# Patient Record
Sex: Female | Born: 1945 | Race: White | Hispanic: No | Marital: Married | Smoking: Former smoker
Health system: Southern US, Community
[De-identification: ages and names within clinical notes are randomized; demographics above are authoritative.]

## PROBLEM LIST (undated history)

## (undated) DIAGNOSIS — N390 Urinary tract infection, site not specified: Secondary | ICD-10-CM

## (undated) DIAGNOSIS — I10 Essential (primary) hypertension: Secondary | ICD-10-CM

## (undated) DIAGNOSIS — F329 Major depressive disorder, single episode, unspecified: Secondary | ICD-10-CM

## (undated) DIAGNOSIS — G4730 Sleep apnea, unspecified: Secondary | ICD-10-CM

## (undated) DIAGNOSIS — K5730 Diverticulosis of large intestine without perforation or abscess without bleeding: Secondary | ICD-10-CM

## (undated) DIAGNOSIS — J449 Chronic obstructive pulmonary disease, unspecified: Secondary | ICD-10-CM

## (undated) DIAGNOSIS — F32A Depression, unspecified: Secondary | ICD-10-CM

## (undated) DIAGNOSIS — I4711 Inappropriate sinus tachycardia, so stated: Secondary | ICD-10-CM

## (undated) DIAGNOSIS — M797 Fibromyalgia: Secondary | ICD-10-CM

## (undated) DIAGNOSIS — G8929 Other chronic pain: Secondary | ICD-10-CM

## (undated) DIAGNOSIS — E119 Type 2 diabetes mellitus without complications: Secondary | ICD-10-CM

## (undated) DIAGNOSIS — I6529 Occlusion and stenosis of unspecified carotid artery: Secondary | ICD-10-CM

## (undated) DIAGNOSIS — K2970 Gastritis, unspecified, without bleeding: Secondary | ICD-10-CM

## (undated) DIAGNOSIS — J189 Pneumonia, unspecified organism: Secondary | ICD-10-CM

## (undated) DIAGNOSIS — I509 Heart failure, unspecified: Secondary | ICD-10-CM

## (undated) DIAGNOSIS — D126 Benign neoplasm of colon, unspecified: Secondary | ICD-10-CM

## (undated) DIAGNOSIS — F419 Anxiety disorder, unspecified: Secondary | ICD-10-CM

## (undated) DIAGNOSIS — K644 Residual hemorrhoidal skin tags: Secondary | ICD-10-CM

## (undated) DIAGNOSIS — M1990 Unspecified osteoarthritis, unspecified site: Secondary | ICD-10-CM

## (undated) DIAGNOSIS — N200 Calculus of kidney: Secondary | ICD-10-CM

## (undated) DIAGNOSIS — I471 Supraventricular tachycardia: Secondary | ICD-10-CM

## (undated) DIAGNOSIS — E1149 Type 2 diabetes mellitus with other diabetic neurological complication: Secondary | ICD-10-CM

## (undated) DIAGNOSIS — A419 Sepsis, unspecified organism: Secondary | ICD-10-CM

## (undated) DIAGNOSIS — I4719 Other supraventricular tachycardia: Secondary | ICD-10-CM

## (undated) DIAGNOSIS — K219 Gastro-esophageal reflux disease without esophagitis: Secondary | ICD-10-CM

## (undated) DIAGNOSIS — E785 Hyperlipidemia, unspecified: Secondary | ICD-10-CM

## (undated) DIAGNOSIS — G709 Myoneural disorder, unspecified: Secondary | ICD-10-CM

## (undated) DIAGNOSIS — D649 Anemia, unspecified: Secondary | ICD-10-CM

## (undated) DIAGNOSIS — R000 Tachycardia, unspecified: Secondary | ICD-10-CM

## (undated) DIAGNOSIS — I480 Paroxysmal atrial fibrillation: Secondary | ICD-10-CM

## (undated) DIAGNOSIS — M549 Dorsalgia, unspecified: Secondary | ICD-10-CM

## (undated) DIAGNOSIS — R51 Headache: Secondary | ICD-10-CM

## (undated) HISTORY — DX: Occlusion and stenosis of unspecified carotid artery: I65.29

## (undated) HISTORY — DX: Dorsalgia, unspecified: M54.9

## (undated) HISTORY — DX: Essential (primary) hypertension: I10

## (undated) HISTORY — DX: Depression, unspecified: F32.A

## (undated) HISTORY — DX: Other chronic pain: G89.29

## (undated) HISTORY — DX: Paroxysmal atrial fibrillation: I48.0

## (undated) HISTORY — DX: Calculus of kidney: N20.0

## (undated) HISTORY — DX: Chronic obstructive pulmonary disease, unspecified: J44.9

## (undated) HISTORY — DX: Anxiety disorder, unspecified: F41.9

## (undated) HISTORY — DX: Unspecified osteoarthritis, unspecified site: M19.90

## (undated) HISTORY — DX: Benign neoplasm of colon, unspecified: D12.6

## (undated) HISTORY — DX: Sleep apnea, unspecified: G47.30

## (undated) HISTORY — DX: Residual hemorrhoidal skin tags: K64.4

## (undated) HISTORY — DX: Anemia, unspecified: D64.9

## (undated) HISTORY — DX: Gastritis, unspecified, without bleeding: K29.70

## (undated) HISTORY — DX: Hyperlipidemia, unspecified: E78.5

## (undated) HISTORY — DX: Diverticulosis of large intestine without perforation or abscess without bleeding: K57.30

## (undated) HISTORY — DX: Sepsis, unspecified organism: A41.9

## (undated) HISTORY — DX: Urinary tract infection, site not specified: N39.0

## (undated) HISTORY — PX: COLONOSCOPY W/ BIOPSIES AND POLYPECTOMY: SHX1376

## (undated) HISTORY — DX: Major depressive disorder, single episode, unspecified: F32.9

## (undated) HISTORY — DX: Pneumonia, unspecified organism: J18.9

## (undated) SURGERY — VIDEO BRONCHOSCOPY WITHOUT FLUORO
Anesthesia: Moderate Sedation

---
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
## 1976-06-02 HISTORY — PX: ABDOMINAL HYSTERECTOMY: SHX81

## 1995-06-03 HISTORY — PX: TOTAL KNEE ARTHROPLASTY: SHX125

## 1999-03-09 ENCOUNTER — Encounter: Payer: Self-pay | Admitting: Emergency Medicine

## 1999-03-09 ENCOUNTER — Emergency Department (HOSPITAL_COMMUNITY): Admission: EM | Admit: 1999-03-09 | Discharge: 1999-03-09 | Payer: Self-pay | Admitting: Emergency Medicine

## 1999-11-21 ENCOUNTER — Encounter: Admission: RE | Admit: 1999-11-21 | Discharge: 1999-11-21 | Payer: Self-pay | Admitting: Family Medicine

## 1999-11-21 ENCOUNTER — Encounter: Payer: Self-pay | Admitting: Family Medicine

## 2001-01-27 ENCOUNTER — Ambulatory Visit (HOSPITAL_BASED_OUTPATIENT_CLINIC_OR_DEPARTMENT_OTHER): Admission: RE | Admit: 2001-01-27 | Discharge: 2001-01-27 | Payer: Self-pay | Admitting: *Deleted

## 2001-07-09 ENCOUNTER — Encounter: Payer: Self-pay | Admitting: Family Medicine

## 2001-07-09 ENCOUNTER — Encounter: Admission: RE | Admit: 2001-07-09 | Discharge: 2001-07-09 | Payer: Self-pay | Admitting: Family Medicine

## 2001-09-05 ENCOUNTER — Encounter: Payer: Self-pay | Admitting: Family Medicine

## 2001-09-05 ENCOUNTER — Encounter: Admission: RE | Admit: 2001-09-05 | Discharge: 2001-09-05 | Payer: Self-pay | Admitting: Family Medicine

## 2003-04-24 ENCOUNTER — Encounter: Admission: RE | Admit: 2003-04-24 | Discharge: 2003-04-24 | Payer: Self-pay | Admitting: Family Medicine

## 2003-10-04 ENCOUNTER — Encounter: Admission: RE | Admit: 2003-10-04 | Discharge: 2003-10-04 | Payer: Self-pay | Admitting: Family Medicine

## 2003-10-10 ENCOUNTER — Encounter: Admission: RE | Admit: 2003-10-10 | Discharge: 2003-10-10 | Payer: Self-pay | Admitting: Family Medicine

## 2004-08-05 ENCOUNTER — Encounter: Admission: RE | Admit: 2004-08-05 | Discharge: 2004-08-05 | Payer: Self-pay | Admitting: Orthopedic Surgery

## 2004-08-20 ENCOUNTER — Encounter: Admission: RE | Admit: 2004-08-20 | Discharge: 2004-08-20 | Payer: Self-pay | Admitting: Orthopedic Surgery

## 2004-09-03 ENCOUNTER — Encounter: Admission: RE | Admit: 2004-09-03 | Discharge: 2004-09-03 | Payer: Self-pay | Admitting: Orthopedic Surgery

## 2005-11-12 ENCOUNTER — Encounter: Admission: RE | Admit: 2005-11-12 | Discharge: 2005-11-12 | Payer: Self-pay | Admitting: Family Medicine

## 2005-12-02 ENCOUNTER — Encounter: Admission: RE | Admit: 2005-12-02 | Discharge: 2006-03-02 | Payer: Self-pay | Admitting: Family Medicine

## 2006-04-06 ENCOUNTER — Ambulatory Visit: Payer: Self-pay | Admitting: Internal Medicine

## 2006-04-09 ENCOUNTER — Ambulatory Visit: Payer: Self-pay | Admitting: Internal Medicine

## 2006-04-09 ENCOUNTER — Encounter (INDEPENDENT_AMBULATORY_CARE_PROVIDER_SITE_OTHER): Payer: Self-pay | Admitting: Specialist

## 2006-04-09 HISTORY — PX: UPPER GASTROINTESTINAL ENDOSCOPY: SHX188

## 2007-04-05 ENCOUNTER — Ambulatory Visit: Payer: Self-pay | Admitting: Internal Medicine

## 2007-04-28 ENCOUNTER — Encounter: Payer: Self-pay | Admitting: Internal Medicine

## 2007-04-28 ENCOUNTER — Ambulatory Visit: Payer: Self-pay | Admitting: Internal Medicine

## 2007-04-28 DIAGNOSIS — Z860100 Personal history of colon polyps, unspecified: Secondary | ICD-10-CM | POA: Insufficient documentation

## 2007-06-14 ENCOUNTER — Ambulatory Visit: Payer: Self-pay | Admitting: Internal Medicine

## 2007-07-22 ENCOUNTER — Ambulatory Visit (HOSPITAL_COMMUNITY): Admission: RE | Admit: 2007-07-22 | Discharge: 2007-07-22 | Payer: Self-pay | Admitting: Gastroenterology

## 2007-07-28 ENCOUNTER — Ambulatory Visit: Payer: Self-pay | Admitting: Internal Medicine

## 2007-07-29 DIAGNOSIS — F329 Major depressive disorder, single episode, unspecified: Secondary | ICD-10-CM

## 2007-07-29 DIAGNOSIS — I10 Essential (primary) hypertension: Secondary | ICD-10-CM

## 2007-07-29 DIAGNOSIS — Z794 Long term (current) use of insulin: Secondary | ICD-10-CM

## 2007-07-29 DIAGNOSIS — E669 Obesity, unspecified: Secondary | ICD-10-CM

## 2007-07-29 DIAGNOSIS — N200 Calculus of kidney: Secondary | ICD-10-CM | POA: Insufficient documentation

## 2007-07-29 DIAGNOSIS — M549 Dorsalgia, unspecified: Secondary | ICD-10-CM

## 2007-07-29 DIAGNOSIS — E119 Type 2 diabetes mellitus without complications: Secondary | ICD-10-CM

## 2007-07-29 DIAGNOSIS — F411 Generalized anxiety disorder: Secondary | ICD-10-CM

## 2007-07-29 DIAGNOSIS — E1149 Type 2 diabetes mellitus with other diabetic neurological complication: Secondary | ICD-10-CM | POA: Insufficient documentation

## 2007-07-29 DIAGNOSIS — M1990 Unspecified osteoarthritis, unspecified site: Secondary | ICD-10-CM

## 2007-07-29 DIAGNOSIS — M810 Age-related osteoporosis without current pathological fracture: Secondary | ICD-10-CM | POA: Insufficient documentation

## 2007-09-06 ENCOUNTER — Inpatient Hospital Stay (HOSPITAL_COMMUNITY): Admission: EM | Admit: 2007-09-06 | Discharge: 2007-09-10 | Payer: Self-pay | Admitting: Emergency Medicine

## 2009-06-25 ENCOUNTER — Encounter: Payer: Self-pay | Admitting: Pulmonary Disease

## 2009-06-29 ENCOUNTER — Encounter: Payer: Self-pay | Admitting: Pulmonary Disease

## 2009-07-25 ENCOUNTER — Encounter: Payer: Self-pay | Admitting: Pulmonary Disease

## 2009-07-26 ENCOUNTER — Ambulatory Visit: Payer: Self-pay | Admitting: Pulmonary Disease

## 2009-07-26 DIAGNOSIS — J309 Allergic rhinitis, unspecified: Secondary | ICD-10-CM | POA: Insufficient documentation

## 2009-07-26 DIAGNOSIS — G4733 Obstructive sleep apnea (adult) (pediatric): Secondary | ICD-10-CM | POA: Insufficient documentation

## 2009-07-26 DIAGNOSIS — R0602 Shortness of breath: Secondary | ICD-10-CM | POA: Insufficient documentation

## 2009-08-13 ENCOUNTER — Ambulatory Visit: Payer: Self-pay | Admitting: Pulmonary Disease

## 2009-08-13 DIAGNOSIS — J438 Other emphysema: Secondary | ICD-10-CM | POA: Insufficient documentation

## 2009-08-29 ENCOUNTER — Encounter: Payer: Self-pay | Admitting: Pulmonary Disease

## 2010-02-22 ENCOUNTER — Ambulatory Visit (HOSPITAL_COMMUNITY): Admission: RE | Admit: 2010-02-22 | Discharge: 2010-02-22 | Payer: Self-pay | Admitting: Internal Medicine

## 2010-06-23 ENCOUNTER — Encounter: Payer: Self-pay | Admitting: Internal Medicine

## 2010-07-04 NOTE — Assessment & Plan Note (Signed)
Summary: rov for emphysema   Copy to:  Sandra Cockayne Primary Provider/Referring Provider:  Sandra Cockayne  CC:  Pt is here for a f/u appt to discuss PFT results.  .  History of Present Illness: the pt comes in today for f/u of her pfts, as part of a w/u for dyspnea.  She was found to have moderate obstructive disease, no restriction, and a severe decrease in dlco that corrects for alveolar volume adjustment.  I have reviewed the study with her in detail, and answered all of her questions.  The pt has not seen a big change in her breathing since being on the spiriva/symbicort combination for a longer period of time.  She denies any congestion or purulent mucus.  Current Medications (verified): 1)  Klor-Con 10 10 Meq Cr-Tabs (Potassium Chloride) .... Take 3 Tabs By Mouth Daily 2)  Lantus 100 Unit/ml Soln (Insulin Glargine) .... 80 Units Each Morning 3)  Metformin Hcl 500 Mg Tabs (Metformin Hcl) .... Take 2 Tabs By Mouth Two Times A Day 4)  Hydrocodone-Acetaminophen 5-500 Mg Tabs (Hydrocodone-Acetaminophen) .Marland Kitchen.. 1 Tab By Mouth Three Times A Day As Needed 5)  Gabapentin 600 Mg Tabs (Gabapentin) .... Take 3 Tabs By Mouth Two Times A Day As Needed 6)  Fluoxetine Hcl 20 Mg Caps (Fluoxetine Hcl) .... Take 3 Tabs By Mouth Each Morning 7)  Diovan 160 Mg Tabs (Valsartan) .... Take 1 Tablet By Mouth Once A Day 8)  Cyclobenzaprine Hcl 10 Mg Tabs (Cyclobenzaprine Hcl) .... Take 1 Tab By Mouth At Bedtime 9)  Chlorthalidone 25 Mg Tabs (Chlorthalidone) .... Take 1 Tablet By Mouth Once A Day 10)  Pravastatin Sodium 40 Mg Tabs (Pravastatin Sodium) .... Take 2  Tabs By Mouth At Bedtime 11)  Omeprazole 20 Mg Cpdr (Omeprazole) .... Take 1 Tab By Mouth At Bedtime 12)  Proair Hfa 108 (90 Base) Mcg/act  Aers (Albuterol Sulfate) .Marland Kitchen.. 1-2 Puffs Every 4-6 Hours As Needed 13)  Symbicort 160-4.5 Mcg/act  Aero (Budesonide-Formoterol Fumarate) .... Inhale 2 Puffs Two Times A Day 14)  Fosamax 70 Mg Tabs  (Alendronate Sodium) .... Take 1 Tab By Mouth Once A Week 15)  Loratadine 10 Mg Tabs (Loratadine) .... Take 1 Tablet By Mouth Once A Day 16)  Calcium 600 Mg Tabs (Calcium) .... Take 1 Tablet By Mouth Two Times A Day 17)  Docusate Sodium 100 Mg Caps (Docusate Sodium) .... Take Two Times A Day 18)  Multivitamins  Tabs (Multiple Vitamin) .... Take 1 Tablet By Mouth Once A Day 19)  Alprazolam 0.5 Mg Tabs (Alprazolam) .... Take 1/2 To 1 Tab By Mouth Once Daily As Needed 20)  Spiriva Handihaler 18 Mcg  Caps (Tiotropium Bromide Monohydrate) .... Inhale Contents of 1 Capsule Once A Day 21)  Apidra Solostar 100 Unit/ml Soln (Insulin Glulisine) .... 8 Units Before A Meal  Allergies (verified): 1)  ! Nsaids 2)  ! Aspirin  Review of Systems      See HPI  Vital Signs:  Patient profile:   65 year old female Height:      67 inches Weight:      244 pounds BMI:     38.35 O2 Sat:      91 % on Room air Temp:     97.9 degrees F oral BP sitting:   130 / 70  (left arm) Cuff size:   regular  Vitals Entered By: Arman Filter LPN (August 13, 2009 1:31 PM)  O2 Flow:  Room air CC:  Pt is here for a f/u appt to discuss PFT results.   Comments Medications reviewed with patient Arman Filter LPN  August 13, 2009 1:38 PM    Physical Exam  General:  obese female in nad   Impression & Recommendations:  Problem # 1:  EMPHYSEMA (ICD-492.8)  the pt has moderate emphysema on her pfts today, and I also suspect that her obesity and deconditioning are also playing roles.  It is unclear if she has a cardiac issue that could be contributing, but will leave that decision to Dr. Clelia Croft.  I have told her she is on the ":gold standard" with respect to medication.  There is really nothing that I would add to her regimen.  Would be happy to see her as needed if there is a change in her condition.    Medications Added to Medication List This Visit: 1)  Pravastatin Sodium 40 Mg Tabs (Pravastatin sodium) .... Take 2  tabs  by mouth at bedtime  Other Orders: Est. Patient Level III (16109)  Patient Instructions: 1)  stay on current meds 2)  work on weight loss and conditioning 3)  followup with me as needed.

## 2010-07-04 NOTE — Assessment & Plan Note (Signed)
Summary: consult for dyspnea   Copy to:  Sandra Cockayne Primary Provider/Referring Provider:  Sandra Cockayne  CC:  Pulmonary Consult.  History of Present Illness: The pt is a 65y/o female who I have been asked to see for dyspnea.  She has had breathing issues for years, but feels they have gotten worse the past few months.  She denies sob at rest, but does have less than one block doe on flat ground at a moderate pace.  She will get winded bringing in one bag of groceries from the car.  She has occasional dry cough, but no ongoing chest congestion.  She wears oxygen at HS, and found to have sat 85% at Dr. Alver Fisher office yesterday with exertion.  She has been on spiriva for one month, and symbicort was added yesterday.  She had a cxr yest. as well, with results not available currently.  The pt has no prior cardiac history, but has not had a screening w/u.  She has mild ankle edema that is intermittant.  Her weight is neutral over the last one year.  She has never had pfts.  Preventive Screening-Counseling & Management  Alcohol-Tobacco     Smoking Status: quit  Current Medications (verified): 1)  Klor-Con 10 10 Meq Cr-Tabs (Potassium Chloride) .... Take 3 Tabs By Mouth Daily 2)  Lantus 100 Unit/ml Soln (Insulin Glargine) .... 80 Units Each Morning 3)  Metformin Hcl 500 Mg Tabs (Metformin Hcl) .... Take 2 Tabs By Mouth Two Times A Day 4)  Hydrocodone-Acetaminophen 5-500 Mg Tabs (Hydrocodone-Acetaminophen) .Marland Kitchen.. 1 Tab By Mouth Three Times A Day As Needed 5)  Gabapentin 600 Mg Tabs (Gabapentin) .... Take 3 Tabs By Mouth Two Times A Day As Needed 6)  Fluoxetine Hcl 20 Mg Caps (Fluoxetine Hcl) .... Take 3 Tabs By Mouth Each Morning 7)  Diovan 160 Mg Tabs (Valsartan) .... Take 1 Tablet By Mouth Once A Day 8)  Cyclobenzaprine Hcl 10 Mg Tabs (Cyclobenzaprine Hcl) .... Take 1 Tab By Mouth At Bedtime 9)  Chlorthalidone 25 Mg Tabs (Chlorthalidone) .... Take 1 Tablet By Mouth Once A Day 10)   Pravastatin Sodium 40 Mg Tabs (Pravastatin Sodium) .... Take 1 Tab By Mouth At Bedtime 11)  Omeprazole 20 Mg Cpdr (Omeprazole) .... Take 1 Tab By Mouth At Bedtime 12)  Proair Hfa 108 (90 Base) Mcg/act  Aers (Albuterol Sulfate) .Marland Kitchen.. 1-2 Puffs Every 4-6 Hours As Needed 13)  Symbicort 160-4.5 Mcg/act  Aero (Budesonide-Formoterol Fumarate) .... Inhale 2 Puffs Two Times A Day 14)  Fosamax 70 Mg Tabs (Alendronate Sodium) .... Take 1 Tab By Mouth Once A Week 15)  Loratadine 10 Mg Tabs (Loratadine) .... Take 1 Tablet By Mouth Once A Day 16)  Calcium 600 Mg Tabs (Calcium) .... Take 1 Tablet By Mouth Two Times A Day 17)  Docusate Sodium 100 Mg Caps (Docusate Sodium) .... Take Two Times A Day 18)  Multivitamins  Tabs (Multiple Vitamin) .... Take 1 Tablet By Mouth Once A Day 19)  Alprazolam 0.5 Mg Tabs (Alprazolam) .... Take 1/2 To 1 Tab By Mouth Once Daily As Needed 20)  Spiriva Handihaler 18 Mcg  Caps (Tiotropium Bromide Monohydrate) .... Inhale Contents of 1 Capsule Once A Day 21)  Apidra Solostar 100 Unit/ml Soln (Insulin Glulisine) .... 8 Units Before A Meal  Allergies (verified): 1)  ! Nsaids 2)  ! Aspirin  Past History:  Past Medical History:  ALLERGIC RHINITIS (ICD-477.9) OBSTRUCTIVE SLEEP APNEA (ICD-327.23) COLONIC POLYPS, ADENOMATOUS (ICD-211.3) GASTRITIS (ICD-535.50)  EXTERNAL HEMORRHOIDS (ICD-455.3) DIVERTICULOSIS, COLON (ICD-562.10) DIABETES MELLITUS (ICD-250.00) DIABETIC PERIPHERAL NEUROPATHY (ICD-250.60) DEPRESSION (ICD-311) OSTEOPOROSIS (ICD-733.00) OBESITY (ICD-278.00) NEPHROLITHIASIS (ICD-592.0) BACK PAIN, CHRONIC (ICD-724.5) OSTEOARTHRITIS (ICD-715.90) ANXIETY (ICD-300.00) HYPERTENSION (ICD-401.9)    Past Surgical History: Appendectomy  1962 Cholecystectomy 1962 Hysterectomy 1971 R  knee replacement 1997  Family History: Reviewed history and no changes required. heart disease: mother, father, brother clotting disorder: mother (stroke) brother (stroke)  cancer:  brother (skin)   Social History: Reviewed history and no changes required. Patient states former smoker.  started at age 32.  2 ppd.  quit 1996. pt is married. pt has children. pt is on disability.  prev worked at [employer].  Smoking Status:  quit  Review of Systems       The patient complains of shortness of breath with activity, shortness of breath at rest, productive cough, non-productive cough, indigestion, difficulty swallowing, tooth/dental problems, nasal congestion/difficulty breathing through nose, depression, hand/feet swelling, joint stiffness or pain, and change in color of mucus.  The patient denies coughing up blood, chest pain, irregular heartbeats, acid heartburn, loss of appetite, weight change, abdominal pain, sore throat, headaches, sneezing, itching, ear ache, anxiety, rash, and fever.    Vital Signs:  Patient profile:   65 year old female Height:      67 inches Weight:      246.25 pounds BMI:     38.71 O2 Sat:      94 % on Room air Temp:     98.1 degrees F oral Pulse rate:   104 / minute BP sitting:   120 / 62  (left arm) Cuff size:   regular  Vitals Entered By: Arman Filter LPN (July 26, 2009 1:31 PM)  O2 Flow:  Room air CC: Pulmonary Consult Comments Medications reviewed with patient Arman Filter LPN  July 26, 2009 1:32 PM    Physical Exam  General:  obese female in nad Eyes:  PERRLA and EOMI.   Nose:  patent without discharge Mouth:  clear Neck:  no jvd, tmg, LN Lungs:  minimal basilar crackles, no wheezing or rhonchi Heart:  rrr, no mrg Abdomen:  soft and nontender, bs+ Extremities:  no significant edema, pulses intact distally no cyanosis Neurologic:  alert and oriented, moves all 4.   Impression & Recommendations:  Problem # 1:  DYSPNEA (ICD-786.05) the pt has progressive doe that I suspect is due to obstructive lung disease.  She has a longstanding history of tobacco abuse, but at least quit 79yrs ago.  At this point, I  think she needs full pfts to establish her underlying process.  I have reviewed with her the various causes of sob...Marland Kitchenpulmonary and cardiac issues, deconditioning, obesity, and misc (anemia, hypothyr, meds).  If her pft abnormalities are out of proportion to her symptoms, she may need a cardiac w/u as well.  I have asked her to continue with her current regimen, and will see her back same day as pfts to review with her.  Medications Added to Medication List This Visit: 1)  Klor-con 10 10 Meq Cr-tabs (Potassium chloride) .... Take 3 tabs by mouth daily 2)  Lantus 100 Unit/ml Soln (Insulin glargine) .... 80 units each morning 3)  Metformin Hcl 500 Mg Tabs (Metformin hcl) .... Take 2 tabs by mouth two times a day 4)  Hydrocodone-acetaminophen 5-500 Mg Tabs (Hydrocodone-acetaminophen) .Marland Kitchen.. 1 tab by mouth three times a day as needed 5)  Gabapentin 600 Mg Tabs (Gabapentin) .... Take 3 tabs by mouth two times a day  as needed 6)  Fluoxetine Hcl 20 Mg Caps (Fluoxetine hcl) .... Take 3 tabs by mouth each morning 7)  Diovan 160 Mg Tabs (Valsartan) .... Take 1 tablet by mouth once a day 8)  Cyclobenzaprine Hcl 10 Mg Tabs (Cyclobenzaprine hcl) .... Take 1 tab by mouth at bedtime 9)  Chlorthalidone 25 Mg Tabs (Chlorthalidone) .... Take 1 tablet by mouth once a day 10)  Pravastatin Sodium 40 Mg Tabs (Pravastatin sodium) .... Take 1 tab by mouth at bedtime 11)  Omeprazole 20 Mg Cpdr (Omeprazole) .... Take 1 tab by mouth at bedtime 12)  Proair Hfa 108 (90 Base) Mcg/act Aers (Albuterol sulfate) .Marland Kitchen.. 1-2 puffs every 4-6 hours as needed 13)  Symbicort 160-4.5 Mcg/act Aero (Budesonide-formoterol fumarate) .... Inhale 2 puffs two times a day 14)  Fosamax 70 Mg Tabs (Alendronate sodium) .... Take 1 tab by mouth once a week 15)  Loratadine 10 Mg Tabs (Loratadine) .... Take 1 tablet by mouth once a day 16)  Calcium 600 Mg Tabs (Calcium) .... Take 1 tablet by mouth two times a day 17)  Docusate Sodium 100 Mg Caps (Docusate  sodium) .... Take two times a day 18)  Multivitamins Tabs (Multiple vitamin) .... Take 1 tablet by mouth once a day 19)  Alprazolam 0.5 Mg Tabs (Alprazolam) .... Take 1/2 to 1 tab by mouth once daily as needed 20)  Spiriva Handihaler 18 Mcg Caps (Tiotropium bromide monohydrate) .... Inhale contents of 1 capsule once a day 21)  Apidra Solostar 100 Unit/ml Soln (Insulin glulisine) .... 8 units before a meal  Other Orders: Consultation Level IV (40102) Pulmonary Referral (Pulmonary)  Patient Instructions: 1)  will schedule for breathing studies, and see you  back on same day to discuss 2)  no change in breathing meds for now  Appended Document: consult for dyspnea megan, please see if Dr. Alver Fisher office has cxr report for this pt from 07/25/09.  thanks  Appended Document: consult for dyspnea LMOM  for medical records at [facility] requesting cxr report.   Appended Document: consult for dyspnea received cxr report.  put in [facility] very important look at folder for him to review.

## 2010-07-04 NOTE — Miscellaneous (Signed)
Summary: Orders Update pft charges  Clinical Lists Changes  Orders: Added new Service order of Carbon Monoxide diffusing w/capacity (94720) - Signed Added new Service order of Lung Volumes (94240) - Signed Added new Service order of Spirometry (Pre & Post) (94060) - Signed 

## 2010-09-10 ENCOUNTER — Encounter: Payer: Self-pay | Admitting: Pulmonary Disease

## 2010-09-11 ENCOUNTER — Ambulatory Visit (INDEPENDENT_AMBULATORY_CARE_PROVIDER_SITE_OTHER): Payer: Medicare Other | Admitting: Pulmonary Disease

## 2010-09-11 ENCOUNTER — Encounter: Payer: Self-pay | Admitting: Pulmonary Disease

## 2010-09-11 DIAGNOSIS — J449 Chronic obstructive pulmonary disease, unspecified: Secondary | ICD-10-CM

## 2010-09-11 DIAGNOSIS — R0602 Shortness of breath: Secondary | ICD-10-CM

## 2010-09-11 DIAGNOSIS — R918 Other nonspecific abnormal finding of lung field: Secondary | ICD-10-CM

## 2010-09-11 DIAGNOSIS — J432 Centrilobular emphysema: Secondary | ICD-10-CM | POA: Insufficient documentation

## 2010-09-11 NOTE — Progress Notes (Signed)
  Subjective:    Patient ID: Destiny Shelton, female    DOB: 1945/12/18, 65 y.o.   MRN: [id number]  HPI The pt comes in today for an abnormal ct chest.  She has known moderate emphysema by pfts in the past, and is on supplemental oxygen and an aggressive bronchodilator regimen.  I have not seen her in a year.  She has a h/o pulmonary nodules dating back to ct 01/2010, and recently had a f/u which showed the <= 5mm nodules were mostly stable.  However, there was a new nodule in RLL, and ?density in lower lobes?  She denies chest congestion or purulent mucus to me, although in CC she mentions this.  She does have a cough, but denies feeling she has a chest cold.  She tells me that her health maintenance is up to date.    Review of Systems  Constitutional: Negative for fever and unexpected weight change.  HENT: Positive for ear pain, nosebleeds, congestion, sneezing and trouble swallowing. Negative for sore throat, rhinorrhea, dental problem, postnasal drip and sinus pressure.   Eyes: Negative for redness and itching.  Respiratory: Positive for cough, choking, chest tightness, shortness of breath and wheezing.   Cardiovascular: Positive for palpitations and leg swelling.  Gastrointestinal: Positive for nausea. Negative for vomiting.  Genitourinary: Positive for dysuria.  Musculoskeletal: Positive for joint swelling.  Skin: Negative for rash.  Neurological: Positive for headaches.  Hematological: Bruises/bleeds easily.  Psychiatric/Behavioral: Positive for dysphoric mood. The patient is nervous/anxious.        Objective:   Physical Exam Obese female in nad Nares without d/c or purulence Chest with decreased bs, no wheezing or rhonchi Cor with mild tachy, regular, no mrg LE with no edema, no cyanosis  Alert and oriented, moves all 4        Assessment & Plan:

## 2010-09-11 NOTE — Patient Instructions (Signed)
Please bring ct scan disk by the office for my review.  I will call you with my findings.

## 2010-09-13 ENCOUNTER — Telehealth: Payer: Self-pay | Admitting: Pulmonary Disease

## 2010-09-13 NOTE — Telephone Encounter (Signed)
Pt dropped of 2 discs from [facility] for her CT Chest dated 08/20/2010 and 02/21/2010. Put in Kc's very important look at folder.

## 2010-09-14 NOTE — Assessment & Plan Note (Signed)
The pt has known moderate emphysema by her pfts last year, and is on a very aggressive treatment regimen.  There is really nothing further to do from a pulmonary standpoint.

## 2010-09-14 NOTE — Assessment & Plan Note (Signed)
Her degree of doe and oxygen requirements have always been out of proportion to her objective pulmonary findings.  It appears there is not a cardiac explanation for her symptoms.  It is possible her PFT's are underestimating her degree of airflow obstruction.

## 2010-09-14 NOTE — Assessment & Plan Note (Addendum)
The pt has multiple small nodules on f/u ct from 01/2010 that are essentially unchanged by the report, although she does have a new nodule as well in the RLL.  ?ASDZ is also mentioned.  I do not have the ct available for review, and the pt will get me a disk with her 2 CT's so I can render an opinion.  She tells me her health maintenance is up to date, and I suspect the nodules are inflammatory in nature.  They do need to be followed however.  Will call pt once I review her disks to discuss further recommendations.

## 2010-09-19 ENCOUNTER — Encounter: Payer: Self-pay | Admitting: Pulmonary Disease

## 2010-09-19 NOTE — Progress Notes (Signed)
I have reviewed ct chest from [facility].  She has scattered nodules that appear unchanged from last year, but does have a new tiny nodule in RLL that needs f/u.  She has a lot of scarring in the lungs, and the ?pneumonitis in RML appears mainly due to scarring and some motion artifact.  She does have a focus of hazy ASDZ in LLL medially, but this appears more inflammatory to me and is focal in nature.  All in all I think she needs a f/u ct chest in 6mos .  I have called pt multiple time, with line busy each time.

## 2010-09-24 NOTE — Progress Notes (Signed)
Called and spoke with pt.  Pt aware of KC's recs and will call in Sept to set up f/u ct appt.

## 2010-09-30 ENCOUNTER — Encounter: Payer: Self-pay | Admitting: Pulmonary Disease

## 2010-10-15 NOTE — Assessment & Plan Note (Signed)
[facility]                         GASTROENTEROLOGY OFFICE NOTE   Destiny Shelton, Destiny Shelton                      MRN:          [id number]  DATE:06/14/2007                            DOB:          1945/11/16    CHIEF COMPLAINT:  Probable polyps.   HISTORY:  At colonoscopy on April 28, 2007, she had a residual cecal  polyp that I snared again and a new large sigmoid polyp.  The site was  tattooed.  She also had diverticulosis.  The polyps were benign, though  there was a focus of high-grade dysplasia in the sigmoid polyp.  She is  here to discuss the next step, i.e. further colonoscopies or a surgical  resection.   MEDICAL DECISION MAKING:  1. Are listed and reviewed in the chart and include potassium chloride      30 mEq daily.  2. Lantus insulin 80 units every morning.  3. Metformin 500 mg, two twice daily.  4. Hydrocodone/APAP 5/500 mg three times daily.  5. Gabapentin 600 mg twice daily.  6. Fluoxetine 20 mg, three every morning.  7. Diovan 160 mg, one each morning.  8. Cyclobenzaprine 10 mg at bedtime.  9. Chlorthalidone 25 mg each morning.  10.Pravastatin 40 mg at bedtime.  11.Omeprazole 20 mg at bedtime.  12.ProAir HFA two to four puffs daily.  13.Flovent HFA two twice daily.  14.Fosamax 70 mg weekly.  15.Loratadine 10 mg daily.  16.Calcium 600 mg q.a.m. and q. Evening.  17.Stool softener two twice daily.  18.Multivitamin daily.  19.Xanax 0.5 mg, 1/2 daily.   ALLERGIES:  ANTI-INFLAMMATORIES/NSAIDs cause breathing difficulties.   PAST MEDICAL HISTORY:  1. Colon polyps.  2. Diverticulosis.  3. Obesity.  4. Diabetes mellitus type 2.  5. Hypertension.  6. Depression.  7. Nephrolithiasis.  8. Prior cholecystectomy.  9. Prior hysterectomy  10.Prior appendectomy.  11.Diabetic neuropathy.  12.Chronic back pain.  13.Osteoporosis.  14.Anxiety.  15.Osteoarthritis.  16.Degenerative disease of the spine, hips and knees.  17.Reflux  esophagitis with esophageal spasm.  18.Esophagoduodenostomy with septal dilation in November 2007.  No      recurrent dysphagia.  Also had gastritis.   PHYSICAL EXAMINATION:  GENERAL:  An obese pleasant white woman, in no  acute distress.  VITAL SIGNS:  Height 5 feet 7 inches, weight 247 pounds, pulse 100,  blood pressure 120/72.   ASSESSMENT:  Recurrent and persistent colon polyps.  She is obese.  She  is a diabetic.  She is at higher risk for surgical complications, in my  opinion.  We discussed the pros and cons of a colonoscopic approach,  versus a surgical approach.   PLAN:  Pursue colonoscopy in February, to try to remove residual polyp  and reassess.  Argon plasma coagulator will be available.  She did not  respond well to the moderate sedation, so we will have anesthesia  deliver propofol anesthesia for this lady.  Further plans pending this  course.  She knows that she will need at least two colonoscopies in  2009, to try to eradicate these polyps if we can.  She also understands  that we  may need to revert to a surgical approach at some point.     Iva Boop, MD,FACG  Electronically Signed    CEG/MedQ  DD: 06/14/2007  DT: 06/14/2007  Job #: [id number]   cc:   Mosetta Putt, M.D.

## 2010-10-15 NOTE — H&P (Signed)
NAME:  Destiny Shelton, Destiny Shelton NO.:  [id number]   MEDICAL RECORD NO.:  [id number]          PATIENT TYPE:  INP   LOCATION:  5501                         FACILITY:  [facility]   PHYSICIAN:  Beckey Rutter, MD  DATE OF BIRTH:  [birth year]   DATE OF ADMISSION:  09/06/2007  DATE OF DISCHARGE:                              HISTORY & PHYSICAL   CHIEF COMPLAINT:  Shortness of breath and back pain.   HISTORY OF PRESENT ILLNESS:  This is a 65 year old pleasant Caucasian  female with past medical history significant for COPD, chronic back  pain, degenerative disk disease, depression, diabetes and diabetic  neuropathy, hypertension, osteoporosis and high cholesterol came in  today because of shortness of breath.  The symptoms started this  morning, associated with left sided back pain.  The patient has a cough, nonproductive since morning.  She denied  significant fever but she described left posterior chest pain worse with  cough.  The patient is known to have history of bronchial asthma and  chronic bronchitis but she is not taking oxygen at home.   PAST MEDICAL HISTORY:  1. History of diabetes type 2 with diabetic neuropathy.  2. Gastroesophageal reflux disease.  3. Hyperlipidemia.  4. Chronic obstructive pulmonary disease with bronchitis and history      of bronchial asthma since childhood.  5. Chronic back pain.  6. Degenerative disk disease.  7. Depression.  8. History of kidney stones.   SOCIAL HISTORY:  No drug abuse, nondrinker, nonsmoker.  Lives at home  with her spouse.   FAMILY HISTORY:  Noncontributory.   MEDICATION ALLERGIES:  NONSTEROIDAL ANTI-INFLAMMATORY MEDICATION.   MEDICATIONS:  1. Potassium chloride 10 mEq three times a day.  2. Lantus 80 mg in the morning.  3. Metformin 1000 b.i.d.  4. Vicodin one tab t.i.d.  5. Neurontin 600 mg b.i.d. p.r.n.  6. Fluoxetine 20 mg 3 capsules every morning.  7. Diovan 160 one tab in the morning.  8. Cyclobenzaprine 10  mg at bedtime.  9. Chlorthalidone 25 mg p.o.  10.Pravastatin 40 mg bedtime.  11.Omeprazole 20 mg bedtime.  12.ProAir Albuterol inhaler four times p.r.n.  13.Flovent 220 micro inhaler two puffs twice a day.  14.Fosamax 70 mg weekly.  15.Loratadine 10 mg daily.  16.Calcium 600 mg daily.  17.Stool softener, Docusate 100 mg two times a day.  18.Multivitamins daily.  19.Xanax 0.5 mg to take to half tab as needed q. 8 hours.   REVIEW OF SYSTEMS:  A 12 point review of systems is noncontributory.   PHYSICAL EXAMINATION:  VITAL SIGNS:  Her temperature was 98.0, pulse of  122, respiratory rate is 28.  HEENT:  Head atraumatic, normocephalic.  Eyes: PERRL.  Mouth moist.  No  ulcer.  NECK:  Supple.  No JVD.  LUNGS: Bilateral scattered wheezing and rhonchi.  The patient has  pleural rubs on bibasilar auscultation.  PRECORDIUM:  Distant heart sound.  First and second audible.  ABDOMEN:  Obese, nontender.  Bowel sounds present.  EXTREMITIES: No lower extremity edema.  NEUROLOGICAL:  Alert and oriented times three, moving all her  extremities  spontaneously.   LABS AND X-RAYS:  Microscopic urine showing few bacteria with white  blood count three to six, nitrate is negative, leukocyte esterase is  moderate.  Sodium is 133, potassium 3.6, chloride 97, bicarb 24, glucose  129, BUN 9, creatinine 0.75.  Estimated GFR more than 60, PTT 33.  PT is  13.3, INR is 1.0.  White blood count 17.4 with neutrophil percentage  82%.  Hemoglobin 12.7, hematocrit 38.1 and platelet count is 261.  Chest  x-ray was showing severe bronchitis and potential developing bibasilar  infiltrates.  There also might be a component of mild interstitial  edema.   ASSESSMENT AND PLAN:  1. This is a 65 year old female with multiple medical problems who      came today with picture of COPD exacerbation and pneumonia. Plan:      1.  The patient will be admitted for further assessment and      management. 2.  The patient will be  started on antibiotic, Rocephin      and Zithromax for bibasilar pneumonia and bronchitis picture. 3.      The patient will have one dose of Solu-Medrol for the COPD and we      will reevaluate the wheezes and continue the steroid dependent on      the assessment.  2. Posterior left-sided chest pain.  This is likely secondary to the      continuous cough the patient is experiencing.  Nevertheless, will      rule out the patient for coronary syndromes with serial cardiac      enzymes.  3. Diabetes.  The patient will be continued on Lantus and sliding      scale during the hospital stay.  The patient will be continued on      oral hypoglycemic as well.  Hemoglobin A1c will be checked and      carbohydrate diet will be reinstated.  4. Diabetic nephropathy. The patient will be continued on Neurontin      p.r.n. basis as well as Vicodin.  5. Hypertension. Blood pressure will be monitored.  The patient is on      Chlorthalidone.  Will continue if the blood      pressure indicates elevation of blood pressure. Currently the blood      pressure is not significantly elevated and we will hold the      antihypertensive medication.  6. For DVT prophylaxis I will start Lovenox.  For GI prophylaxis,      Protonix.      Beckey Rutter, MD  Electronically Signed     EME/MEDQ  D:  09/06/2007  T:  09/07/2007  Job:  [id number]   cc:   Mosetta Putt, M.D.

## 2010-10-15 NOTE — Assessment & Plan Note (Signed)
[facility]                         GASTROENTEROLOGY OFFICE NOTE   Destiny Shelton, Destiny Shelton                      MRN:          [id number]  DATE:04/05/2007                            DOB:          [birth year]    CHIEF COMPLAINT:  Followup.  Has diabetes.   [name] had a colonoscopy last year.  I removed a 1-cm cecal polyp  and a 12-mm descending colon polyp.  I thought the cecal polyp might  have been a recurrence of a large polyp in the past.  She is back for a  1-year visit.  She takes insulin so she is in the office for management.  She has no bowel symptoms.  She is otherwise stable other than a trigger  finger repair which was performed this year on the left finger, but it  was unsuccessful.  She plans to repeat that again in the future.   MEDICATIONS:  Listed and reviewed in the chart.  Of note, she is on  Lantus 80 units morning, metformin 500 mg 2 twice a day.   PAST MEDICAL HISTORY:  1. Colon polyps.  2. Diverticulosis.  3. Obesity.  4. Diabetes mellitus type 2.  5. Hypertension.  6. Depression.  7. Nephrolithiasis.  8. Prior cholecystectomy.  9. Prior hysterectomy.  10.Prior appendectomy.  11.Diabetic neuropathy.  12.Chronic back pain.  13.Osteoporosis.  14.Anxiety.  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  16.Reflux esophagitis with esophageal spasm.  17.Esophagogastroduodenoscopy with esophageal dilation, November 2007,      no recurrent dysphagia.  She was dilated for dysphagia.  She had      gastritis as well.   PHYSICAL EXAMINATION:  Shows her to be in no acute distress.  She is obese with vital signs of weight 248 pounds, height 5 feet 7  inches, pulse 118, blood pressure 136/76.  The neck is short and supple.  LUNGS:  Clear.  HEART:  S1, S2, no murmurs, rubs, or gallops.  She is not tachycardic  with a pulse of 90 at the time of the exam.  The previous pulse was  taken during  vitals presenting to the room after walking down the hall.  The abdomen is obese.  The skin is warm and dry.  She is alert and oriented x3.   ASSESSMENT:  1. Personal history of colon polyps, multiple, with recurrence.  2. Diabetes mellitus type 2.   PLAN:  Schedule for colonoscopy for polyp surveillance.  This will be  performed in the morning.  She will hold her Lantus in the morning and  hold her morning metformin as well.  Further plans pending that.   Risks, benefits and indications are understood.     Iva Boop, MD,FACG  Electronically Signed    CEG/MedQ  DD: 04/05/2007  DT: 04/05/2007  Job #: [id number]   cc:   Mosetta Putt, M.D.

## 2010-10-15 NOTE — Discharge Summary (Signed)
NAMEERNIE, SAGRERO NO.:  [id number]   MEDICAL RECORD NO.:  [id number]          PATIENT TYPE:  INP   LOCATION:  5501                         FACILITY:  [facility]   PHYSICIAN:  Michaelyn Barter, M.D. DATE OF BIRTH:  1946/02/19   DATE OF ADMISSION:  09/06/2007  DATE OF DISCHARGE:  09/10/2007                               DISCHARGE SUMMARY   PRIMARY CARE PHYSICIAN:  Mosetta Putt, M.D.   FINAL DIAGNOSES:  1. COPD exacerbation.  2. Pneumonia, probable bronchitis.  3. Diabetes mellitus with uncontrolled sugars.  4. Leukocytosis.  5. Interstitial edema.   PROCEDURES.:  Two-view chest x-rays completed on April 6 and 9th.   HISTORY OF PRESENT ILLNESS:  Ms. Ralphs is a 65 year old female who  arrived with a chief complaint of shortness of breath and back pain,  stated that her symptoms started on the morning of admission.  She also  complained of a nonproductive cough.  There were no complaints of  fevers.   PAST MEDICAL HISTORY:  As dictated by Dr. Tamsen Roers.   HOSPITAL COURSE:  1. Pneumonia/bronchitis.  The chest x-ray was completed April 6 which      revealed severe bronchitis as well as probable bibasilar      infiltrates.  It was noted that the patient may have also been      developing interstitial edema.  However, no pleural effusions were      identified.  Because of the severe bronchitis and developing      bibasilar infiltrates, the patient has been treated as a      pneumonia/severe bronchitis type of picture.  She has been started      on empiric IV Rocephin and azithromycin.  Nebulized breathing      treatments have been provided to the patient.  Over the course of      hospitalization, she indicated that her breathing has improved      significantly and her shortness of breath has resolved.  A repeat      chest x-ray was completed on April 9.  The results of which      revealed slight improvement in aeration, peribronchial thickening      secondary  to chronic bronchitis, a component of mild pulmonary      edema could possibly be present, but the radiologist indicated that      he felt that this was less likely, given the fact that there were      no pleural effusions or septal thickening seen.  Improved bibasilar      aeration with a probable left base atelectasis remaining.  The      radiologist recommended follow-up radiographic studies to exclude      residual pneumonia.  2. Diabetes mellitus.  The patient's sugars have been elevated      throughout her hospital course.  She was restarted on her home      regimen contributed to her elevated sugars.  It will be suggested      to the patient that she follows up with her primary care doctor for  further treatment of her hyperglycemia  3. Leukocytosis.  This may have been related to the patient's      respiratory infection as well as a contribution from her steroids.      The patient's leukocytosis has improved.  However, not completely      resolved.  Again, she can follow up with her primary care doctor      regarding this.  4. Interstitial edema, depicted on chest x-ray.  Lasix was provided to      the patient during the course of his hospitalization.  Again, her      respiratory status appears to have improved.  5. Chronic back pain.  The patient's complaints regarding pain was      minimal during the course of this hospitalization.   CONDITION ON THE DAY OF DISCHARGE:  On the date of discharge, the  patient has indicated that she wants to be discharged home today.   DISCHARGE VITAL SIGNS:  Temperature is 98.2, heart rate 100,  respirations 20, blood pressure 130/84, O2 sat is 92% on room air.   DISCHARGE MEDICATIONS:  1. Prednisone tapering Dosepak.  2. Combivent MDI 2 puffs q.6 h.  3. Avelox 400 mg p.o. daily.  4. Flexeril 10 mg p.o. q.h.s.  5. Colace 1 mg p.o. b.i.d.  6. Prozac 60 mg p.o. daily.  7. Neurontin 600 mg p.o. b.i.d.  8. Claritin 20 mEq p.o. daily.  9.  Zocor 20 mg p.o. daily.   FOLLOWUP:  She will be told to call Dr. Duaine Dredge for follow-up  appointment within the next 1-2 weeks.      Michaelyn Barter, M.D.  Electronically Signed     OR/MEDQ  D:  09/10/2007  T:  09/10/2007  Job:  [id number]   cc:   Mosetta Putt, M.D.

## 2010-10-18 NOTE — Op Note (Signed)
[place]  Patient:    Destiny Shelton, Destiny Shelton Visit Number: [id number] MRN: [id number]          Service Type: DSU Location: [facility] Attending Physician:  Kendell Bane Dictated by:   Lowell Bouton, M.D. Proc. Date: 01/27/01 Adm. Date:  01/27/2001   CC:         Carolyne Fiscal, M.D.   Operative Report  PREOPERATIVE DIAGNOSIS: Right carpal tunnel syndrome.  POSTOPERATIVE DIAGNOSIS: Right carpal tunnel syndrome.  OPERATION/PROCEDURE: Decompression of median nerve, right carpal tunnel.  SURGEON: Lowell Bouton, M.D.  ANESTHESIA: Marcaine 0.50% local with sedation.  OPERATIVE FINDINGS: The patient had a very narrow carpal canal.  There were no masses present.  The motor branch was intact.  DESCRIPTION OF PROCEDURE: Under Marcaine 0.50% local anesthesia, with a tourniquet on the right arm, the right hand was prepped and draped in the usual fashion and after exsanguinating the limb the tourniquet was inflated to 250 mm HG.  A longitudinal incision was made in the palm just ulnar to the thenar crease measuring 3 cm in length.  Blunt dissection was carried through the superficial palmar fascia down to the transverse carpal ligament.  A hemostat was placed under the transverse carpal ligament and up against the hook of the hamate.  The transverse carpal ligament was then divided on the ulnar border of the median nerve.  The proximal end of the ligament was divided with scissors after dissecting the nerve away from the undersurface of the ligament.  The nerve was then examined and the motor branch identified. The carpal canal was then palpated and was found to be adequately decompressed.  The wound was irrigated with saline and the skin was closed with 4-0 nylon suture.  Sterile dressings were applied followed by a volar wrist splint.  The patient tolerated the procedure well and went to the recovery room awake and  stable, in good condition. Dictated by:   Lowell Bouton, M.D. Attending Physician:  Kendell Bane DD:  01/27/01 TD:  01/28/01 Job: [id number]

## 2010-10-18 NOTE — Assessment & Plan Note (Signed)
HEALTHCARE                           GASTROENTEROLOGY OFFICE NOTE   Destiny Shelton, Destiny Shelton                      MRN:          [id number]  DATE:04/06/2006                            DOB:          Jan 04, 1946    CHIEF COMPLAINT:  Followup on polyps.   HISTORY:  Destiny Shelton returns. 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 She had a very large  cecal polyp at her first colonoscopy. She is now on insulin so she is seeing  me. Taking 50 units every morning. In addition, she is complaining of  intermittent solid food dysphagia, worse over the last year. She takes  Omeprazole 20 mg at bedtime because most of her symptoms are at night.   She did not describe any bleeding. She has bloating and constipation at  times. She still has some heartburn problems despite the Omeprazole.   MEDICATIONS:  1. Potassium chloride 10 mEq daily.  2. Lantus insulin 50 units every morning.  3. Metformin 500 mg 2 tablets twice daily.  4. Hydrocodone APAP 1 t.i.d. p.r.n.  5. Gabapentin 500 mg 1/2 to 1 tab 4 times daily as needed.  6. Fluoxetine 20 mg 3 capsules every morning (60 mg).  7. Cozaar 100 mg daily.  8. Cyclobenzaprine 10 mg t.i.d.  9. Chlorthalidone 25 mg 1 tablet every morning.  10.Vytorin 10/10 one tablet daily.  11.Omeprazole 20 mg at bedtime.  12.Albuterol 90 mcg 2 puffs 4 times daily.  13.Flovent 220 mcg 2 puffs b.i.d.  14.Fosamax 75 mg weekly.  15.AllerClear.  16.Loratadine 10 mg daily.  17.Calcium 600 mg b.i.d.  18.Stool softener 3 times a day.  19.Multivitamin one a day.   DRUG ALLERGIES:  NSAIDs cause dyspnea.   PAST MEDICAL HISTORY:  1. Appendectomy.  2. Right knee replacement.  3. Hysterectomy.  4. Cholecystectomy.  5. Obesity.  6. COPD.  7. Spinal stenosis.  8. Diabetes mellitus type 2 on insulin.  9.  Gastroesophageal reflux disease.  10.Dyslipidemia.  11.Osteoporosis.  12.Degenerative joint disease of the spine, hips and knees.  13.Hypertension.  14.Depression.   PHYSICAL EXAMINATION:  GENERAL:  She is an obese, middle-aged, white female  in no acute distress.  VITAL SIGNS:  Height 5 feet 7, weight 232 pounds, blood pressure 110/60,  pulse 68.  LUNGS:  Clear.  HEART:  S1, S2, no rubs or gallops.  ABDOMEN:  Obese.  NEUROLOGIC:  She is alert and oriented x3.   ASSESSMENT:  1. Personal history of colon polyps, last colonoscopy 2 years ago.  2. Intermittent solid food dysphagia and reflux problems.   PLAN:  Schedule upper GI endoscopy with dilation, also schedule colonoscopy.  Will hold her insulin in the morning during the procedure. I think given the  fact that she is on 50 units of insulin, we can continue her metformin  without problems. She is going to have an early afternoon procedure. It  should be okay even though she is a diabetic because we are  holding her  entire insulin dose.     Destiny Boop, MD,FACG  Electronically Signed    CEG/MedQ  DD: 04/06/2006  DT: 04/07/2006  Job #: [id number]

## 2010-12-17 ENCOUNTER — Telehealth: Payer: Self-pay | Admitting: Pulmonary Disease

## 2010-12-17 DIAGNOSIS — R918 Other nonspecific abnormal finding of lung field: Secondary | ICD-10-CM

## 2010-12-17 NOTE — Telephone Encounter (Signed)
noncontrast ct in SEPT to f/u pulmonary nodules.  Not before.  thanks

## 2010-12-17 NOTE — Telephone Encounter (Signed)
Per last documentation note on 09/19/10 the pt was to have a f/u CT in September. The pt is calling to schedule this. Please advise if it is to be with or without contrast? Please advise. Carron Curie, CMA

## 2010-12-17 NOTE — Telephone Encounter (Signed)
Order placed. Jennifer Castillo, CMA  

## 2011-01-14 ENCOUNTER — Encounter (INDEPENDENT_AMBULATORY_CARE_PROVIDER_SITE_OTHER): Payer: Medicare Other | Admitting: Ophthalmology

## 2011-01-14 DIAGNOSIS — E11319 Type 2 diabetes mellitus with unspecified diabetic retinopathy without macular edema: Secondary | ICD-10-CM

## 2011-01-14 DIAGNOSIS — H43819 Vitreous degeneration, unspecified eye: Secondary | ICD-10-CM

## 2011-01-14 DIAGNOSIS — H2510 Age-related nuclear cataract, unspecified eye: Secondary | ICD-10-CM

## 2011-02-12 ENCOUNTER — Other Ambulatory Visit: Payer: Medicare Other

## 2011-02-14 ENCOUNTER — Telehealth: Payer: Self-pay | Admitting: Pulmonary Disease

## 2011-02-14 NOTE — Telephone Encounter (Signed)
Destiny Shelton, please let pt know that her ct shows the nodules are stable.  The radiologist has recommended one more f/u in one year. I concur, because that would give her 2 yrs of relative stability.  She needs to call us next September to arrange at [facility].

## 2011-02-17 ENCOUNTER — Ambulatory Visit (INDEPENDENT_AMBULATORY_CARE_PROVIDER_SITE_OTHER): Payer: Medicare Other | Admitting: Pulmonary Disease

## 2011-02-17 ENCOUNTER — Encounter: Payer: Self-pay | Admitting: Pulmonary Disease

## 2011-02-17 DIAGNOSIS — Z23 Encounter for immunization: Secondary | ICD-10-CM

## 2011-02-17 DIAGNOSIS — J449 Chronic obstructive pulmonary disease, unspecified: Secondary | ICD-10-CM

## 2011-02-17 DIAGNOSIS — R918 Other nonspecific abnormal finding of lung field: Secondary | ICD-10-CM

## 2011-02-17 NOTE — Assessment & Plan Note (Signed)
The patient's recent followup CT scan showed no change in her tiny nodules.  A one year followup is recommended.

## 2011-02-17 NOTE — Telephone Encounter (Signed)
Pt was seen here today in the office for an appt with [facility] and the results of her CT were given to her by [facility].  NOthing further needed.

## 2011-02-17 NOTE — Patient Instructions (Signed)
Stop daliresp for next 3-4 weeks to see if you notice a difference off the medication Stay as active as possible, and work on weight loss Will give you a flu shot today. Your small spots are unchanged on your ct chest.  Will need a followup in one year.  Please call next summer for [place] to set up. followup with me in 6mos.

## 2011-02-17 NOTE — Progress Notes (Signed)
  Subjective:    Patient ID: Destiny Shelton, female    DOB: 21-Jul-1945, 65 y.o.   MRN: [id number]  HPI The patient comes in today for followup of her known COPD, and also pulmonary nodules.  Her most recent CT of the chest shows no change in her tiny nodules, and a followup is recommended in one year.  Patient states that her breathing has been near her usual baseline, and she has been taking her medications and oxygen compliantly.  She has not had any chest infection or acute exacerbation since her last visit.  She was started on a trial of daliresp by her primary physician to see if it would help her breathing.  She at first thought it might help, but now feels it has not made a big difference.   Review of Systems  Constitutional: Negative for fever and unexpected weight change.  HENT: Positive for nosebleeds, congestion, sneezing and sinus pressure. Negative for ear pain, sore throat, rhinorrhea, trouble swallowing, dental problem and postnasal drip.   Eyes: Positive for itching. Negative for redness.  Respiratory: Positive for cough, chest tightness, shortness of breath and wheezing.   Cardiovascular: Positive for palpitations. Negative for leg swelling.  Gastrointestinal: Negative for nausea and vomiting.  Genitourinary: Negative for dysuria.  Musculoskeletal: Negative for joint swelling.  Skin: Negative for rash.  Neurological: Negative for headaches.  Hematological: Bruises/bleeds easily.  Psychiatric/Behavioral: Negative for dysphoric mood. The patient is not nervous/anxious.        Objective:   Physical Exam Obese female in no acute distress Nose without purulence or discharge noted Chest with mild decrease in breath sounds, no wheezes or rhonchi Cardiac exam with regular rate and rhythm Lower extremities with minimal edema, no cyanosis noted Alert and oriented, moves all 4 extremities.       Assessment & Plan:

## 2011-02-17 NOTE — Assessment & Plan Note (Signed)
The patient states that her breathing is near her usual baseline.  She has not had a recent acute exacerbation or pulmonary infection.  I've asked her to continue on her bronchodilator regimen, but would discontinue daliresp because of its expense and perceived lack of efficacy.  I have asked her to stay as active as possible, and to work aggressively on weight loss.  We'll also give her a flu shot today.

## 2011-02-21 ENCOUNTER — Encounter: Payer: Self-pay | Admitting: Pulmonary Disease

## 2011-02-21 LAB — BASIC METABOLIC PANEL
BUN: 16
CO2: 25
GFR calc non Af Amer: >60
Glucose, Bld: 189 — ABNORMAL HIGH
Potassium: 3.7
Sodium: 136

## 2011-02-25 LAB — CARDIAC PANEL(CRET KIN+CKTOT+MB+TROPI)
CK, MB: 2.5
CK, MB: 5.5 — ABNORMAL HIGH
Relative Index: 2.4
Relative Index: 3.3 — ABNORMAL HIGH
Relative Index: 4.1 — ABNORMAL HIGH
Total CK: 105
Troponin I: 0.02

## 2011-02-25 LAB — COMPREHENSIVE METABOLIC PANEL
ALT: 18
ALT: 19
AST: 19
Alkaline Phosphatase: 71
Alkaline Phosphatase: 74
CO2: 24
CO2: 29
Calcium: 8.9
Chloride: 97
GFR calc Af Amer: >60
GFR calc non Af Amer: >60
GFR calc non Af Amer: >60
Glucose, Bld: 129 — ABNORMAL HIGH
Glucose, Bld: 252 — ABNORMAL HIGH
Potassium: 3.6
Potassium: 4.1
Sodium: 133 — ABNORMAL LOW
Sodium: 135
Total Bilirubin: 0.6
Total Bilirubin: 0.7

## 2011-02-25 LAB — PROTIME-INR
INR: 1
Prothrombin Time: 13.3

## 2011-02-25 LAB — URINE MICROSCOPIC-ADD ON

## 2011-02-25 LAB — B-NATRIURETIC PEPTIDE (CONVERTED LAB): Pro B Natriuretic peptide (BNP): 92

## 2011-02-25 LAB — BASIC METABOLIC PANEL
BUN: 15
GFR calc non Af Amer: >60
GFR calc non Af Amer: >60
Glucose, Bld: 211 — ABNORMAL HIGH
Potassium: 4
Potassium: 4.3
Sodium: 138

## 2011-02-25 LAB — CBC
HCT: 34.7 — ABNORMAL LOW
HCT: 34.8 — ABNORMAL LOW
HCT: 37.1
HCT: 37.2
HCT: 38.1
Hemoglobin: 11.9 — ABNORMAL LOW
Hemoglobin: 12.3
Hemoglobin: 12.7
Hemoglobin: 12.7
MCHC: 33.2
MCHC: 33.3
MCV: 84.5
MCV: 84.5
Platelets: 261
Platelets: 263
RBC: 4.4
RDW: 14.2
RDW: 14.2
WBC: 14.1 — ABNORMAL HIGH
WBC: 17 — ABNORMAL HIGH

## 2011-02-25 LAB — URINALYSIS, ROUTINE W REFLEX MICROSCOPIC
Ketones, ur: NEGATIVE
Nitrite: NEGATIVE
Protein, ur: NEGATIVE
pH: 8

## 2011-02-25 LAB — DIFFERENTIAL
Basophils Relative: 0
Eosinophils Absolute: 0.1
Eosinophils Relative: 0
Lymphs Abs: 1.8
Neutrophils Relative %: 82 — ABNORMAL HIGH

## 2011-02-25 LAB — LIPID PANEL
Triglycerides: 60
VLDL: 12

## 2011-02-25 LAB — CK TOTAL AND CKMB (NOT AT ARMC): Relative Index: 1.5

## 2011-02-25 LAB — URINE CULTURE

## 2011-02-25 LAB — PHOSPHORUS: Phosphorus: 5.3 — ABNORMAL HIGH

## 2011-02-25 LAB — TROPONIN I: Troponin I: 0.02

## 2011-05-13 ENCOUNTER — Encounter: Payer: Self-pay | Admitting: Internal Medicine

## 2011-06-10 ENCOUNTER — Encounter: Payer: Self-pay | Admitting: Internal Medicine

## 2011-06-10 ENCOUNTER — Ambulatory Visit (INDEPENDENT_AMBULATORY_CARE_PROVIDER_SITE_OTHER): Payer: Medicare Other | Admitting: Internal Medicine

## 2011-06-10 VITALS — BP 132/60 | HR 80 | Ht 65.0 in | Wt 234.0 lb

## 2011-06-10 DIAGNOSIS — Z9283 Personal history of failed moderate sedation: Secondary | ICD-10-CM

## 2011-06-10 DIAGNOSIS — Z86010 Personal history of colonic polyps: Secondary | ICD-10-CM | POA: Diagnosis not present

## 2011-06-10 DIAGNOSIS — J449 Chronic obstructive pulmonary disease, unspecified: Secondary | ICD-10-CM

## 2011-06-10 NOTE — Progress Notes (Signed)
Patient ID: Destiny Shelton, female   DOB: [birth year], 66 y.o.   MRN: [id number] The patient presents to discuss possible routine repeat screening colonoscopy. She has a previous history of adenomatous colon polyps, advanced adenomas. She had multiple colonoscopies in 2008 into 2009 with clearing of 2 large polyps. One was in the cecum and one was in the sigmoid. She was to be considered for a repeat colonoscopy about 3 years since the last. Her primary care physician discovered that she had not had a colonoscopy about 3 years after last seizure and center for discussion. She had Hemoccult testing this year, she does not know the results but says she was not told it was positive for blood. She denies any bleeding problems at this time.  She is on home oxygen since the last colonoscopy. Continuous oxygen with severe COPD.  Outpatient Prescriptions Prior to Visit  Medication Sig Dispense Refill  . albuterol (PROAIR HFA) 108 (90 BASE) MCG/ACT inhaler Take 1-2 puffs every 4-6 hrs as needed       . alendronate (FOSAMAX) 70 MG tablet Take 70 mg by mouth every 7 (seven) days. Take with a full glass of water on an empty stomach.       . ALPRAZolam (XANAX) 0.5 MG tablet 1/2 - 1 tab by mouth once daily as needed        . budesonide-formoterol (SYMBICORT) 160-4.5 MCG/ACT inhaler Inhale 2 puffs into the lungs 2 (two) times daily.        Marland Kitchen CALCIUM-VITAMIN D PO Take 1 tablet by mouth 2 (two) times daily.       . chlorthalidone (HYGROTON) 25 MG tablet Take 25 mg by mouth daily.        . cyclobenzaprine (FLEXERIL) 10 MG tablet Take 10 mg by mouth at bedtime.       Marland Kitchen diltiazem (MATZIM LA) 420 MG 24 hr tablet Take 420 mg by mouth daily.        Marland Kitchen docusate sodium (COLACE) 100 MG capsule Take 100 mg by mouth 2 (two) times daily.        . DULoxetine (CYMBALTA) 60 MG capsule Take 60 mg by mouth daily.        Marland Kitchen gabapentin (NEURONTIN) 600 MG tablet Take 3 tabs By mouth  daily as needed      . hydrocodone-acetaminophen  (LORCET-HD) 5-500 MG per capsule Take 1 capsule by mouth every 8 (eight) hours as needed.        . insulin glargine (LANTUS) 100 UNIT/ML injection Inject 84 Units into the skin daily.       . metFORMIN (GLUCOPHAGE) 500 MG tablet Take 1,000 mg by mouth 2 (two) times daily with a meal.        . Multiple Vitamin (MULTIVITAMIN) tablet Take 1 tablet by mouth daily.        Marland Kitchen omeprazole (PRILOSEC) 20 MG capsule Take 20 mg by mouth daily.        . potassium chloride (KLOR-CON) 10 MEQ CR tablet Take 3 tabs By mouth daily       . pravastatin (PRAVACHOL) 40 MG tablet Take 80 mg by mouth daily.        . roflumilast (DALIRESP) 500 MCG TABS tablet Take 500 mcg by mouth daily.        Marland Kitchen tiotropium (SPIRIVA) 18 MCG inhalation capsule Place 18 mcg into inhaler and inhale daily.        . valsartan (DIOVAN) 160 MG tablet Take 1 1/2 tabs by mouth  daily      . insulin glulisine (APIDRA SOLOSTAR) 100 UNIT/ML injection Inject 8 Units into the skin daily.          Past Medical History  Diagnosis Date  . ALLERGIC RHINITIS   . Sleep apnea   . Hypertension   . Adenomatous colon polyp   . Gastritis   . External hemorrhoids   . Diverticulosis of colon   . Diabetes mellitus   . Depression   . Osteoporosis   . Obesity   . Nephrolithiasis   . Back pain, chronic   . Osteoarthritis   . Anxiety   . Hyperlipemia   . Kidney stones     hx of    Past Surgical History  Procedure Date  . Appendectomy 1963  . Cholecystectomy 1963  . Abdominal hysterectomy 1978  . Total knee arthroplasty 1997    right  . Upper gastrointestinal endoscopy 04/09/2006    w/Dilation, gastritis  . Colonoscopy w/ biopsies and polypectomy 07/22/2007, 04/28/2007    2009: diverticulosis, 2008: diverticulosis, adenomatous polyps    Filed Vitals:   06/10/11 1406  BP: 132/60  Pulse: 80    Assessment and plan:  This lady has a personal history of advanced colon adenomas, but also has a medical history  with significant comorbidities  especially her severe COPD. We discussed the pros and cons of continuing routine screening colonoscopy. Her husband was present. I explained that because of her severe COPD her ability to tolerate complications from colonoscopy is less. She understands that if she fourth goes routine screening she could develop and died from colorectal cancer. After discussing this, she is decided to stop routine screening colonoscopy. I did give her the option of doing that or proceeding with colonoscopy. She may continue routine screening Hemoccults though not convinced there is value in at this point either that she can discuss that with her primary care physician. It is possible, she had a positive Hemoccult, she might not be a candidate for colonoscopy depending upon her health status.

## 2011-06-10 NOTE — Patient Instructions (Signed)
Please return to see Dr. Gessner as needed. 

## 2011-06-11 ENCOUNTER — Encounter: Payer: Self-pay | Admitting: Internal Medicine

## 2011-07-24 ENCOUNTER — Telehealth: Payer: Self-pay | Admitting: *Deleted

## 2011-07-24 ENCOUNTER — Telehealth: Payer: Self-pay | Admitting: Pulmonary Disease

## 2011-07-24 NOTE — Telephone Encounter (Signed)
Called and spoke with pt. Pt states she has a pending appt with KC in March and wanted to know if she needed to have CT done as well.  Reviewed last OV on 02/17/11,  Pt instructions state, CT unchanged so recommended repeat scan in 1 year and to call back in the summer to schedule this.  Informed pt of this information, that she is due this summer for CT of chest, not now.  Pt verbalized understanding and denied any further questions.

## 2011-07-24 NOTE — Telephone Encounter (Signed)
Error.  Wrong doctor.  Holly D Pryor ° °

## 2011-08-12 DIAGNOSIS — R0602 Shortness of breath: Secondary | ICD-10-CM | POA: Diagnosis not present

## 2011-08-12 DIAGNOSIS — J449 Chronic obstructive pulmonary disease, unspecified: Secondary | ICD-10-CM | POA: Diagnosis not present

## 2011-08-12 DIAGNOSIS — I10 Essential (primary) hypertension: Secondary | ICD-10-CM | POA: Diagnosis not present

## 2011-08-19 ENCOUNTER — Encounter: Payer: Self-pay | Admitting: Pulmonary Disease

## 2011-08-19 ENCOUNTER — Ambulatory Visit (INDEPENDENT_AMBULATORY_CARE_PROVIDER_SITE_OTHER): Payer: Medicare Other | Admitting: Pulmonary Disease

## 2011-08-19 VITALS — BP 122/62 | HR 136 | Temp 98.0°F | Ht 67.0 in | Wt 238.2 lb

## 2011-08-19 DIAGNOSIS — J449 Chronic obstructive pulmonary disease, unspecified: Secondary | ICD-10-CM | POA: Diagnosis not present

## 2011-08-19 NOTE — Patient Instructions (Signed)
No change in current medications and oxygen Work on weight loss and conditioning.  Let me know if you would like to consider pulmonary rehab at one of the local hospitals. followup with me in 6mos if doing well.

## 2011-08-19 NOTE — Progress Notes (Signed)
  Subjective:    Patient ID: Destiny Shelton, female    DOB: [birth year], 66 y.o.   MRN: [id number]  HPI The patient comes in today for followup of her known significant emphysema with chronic respiratory failure.  She is maintaining on an aggressive bronchodilator regimen, and that the last visit I had asked her to discontinue daliresp since she did not feel it was contributing much to her symptoms.  The patient discontinued the medication, but saw that her breathing worsened.  She then restarted the medication, and continues to use at this time.  She feels that her breathing is at her usual baseline compared to last visit, and denies any significant chest congestion or mucus.  She has had no worsening of lower extremity edema.   Review of Systems  Constitutional: Negative for fever and unexpected weight change.  HENT: Positive for ear pain, nosebleeds, congestion and rhinorrhea. Negative for sore throat, sneezing, trouble swallowing, dental problem, postnasal drip and sinus pressure.   Eyes: Negative for redness and itching.  Respiratory: Positive for cough, chest tightness, shortness of breath and wheezing.   Cardiovascular: Positive for palpitations and leg swelling.  Gastrointestinal: Negative for nausea and vomiting.  Genitourinary: Negative for dysuria.  Musculoskeletal: Negative for joint swelling.  Skin: Negative for rash.  Neurological: Negative for headaches.  Hematological: Bruises/bleeds easily.  Psychiatric/Behavioral: Positive for dysphoric mood. The patient is not nervous/anxious.        Objective:   Physical Exam Obese female in no acute distress Nose without purulence or discharge noted Chest with decreased breath sounds, no wheezes or rhonchi.  Adequate airflow noted Cardiac exam was tachycardic but regular rhythm Lower extremities with no significant edema, no cyanosis noted Alert and oriented, moves all 4 extremities.       Assessment & Plan:

## 2011-08-19 NOTE — Assessment & Plan Note (Signed)
The patient is near her usual baseline, and is on an aggressive bronchodilator regimen.  She does still have a sinus tachycardia, and she tells me that her cardiologist is working on this.  I have explained to her that we have maximized medical therapy, and that further benefit would probably come with weight loss and conditioning.  I have offered to send her to pulmonary rehabilitation, and she will consider.

## 2011-09-01 ENCOUNTER — Telehealth: Payer: Self-pay | Admitting: Pulmonary Disease

## 2011-09-01 DIAGNOSIS — J449 Chronic obstructive pulmonary disease, unspecified: Secondary | ICD-10-CM

## 2011-09-01 NOTE — Telephone Encounter (Signed)
I spoke with pt and is aware ok to start pulm rehab. I have placed order and pt is aware someone will be contacting her regarding this. She voiced her understanding and needed nothing further

## 2011-09-01 NOTE — Telephone Encounter (Signed)
Called spoke with patient, verified that she has decided to go ahead with the pulm rehab program.  Dr Shelle Iron, please advise of okay to enter the order.  Thanks.

## 2011-09-01 NOTE — Telephone Encounter (Signed)
Ok with me. Thanks.  

## 2011-09-12 DIAGNOSIS — E785 Hyperlipidemia, unspecified: Secondary | ICD-10-CM | POA: Diagnosis not present

## 2011-09-12 DIAGNOSIS — R05 Cough: Secondary | ICD-10-CM | POA: Diagnosis not present

## 2011-09-12 DIAGNOSIS — E1149 Type 2 diabetes mellitus with other diabetic neurological complication: Secondary | ICD-10-CM | POA: Diagnosis not present

## 2011-09-12 DIAGNOSIS — J449 Chronic obstructive pulmonary disease, unspecified: Secondary | ICD-10-CM | POA: Diagnosis not present

## 2011-09-12 DIAGNOSIS — G609 Hereditary and idiopathic neuropathy, unspecified: Secondary | ICD-10-CM | POA: Diagnosis not present

## 2011-09-12 DIAGNOSIS — I10 Essential (primary) hypertension: Secondary | ICD-10-CM | POA: Diagnosis not present

## 2011-10-09 ENCOUNTER — Telehealth: Payer: Self-pay | Admitting: Pulmonary Disease

## 2011-10-09 NOTE — Telephone Encounter (Signed)
Order was placed for pulmonary rehab on 09-01-11. [facility], please advise the status of this order, thanks!

## 2011-10-09 NOTE — Telephone Encounter (Signed)
Called and LM for Phyllis in Pulmonary Rehab to return my call on status of referral.

## 2011-10-10 NOTE — Telephone Encounter (Signed)
Called again this morning and spoke with [name] at [facility]. She states that they never received order that was faxed on 09/01/11. Wrote up another rehab order form and re-faxed order to [phone]. Confirmation received. Called back and spoke with [name] and she stated she still hasn't received it. Re-Faxed order again for the third time, held on the phone to verify order was received. Asked them to contact patient to arrange Rehab ASAP. She stated that Destiny Shelton was on vacation until 10/27/11, but she would see that Destiny Shelton called her to arrange as soon as she returns.

## 2011-10-10 NOTE — Telephone Encounter (Signed)
Called and spoke with patient and she stated that Jamesetta So has called her last night and arranged for her to come in on 10/28/11. Pt is aware. Advised patient that if she needed anything else, to please contact us.

## 2011-10-28 ENCOUNTER — Encounter (HOSPITAL_COMMUNITY): Payer: Self-pay

## 2011-10-28 ENCOUNTER — Encounter (HOSPITAL_COMMUNITY)
Admission: RE | Admit: 2011-10-28 | Discharge: 2011-10-28 | Disposition: A | Discharge status: Home / Self Care | Payer: Medicare Other | Source: Ambulatory Visit | Attending: Pulmonary Disease | Admitting: Pulmonary Disease

## 2011-10-28 DIAGNOSIS — R0609 Other forms of dyspnea: Secondary | ICD-10-CM | POA: Insufficient documentation

## 2011-10-28 DIAGNOSIS — J4489 Other specified chronic obstructive pulmonary disease: Secondary | ICD-10-CM | POA: Insufficient documentation

## 2011-10-28 DIAGNOSIS — G4722 Circadian rhythm sleep disorder, advanced sleep phase type: Secondary | ICD-10-CM | POA: Insufficient documentation

## 2011-10-28 DIAGNOSIS — J984 Other disorders of lung: Secondary | ICD-10-CM | POA: Insufficient documentation

## 2011-10-28 DIAGNOSIS — Z5189 Encounter for other specified aftercare: Secondary | ICD-10-CM | POA: Insufficient documentation

## 2011-10-28 DIAGNOSIS — R0989 Other specified symptoms and signs involving the circulatory and respiratory systems: Secondary | ICD-10-CM | POA: Insufficient documentation

## 2011-10-28 DIAGNOSIS — J449 Chronic obstructive pulmonary disease, unspecified: Secondary | ICD-10-CM | POA: Insufficient documentation

## 2011-10-28 DIAGNOSIS — I10 Essential (primary) hypertension: Secondary | ICD-10-CM | POA: Insufficient documentation

## 2011-10-28 DIAGNOSIS — E119 Type 2 diabetes mellitus without complications: Secondary | ICD-10-CM | POA: Insufficient documentation

## 2011-10-28 DIAGNOSIS — Z9981 Dependence on supplemental oxygen: Secondary | ICD-10-CM | POA: Insufficient documentation

## 2011-10-28 HISTORY — DX: Other supraventricular tachycardia: I47.19

## 2011-10-28 HISTORY — DX: Supraventricular tachycardia: I47.1

## 2011-10-28 NOTE — Progress Notes (Signed)
Destiny Shelton came in today for orientation to Pulmonary Rehab.  Chart reviewed, medications reviewed, and nurse assessment done.  Safety and hand hygiene in the exercise area reviewed with patient.  Patient voices understanding.Demonstration and practice of PLB using pulse oximeter.  Patient able to return demonstration satisfactorily.  6 min walk test done, oxygen saturations 88-90 % during walk test. She plans to start exercise on 10/30/11.

## 2011-10-30 ENCOUNTER — Encounter (HOSPITAL_COMMUNITY)
Admission: RE | Admit: 2011-10-30 | Discharge: 2011-10-30 | Disposition: A | Discharge status: Home / Self Care | Payer: Medicare Other | Source: Ambulatory Visit | Attending: Pulmonary Disease | Admitting: Pulmonary Disease

## 2011-10-30 DIAGNOSIS — J984 Other disorders of lung: Secondary | ICD-10-CM | POA: Diagnosis not present

## 2011-10-30 DIAGNOSIS — R0609 Other forms of dyspnea: Secondary | ICD-10-CM | POA: Diagnosis not present

## 2011-10-30 DIAGNOSIS — J449 Chronic obstructive pulmonary disease, unspecified: Secondary | ICD-10-CM | POA: Diagnosis not present

## 2011-10-30 DIAGNOSIS — Z9981 Dependence on supplemental oxygen: Secondary | ICD-10-CM | POA: Diagnosis not present

## 2011-10-30 DIAGNOSIS — Z5189 Encounter for other specified aftercare: Secondary | ICD-10-CM | POA: Diagnosis not present

## 2011-10-30 DIAGNOSIS — I10 Essential (primary) hypertension: Secondary | ICD-10-CM | POA: Diagnosis not present

## 2011-10-30 DIAGNOSIS — E119 Type 2 diabetes mellitus without complications: Secondary | ICD-10-CM | POA: Diagnosis not present

## 2011-10-30 DIAGNOSIS — G4722 Circadian rhythm sleep disorder, advanced sleep phase type: Secondary | ICD-10-CM | POA: Diagnosis not present

## 2011-10-30 NOTE — Progress Notes (Signed)
1st day of exercise in Pulmonary Rehab.  Vital signs stable, oxygen levels dropped in mid to upper 80s on 4 liters of oxygen with exercise.  Demonstration and practice of PLB during each work station, rest breaks taken every 2 minutes.  She was very anxious at beginning of class.  Encouraged and supported.  Will request nasal oxymizer  To help with low sats.

## 2011-10-30 NOTE — Progress Notes (Signed)
Pulmonary Rehab Nutrition Screen  Destiny Shelton 66 y.o. female             Ht: 66.5" Ht Readings from Last 1 Encounters:  08/19/11 5\' 7"  (1.702 m)    Wt:   236.3lb (107.4 kg) Wt Readings from Last 3 Encounters:  08/19/11 238 lb 3.2 oz (108.047 kg)  06/10/11 234 lb (106.142 kg)  02/17/11 237 lb (107.502 kg)    BMI: 37.6  47.9%body fat                       Rate Your Plate Score: 64  Please answer the following questions:             YES  NO    Do you live in a nursing home?  X   Do you eat out more than 3 times per week?    X If yes, how many times per week do you eat out?   Do you have food allergies?   X If yes, what are you allergic to?  Have you gained or lost more than 10 lbs without trying?               X If yes, how much weight have you  lost or gained?  lbs over  weeks/mo   Do you want to lose weight?    X  If yes, what is a goal weight or amount of weight you would like to lose? 100 lbs  Do you eat alone most of the time?   X   Do you eat less than 2 meals/day?  X If yes, how many meals do you eat?  Do you use canned and convenience food? X    Do you use a salt shaker?  X   Do you drink more than 3 alcoholic drinks/day?  X If yes, how many drinks per day?  Are you having trouble with constipation? *  X If yes, what are you doing to help relieve constipation?  Do you have financial difficulties with buying food? *  X   Do you usually need help with grocery shopping or with cooking? * X    Do you have a poor appetite? *                                       X   Do you have trouble chewing/ swallowing? *   X   Do you take vitamin and mineral or herbal supplements? * X  If yes, what kind of supplements do you currently take? MVI    Past Medical History  Diagnosis Date  . ALLERGIC RHINITIS   . Sleep apnea   . Hypertension   . Adenomatous colon polyp   . Gastritis   . External hemorrhoids   . Diverticulosis of colon   . Diabetes mellitus   . Depression   .  Osteoporosis   . Obesity   . Nephrolithiasis   . Back pain, chronic   . Osteoarthritis   . Anxiety   . Hyperlipemia   . Kidney stones     hx of   . Atrial tachycardia   . Asthma    Labs Lipid Panel - no recent lipids noted. 09/07/2007 lipids reviewed. No results found for this basename: HGBA1C   Nutrition Risk Level: High

## 2011-11-04 ENCOUNTER — Encounter (HOSPITAL_COMMUNITY)
Admission: RE | Admit: 2011-11-04 | Discharge: 2011-11-04 | Disposition: A | Discharge status: Home / Self Care | Payer: Medicare Other | Source: Ambulatory Visit | Attending: Pulmonary Disease | Admitting: Pulmonary Disease

## 2011-11-04 DIAGNOSIS — Z5189 Encounter for other specified aftercare: Secondary | ICD-10-CM | POA: Insufficient documentation

## 2011-11-04 DIAGNOSIS — J4489 Other specified chronic obstructive pulmonary disease: Secondary | ICD-10-CM | POA: Insufficient documentation

## 2011-11-04 DIAGNOSIS — J449 Chronic obstructive pulmonary disease, unspecified: Secondary | ICD-10-CM | POA: Diagnosis not present

## 2011-11-04 DIAGNOSIS — R0609 Other forms of dyspnea: Secondary | ICD-10-CM | POA: Insufficient documentation

## 2011-11-04 DIAGNOSIS — G4722 Circadian rhythm sleep disorder, advanced sleep phase type: Secondary | ICD-10-CM | POA: Insufficient documentation

## 2011-11-04 DIAGNOSIS — Z9981 Dependence on supplemental oxygen: Secondary | ICD-10-CM | POA: Diagnosis not present

## 2011-11-04 DIAGNOSIS — E119 Type 2 diabetes mellitus without complications: Secondary | ICD-10-CM | POA: Insufficient documentation

## 2011-11-04 DIAGNOSIS — R0989 Other specified symptoms and signs involving the circulatory and respiratory systems: Secondary | ICD-10-CM | POA: Insufficient documentation

## 2011-11-04 DIAGNOSIS — I10 Essential (primary) hypertension: Secondary | ICD-10-CM | POA: Insufficient documentation

## 2011-11-04 DIAGNOSIS — J984 Other disorders of lung: Secondary | ICD-10-CM | POA: Insufficient documentation

## 2011-11-06 ENCOUNTER — Encounter (HOSPITAL_COMMUNITY)
Admission: RE | Admit: 2011-11-06 | Discharge: 2011-11-06 | Disposition: A | Discharge status: Home / Self Care | Payer: Medicare Other | Source: Ambulatory Visit | Attending: Pulmonary Disease | Admitting: Pulmonary Disease

## 2011-11-06 DIAGNOSIS — Z9981 Dependence on supplemental oxygen: Secondary | ICD-10-CM | POA: Diagnosis not present

## 2011-11-06 DIAGNOSIS — J984 Other disorders of lung: Secondary | ICD-10-CM | POA: Diagnosis not present

## 2011-11-06 DIAGNOSIS — Z5189 Encounter for other specified aftercare: Secondary | ICD-10-CM | POA: Diagnosis not present

## 2011-11-06 DIAGNOSIS — E119 Type 2 diabetes mellitus without complications: Secondary | ICD-10-CM | POA: Diagnosis not present

## 2011-11-06 DIAGNOSIS — J449 Chronic obstructive pulmonary disease, unspecified: Secondary | ICD-10-CM | POA: Diagnosis not present

## 2011-11-11 ENCOUNTER — Ambulatory Visit (INDEPENDENT_AMBULATORY_CARE_PROVIDER_SITE_OTHER): Payer: Medicare Other | Admitting: Pulmonary Disease

## 2011-11-11 ENCOUNTER — Encounter: Payer: Self-pay | Admitting: Pulmonary Disease

## 2011-11-11 ENCOUNTER — Encounter (HOSPITAL_COMMUNITY): Admission: RE | Admit: 2011-11-11 | Payer: Medicare Other | Source: Ambulatory Visit

## 2011-11-11 VITALS — BP 118/78 | HR 125 | Temp 98.3°F | Ht 67.0 in | Wt 235.4 lb

## 2011-11-11 DIAGNOSIS — J441 Chronic obstructive pulmonary disease with (acute) exacerbation: Secondary | ICD-10-CM | POA: Insufficient documentation

## 2011-11-11 MED ORDER — PREDNISONE 10 MG PO TABS: ORAL_TABLET | ORAL | Status: DC

## 2011-11-11 MED ORDER — LEVOFLOXACIN 750 MG PO TABS: 750.0000 mg | ORAL_TABLET | Freq: Every day | ORAL | Status: AC

## 2011-11-11 NOTE — Assessment & Plan Note (Signed)
The patient is having worsening pulmonary symptoms I suspect is secondary to an acute bronchitis with COPD exacerbation.  I think she needs to be treated with a course of antibiotics as well as steroids.  If she does not have improvement, or if she worsens, she needs to call us so that we can arrange followup.

## 2011-11-11 NOTE — Progress Notes (Signed)
  Subjective:    Patient ID: Destiny Shelton, female    DOB: 20-Aug-1945, 66 y.o.   MRN: [id number]  HPI Patient comes in today for an acute sick visit.  She has known COPD with chronic respiratory failure.  She gives a five-day history of increasing shortness of breath, as well as a cough with congestion and purulent mucus.  She denies any fevers, chills, or sweats.  She has not had significant lower extremity edema, and no pleuritic chest pain.  Her oxygen levels are a little lower than her usual baseline.   Review of Systems  Constitutional: Negative.  Negative for fever and unexpected weight change.  HENT: Positive for ear pain, nosebleeds, congestion, sneezing and postnasal drip. Negative for sore throat, rhinorrhea, trouble swallowing, dental problem and sinus pressure.        L/R ear pain  Eyes: Negative.  Negative for redness and itching.  Respiratory: Positive for cough, chest tightness, shortness of breath and wheezing.   Cardiovascular: Positive for chest pain. Negative for palpitations and leg swelling.  Gastrointestinal: Negative.  Negative for nausea and vomiting.  Genitourinary: Negative.  Negative for dysuria.  Musculoskeletal: Negative for joint swelling.  Skin: Negative for rash.  Neurological: Positive for headaches.  Hematological: Negative.  Does not bruise/bleed easily.  Psychiatric/Behavioral: Positive for dysphoric mood. The patient is not nervous/anxious.        Objective:   Physical Exam Obese female in no acute distress Nose without purulence or discharge noted Chest with scattered wheezes, but adequate air flow.  No crackles noted Cardiac exam with mildly tachycardic but regular rhythm Lower extremities with mild edema, no cyanosis Alert and oriented, moves all 4 extremities.       Assessment & Plan:

## 2011-11-11 NOTE — Patient Instructions (Signed)
Will treat for a copd flareup. Prednisone taper over the next 8 days. levaquin 750mg  one a day for 5 days. Keep already schedule followup apptm, but call us if you do not improve or if you worsen.

## 2011-11-13 ENCOUNTER — Encounter (HOSPITAL_COMMUNITY): Payer: Medicare Other

## 2011-11-17 DIAGNOSIS — E663 Overweight: Secondary | ICD-10-CM | POA: Diagnosis not present

## 2011-11-17 DIAGNOSIS — J449 Chronic obstructive pulmonary disease, unspecified: Secondary | ICD-10-CM | POA: Diagnosis not present

## 2011-11-17 DIAGNOSIS — R0602 Shortness of breath: Secondary | ICD-10-CM | POA: Diagnosis not present

## 2011-11-17 DIAGNOSIS — I10 Essential (primary) hypertension: Secondary | ICD-10-CM | POA: Diagnosis not present

## 2011-11-18 ENCOUNTER — Encounter (HOSPITAL_COMMUNITY)
Admission: RE | Admit: 2011-11-18 | Discharge: 2011-11-18 | Disposition: A | Discharge status: Home / Self Care | Payer: Medicare Other | Source: Ambulatory Visit | Attending: Pulmonary Disease | Admitting: Pulmonary Disease

## 2011-11-18 DIAGNOSIS — E119 Type 2 diabetes mellitus without complications: Secondary | ICD-10-CM | POA: Diagnosis not present

## 2011-11-18 DIAGNOSIS — J449 Chronic obstructive pulmonary disease, unspecified: Secondary | ICD-10-CM | POA: Diagnosis not present

## 2011-11-18 DIAGNOSIS — J984 Other disorders of lung: Secondary | ICD-10-CM | POA: Diagnosis not present

## 2011-11-18 DIAGNOSIS — Z5189 Encounter for other specified aftercare: Secondary | ICD-10-CM | POA: Diagnosis not present

## 2011-11-18 DIAGNOSIS — Z9981 Dependence on supplemental oxygen: Secondary | ICD-10-CM | POA: Diagnosis not present

## 2011-11-18 NOTE — Progress Notes (Signed)
Destiny Shelton 66 y.o. female Nutrition Note Spoke with pt. Pt is obese.  Pt wants to lose wt. Pt states she has decreased her portion sizes eaten and increased her fluid intake. Pt eats 3 meals a day; most prepared at home by pt's husband. Pt's husband does the grocery shopping because pt is unable to walk for extended periods of time. Pt states she is getting a Rolator walker so she can go shopping.  Making healthy food choices the majority of the time.  Pt's Rate Your Plate results reviewed with pt.  Pt expressed understanding.  Pt avoids some salty food. Pt primarily uses frozen vegetables and eats canned green beans and corn.  Pt does not add salt to food.  The role of sodium in lung disease reviewed with pt.  Pt is diabetic.  Pt checks CBG's 3 times a day (before breakfast and lunch and 1-2 hours after dinner).  CBG's reportedly 97 -109 mg/dL before meals.  Pt reports 2 hr post-dinner CBG's 125-200 mg/dL. Pt does not recall her exact A1c stating "it was 6 something."  Pt unable to recall recommended CBG ranges before meals and 1-2 hours after meals.  Recommended CBG ranges reviewed with pt.  Pt expressed understanding.  Pt reports she has previously received nutrition education for DM.  This Clinical research associate encouraged pt to attend the Diabetes [employer] class 11/25/11. Pt unsure if she will be able to attend DM class "because my husband brings me." Nutrition Diagnosis   Excessive sodium intake related to over consumption of processed food as evidenced by frequent consumption of canned vegetables.   Food-and nutrition-related knowledge deficit related to lack of exposure to information as related to diagnosis of pulmonary disease   Obesity related to excessive energy intake as evidenced by a BMI of 37.6 Nutrition Rx/Est. Daily Nutrition Needs for: ? wt loss 1300-1800 Kcal  85-105 gm protein   1500 mg or less sodium     175 gm CHO Nutrition Intervention   Pt's individual nutrition plan and goals reviewed with pt.   Benefits of adopting healthy eating habits discussed when pt's Rate Your Plate reviewed.   Pt to attend the Nutrition and Lung Disease class   Continual client-centered nutrition education by RD, as part of interdisciplinary care. Goal(s) 1. Pt to identify and limit food sources of sodium. 2. Identify food quantities necessary to achieve wt loss of  -2# per week to a goal wt of 96.4-104.5 kg (212-230 lb) at graduation from pulmonary rehab. Monitor and Evaluate progress toward nutrition goal with team.

## 2011-11-20 ENCOUNTER — Encounter (HOSPITAL_COMMUNITY)
Admission: RE | Admit: 2011-11-20 | Discharge: 2011-11-20 | Disposition: A | Discharge status: Home / Self Care | Payer: Medicare Other | Source: Ambulatory Visit | Attending: Pulmonary Disease | Admitting: Pulmonary Disease

## 2011-11-20 ENCOUNTER — Telehealth: Payer: Self-pay | Admitting: Pulmonary Disease

## 2011-11-20 DIAGNOSIS — J449 Chronic obstructive pulmonary disease, unspecified: Secondary | ICD-10-CM | POA: Diagnosis not present

## 2011-11-20 DIAGNOSIS — Z5189 Encounter for other specified aftercare: Secondary | ICD-10-CM | POA: Diagnosis not present

## 2011-11-20 DIAGNOSIS — Z9981 Dependence on supplemental oxygen: Secondary | ICD-10-CM | POA: Diagnosis not present

## 2011-11-20 DIAGNOSIS — J984 Other disorders of lung: Secondary | ICD-10-CM | POA: Diagnosis not present

## 2011-11-20 DIAGNOSIS — E119 Type 2 diabetes mellitus without complications: Secondary | ICD-10-CM | POA: Diagnosis not present

## 2011-11-20 MED ORDER — AMOXICILLIN-POT CLAVULANATE 500-125 MG PO TABS: 1.0000 | ORAL_TABLET | Freq: Two times a day (BID) | ORAL | Status: AC

## 2011-11-20 MED ORDER — AMOXICILLIN-POT CLAVULANATE 875-125 MG PO TABS: 1.0000 | ORAL_TABLET | Freq: Two times a day (BID) | ORAL | Status: DC

## 2011-11-20 NOTE — Telephone Encounter (Signed)
Pt says she finished her Prednisone and Levaquin earlier this week and says her symptoms did slightly improve while on the medications. She n9ow says her cough has returned and the sputum is green, her sob is getting worse and she has chest tightness and wheezing. Pt saw KC on 11/11/2011 and was told to call if her sxs did not improve. KC is out of the office this afternoon and we will forward msg to doc of the day, Dr. Maple Hudson. Pls advise. Allergies  Allergen Reactions  . Aspirin   . Nsaids

## 2011-11-20 NOTE — Telephone Encounter (Signed)
Per CY--- ok to call in augmentin 500mg   1 po bid  #14 and take the mucinex dm.  i called and spoke with pt  And she is aware of this being called to the pharamacy and i did ask the pharmacy to get the mucinex up for the pt as well.  Nothing further was needed.

## 2011-11-25 ENCOUNTER — Encounter (HOSPITAL_COMMUNITY)
Admission: RE | Admit: 2011-11-25 | Discharge: 2011-11-25 | Disposition: A | Discharge status: Home / Self Care | Payer: Medicare Other | Source: Ambulatory Visit | Attending: Pulmonary Disease | Admitting: Pulmonary Disease

## 2011-11-25 DIAGNOSIS — Z9981 Dependence on supplemental oxygen: Secondary | ICD-10-CM | POA: Diagnosis not present

## 2011-11-25 DIAGNOSIS — E119 Type 2 diabetes mellitus without complications: Secondary | ICD-10-CM | POA: Diagnosis not present

## 2011-11-25 DIAGNOSIS — Z5189 Encounter for other specified aftercare: Secondary | ICD-10-CM | POA: Diagnosis not present

## 2011-11-25 DIAGNOSIS — J984 Other disorders of lung: Secondary | ICD-10-CM | POA: Diagnosis not present

## 2011-11-25 DIAGNOSIS — J449 Chronic obstructive pulmonary disease, unspecified: Secondary | ICD-10-CM | POA: Diagnosis not present

## 2011-11-27 ENCOUNTER — Encounter (HOSPITAL_COMMUNITY)
Admission: RE | Admit: 2011-11-27 | Discharge: 2011-11-27 | Disposition: A | Discharge status: Home / Self Care | Payer: Medicare Other | Source: Ambulatory Visit | Attending: Pulmonary Disease | Admitting: Pulmonary Disease

## 2011-11-27 DIAGNOSIS — J984 Other disorders of lung: Secondary | ICD-10-CM | POA: Diagnosis not present

## 2011-11-27 DIAGNOSIS — J449 Chronic obstructive pulmonary disease, unspecified: Secondary | ICD-10-CM | POA: Diagnosis not present

## 2011-11-27 DIAGNOSIS — Z5189 Encounter for other specified aftercare: Secondary | ICD-10-CM | POA: Diagnosis not present

## 2011-11-27 DIAGNOSIS — E119 Type 2 diabetes mellitus without complications: Secondary | ICD-10-CM | POA: Diagnosis not present

## 2011-11-27 DIAGNOSIS — Z9981 Dependence on supplemental oxygen: Secondary | ICD-10-CM | POA: Diagnosis not present

## 2011-12-01 ENCOUNTER — Telehealth: Payer: Self-pay | Admitting: Pulmonary Disease

## 2011-12-01 HISTORY — PX: OTHER SURGICAL HISTORY: SHX169

## 2011-12-01 MED ORDER — NYSTATIN 100000 UNIT/ML MT SUSP: 400000.0000 [IU] | Freq: Three times a day (TID) | OROMUCOSAL | Status: AC

## 2011-12-01 NOTE — Telephone Encounter (Signed)
Spoke with the pt and she is c/o little white bumps on her tongue and her mouth is very sore as well since this weekend. Pt states she recently finished an antibiotic and since then she has noticed the bumps and soreness. Antibiotic was called in by Dr. Maple Hudson. Please advise. Carron Curie, CMA.  Allergies  Allergen Reactions  . Aspirin   . Nsaids

## 2011-12-01 NOTE — Telephone Encounter (Signed)
Rx was sent to pharm  Pt aware  

## 2011-12-01 NOTE — Telephone Encounter (Signed)
Ok to call in nystatin suspension 400,000 IU swish and swallow tid for 5 days.

## 2011-12-02 ENCOUNTER — Encounter (HOSPITAL_COMMUNITY)
Admission: RE | Admit: 2011-12-02 | Discharge: 2011-12-02 | Disposition: A | Discharge status: Home / Self Care | Payer: Medicare Other | Source: Ambulatory Visit | Attending: Pulmonary Disease | Admitting: Pulmonary Disease

## 2011-12-02 DIAGNOSIS — Z5189 Encounter for other specified aftercare: Secondary | ICD-10-CM | POA: Insufficient documentation

## 2011-12-02 DIAGNOSIS — J449 Chronic obstructive pulmonary disease, unspecified: Secondary | ICD-10-CM | POA: Diagnosis not present

## 2011-12-02 DIAGNOSIS — J984 Other disorders of lung: Secondary | ICD-10-CM | POA: Diagnosis not present

## 2011-12-02 DIAGNOSIS — R0989 Other specified symptoms and signs involving the circulatory and respiratory systems: Secondary | ICD-10-CM | POA: Insufficient documentation

## 2011-12-02 DIAGNOSIS — G4722 Circadian rhythm sleep disorder, advanced sleep phase type: Secondary | ICD-10-CM | POA: Insufficient documentation

## 2011-12-02 DIAGNOSIS — R0609 Other forms of dyspnea: Secondary | ICD-10-CM | POA: Insufficient documentation

## 2011-12-02 DIAGNOSIS — I10 Essential (primary) hypertension: Secondary | ICD-10-CM | POA: Diagnosis not present

## 2011-12-02 DIAGNOSIS — E119 Type 2 diabetes mellitus without complications: Secondary | ICD-10-CM | POA: Insufficient documentation

## 2011-12-02 DIAGNOSIS — Z9981 Dependence on supplemental oxygen: Secondary | ICD-10-CM | POA: Insufficient documentation

## 2011-12-02 DIAGNOSIS — J4489 Other specified chronic obstructive pulmonary disease: Secondary | ICD-10-CM | POA: Insufficient documentation

## 2011-12-04 ENCOUNTER — Encounter (HOSPITAL_COMMUNITY): Payer: Medicare Other

## 2011-12-09 ENCOUNTER — Encounter: Payer: Self-pay | Admitting: Allergy

## 2011-12-09 ENCOUNTER — Telehealth: Payer: Self-pay | Admitting: Pulmonary Disease

## 2011-12-09 ENCOUNTER — Encounter (HOSPITAL_COMMUNITY)
Admission: RE | Admit: 2011-12-09 | Discharge: 2011-12-09 | Disposition: A | Discharge status: Home / Self Care | Payer: Medicare Other | Source: Ambulatory Visit | Attending: Pulmonary Disease | Admitting: Pulmonary Disease

## 2011-12-09 NOTE — Telephone Encounter (Signed)
I spoke with pt and she states her oxygen dropped to 84% on 4l oxygen doing 1 lap on the track at pulm rehab--heart rate was 128 after resting. Pt has had increase SOB, cough w/ white phlem, chest tx, x the weekend and getting worse daily. No available openings w/ any doc or TP. Please advise KC thanks

## 2011-12-09 NOTE — Progress Notes (Signed)
Mrs. Firmin came to Pulmonary Rehab today, noted to have resting heart rate of 128.  She says she had increased yellow mucus over the week end and that has improved.  Now white but very thick.  Noted more shortness of breath today.  Decreased breath sounds throughout, more diminished on left side.  Temp 98.8.  We agreed to try exercise and her oxygen saturation dropped to 84%on 4L oxygen with 1 lap on track and heart rate to 144.  Exercise stopped, oxygen level returned to 93% 4L after rest.  Dr. Teddy Spike office called, they will call patient later today.  She was taken to her car by wheelchair, advised to do her nebulizer treatment on arrival home and rest the remainder of the day.    Cathie Olden RN

## 2011-12-09 NOTE — Telephone Encounter (Signed)
Pt called back- wants to hear from nurse before closing today. Destiny Shelton

## 2011-12-09 NOTE — Telephone Encounter (Signed)
Pt needs ov for evaluation, and to ER if worsen before visits.

## 2011-12-09 NOTE — Telephone Encounter (Signed)
Spoke with PT appt with TP on THursday. Advised pt if sxs get worse go to ED . Pt understood.

## 2011-12-11 ENCOUNTER — Ambulatory Visit (INDEPENDENT_AMBULATORY_CARE_PROVIDER_SITE_OTHER)
Admission: RE | Admit: 2011-12-11 | Discharge: 2011-12-11 | Disposition: A | Discharge status: Home / Self Care | Payer: Medicare Other | Source: Ambulatory Visit | Attending: Adult Health | Admitting: Adult Health

## 2011-12-11 ENCOUNTER — Encounter (HOSPITAL_COMMUNITY): Payer: Medicare Other

## 2011-12-11 ENCOUNTER — Other Ambulatory Visit (INDEPENDENT_AMBULATORY_CARE_PROVIDER_SITE_OTHER): Payer: Medicare Other

## 2011-12-11 ENCOUNTER — Ambulatory Visit (INDEPENDENT_AMBULATORY_CARE_PROVIDER_SITE_OTHER): Payer: Medicare Other | Admitting: Adult Health

## 2011-12-11 ENCOUNTER — Encounter: Payer: Self-pay | Admitting: Adult Health

## 2011-12-11 VITALS — BP 124/62 | HR 111 | Temp 97.6°F | Ht 65.0 in | Wt 238.4 lb

## 2011-12-11 DIAGNOSIS — J441 Chronic obstructive pulmonary disease with (acute) exacerbation: Secondary | ICD-10-CM | POA: Diagnosis not present

## 2011-12-11 DIAGNOSIS — J449 Chronic obstructive pulmonary disease, unspecified: Secondary | ICD-10-CM | POA: Diagnosis not present

## 2011-12-11 DIAGNOSIS — R918 Other nonspecific abnormal finding of lung field: Secondary | ICD-10-CM

## 2011-12-11 DIAGNOSIS — R0602 Shortness of breath: Secondary | ICD-10-CM

## 2011-12-11 DIAGNOSIS — J9819 Other pulmonary collapse: Secondary | ICD-10-CM | POA: Diagnosis not present

## 2011-12-11 LAB — BRAIN NATRIURETIC PEPTIDE: Pro B Natriuretic peptide (BNP): 11 pg/mL (ref 0.0–100.0)

## 2011-12-11 LAB — BASIC METABOLIC PANEL
CO2: 30 mEq/L (ref 19–32)
Chloride: 100 mEq/L (ref 96–112)
Creatinine, Ser: 0.7 mg/dL (ref 0.4–1.2)
Potassium: 4.2 mEq/L (ref 3.5–5.1)

## 2011-12-11 MED ORDER — PREDNISONE 10 MG PO TABS: ORAL_TABLET | ORAL | Status: DC

## 2011-12-11 NOTE — Patient Instructions (Addendum)
Prednisone taper as directed.  Extra Furosemide (fluid pill) daily for 3 days  I will call with lab results.  Make an appointment with Dr. Anthonette Legato regarding heartrate- take all your medicine bottles with you.  Increase Oxygen 6 l/m with walking and 4 l/m with rest .  Please contact office for sooner follow up if symptoms do not improve or worsen or seek emergency care  Follow up Dr. Shelle Iron in 2 months as planned and will set up CT chest .

## 2011-12-11 NOTE — Progress Notes (Signed)
  Subjective:    Patient ID: Destiny Shelton, female    DOB: 1945/08/17, 66 y.o.   MRN: [id number]  HPI 66 yo female with known hx of he has known COPD with chronic respiratory failure.   12/11/2011 Acute OV  Complains over the last 6  weeks of increased cough and min. congestion w/ DOE  Seen 6 weeks ago , tx w/ Levaquin and steroid taper.  Called in augmentin 3 weeks ago with some improvement.  But cough has returned with wheezing and increased dyspnea.  Complains of increased SOB, tightness, cough is mainly dry . On occasion has  white/green mucus.  Continues to desat at pulm rehab on 4 l/m over last few weeks.  No edema or chest pain.  No hemoptysis.  Has hx of tachycardia, seen by Dr. Anthonette Legato in cardiology , ?on diltazem and Verapamil is been adjusted per pt and lasix was added last week.  No records available at time of office visit.  She is very unclear what meds she is taking nor the doses.  Over last 5 visits she averages HR of 110-125bpm.     Review of Systems Constitutional:   No  weight loss, night sweats,  Fevers, chills,  ++fatigue, or  lassitude.  HEENT:   No headaches,  Difficulty swallowing,  Tooth/dental problems, or  Sore throat,                No sneezing, itching, ear ache, nasal congestion, post nasal drip,   CV:  No chest pain,  Orthopnea, PND,   anasarca, dizziness, palpitations, syncope.   GI  No heartburn, indigestion, abdominal pain, nausea, vomiting, diarrhea, change in bowel habits, loss of appetite, bloody stools.   Resp:    No coughing up of blood.    No chest wall deformity  Skin: no rash or lesions.  GU: no dysuria, change in color of urine, no urgency or frequency.  No flank pain, no hematuria   MS:  No joint pain or swelling.  No decreased range of motion.     Psych:  No change in mood or affect. No depression or anxiety.  No memory loss.         Objective:   Physical Exam GEN: A/Ox3; pleasant , NAD, obese   HEENT:  [place]/AT,  EACs-clear,  TMs-wnl, NOSE-clear, THROAT-clear, no lesions, no postnasal drip or exudate noted.   NECK:  Supple w/ fair ROM; no JVD; normal carotid impulses w/o bruits; no thyromegaly or nodules palpated; no lymphadenopathy.  RESP  Coarse BS  w/o, wheezes/ rales/ or rhonchi.no accessory muscle use, no dullness to percussion  CARD:  RRR, no m/r/g  , tr  peripheral edema, pulses intact, no cyanosis or clubbing.  GI:   Soft & nt; nml bowel sounds; no organomegaly or masses detected.  Musco: Warm bil, no deformities or joint swelling noted.   Neuro: alert, no focal deficits noted.    Skin: Warm, no lesions or rashes         Assessment & Plan:

## 2011-12-11 NOTE — Assessment & Plan Note (Signed)
Recurrent flare ? Component of volume overload /diastolic dysfnx .  CXR with acute infiltrate, lung nodule noted She has known lung nodule, last CT chest 02/2011 w/ stable nodules-due for repeat 02/2012. Will hold off on additional abx as she has finished 2 without significant improvement No signs of active infection (no fever or discolored mucus) Will try gentle diuresis , check bmet/bnp ?Persistent tachycardia playing role . ?if she is taking her meds correctly  Have suggested she set up ov with cards taking in all her meds for review.   Plan:  Prednisone taper as directed.  Extra Furosemide (fluid pill) daily for 3 days  I will call with lab results.  Make an appointment with Dr. Anthonette Legato regarding heartrate- take all your medicine bottles with you.  Increase Oxygen 6 l/m with walking and 4 l/m with rest .  Please contact office for sooner follow up if symptoms do not improve or worsen or seek emergency care  Follow up Dr. Shelle Iron in 2 months as planned and will set up CT chest .

## 2011-12-11 NOTE — Addendum Note (Signed)
Addended by: Boone Master E on: 12/11/2011 05:44 PM   Modules accepted: Orders

## 2011-12-16 ENCOUNTER — Encounter (HOSPITAL_COMMUNITY)
Admission: RE | Admit: 2011-12-16 | Discharge: 2011-12-16 | Disposition: A | Discharge status: Home / Self Care | Payer: Medicare Other | Source: Ambulatory Visit | Attending: Pulmonary Disease | Admitting: Pulmonary Disease

## 2011-12-16 DIAGNOSIS — R0602 Shortness of breath: Secondary | ICD-10-CM | POA: Diagnosis not present

## 2011-12-16 DIAGNOSIS — R079 Chest pain, unspecified: Secondary | ICD-10-CM | POA: Diagnosis not present

## 2011-12-16 DIAGNOSIS — E119 Type 2 diabetes mellitus without complications: Secondary | ICD-10-CM | POA: Diagnosis not present

## 2011-12-17 ENCOUNTER — Other Ambulatory Visit: Payer: Self-pay | Admitting: Cardiology

## 2011-12-17 ENCOUNTER — Encounter (HOSPITAL_COMMUNITY): Payer: Self-pay | Admitting: Pharmacy Technician

## 2011-12-17 DIAGNOSIS — Z01818 Encounter for other preprocedural examination: Secondary | ICD-10-CM | POA: Diagnosis not present

## 2011-12-17 DIAGNOSIS — E782 Mixed hyperlipidemia: Secondary | ICD-10-CM | POA: Diagnosis not present

## 2011-12-17 DIAGNOSIS — R5381 Other malaise: Secondary | ICD-10-CM | POA: Diagnosis not present

## 2011-12-17 DIAGNOSIS — D689 Coagulation defect, unspecified: Secondary | ICD-10-CM | POA: Diagnosis not present

## 2011-12-17 DIAGNOSIS — R0602 Shortness of breath: Secondary | ICD-10-CM | POA: Diagnosis not present

## 2011-12-18 ENCOUNTER — Encounter (HOSPITAL_COMMUNITY): Payer: Medicare Other

## 2011-12-19 ENCOUNTER — Ambulatory Visit (HOSPITAL_COMMUNITY)
Admission: RE | Admit: 2011-12-19 | Discharge: 2011-12-19 | Disposition: A | Discharge status: Home / Self Care | Payer: Medicare Other | Source: Ambulatory Visit | Attending: Cardiology | Admitting: Cardiology

## 2011-12-19 ENCOUNTER — Encounter (HOSPITAL_COMMUNITY)
Admission: RE | Disposition: A | Discharge status: Home / Self Care | Payer: Self-pay | Source: Ambulatory Visit | Attending: Cardiology

## 2011-12-19 ENCOUNTER — Encounter (HOSPITAL_COMMUNITY): Payer: Self-pay | Admitting: Cardiology

## 2011-12-19 DIAGNOSIS — J449 Chronic obstructive pulmonary disease, unspecified: Secondary | ICD-10-CM | POA: Diagnosis present

## 2011-12-19 DIAGNOSIS — I10 Essential (primary) hypertension: Secondary | ICD-10-CM | POA: Diagnosis present

## 2011-12-19 DIAGNOSIS — E785 Hyperlipidemia, unspecified: Secondary | ICD-10-CM | POA: Insufficient documentation

## 2011-12-19 DIAGNOSIS — IMO0001 Reserved for inherently not codable concepts without codable children: Secondary | ICD-10-CM | POA: Diagnosis present

## 2011-12-19 DIAGNOSIS — I4711 Inappropriate sinus tachycardia, so stated: Secondary | ICD-10-CM | POA: Diagnosis present

## 2011-12-19 DIAGNOSIS — R079 Chest pain, unspecified: Secondary | ICD-10-CM | POA: Diagnosis not present

## 2011-12-19 DIAGNOSIS — J4489 Other specified chronic obstructive pulmonary disease: Secondary | ICD-10-CM | POA: Insufficient documentation

## 2011-12-19 DIAGNOSIS — E119 Type 2 diabetes mellitus without complications: Secondary | ICD-10-CM | POA: Diagnosis not present

## 2011-12-19 DIAGNOSIS — I2789 Other specified pulmonary heart diseases: Secondary | ICD-10-CM | POA: Insufficient documentation

## 2011-12-19 DIAGNOSIS — R0602 Shortness of breath: Secondary | ICD-10-CM | POA: Diagnosis not present

## 2011-12-19 DIAGNOSIS — R000 Tachycardia, unspecified: Secondary | ICD-10-CM | POA: Insufficient documentation

## 2011-12-19 HISTORY — PX: LEFT AND RIGHT HEART CATHETERIZATION WITH CORONARY ANGIOGRAM: SHX5449

## 2011-12-19 HISTORY — DX: Type 2 diabetes mellitus with other diabetic neurological complication: E11.49

## 2011-12-19 HISTORY — DX: Morbid (severe) obesity due to excess calories: E66.01

## 2011-12-19 HISTORY — DX: Inappropriate sinus tachycardia, so stated: I47.11

## 2011-12-19 HISTORY — DX: Tachycardia, unspecified: R00.0

## 2011-12-19 LAB — CBC
HCT: 35.8 % — ABNORMAL LOW (ref 36.0–46.0)
Hemoglobin: 10.7 g/dL — ABNORMAL LOW (ref 12.0–15.0)
MCHC: 29.9 g/dL — ABNORMAL LOW (ref 30.0–36.0)
MCV: 75.4 fL — ABNORMAL LOW (ref 78.0–100.0)
RDW: 17.2 % — ABNORMAL HIGH (ref 11.5–15.5)

## 2011-12-19 LAB — BASIC METABOLIC PANEL
BUN: 16 mg/dL (ref 6–23)
CO2: 27 mEq/L (ref 19–32)
Chloride: 98 mEq/L (ref 96–112)
Creatinine, Ser: 0.6 mg/dL (ref 0.50–1.10)
Glucose, Bld: 105 mg/dL — ABNORMAL HIGH (ref 70–99)

## 2011-12-19 LAB — POCT I-STAT 3, ART BLOOD GAS (G3+)
Bicarbonate: 32.3 mEq/L — ABNORMAL HIGH (ref 20.0–24.0)
O2 Saturation: 83 %
TCO2: 34 mmol/L (ref 0–100)
pH, Arterial: 7.423 (ref 7.350–7.450)

## 2011-12-19 LAB — POCT I-STAT 3, VENOUS BLOOD GAS (G3P V)
Acid-Base Excess: 8 mmol/L — ABNORMAL HIGH (ref 0.0–2.0)
Acid-Base Excess: 6 mmol/L — ABNORMAL HIGH (ref 0.0–2.0)
Bicarbonate: 34.2 mEq/L — ABNORMAL HIGH (ref 20.0–24.0)
O2 Saturation: 68 %
pCO2, Ven: 54.4 mmHg — ABNORMAL HIGH (ref 45.0–50.0)

## 2011-12-19 SURGERY — LEFT AND RIGHT HEART CATHETERIZATION WITH CORONARY ANGIOGRAM

## 2011-12-19 MED ORDER — METFORMIN HCL 500 MG PO TABS: ORAL_TABLET | ORAL | Status: DC

## 2011-12-19 MED ORDER — SODIUM CHLORIDE 0.9 % IJ SOLN: 3.0000 mL | INTRAMUSCULAR | Status: DC | PRN

## 2011-12-19 MED ORDER — DIAZEPAM 5 MG PO TABS
5.0000 mg | ORAL_TABLET | ORAL | Status: AC
  Administered 2011-12-19: 5 mg via ORAL
  Filled 2011-12-19: qty 1

## 2011-12-19 MED ORDER — LIDOCAINE HCL (PF) 1 % IJ SOLN
INTRAMUSCULAR | Status: AC
  Filled 2011-12-19: qty 30

## 2011-12-19 MED ORDER — SODIUM CHLORIDE 0.9 % IV SOLN
INTRAVENOUS | Status: DC
  Administered 2011-12-19: 08:00:00 via INTRAVENOUS

## 2011-12-19 MED ORDER — NITROGLYCERIN 0.2 MG/ML ON CALL CATH LAB
INTRAVENOUS | Status: AC
  Filled 2011-12-19: qty 1

## 2011-12-19 MED ORDER — HEPARIN SODIUM (PORCINE) 1000 UNIT/ML IJ SOLN
INTRAMUSCULAR | Status: AC
  Filled 2011-12-19: qty 1

## 2011-12-19 MED ORDER — FENTANYL CITRATE 0.05 MG/ML IJ SOLN
INTRAMUSCULAR | Status: AC
  Filled 2011-12-19: qty 2

## 2011-12-19 MED ORDER — HEPARIN (PORCINE) IN NACL 2-0.9 UNIT/ML-% IJ SOLN
INTRAMUSCULAR | Status: AC
  Filled 2011-12-19: qty 2000

## 2011-12-19 MED ORDER — MIDAZOLAM HCL 2 MG/2ML IJ SOLN
INTRAMUSCULAR | Status: AC
  Filled 2011-12-19: qty 2

## 2011-12-19 MED ORDER — VERAPAMIL HCL 2.5 MG/ML IV SOLN
INTRAVENOUS | Status: AC
  Filled 2011-12-19: qty 2

## 2011-12-19 NOTE — H&P (Signed)
History and Physical Interval Note:  NAME:  Destiny Shelton   MRN: [id number] DOB:  1945/08/27   ADMIT DATE: 12/19/2011   12/19/2011 7:57 AM  Destiny Shelton is a 66 y.o. 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  She has been trying to go to Pulmonary Rehab, but continues to have exertional dyspnea - worse than at baseline. She has also noted chest pain with exertion.   She has had a Myoview ST in March of 2012 that was negative for ischemia and an echocardiogram (also in 2012) that was essentially normal with the exception of grade 1 diastolic dysfunction.  I have been following her for a couple of years, and have finally gotten her to Pulmonary Rehab, but now she notes Chest Pain.  She was seen by Nada Boozer at [facility] for an urgent work-in visit.  After discussing with Dr. Clarene Duke, they felt that the best way to determine the etiology of her symptoms was to proceed with R&LHC.  A D-dimer was mildly elevated and CXR confirmed the presence of pulmonary nodules.   Past Medical History  Diagnosis Date  . ALLERGIC RHINITIS   . Sleep apnea   . Hypertension   . Adenomatous colon polyp   . Gastritis   . External hemorrhoids   . Diverticulosis of colon   . Diabetes mellitus type 2 with neurological manifestations     On Insulin  . Depression   . Osteoporosis   . Morbidly obese   . Nephrolithiasis   . Back pain, chronic   . Osteoarthritis   . Anxiety   . Hyperlipemia   . Kidney stones     hx of   . Atrial tachycardia     Mostly Sinus Tachycardia  . Asthma   . COPD mixed type     Requiring Home O2 at 4L  . Inappropriate sinus tachycardia    Past Surgical History  Procedure Date  . Appendectomy 1963  . Cholecystectomy 1963  . Abdominal hysterectomy 1978  . Total knee arthroplasty 1997    right  . Upper gastrointestinal endoscopy 04/09/2006    w/Dilation, gastritis  . Colonoscopy w/ biopsies and polypectomy  07/22/2007, 04/28/2007    2009: diverticulosis, 2008: diverticulosis, adenomatous polyps    FAMHx: Family History  Problem Relation Age of Onset  . Heart disease Mother   . Heart disease Father   . Heart disease Brother   . Stroke Mother   . Stroke Brother   . Skin cancer Brother   . Colon cancer Neg Hx   . Colon polyps Sister   . Diabetes Sister   . Diabetes Brother   . Irritable bowel syndrome Daughter     SOCHx:  reports that she quit smoking about 17 years ago. She has never used smokeless tobacco. She reports that she does not drink alcohol or use illicit drugs.  ALLERGIES: Allergies  Allergen Reactions  . Aspirin Other (See Comments)    SHORTNESS OF BREATH  . Nsaids Other (See Comments)    SHORTNESS OF BREATH    HOME MEDICATIONS: Prescriptions prior to admission  Medication Sig Dispense Refill  . albuterol (VENTOLIN HFA) 108 (90 BASE) MCG/ACT inhaler Inhale 2 puffs into the lungs every 6 (six) hours as needed. For shortness of breath      . alendronate (FOSAMAX) 70 MG tablet Take 70 mg by mouth every 7 (seven) days. Take with a full glass of water  on an empty stomach.       . ALPRAZolam (XANAX) 0.5 MG tablet Take 0.5-1 mg by mouth at bedtime as needed. For anxiety/1/2 - 1 tab by mouth once daily as needed      . AMBULATORY NON FORMULARY MEDICATION Oxygen  As directed       . benzonatate (TESSALON) 200 MG capsule Take 200 mg by mouth 3 (three) times daily as needed. For cough      . budesonide-formoterol (SYMBICORT) 160-4.5 MCG/ACT inhaler Inhale 2 puffs into the lungs 2 (two) times daily.        Marland Kitchen CALCIUM-VITAMIN D PO Take 1 tablet by mouth 2 (two) times daily.       . cyclobenzaprine (FLEXERIL) 10 MG tablet Take 10 mg by mouth at bedtime.       Marland Kitchen diltiazem (MATZIM LA) 420 MG 24 hr tablet Take 420 mg by mouth daily.       Marland Kitchen docusate sodium (COLACE) 100 MG capsule Take 100 mg by mouth 2 (two) times daily.        . DULoxetine (CYMBALTA) 60 MG capsule Take 60 mg by  mouth daily.        . furosemide (LASIX) 20 MG tablet Take 20 mg by mouth daily.       Marland Kitchen gabapentin (NEURONTIN) 600 MG tablet Take 300 mg by mouth daily as needed. For pain/Take 3 tabs By mouth  daily as needed      . hydrocodone-acetaminophen (LORCET-HD) 5-500 MG per capsule Take 1 capsule by mouth every 8 (eight) hours as needed. For pain      . HYDROcodone-homatropine (HYCODAN) 5-1.5 MG/5ML syrup Take 5 mLs by mouth at bedtime as needed. For cough      . insulin aspart (NOVOLOG) 100 UNIT/ML injection Inject 10 Units into the skin 2 (two) times daily.        . insulin glargine (LANTUS) 100 UNIT/ML injection Inject 84 Units into the skin daily.       . metFORMIN (GLUCOPHAGE) 500 MG tablet Take 1,000 mg by mouth 2 (two) times daily with a meal.        . Multiple Vitamin (MULTIVITAMIN) tablet Take 1 tablet by mouth daily.        . Omega-3 Fatty Acids (FISH OIL) 1000 MG CAPS Take 1 capsule by mouth daily.        Marland Kitchen omeprazole (PRILOSEC) 20 MG capsule Take 20 mg by mouth daily.        . potassium chloride (KLOR-CON) 10 MEQ CR tablet Take 20 mEq by mouth 2 (two) times daily. Takes 2 once a day and then one more later in the day      . pravastatin (PRAVACHOL) 40 MG tablet Take 80 mg by mouth daily.        . roflumilast (DALIRESP) 500 MCG TABS tablet Take 500 mcg by mouth daily.        Marland Kitchen tiotropium (SPIRIVA) 18 MCG inhalation capsule Place 18 mcg into inhaler and inhale daily.        . valsartan (DIOVAN) 160 MG tablet Take 80 mg by mouth daily. Take 1/2 tabs by mouth daily      . verapamil (VERELAN PM) 360 MG 24 hr capsule Take 360 mg by mouth daily.      . chlorthalidone (HYGROTON) 25 MG tablet Take 25 mg by mouth daily.       . digoxin (LANOXIN) 0.125 MG tablet Take 125 mcg by mouth daily.      Marland Kitchen  predniSONE (DELTASONE) 10 MG tablet 4 tabs for 2 days, then 3 tabs for 2 days, 2 tabs for 2 days, then 1 tab for 2 days, then stop  20 tablet  0    PHYSICAL EXAM:Blood pressure 122/62, pulse 111,  temperature 97.1 F (36.2 C), temperature source Oral, resp. rate 18, height 5\' 5"  (1.651 m), weight 153.316 kg (338 lb), SpO2 92.00%. General appearance: alert, cooperative, appears stated age and morbidly obese Neck: no adenopathy, no carotid bruit, no JVD, supple, symmetrical, trachea midline and thyroid not enlarged, symmetric, no tenderness/mass/nodules Lungs: clear to auscultation bilaterally, normal percussion bilaterally and diminished BS throughout, accessory muscle use at baseline, mild interstitial sounds throughout; mild end exp wheezing Heart: tachycardic, Regular sinus rhythm.  normal S1/S2, no M/R/G Abdomen: obese, Soft/ NT/ND/NABS; no HSM Extremities: extremities normal, atraumatic, no cyanosis or edema Pulses: 2+ and symmetric Neurologic: Grossly normal; CN II-XII grossly intact.  IMPRESSION & PLAN The patients' history has been reviewed, patient examined, no change in status from most recent note, stable for surgery. I have reviewed the patients' chart and labs. Questions were answered to the patient's satisfaction.    Aysa Larivee Macleod has presented today for surgery, with the diagnosis of chest pain and exertional shortness of breath. The various methods of treatment have been discussed with the patient and family.   Risks / Complications include, but not limited to: Death, MI, CVA/TIA, VF/VT (with defibrillation), Bradycardia (need for temporary pacer placement), contrast induced nephropathy, bleeding / bruising / hematoma / pseudoaneurysm, vascular or coronary injury (with possible emergent CT or Vascular Surgery), adverse medication reactions, infection.     After consideration of risks, benefits and other options for treatment, the patient has consented to Procedure(s):  LEFT HEART CATHETERIZATION AND CORONARY ANGIOGRAPHY +/- AD HOC PERCUTANEOUS CORONARY INTERVENTION RIGHT HEART CATHETERIZATION  as a surgical intervention.   We will proceed with the planned  procedure.   HARDING,DAVID W [facility] [address] [place], [place]  [address]  [phone]  12/19/2011 7:57 AM

## 2011-12-19 NOTE — Brief Op Note (Signed)
12/19/2011  10:57 AM  PATIENT:  Destiny Shelton  66 y.o. 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 She has been trying to go to Pulmonary Rehab, but continues to have exertional dyspnea - worse than at baseline.  She has also noted chest pain with exertion.  She has had a Myoview ST in March of 2012 that was negative for ischemia and an echocardiogram (also in 2012) that was essentially normal with the exception of grade 1 diastolic dysfunction.  I have been following her for a couple of years, and have finally gotten her to Pulmonary Rehab, but now she notes Chest Pain. She was seen by Nada Boozer at [facility] for an urgent work-in visit. After discussing with Dr. Clarene Duke, they felt that the best way to determine the etiology of her symptoms was to proceed with R&LHC. A D-dimer was mildly elevated and CXR confirmed the presence of pulmonary nodules.  PRE-OPERATIVE DIAGNOSIS:  Chest pain of exertion, resting and exertional shortness of breath  POST-OPERATIVE DIAGNOSIS:    No angiographic evidence of obstructive CAD  Elevated & essentially equal RAP, RVEDP, LVEDP and PCWP.  Moderately elevated RVP/PAP ~ 47/16 mmHg (18 mmHg EDP) and 45/29 mmHg (35 mmHg)  Transpulmonary gradient ~10-15 mmHg suggesting a mixed component of Pulmonary HTN   PROCEDURE:  Procedure(s) (LRB): LEFT AND RIGHT HEART CATHETERIZATION WITH CORONARY ANGIOGRAM ()  Surgeon(s) and Role: Marykay Lex, MD - Primary  ANESTHESIA:   local and IV sedation; 0.5mg  Versed, 25mg  Fentanyl  EBL:    < 10 ml  PROCEDURE TECHNIAUQ: 5 Fr short sheat R antecubital Vein - 5 Fr Theone Murdoch for RHC; 5 Fr Glide sheath Right Radial Artery; TIG 4.0 for LCA, RCA Angiography & LHC hemodynamics.   LOCAL MEDICATIONS USED:  LIDOCAINE 2 ml   TR Band: 17 ml Air, 1049 hrs; non-occlusive hemostasis  DICTATION: .Note written in EPIC; See Hemodynamics sheet in shadow chart.  PLAN  OF CARE: Discharge to home after PACU  PATIENT DISPOSITION:  PACU - hemodynamically stable.   Delay start of Pharmacological VTE agent (>24hrs) due to surgical blood loss or risk of bleeding: not applicable

## 2011-12-21 NOTE — CV Procedure (Signed)
[facility]  RIGHT & LEFT HEART CATHETERIZATION  NAME:  Destiny Shelton   MRN: [id number] DOB:  1946/02/12   ADMIT DATE: 12/19/2011  Performing Cardiologist: Marykay Lex, MD Primary Physician: Kari Baars, MD Primary Cardiologist:  Marykay Lex, MD  Procedures Performed:  Left Heart Catheterization via 5 Fr Right Radial Artery access  Native Coronary Angiography  Right Heart Catheretization via Right Antecubital/Brachial Access.  Indication(s): Exertional Angina  Shortness of breath with exertion  History: Destiny Shelton is a 66 y.o. 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 She has been trying to go to Pulmonary Rehab, but continues to have exertional dyspnea - worse than at baseline.  She has also noted chest pain with exertion.  She has had a Myoview ST in March of 2012 that was negative for ischemia and an echocardiogram (also in 2012) that was essentially normal with the exception of grade 1 diastolic dysfunction.  I have been following her for a couple of years, and have finally gotten her to Pulmonary Rehab, but now she notes Chest Pain. She was seen by Nada Boozer at [facility] for an urgent work-in visit. After discussing with Dr. Clarene Duke, they felt that the best way to determine the etiology of her symptoms was to proceed with R&LHC.   Consent: The procedure with Risks/Benefits/Alternatives and Indications was reviewed with the patient (and family).  All questions were answered.    Risks / Complications include, but not limited to: Death, MI, CVA/TIA, VF/VT (with defibrillation), Bradycardia (need for temporary pacer placement), contrast induced nephropathy, bleeding / bruising / hematoma / pseudoaneurysm, vascular or coronary injury (with possible emergent CT or Vascular Surgery), adverse medication reactions, infection.    The patient (and family) voiced understanding  and agree to proceed.    Consent for signed by MD and patient with RN witness -- placed on chart.  Procedure:  The patient was brought to the 2nd Floor [facility] in the fasting state and prepped and draped in the usual sterile fashion for Right groin or radial / brachial access.  A modified Allen's test with plethysmography was performed on the right wrist demonstrating adequate Ulnar Artery collateral flow.  Using sterile technique, a 18 gauge IV was inserted in the Right antecubital vein and capped by the cath lab RN.  Sterile technique was used including antiseptics, cap, gloves, gown, hand hygiene, mask and sheet.  Skin prep: Chlorhexidine;  Time Out: Verified patient identification, verified procedure, site/side was marked, verified correct patient position, special equipment/implants available, medications/allergies/relevent history reviewed, required imaging and test results available.  Performed  Right Heart Catheterization The Right Antecubital IV was then re-prepped.  Using an initial set of sterile gloves and towels, an 0.18 mm wire was advanced through the IV allowing for the exchange over wire for an antimicrobial bonded/coated single lumen 5Fr short sheath in the antecubital vein.  Then a 5 Fr Theone Murdoch Catheter was advanced through the sheath, and with the balloon inflated once in the SVC, was advanced under fluoroscopic guidance into the first the Right Atrium, then through the Right Ventricle into the Main Pulmonary Artery and into the Wedge position.  Hemodynamics measurements were obtained in each location. Simultaneous Oxygen saturation were recorded in both the Pulmonary Artery and the Central Aorta.  Simultaneous pressure measurements were obtained in the PA/PCWP and RV along with LV.  Thermodilution injections were not performed to  calculate Cardiac Output due to the 5 Fr system.    Then the catheter was removed completely out of the body with the  balloon deflated.  The Sheath was flushed with heparinized saline.  Left Heart Catheterization The right wrist was anesthetized with 1% subcutaneous Lidocaine.  The right radial artery was accessed using the Seldinger Technique with placement of a 5 Fr Glide Sheath. The sheath was aspirated and flushed.  Then a total of 10 ml of standard Radial Artery Cocktail (see medications) was infused.  Radial Cocktail: 5 mg Verapamil, 400 mcg NTG, 2 ml 2% Lidocaine in 10 ml NS.  5000 Units of IV Heparin was administered once the catheter reached the ascending aorta.  A 5 Fr TIG Catheter was advanced of over a Versicore wire into the ascending Aorta.  This catheter was was advanced across the Aortic Valve.  LV hemodynamics were measured and Left Ventriculography was performed.  LV hemodynamics were then re-sampled, and the catheter was pulled back across the Aortic Valve for measurement of "pull-back" gradient.  The catheter was used to engage the Left and Right coronary artery.  Multiple cineangiographic views of the Both coronary artery system(s) were performed.  An Left Ventriculogram was not performed as an echocardiogram has been ordered.  The catheter and wire were removed completely out of the body.  Both sheaths were removed in the Cath Lab with a TR band placed at 17 ml Air at 1049 hours.  Reverse Allen's test revealed non-occlusive hemostasis and manual pressure held for the venous access.  The patient was transported to the PACU holding area in a chest pain free, hemodynamically stable condition.   The patient  was stable before, during and following the procedure.   Patient did tolerate procedure well. There were not complications. EBL: < 10ml  Medications:  Premedication: 5mg   Valium,   Sedation:  0.5 mg IV Versed, 25 mcg IV Fentanyl  Contrast:  70ml Omnipaque  IV Heparin 5000 Units  Hemodynamics:  SaO2% - on 4 L O2 by [place] % Pressures  mmHg  mean/EDP  mmHg      Right Atrium  64    22/20     19 m      Right Ventricle  59    46/16   18 EDP       Pulmonary Artery  68    45/29   37 m      PCWP    29/26   22 m      Left ventricle   80    110/11   20 EDP       Central Aorta     115/67   87 m    Cardiac Output:  Cardiac Index:      Fick   10.25    4.1   Qp/Qs   1.27      Resistance:    D/S   D/DI   PVR    68    144   T. PVR    228    486   SVR   531   1131   T. SVR    679    1447    Review of simultaneous RV/LV pressure tracings shows concordance. RA and PCWP wave forms have a relatively normal morphology.  Coronary Angiographic Data:  Left Main:  Large caliber vessel that appears to bifurcate into the LAD and Left Circumflex, although there may also be a Ramus Intermedius / high OM; unable to "  lay out" the terminal LM branch point due to tortuous takeoff of LAD.  Left Anterior Descending (LAD):  Large caliber vessel that gives off one majors, moderate caliber Diag 1 branch then a smaller Diag 2 before bifurcating distally into two equal sized small branches -- a small Diag 3 and the terminal LAD that barely reaches the apex.   No angiographic evidence of CAD.  Circumflex (LCx):  Moderate caliber vessel that tapers down to terminate as a small to moderate Lateral OM after giving off a very small AV Groove branch and a small PL branch.  No angiographic evidence of CAD.  Ramus Intermedius/HIgh OM1:  Small to moderate caliber vessel coursing in an OM distribution.  No angiographic evidence of CAD.  Right Coronary Artery: Very large caliber dominant vessel with mild diffuse luminal irregularities, bifurcates distally into the RPDA and Right Posterior Atrioventricular branch (RPAV).    posterior descending artery: Large caliber vessel with extensive septal perforators that tapers to a small caliber vessel as it reaches up to an apparently around the apex. Diffuse mild luminal irregularities.  posterior lateral branch:  Large caliber vessel that gives off the Av Nodal artery  before bifurcating into two significant / extensive moderate caliber LPL branches.  Diffuse mild luminal irregularities.  Impression:  No angiographic evidence of any significant coronary artery disease to explain chest pain or shortness of breath  Elevated and equal four-chamber pressures with mean Right Atrial, PCWP, as well as LV and RV EDP being roughly 20 mm Hg.  There was concordance of simultaneous pressure. This would suggest against constrictive pericarditis, but would suggest possible restrictive disease.  However a closer review of the strips will follow with off-line evaluation -- entered as an addendum to this note.  Moderate pulmonary hypertension while oxygen (the patient was not able tolerate being off oxygen ) with a Transpulmonary Gradient of 12-15 mmHg suggestive of some pulmonary component.  Significantly reduced arterial saturation while on oxygen suggestive of severe lung disease.  Normal/elevated cardiac output and index consistent with tachycardia.  There is a slight step up and oxygen saturation from RV to PA, but Qp/Qs is not suggestive of significant shunt.  Despite elevated and equalized pressures in the RVEDP/LVEDP/PCWP & RA, there is concordance of LV/RV tracings and no "square root sign" on RA or PCWP wave forms to suggest Constrictive Pericarditis.    Plan:  Order 2-D echocardiogram and consider cardiac MRI to evaluate for possible constrictive pericarditis versus restrictive cardiomyopathy.  Continue to aggressively treat after the reduction and rate control.    Also consider treatment of pulmonary hypertension per recommendation from her pulmonologist.  Time Spend Directly with Patient:  75 minutes  Kennis Wissmann W, M.D., M.S. [facility] [address] [place], [place]  [address]  [phone]  12/21/2011 6:58 AM

## 2011-12-22 LAB — GLUCOSE, CAPILLARY: Glucose-Capillary: 98 mg/dL (ref 70–99)

## 2011-12-22 LAB — POCT I-STAT 3, VENOUS BLOOD GAS (G3P V)
Acid-Base Excess: 8 mmol/L — ABNORMAL HIGH (ref 0.0–2.0)
pO2, Ven: 35 mmHg (ref 30.0–45.0)

## 2011-12-23 ENCOUNTER — Encounter (HOSPITAL_COMMUNITY)
Admission: RE | Admit: 2011-12-23 | Discharge: 2011-12-23 | Disposition: A | Discharge status: Home / Self Care | Payer: Medicare Other | Source: Ambulatory Visit | Attending: Pulmonary Disease | Admitting: Pulmonary Disease

## 2011-12-25 ENCOUNTER — Encounter (HOSPITAL_COMMUNITY)
Admission: RE | Admit: 2011-12-25 | Discharge: 2011-12-25 | Disposition: A | Discharge status: Home / Self Care | Payer: Medicare Other | Source: Ambulatory Visit | Attending: Pulmonary Disease | Admitting: Pulmonary Disease

## 2011-12-25 NOTE — Progress Notes (Signed)
Destiny Shelton exercised today in Pulmonary Rehab, had increased heart rate, exercise decreased.  She recently has been on medications from Dr. Dorethea Clan for tachycardia, she is scheduled for Echocardiogam next week.  We will fax exercise records to Dr. Dorethea Clan.

## 2011-12-30 ENCOUNTER — Telehealth: Payer: Self-pay | Admitting: Pulmonary Disease

## 2011-12-30 ENCOUNTER — Other Ambulatory Visit (HOSPITAL_COMMUNITY): Payer: Self-pay | Admitting: Cardiology

## 2011-12-30 ENCOUNTER — Encounter (HOSPITAL_COMMUNITY)
Admission: RE | Admit: 2011-12-30 | Discharge: 2011-12-30 | Payer: Medicare Other | Source: Ambulatory Visit | Attending: Pulmonary Disease | Admitting: Pulmonary Disease

## 2011-12-30 DIAGNOSIS — R0602 Shortness of breath: Secondary | ICD-10-CM

## 2011-12-30 NOTE — Telephone Encounter (Signed)
I spoke with Destiny Shelton and she states the pt was a t rehab today and was having sinus tachycardia, with resting HR at 128. Destiny Shelton states the pt is c/o feeling more tired. She states she only has SOB with exertion. Destiny Shelton called and spoke with Destiny Boozer, FNP with [facility] and she stated that the pt has a heart cath that was clean and she is set for an Echo tomorrow. Destiny Shelton advised they contact Destiny Shelton to get his rec because they feel her tachycardia is due to her lungs. Destiny Shelton states the pt is afebrile, lungs sounds are good and sats are 90-92% on 6 liters at rest. Destiny Shelton states cardiology also did a d-dimer at the pt last OV and it was 0.06. Pt is taking verapamil and digoxin as directed.  Destiny Shelton has recommended that the pt not return to rehab the rest of the week.  The pt is going home and we can contact her there if needed. Fax with pt EKG and HR record were placed in [facility] look at folder. Please advise. Destiny Shelton, CMA

## 2011-12-30 NOTE — Telephone Encounter (Signed)
I spoke with pt spouse and advised. I will forward message to Lawson Fiscal to get Echo results for [facility] tomorrow.Carron Curie, CMA

## 2011-12-30 NOTE — Telephone Encounter (Signed)
Let pt know that I have reviewed things.  It would be very unusual for a lung problem to cause a RESTING tachycardia (tell pt this).  Lung problems do cause elevated heart rates with exercise/exertion.  I would like to see what her echo shows that was done today.  Agree with not going back to rehab until this is sorted out.

## 2011-12-30 NOTE — Telephone Encounter (Signed)
Destiny Shelton called back & stated that pt's HR before leaving is at 115.  [name]

## 2011-12-30 NOTE — Telephone Encounter (Signed)
Destiny Shelton returned our call Destiny Shelton

## 2011-12-30 NOTE — Progress Notes (Addendum)
Destiny Shelton's heart rate is 128 Sinus Tach Patient placed on the zoll.  Blood pressure 98/50 SA02 90-92% on 4L nasal oxymyzer.  Patient denies SOB. Lung fields essentially clear upon auscultation. Dr Elissa Hefty office called and notified. Will not have patient exercise today due to elevated heart rate. Nada Boozer FNP-c notified about patients tachycardia. Vernona Rieger agreed the patient should not exercise today. Dr Awanda Mink office called and notified. Faxed exercise flow sheets and ECG tracing's to Dr. Teddy Spike office for review.

## 2011-12-30 NOTE — Progress Notes (Signed)
Acute ov reviewed and agree with plan as outlined.  The pt has moderate copd by pfts' 2011, and suspect her dypsnea is multifactorial.  Her persistent tachy is an issue for her, and needs to be addressed.

## 2011-12-30 NOTE — Progress Notes (Signed)
Talked with Destiny Shelton at Dr [facility] office fax received with Zoll tracing's exercise flow sheets from Pulmonary Rehab. Exit heart rate 115.  Destiny Shelton left Pulmonary rehab without complaints. Dr Teddy Spike office will contact Destiny Shelton at home if necessary. Destiny Shelton is scheduled to have an echocardiogram at [facility] tomorrow.

## 2011-12-30 NOTE — Telephone Encounter (Signed)
Form placed in [facility] look at folder.

## 2011-12-31 ENCOUNTER — Encounter (HOSPITAL_COMMUNITY): Payer: Self-pay

## 2011-12-31 ENCOUNTER — Ambulatory Visit (HOSPITAL_COMMUNITY)
Admission: RE | Admit: 2011-12-31 | Discharge: 2011-12-31 | Disposition: A | Discharge status: Home / Self Care | Payer: Medicare Other | Source: Ambulatory Visit | Attending: Cardiology | Admitting: Cardiology

## 2011-12-31 DIAGNOSIS — R0602 Shortness of breath: Secondary | ICD-10-CM | POA: Diagnosis not present

## 2011-12-31 DIAGNOSIS — R079 Chest pain, unspecified: Secondary | ICD-10-CM | POA: Diagnosis not present

## 2011-12-31 DIAGNOSIS — R5383 Other fatigue: Secondary | ICD-10-CM | POA: Insufficient documentation

## 2011-12-31 DIAGNOSIS — R5381 Other malaise: Secondary | ICD-10-CM | POA: Insufficient documentation

## 2011-12-31 DIAGNOSIS — J984 Other disorders of lung: Secondary | ICD-10-CM | POA: Diagnosis not present

## 2011-12-31 DIAGNOSIS — J9819 Other pulmonary collapse: Secondary | ICD-10-CM | POA: Diagnosis not present

## 2011-12-31 MED ORDER — IOHEXOL 350 MG/ML SOLN
90.0000 mL | Freq: Once | INTRAVENOUS | Status: AC | PRN
  Administered 2011-12-31: 90 mL via INTRAVENOUS

## 2012-01-01 ENCOUNTER — Encounter (HOSPITAL_COMMUNITY): Admission: RE | Admit: 2012-01-01 | Payer: Medicare Other | Source: Ambulatory Visit

## 2012-01-01 NOTE — Telephone Encounter (Signed)
Echo results received and placed in [facility] look at.

## 2012-01-01 NOTE — Telephone Encounter (Signed)
I spoke with Dr. Herbie Baltimore office and they will fax over Echo results to triage fax for [facility].

## 2012-01-02 ENCOUNTER — Encounter: Payer: Self-pay | Admitting: Pulmonary Disease

## 2012-01-02 NOTE — Telephone Encounter (Signed)
Called spoke with patient, informed her that [facility] has received the echo results and we will call her once he reviews the results.  Pt okay with this and verbalized her understanding.  Will forward back to [facility].

## 2012-01-05 NOTE — Telephone Encounter (Signed)
Ok to cancel ct in sept Would wait on pulmonary rehab until i speak with cardiology

## 2012-01-05 NOTE — Telephone Encounter (Signed)
Patient calling back about results.

## 2012-01-05 NOTE — Telephone Encounter (Signed)
Let her know that I need to discuss things with Dr. Herbie Baltimore before I call her.  Trying to do this today.

## 2012-01-05 NOTE — Telephone Encounter (Signed)
Called, spoke with pt.  Informed her of below per Dr. Shelle Iron.  She verbalized understanding of this.  When we call her back, she would like to know if she can go back to pulmonary rehab.  Also, states she is supposed to have a CT Chest done in Sept.  Would like to know if this can be cancelled because she had a scan done on 12/31/11.  Will forward msg back to [facility] to address.

## 2012-01-05 NOTE — Telephone Encounter (Signed)
Order for CT has been cancelled and pt aware of [facility] recs. She voiced her understanding and needed nothing further was needed

## 2012-01-06 ENCOUNTER — Encounter (HOSPITAL_COMMUNITY): Payer: Medicare Other

## 2012-01-08 ENCOUNTER — Telehealth: Payer: Self-pay | Admitting: Pulmonary Disease

## 2012-01-08 ENCOUNTER — Encounter (HOSPITAL_COMMUNITY): Payer: Medicare Other

## 2012-01-08 ENCOUNTER — Encounter: Payer: Self-pay | Admitting: Pulmonary Disease

## 2012-01-08 NOTE — Telephone Encounter (Signed)
Spoke with pt about heart cath, her tachycardia, and severe doe.  I do not think this is a primary lung issue.  She has mild to moderate copd, but clearly has something cardiac going on.  Spoke with Dr. Herbie Baltimore, he is concerned about a possible restrictive CM, and will arrange for cardiac MRI.  Further cardiac w/u pending.

## 2012-01-12 DIAGNOSIS — I10 Essential (primary) hypertension: Secondary | ICD-10-CM | POA: Diagnosis not present

## 2012-01-12 DIAGNOSIS — E1149 Type 2 diabetes mellitus with other diabetic neurological complication: Secondary | ICD-10-CM | POA: Diagnosis not present

## 2012-01-12 DIAGNOSIS — J449 Chronic obstructive pulmonary disease, unspecified: Secondary | ICD-10-CM | POA: Diagnosis not present

## 2012-01-12 DIAGNOSIS — E785 Hyperlipidemia, unspecified: Secondary | ICD-10-CM | POA: Diagnosis not present

## 2012-01-13 ENCOUNTER — Encounter (HOSPITAL_COMMUNITY): Payer: Medicare Other

## 2012-01-14 ENCOUNTER — Other Ambulatory Visit (HOSPITAL_COMMUNITY): Payer: Self-pay | Admitting: Cardiology

## 2012-01-14 DIAGNOSIS — I421 Obstructive hypertrophic cardiomyopathy: Secondary | ICD-10-CM

## 2012-01-15 ENCOUNTER — Encounter (HOSPITAL_COMMUNITY): Payer: Medicare Other

## 2012-01-15 ENCOUNTER — Encounter: Payer: Self-pay | Admitting: Cardiology

## 2012-01-20 ENCOUNTER — Encounter (HOSPITAL_COMMUNITY): Payer: Medicare Other

## 2012-01-22 ENCOUNTER — Encounter (HOSPITAL_COMMUNITY): Payer: Medicare Other

## 2012-01-23 ENCOUNTER — Inpatient Hospital Stay (HOSPITAL_COMMUNITY)
Admission: RE | Admit: 2012-01-23 | Discharge: 2012-01-23 | Payer: Medicare Other | Source: Ambulatory Visit | Attending: Cardiology | Admitting: Cardiology

## 2012-01-27 ENCOUNTER — Encounter (HOSPITAL_COMMUNITY): Payer: Medicare Other

## 2012-01-29 ENCOUNTER — Encounter (HOSPITAL_COMMUNITY): Payer: Medicare Other

## 2012-01-30 ENCOUNTER — Ambulatory Visit (HOSPITAL_COMMUNITY)
Admission: RE | Admit: 2012-01-30 | Discharge: 2012-01-30 | Disposition: A | Discharge status: Home / Self Care | Payer: Medicare Other | Source: Ambulatory Visit | Attending: Cardiology | Admitting: Cardiology

## 2012-01-30 DIAGNOSIS — I421 Obstructive hypertrophic cardiomyopathy: Secondary | ICD-10-CM

## 2012-01-30 DIAGNOSIS — I428 Other cardiomyopathies: Secondary | ICD-10-CM | POA: Diagnosis not present

## 2012-01-30 MED ORDER — GADOBENATE DIMEGLUMINE 529 MG/ML IV SOLN
35.0000 mL | Freq: Once | INTRAVENOUS | Status: AC
  Administered 2012-01-30: 35 mL via INTRAVENOUS

## 2012-02-03 ENCOUNTER — Encounter (HOSPITAL_COMMUNITY): Admission: RE | Admit: 2012-02-03 | Payer: Medicare Other | Source: Ambulatory Visit

## 2012-02-05 ENCOUNTER — Encounter (HOSPITAL_COMMUNITY): Payer: Medicare Other

## 2012-02-10 ENCOUNTER — Encounter (HOSPITAL_COMMUNITY): Payer: Medicare Other

## 2012-02-12 ENCOUNTER — Encounter (HOSPITAL_COMMUNITY): Payer: Medicare Other

## 2012-02-17 ENCOUNTER — Encounter (HOSPITAL_COMMUNITY): Payer: Medicare Other

## 2012-02-19 ENCOUNTER — Encounter: Payer: Self-pay | Admitting: Pulmonary Disease

## 2012-02-19 ENCOUNTER — Ambulatory Visit (INDEPENDENT_AMBULATORY_CARE_PROVIDER_SITE_OTHER): Payer: Medicare Other | Admitting: Pulmonary Disease

## 2012-02-19 ENCOUNTER — Other Ambulatory Visit: Payer: Self-pay | Admitting: Pulmonary Disease

## 2012-02-19 VITALS — BP 142/72 | HR 133 | Temp 98.3°F | Ht 66.0 in | Wt 239.6 lb

## 2012-02-19 DIAGNOSIS — Z23 Encounter for immunization: Secondary | ICD-10-CM

## 2012-02-19 DIAGNOSIS — R0602 Shortness of breath: Secondary | ICD-10-CM

## 2012-02-19 DIAGNOSIS — J449 Chronic obstructive pulmonary disease, unspecified: Secondary | ICD-10-CM | POA: Diagnosis not present

## 2012-02-19 NOTE — Assessment & Plan Note (Signed)
The patient continues to have significant dyspnea on exertion that is multifactorial.  Her level of dyspnea is significantly out of proportion to her degree of lung disease, and clearly she has elevated left-sided heart pressures with persistent resting tachycardia that has yet to be explained.  She obviously has an underlying cardiac problem that has not been identified.  The only thing left to do from my standpoint is to evaluate for possible intrapulmonary shunting.  I think this is unlikely, but will do a transcranial Doppler bubble study in order to be complete.  I really have nothing else to offer her from a pulmonary standpoint, and I have offered to refer her to a tertiary care center for further evaluation but doubt this would add much.

## 2012-02-19 NOTE — Assessment & Plan Note (Signed)
The patient is on a very good bronchodilator regimen, and has no bronchospasm on exam today.  She has not had a recent pulmonary infection.  I have asked her to continue on her current medications, and will send her back to pulmonary rehabilitation.

## 2012-02-19 NOTE — Addendum Note (Signed)
Addended by: Reynaldo Minium C on: 02/19/2012 12:15 PM   Modules accepted: Orders

## 2012-02-19 NOTE — Patient Instructions (Addendum)
Continue current breathing medications and oxygen Will refer back to pulmonary rehab Will give you the flu shot today. Will arrange for ultrasound test to make sure your bloodflow thru lungs is not abnormal.  followup with me in 4 mos

## 2012-02-19 NOTE — Progress Notes (Signed)
  Subjective:    Patient ID: Destiny Shelton, female    DOB: 06-03-1945, 66 y.o.   MRN: [id number]  HPI The patient comes in today for followup of her known COPD and chronic dyspnea on exertion with hypoxemia.  She continues on a very good bronchodilator regimen, and has not had any recent pulmonary infection.  She has continued to have a resting tachycardia even when she is not short of breath, and has had a cardiac workup with nothing being found.  However, she has had a right heart catheterization which showed elevated left-sided pressures with pulmonary venous hypertension.  She has had a recent MRI to rule out a restrictive/infiltrative process.  She has had no improvement in her breathing over time, and is anxious to get back to pulmonary rehabilitation.   Review of Systems  Constitutional: Negative for fever and unexpected weight change.  HENT: Positive for nosebleeds and congestion. Negative for ear pain, sore throat, rhinorrhea, sneezing, trouble swallowing, dental problem, postnasal drip and sinus pressure.   Eyes: Negative for redness and itching.  Respiratory: Positive for cough, chest tightness, shortness of breath and wheezing.   Cardiovascular: Positive for chest pain. Negative for palpitations and leg swelling.  Gastrointestinal: Positive for nausea. Negative for vomiting.  Genitourinary: Negative for dysuria.  Musculoskeletal: Negative for joint swelling.  Skin: Negative for rash.  Neurological: Positive for headaches.  Hematological: Does not bruise/bleed easily.  Psychiatric/Behavioral: Negative for dysphoric mood. The patient is not nervous/anxious.        Objective:   Physical Exam Obese female in no acute distress Nose without purulence or discharge noted Chest with decreased breath sounds, no wheezes or rhonchi Cardiac exam with a tachycardic but regular rhythm  Lower extremities with minimal edema, no cyanosis Alert and oriented, moves all 4 extremities.        Assessment & Plan:

## 2012-02-24 ENCOUNTER — Encounter (HOSPITAL_COMMUNITY)
Admission: RE | Admit: 2012-02-24 | Discharge: 2012-02-24 | Disposition: A | Discharge status: Home / Self Care | Payer: Medicare Other | Source: Ambulatory Visit | Attending: Pulmonary Disease | Admitting: Pulmonary Disease

## 2012-02-24 DIAGNOSIS — Z9981 Dependence on supplemental oxygen: Secondary | ICD-10-CM | POA: Insufficient documentation

## 2012-02-24 DIAGNOSIS — E119 Type 2 diabetes mellitus without complications: Secondary | ICD-10-CM | POA: Insufficient documentation

## 2012-02-24 DIAGNOSIS — J449 Chronic obstructive pulmonary disease, unspecified: Secondary | ICD-10-CM | POA: Diagnosis not present

## 2012-02-24 DIAGNOSIS — I10 Essential (primary) hypertension: Secondary | ICD-10-CM | POA: Diagnosis not present

## 2012-02-24 DIAGNOSIS — J984 Other disorders of lung: Secondary | ICD-10-CM | POA: Insufficient documentation

## 2012-02-24 DIAGNOSIS — R0609 Other forms of dyspnea: Secondary | ICD-10-CM | POA: Diagnosis not present

## 2012-02-24 DIAGNOSIS — G4722 Circadian rhythm sleep disorder, advanced sleep phase type: Secondary | ICD-10-CM | POA: Insufficient documentation

## 2012-02-24 DIAGNOSIS — Z5189 Encounter for other specified aftercare: Secondary | ICD-10-CM | POA: Diagnosis not present

## 2012-02-24 DIAGNOSIS — J4489 Other specified chronic obstructive pulmonary disease: Secondary | ICD-10-CM | POA: Insufficient documentation

## 2012-02-24 DIAGNOSIS — R0989 Other specified symptoms and signs involving the circulatory and respiratory systems: Secondary | ICD-10-CM | POA: Insufficient documentation

## 2012-02-25 NOTE — Progress Notes (Signed)
Late entry :  Destiny Shelton returned to exercise after several weeks absence for evaluation of tachycardia.  Her heart rate at entry today was 123.  Her oxygen saturations dropped significantly when walking short distance between equipment, low 80s on 4L continuous oxygen with nasal oxymizer. Her oxygen was increased to 6L and saturations were still in low to mid 80s with short ambulation. We will monitor closely on Thurs and report to Dr. Shelle Iron if this is consistent.

## 2012-02-26 ENCOUNTER — Encounter (HOSPITAL_COMMUNITY)
Admission: RE | Admit: 2012-02-26 | Discharge: 2012-02-26 | Disposition: A | Discharge status: Home / Self Care | Payer: Medicare Other | Source: Ambulatory Visit | Attending: Pulmonary Disease | Admitting: Pulmonary Disease

## 2012-03-02 ENCOUNTER — Encounter (HOSPITAL_COMMUNITY)
Admission: RE | Admit: 2012-03-02 | Discharge: 2012-03-02 | Disposition: A | Discharge status: Home / Self Care | Payer: Medicare Other | Source: Ambulatory Visit | Attending: Pulmonary Disease | Admitting: Pulmonary Disease

## 2012-03-02 DIAGNOSIS — E119 Type 2 diabetes mellitus without complications: Secondary | ICD-10-CM | POA: Insufficient documentation

## 2012-03-02 DIAGNOSIS — R0989 Other specified symptoms and signs involving the circulatory and respiratory systems: Secondary | ICD-10-CM | POA: Insufficient documentation

## 2012-03-02 DIAGNOSIS — G9389 Other specified disorders of brain: Secondary | ICD-10-CM | POA: Diagnosis not present

## 2012-03-02 DIAGNOSIS — R0609 Other forms of dyspnea: Secondary | ICD-10-CM | POA: Insufficient documentation

## 2012-03-02 DIAGNOSIS — J984 Other disorders of lung: Secondary | ICD-10-CM | POA: Insufficient documentation

## 2012-03-02 DIAGNOSIS — J449 Chronic obstructive pulmonary disease, unspecified: Secondary | ICD-10-CM | POA: Insufficient documentation

## 2012-03-02 DIAGNOSIS — Z5189 Encounter for other specified aftercare: Secondary | ICD-10-CM | POA: Insufficient documentation

## 2012-03-02 DIAGNOSIS — I10 Essential (primary) hypertension: Secondary | ICD-10-CM | POA: Insufficient documentation

## 2012-03-02 DIAGNOSIS — J4489 Other specified chronic obstructive pulmonary disease: Secondary | ICD-10-CM | POA: Insufficient documentation

## 2012-03-02 DIAGNOSIS — G4722 Circadian rhythm sleep disorder, advanced sleep phase type: Secondary | ICD-10-CM | POA: Insufficient documentation

## 2012-03-02 DIAGNOSIS — Z9981 Dependence on supplemental oxygen: Secondary | ICD-10-CM | POA: Insufficient documentation

## 2012-03-02 DIAGNOSIS — Q211 Atrial septal defect: Secondary | ICD-10-CM | POA: Diagnosis not present

## 2012-03-02 DIAGNOSIS — Q283 Other malformations of cerebral vessels: Secondary | ICD-10-CM | POA: Diagnosis not present

## 2012-03-04 ENCOUNTER — Telehealth: Payer: Self-pay | Admitting: Pulmonary Disease

## 2012-03-04 ENCOUNTER — Encounter (HOSPITAL_COMMUNITY)
Admission: RE | Admit: 2012-03-04 | Discharge: 2012-03-04 | Disposition: A | Discharge status: Home / Self Care | Payer: Medicare Other | Source: Ambulatory Visit | Attending: Pulmonary Disease | Admitting: Pulmonary Disease

## 2012-03-04 NOTE — Telephone Encounter (Signed)
Pt states she had a doppler on Tuesday and is requesting the results. Please advise if you have these results in your look-at. If not then we can call for results. Carron Curie, CMA

## 2012-03-05 NOTE — Telephone Encounter (Signed)
I called [facility] and the report is not ready. Pt notified of this and that we will call when results are ready.

## 2012-03-09 ENCOUNTER — Encounter (HOSPITAL_COMMUNITY)
Admission: RE | Admit: 2012-03-09 | Discharge: 2012-03-09 | Disposition: A | Discharge status: Home / Self Care | Payer: Medicare Other | Source: Ambulatory Visit | Attending: Pulmonary Disease | Admitting: Pulmonary Disease

## 2012-03-10 ENCOUNTER — Telehealth: Payer: Self-pay | Admitting: Pulmonary Disease

## 2012-03-10 NOTE — Telephone Encounter (Signed)
Is not in my results folder.  Would call over there and see if the can fax.  Thanks.

## 2012-03-10 NOTE — Telephone Encounter (Signed)
KC, please advise if have gotten results from [facility] on patient; had Transcranial Doppler with bubbles. Thanks.

## 2012-03-11 ENCOUNTER — Encounter: Payer: Self-pay | Admitting: Pulmonary Disease

## 2012-03-11 ENCOUNTER — Encounter (HOSPITAL_COMMUNITY)
Admission: RE | Admit: 2012-03-11 | Discharge: 2012-03-11 | Disposition: A | Discharge status: Home / Self Care | Payer: Medicare Other | Source: Ambulatory Visit | Attending: Pulmonary Disease | Admitting: Pulmonary Disease

## 2012-03-11 NOTE — Telephone Encounter (Signed)
Called [facility] at [phone]. Receptionist was unsure if results wre available. I was transferred to medical records and have to leave a msg. Ill await callback.

## 2012-03-11 NOTE — Telephone Encounter (Signed)
LMOM for Destiny Shelton in medical records at [facility].

## 2012-03-11 NOTE — Telephone Encounter (Signed)
Lupita Leash returned my call and she is faxing over results.  Results received and placed in [facility] results folder.

## 2012-03-12 NOTE — Telephone Encounter (Signed)
Discussed with pt and with Dr. Herbie Baltimore her cardiologist.  He is considering TEE vs shunt run.  Pt has an apptm with him next week to discuss.

## 2012-03-15 DIAGNOSIS — I472 Ventricular tachycardia: Secondary | ICD-10-CM | POA: Diagnosis not present

## 2012-03-15 DIAGNOSIS — R0602 Shortness of breath: Secondary | ICD-10-CM | POA: Diagnosis not present

## 2012-03-15 DIAGNOSIS — I10 Essential (primary) hypertension: Secondary | ICD-10-CM | POA: Diagnosis not present

## 2012-03-16 ENCOUNTER — Encounter (HOSPITAL_COMMUNITY)
Admission: RE | Admit: 2012-03-16 | Discharge: 2012-03-16 | Disposition: A | Discharge status: Home / Self Care | Payer: Medicare Other | Source: Ambulatory Visit | Attending: Pulmonary Disease | Admitting: Pulmonary Disease

## 2012-03-17 DIAGNOSIS — Z01818 Encounter for other preprocedural examination: Secondary | ICD-10-CM | POA: Diagnosis not present

## 2012-03-17 DIAGNOSIS — D689 Coagulation defect, unspecified: Secondary | ICD-10-CM | POA: Diagnosis not present

## 2012-03-18 ENCOUNTER — Encounter (HOSPITAL_COMMUNITY)
Admission: RE | Admit: 2012-03-18 | Discharge: 2012-03-18 | Disposition: A | Discharge status: Home / Self Care | Payer: Medicare Other | Source: Ambulatory Visit | Attending: Pulmonary Disease | Admitting: Pulmonary Disease

## 2012-03-18 ENCOUNTER — Other Ambulatory Visit: Payer: Self-pay | Admitting: Internal Medicine

## 2012-03-22 ENCOUNTER — Encounter (HOSPITAL_COMMUNITY): Payer: Self-pay | Admitting: Pharmacy Technician

## 2012-03-23 ENCOUNTER — Encounter (HOSPITAL_COMMUNITY): Payer: Self-pay

## 2012-03-23 ENCOUNTER — Encounter (HOSPITAL_COMMUNITY)
Admission: RE | Admit: 2012-03-23 | Discharge: 2012-03-23 | Disposition: A | Discharge status: Home / Self Care | Payer: Medicare Other | Source: Ambulatory Visit | Attending: Internal Medicine | Admitting: Internal Medicine

## 2012-03-23 ENCOUNTER — Encounter (HOSPITAL_COMMUNITY)
Admission: RE | Admit: 2012-03-23 | Discharge: 2012-03-23 | Disposition: A | Discharge status: Home / Self Care | Payer: Medicare Other | Source: Ambulatory Visit | Attending: Pulmonary Disease | Admitting: Pulmonary Disease

## 2012-03-23 DIAGNOSIS — I10 Essential (primary) hypertension: Secondary | ICD-10-CM | POA: Diagnosis not present

## 2012-03-23 DIAGNOSIS — Z794 Long term (current) use of insulin: Secondary | ICD-10-CM | POA: Diagnosis not present

## 2012-03-23 DIAGNOSIS — Z9981 Dependence on supplemental oxygen: Secondary | ICD-10-CM | POA: Diagnosis not present

## 2012-03-23 DIAGNOSIS — R0609 Other forms of dyspnea: Secondary | ICD-10-CM | POA: Diagnosis not present

## 2012-03-23 DIAGNOSIS — I2789 Other specified pulmonary heart diseases: Secondary | ICD-10-CM | POA: Diagnosis not present

## 2012-03-23 DIAGNOSIS — J449 Chronic obstructive pulmonary disease, unspecified: Secondary | ICD-10-CM | POA: Diagnosis not present

## 2012-03-23 DIAGNOSIS — Z01812 Encounter for preprocedural laboratory examination: Secondary | ICD-10-CM | POA: Diagnosis not present

## 2012-03-23 HISTORY — DX: Gastro-esophageal reflux disease without esophagitis: K21.9

## 2012-03-23 HISTORY — DX: Fibromyalgia: M79.7

## 2012-03-23 HISTORY — DX: Myoneural disorder, unspecified: G70.9

## 2012-03-23 HISTORY — DX: Headache: R51

## 2012-03-23 LAB — CBC
HCT: 34.9 % — ABNORMAL LOW (ref 36.0–46.0)
MCHC: 28.9 g/dL — ABNORMAL LOW (ref 30.0–36.0)
Platelets: 304 10*3/uL (ref 150–400)
RDW: 17.6 % — ABNORMAL HIGH (ref 11.5–15.5)
WBC: 13.5 10*3/uL — ABNORMAL HIGH (ref 4.0–10.5)

## 2012-03-23 LAB — BASIC METABOLIC PANEL
BUN: 14 mg/dL (ref 6–23)
Chloride: 99 mEq/L (ref 96–112)
GFR calc Af Amer: >90 mL/min (ref >90)
GFR calc non Af Amer: 86 mL/min — ABNORMAL LOW (ref >90)
Potassium: 4.2 mEq/L (ref 3.5–5.1)
Sodium: 138 mEq/L (ref 135–145)

## 2012-03-23 NOTE — Progress Notes (Signed)
Contacted Dr. Gelene Mink, anesthesiology to discuss cardiac and pulmonary history.  State ok to proceed with surgery.

## 2012-03-23 NOTE — Pre-Procedure Instructions (Signed)
[address] Ter[address] Boerema [facility]  03/23/2012   Your procedure is scheduled on:  Wednesday March 24, 2102  Report to [facility] at 12:00 AM.  Call this number if you have problems the morning of surgery: [phone]   Remember:   Do not eat food or drink :After Midnight.      Take these medicines the morning of surgery with A SIP OF WATER: albuterol (bring with you day of procedure), xanax (if needed), Tessalon, symbicort, digoxin, cymbalta, gabapentin, hydrocodone( if needed), prilosec, spiriva, roflumilast   Do not wear jewelry, make-up or nail polish.  Do not wear lotions, powders, or perfumes.  Do not shave 48 hours prior to surgery. Men may shave face and neck.  Do not bring valuables to the hospital.  Contacts, dentures or bridgework may not be worn into surgery.  Leave suitcase in the car. After surgery it may be brought to your room.  For patients admitted to the hospital, checkout time is 11:00 AM the day of discharge.   Patients discharged the day of surgery will not be allowed to drive home.  Name and phone number of your driver: family / friend  Special Instructions: Shower using CHG 2 nights before surgery and the night before surgery.  If you shower the day of surgery use CHG.  Use special wash - you have one bottle of CHG for all showers.  You should use approximately 1/3 of the bottle for each shower.   Please read over the following fact sheets that you were given: Pain Booklet, Coughing and Deep Breathing, MRSA Information and Surgical Site Infection Prevention

## 2012-03-24 ENCOUNTER — Ambulatory Visit (HOSPITAL_COMMUNITY): Payer: Medicare Other

## 2012-03-24 ENCOUNTER — Ambulatory Visit (HOSPITAL_COMMUNITY)
Admission: RE | Admit: 2012-03-24 | Discharge: 2012-03-24 | Disposition: A | Discharge status: Home / Self Care | Payer: Medicare Other | Source: Ambulatory Visit | Attending: Internal Medicine | Admitting: Internal Medicine

## 2012-03-24 ENCOUNTER — Encounter (HOSPITAL_COMMUNITY): Payer: Self-pay

## 2012-03-24 ENCOUNTER — Encounter (HOSPITAL_COMMUNITY)
Admission: RE | Disposition: A | Discharge status: Home / Self Care | Payer: Self-pay | Source: Ambulatory Visit | Attending: Internal Medicine

## 2012-03-24 ENCOUNTER — Encounter (HOSPITAL_COMMUNITY): Payer: Self-pay | Admitting: *Deleted

## 2012-03-24 DIAGNOSIS — J449 Chronic obstructive pulmonary disease, unspecified: Secondary | ICD-10-CM | POA: Insufficient documentation

## 2012-03-24 DIAGNOSIS — Z794 Long term (current) use of insulin: Secondary | ICD-10-CM | POA: Insufficient documentation

## 2012-03-24 DIAGNOSIS — K219 Gastro-esophageal reflux disease without esophagitis: Secondary | ICD-10-CM | POA: Diagnosis not present

## 2012-03-24 DIAGNOSIS — I2789 Other specified pulmonary heart diseases: Secondary | ICD-10-CM | POA: Diagnosis not present

## 2012-03-24 DIAGNOSIS — Z01812 Encounter for preprocedural laboratory examination: Secondary | ICD-10-CM | POA: Diagnosis not present

## 2012-03-24 DIAGNOSIS — I270 Primary pulmonary hypertension: Secondary | ICD-10-CM | POA: Diagnosis not present

## 2012-03-24 DIAGNOSIS — Z9981 Dependence on supplemental oxygen: Secondary | ICD-10-CM | POA: Insufficient documentation

## 2012-03-24 DIAGNOSIS — I10 Essential (primary) hypertension: Secondary | ICD-10-CM | POA: Insufficient documentation

## 2012-03-24 DIAGNOSIS — R0609 Other forms of dyspnea: Secondary | ICD-10-CM | POA: Insufficient documentation

## 2012-03-24 DIAGNOSIS — J4489 Other specified chronic obstructive pulmonary disease: Secondary | ICD-10-CM | POA: Insufficient documentation

## 2012-03-24 DIAGNOSIS — R0602 Shortness of breath: Secondary | ICD-10-CM | POA: Diagnosis not present

## 2012-03-24 DIAGNOSIS — R0989 Other specified symptoms and signs involving the circulatory and respiratory systems: Secondary | ICD-10-CM | POA: Insufficient documentation

## 2012-03-24 HISTORY — PX: TEE WITHOUT CARDIOVERSION: SHX5443

## 2012-03-24 LAB — GLUCOSE, CAPILLARY: Glucose-Capillary: 120 mg/dL — ABNORMAL HIGH (ref 70–99)

## 2012-03-24 SURGERY — ECHOCARDIOGRAM, TRANSESOPHAGEAL
Anesthesia: General

## 2012-03-24 MED ORDER — LIDOCAINE HCL (CARDIAC) 20 MG/ML IV SOLN
INTRAVENOUS | Status: DC | PRN
  Administered 2012-03-24: 25 mg via INTRAVENOUS

## 2012-03-24 MED ORDER — ONDANSETRON HCL 4 MG/2ML IJ SOLN: INTRAMUSCULAR | Status: DC | PRN

## 2012-03-24 MED ORDER — ALBUTEROL SULFATE HFA 108 (90 BASE) MCG/ACT IN AERS
INHALATION_SPRAY | RESPIRATORY_TRACT | Status: DC | PRN
  Administered 2012-03-24: 2 via RESPIRATORY_TRACT

## 2012-03-24 MED ORDER — LACTATED RINGERS IV SOLN
INTRAVENOUS | Status: DC
  Administered 2012-03-24: 14:00:00 via INTRAVENOUS

## 2012-03-24 MED ORDER — SODIUM CHLORIDE 0.9 % IV SOLN: INTRAVENOUS | Status: DC

## 2012-03-24 MED ORDER — ONDANSETRON HCL 4 MG/2ML IJ SOLN
INTRAMUSCULAR | Status: DC | PRN
  Administered 2012-03-24: 4 mg via INTRAVENOUS

## 2012-03-24 MED ORDER — PROPOFOL 10 MG/ML IV BOLUS
INTRAVENOUS | Status: DC | PRN
  Administered 2012-03-24: 170 mg via INTRAVENOUS

## 2012-03-24 MED ORDER — LIDOCAINE HCL 4 % MT SOLN
OROMUCOSAL | Status: DC | PRN
  Administered 2012-03-24: 4 mL via TOPICAL

## 2012-03-24 MED ORDER — SUCCINYLCHOLINE CHLORIDE 20 MG/ML IJ SOLN
INTRAMUSCULAR | Status: DC | PRN
  Administered 2012-03-24: 80 mg via INTRAVENOUS

## 2012-03-24 NOTE — Preoperative (Signed)
Beta Blockers   Reason not to administer Beta Blockers:Not Applicable, Not on a beta blocker

## 2012-03-24 NOTE — H&P (Signed)
     [facility]          INTERVAL PROCEDURE H&P   History and Physical Interval Note:  03/24/2012 2:24 PM  Destiny Shelton has presented today for their planned procedure. The various methods of treatment have been discussed with the patient and family. After consideration of risks, benefits and other options for treatment, the patient has consented to the procedure.  The patients' outpatient history has been reviewed, patient examined, and no change in status from most recent office note within the past 30 days. I have reviewed the patients' chart and labs and will proceed as planned. Questions were answered to the patient's satisfaction.   Chrystie Nose, MD, [facility] Attending Cardiologist [facility]  Deyja Sochacki C 03/24/2012, 2:24 PM

## 2012-03-24 NOTE — CV Procedure (Signed)
[facility]  TRANSESOPHAGEAL ECHOCARDIOGRAM (TEE) NOTE   INDICATIONS: Pulmonary hypertension, dyspnea, ?shunt  PROCEDURE:   Informed consent was obtained prior to the procedure. The risks, benefits and alternatives for the procedure were discussed and the patient comprehended these risks.  Risks include, but are not limited to, cough, sore throat, vomiting, nausea, somnolence, esophageal and stomach trauma or perforation, bleeding, low blood pressure, aspiration, pneumonia, infection, trauma to the teeth and death.    After a procedural time-out, the patient was intubated by the anesthesia staff in the operating room. The transesophageal probe was inserted in the esophagus and stomach without difficulty and multiple views were obtained.  The patient was kept under observation until the patient left the procedure room.  The patient left the procedure room in stable condition.   Agitated microbubble saline contrast was administered.  COMPLICATIONS:    There were no immediate complications.  FINDINGS:  1. LEFT VENTRICLE: The left ventricle is mildly hypertrophied without any thrombus or masses. LVEF is 55%.  2. RIGHT VENTRICLE:  The right ventricle is normal in structure and function without any thrombus or masses.    3. LEFT ATRIUM:  The left atrium is dilated without any thrombus or masses.  4. LEFT ATRIAL APPENDAGE:  The left atrial appendage is free of any thrombus or masses. The appendage is unilobular.  5. ATRIAL SEPTUM:  The atrial septum is mobile.  There were a few, late bubbles seen after 10 seconds in the right atrium consistent with a small intrapulmonary shunt by saline microbubble contrast.   6. RIGHT ATRIUM:  The right atrium is dilated without any thrombus or masses.  7. MITRAL VALVE:  The mitral valve is normal in structure and function with mild regurgitation.  There were no vegetations or stenosis.  8. AORTIC VALVE:  The aortic valve is  normal in structure and function without regurgitation.  There were no vegetations or stenosis  9. TRICUSPID VALVE:  The tricuspid valve is normal in structure and function without regurgitation.  There were no vegetations or stenosis  10.  PULMONIC VALVE:  The tricuspid valve is normal in structure and function with trace regurgitation.  There were no vegetations or stenosis.   11. AORTIC ARCH, ASCENDING AND DESCENDING AORTA:  There was no atherosclerosis of the descending or ascending aorta  IMPRESSION:   1. No evidence for interatrial shunting in either direction.  There may be a small intrapulmonary shunt by saline microbubble contrast. 2. No evidence for intracardiac or LAA thrombus. 3. Preserved LVEF.  RECOMMENDATIONS:    1.  Follow-up with Dr. Herbie Baltimore regarding ongoing dyspnea.  Time Spent Directly with the Patient:  45 minutes   Chrystie Nose, MD, [facility] Attending Cardiologist [facility]  03/24/2012, 9:55 PM

## 2012-03-24 NOTE — Progress Notes (Signed)
*  PRELIMINARY RESULTS* Echocardiogram TEE has been performed.  Destiny Shelton 03/24/2012, 4:35 PM

## 2012-03-24 NOTE — Transfer of Care (Signed)
Immediate Anesthesia Transfer of Care Note  Patient: Destiny Shelton  Procedure(s) Performed: Procedure(s) (LRB) with comments: TRANSESOPHAGEAL ECHOCARDIOGRAM (TEE) (N/A) - TEE with Bubble  Patient Location: PACU  Anesthesia Type: General  Level of Consciousness: awake, alert  and oriented  Airway & Oxygen Therapy: Patient Spontanous Breathing and Patient connected to face mask oxygen  Post-op Assessment: Report given to PACU RN and Post -op Vital signs reviewed and stable  Post vital signs: Reviewed and stable  Complications: No apparent anesthesia complications

## 2012-03-24 NOTE — Anesthesia Preprocedure Evaluation (Addendum)
Anesthesia Evaluation  Patient identified by MRN, date of birth, ID band Patient awake    Reviewed: Allergy & Precautions, H&P , NPO status , Patient's Chart, lab work & pertinent test results  History of Anesthesia Complications Negative for: history of anesthetic complications  Airway Mallampati: II TM Distance: >3 FB Neck ROM: Full    Dental  (+) Poor Dentition,    Pulmonary shortness of breath, at rest, lying and Long-Term Oxygen Therapy, asthma , sleep apnea , COPD COPD inhaler and oxygen dependent,    + decreased breath sounds+ wheezing      Cardiovascular hypertension, Pt. on medications + dysrhythmias Rhythm:Regular Rate:Normal     Neuro/Psych  Headaches, PSYCHIATRIC DISORDERS Anxiety Depression  Neuromuscular disease    GI/Hepatic GERD-  Medicated and Controlled,  Endo/Other  diabetes, Well Controlled, Type 2, Insulin Dependent  Renal/GU Renal disease  negative genitourinary   Musculoskeletal  (+) Fibromyalgia -  Abdominal   Peds  Hematology   Anesthesia Other Findings H/O Pulmonary Hypertension  Reproductive/Obstetrics                           Anesthesia Physical Anesthesia Plan  ASA: IV  Anesthesia Plan: General   Post-op Pain Management:    Induction: Intravenous  Airway Management Planned: Oral ETT  Additional Equipment:   Intra-op Plan:   Post-operative Plan: Extubation in OR  Informed Consent: I have reviewed the patients History and Physical, chart, labs and discussed the procedure including the risks, benefits and alternatives for the proposed anesthesia with the patient or authorized representative who has indicated his/her understanding and acceptance.     Plan Discussed with: CRNA, Anesthesiologist and Surgeon  Anesthesia Plan Comments:         Anesthesia Quick Evaluation

## 2012-03-24 NOTE — Anesthesia Postprocedure Evaluation (Signed)
  Anesthesia Post-op Note  Patient: Destiny Shelton  Procedure(s) Performed: Procedure(s) (LRB) with comments: TRANSESOPHAGEAL ECHOCARDIOGRAM (TEE) (N/A) - TEE with Bubble  Patient Location: PACU  Anesthesia Type: General  Level of Consciousness: awake, alert , oriented and patient cooperative  Airway and Oxygen Therapy: Patient Spontanous Breathing  Post-op Pain: none  Post-op Assessment: Post-op Vital signs reviewed, Patient's Cardiovascular Status Stable, Respiratory Function Stable, Patent Airway, No signs of Nausea or vomiting and Pain level controlled  Post-op Vital Signs: stable  Complications: No apparent anesthesia complications

## 2012-03-25 ENCOUNTER — Encounter (HOSPITAL_COMMUNITY): Payer: Medicare Other

## 2012-03-29 ENCOUNTER — Encounter (HOSPITAL_COMMUNITY): Payer: Self-pay | Admitting: Internal Medicine

## 2012-03-30 ENCOUNTER — Encounter (HOSPITAL_COMMUNITY)
Admission: RE | Admit: 2012-03-30 | Discharge: 2012-03-30 | Disposition: A | Discharge status: Home / Self Care | Payer: Medicare Other | Source: Ambulatory Visit | Attending: Pulmonary Disease | Admitting: Pulmonary Disease

## 2012-03-30 NOTE — Progress Notes (Signed)
Destiny Shelton continues to have increased dyspnea with walking only short distance.  Very compliant with PLB.  She is discouraged at this time because she is not improving and does not know why.  In Basket note to Dr. Shelle Iron.  We will continue to encourage and support.

## 2012-04-01 ENCOUNTER — Encounter (HOSPITAL_COMMUNITY)
Admission: RE | Admit: 2012-04-01 | Discharge: 2012-04-01 | Disposition: A | Discharge status: Home / Self Care | Payer: Medicare Other | Source: Ambulatory Visit | Attending: Pulmonary Disease | Admitting: Pulmonary Disease

## 2012-04-01 NOTE — Progress Notes (Signed)
Destiny Shelton today is having more difficulty with dyspnea.  She took frequent rest breaks, lower work loads.  She walked 40 feet form one piece of equipment to another and saturations fell from 94% 6Liter nasal oximizer to 84%.  Heart rate to 135.  She is advised to call Dr. Teddy Spike office for follow up appointment since she seems to be declining.

## 2012-04-02 ENCOUNTER — Encounter: Payer: Self-pay | Admitting: Adult Health

## 2012-04-02 ENCOUNTER — Ambulatory Visit (INDEPENDENT_AMBULATORY_CARE_PROVIDER_SITE_OTHER): Payer: Medicare Other | Admitting: Adult Health

## 2012-04-02 VITALS — BP 112/64 | HR 112 | Temp 97.9°F | Ht 65.0 in | Wt 238.0 lb

## 2012-04-02 DIAGNOSIS — J449 Chronic obstructive pulmonary disease, unspecified: Secondary | ICD-10-CM | POA: Diagnosis not present

## 2012-04-02 MED ORDER — AMOXICILLIN-POT CLAVULANATE 875-125 MG PO TABS: 1.0000 | ORAL_TABLET | Freq: Two times a day (BID) | ORAL | Status: AC

## 2012-04-02 NOTE — Patient Instructions (Addendum)
Augmentin 875mg  Twice daily  For 7 days  Mucinex DM Twice daily  As needed  Cough/congesiton  Fluids and rest  Follow up Dr. Shelle Iron in 2- 3 weeks and As needed   Please contact office for sooner follow up if symptoms do not improve or worsen or seek emergency care

## 2012-04-02 NOTE — Assessment & Plan Note (Signed)
Flare   Plan Augmentin 875mg  Twice daily  For 7 days  Mucinex DM Twice daily  As needed  Cough/congesiton  Fluids and rest  Follow up Dr. Shelle Iron in 2- 3 weeks and As needed   Please contact office for sooner follow up if symptoms do not improve or worsen or seek emergency care

## 2012-04-02 NOTE — Progress Notes (Signed)
  Subjective:    Patient ID: Destiny Shelton, female    DOB: 1945-10-07, 66 y.o.   MRN: [id number]  HPI 66 yo female with known hx of  COPD and chronic dyspnea on exertion with hypoxemia.   Previous  right heart catheterization which showed elevated left-sided pressures with pulmonary venous hypertension.     04/02/2012 Acute OV  Complains of increased SOB, wheezing, tightness in chest, prod cough with yellow mucus x10days .  Has been undergoing workup for Chronic dyspnea/hypoxemia . Had bubble study last week w/ cardiology . Still awaiting results.  No hemoptysis , chest pain or increased edema.    Review of Systems Constitutional:   No  weight loss, night sweats,  Fevers, chills,  +fatigue, or  lassitude.  HEENT:   No headaches,  Difficulty swallowing,  Tooth/dental problems, or  Sore throat,                No sneezing, itching, ear ache, nasal congestion, post nasal drip,   CV:  No chest pain,  Orthopnea, PND, swelling in lower extremities, anasarca, dizziness, palpitations, syncope.   GI  No heartburn, indigestion, abdominal pain, nausea, vomiting, diarrhea, change in bowel habits, loss of appetite, bloody stools.   Resp:    No coughing up of blood.   Marland Kitchen  No chest wall deformity  Skin: no rash or lesions.  GU: no dysuria, change in color of urine, no urgency or frequency.  No flank pain, no hematuria   MS:  No joint pain or swelling.  No decreased range of motion.   Marland Kitchen  Psych:  No change in mood or affect. No depression or anxiety.  No memory loss.         Objective:   Physical Exam GEN: A/Ox3; pleasant , NAD   HEENT:  [place]/AT,  EACs-clear, TMs-wnl, NOSE-clear, THROAT-clear, no lesions, no postnasal drip or exudate noted.   NECK:  Supple w/ fair ROM; no JVD; normal carotid impulses w/o bruits; no thyromegaly or nodules palpated; no lymphadenopathy.  RESP  Scattered rhonchi no accessory muscle use, no dullness to percussion  CARD:  RRR, no m/r/g  , tr  peripheral edema,  pulses intact, no cyanosis or clubbing.  GI:   Soft & nt; nml bowel sounds; no organomegaly or masses detected.  Musco: Warm bil, no deformities or joint swelling noted.   Neuro: alert, no focal deficits noted.    Skin: Warm, no lesions or rashes         Assessment & Plan:

## 2012-04-06 ENCOUNTER — Encounter (HOSPITAL_COMMUNITY)
Admission: RE | Admit: 2012-04-06 | Discharge: 2012-04-06 | Disposition: A | Discharge status: Home / Self Care | Payer: Medicare Other | Source: Ambulatory Visit | Attending: Pulmonary Disease | Admitting: Pulmonary Disease

## 2012-04-06 DIAGNOSIS — J984 Other disorders of lung: Secondary | ICD-10-CM | POA: Diagnosis not present

## 2012-04-06 DIAGNOSIS — I10 Essential (primary) hypertension: Secondary | ICD-10-CM | POA: Insufficient documentation

## 2012-04-06 DIAGNOSIS — G4722 Circadian rhythm sleep disorder, advanced sleep phase type: Secondary | ICD-10-CM | POA: Diagnosis not present

## 2012-04-06 DIAGNOSIS — J4489 Other specified chronic obstructive pulmonary disease: Secondary | ICD-10-CM | POA: Insufficient documentation

## 2012-04-06 DIAGNOSIS — E119 Type 2 diabetes mellitus without complications: Secondary | ICD-10-CM | POA: Insufficient documentation

## 2012-04-06 DIAGNOSIS — Z5189 Encounter for other specified aftercare: Secondary | ICD-10-CM | POA: Insufficient documentation

## 2012-04-06 DIAGNOSIS — R0989 Other specified symptoms and signs involving the circulatory and respiratory systems: Secondary | ICD-10-CM | POA: Insufficient documentation

## 2012-04-06 DIAGNOSIS — Z9981 Dependence on supplemental oxygen: Secondary | ICD-10-CM | POA: Diagnosis not present

## 2012-04-06 DIAGNOSIS — R0609 Other forms of dyspnea: Secondary | ICD-10-CM | POA: Diagnosis not present

## 2012-04-06 DIAGNOSIS — J449 Chronic obstructive pulmonary disease, unspecified: Secondary | ICD-10-CM | POA: Diagnosis not present

## 2012-04-07 NOTE — Progress Notes (Signed)
Ov reviewed and agree with plan as outlined.  

## 2012-04-08 ENCOUNTER — Telehealth: Payer: Self-pay | Admitting: Pulmonary Disease

## 2012-04-08 ENCOUNTER — Encounter (HOSPITAL_COMMUNITY): Payer: Medicare Other

## 2012-04-08 NOTE — Telephone Encounter (Signed)
I spoke with pt and is scheduled 04/13/12 at 4:30

## 2012-04-08 NOTE — Telephone Encounter (Signed)
Please have the pt come in and see me next week so we can discuss various tests and where to go from here.

## 2012-04-13 ENCOUNTER — Encounter (HOSPITAL_COMMUNITY)
Admission: RE | Admit: 2012-04-13 | Discharge: 2012-04-13 | Disposition: A | Discharge status: Home / Self Care | Payer: Medicare Other | Source: Ambulatory Visit | Attending: Pulmonary Disease | Admitting: Pulmonary Disease

## 2012-04-13 ENCOUNTER — Ambulatory Visit (INDEPENDENT_AMBULATORY_CARE_PROVIDER_SITE_OTHER): Payer: Medicare Other | Admitting: Pulmonary Disease

## 2012-04-13 VITALS — BP 120/40 | HR 109 | Temp 98.3°F | Ht 65.0 in | Wt 234.8 lb

## 2012-04-13 DIAGNOSIS — J984 Other disorders of lung: Secondary | ICD-10-CM | POA: Diagnosis not present

## 2012-04-13 DIAGNOSIS — J449 Chronic obstructive pulmonary disease, unspecified: Secondary | ICD-10-CM | POA: Diagnosis not present

## 2012-04-13 DIAGNOSIS — E119 Type 2 diabetes mellitus without complications: Secondary | ICD-10-CM | POA: Diagnosis not present

## 2012-04-13 DIAGNOSIS — Z9981 Dependence on supplemental oxygen: Secondary | ICD-10-CM | POA: Diagnosis not present

## 2012-04-13 DIAGNOSIS — R0602 Shortness of breath: Secondary | ICD-10-CM

## 2012-04-13 DIAGNOSIS — R918 Other nonspecific abnormal finding of lung field: Secondary | ICD-10-CM

## 2012-04-13 DIAGNOSIS — I272 Pulmonary hypertension, unspecified: Secondary | ICD-10-CM

## 2012-04-13 DIAGNOSIS — I2789 Other specified pulmonary heart diseases: Secondary | ICD-10-CM | POA: Diagnosis not present

## 2012-04-13 DIAGNOSIS — Z5189 Encounter for other specified aftercare: Secondary | ICD-10-CM | POA: Diagnosis not present

## 2012-04-13 MED ORDER — PREDNISONE 10 MG PO TABS: ORAL_TABLET | ORAL | Status: DC

## 2012-04-13 MED ORDER — LEVOFLOXACIN 750 MG PO TABS: 750.0000 mg | ORAL_TABLET | Freq: Every day | ORAL | Status: DC

## 2012-04-13 NOTE — Progress Notes (Signed)
  Subjective:    Patient ID: Destiny Shelton, female    DOB: [birth year], 66 y.o.   MRN: [id number]  HPI The patient comes in today for followup of her multiple breathing issues.  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  She has maintained on her bronchodilator regimen and her oxygen.  She is participating in pulmonary rehabilitation.  She recently had an episode of acute asthmatic bronchitis, and was treated with antibiotics by our nurse practitioner.  She has significantly improved, but continues to have some congestion and purulent mucus.  She has also had a transesophageal echo since her last visit, and this showed no obvious intracardiac shunting, but a few late bubbles were seen that were felt to be consistent with a small intrapulmonary shunt.  I have since reviewed her scans with the radiologist, who does not feel that she has pulmonary AVMs.  She also has no history to suggest chronic liver disease.   Review of Systems  Constitutional: Negative for fever and unexpected weight change.  HENT: Positive for congestion and rhinorrhea. Negative for ear pain, nosebleeds, sore throat, sneezing, trouble swallowing, dental problem, postnasal drip and sinus pressure.   Eyes: Negative for redness and itching.  Respiratory: Positive for cough, shortness of breath and wheezing. Negative for chest tightness.   Cardiovascular: Positive for leg swelling. Negative for palpitations.  Gastrointestinal: Negative for nausea and vomiting.  Genitourinary: Negative for dysuria.  Musculoskeletal: Positive for joint swelling.  Skin: Negative for rash.  Neurological: Positive for headaches.  Hematological: Bruises/bleeds easily.  Psychiatric/Behavioral: Positive for dysphoric mood. The patient is nervous/anxious.        Objective:   Physical Exam Obese female in no acute distress Nose without purulence or discharge  noted Oropharynx clear Neck without lymphadenopathy or thyromegaly Chest with a few rhonchi, no crackles or wheezing Cardiac exam is regular rate and rhythm Lower extremities with 1+ edema, no cyanosis Alert and oriented, moves all 4 extremities.       Assessment & Plan:

## 2012-04-13 NOTE — Patient Instructions (Addendum)
Will treat you with another 5 days of levaquin 750mg  one a day. Will treat you with a short course of prednisone to get you thru your acute bronchitis Stay on spiriva and symbicort, but stop daliresp Stay in pulmonary rehab. followup with me in 8 weeks to check on things.

## 2012-04-15 ENCOUNTER — Encounter: Payer: Self-pay | Admitting: Pulmonary Disease

## 2012-04-15 ENCOUNTER — Encounter (HOSPITAL_COMMUNITY)
Admission: RE | Admit: 2012-04-15 | Discharge: 2012-04-15 | Disposition: A | Discharge status: Home / Self Care | Payer: Medicare Other | Source: Ambulatory Visit | Attending: Pulmonary Disease | Admitting: Pulmonary Disease

## 2012-04-15 DIAGNOSIS — J984 Other disorders of lung: Secondary | ICD-10-CM | POA: Diagnosis not present

## 2012-04-15 DIAGNOSIS — Z5189 Encounter for other specified aftercare: Secondary | ICD-10-CM | POA: Diagnosis not present

## 2012-04-15 DIAGNOSIS — Z9981 Dependence on supplemental oxygen: Secondary | ICD-10-CM | POA: Diagnosis not present

## 2012-04-15 DIAGNOSIS — J449 Chronic obstructive pulmonary disease, unspecified: Secondary | ICD-10-CM | POA: Diagnosis not present

## 2012-04-15 DIAGNOSIS — E119 Type 2 diabetes mellitus without complications: Secondary | ICD-10-CM | POA: Diagnosis not present

## 2012-04-15 DIAGNOSIS — I272 Pulmonary hypertension, unspecified: Secondary | ICD-10-CM | POA: Insufficient documentation

## 2012-04-15 NOTE — Assessment & Plan Note (Addendum)
The patient has mild to moderate COPD at best, and is on a very good bronchodilator regimen for this.  She recently had an episode of acute asthmatic bronchitis, and although she has improved, she is not back to baseline after treatment with antibiotics.  I will give her another short course of antibiotics with prednisone, and hopefully this will resolve her increased airway symptoms.

## 2012-04-15 NOTE — Assessment & Plan Note (Signed)
Echo 2012: nl LV and EF, impaired relaxation, nothing to suggest pulm htn. 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   Echo 2020/02/2612:  Nl LV, diastolic dysfxn, normal RV, estimated RVSP 46.  TEE 03/2012:  A few late bubbles seen felt c/w small IP shunting.  CT chest 12/2011:  No PE, very mild mosaic perfusion abnl (prob secondary to her known airflow obstruction).  Cardiac MRI 2013:  No infiltrative process.  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   The patient continues to have significant dyspnea on exertion that is not totally explained.  She has mild to moderate COPD at best, but clearly has a very elevated pulmonary capillary wedge pressure.  She has mild pulmonary hypertension that is pulmonary venous in nature, and there is a question of a small intrapulmonary shunt.  Her CT chest shows a few tiny nodules, and I have reviewed this with the radiologist who feels that these are not AVM's.  The patient has no history to suggest liver disease.  All we can do at this point is to continue her bronchodilator regimen, oxygen therapy, and will leave diuresis to her cardiologist.  The patient also needs to continue in pulmonary rehabilitation, and work aggressively on weight loss.

## 2012-04-15 NOTE — Assessment & Plan Note (Signed)
The patient's pulmonary hypertension is mild, and in light of a very elevated wedge pressure and a PVR of only 1.7 Wood units, this appears to be pulmonary venous origin.  She will need aggressive diuresis for her elevated left-sided pressure.

## 2012-04-20 ENCOUNTER — Encounter (HOSPITAL_COMMUNITY)
Admission: RE | Admit: 2012-04-20 | Discharge: 2012-04-20 | Disposition: A | Discharge status: Home / Self Care | Payer: Medicare Other | Source: Ambulatory Visit | Attending: Pulmonary Disease | Admitting: Pulmonary Disease

## 2012-04-20 DIAGNOSIS — Z9981 Dependence on supplemental oxygen: Secondary | ICD-10-CM | POA: Diagnosis not present

## 2012-04-20 DIAGNOSIS — E119 Type 2 diabetes mellitus without complications: Secondary | ICD-10-CM | POA: Diagnosis not present

## 2012-04-20 DIAGNOSIS — J449 Chronic obstructive pulmonary disease, unspecified: Secondary | ICD-10-CM | POA: Diagnosis not present

## 2012-04-20 DIAGNOSIS — Z5189 Encounter for other specified aftercare: Secondary | ICD-10-CM | POA: Diagnosis not present

## 2012-04-20 DIAGNOSIS — J984 Other disorders of lung: Secondary | ICD-10-CM | POA: Diagnosis not present

## 2012-04-21 DIAGNOSIS — R0602 Shortness of breath: Secondary | ICD-10-CM | POA: Diagnosis not present

## 2012-04-21 DIAGNOSIS — R609 Edema, unspecified: Secondary | ICD-10-CM | POA: Diagnosis not present

## 2012-04-21 DIAGNOSIS — R000 Tachycardia, unspecified: Secondary | ICD-10-CM | POA: Diagnosis not present

## 2012-04-21 DIAGNOSIS — I270 Primary pulmonary hypertension: Secondary | ICD-10-CM | POA: Diagnosis not present

## 2012-04-22 ENCOUNTER — Encounter (HOSPITAL_COMMUNITY)
Admission: RE | Admit: 2012-04-22 | Discharge: 2012-04-22 | Disposition: A | Discharge status: Home / Self Care | Payer: Medicare Other | Source: Ambulatory Visit | Attending: Pulmonary Disease | Admitting: Pulmonary Disease

## 2012-04-22 DIAGNOSIS — J449 Chronic obstructive pulmonary disease, unspecified: Secondary | ICD-10-CM | POA: Diagnosis not present

## 2012-04-22 DIAGNOSIS — Z5189 Encounter for other specified aftercare: Secondary | ICD-10-CM | POA: Diagnosis not present

## 2012-04-22 DIAGNOSIS — Z9981 Dependence on supplemental oxygen: Secondary | ICD-10-CM | POA: Diagnosis not present

## 2012-04-22 DIAGNOSIS — J984 Other disorders of lung: Secondary | ICD-10-CM | POA: Diagnosis not present

## 2012-04-22 DIAGNOSIS — E119 Type 2 diabetes mellitus without complications: Secondary | ICD-10-CM | POA: Diagnosis not present

## 2012-04-23 ENCOUNTER — Ambulatory Visit: Payer: Medicare Other | Admitting: Pulmonary Disease

## 2012-04-26 DIAGNOSIS — Z79899 Other long term (current) drug therapy: Secondary | ICD-10-CM | POA: Diagnosis not present

## 2012-04-27 ENCOUNTER — Encounter (HOSPITAL_COMMUNITY): Admission: RE | Admit: 2012-04-27 | Payer: Medicare Other | Source: Ambulatory Visit

## 2012-04-29 ENCOUNTER — Encounter (HOSPITAL_COMMUNITY): Payer: Medicare Other

## 2012-05-03 DIAGNOSIS — S59909A Unspecified injury of unspecified elbow, initial encounter: Secondary | ICD-10-CM | POA: Diagnosis not present

## 2012-05-03 DIAGNOSIS — Z9181 History of falling: Secondary | ICD-10-CM | POA: Diagnosis not present

## 2012-05-03 DIAGNOSIS — M810 Age-related osteoporosis without current pathological fracture: Secondary | ICD-10-CM | POA: Diagnosis not present

## 2012-05-03 DIAGNOSIS — J449 Chronic obstructive pulmonary disease, unspecified: Secondary | ICD-10-CM | POA: Diagnosis not present

## 2012-05-03 DIAGNOSIS — M79609 Pain in unspecified limb: Secondary | ICD-10-CM | POA: Diagnosis not present

## 2012-05-03 DIAGNOSIS — M25529 Pain in unspecified elbow: Secondary | ICD-10-CM | POA: Diagnosis not present

## 2012-05-03 DIAGNOSIS — S59919A Unspecified injury of unspecified forearm, initial encounter: Secondary | ICD-10-CM | POA: Diagnosis not present

## 2012-05-04 ENCOUNTER — Encounter (HOSPITAL_COMMUNITY): Payer: Medicare Other

## 2012-05-06 ENCOUNTER — Encounter (HOSPITAL_COMMUNITY): Payer: Medicare Other

## 2012-05-06 DIAGNOSIS — M810 Age-related osteoporosis without current pathological fracture: Secondary | ICD-10-CM | POA: Diagnosis not present

## 2012-05-06 DIAGNOSIS — R82998 Other abnormal findings in urine: Secondary | ICD-10-CM | POA: Diagnosis not present

## 2012-05-06 DIAGNOSIS — I10 Essential (primary) hypertension: Secondary | ICD-10-CM | POA: Diagnosis not present

## 2012-05-06 DIAGNOSIS — E1149 Type 2 diabetes mellitus with other diabetic neurological complication: Secondary | ICD-10-CM | POA: Diagnosis not present

## 2012-05-11 ENCOUNTER — Encounter (HOSPITAL_COMMUNITY): Payer: Medicare Other

## 2012-05-12 DIAGNOSIS — R000 Tachycardia, unspecified: Secondary | ICD-10-CM | POA: Diagnosis not present

## 2012-05-12 DIAGNOSIS — J449 Chronic obstructive pulmonary disease, unspecified: Secondary | ICD-10-CM | POA: Diagnosis not present

## 2012-05-12 DIAGNOSIS — R0602 Shortness of breath: Secondary | ICD-10-CM | POA: Diagnosis not present

## 2012-05-12 DIAGNOSIS — I10 Essential (primary) hypertension: Secondary | ICD-10-CM | POA: Diagnosis not present

## 2012-05-13 ENCOUNTER — Encounter (HOSPITAL_COMMUNITY)
Admission: RE | Admit: 2012-05-13 | Discharge: 2012-05-13 | Disposition: A | Discharge status: Home / Self Care | Payer: Medicare Other | Source: Ambulatory Visit | Attending: Pulmonary Disease | Admitting: Pulmonary Disease

## 2012-05-13 DIAGNOSIS — E785 Hyperlipidemia, unspecified: Secondary | ICD-10-CM | POA: Diagnosis not present

## 2012-05-13 DIAGNOSIS — E119 Type 2 diabetes mellitus without complications: Secondary | ICD-10-CM | POA: Diagnosis not present

## 2012-05-13 DIAGNOSIS — Z0000 Encounter for general adult medical examination without abnormal findings: Secondary | ICD-10-CM | POA: Diagnosis not present

## 2012-05-13 DIAGNOSIS — R0989 Other specified symptoms and signs involving the circulatory and respiratory systems: Secondary | ICD-10-CM | POA: Diagnosis not present

## 2012-05-13 DIAGNOSIS — J4489 Other specified chronic obstructive pulmonary disease: Secondary | ICD-10-CM | POA: Diagnosis not present

## 2012-05-13 DIAGNOSIS — I10 Essential (primary) hypertension: Secondary | ICD-10-CM | POA: Diagnosis not present

## 2012-05-13 DIAGNOSIS — Z9981 Dependence on supplemental oxygen: Secondary | ICD-10-CM | POA: Diagnosis not present

## 2012-05-13 DIAGNOSIS — J984 Other disorders of lung: Secondary | ICD-10-CM | POA: Diagnosis not present

## 2012-05-13 DIAGNOSIS — Z5189 Encounter for other specified aftercare: Secondary | ICD-10-CM | POA: Diagnosis not present

## 2012-05-13 DIAGNOSIS — J449 Chronic obstructive pulmonary disease, unspecified: Secondary | ICD-10-CM | POA: Diagnosis not present

## 2012-05-13 DIAGNOSIS — D649 Anemia, unspecified: Secondary | ICD-10-CM | POA: Diagnosis not present

## 2012-05-13 DIAGNOSIS — G4722 Circadian rhythm sleep disorder, advanced sleep phase type: Secondary | ICD-10-CM | POA: Diagnosis not present

## 2012-05-13 DIAGNOSIS — E1149 Type 2 diabetes mellitus with other diabetic neurological complication: Secondary | ICD-10-CM | POA: Diagnosis not present

## 2012-05-14 DIAGNOSIS — Z1212 Encounter for screening for malignant neoplasm of rectum: Secondary | ICD-10-CM | POA: Diagnosis not present

## 2012-05-18 ENCOUNTER — Encounter (HOSPITAL_COMMUNITY)
Admission: RE | Admit: 2012-05-18 | Discharge: 2012-05-18 | Disposition: A | Discharge status: Home / Self Care | Payer: Medicare Other | Source: Ambulatory Visit | Attending: Pulmonary Disease | Admitting: Pulmonary Disease

## 2012-05-18 DIAGNOSIS — Z5189 Encounter for other specified aftercare: Secondary | ICD-10-CM | POA: Diagnosis not present

## 2012-05-18 DIAGNOSIS — J449 Chronic obstructive pulmonary disease, unspecified: Secondary | ICD-10-CM | POA: Diagnosis not present

## 2012-05-18 DIAGNOSIS — Z9981 Dependence on supplemental oxygen: Secondary | ICD-10-CM | POA: Diagnosis not present

## 2012-05-18 DIAGNOSIS — E119 Type 2 diabetes mellitus without complications: Secondary | ICD-10-CM | POA: Diagnosis not present

## 2012-05-18 DIAGNOSIS — J984 Other disorders of lung: Secondary | ICD-10-CM | POA: Diagnosis not present

## 2012-05-20 ENCOUNTER — Encounter (HOSPITAL_COMMUNITY)
Admission: RE | Admit: 2012-05-20 | Discharge: 2012-05-20 | Disposition: A | Discharge status: Home / Self Care | Payer: Medicare Other | Source: Ambulatory Visit | Attending: Pulmonary Disease | Admitting: Pulmonary Disease

## 2012-05-20 DIAGNOSIS — Z5189 Encounter for other specified aftercare: Secondary | ICD-10-CM | POA: Diagnosis not present

## 2012-05-20 DIAGNOSIS — J449 Chronic obstructive pulmonary disease, unspecified: Secondary | ICD-10-CM | POA: Diagnosis not present

## 2012-05-20 DIAGNOSIS — Z9981 Dependence on supplemental oxygen: Secondary | ICD-10-CM | POA: Diagnosis not present

## 2012-05-20 DIAGNOSIS — E119 Type 2 diabetes mellitus without complications: Secondary | ICD-10-CM | POA: Diagnosis not present

## 2012-05-20 DIAGNOSIS — J984 Other disorders of lung: Secondary | ICD-10-CM | POA: Diagnosis not present

## 2012-05-21 DIAGNOSIS — Z1231 Encounter for screening mammogram for malignant neoplasm of breast: Secondary | ICD-10-CM | POA: Diagnosis not present

## 2012-05-25 ENCOUNTER — Encounter (HOSPITAL_COMMUNITY)
Admission: RE | Admit: 2012-05-25 | Discharge: 2012-05-25 | Disposition: A | Discharge status: Home / Self Care | Payer: Medicare Other | Source: Ambulatory Visit | Attending: Pulmonary Disease | Admitting: Pulmonary Disease

## 2012-05-26 ENCOUNTER — Emergency Department (HOSPITAL_COMMUNITY): Payer: Medicare Other

## 2012-05-26 ENCOUNTER — Encounter (HOSPITAL_COMMUNITY): Payer: Self-pay | Admitting: *Deleted

## 2012-05-26 ENCOUNTER — Inpatient Hospital Stay (HOSPITAL_COMMUNITY)
Admission: EM | Admit: 2012-05-26 | Discharge: 2012-06-01 | DRG: 871 | Disposition: A | Discharge status: Home Health | Payer: Medicare Other | Attending: Internal Medicine | Admitting: Internal Medicine

## 2012-05-26 DIAGNOSIS — R0602 Shortness of breath: Secondary | ICD-10-CM | POA: Diagnosis not present

## 2012-05-26 DIAGNOSIS — I10 Essential (primary) hypertension: Secondary | ICD-10-CM | POA: Diagnosis present

## 2012-05-26 DIAGNOSIS — Z886 Allergy status to analgesic agent status: Secondary | ICD-10-CM

## 2012-05-26 DIAGNOSIS — K219 Gastro-esophageal reflux disease without esophagitis: Secondary | ICD-10-CM | POA: Diagnosis present

## 2012-05-26 DIAGNOSIS — Z860100 Personal history of colon polyps, unspecified: Secondary | ICD-10-CM

## 2012-05-26 DIAGNOSIS — F411 Generalized anxiety disorder: Secondary | ICD-10-CM | POA: Diagnosis present

## 2012-05-26 DIAGNOSIS — E1149 Type 2 diabetes mellitus with other diabetic neurological complication: Secondary | ICD-10-CM

## 2012-05-26 DIAGNOSIS — E119 Type 2 diabetes mellitus without complications: Secondary | ICD-10-CM | POA: Diagnosis present

## 2012-05-26 DIAGNOSIS — F329 Major depressive disorder, single episode, unspecified: Secondary | ICD-10-CM | POA: Diagnosis present

## 2012-05-26 DIAGNOSIS — E785 Hyperlipidemia, unspecified: Secondary | ICD-10-CM | POA: Diagnosis present

## 2012-05-26 DIAGNOSIS — Z8249 Family history of ischemic heart disease and other diseases of the circulatory system: Secondary | ICD-10-CM

## 2012-05-26 DIAGNOSIS — G9349 Other encephalopathy: Secondary | ICD-10-CM | POA: Diagnosis not present

## 2012-05-26 DIAGNOSIS — E1142 Type 2 diabetes mellitus with diabetic polyneuropathy: Secondary | ICD-10-CM | POA: Diagnosis not present

## 2012-05-26 DIAGNOSIS — M1990 Unspecified osteoarthritis, unspecified site: Secondary | ICD-10-CM | POA: Diagnosis present

## 2012-05-26 DIAGNOSIS — Z7901 Long term (current) use of anticoagulants: Secondary | ICD-10-CM

## 2012-05-26 DIAGNOSIS — J9620 Acute and chronic respiratory failure, unspecified whether with hypoxia or hypercapnia: Secondary | ICD-10-CM

## 2012-05-26 DIAGNOSIS — E860 Dehydration: Secondary | ICD-10-CM | POA: Diagnosis present

## 2012-05-26 DIAGNOSIS — K5730 Diverticulosis of large intestine without perforation or abscess without bleeding: Secondary | ICD-10-CM | POA: Diagnosis present

## 2012-05-26 DIAGNOSIS — IMO0001 Reserved for inherently not codable concepts without codable children: Secondary | ICD-10-CM | POA: Diagnosis present

## 2012-05-26 DIAGNOSIS — G9340 Encephalopathy, unspecified: Secondary | ICD-10-CM | POA: Diagnosis not present

## 2012-05-26 DIAGNOSIS — J4489 Other specified chronic obstructive pulmonary disease: Secondary | ICD-10-CM | POA: Diagnosis present

## 2012-05-26 DIAGNOSIS — I4891 Unspecified atrial fibrillation: Secondary | ICD-10-CM | POA: Diagnosis not present

## 2012-05-26 DIAGNOSIS — G4733 Obstructive sleep apnea (adult) (pediatric): Secondary | ICD-10-CM | POA: Diagnosis present

## 2012-05-26 DIAGNOSIS — R918 Other nonspecific abnormal finding of lung field: Secondary | ICD-10-CM | POA: Diagnosis not present

## 2012-05-26 DIAGNOSIS — I959 Hypotension, unspecified: Secondary | ICD-10-CM

## 2012-05-26 DIAGNOSIS — A419 Sepsis, unspecified organism: Principal | ICD-10-CM

## 2012-05-26 DIAGNOSIS — J449 Chronic obstructive pulmonary disease, unspecified: Secondary | ICD-10-CM | POA: Diagnosis present

## 2012-05-26 DIAGNOSIS — I480 Paroxysmal atrial fibrillation: Secondary | ICD-10-CM | POA: Diagnosis not present

## 2012-05-26 DIAGNOSIS — I2789 Other specified pulmonary heart diseases: Secondary | ICD-10-CM | POA: Diagnosis present

## 2012-05-26 DIAGNOSIS — J432 Centrilobular emphysema: Secondary | ICD-10-CM | POA: Diagnosis present

## 2012-05-26 DIAGNOSIS — J441 Chronic obstructive pulmonary disease with (acute) exacerbation: Secondary | ICD-10-CM

## 2012-05-26 DIAGNOSIS — Z833 Family history of diabetes mellitus: Secondary | ICD-10-CM

## 2012-05-26 DIAGNOSIS — Z87442 Personal history of urinary calculi: Secondary | ICD-10-CM

## 2012-05-26 DIAGNOSIS — Z794 Long term (current) use of insulin: Secondary | ICD-10-CM | POA: Diagnosis not present

## 2012-05-26 DIAGNOSIS — D649 Anemia, unspecified: Secondary | ICD-10-CM | POA: Diagnosis present

## 2012-05-26 DIAGNOSIS — J9600 Acute respiratory failure, unspecified whether with hypoxia or hypercapnia: Secondary | ICD-10-CM | POA: Diagnosis not present

## 2012-05-26 DIAGNOSIS — M810 Age-related osteoporosis without current pathological fracture: Secondary | ICD-10-CM | POA: Diagnosis present

## 2012-05-26 DIAGNOSIS — N390 Urinary tract infection, site not specified: Secondary | ICD-10-CM

## 2012-05-26 DIAGNOSIS — R059 Cough, unspecified: Secondary | ICD-10-CM | POA: Diagnosis not present

## 2012-05-26 DIAGNOSIS — M549 Dorsalgia, unspecified: Secondary | ICD-10-CM | POA: Diagnosis not present

## 2012-05-26 DIAGNOSIS — N2889 Other specified disorders of kidney and ureter: Secondary | ICD-10-CM | POA: Diagnosis not present

## 2012-05-26 DIAGNOSIS — J984 Other disorders of lung: Secondary | ICD-10-CM | POA: Diagnosis not present

## 2012-05-26 DIAGNOSIS — Z87891 Personal history of nicotine dependence: Secondary | ICD-10-CM

## 2012-05-26 DIAGNOSIS — R05 Cough: Secondary | ICD-10-CM | POA: Diagnosis not present

## 2012-05-26 DIAGNOSIS — F3289 Other specified depressive episodes: Secondary | ICD-10-CM | POA: Diagnosis present

## 2012-05-26 DIAGNOSIS — Z823 Family history of stroke: Secondary | ICD-10-CM

## 2012-05-26 DIAGNOSIS — N119 Chronic tubulo-interstitial nephritis, unspecified: Secondary | ICD-10-CM | POA: Diagnosis not present

## 2012-05-26 DIAGNOSIS — R5383 Other fatigue: Secondary | ICD-10-CM | POA: Diagnosis not present

## 2012-05-26 DIAGNOSIS — N10 Acute tubulo-interstitial nephritis: Secondary | ICD-10-CM | POA: Diagnosis present

## 2012-05-26 DIAGNOSIS — N12 Tubulo-interstitial nephritis, not specified as acute or chronic: Secondary | ICD-10-CM

## 2012-05-26 DIAGNOSIS — Z96659 Presence of unspecified artificial knee joint: Secondary | ICD-10-CM

## 2012-05-26 DIAGNOSIS — Z79899 Other long term (current) drug therapy: Secondary | ICD-10-CM

## 2012-05-26 HISTORY — DX: Sepsis, unspecified organism: A41.9

## 2012-05-26 HISTORY — DX: Urinary tract infection, site not specified: N39.0

## 2012-05-26 LAB — CBC WITH DIFFERENTIAL/PLATELET
Basophils Absolute: 0 10*3/uL (ref 0.0–0.1)
Basophils Relative: 0 % (ref 0–1)
Eosinophils Absolute: 0 10*3/uL (ref 0.0–0.7)
HCT: 33.4 % — ABNORMAL LOW (ref 36.0–46.0)
Hemoglobin: 9.8 g/dL — ABNORMAL LOW (ref 12.0–15.0)
Lymphs Abs: 2.8 10*3/uL (ref 0.7–4.0)
MCHC: 29.3 g/dL — ABNORMAL LOW (ref 30.0–36.0)
MCV: 71.4 fL — ABNORMAL LOW (ref 78.0–100.0)
Neutro Abs: 22.8 10*3/uL — ABNORMAL HIGH (ref 1.7–7.7)
RDW: 19.3 % — ABNORMAL HIGH (ref 11.5–15.5)

## 2012-05-26 LAB — HEPATIC FUNCTION PANEL
ALT: 10 U/L (ref 0–35)
AST: 18 U/L (ref 0–37)
Albumin: 3 g/dL — ABNORMAL LOW (ref 3.5–5.2)
Alkaline Phosphatase: 86 U/L (ref 39–117)
Bilirubin, Direct: 0.2 mg/dL (ref 0.0–0.3)
Total Bilirubin: 0.6 mg/dL (ref 0.3–1.2)

## 2012-05-26 LAB — BASIC METABOLIC PANEL
BUN: 14 mg/dL (ref 6–23)
Chloride: 95 mEq/L — ABNORMAL LOW (ref 96–112)
Creatinine, Ser: 1.06 mg/dL (ref 0.50–1.10)
GFR calc Af Amer: 62 mL/min — ABNORMAL LOW (ref >90)
GFR calc non Af Amer: 53 mL/min — ABNORMAL LOW (ref >90)
Glucose, Bld: 121 mg/dL — ABNORMAL HIGH (ref 70–99)

## 2012-05-26 LAB — URINALYSIS, ROUTINE W REFLEX MICROSCOPIC
Bilirubin Urine: NEGATIVE
Glucose, UA: NEGATIVE mg/dL
Specific Gravity, Urine: 1.013 (ref 1.005–1.030)
Urobilinogen, UA: 0.2 mg/dL (ref 0.0–1.0)

## 2012-05-26 LAB — POCT I-STAT 3, ART BLOOD GAS (G3+)
Bicarbonate: 28.3 mEq/L — ABNORMAL HIGH (ref 20.0–24.0)
O2 Saturation: 98 %
TCO2: 30 mmol/L (ref 0–100)
pH, Arterial: 7.382 (ref 7.350–7.450)

## 2012-05-26 LAB — LACTIC ACID, PLASMA: Lactic Acid, Venous: 1.1 mmol/L (ref 0.5–2.2)

## 2012-05-26 LAB — URINE MICROSCOPIC-ADD ON

## 2012-05-26 LAB — PROTIME-INR: INR: 1.21 (ref 0.00–1.49)

## 2012-05-26 LAB — MAGNESIUM: Magnesium: 1.8 mg/dL (ref 1.5–2.5)

## 2012-05-26 MED ORDER — VANCOMYCIN HCL 10 G IV SOLR
1500.0000 mg | INTRAVENOUS | Status: DC
  Administered 2012-05-27: 1500 mg via INTRAVENOUS
  Filled 2012-05-26 (×2): qty 1500

## 2012-05-26 MED ORDER — HYDROCODONE-ACETAMINOPHEN 5-500 MG PO CAPS: 1.0000 | ORAL_CAPSULE | Freq: Three times a day (TID) | ORAL | Status: DC | PRN

## 2012-05-26 MED ORDER — SODIUM CHLORIDE 0.9 % IV SOLN
INTRAVENOUS | Status: DC
  Administered 2012-05-26 – 2012-05-27 (×2): via INTRAVENOUS
  Administered 2012-05-27: 20 mL/h via INTRAVENOUS

## 2012-05-26 MED ORDER — SODIUM CHLORIDE 0.9 % IV BOLUS (SEPSIS)
1000.0000 mL | Freq: Once | INTRAVENOUS | Status: AC
  Administered 2012-05-26: 1000 mL via INTRAVENOUS

## 2012-05-26 MED ORDER — ACETAMINOPHEN 325 MG PO TABS
650.0000 mg | ORAL_TABLET | Freq: Once | ORAL | Status: AC
  Administered 2012-05-26: 650 mg via ORAL
  Filled 2012-05-26: qty 2

## 2012-05-26 MED ORDER — DULOXETINE HCL 60 MG PO CPEP
60.0000 mg | ORAL_CAPSULE | Freq: Every day | ORAL | Status: DC
  Administered 2012-05-27 – 2012-05-31 (×5): 60 mg via ORAL
  Filled 2012-05-26 (×7): qty 1

## 2012-05-26 MED ORDER — NOREPINEPHRINE BITARTRATE 1 MG/ML IJ SOLN
2.0000 ug/min | INTRAVENOUS | Status: DC
  Administered 2012-05-26: 5 ug/min via INTRAVENOUS
  Filled 2012-05-26: qty 4

## 2012-05-26 MED ORDER — IPRATROPIUM BROMIDE 0.02 % IN SOLN
0.5000 mg | RESPIRATORY_TRACT | Status: DC
  Administered 2012-05-26 – 2012-05-28 (×10): 0.5 mg via RESPIRATORY_TRACT
  Filled 2012-05-26 (×10): qty 2.5

## 2012-05-26 MED ORDER — HYDROCODONE-ACETAMINOPHEN 5-325 MG PO TABS: 1.0000 | ORAL_TABLET | Freq: Three times a day (TID) | ORAL | Status: DC | PRN

## 2012-05-26 MED ORDER — DOPAMINE-DEXTROSE 3.2-5 MG/ML-% IV SOLN: 3.0000 ug/kg/min | INTRAVENOUS | Status: DC

## 2012-05-26 MED ORDER — ALBUTEROL SULFATE HFA 108 (90 BASE) MCG/ACT IN AERS
2.0000 | INHALATION_SPRAY | Freq: Four times a day (QID) | RESPIRATORY_TRACT | Status: DC | PRN
  Filled 2012-05-26: qty 6.7

## 2012-05-26 MED ORDER — DOCUSATE SODIUM 100 MG PO CAPS
100.0000 mg | ORAL_CAPSULE | Freq: Two times a day (BID) | ORAL | Status: DC
  Administered 2012-05-26 – 2012-05-27 (×3): 100 mg via ORAL
  Filled 2012-05-26 (×6): qty 1

## 2012-05-26 MED ORDER — SODIUM CHLORIDE 0.9 % IV BOLUS (SEPSIS)
500.0000 mL | Freq: Once | INTRAVENOUS | Status: AC
  Administered 2012-05-26: 500 mL via INTRAVENOUS

## 2012-05-26 MED ORDER — ALBUTEROL SULFATE (5 MG/ML) 0.5% IN NEBU: 2.5000 mg | INHALATION_SOLUTION | RESPIRATORY_TRACT | Status: DC | PRN

## 2012-05-26 MED ORDER — CHLORHEXIDINE GLUCONATE 0.12 % MT SOLN
15.0000 mL | Freq: Two times a day (BID) | OROMUCOSAL | Status: DC
  Administered 2012-05-26 – 2012-05-27 (×3): 15 mL via OROMUCOSAL
  Filled 2012-05-26 (×3): qty 15

## 2012-05-26 MED ORDER — VANCOMYCIN HCL IN DEXTROSE 1-5 GM/200ML-% IV SOLN
1000.0000 mg | Freq: Once | INTRAVENOUS | Status: AC
  Administered 2012-05-26: 1000 mg via INTRAVENOUS
  Filled 2012-05-26: qty 200

## 2012-05-26 MED ORDER — INSULIN ASPART 100 UNIT/ML ~~LOC~~ SOLN
0.0000 [IU] | SUBCUTANEOUS | Status: DC
  Administered 2012-05-26: 6 [IU] via SUBCUTANEOUS
  Administered 2012-05-27 (×2): 5 [IU] via SUBCUTANEOUS
  Administered 2012-05-27 – 2012-05-28 (×7): 2 [IU] via SUBCUTANEOUS
  Administered 2012-05-28: 3 [IU] via SUBCUTANEOUS
  Administered 2012-05-28: 8 [IU] via SUBCUTANEOUS
  Administered 2012-05-28: 3 [IU] via SUBCUTANEOUS
  Administered 2012-05-29: 2 [IU] via SUBCUTANEOUS
  Administered 2012-05-29: 3 [IU] via SUBCUTANEOUS
  Administered 2012-05-29: 2 [IU] via SUBCUTANEOUS
  Administered 2012-05-29: 3 [IU] via SUBCUTANEOUS
  Administered 2012-05-29: 5 [IU] via SUBCUTANEOUS
  Administered 2012-05-30: 2 [IU] via SUBCUTANEOUS
  Administered 2012-05-30 (×2): 3 [IU] via SUBCUTANEOUS
  Administered 2012-05-30: 2 [IU] via SUBCUTANEOUS
  Administered 2012-05-30: 3 [IU] via SUBCUTANEOUS
  Administered 2012-05-31 (×2): 2 [IU] via SUBCUTANEOUS
  Administered 2012-05-31: 3 [IU] via SUBCUTANEOUS
  Administered 2012-05-31: 5 [IU] via SUBCUTANEOUS
  Administered 2012-05-31: 2 [IU] via SUBCUTANEOUS
  Administered 2012-05-31: 5 [IU] via SUBCUTANEOUS
  Administered 2012-06-01: 3 [IU] via SUBCUTANEOUS
  Administered 2012-06-01: 5 [IU] via SUBCUTANEOUS

## 2012-05-26 MED ORDER — INSULIN GLARGINE 100 UNIT/ML ~~LOC~~ SOLN: 54.0000 [IU] | Freq: Every day | SUBCUTANEOUS | Status: DC

## 2012-05-26 MED ORDER — SODIUM CHLORIDE 0.9 % IJ SOLN
3.0000 mL | Freq: Two times a day (BID) | INTRAMUSCULAR | Status: DC
  Administered 2012-05-26: 3 mL via INTRAVENOUS
  Administered 2012-05-27: 6 mL via INTRAVENOUS
  Administered 2012-05-27 – 2012-05-31 (×7): 3 mL via INTRAVENOUS

## 2012-05-26 MED ORDER — BIOTENE DRY MOUTH MT LIQD
15.0000 mL | Freq: Two times a day (BID) | OROMUCOSAL | Status: DC
  Administered 2012-05-27 – 2012-05-31 (×6): 15 mL via OROMUCOSAL

## 2012-05-26 MED ORDER — TIOTROPIUM BROMIDE MONOHYDRATE 18 MCG IN CAPS
18.0000 ug | ORAL_CAPSULE | Freq: Every day | RESPIRATORY_TRACT | Status: DC
  Filled 2012-05-26: qty 5

## 2012-05-26 MED ORDER — BUDESONIDE-FORMOTEROL FUMARATE 160-4.5 MCG/ACT IN AERO
2.0000 | INHALATION_SPRAY | Freq: Two times a day (BID) | RESPIRATORY_TRACT | Status: DC
  Filled 2012-05-26: qty 6

## 2012-05-26 MED ORDER — SODIUM CHLORIDE 0.9 % IV SOLN: INTRAVENOUS | Status: DC

## 2012-05-26 MED ORDER — IPRATROPIUM BROMIDE 0.02 % IN SOLN: 0.5000 mg | RESPIRATORY_TRACT | Status: DC | PRN

## 2012-05-26 MED ORDER — ROFLUMILAST 500 MCG PO TABS
500.0000 ug | ORAL_TABLET | Freq: Every day | ORAL | Status: DC
  Administered 2012-05-27 – 2012-05-31 (×5): 500 ug via ORAL
  Filled 2012-05-26 (×6): qty 1

## 2012-05-26 MED ORDER — BENZONATATE 100 MG PO CAPS
200.0000 mg | ORAL_CAPSULE | Freq: Three times a day (TID) | ORAL | Status: DC | PRN
  Administered 2012-05-30: 200 mg via ORAL
  Filled 2012-05-26: qty 2

## 2012-05-26 MED ORDER — ALBUTEROL SULFATE (5 MG/ML) 0.5% IN NEBU
2.5000 mg | INHALATION_SOLUTION | RESPIRATORY_TRACT | Status: DC
  Administered 2012-05-26 – 2012-05-28 (×10): 2.5 mg via RESPIRATORY_TRACT
  Filled 2012-05-26 (×10): qty 0.5

## 2012-05-26 MED ORDER — SIMVASTATIN 20 MG PO TABS
20.0000 mg | ORAL_TABLET | Freq: Every day | ORAL | Status: DC
  Administered 2012-05-27: 20 mg via ORAL
  Filled 2012-05-26 (×2): qty 1

## 2012-05-26 MED ORDER — ENOXAPARIN SODIUM 40 MG/0.4ML ~~LOC~~ SOLN
40.0000 mg | SUBCUTANEOUS | Status: DC
  Administered 2012-05-26 – 2012-05-27 (×2): 40 mg via SUBCUTANEOUS
  Filled 2012-05-26 (×3): qty 0.4

## 2012-05-26 MED ORDER — SODIUM CHLORIDE 0.9 % IV BOLUS (SEPSIS)
2000.0000 mL | Freq: Once | INTRAVENOUS | Status: AC
  Administered 2012-05-26 (×2): 1000 mL via INTRAVENOUS

## 2012-05-26 MED ORDER — PANTOPRAZOLE SODIUM 40 MG PO TBEC
40.0000 mg | DELAYED_RELEASE_TABLET | Freq: Every day | ORAL | Status: DC
  Administered 2012-05-27 – 2012-05-31 (×5): 40 mg via ORAL
  Filled 2012-05-26 (×3): qty 1

## 2012-05-26 MED ORDER — HYDROCODONE-HOMATROPINE 5-1.5 MG/5ML PO SYRP: 5.0000 mL | ORAL_SOLUTION | Freq: Four times a day (QID) | ORAL | Status: DC | PRN

## 2012-05-26 MED ORDER — GABAPENTIN 300 MG PO CAPS
300.0000 mg | ORAL_CAPSULE | Freq: Three times a day (TID) | ORAL | Status: DC
  Filled 2012-05-26: qty 1

## 2012-05-26 MED ORDER — SODIUM CHLORIDE 0.9 % IV SOLN
INTRAVENOUS | Status: DC
  Administered 2012-05-26: 14:00:00 via INTRAVENOUS

## 2012-05-26 MED ORDER — PIPERACILLIN-TAZOBACTAM 3.375 G IVPB
3.3750 g | Freq: Three times a day (TID) | INTRAVENOUS | Status: DC
  Administered 2012-05-26 – 2012-06-01 (×17): 3.375 g via INTRAVENOUS
  Filled 2012-05-26 (×20): qty 50

## 2012-05-26 MED ORDER — PIPERACILLIN-TAZOBACTAM 3.375 G IVPB
3.3750 g | Freq: Once | INTRAVENOUS | Status: AC
  Administered 2012-05-26: 3.375 g via INTRAVENOUS
  Filled 2012-05-26: qty 50

## 2012-05-26 MED ORDER — ALPRAZOLAM 0.5 MG PO TABS: 0.5000 mg | ORAL_TABLET | Freq: Every evening | ORAL | Status: DC | PRN

## 2012-05-26 MED ORDER — GABAPENTIN 600 MG PO TABS
300.0000 mg | ORAL_TABLET | Freq: Three times a day (TID) | ORAL | Status: DC
  Filled 2012-05-26: qty 0.5

## 2012-05-26 MED ORDER — POTASSIUM CHLORIDE CRYS ER 10 MEQ PO TBCR: 10.0000 meq | EXTENDED_RELEASE_TABLET | Freq: Every day | ORAL | Status: DC

## 2012-05-26 NOTE — Consult Note (Signed)
PULMONARY  / CRITICAL CARE MEDICINE  Name: Destiny Shelton MRN: [id number] DOB: Oct 12, 1945    LOS: 0  REFERRING MD:  [facility]  CHIEF COMPLAINT:  Sepsis  BRIEF PATIENT DESCRIPTION: 66 yo with multiple comorbidities admitted with symptoms of UTI and hypotension.  LINES / TUBES: Foley 12/25 >>>  CULTURES: 12/25  Blood >>> 12/25  Urine >>>  ANTIBIOTICS: Zosyn 12/25 >>> Vancomycin 12/25 >>>  SIGNIFICANT EVENTS:  12/25 Admitted for UTI / hypotension  LEVEL OF CARE:  ICU  PRIMARY SERVICE:  PCCM, back to [facility] when stable  CONSULTANTS:    CODE STATUS:  Full  DIET:  Clear liquid  DVT Px:  Lovenox  GI Px:  Protonix  The patient is encephalopathic and unable to provide history, which was obtained for available medical records.  HISTORY OF PRESENT ILLNESS:  66 yo with multiple comorbidities admitted with symptoms of UTI and hypotension.  PAST MEDICAL HISTORY :  Past Medical History  Diagnosis Date  . ALLERGIC RHINITIS   . Sleep apnea   . Hypertension   . Adenomatous colon polyp   . Gastritis   . External hemorrhoids   . Diverticulosis of colon   . Diabetes mellitus type 2 with neurological manifestations     On Insulin  . Depression   . Osteoporosis   . Morbidly obese   . Nephrolithiasis   . Back pain, chronic   . Osteoarthritis   . Anxiety   . Hyperlipemia   . Kidney stones     hx of   . Atrial tachycardia     Mostly Sinus Tachycardia  . Asthma   . Inappropriate sinus tachycardia   . COPD mixed type     Requiring Home O2 at 4L  . Shortness of breath   . GERD (gastroesophageal reflux disease)   . Headache     headaches  . Neuromuscular disorder     diabetic neuropathy  . Fibromyalgia     "pain in arms and shoulders."   Past Surgical History  Procedure Date  . Appendectomy 1963  . Cholecystectomy 1963  . Abdominal hysterectomy 1978  . Total knee arthroplasty 1997    right  . Upper gastrointestinal endoscopy 04/09/2006   w/Dilation, gastritis  . Colonoscopy w/ biopsies and polypectomy 07/22/2007, 04/28/2007    2009: diverticulosis, 2008: diverticulosis, adenomatous polyps  . Tee without cardioversion 03/24/2012    Procedure: TRANSESOPHAGEAL ECHOCARDIOGRAM (TEE);  Surgeon: Chrystie Nose, MD;  Location: [facility] OR;  Service: Cardiovascular;  Laterality: N/A;  TEE with Bubble   Prior to Admission medications   Medication Sig Start Date End Date Taking? Authorizing Provider  albuterol (VENTOLIN HFA) 108 (90 BASE) MCG/ACT inhaler Inhale 2 puffs into the lungs every 6 (six) hours as needed. For shortness of breath   Yes Historical Provider, MD  alendronate (FOSAMAX) 70 MG tablet Take 70 mg by mouth every 7 (seven) days. Take with a full glass of water on an empty stomach. Takes on Thursday.   Yes Historical Provider, MD  ALPRAZolam Prudy Feeler) 0.5 MG tablet Take 0.5-1 mg by mouth at bedtime as needed. For anxiety   Yes Historical Provider, MD  benzonatate (TESSALON) 200 MG capsule Take 200 mg by mouth 3 (three) times daily as needed. For cough   Yes Historical Provider, MD  budesonide-formoterol (SYMBICORT) 160-4.5 MCG/ACT inhaler Inhale 2 puffs into the lungs 2 (two) times daily.     Yes Historical Provider, MD  CALCIUM-VITAMIN D PO Take 1 tablet by mouth  2 (two) times daily.    Yes Historical Provider, MD  cyclobenzaprine (FLEXERIL) 10 MG tablet Take 10 mg by mouth at bedtime.    Yes Historical Provider, MD  docusate sodium (COLACE) 100 MG capsule Take 100 mg by mouth 2 (two) times daily.     Yes Historical Provider, MD  DULoxetine (CYMBALTA) 60 MG capsule Take 60 mg by mouth daily.     Yes Historical Provider, MD  furosemide (LASIX) 20 MG tablet Take 20 mg by mouth daily.    Yes Historical Provider, MD  gabapentin (NEURONTIN) 600 MG tablet Take 600 mg by mouth 3 (three) times daily as needed. For pain   Yes Historical Provider, MD  hydrocodone-acetaminophen (LORCET-HD) 5-500 MG per capsule Take 1 capsule by mouth every 8  (eight) hours as needed. For pain   Yes Historical Provider, MD  HYDROcodone-homatropine (HYCODAN) 5-1.5 MG/5ML syrup Take 5 mLs by mouth at bedtime as needed. For cough   Yes Historical Provider, MD  insulin aspart (NOVOLOG) 100 UNIT/ML injection Inject 10 Units into the skin 2 (two) times daily.    Yes Historical Provider, MD  insulin glargine (LANTUS) 100 UNIT/ML injection Inject 84 Units into the skin daily before breakfast.    Yes Historical Provider, MD  metFORMIN (GLUCOPHAGE) 500 MG tablet Take 1,000 mg by mouth 2 (two) times daily with a meal. Re-start as previously ordered 48 hours post cath 12/19/11  Yes Marykay Lex, MD  Multiple Vitamin (MULTIVITAMIN) tablet Take 1 tablet by mouth daily.     Yes Historical Provider, MD  Omega-3 Fatty Acids (FISH OIL) 1000 MG CAPS Take 1 capsule by mouth daily.     Yes Historical Provider, MD  omeprazole (PRILOSEC) 20 MG capsule Take 20 mg by mouth daily.     Yes Historical Provider, MD  potassium chloride (KLOR-CON) 10 MEQ CR tablet Take 20-40 mEq by mouth 2 (two) times daily. Takes in AM and in PM   Yes Historical Provider, MD  pravastatin (PRAVACHOL) 40 MG tablet Take 80 mg by mouth at bedtime.    Yes Historical Provider, MD  roflumilast (DALIRESP) 500 MCG TABS tablet Take 500 mcg by mouth daily.     Yes Historical Provider, MD  tiotropium (SPIRIVA) 18 MCG inhalation capsule Place 18 mcg into inhaler and inhale daily.     Yes Historical Provider, MD  verapamil (VERELAN PM) 360 MG 24 hr capsule Take 360 mg by mouth daily.   Yes Historical Provider, MD   Allergies  Allergen Reactions  . Aspirin Shortness Of Breath  . Nsaids Shortness Of Breath   FAMILY HISTORY:  Family History  Problem Relation Age of Onset  . Heart disease Mother   . Heart disease Father   . Heart disease Brother   . Stroke Mother   . Stroke Brother   . Skin cancer Brother   . Colon cancer Neg Hx   . Colon polyps Sister   . Diabetes Sister   . Diabetes  Brother   . Irritable bowel syndrome Daughter    SOCIAL HISTORY:  reports that she quit smoking about 17 years ago. Her smoking use included Cigarettes. She has a 60 pack-year smoking history. She has never used smokeless tobacco. She reports that she does not drink alcohol or use illicit drugs.  REVIEW OF SYSTEMS:  Per husband some confusion, generalized weakness, chills, hesitancy of urination, back pain and dyspnea in excess of baseline.  INTERVAL HISTORY:  VITAL SIGNS: Temp:  Aydan.Hickman F (  37.2 C)-102.8 F (39.3 C)] 99 F (37.2 C) (12/25 1630) Pulse Rate:  [94-139] 94  (12/25 1630) Resp:  [20-32] 22  (12/25 1630) BP: (78-109)/(31-82) 88/36 mmHg (12/25 1630) SpO2:  [88 %-100 %] 97 % (12/25 1630) FiO2 (%):  [100 %] 100 % (12/25 1630) Weight:  [106.505 kg (234 lb 12.8 oz)] 106.505 kg (234 lb 12.8 oz) (12/25 1507)  HEMODYNAMICS:    VENTILATOR SETTINGS: Vent Mode:  [-]  FiO2 (%):  [100 %] 100 %  INTAKE / OUTPUT: Intake/Output      12/25 0701 - 12/26 0700   I.V. (mL/kg) 3500 (32.9)   Total Intake(mL/kg) 3500 (32.9)   Net +3500        PHYSICAL EXAMINATION: General:  Appears acutely ill Neuro:  Somnolent, arouses to stimulation, somewhat confused HEENT:  PERRL Cardiovascular:  RRR, no m/r/g Lungs:  Bilateral diminished air entry, no w/r/r Abdomen:  Soft, nontender, bowel sounds diminished Musculoskeletal:  Moves all extremities Skin:  Intact  LABS:  Lab 05/26/12 1829 05/26/12 1301 05/26/12 1256 05/26/12 1221 05/26/12 1219  HGB -- -- -- -- 9.8*  WBC -- -- -- -- 28.1*  PLT -- -- -- -- 287  NA -- -- -- -- 134*  K -- -- -- -- 3.9  CL -- -- -- -- 95*  CO2 -- -- -- -- 26  GLUCOSE -- -- -- -- 121*  BUN -- -- -- -- 14  CREATININE -- -- -- -- 1.06  CALCIUM -- -- -- -- 8.7  MG 1.8 -- -- -- --  PHOS 4.1 -- -- -- --  AST -- -- 18 -- --  ALT -- -- 10 -- --  ALKPHOS -- -- 86 -- --  BILITOT -- -- 0.6 -- --  PROT -- -- 7.3 -- --  ALBUMIN -- -- 3.0* -- --  APTT -- -- 36 --  --  INR -- -- 1.21 -- --  LATICACIDVEN 1.1 2.13 -- -- --  TROPONINI -- -- -- <0.30 --  PROCALCITON -- -- -- -- --  PROBNP -- -- -- 255.6* --  O2SATVEN -- -- -- -- --  PHART -- 7.382 -- -- --  PCO2ART -- 47.7* -- -- --  PO2ART -- 102.0* -- -- --    Lab 05/26/12 1810  GLUCAP 202*   IMAGING: 12/25  PCXR >>> No overt airspace disease   ECG: 12/25  12 lead ECG >>> Sinus tachycardia, no ST-T changes  DIAGNOSES: Active Problems:  Diabetes mellitus type 2 with neurological manifestations  COPD (chronic obstructive pulmonary disease)  Morbidly obese  Sepsis(995.91)  Pyelonephritis  Encephalopathy acute  ASSESSMENT / PLAN:  PULMONARY  A:  COPD (oxygen dependent) without evidence of exacerbation.  OSA (not on positive pressure therapy).   P:   Gaol SpO2>92 Supplemental oxygen Hold Albuterol, Symbicort, Spiriva inhalers due to encephalopathy Albuterol / Atrovent neb treatments Continue Daliresp  CARDIOVASCULAR  A: Sepsis, likely secondary to pyelonephritis in setting of dehydration, Lasix, Verapamil.  Urine output / lactate reassuring.  Dyslipidemia. P:  Goal MAP>60 A-line Possible CVL Levophed via PIV for now Hold Verapamil Continue Zocor Troponin, Lactate, COOX if CVL placed   RENAL  A:  Dehydration / hypovolemia. P:   Trend BMP NS@150  Hold Lasix Hold K  GASTROINTESTINAL  A:  No active issues. P:   Clear liquids for now  HEMATOLOGIC  A:  Anemia. P:  Trend CBC  INFECTIOUS  A:  Pyelonephritis. P:   Cultures and antibiotics as above PCT  Renal US  ENDOCRINE   A:  DM II.  Hyperglycemia. P:   SSI Hold Lantus as on clear liquid diet Hold Metformin Check Cortisol level  NEUROLOGIC  A:  Encephalopathy (septic, metabolic). P:   Hold Xanax, Neurontin, Hydrocodone Continue Cymbalta  CLINICAL SUMMARY: 66 yo with multiple comorbidities admitted with symptoms of UTI and hypotension.  Responded to fluids.  Lactate / UO reassuring.  A-line,  possibly CVL.  IVF. Vancomycin / Zosyn.  Hold opioids / sedatives.  SSI.  I have personally obtained a history, examined the patient, evaluated laboratory and imaging results, formulated the assessment and plan and placed orders.  CRITICAL CARE:  The patient is critically ill with multiple organ systems failure and requires high complexity decision making for assessment and support, frequent evaluation and titration of therapies, application of advanced monitoring technologies and extensive interpretation of multiple databases. Critical Care Time devoted to patient care services described in this note is 45 minutes.   Lonia Farber, MD  Pulmonary and Critical Care Medicine [facility] Pager: [phone]  05/26/2012, 7:09 PM

## 2012-05-26 NOTE — Plan of Care (Signed)
Problem: Problem: Respiratory Progression Goal: ABLE TO WEAN TO ROOM AIR Outcome: Not Progressing Pt wears home oxygen, currently titrating oxygen down from 100% Goal: ABLE TO AMBULATE ON ROOM AIR Outcome: Not Progressing Pt on bedrest tonight

## 2012-05-26 NOTE — ED Provider Notes (Signed)
History     CSN: [id number]  Arrival date & time 05/26/12  1159   First MD Initiated Contact with Patient 05/26/12 1202      Chief Complaint  Patient presents with  . Respiratory Distress    (Consider location/radiation/quality/duration/timing/severity/associated sxs/prior treatment) HPI Pt reports 2 days of increasing cough, SOB and subjective fever at home. Symptoms were particularly bad this morning despite using her home nebulizer. She has history of COPD, pulmonary hypertension on home O2. EMS was called and they report SpO2 of 77% on their arrival, although the patient had her oxygen off. She was given albuterol x 2 and atrovent x 1 enroute with Solumedrol IV with some improvement. She denies any pain.   Past Medical History  Diagnosis Date  . ALLERGIC RHINITIS   . Sleep apnea   . Hypertension   . Adenomatous colon polyp   . Gastritis   . External hemorrhoids   . Diverticulosis of colon   . Diabetes mellitus type 2 with neurological manifestations     On Insulin  . Depression   . Osteoporosis   . Morbidly obese   . Nephrolithiasis   . Back pain, chronic   . Osteoarthritis   . Anxiety   . Hyperlipemia   . Kidney stones     hx of   . Atrial tachycardia     Mostly Sinus Tachycardia  . Asthma   . Inappropriate sinus tachycardia   . COPD mixed type     Requiring Home O2 at 4L  . Shortness of breath   . GERD (gastroesophageal reflux disease)   . Headache     headaches  . Neuromuscular disorder     diabetic neuropathy  . Fibromyalgia     "pain in arms and shoulders."    Past Surgical History  Procedure Date  . Appendectomy 1963  . Cholecystectomy 1963  . Abdominal hysterectomy 1978  . Total knee arthroplasty 1997    right  . Upper gastrointestinal endoscopy 04/09/2006    w/Dilation, gastritis  . Colonoscopy w/ biopsies and polypectomy 07/22/2007, 04/28/2007    2009: diverticulosis, 2008: diverticulosis, adenomatous polyps  . Tee without cardioversion  03/24/2012    Procedure: TRANSESOPHAGEAL ECHOCARDIOGRAM (TEE);  Surgeon: Chrystie Nose, MD;  Location: [facility] OR;  Service: Cardiovascular;  Laterality: N/A;  TEE with Bubble    Family History  Problem Relation Age of Onset  . Heart disease Mother   . Heart disease Father   . Heart disease Brother   . Stroke Mother   . Stroke Brother   . Skin cancer Brother   . Colon cancer Neg Hx   . Colon polyps Sister   . Diabetes Sister   . Diabetes Brother   . Irritable bowel syndrome Daughter     History  Substance Use Topics  . Smoking status: Former Smoker -- 2.0 packs/day for 30 years    Types: Cigarettes    Quit date: 06/02/1994  . Smokeless tobacco: Never Used  . Alcohol Use: No    OB History    Grav Para Term Preterm Abortions TAB SAB Ect Mult Living                  Review of Systems All other systems reviewed and are negative except as noted in HPI.   Allergies  Aspirin and Nsaids  Home Medications   Current Outpatient Rx  Name  Route  Sig  Dispense  Refill  . ALBUTEROL SULFATE HFA 108 (90 BASE) MCG/ACT  IN AERS   Inhalation   Inhale 2 puffs into the lungs every 6 (six) hours as needed. For shortness of breath         . ALENDRONATE SODIUM 70 MG PO TABS   Oral   Take 70 mg by mouth every 7 (seven) days. Take with a full glass of water on an empty stomach. Takes on Thursday.         . ALPRAZOLAM 0.5 MG PO TABS   Oral   Take 0.5-1 mg by mouth at bedtime as needed. For anxiety         . BENZONATATE 200 MG PO CAPS   Oral   Take 200 mg by mouth 3 (three) times daily as needed. For cough         . BUDESONIDE-FORMOTEROL FUMARATE 160-4.5 MCG/ACT IN AERO   Inhalation   Inhale 2 puffs into the lungs 2 (two) times daily.           Marland Kitchen CALCIUM-VITAMIN D PO   Oral   Take 1 tablet by mouth 2 (two) times daily.          . CYCLOBENZAPRINE HCL 10 MG PO TABS   Oral   Take 10 mg by mouth at bedtime.          Marland Kitchen DIGOXIN 0.125 MG PO TABS   Oral   Take 125  mcg by mouth daily.         Marland Kitchen DOCUSATE SODIUM 100 MG PO CAPS   Oral   Take 100 mg by mouth 2 (two) times daily.           . DULOXETINE HCL 60 MG PO CPEP   Oral   Take 60 mg by mouth daily.           . FUROSEMIDE 20 MG PO TABS   Oral   Take 20 mg by mouth daily.          Marland Kitchen GABAPENTIN 600 MG PO TABS   Oral   Take 600 mg by mouth 3 (three) times daily as needed. For pain         . HYDROCODONE-ACETAMINOPHEN 5-500 MG PO CAPS   Oral   Take 1 capsule by mouth every 8 (eight) hours as needed. For pain         . HYDROCODONE-HOMATROPINE 5-1.5 MG/5ML PO SYRP   Oral   Take 5 mLs by mouth at bedtime as needed. For cough         . INSULIN ASPART 100 UNIT/ML [place] SOLN   Subcutaneous   Inject 10 Units into the skin 2 (two) times daily.          . INSULIN GLARGINE 100 UNIT/ML [place] SOLN   Subcutaneous   Inject 84 Units into the skin daily before breakfast.          . METFORMIN HCL 500 MG PO TABS   Oral   Take 1,000 mg by mouth 2 (two) times daily with a meal. Re-start as previously ordered 48 hours post cath         . ONE-DAILY MULTI VITAMINS PO TABS   Oral   Take 1 tablet by mouth daily.           Marland Kitchen FISH OIL 1000 MG PO CAPS   Oral   Take 1 capsule by mouth daily.           Marland Kitchen OMEPRAZOLE 20 MG PO CPDR   Oral   Take 20 mg by mouth  daily.           Marland Kitchen POTASSIUM CHLORIDE 10 MEQ PO TBCR   Oral   Take 20-40 mEq by mouth 2 (two) times daily. Takes in AM and in PM         . PRAVASTATIN SODIUM 40 MG PO TABS   Oral   Take 80 mg by mouth at bedtime.          . ROFLUMILAST 500 MCG PO TABS   Oral   Take 500 mcg by mouth daily.           Marland Kitchen TIOTROPIUM BROMIDE MONOHYDRATE 18 MCG IN CAPS   Inhalation   Place 18 mcg into inhaler and inhale daily.           Marland Kitchen VERAPAMIL HCL ER 360 MG PO CP24   Oral   Take 360 mg by mouth daily.           BP 100/82  Pulse 107  Temp 102.8 F (39.3 C) (Rectal)  Resp 28  SpO2 98%  Physical Exam  Nursing  note and vitals reviewed. Constitutional: She is oriented to person, place, and time. She appears well-developed and well-nourished.  HENT:  Head: Normocephalic and atraumatic.  Eyes: EOM are normal. Pupils are equal, round, and reactive to light.  Neck: Normal range of motion. Neck supple.  Cardiovascular: Normal heart sounds and intact distal pulses.        tachycardia  Pulmonary/Chest: She is in respiratory distress. She has wheezes.       Decreased air movement  Abdominal: Bowel sounds are normal. She exhibits no distension. There is no tenderness.  Musculoskeletal: Normal range of motion. She exhibits no edema and no tenderness.  Neurological: She is alert and oriented to person, place, and time. She has normal strength. No cranial nerve deficit or sensory deficit.  Skin: Skin is warm and dry. No rash noted.  Psychiatric: She has a normal mood and affect.    ED Course  Procedures (including critical care time)  Labs Reviewed  CBC WITH DIFFERENTIAL - Abnormal; Notable for the following:    WBC 28.1 (*)     Hemoglobin 9.8 (*)     HCT 33.4 (*)     MCV 71.4 (*)     MCH 20.9 (*)     MCHC 29.3 (*)     RDW 19.3 (*)     Neutrophils Relative 81 (*)     Lymphocytes Relative 10 (*)     Neutro Abs 22.8 (*)     Monocytes Absolute 2.5 (*)     All other components within normal limits  BASIC METABOLIC PANEL - Abnormal; Notable for the following:    Sodium 134 (*)     Chloride 95 (*)     Glucose, Bld 121 (*)     GFR calc non Af Amer 53 (*)     GFR calc Af Amer 62 (*)     All other components within normal limits  PRO B NATRIURETIC PEPTIDE - Abnormal; Notable for the following:    Pro B Natriuretic peptide (BNP) 255.6 (*)     All other components within normal limits  POCT I-STAT 3, BLOOD GAS (G3+) - Abnormal; Notable for the following:    pCO2 arterial 47.7 (*)     pO2, Arterial 102.0 (*)     Bicarbonate 28.3 (*)     Acid-Base Excess 3.0 (*)     All other components within  normal limits  TROPONIN I  CG4 I-STAT (LACTIC ACID)  CULTURE, BLOOD (ROUTINE X 2)  CULTURE, BLOOD (ROUTINE X 2)  URINALYSIS, ROUTINE W REFLEX MICROSCOPIC  URINE CULTURE  PROTIME-INR  APTT  HEPATIC FUNCTION PANEL   Dg Chest Port 1 View  05/26/2012  *RADIOLOGY REPORT*  Clinical Data: Cough, fever, shortness of breath  PORTABLE CHEST - 1 VIEW  Comparison: 12/11/2011  Findings: Bibasilar scarring or atelectasis persists.  Heart size is normal.  No pleural effusion.  Cardiac leads overlie the chest. No acute osseous finding.  IMPRESSION: No acute cardiopulmonary process.  Bibasilar persistent scarring or atelectasis.   Original Report Authenticated By: Christiana Pellant, M.D.      No diagnosis found.    MDM   Date: 05/26/2012  Rate: 137  Rhythm: sinus tachycardia  QRS Axis: right  Intervals: normal  ST/T Wave abnormalities: normal  Conduction Disutrbances:none  Narrative Interpretation:   Old EKG Reviewed: faster rate  1:40 PM Initial tachycardia felt to be related to albuterol, but pt also febrile and over the course of the first 30-45 in the ED her BP began to drop. Concern for evolving sepsis. Additional labs ordered including lactate and cultures. IVF bolus ordered as well. Code Sepsis activated, awaiting remaining lab studies, however lactate is normal. CXR neg for PNA. Broad spectrum antibiotics ordered after cultures drawn.   2:38 PM BP and HR improving with IVF. UA shows UTI as her likely source. Discussed with Dr. Waynard Edwards on call for PCP, Dr. Clelia Croft who will come evaluate the patient in the ED for planned admission.   CRITICAL CARE Performed by: Pollyann Savoy   Total critical care time: 60  Critical care time was exclusive of separately billable procedures and treating other patients.  Critical care was necessary to treat or prevent imminent or life-threatening deterioration.  Critical care was time spent personally by me on the following activities: development of  treatment plan with patient and/or surrogate as well as nursing, discussions with consultants, evaluation of patient's response to treatment, examination of patient, obtaining history from patient or surrogate, ordering and performing treatments and interventions, ordering and review of laboratory studies, ordering and review of radiographic studies, pulse oximetry and re-evaluation of patient's condition.         Orville Widmann B. Bernette Mayers, MD 05/26/12 [phone]

## 2012-05-26 NOTE — ED Notes (Signed)
EMS qave 2 nebulizer tx. Total of 10 albuterol and 0.5 Atrovent, 125mg  solumedrol

## 2012-05-26 NOTE — Progress Notes (Signed)
ANTIBIOTIC CONSULT NOTE - INITIAL  Pharmacy Consult for Vancomycin & Zosyn Indication: rule out sepsis  Allergies  Allergen Reactions  . Aspirin Shortness Of Breath  . Nsaids Shortness Of Breath    Patient Measurements: Height: 5\' 5"  (165.1 cm) Weight: 234 lb 12.8 oz (106.505 kg) IBW/kg (Calculated) : 57    Vital Signs: Temp: 100.2 F (37.9 C) (12/25 1507) Temp src: Rectal (12/25 1222) BP: 103/36 mmHg (12/25 1507) Pulse Rate: 104  (12/25 1507) Intake/Output from previous day:   Intake/Output from this shift: Total I/O In: 2500 [I.V.:2500] Out: -   Labs:  Basename 05/26/12 1219  WBC 28.1*  HGB 9.8*  PLT 287  LABCREA --  CREATININE 1.06   The CrCl is unknown because both a height and weight (above a minimum accepted value) are required for this calculation. No results found for this basename: VANCOTROUGH:2,VANCOPEAK:2,VANCORANDOM:2,GENTTROUGH:2,GENTPEAK:2,GENTRANDOM:2,TOBRATROUGH:2,TOBRAPEAK:2,TOBRARND:2,AMIKACINPEAK:2,AMIKACINTROU:2,AMIKACIN:2, in the last 72 hours   Microbiology: No results found for this or any previous visit (from the past 720 hour(s)).  Medical History: Past Medical History  Diagnosis Date  . ALLERGIC RHINITIS   . Sleep apnea   . Hypertension   . Adenomatous colon polyp   . Gastritis   . External hemorrhoids   . Diverticulosis of colon   . Diabetes mellitus type 2 with neurological manifestations     On Insulin  . Depression   . Osteoporosis   . Morbidly obese   . Nephrolithiasis   . Back pain, chronic   . Osteoarthritis   . Anxiety   . Hyperlipemia   . Kidney stones     hx of   . Atrial tachycardia     Mostly Sinus Tachycardia  . Asthma   . Inappropriate sinus tachycardia   . COPD mixed type     Requiring Home O2 at 4L  . Shortness of breath   . GERD (gastroesophageal reflux disease)   . Headache     headaches  . Neuromuscular disorder     diabetic neuropathy  . Fibromyalgia     "pain in arms and shoulders."     Medications:   (Not in a hospital admission)  Admit Complaint: 66 y.o.  female  admitted 05/26/2012 with SOB, confusion, and hypotension with presumed urosepsis.  Pharmacy consulted to dose Vancomycin and Zosyn.  Assessment: Anticoagulation: DVT Px: Enxoaparin 40 mg Q24h  Infectious Disease:  Antibiotics: 12/25 urine, 12/25 Blood x2 12/25 Vanc & Zosyn Cultures:  PTA Medication Issues: Home Meds Not Ordered: Follow up post admission orders.  Best Practices: DVT Prophylaxis:  Enoxaparin  Goal of Therapy:  Vancomycin trough level 15-20 mcg/ml  Plan:  1, Zosyn 3.375g IV q8h infuse over 4h  2. Vancomycin 1500 mg IV q24h 3. Follow up SCr, UOP, cultures, clinical course and adjust as clinically indicated.     Thank you for allowing pharmacy to be a part of this patients care team.  Lovenia Kim Pharm.D., BCPS Clinical Pharmacist 05/26/2012 4:28 PM Pager: [phone] Phone: [phone]

## 2012-05-26 NOTE — ED Notes (Signed)
Admitting MD at bedside. Aware of low BP, fluid bolus now infusing per order. Pt A&OX4.

## 2012-05-26 NOTE — Plan of Care (Signed)
Problem: Phase I Progression Outcomes Goal: OOB as tolerated unless otherwise ordered Outcome: Not Progressing Dyspnea, low BP, bedrest for now Goal: Initial discharge plan identified Outcome: Completed/Met Date Met:  05/26/12 Spoke with family, pt should discharge home, has good family and friend support Goal: Voiding-avoid urinary catheter unless indicated Outcome: Not Applicable Date Met:  05/26/12 Pt has urinary catheter

## 2012-05-26 NOTE — H&P (Signed)
Destiny Shelton is an 66 y.o. female.   Chief Complaint: HPI: Destiny Shelton is a pleasant 65 yo female with many severe medical problems.  She was seen for her physical at our office 2-3 weeks ago.  NO medicine changes were made then.   She has felt poorly for the last 2-3 days.  She had felt weak with some increased sob.  Some mild inc. In cough.  She does report some hesitancy of urination and some mild back pain.   This morning she was acting more confused and had chills and EMS was called.   She denies any blood above or below.   In the ER she is found to be hypotensive with evidence of a uti.   She will therefore be admitted with the diagnosis of urosepsis.  Past Medical History  Diagnosis Date  . ALLERGIC RHINITIS   . Sleep apnea-but family and patient say she doesn't really have this as of 05/26/12   . Hypertension   . Adenomatous colon polyp   . Gastritis   . External hemorrhoids   . Diverticulosis of colon   . Diabetes mellitus type 2 with neurological manifestations     On Insulin  . Depression   . Osteoporosis   . Morbidly obese   . Nephrolithiasis   . Back pain, chronic   . Osteoarthritis   . Anxiety   . Hyperlipemia   . Kidney stones     hx of   . Atrial tachycardia     Mostly Sinus Tachycardia  . Asthma   . Inappropriate sinus tachycardia   . COPD mixed type     Requiring Home O2 at 4L  . Shortness of breath   . GERD (gastroesophageal reflux disease)   . Headache     headaches  . Neuromuscular disorder     diabetic neuropathy  . Fibromyalgia     "pain in arms and shoulders."    Diabetes type 2 since about 2007 Pulmonary htn-rv systolic pressure of 45  Past Surgical History  Procedure Date  . Appendectomy 1963  . Cholecystectomy 1963  . Abdominal hysterectomy 1978  . Total knee arthroplasty 1997    right  . Upper gastrointestinal endoscopy 04/09/2006    w/Dilation, gastritis  . Colonoscopy w/ biopsies and polypectomy 07/22/2007, 04/28/2007    2009:  diverticulosis, 2008: diverticulosis, adenomatous polyps  . Tee without cardioversion 03/24/2012    Procedure: TRANSESOPHAGEAL ECHOCARDIOGRAM (TEE);  Surgeon: Chrystie Nose, MD;  Location: [facility] OR;  Service: Cardiovascular;  Laterality: N/A;  TEE with Bubble    Family History  Problem Relation Age of Onset  . Heart disease Mother   . Heart disease Father   . Heart disease Brother   . Stroke Mother   . Stroke Brother   . Skin cancer Brother   . Colon cancer Neg Hx   . Colon polyps Sister   . Diabetes Sister   . Diabetes Brother   . Irritable bowel syndrome Daughter    Social History:  reports that she quit smoking about 17 years ago. Her smoking use included Cigarettes. She has a 60 pack-year smoking history. She has never used smokeless tobacco. She reports that she does not drink alcohol or use illicit drugs.   Destiny Shelton husband, married 1965.  Allergies:  Allergies  Allergen Reactions  . Aspirin Shortness Of Breath  . Nsaids Shortness Of Breath     (Not in a hospital admission)  Results for orders placed during the hospital encounter  of 05/26/12 (from the past 48 hour(s))  CBC WITH DIFFERENTIAL     Status: Abnormal   Collection Time   05/26/12 12:19 PM      Component Value Range Comment   WBC 28.1 (*) 4.0 - 10.5 K/uL    RBC 4.68  3.87 - 5.11 MIL/uL    Hemoglobin 9.8 (*) 12.0 - 15.0 g/dL    HCT 72.5 (*) 36.6 - 46.0 %    MCV 71.4 (*) 78.0 - 100.0 fL    MCH 20.9 (*) 26.0 - 34.0 pg    MCHC 29.3 (*) 30.0 - 36.0 g/dL    RDW 44.0 (*) 34.7 - 15.5 %    Platelets 287  150 - 400 K/uL    Neutrophils Relative 81 (*) 43 - 77 %    Lymphocytes Relative 10 (*) 12 - 46 %    Monocytes Relative 9  3 - 12 %    Eosinophils Relative 0  0 - 5 %    Basophils Relative 0  0 - 1 %    Neutro Abs 22.8 (*) 1.7 - 7.7 K/uL    Lymphs Abs 2.8  0.7 - 4.0 K/uL    Monocytes Absolute 2.5 (*) 0.1 - 1.0 K/uL    Eosinophils Absolute 0.0  0.0 - 0.7 K/uL    Basophils Absolute 0.0  0.0 - 0.1 K/uL    WBC  Morphology INCREASED BANDS (>20% BANDS)     BASIC METABOLIC PANEL     Status: Abnormal   Collection Time   05/26/12 12:19 PM      Component Value Range Comment   Sodium 134 (*) 135 - 145 mEq/L    Potassium 3.9  3.5 - 5.1 mEq/L    Chloride 95 (*) 96 - 112 mEq/L    CO2 26  19 - 32 mEq/L    Glucose, Bld 121 (*) 70 - 99 mg/dL    BUN 14  6 - 23 mg/dL    Creatinine, Ser 4.25  0.50 - 1.10 mg/dL    Calcium 8.7  8.4 - 95.6 mg/dL    GFR calc non Af Amer 53 (*) >90 mL/min    GFR calc Af Amer 62 (*) >90 mL/min   TROPONIN I     Status: Normal   Collection Time   05/26/12 12:21 PM      Component Value Range Comment   Troponin I <0.30  <0.30 ng/mL   PRO B NATRIURETIC PEPTIDE     Status: Abnormal   Collection Time   05/26/12 12:21 PM      Component Value Range Comment   Pro B Natriuretic peptide (BNP) 255.6 (*) 0 - 125 pg/mL   PROTIME-INR     Status: Normal   Collection Time   05/26/12 12:56 PM      Component Value Range Comment   Prothrombin Time 15.1  11.6 - 15.2 seconds    INR 1.21  0.00 - 1.49   APTT     Status: Normal   Collection Time   05/26/12 12:56 PM      Component Value Range Comment   aPTT 36  24 - 37 seconds   HEPATIC FUNCTION PANEL     Status: Abnormal   Collection Time   05/26/12 12:56 PM      Component Value Range Comment   Total Protein 7.3  6.0 - 8.3 g/dL    Albumin 3.0 (*) 3.5 - 5.2 g/dL    AST 18  0 - 37 U/L  ALT 10  0 - 35 U/L    Alkaline Phosphatase 86  39 - 117 U/L    Total Bilirubin 0.6  0.3 - 1.2 mg/dL    Bilirubin, Direct 0.2  0.0 - 0.3 mg/dL    Indirect Bilirubin 0.4  0.3 - 0.9 mg/dL   CG4 I-STAT (LACTIC ACID)     Status: Normal   Collection Time   05/26/12  1:01 PM      Component Value Range Comment   Lactic Acid, Venous 2.13  0.5 - 2.2 mmol/L   POCT I-STAT 3, BLOOD GAS (G3+)     Status: Abnormal   Collection Time   05/26/12  1:01 PM      Component Value Range Comment   pH, Arterial 7.382  7.350 - 7.450    pCO2 arterial 47.7 (*) 35.0 - 45.0 mmHg     pO2, Arterial 102.0 (*) 80.0 - 100.0 mmHg    Bicarbonate 28.3 (*) 20.0 - 24.0 mEq/L    TCO2 30  0 - 100 mmol/L    O2 Saturation 98.0      Acid-Base Excess 3.0 (*) 0.0 - 2.0 mmol/L    Sample type ARTERIAL     URINALYSIS, ROUTINE W REFLEX MICROSCOPIC     Status: Abnormal   Collection Time   05/26/12  1:08 PM      Component Value Range Comment   Color, Urine YELLOW  YELLOW    APPearance CLOUDY (*) CLEAR    Specific Gravity, Urine 1.013  1.005 - 1.030    pH 6.0  5.0 - 8.0    Glucose, UA NEGATIVE  NEGATIVE mg/dL    Hgb urine dipstick MODERATE (*) NEGATIVE    Bilirubin Urine NEGATIVE  NEGATIVE    Ketones, ur NEGATIVE  NEGATIVE mg/dL    Protein, ur 161 (*) NEGATIVE mg/dL    Urobilinogen, UA 0.2  0.0 - 1.0 mg/dL    Nitrite NEGATIVE  NEGATIVE    Leukocytes, UA LARGE (*) NEGATIVE   URINE MICROSCOPIC-ADD ON     Status: Abnormal   Collection Time   05/26/12  1:08 PM      Component Value Range Comment   WBC, UA TOO NUMEROUS TO COUNT  <3 WBC/hpf    RBC / HPF 0-2  <3 RBC/hpf RESULT CHECKED.   Bacteria, UA MANY (*) RARE    Dg Chest Port 1 View  05/26/2012  *RADIOLOGY REPORT*  Clinical Data: Cough, fever, shortness of breath  PORTABLE CHEST - 1 VIEW  Comparison: 12/11/2011  Findings: Bibasilar scarring or atelectasis persists.  Heart size is normal.  No pleural effusion.  Cardiac leads overlie the chest. No acute osseous finding.  IMPRESSION: No acute cardiopulmonary process.  Bibasilar persistent scarring or atelectasis.   Original Report Authenticated By: Christiana Pellant, M.D.     Meds at home:  Nu iron 150 mg one daily Metoprolol xr 25 mg daily Furosemide 20 mg daily Tessalon 200 mg one po tid prn Verapamil xr 360 mg daily Oxygen 4L per min. Continuous Alendronate 70 mg once a week Flexeril 10 mg po qhs diovan 160 mg 1/2 pill daily symbicort 160-4.5  2 puffs po bid cymbalta 60 mg daily Hydrocodone/apap 5/325 one po tid Potassium chloride 10 meq 3 pills daily lantus 84 units  qam Metformin xr 500 mg 2 pills po bid mvi daily Omeprazole 20 mg po daily Pravastatin 40 mg 2 pills po qhs Colace 100 mg 2 pills daily Ventolin hfa 2 puffs q 4 hrs prn daliresp  500 mcg po daily novolog 10 units sq with each meal Calcium with d bid with food neurontin 600 mg po tid spiriva 18 mcg inhaled daily Alprazolam 0.5 mg 1/2 pill daily Fish oil one pill daily  ROS:as per hpi  Blood pressure 103/36, pulse 104, temperature 100.2 F (37.9 C), temperature source Rectal, resp. rate 23, SpO2 95.00%.  Physical exam:  Ill appearing female. semi-supine on ER stretcher.  She is able to respond to questions appropriately, no pallor or icterus or jvd.  she does have some increased work of breathing noted.   trace bilateral ankle edema.  Heart sounds are distant but she has a regular rhythm.  She has decreased breath sounds bilaterally but no wheezes, rales or rhonchi. Abd is obese, soft, nt, nd, no mass or hsm with just mild suprapubic tenderness noted.  Assessment/Plan  Urosepsis in the setting of significant chronic lung disease and pulmonary htn. She remains seriously ill.  Bp is 97/38 currently.  We will bolus another liter of NS and admit to ICU.  Continue IV zosyn and vancomycin.   Continue most home medicines as tolerated.  She is on 15L o2 in the ER and we will try to titrate that down as tolerated.  Prognosis gaurded.  Full code status.  Ezequiel Kayser, MD 05/26/2012, 3:13 PM

## 2012-05-26 NOTE — Procedures (Signed)
Arterial Catheter Insertion Procedure Note Destiny Shelton [id number] [birth year]  Procedure: Insertion of Arterial Catheter  Indications: Blood pressure monitoring  Procedure Details Consent: Risks of procedure as well as the alternatives and risks of each were explained to the (patient/caregiver).  Consent for procedure obtained. Time Out: Verified patient identification, verified procedure, site/side was marked, verified correct patient position, special equipment/implants available, medications/allergies/relevent history reviewed, required imaging and test results available.  Performed  Maximum sterile technique was used including antiseptics, cap, gloves, gown, hand hygiene, mask and sheet. Skin prep: Chlorhexidine; local anesthetic administered 20 gauge catheter was inserted into right radial artery using the Seldinger technique.  Evaluation Blood flow good; BP tracing good. Complications: No apparent complications.   Destiny Shelton 05/26/2012

## 2012-05-26 NOTE — ED Notes (Signed)
Pt from home, via EMS c/o SOB, difficulty breathing. EMS 02 sat 77% on RA on arrival.

## 2012-05-26 NOTE — ED Notes (Signed)
MD at bedside. 

## 2012-05-27 ENCOUNTER — Inpatient Hospital Stay (HOSPITAL_COMMUNITY): Payer: Medicare Other

## 2012-05-27 ENCOUNTER — Encounter (HOSPITAL_COMMUNITY): Payer: Medicare Other

## 2012-05-27 ENCOUNTER — Encounter (HOSPITAL_COMMUNITY): Payer: Self-pay | Admitting: *Deleted

## 2012-05-27 DIAGNOSIS — N390 Urinary tract infection, site not specified: Secondary | ICD-10-CM

## 2012-05-27 LAB — CBC WITH DIFFERENTIAL/PLATELET
Basophils Absolute: 0 10*3/uL (ref 0.0–0.1)
Eosinophils Absolute: 0 10*3/uL (ref 0.0–0.7)
HCT: 27.4 % — ABNORMAL LOW (ref 36.0–46.0)
Lymphocytes Relative: 4 % — ABNORMAL LOW (ref 12–46)
MCHC: 29.2 g/dL — ABNORMAL LOW (ref 30.0–36.0)
Neutro Abs: 34.7 10*3/uL — ABNORMAL HIGH (ref 1.7–7.7)
Neutrophils Relative %: 91 % — ABNORMAL HIGH (ref 43–77)
Platelets: 233 10*3/uL (ref 150–400)
RDW: 19.6 % — ABNORMAL HIGH (ref 11.5–15.5)
WBC: 38.1 10*3/uL — ABNORMAL HIGH (ref 4.0–10.5)

## 2012-05-27 LAB — GLUCOSE, CAPILLARY
Glucose-Capillary: 147 mg/dL — ABNORMAL HIGH (ref 70–99)
Glucose-Capillary: 150 mg/dL — ABNORMAL HIGH (ref 70–99)
Glucose-Capillary: 142 mg/dL — ABNORMAL HIGH (ref 70–99)
Glucose-Capillary: 205 mg/dL — ABNORMAL HIGH (ref 70–99)

## 2012-05-27 LAB — COMPREHENSIVE METABOLIC PANEL
ALT: 12 U/L (ref 0–35)
Alkaline Phosphatase: 56 U/L (ref 39–117)
BUN: 12 mg/dL (ref 6–23)
CO2: 23 mEq/L (ref 19–32)
Chloride: 107 mEq/L (ref 96–112)
GFR calc Af Amer: >90 mL/min (ref >90)
GFR calc non Af Amer: 85 mL/min — ABNORMAL LOW (ref >90)
Glucose, Bld: 162 mg/dL — ABNORMAL HIGH (ref 70–99)
Potassium: 3.9 mEq/L (ref 3.5–5.1)
Sodium: 140 mEq/L (ref 135–145)
Total Bilirubin: 0.3 mg/dL (ref 0.3–1.2)

## 2012-05-27 LAB — MRSA PCR SCREENING: MRSA by PCR: NEGATIVE

## 2012-05-27 LAB — HEMOGLOBIN A1C
Hgb A1c MFr Bld: 6.4 % — ABNORMAL HIGH (ref <5.7)
Mean Plasma Glucose: 137 mg/dL — ABNORMAL HIGH (ref <117)

## 2012-05-27 MED ORDER — ACETAMINOPHEN 325 MG PO TABS
650.0000 mg | ORAL_TABLET | Freq: Four times a day (QID) | ORAL | Status: DC | PRN
  Administered 2012-05-27 – 2012-05-29 (×5): 650 mg via ORAL
  Filled 2012-05-27 (×7): qty 2

## 2012-05-27 MED ORDER — BOOST / RESOURCE BREEZE PO LIQD
1.0000 | Freq: Two times a day (BID) | ORAL | Status: DC
  Administered 2012-05-27 – 2012-05-31 (×4): 1 via ORAL

## 2012-05-27 MED ORDER — BUDESONIDE-FORMOTEROL FUMARATE 160-4.5 MCG/ACT IN AERO
2.0000 | INHALATION_SPRAY | Freq: Two times a day (BID) | RESPIRATORY_TRACT | Status: DC
  Administered 2012-05-27 – 2012-06-01 (×10): 2 via RESPIRATORY_TRACT
  Filled 2012-05-27: qty 6

## 2012-05-27 NOTE — Progress Notes (Signed)
The patient was seen and examined. Levophed weaned off, oxygen weaned to venturi mask. The patient subjectively feels better. [place] with no evidence of hydronephrosis.  Cont current care.

## 2012-05-27 NOTE — Progress Notes (Signed)
INITIAL NUTRITION ASSESSMENT  DOCUMENTATION CODES Per approved criteria  -Morbid Obesity   INTERVENTION:  Resource Breeze supplement twice daily (250 kcals, 9 gm protein per 8 fl oz carton) RD to follow for nutrition care plan  NUTRITION DIAGNOSIS: Inadequate oral intake related to limited appetite, nausea as evidenced by husband report  Goal: Oral intake with meals & supplements to meet >/= 90% of estimated nutrition needs  Monitor:  PO & supplemental intake, weight, labs, I/O's  Reason for Assessment: Malnutrition Screening Tool Report  66 y.o. female  Admitting Dx: urosepsis & hypotension  ASSESSMENT: Patient admitted after acting more confused and having chills; EMS was called; in the ER she is found to be hypotensive with evidence of UTI; RD spoke with patient's husband at bedside; reports patient was not eating very well PTA; had lost approximately 4 lbs, however, not significant for time frame; amenable to Resource Breeze supplements -- RD to order.  Height: Ht Readings from Last 1 Encounters:  05/26/12 5\' 5"  (1.651 m)    Weight: Wt Readings from Last 1 Encounters:  05/27/12 247 lb (112.038 kg)    Ideal Body Weight: 56.8 kg  % Ideal Body Weight: 197%  Wt Readings from Last 10 Encounters:  05/27/12 247 lb (112.038 kg)  04/13/12 234 lb 12.8 oz (106.505 kg)  04/02/12 238 lb (107.956 kg)  03/23/12 231 lb (104.781 kg)  02/19/12 239 lb 9.6 oz (108.682 kg)  12/19/11 338 lb (153.316 kg)  12/19/11 338 lb (153.316 kg)  12/11/11 238 lb 6.4 oz (108.138 kg)  11/11/11 235 lb 6.4 oz (106.777 kg)  08/19/11 238 lb 3.2 oz (108.047 kg)    Usual Body Weight: 234 lb  % Usual Body Weight: 105%  BMI:  Body mass index is 41.10 kg/(m^2).  Estimated Nutritional Needs: Kcal: 1800-2000 Protein: 90-100 gm Fluid: 1.8-2.0 L  Skin: Intact  Diet Order: Clear Liquid  EDUCATION NEEDS: -No education needs identified at this time   Intake/Output Summary (Last 24 hours) at  05/27/12 1316 Last data filed at 05/27/12 0800  Gross per 24 hour  Intake 9016.03 ml  Output   2100 ml  Net 6916.03 ml    Last BM: 12/24  Labs:   Lab 05/27/12 0410 05/26/12 1829 05/26/12 1219  NA 140 -- 134*  K 3.9 -- 3.9  CL 107 -- 95*  CO2 23 -- 26  BUN 12 -- 14  CREATININE 0.78 -- 1.06  CALCIUM 7.3* -- 8.7  MG -- 1.8 --  PHOS -- 4.1 --  GLUCOSE 162* -- 121*    CBG (last 3)   Basename 05/27/12 1159 05/27/12 0750 05/27/12 0416  GLUCAP 142* 150* 147*    Scheduled Meds:   . ipratropium  0.5 mg Nebulization Q4H   And  . albuterol  2.5 mg Nebulization Q4H  . antiseptic oral rinse  15 mL Mouth Rinse q12n4p  . chlorhexidine  15 mL Mouth Rinse BID  . docusate sodium  100 mg Oral BID  . DULoxetine  60 mg Oral Daily  . enoxaparin (LOVENOX) injection  40 mg Subcutaneous Q24H  . insulin aspart  0-15 Units Subcutaneous Q4H  . pantoprazole  40 mg Oral Daily  . piperacillin-tazobactam (ZOSYN)  IV  3.375 g Intravenous Q8H  . roflumilast  500 mcg Oral Daily  . simvastatin  20 mg Oral q1800  . sodium chloride  3 mL Intravenous Q12H  . vancomycin  1,500 mg Intravenous Q24H    Continuous Infusions:   . sodium chloride  20 mL/hr (05/27/12 0802)  . norepinephrine (LEVOPHED) Adult infusion Stopped (05/27/12 0802)    Past Medical History  Diagnosis Date  . ALLERGIC RHINITIS   . Sleep apnea   . Hypertension   . Adenomatous colon polyp   . Gastritis   . External hemorrhoids   . Diverticulosis of colon   . Diabetes mellitus type 2 with neurological manifestations     On Insulin  . Depression   . Osteoporosis   . Morbidly obese   . Nephrolithiasis   . Back pain, chronic   . Osteoarthritis   . Anxiety   . Hyperlipemia   . Kidney stones     hx of   . Atrial tachycardia     Mostly Sinus Tachycardia  . Asthma   . Inappropriate sinus tachycardia   . COPD mixed type     Requiring Home O2 at 4L  . Shortness of breath   . GERD (gastroesophageal reflux disease)   .  Headache     headaches  . Neuromuscular disorder     diabetic neuropathy  . Fibromyalgia     "pain in arms and shoulders."    Past Surgical History  Procedure Date  . Appendectomy 1963  . Cholecystectomy 1963  . Abdominal hysterectomy 1978  . Total knee arthroplasty 1997    right  . Upper gastrointestinal endoscopy 04/09/2006    w/Dilation, gastritis  . Colonoscopy w/ biopsies and polypectomy 07/22/2007, 04/28/2007    2009: diverticulosis, 2008: diverticulosis, adenomatous polyps  . Tee without cardioversion 03/24/2012    Procedure: TRANSESOPHAGEAL ECHOCARDIOGRAM (TEE);  Surgeon: Chrystie Nose, MD;  Location: [facility] OR;  Service: Cardiovascular;  Laterality: N/A;  TEE with Bubble    Kirkland Hun, RD, LDN Pager #: [phone] After-Hours Pager #: [phone]

## 2012-05-27 NOTE — Progress Notes (Signed)
Subjective: Admitted Hypotensive and Uroseptic.  Levophed appears to have been weaned off. Pulm/CCM saw pt last night. She has Art Line in place and BPs better this am  Has Non-Rebreather mask on  Objective: Vital signs in last 24 hours: Temp:  [97.7 F (36.5 C)-102.8 F (39.3 C)] 97.7 F (36.5 C) (12/26 0752) Pulse Rate:  [87-139] 103  (12/26 0800) Resp:  [18-32] 23  (12/26 0800) BP: (78-109)/(31-82) 106/54 mmHg (12/26 0800) SpO2:  [87 %-100 %] 99 % (12/26 0800) Arterial Line BP: (88-149)/(38-86) 116/50 mmHg (12/26 0800) FiO2 (%):  [90 %-100 %] 100 % (12/25 1945) Weight:  [106.505 kg (234 lb 12.8 oz)-112.038 kg (247 lb)] 112.038 kg (247 lb) (12/26 0500) Weight change:  Last BM Date: 05/25/12  CBG (last 3)   Basename 05/27/12 0416 05/27/12 0030 05/26/12 1810  GLUCAP 147* 207* 202*    Intake/Output from previous day:  Intake/Output Summary (Last 24 hours) at 05/27/12 0819 Last data filed at 05/27/12 0800  Gross per 24 hour  Intake 9016.03 ml  Output   2100 ml  Net 6916.03 ml   12/25 0701 - 12/26 0700 In: 8853.5 [P.O.:400; I.V.:7878.5; IV Piggyback:575] Out: 2025 [Urine:2025]   Physical Exam  General appearance: Alert answering ?s correctly, Oriented to place.  Just tired. Eyes: no scleral icterus Throat: oropharynx moist without erythema Resp: Clear Cardio: Reg GI: soft, non-tender; bowel sounds normal; no masses,  no organomegaly Extremities: no clubbing, cyanosis or edema   Lab Results:  Basename 05/27/12 0410 05/26/12 1829 05/26/12 1219  NA 140 -- 134*  K 3.9 -- 3.9  CL 107 -- 95*  CO2 23 -- 26  GLUCOSE 162* -- 121*  BUN 12 -- 14  CREATININE 0.78 -- 1.06  CALCIUM 7.3* -- 8.7  MG -- 1.8 --  PHOS -- 4.1 --     Basename 05/27/12 0410 05/26/12 1256  AST 18 18  ALT 12 10  ALKPHOS 56 86  BILITOT 0.3 0.6  PROT 5.8* 7.3  ALBUMIN 2.3* 3.0*     Basename 05/27/12 0410 05/26/12 1219  WBC 38.1* 28.1*  NEUTROABS 34.7* 22.8*  HGB 8.0* 9.8*  HCT  27.4* 33.4*  MCV 71.7* 71.4*  PLT 233 287    Lab Results  Component Value Date   INR 1.21 05/26/2012   INR 0.96 12/19/2011   INR 1.0 09/06/2007     Basename 05/26/12 1221  CKTOTAL --  CKMB --  CKMBINDEX --  TROPONINI <0.30     Basename 05/26/12 1829  TSH 0.246*  T4TOTAL --  T3FREE --  THYROIDAB --    No results found for this basename: VITAMINB12:2,FOLATE:2,FERRITIN:2,TIBC:2,IRON:2,RETICCTPCT:2 in the last 72 hours  Micro Results: Recent Results (from the past 240 hour(s))  MRSA PCR SCREENING     Status: Normal   Collection Time   05/26/12  9:47 PM      Component Value Range Status Comment   MRSA by PCR NEGATIVE  NEGATIVE Final      Studies/Results: Dg Chest Port 1 View  05/26/2012  *RADIOLOGY REPORT*  Clinical Data: Cough, fever, shortness of breath  PORTABLE CHEST - 1 VIEW  Comparison: 12/11/2011  Findings: Bibasilar scarring or atelectasis persists.  Heart size is normal.  No pleural effusion.  Cardiac leads overlie the chest. No acute osseous finding.  IMPRESSION: No acute cardiopulmonary process.  Bibasilar persistent scarring or atelectasis.   Original Report Authenticated By: Christiana Pellant, M.D.      Medications: Scheduled:   . ipratropium  0.5 mg  Nebulization Q4H   And  . albuterol  2.5 mg Nebulization Q4H  . antiseptic oral rinse  15 mL Mouth Rinse q12n4p  . chlorhexidine  15 mL Mouth Rinse BID  . docusate sodium  100 mg Oral BID  . DULoxetine  60 mg Oral Daily  . enoxaparin (LOVENOX) injection  40 mg Subcutaneous Q24H  . insulin aspart  0-15 Units Subcutaneous Q4H  . pantoprazole  40 mg Oral Daily  . piperacillin-tazobactam (ZOSYN)  IV  3.375 g Intravenous Q8H  . roflumilast  500 mcg Oral Daily  . simvastatin  20 mg Oral q1800  . sodium chloride  3 mL Intravenous Q12H  . vancomycin  1,500 mg Intravenous Q24H   Continuous:   . sodium chloride 20 mL/hr (05/27/12 0802)  . norepinephrine (LEVOPHED) Adult infusion Stopped (05/27/12 0802)      Assessment/Plan: Active Problems:  Diabetes mellitus type 2 with neurological manifestations  COPD (chronic obstructive pulmonary disease)  Morbidly obese  Sepsis(995.91)  Pyelonephritis  Encephalopathy acute  Urosepsis c Hypotension/Fever and Encephalopathy in the setting of significant chronic lung disease and pulmonary htn. She remains seriously ill in the ICU with Art line and using Non-Rebreather to maintain sats - although she is much better than last night.  Continue IV zosyn and Vancomycin.  Follow up on Cxs. Continue most home medicines as tolerated. We will try to titrate O2 down as tolerated. Prognosis gaurded.  Renal US ordered.  Get KUB x 1.  May need CT Ab if [place] non-conclusive and concern @ Abscess. Leukocytosis - up to 38 K in setting of Sepsis and Leukomoid reaction.  No evidence C Dif. Anemia - Worse post Hydration. I will get Type and screen and low threshold for Transfusion.  Follow CBC's Full code status.  Hypotension - Levophed is off.  S/P IV Fluid Resuscitation.  BP Meds and Diuretics on hold. Encephalopathy Anxiety/Depression - Due to Encephalopathy we will hold Xanax, Neurontin, Hydrocodone .  Continue Cymbalta Morbid Obesity Chronic Back pain and Fibromyalgia COPD - On Home O2.  Per CCM: "Hold Albuterol, Symbicort, Spiriva inhalers due to encephalopathy;  Albuterol / Atrovent neb treatments;  Continue Daliresp." GERD Pulmonary htn-rv systolic pressure of 45 OSA - Not on CPAP as outpatient FEN - just clears today  Appreciate CCM help    DM2 with DM Neuropathy -Lantus on hold continue SSI-  Basename 05/27/12 0416 05/27/12 0030 05/26/12 1810  GLUCAP 147* 207* 202*   ID -  Anti-infectives     Start     Dose/Rate Route Frequency Ordered Stop   05/27/12 1300   vancomycin (VANCOCIN) 1,500 mg in sodium chloride 0.9 % 500 mL IVPB        1,500 mg 250 mL/hr over 120 Minutes Intravenous Every 24 hours 05/26/12 1631     05/26/12 2200   piperacillin-tazobactam (ZOSYN) IVPB 3.375 g       3.375 g 12.5 mL/hr over 240 Minutes Intravenous 3 times per day 05/26/12 1631     05/26/12 1315   vancomycin (VANCOCIN) IVPB 1000 mg/200 mL premix        1,000 mg 200 mL/hr over 60 Minutes Intravenous  Once 05/26/12 1305 05/26/12 1530   05/26/12 1315  piperacillin-tazobactam (ZOSYN) IVPB 3.375 g       3.375 g 12.5 mL/hr over 240 Minutes Intravenous  Once 05/26/12 1305 05/26/12 1727         DVT Prophylaxis - lovenox    LOS: 1 day   Calisha Tindel M 05/27/2012, 8:19  AM    

## 2012-05-27 NOTE — Progress Notes (Signed)
eLink Physician-Brief Progress Note Patient Name: Destiny Shelton DOBY DOB: 1945-10-30 MRN: [id number]  Date of Service  05/27/2012   HPI/Events of Note     eICU Interventions  PRN tylenol for headache   Intervention Category Minor Interventions: Routine modifications to care plan (e.g. PRN medications for pain, fever)  DETERDING,ELIZABETH 05/27/2012, 12:32 AM

## 2012-05-27 NOTE — Progress Notes (Signed)
PULMONARY  / CRITICAL CARE MEDICINE  Name: Destiny Shelton MRN: [id number] DOB: 02-Feb-1946    LOS: 1  REFERRING MD:  [facility]  CHIEF COMPLAINT:  Sepsis  BRIEF PATIENT DESCRIPTION: 66 yo with multiple comorbidities admitted with symptoms of UTI and hypotension.  LINES / TUBES: Foley 12/25 >>>  CULTURES: 12/25  Blood >>> 12/25  Urine >>>  ANTIBIOTICS: Zosyn 12/25 >>> Vancomycin 12/25 >>>  SIGNIFICANT EVENTS:  12/25 Admitted for UTI / hypotension   DVT Px:  Lovenox  GI Px:  Protonix  The patient is encephalopathic and unable to provide history, which was obtained for available medical records.  HISTORY OF PRESENT ILLNESS:  66 yo with multiple comorbidities admitted with symptoms of UTI and hypotension.  This am reports feeling better; Levophed titrated down; still on non-rebreather   VITAL SIGNS: Temp:  [97.7 F (36.5 C)-98.7 F (37.1 C)] 98 F (36.7 C) (12/26 1629) Pulse Rate:  [87-124] 119  (12/26 1700) Resp:  [18-30] 28  (12/26 1700) BP: (78-111)/(37-90) 109/71 mmHg (12/26 1300) SpO2:  [87 %-100 %] 92 % (12/26 1700) Arterial Line BP: (88-157)/(38-86) 151/59 mmHg (12/26 1700) FiO2 (%):  [100 %] 100 % (12/25 1945) Weight:  [112.038 kg (247 lb)] 112.038 kg (247 lb) (12/26 0500)  HEMODYNAMICS:    VENTILATOR SETTINGS: Vent Mode:  [-]  FiO2 (%):  [100 %] 100 %  INTAKE / OUTPUT: Intake/Output      12/25 0701 - 12/26 0700 12/26 0701 - 12/27 0700   P.O. 400 1080   I.V. (mL/kg) 7878.5 (70.3) 150 (1.3)   IV Piggyback 575 575   Total Intake(mL/kg) 8853.5 (79) 1805 (16.1)   Urine (mL/kg/hr) 2025 (0.8) 1650 (1.3)   Total Output 2025 1650   Net +6828.5 +155         PHYSICAL EXAMINATION: General:  Appears acutely ill Neuro:  Alert and appropriate; answering questions  HEENT:  PERRL Cardiovascular:  RRR, no m/r/g Lungs:  Bilateral diminished air entry, no w/r/r Abdomen:  Soft, CVA tendernes, bowel sounds diminished Musculoskeletal:  Moves all  extremities Skin:  Intact  LABS:  Lab 05/27/12 0410 05/26/12 2029 05/26/12 1829 05/26/12 1301 05/26/12 1256 05/26/12 1221 05/26/12 1219  HGB 8.0* -- -- -- -- -- 9.8*  WBC 38.1* -- -- -- -- -- 28.1*  PLT 233 -- -- -- -- -- 287  NA 140 -- -- -- -- -- 134*  K 3.9 -- -- -- -- -- 3.9  CL 107 -- -- -- -- -- 95*  CO2 23 -- -- -- -- -- 26  GLUCOSE 162* -- -- -- -- -- 121*  BUN 12 -- -- -- -- -- 14  CREATININE 0.78 -- -- -- -- -- 1.06  CALCIUM 7.3* -- -- -- -- -- 8.7  MG -- -- 1.8 -- -- -- --  PHOS -- -- 4.1 -- -- -- --  AST 18 -- -- -- 18 -- --  ALT 12 -- -- -- 10 -- --  ALKPHOS 56 -- -- -- 86 -- --  BILITOT 0.3 -- -- -- 0.6 -- --  PROT 5.8* -- -- -- 7.3 -- --  ALBUMIN 2.3* -- -- -- 3.0* -- --  APTT -- -- -- -- 36 -- --  INR -- -- -- -- 1.21 -- --  LATICACIDVEN -- -- 1.1 2.13 -- -- --  TROPONINI -- -- -- -- -- <0.30 --  PROCALCITON 1.42 0.85 -- -- -- -- --  PROBNP -- -- -- -- --  255.6* --  O2SATVEN -- -- -- -- -- -- --  PHART -- -- -- 7.382 -- -- --  PCO2ART -- -- -- 47.7* -- -- --  PO2ART -- -- -- 102.0* -- -- --    Lab 05/27/12 1627 05/27/12 1159 05/27/12 0750 05/27/12 0416 05/27/12 0030  GLUCAP 101* 142* 150* 147* 207*   IMAGING: 12/25  PCXR >>> No overt airspace disease   ECG: 12/25  12 lead ECG >>> Sinus tachycardia, no ST-T changes  DIAGNOSES: Active Problems:  Diabetes mellitus type 2 with neurological manifestations  COPD (chronic obstructive pulmonary disease)  Morbidly obese  Sepsis(995.91)  Pyelonephritis  Encephalopathy acute  ASSESSMENT / PLAN:  PULMONARY  A:  COPD (oxygen dependent) without evidence of exacerbation.  OSA (not on positive pressure therapy).   P:   [place] SpO2>90 Supplemental oxygen; titrate from non re breather to venti Restart Albuterol, Symbicort,  Albuterol / Atrovent neb treatments Continue Daliresp  CARDIOVASCULAR  A: Sepsis, likely secondary to pyelonephritis in setting of dehydration, Lasix, Verapamil.  Urine output /  lactate reassuring.  Dyslipidemia. P:  Goal MAP>60  Possible CVL Levophed titrate off Hold Verapamil, Lasix for 12h post pressor  Continue Zocor Troponin, Lactate, COOX if CVL placed   RENAL  A:  Dehydration / hypovolemia. P:   Trend BMP NS to KVO, monitor urine output Hold Lasix Hold K  GASTROINTESTINAL  A:  No active issues. P:   Clear liquids for now  HEMATOLOGIC  A:  Anemia. P:  Trend CBC  INFECTIOUS  A:  Pyelonephritis. P:   Cultures and antibiotics as above PCT Renal US  ENDOCRINE   A:  DM II.  Hyperglycemia. P:   SSI Hold Lantus as on clear liquid diet Hold Metformin Check Cortisol level  NEUROLOGIC  A:  Encephalopathy (septic, metabolic). P:   Hold Xanax, Neurontin, Hydrocodone Continue Cymbalta  CLINICAL SUMMARY: 66 yo with multiple comorbidities admitted with symptoms of UTI and hypotension.  Responded to fluids.  Lactate / UO reassuring. Vancomycin / Zosyn, adjust antibiotics.  Hold opioids / sedatives.  SSI.  I have personally obtained a history, examined the patient, evaluated laboratory and imaging results, formulated the assessment and plan and placed orders.  CRITICAL CARE:  The patient is critically ill with multiple organ systems failure and requires high complexity decision making for assessment and support, frequent evaluation and titration of therapies, application of advanced monitoring technologies and extensive interpretation of multiple databases. Critical Care Time devoted to patient care services described in this note is 30 minutes.   Vanetta Mulders, MD  Pulmonary and Critical Care Medicine [facility] Pager: [phone]  05/27/2012, 5:59 PM

## 2012-05-27 NOTE — Clinical Documentation Improvement (Signed)
SEPSIS DOCUMENTATION QUERY  THIS DOCUMENT IS NOT A PERMANENT PART OF THE MEDICAL RECORD  TO RESPOND TO THE THIS QUERY, FOLLOW THE INSTRUCTIONS BELOW:  1. If needed, update documentation for the patient's encounter via the notes activity.  2. Access this query again and click edit on the In [employer].  3. After updating, or not, click F2 to complete all highlighted (required) fields concerning your review. Select "additional documentation in the medical record" OR "no additional documentation provided".  4. Click Sign note button.  5. The deficiency will fall out of your In Basket *Please let us know if you are not able to complete this workflow by phone or e-mail (listed below).  Please update your documentation within the medical record to reflect your response to this query.                                                                                    05/27/12  Dear Dr. Willadean Carol Marton Redwood,  In a better effort to capture your patient's severity of illness, reflect appropriate length of stay and utilization of resources, a review of the patient medical record has revealed the following indicators. Based on your clinical judgment, please clarify and document in a progress note and/or discharge summary the clinical condition associated with the following supporting information: In responding to this query please exercise your independent judgment.  The fact that a query is asked, does not imply that any particular answer is desired or expected.   Possible Clinical Conditions?  Severe Sepsis Septic Shock Other Condition  Cannot clinically Determine   Risk Factors: chronic illness, COPD mixed type Requiring Home O2 at 4L; Diabetes mellitus type 2 with  neurological manifestations   Presenting S&S: Hypotensive and Uroseptic; Has Non-Rebreather mask on; Appears acutely ill VS: Temp=102.8 RR= 32 P= 139 BP= 78/33 MAP: 42 Diagnostics: Lab:05/26/2012 13:01 Lactic Acid, Venous:  2.13 Procalcitonin: 05/27/2012 04:10 1.42  Cultures: Blood: NGTD Urine culture: Colony Count >=100,000 COLONIES/ML Culture ESCHERICHIA COLI  Urine:05/26/2012 13:08 Color, Urine: YELLOW APPearance: CLOUDY (A) Specific Gravity, Urine: 1.013 pH: 6.0 Glucose: NEGATIVE Bilirubin Urine: NEGATIVE Ketones, ur: NEGATIVE Protein: 100 (A) Urobilinogen, UA: 0.2 Nitrite: NEGATIVE Leukocytes, UA: LARGE (A) Hgb urine dipstick: MODERATE (A) WBC, UA: TOO NUMEROUS TO COUNT RBC / HPF: 0-2 Bacteria, UA: MANY (A)  Treatment: Vanc, Zosyn, Levophed, IV NSS  Reviewed:  no additional documentation provided  Thank You,  Carlena Sax, BSN, CCM   Clinical Documentation Specialist: Health Information Management [place] [phone] [email or web address]

## 2012-05-27 NOTE — Clinical Documentation Improvement (Signed)
RESPIRATORY FAILURE DOCUMENTATION CLARIFICATION QUERY   THIS DOCUMENT IS NOT A PERMANENT PART OF THE MEDICAL RECORD  TO RESPOND TO THE THIS QUERY, FOLLOW THE INSTRUCTIONS BELOW:  1. If needed, update documentation for the patient's encounter via the notes activity.  2. Access this query again and click edit on the In [employer].  3. After updating, or not, click F2 to complete all highlighted (required) fields concerning your review. Select "additional documentation in the medical record" OR "no additional documentation provided".  4. Click Sign note button.  5. The deficiency will fall out of your In Basket *Please let us know if you are not able to complete this workflow by phone or e-mail (listed below).  Please update your documentation within the medical record to reflect your response to this query.                                                                                    05/27/12  Dear Dr.PCCM Marton Redwood,  In a better effort to capture your patient's severity of illness, reflect appropriate length of stay and utilization of resources, a review of the patient medical record has revealed the following indicators. Based on your clinical judgment, please clarify and document in a progress note and/or discharge summary the clinical condition associated with the following supporting information: In responding to this query please exercise your independent judgment.  The fact that a query is asked, does not imply that any particular answer is desired or expected.  Possible Clinical Conditions?  Acute Respiratory Failure Acute on Chronic Respiratory Failure Other Condition Unable to determine   Supporting Information:                     You may use possible, probable, or suspect with inpatient documentation. possible, probable, suspected diagnoses MUST be documented at the time of discharge  Reviewed:  no additional documentation provided  Thank You,  Amada Kingfisher RN, BSN, CCM  Clinical Documentation Specialist: [phone] [email or web address]  Health Information Management [place]

## 2012-05-28 ENCOUNTER — Inpatient Hospital Stay (HOSPITAL_COMMUNITY): Payer: Medicare Other

## 2012-05-28 LAB — CBC WITH DIFFERENTIAL/PLATELET
Basophils Absolute: 0 10*3/uL (ref 0.0–0.1)
HCT: 27.8 % — ABNORMAL LOW (ref 36.0–46.0)
Hemoglobin: 8.2 g/dL — ABNORMAL LOW (ref 12.0–15.0)
Lymphocytes Relative: 7 % — ABNORMAL LOW (ref 12–46)
Monocytes Absolute: 1.4 10*3/uL — ABNORMAL HIGH (ref 0.1–1.0)
Monocytes Relative: 7 % (ref 3–12)
Neutro Abs: 17.1 10*3/uL — ABNORMAL HIGH (ref 1.7–7.7)
RBC: 3.91 MIL/uL (ref 3.87–5.11)
WBC: 20 10*3/uL — ABNORMAL HIGH (ref 4.0–10.5)

## 2012-05-28 LAB — GLUCOSE, CAPILLARY
Glucose-Capillary: 121 mg/dL — ABNORMAL HIGH (ref 70–99)
Glucose-Capillary: 146 mg/dL — ABNORMAL HIGH (ref 70–99)
Glucose-Capillary: 250 mg/dL — ABNORMAL HIGH (ref 70–99)

## 2012-05-28 LAB — BASIC METABOLIC PANEL
CO2: 26 mEq/L (ref 19–32)
Chloride: 99 mEq/L (ref 96–112)
GFR calc Af Amer: >90 mL/min (ref >90)
Potassium: 3.2 mEq/L — ABNORMAL LOW (ref 3.5–5.1)
Sodium: 139 mEq/L (ref 135–145)

## 2012-05-28 LAB — URINE CULTURE: Colony Count: >=100000

## 2012-05-28 LAB — COMPREHENSIVE METABOLIC PANEL
BUN: 6 mg/dL (ref 6–23)
Calcium: 7.8 mg/dL — ABNORMAL LOW (ref 8.4–10.5)
Creatinine, Ser: 0.61 mg/dL (ref 0.50–1.10)
GFR calc Af Amer: >90 mL/min (ref >90)
Glucose, Bld: 136 mg/dL — ABNORMAL HIGH (ref 70–99)
Sodium: 141 mEq/L (ref 135–145)
Total Protein: 5.9 g/dL — ABNORMAL LOW (ref 6.0–8.3)

## 2012-05-28 LAB — TROPONIN I
Troponin I: <0.3 ng/mL (ref <0.30)
Troponin I: <0.3 ng/mL (ref <0.30)

## 2012-05-28 LAB — MAGNESIUM: Magnesium: 1.8 mg/dL (ref 1.5–2.5)

## 2012-05-28 LAB — PROCALCITONIN: Procalcitonin: 0.87 ng/mL

## 2012-05-28 MED ORDER — FUROSEMIDE 10 MG/ML IJ SOLN
INTRAMUSCULAR | Status: AC
  Filled 2012-05-28: qty 4

## 2012-05-28 MED ORDER — AMIODARONE HCL IN DEXTROSE 360-4.14 MG/200ML-% IV SOLN
30.0000 mg/h | INTRAVENOUS | Status: DC
  Administered 2012-05-28: 30 mg/h via INTRAVENOUS
  Filled 2012-05-28 (×4): qty 200

## 2012-05-28 MED ORDER — ONDANSETRON HCL 4 MG/2ML IJ SOLN
INTRAMUSCULAR | Status: AC
  Administered 2012-05-28: 13:00:00
  Filled 2012-05-28: qty 2

## 2012-05-28 MED ORDER — AMIODARONE HCL IN DEXTROSE 360-4.14 MG/200ML-% IV SOLN
INTRAVENOUS | Status: AC
  Administered 2012-05-28: 60 mg/h
  Filled 2012-05-28: qty 400

## 2012-05-28 MED ORDER — HEPARIN BOLUS VIA INFUSION
4000.0000 [IU] | Freq: Once | INTRAVENOUS | Status: AC
  Administered 2012-05-28: 4000 [IU] via INTRAVENOUS
  Filled 2012-05-28: qty 4000

## 2012-05-28 MED ORDER — ALBUTEROL SULFATE HFA 108 (90 BASE) MCG/ACT IN AERS
2.0000 | INHALATION_SPRAY | RESPIRATORY_TRACT | Status: DC | PRN
  Filled 2012-05-28: qty 6.7

## 2012-05-28 MED ORDER — METOPROLOL TARTRATE 1 MG/ML IV SOLN: 2.5000 mg | Freq: Once | INTRAVENOUS | Status: AC

## 2012-05-28 MED ORDER — HEPARIN (PORCINE) IN NACL 100-0.45 UNIT/ML-% IJ SOLN
1450.0000 [IU]/h | INTRAMUSCULAR | Status: DC
  Administered 2012-05-28: 1100 [IU]/h via INTRAVENOUS
  Administered 2012-05-29: 1450 [IU]/h via INTRAVENOUS
  Filled 2012-05-28 (×3): qty 250

## 2012-05-28 MED ORDER — POTASSIUM CHLORIDE CRYS ER 20 MEQ PO TBCR
40.0000 meq | EXTENDED_RELEASE_TABLET | ORAL | Status: AC
  Administered 2012-05-28 (×2): 40 meq via ORAL
  Filled 2012-05-28 (×2): qty 2

## 2012-05-28 MED ORDER — POTASSIUM CHLORIDE CRYS ER 20 MEQ PO TBCR
40.0000 meq | EXTENDED_RELEASE_TABLET | Freq: Two times a day (BID) | ORAL | Status: AC
  Administered 2012-05-28 – 2012-05-29 (×2): 40 meq via ORAL
  Filled 2012-05-28: qty 2

## 2012-05-28 MED ORDER — ATORVASTATIN CALCIUM 10 MG PO TABS
10.0000 mg | ORAL_TABLET | Freq: Every day | ORAL | Status: DC
  Administered 2012-05-28 – 2012-05-31 (×4): 10 mg via ORAL
  Filled 2012-05-28 (×5): qty 1

## 2012-05-28 MED ORDER — AMIODARONE HCL IN DEXTROSE 360-4.14 MG/200ML-% IV SOLN
60.0000 mg/h | INTRAVENOUS | Status: AC
  Administered 2012-05-28: 60 mg/h via INTRAVENOUS
  Filled 2012-05-28: qty 200

## 2012-05-28 MED ORDER — POTASSIUM CHLORIDE CRYS ER 20 MEQ PO TBCR
40.0000 meq | EXTENDED_RELEASE_TABLET | Freq: Once | ORAL | Status: AC
  Administered 2012-05-28: 40 meq via ORAL
  Filled 2012-05-28: qty 2

## 2012-05-28 MED ORDER — DILTIAZEM HCL 100 MG IV SOLR
5.0000 mg/h | INTRAVENOUS | Status: DC
  Administered 2012-05-28: 15 mg/h via INTRAVENOUS
  Administered 2012-05-28: 5 mg/h via INTRAVENOUS
  Administered 2012-05-29: 15 mg/h via INTRAVENOUS
  Filled 2012-05-28 (×2): qty 100

## 2012-05-28 MED ORDER — VERAPAMIL HCL 120 MG PO TABS
120.0000 mg | ORAL_TABLET | Freq: Two times a day (BID) | ORAL | Status: DC
  Administered 2012-05-28: 120 mg via ORAL
  Filled 2012-05-28 (×2): qty 1

## 2012-05-28 MED ORDER — AMIODARONE LOAD VIA INFUSION
150.0000 mg | Freq: Once | INTRAVENOUS | Status: AC
  Administered 2012-05-28: 150 mg via INTRAVENOUS
  Filled 2012-05-28: qty 83.34

## 2012-05-28 MED ORDER — METOPROLOL TARTRATE 1 MG/ML IV SOLN
INTRAVENOUS | Status: AC
  Administered 2012-05-28: 5 mg
  Filled 2012-05-28: qty 5

## 2012-05-28 MED ORDER — TIOTROPIUM BROMIDE MONOHYDRATE 18 MCG IN CAPS
18.0000 ug | ORAL_CAPSULE | Freq: Every day | RESPIRATORY_TRACT | Status: DC
  Administered 2012-05-29 – 2012-06-01 (×4): 18 ug via RESPIRATORY_TRACT
  Filled 2012-05-28: qty 5

## 2012-05-28 MED ORDER — FUROSEMIDE 10 MG/ML IJ SOLN
40.0000 mg | Freq: Once | INTRAMUSCULAR | Status: AC
  Administered 2012-05-28: 40 mg via INTRAVENOUS

## 2012-05-28 MED ORDER — METOPROLOL TARTRATE 1 MG/ML IV SOLN
INTRAVENOUS | Status: AC
  Filled 2012-05-28: qty 5

## 2012-05-28 MED ORDER — FUROSEMIDE 10 MG/ML IJ SOLN
20.0000 mg | Freq: Once | INTRAMUSCULAR | Status: AC
  Administered 2012-05-28: 20 mg via INTRAVENOUS

## 2012-05-28 MED ORDER — METOPROLOL TARTRATE 1 MG/ML IV SOLN
2.5000 mg | Freq: Once | INTRAVENOUS | Status: AC
  Administered 2012-05-28: 2.5 mg via INTRAVENOUS

## 2012-05-28 NOTE — Progress Notes (Signed)
eLink Physician-Brief Progress Note Patient Name: KASYN STOUFFER DOB: 1945/11/06 MRN: [id number]  Date of Service  05/28/2012   HPI/Events of Note  hypokalemia   eICU Interventions  Potassium replaced   Intervention Category Minor Interventions: Electrolytes abnormality - evaluation and management  [place]ford Schnitker 05/28/2012, 5:18 AM

## 2012-05-28 NOTE — Progress Notes (Signed)
Subjective: Feels "100% better".  Abdominal and back pain improved.  No fever, chills.    Objective: Vital signs in last 24 hours: Temp:  [97.7 F (36.5 C)-98.5 F (36.9 C)] 98.5 F (36.9 C) (12/27 0400) Pulse Rate:  [103-125] 124  (12/27 0700) Resp:  [21-33] 29  (12/27 0700) BP: (99-140)/(42-90) 138/60 mmHg (12/27 0500) SpO2:  [90 %-99 %] 93 % (12/27 0700) Arterial Line BP: (109-157)/(43-71) 148/58 mmHg (12/27 0700) Weight:  [110.5 kg (243 lb 9.7 oz)] 110.5 kg (243 lb 9.7 oz) (12/27 0500) Weight change: 3.995 kg (8 lb 12.9 oz) Last BM Date: 05/27/12  CBG (last 3)   Basename 05/28/12 0414 05/27/12 2308 05/27/12 1940  GLUCAP 121* 134* 205*    Intake/Output from previous day: 12/26 0701 - 12/27 0700 In: 2372.5 [P.O.:1560; I.V.:150; IV Piggyback:662.5] Out: 3735 [Urine:3735] Intake/Output this shift:    General appearance: alert and mild tachypnea (chronic) Eyes: no scleral icterus Throat: oropharynx moist without erythema Resp: minimal bilateral expiratory wheezing Cardio: tachycardic (chronic), regular GI: soft, non-tender; bowel sounds normal; no masses,  no organomegaly Extremities: no clubbing, cyanosis; trace edema   Lab Results:  Basename 05/28/12 0404 05/27/12 0410 05/26/12 1829  NA 141 140 --  K 3.0* 3.9 --  CL 106 107 --  CO2 25 23 --  GLUCOSE 136* 162* --  BUN 6 12 --  CREATININE 0.61 0.78 --  CALCIUM 7.8* 7.3* --  MG -- -- 1.8  PHOS -- -- 4.1    Basename 05/28/12 0404 05/27/12 0410  AST 17 18  ALT 12 12  ALKPHOS 61 56  BILITOT 0.3 0.3  PROT 5.9* 5.8*  ALBUMIN 2.3* 2.3*    Basename 05/28/12 0404 05/27/12 0410  WBC 20.0* 38.1*  NEUTROABS 17.1* 34.7*  HGB 8.2* 8.0*  HCT 27.8* 27.4*  MCV 71.1* 71.7*  PLT 192 233   Lab Results  Component Value Date   INR 1.21 05/26/2012   INR 0.96 12/19/2011   INR 1.0 09/06/2007    Basename 05/26/12 1221  CKTOTAL --  CKMB --  CKMBINDEX --  TROPONINI <0.30    Basename 05/26/12 1829  TSH 0.246*    T4TOTAL --  T3FREE --  THYROIDAB --   Urine Culture- E.coli sensitive to Pip/Tazo and Cipro  Studies/Results: US Renal Port  05/27/2012  *RADIOLOGY REPORT*  Clinical Data: Hypertensive diabetic with fever and back pain. Question abscess.  RENAL/URINARY TRACT ULTRASOUND COMPLETE  Comparison:  None.  Findings:  Right Kidney:  12.3 cm. No hydronephrosis or renal mass.  Left Kidney:  12.3 cm. No hydronephrosis or renal mass.  Bladder:  Decompressed by Foley catheter.  IMPRESSION: Portable renal sonogram without abnormality noted.  Please note that ultrasound may not detect pyelonephritis.   Original Report Authenticated By: Lacy Duverney, M.D.    Dg Chest Port 1 View  05/26/2012  *RADIOLOGY REPORT*  Clinical Data: Cough, fever, shortness of breath  PORTABLE CHEST - 1 VIEW  Comparison: 12/11/2011  Findings: Bibasilar scarring or atelectasis persists.  Heart size is normal.  No pleural effusion.  Cardiac leads overlie the chest. No acute osseous finding.  IMPRESSION: No acute cardiopulmonary process.  Bibasilar persistent scarring or atelectasis.   Original Report Authenticated By: Christiana Pellant, M.D.    Dg Abd Portable 1v  05/27/2012  *RADIOLOGY REPORT*  Clinical Data: Left-sided back pain.  Pyelonephritis.  PORTABLE ABDOMEN - 1 VIEW  Comparison: None.  Findings: Curvilinear subcentimeter calcification roughly overlying the lower pole of the right kidney could represent a  renal calculus.  No definite left renal or ureteral calculi are seen. There is mild degenerative change in the spine.  A rectal temperature probe is present.  There is no bowel obstruction.  Free air cannot be assessed on portable AP supine film.  IMPRESSION: No definite left renal calculi.  CT urogram more sensitive. Subcentimeter calcification on the right could be renal in origin. Correlate clinically.   Original Report Authenticated By: Davonna Belling, M.D.      Medications: Scheduled:   . ipratropium  0.5 mg Nebulization Q4H    And  . albuterol  2.5 mg Nebulization Q4H  . antiseptic oral rinse  15 mL Mouth Rinse q12n4p  . budesonide-formoterol  2 puff Inhalation BID  . chlorhexidine  15 mL Mouth Rinse BID  . docusate sodium  100 mg Oral BID  . DULoxetine  60 mg Oral Daily  . enoxaparin (LOVENOX) injection  40 mg Subcutaneous Q24H  . feeding supplement  1 Container Oral BID BM  . insulin aspart  0-15 Units Subcutaneous Q4H  . pantoprazole  40 mg Oral Daily  . piperacillin-tazobactam (ZOSYN)  IV  3.375 g Intravenous Q8H  . potassium chloride  40 mEq Oral Q4H  . roflumilast  500 mcg Oral Daily  . simvastatin  20 mg Oral q1800  . sodium chloride  3 mL Intravenous Q12H  . vancomycin  1,500 mg Intravenous Q24H   Continuous:   . sodium chloride Stopped (05/27/12 1000)  . norepinephrine (LEVOPHED) Adult infusion Stopped (05/27/12 0802)    Assessment/Plan: Active Problems: 1. Sepsis (995.91), likely secondary to Acute Pyelonephritis- Urine culture reveals E.coli sensitive to Pip/Tazo and Cipro.  Blood cultures pending.  Will continue Zosyn for now and deescalate to Cipro prior to discharge if continued improvement.  Discontinue Vancomycin.  DC A-line. 2. Encephalopathy acute- improved.  Secondary to sepsis. 3. Diabetes mellitus type 2 with neurological manifestations- continue  4. COPD (chronic obstructive pulmonary disease)- continue nebulizers.  On 4L O2 at home.  Continue symbicort, Daliresp, bronchodilators. 5. Tachycardia- chronic issue related to underlying pulmonary disease. 6. Morbidly obese 7. Disposition- discontinue foley and [name].  Will transfer to floor tomorrow if stable.  Anticipate discharge in 2-3 days.  PT/OT evaluation.   LOS: 2 days   Liston Thum,W DOUGLAS 05/28/2012, 7:45 AM

## 2012-05-28 NOTE — Progress Notes (Signed)
Inpatient Diabetes Program Recommendations  AACE/ADA: New Consensus Statement on Inpatient Glycemic Control (2013)  Target Ranges:  Prepandial:   less than 140 mg/dL      Peak postprandial:   less than 180 mg/dL (1-2 hours)      Critically ill patients:  140 - 180 mg/dL   Results for JEWELDEAN, DROHAN (MRN [id number]) as of 05/28/2012 08:52  Ref. Range 05/27/2012 04:16 05/27/2012 07:50 05/27/2012 11:59 05/27/2012 16:27 05/27/2012 19:40 05/27/2012 23:08 05/28/2012 04:14 05/28/2012 08:01  Glucose-Capillary Latest Range: 70-99 mg/dL 409 (H) 811 (H) 914 (H) 101 (H) 205 (H) 134 (H) 121 (H) 146 (H)    Note: If blood glucose begins to increase or once patient starts eating solid food, recommend that Lantus be ordered.  According to the chart patient takes Lantus 84 units QAM, Novolog 10 units BID, and Metformin 1000mg  BID at home for diabetes management.  Currently patient is on Novolog moderate correction scale Q4H as an inpatient.  Would recommend starting on low dose Lantus (20 units daily) and titrate accordingly.  Will continue to follow.  Thanks, Orlando Penner, RN, BSN, CCRN Diabetes Coordinator Inpatient Diabetes Program [phone]

## 2012-05-28 NOTE — Consult Note (Signed)
Reason for Consult: Afib with RVR Referring Physician:   [name] is an 66 y.o. female.  HPI:   The patient is a 66 year old woman with a history of COPD requiring home O2, OSA, HLD, HTN,  and mild pulmonary venous hypertension with an elevated wedge pressure of roughly 22 mmHg and some transpulmonary gradient suggestive of pulmonary arterial hypertension as well who underwent a TEE for evaluation of possible shunting. There was a step up from the right atrium to the right ventricle on the right heart cath but then the right ventricle to the PA there was no change. This was on her catheterization, step down from right atrium to right ventricle. That would actually suggest a, that would not be consistent with a left-to-right shunt, which would be a step up as opposed to a step down. The question of an intracardiac shunt was evaluated with a TEE by Dr. Rennis Golden on bubble study but there was no evidence of early crossing from the right side to the left side. There was mild amount of bubbles noted 5 to 6 beats into the cycle which would not be suggestive of an intracardiac shunt and this confirms what was seen on the cardiac MRI.  Left heart cath  revealed normal coronary arteries.  She presented with urinary tract infection and sepsis.  Today she went into Afib RVR with rate as high as 167.  She was started on IV diltiazem and quickly titrated to 15mg /Hour.  She complains of left flank and abdominal pain, nausea but denies CP, SOB,  Dysuria, vomiting, dizziness, cough, congestion.    She was treated with IV lasix and Bipap for pulmonary edema and was negative 6L since this morning.      Past Medical History  Diagnosis Date  . ALLERGIC RHINITIS   . Sleep apnea   . Hypertension   . Adenomatous colon polyp   . Gastritis   . External hemorrhoids   . Diverticulosis of colon   . Diabetes mellitus type 2 with neurological manifestations     On Insulin  . Depression   . Osteoporosis   .  Morbidly obese   . Nephrolithiasis   . Back pain, chronic   . Osteoarthritis   . Anxiety   . Hyperlipemia   . Kidney stones     hx of   . Atrial tachycardia     Mostly Sinus Tachycardia  . Asthma   . Inappropriate sinus tachycardia   . COPD mixed type     Requiring Home O2 at 4L  . Shortness of breath   . GERD (gastroesophageal reflux disease)   . Headache     headaches  . Neuromuscular disorder     diabetic neuropathy  . Fibromyalgia     "pain in arms and shoulders."    Past Surgical History  Procedure Date  . Appendectomy 1963  . Cholecystectomy 1963  . Abdominal hysterectomy 1978  . Total knee arthroplasty 1997    right  . Upper gastrointestinal endoscopy 04/09/2006    w/Dilation, gastritis  . Colonoscopy w/ biopsies and polypectomy 07/22/2007, 04/28/2007    2009: diverticulosis, 2008: diverticulosis, adenomatous polyps  . Tee without cardioversion 03/24/2012    Procedure: TRANSESOPHAGEAL ECHOCARDIOGRAM (TEE);  Surgeon: Chrystie Nose, MD;  Location: [facility] OR;  Service: Cardiovascular;  Laterality: N/A;  TEE with Bubble    Family History  Problem Relation Age of Onset  . Heart disease Mother   . Heart disease Father   . Heart disease  Brother   . Stroke Mother   . Stroke Brother   . Skin cancer Brother   . Colon cancer Neg Hx   . Colon polyps Sister   . Diabetes Sister   . Diabetes Brother   . Irritable bowel syndrome Daughter     Social History:  reports that she quit smoking about 18 years ago. Her smoking use included Cigarettes. She has a 60 pack-year smoking history. She has never used smokeless tobacco. She reports that she does not drink alcohol or use illicit drugs.  Allergies:  Allergies  Allergen Reactions  . Aspirin Shortness Of Breath  . Nsaids Shortness Of Breath    Medications:     . amiodarone (NEXTERONE PREMIX) 360 mg/200 mL dextrose      . antiseptic oral rinse  15 mL Mouth Rinse q12n4p  . atorvastatin  10 mg Oral q1800  .  budesonide-formoterol  2 puff Inhalation BID  . docusate sodium  100 mg Oral BID  . DULoxetine  60 mg Oral Daily  . enoxaparin (LOVENOX) injection  40 mg Subcutaneous Q24H  . feeding supplement  1 Container Oral BID BM  . furosemide  20 mg Intravenous Once  . insulin aspart  0-15 Units Subcutaneous Q4H  . metoprolol      . metoprolol      . ondansetron      . pantoprazole  40 mg Oral Daily  . piperacillin-tazobactam (ZOSYN)  IV  3.375 g Intravenous Q8H  . potassium chloride  40 mEq Oral Once  . roflumilast  500 mcg Oral Daily  . sodium chloride  3 mL Intravenous Q12H  . tiotropium  18 mcg Inhalation Daily     Results for orders placed during the hospital encounter of 05/26/12 (from the past 48 hour(s))  URINALYSIS, ROUTINE W REFLEX MICROSCOPIC     Status: Abnormal   Collection Time   05/26/12  1:08 PM      Component Value Range Comment   Color, Urine YELLOW  YELLOW    APPearance CLOUDY (*) CLEAR    Specific Gravity, Urine 1.013  1.005 - 1.030    pH 6.0  5.0 - 8.0    Glucose, UA NEGATIVE  NEGATIVE mg/dL    Hgb urine dipstick MODERATE (*) NEGATIVE    Bilirubin Urine NEGATIVE  NEGATIVE    Ketones, ur NEGATIVE  NEGATIVE mg/dL    Protein, ur 161 (*) NEGATIVE mg/dL    Urobilinogen, UA 0.2  0.0 - 1.0 mg/dL    Nitrite NEGATIVE  NEGATIVE    Leukocytes, UA LARGE (*) NEGATIVE   URINE CULTURE     Status: Normal   Collection Time   05/26/12  1:08 PM      Component Value Range Comment   Specimen Description URINE, CATHETERIZED      Special Requests NONE      Culture  Setup Time 05/26/2012 14:22      Colony Count >=100,000 COLONIES/ML      Culture ESCHERICHIA COLI      Report Status 05/28/2012 FINAL      Organism ID, Bacteria ESCHERICHIA COLI     URINE MICROSCOPIC-ADD ON     Status: Abnormal   Collection Time   05/26/12  1:08 PM      Component Value Range Comment   WBC, UA TOO NUMEROUS TO COUNT  <3 WBC/hpf    RBC / HPF 0-2  <3 RBC/hpf RESULT CHECKED.   Bacteria, UA MANY (*) RARE    GLUCOSE, CAPILLARY  Status: Abnormal   Collection Time   05/26/12  6:10 PM      Component Value Range Comment   Glucose-Capillary 202 (*) 70 - 99 mg/dL   MAGNESIUM     Status: Normal   Collection Time   05/26/12  6:29 PM      Component Value Range Comment   Magnesium 1.8  1.5 - 2.5 mg/dL   PHOSPHORUS     Status: Normal   Collection Time   05/26/12  6:29 PM      Component Value Range Comment   Phosphorus 4.1  2.3 - 4.6 mg/dL   TSH     Status: Abnormal   Collection Time   05/26/12  6:29 PM      Component Value Range Comment   TSH 0.246 (*) 0.350 - 4.500 uIU/mL   LACTIC ACID, PLASMA     Status: Normal   Collection Time   05/26/12  6:29 PM      Component Value Range Comment   Lactic Acid, Venous 1.1  0.5 - 2.2 mmol/L   CORTISOL     Status: Normal   Collection Time   05/26/12  6:29 PM      Component Value Range Comment   Cortisol, Plasma 21.1     PROCALCITONIN     Status: Normal   Collection Time   05/26/12  8:29 PM      Component Value Range Comment   Procalcitonin 0.85     HEMOGLOBIN A1C     Status: Abnormal   Collection Time   05/26/12  8:29 PM      Component Value Range Comment   Hemoglobin A1C 6.4 (*) <5.7 %    Mean Plasma Glucose 137 (*) <117 mg/dL   MRSA PCR SCREENING     Status: Normal   Collection Time   05/26/12  9:47 PM      Component Value Range Comment   MRSA by PCR NEGATIVE  NEGATIVE   GLUCOSE, CAPILLARY     Status: Abnormal   Collection Time   05/27/12 12:30 AM      Component Value Range Comment   Glucose-Capillary 207 (*) 70 - 99 mg/dL   COMPREHENSIVE METABOLIC PANEL     Status: Abnormal   Collection Time   05/27/12  4:10 AM      Component Value Range Comment   Sodium 140  135 - 145 mEq/L    Potassium 3.9  3.5 - 5.1 mEq/L    Chloride 107  96 - 112 mEq/L    CO2 23  19 - 32 mEq/L    Glucose, Bld 162 (*) 70 - 99 mg/dL    BUN 12  6 - 23 mg/dL    Creatinine, Ser 1.61  0.50 - 1.10 mg/dL    Calcium 7.3 (*) 8.4 - 10.5 mg/dL    Total Protein 5.8  (*) 6.0 - 8.3 g/dL    Albumin 2.3 (*) 3.5 - 5.2 g/dL    AST 18  0 - 37 U/L    ALT 12  0 - 35 U/L    Alkaline Phosphatase 56  39 - 117 U/L    Total Bilirubin 0.3  0.3 - 1.2 mg/dL    GFR calc non Af Amer 85 (*) >90 mL/min    GFR calc Af Amer >90  >90 mL/min   CBC WITH DIFFERENTIAL     Status: Abnormal   Collection Time   05/27/12  4:10 AM      Component Value Range  Comment   WBC 38.1 (*) 4.0 - 10.5 K/uL    RBC 3.82 (*) 3.87 - 5.11 MIL/uL    Hemoglobin 8.0 (*) 12.0 - 15.0 g/dL    HCT 81.1 (*) 91.4 - 46.0 %    MCV 71.7 (*) 78.0 - 100.0 fL    MCH 20.9 (*) 26.0 - 34.0 pg    MCHC 29.2 (*) 30.0 - 36.0 g/dL    RDW 78.2 (*) 95.6 - 15.5 %    Platelets 233  150 - 400 K/uL    Neutrophils Relative 91 (*) 43 - 77 %    Lymphocytes Relative 4 (*) 12 - 46 %    Monocytes Relative 5  3 - 12 %    Eosinophils Relative 0  0 - 5 %    Basophils Relative 0  0 - 1 %    Neutro Abs 34.7 (*) 1.7 - 7.7 K/uL    Lymphs Abs 1.5  0.7 - 4.0 K/uL    Monocytes Absolute 1.9 (*) 0.1 - 1.0 K/uL    Eosinophils Absolute 0.0  0.0 - 0.7 K/uL    Basophils Absolute 0.0  0.0 - 0.1 K/uL    RBC Morphology POLYCHROMASIA PRESENT   ELLIPTOCYTES   WBC Morphology DOHLE BODIES     PROCALCITONIN     Status: Normal   Collection Time   05/27/12  4:10 AM      Component Value Range Comment   Procalcitonin 1.42     GLUCOSE, CAPILLARY     Status: Abnormal   Collection Time   05/27/12  4:16 AM      Component Value Range Comment   Glucose-Capillary 147 (*) 70 - 99 mg/dL   GLUCOSE, CAPILLARY     Status: Abnormal   Collection Time   05/27/12  7:50 AM      Component Value Range Comment   Glucose-Capillary 150 (*) 70 - 99 mg/dL    Comment 1 Documented in Chart      Comment 2 Notify RN     GLUCOSE, CAPILLARY     Status: Abnormal   Collection Time   05/27/12 11:59 AM      Component Value Range Comment   Glucose-Capillary 142 (*) 70 - 99 mg/dL    Comment 1 Documented in Chart      Comment 2 Notify RN     TYPE AND SCREEN      Status: Normal   Collection Time   05/27/12 12:00 PM      Component Value Range Comment   ABO/RH(D) A POS      Antibody Screen NEG      Sample Expiration 05/30/2012     ABO/RH     Status: Normal   Collection Time   05/27/12 12:00 PM      Component Value Range Comment   ABO/RH(D) A POS     GLUCOSE, CAPILLARY     Status: Abnormal   Collection Time   05/27/12  4:27 PM      Component Value Range Comment   Glucose-Capillary 101 (*) 70 - 99 mg/dL    Comment 1 Documented in Chart      Comment 2 Notify RN     GLUCOSE, CAPILLARY     Status: Abnormal   Collection Time   05/27/12  7:40 PM      Component Value Range Comment   Glucose-Capillary 205 (*) 70 - 99 mg/dL    Comment 1 Documented in Chart      Comment 2  Notify RN     GLUCOSE, CAPILLARY     Status: Abnormal   Collection Time   05/27/12 11:08 PM      Component Value Range Comment   Glucose-Capillary 134 (*) 70 - 99 mg/dL    Comment 1 Documented in Chart      Comment 2 Notify RN     COMPREHENSIVE METABOLIC PANEL     Status: Abnormal   Collection Time   05/28/12  4:04 AM      Component Value Range Comment   Sodium 141  135 - 145 mEq/L    Potassium 3.0 (*) 3.5 - 5.1 mEq/L DELTA CHECK NOTED   Chloride 106  96 - 112 mEq/L    CO2 25  19 - 32 mEq/L    Glucose, Bld 136 (*) 70 - 99 mg/dL    BUN 6  6 - 23 mg/dL    Creatinine, Ser 6.21  0.50 - 1.10 mg/dL    Calcium 7.8 (*) 8.4 - 10.5 mg/dL    Total Protein 5.9 (*) 6.0 - 8.3 g/dL    Albumin 2.3 (*) 3.5 - 5.2 g/dL    AST 17  0 - 37 U/L    ALT 12  0 - 35 U/L    Alkaline Phosphatase 61  39 - 117 U/L    Total Bilirubin 0.3  0.3 - 1.2 mg/dL    GFR calc non Af Amer >90  >90 mL/min    GFR calc Af Amer >90  >90 mL/min   CBC WITH DIFFERENTIAL     Status: Abnormal   Collection Time   05/28/12  4:04 AM      Component Value Range Comment   WBC 20.0 (*) 4.0 - 10.5 K/uL    RBC 3.91  3.87 - 5.11 MIL/uL    Hemoglobin 8.2 (*) 12.0 - 15.0 g/dL    HCT 30.8 (*) 65.7 - 46.0 %    MCV 71.1 (*)  78.0 - 100.0 fL    MCH 21.0 (*) 26.0 - 34.0 pg    MCHC 29.5 (*) 30.0 - 36.0 g/dL    RDW 84.6 (*) 96.2 - 15.5 %    Platelets 192  150 - 400 K/uL    Neutrophils Relative 85 (*) 43 - 77 %    Neutro Abs 17.1 (*) 1.7 - 7.7 K/uL    Lymphocytes Relative 7 (*) 12 - 46 %    Lymphs Abs 1.5  0.7 - 4.0 K/uL    Monocytes Relative 7  3 - 12 %    Monocytes Absolute 1.4 (*) 0.1 - 1.0 K/uL    Eosinophils Relative 0  0 - 5 %    Eosinophils Absolute 0.0  0.0 - 0.7 K/uL    Basophils Relative 0  0 - 1 %    Basophils Absolute 0.0  0.0 - 0.1 K/uL   PROCALCITONIN     Status: Normal   Collection Time   05/28/12  4:04 AM      Component Value Range Comment   Procalcitonin 0.87     GLUCOSE, CAPILLARY     Status: Abnormal   Collection Time   05/28/12  4:14 AM      Component Value Range Comment   Glucose-Capillary 121 (*) 70 - 99 mg/dL    Comment 1 Documented in Chart      Comment 2 Notify RN     GLUCOSE, CAPILLARY     Status: Abnormal   Collection Time   05/28/12  8:01 AM  Component Value Range Comment   Glucose-Capillary 146 (*) 70 - 99 mg/dL    Comment 1 Notify RN     GLUCOSE, CAPILLARY     Status: Abnormal   Collection Time   05/28/12 11:18 AM      Component Value Range Comment   Glucose-Capillary 250 (*) 70 - 99 mg/dL    Comment 1 Notify RN       US Renal Port  05/27/2012  *RADIOLOGY REPORT*  Clinical Data: Hypertensive diabetic with fever and back pain. Question abscess.  RENAL/URINARY TRACT ULTRASOUND COMPLETE  Comparison:  None.  Findings:  Right Kidney:  12.3 cm. No hydronephrosis or renal mass.  Left Kidney:  12.3 cm. No hydronephrosis or renal mass.  Bladder:  Decompressed by Foley catheter.  IMPRESSION: Portable renal sonogram without abnormality noted.  Please note that ultrasound may not detect pyelonephritis.   Original Report Authenticated By: Lacy Duverney, M.D.    Dg Abd Portable 1v  05/27/2012  *RADIOLOGY REPORT*  Clinical Data: Left-sided back pain.  Pyelonephritis.  PORTABLE  ABDOMEN - 1 VIEW  Comparison: None.  Findings: Curvilinear subcentimeter calcification roughly overlying the lower pole of the right kidney could represent a renal calculus.  No definite left renal or ureteral calculi are seen. There is mild degenerative change in the spine.  A rectal temperature probe is present.  There is no bowel obstruction.  Free air cannot be assessed on portable AP supine film.  IMPRESSION: No definite left renal calculi.  CT urogram more sensitive. Subcentimeter calcification on the right could be renal in origin. Correlate clinically.   Original Report Authenticated By: Davonna Belling, M.D.     Review of Systems  Respiratory: Positive for shortness of breath. Negative for cough.   Cardiovascular: Positive for palpitations. Negative for chest pain and leg swelling.  Gastrointestinal: Positive for nausea and abdominal pain. Negative for vomiting.  Neurological: Negative for dizziness.   Blood pressure 162/96, pulse 94, temperature 97.8 F (36.6 C), temperature source Oral, resp. rate 23, height 5\' 5"  (1.651 m), weight 110.5 kg (243 lb 9.7 oz), SpO2 97.00%. Physical Exam  Constitutional: She is oriented to person, place, and time. She appears distressed.       Morbidly obese  HENT:  Head: Normocephalic and atraumatic.  Eyes: EOM are normal. Pupils are equal, round, and reactive to light.  Neck: Normal range of motion. Neck supple.  Cardiovascular: An irregularly irregular rhythm present. Tachycardia present.   No murmur heard. Pulses:      Radial pulses are 2+ on the right side, and 2+ on the left side.       Dorsalis pedis pulses are 2+ on the right side, and 2+ on the left side.  Respiratory: Effort normal and breath sounds normal. She has no wheezes. She has no rales.       Bipap was in place during exam.  GI: Soft. There is tenderness (LU/LQ, Left flank.).  Musculoskeletal: She exhibits no edema.  Lymphadenopathy:    She has no cervical adenopathy.  Neurological:  She is alert and oriented to person, place, and time.  Skin: Skin is warm and dry.  Psychiatric: She has a normal mood and affect.    Assessment/Plan: Patient Active Hospital Problem List: Afib RVR Diabetes mellitus type 2 with neurological manifestations (07/29/2007) COPD (chronic obstructive pulmonary disease) (09/11/2010) Morbidly obese () Sepsis(995.91) (05/26/2012) Pyelonephritis (05/26/2012) Encephalopathy acute (05/26/2012)  Plan:  Upon seeing her, her HR was 160's in afib.  A 10mg  dilt bolus  was given then two separate doses of 2.5mg  IV lopressor.  Her HR responded to the lopressor by decreasing to the 110's.  She was on bipap at the time.  This was removed and she felt more comfortable.  There are concerns with long term Amiodarone usage given her COPD,.  We will try 24 hours IV Amio and see if she converts.  Will also add IV heparin.  Destiny Shelton, Destiny Shelton 05/28/2012, 1:04 PM   I have seen and examined the patient along with Destiny Finlay, PA.  I have reviewed the chart, notes and new data.  I agree with PA's note.  AF with RVR of recent onset in a patient with COPD/cor pulmonale and LV diastolic dysfunction, in the setting of urinary infection related sepsis. She is going in and out of AF as She is being examined. When in AF, rate control remains poor despite high dose diltiazem and beta blockers. Recommend short term amiodarone therapy and heparin anticoagulation. May not need either one long term.  Destiny Fair, MD, [facility] [phone] 05/28/2012, 2:35 PM

## 2012-05-28 NOTE — Progress Notes (Signed)
ANTICOAGULATION CONSULT NOTE - Initial Consult  Pharmacy Consult for heparin Indication: atrial fibrillation  Allergies  Allergen Reactions  . Aspirin Shortness Of Breath  . Nsaids Shortness Of Breath    Patient Measurements: Height: 5\' 5"  (165.1 cm) Weight: 243 lb 9.7 oz (110.5 kg) IBW/kg (Calculated) : 57  Heparin Dosing Weight: 75 kg  Vital Signs: Temp: 97.8 F (36.6 C) (12/27 1119) Temp src: Oral (12/27 1119) BP: 162/96 mmHg (12/27 1245) Pulse Rate: 94  (12/27 1245)  Labs:  Basename 05/28/12 1245 05/28/12 0404 05/27/12 0410 05/26/12 1256 05/26/12 1221 05/26/12 1219  HGB -- 8.2* 8.0* -- -- --  HCT -- 27.8* 27.4* -- -- 33.4*  PLT -- 192 233 -- -- 287  APTT -- -- -- 36 -- --  LABPROT -- -- -- 15.1 -- --  INR -- -- -- 1.21 -- --  HEPARINUNFRC -- -- -- -- -- --  CREATININE 0.58 0.61 0.78 -- -- --  CKTOTAL -- -- -- -- -- --  CKMB -- -- -- -- -- --  TROPONINI <0.30 -- -- -- <0.30 --    Estimated Creatinine Clearance: 84.5 ml/min (by C-G formula based on Cr of 0.58).   Medical History: Past Medical History  Diagnosis Date  . ALLERGIC RHINITIS   . Sleep apnea   . Hypertension   . Adenomatous colon polyp   . Gastritis   . External hemorrhoids   . Diverticulosis of colon   . Diabetes mellitus type 2 with neurological manifestations     On Insulin  . Depression   . Osteoporosis   . Morbidly obese   . Nephrolithiasis   . Back pain, chronic   . Osteoarthritis   . Anxiety   . Hyperlipemia   . Kidney stones     hx of   . Atrial tachycardia     Mostly Sinus Tachycardia  . Asthma   . Inappropriate sinus tachycardia   . COPD mixed type     Requiring Home O2 at 4L  . Shortness of breath   . GERD (gastroesophageal reflux disease)   . Headache     headaches  . Neuromuscular disorder     diabetic neuropathy  . Fibromyalgia     "pain in arms and shoulders."    Medications:  Prescriptions prior to admission  Medication Sig Dispense Refill  . albuterol  (VENTOLIN HFA) 108 (90 BASE) MCG/ACT inhaler Inhale 2 puffs into the lungs every 6 (six) hours as needed. For shortness of breath      . alendronate (FOSAMAX) 70 MG tablet Take 70 mg by mouth every 7 (seven) days. Take with a full glass of water on an empty stomach. Takes on Thursday.      . ALPRAZolam (XANAX) 0.5 MG tablet Take 0.5-1 mg by mouth at bedtime as needed. For anxiety      . benzonatate (TESSALON) 200 MG capsule Take 200 mg by mouth 3 (three) times daily as needed. For cough      . budesonide-formoterol (SYMBICORT) 160-4.5 MCG/ACT inhaler Inhale 2 puffs into the lungs 2 (two) times daily.        Marland Kitchen CALCIUM-VITAMIN D PO Take 1 tablet by mouth 2 (two) times daily.       . cyclobenzaprine (FLEXERIL) 10 MG tablet Take 10 mg by mouth at bedtime.       . docusate sodium (COLACE) 100 MG capsule Take 100 mg by mouth 2 (two) times daily.        . DULoxetine (  CYMBALTA) 60 MG capsule Take 60 mg by mouth daily.        . furosemide (LASIX) 20 MG tablet Take 20 mg by mouth daily.       Marland Kitchen gabapentin (NEURONTIN) 600 MG tablet Take 600 mg by mouth 3 (three) times daily as needed. For pain      . hydrocodone-acetaminophen (LORCET-HD) 5-500 MG per capsule Take 1 capsule by mouth every 8 (eight) hours as needed. For pain      . HYDROcodone-homatropine (HYCODAN) 5-1.5 MG/5ML syrup Take 5 mLs by mouth at bedtime as needed. For cough      . insulin aspart (NOVOLOG) 100 UNIT/ML injection Inject 10 Units into the skin 2 (two) times daily.       . insulin glargine (LANTUS) 100 UNIT/ML injection Inject 84 Units into the skin daily before breakfast.       . metFORMIN (GLUCOPHAGE) 500 MG tablet Take 1,000 mg by mouth 2 (two) times daily with a meal. Re-start as previously ordered 48 hours post cath      . Multiple Vitamin (MULTIVITAMIN) tablet Take 1 tablet by mouth daily.        . Omega-3 Fatty Acids (FISH OIL) 1000 MG CAPS Take 1 capsule by mouth daily.        Marland Kitchen omeprazole (PRILOSEC) 20 MG capsule Take 20 mg by  mouth daily.        . potassium chloride (KLOR-CON) 10 MEQ CR tablet Take 20-40 mEq by mouth 2 (two) times daily. Takes in AM and in PM      . pravastatin (PRAVACHOL) 40 MG tablet Take 80 mg by mouth at bedtime.       . roflumilast (DALIRESP) 500 MCG TABS tablet Take 500 mcg by mouth daily.        Marland Kitchen tiotropium (SPIRIVA) 18 MCG inhalation capsule Place 18 mcg into inhaler and inhale daily.        . verapamil (VERELAN PM) 360 MG 24 hr capsule Take 360 mg by mouth daily.        Assessment: 66 yo lady to start heparin therapy for afib.  Goal of Therapy:  Heparin level 0.3-0.7 units/ml Monitor platelets by anticoagulation protocol: Yes   Plan:  Heparin bolus 4000 units Start heparin drip at 1100 units/hr Check HL and CBC 8 hours after start then daily while on heparin. Monitor for s&s bleeding.  Ayyub Krall Poteet 05/28/2012,3:12 PM

## 2012-05-28 NOTE — Care Management Note (Addendum)
    Page 1 of 1   05/31/2012     1:03:35 PM   CARE MANAGEMENT NOTE 05/31/2012  Patient:  Destiny Shelton, Destiny Shelton   Account Number:  [id number]  Date Initiated:  05/27/2012  Documentation initiated by:  Gerardo Caiazzo  Subjective/Objective Assessment:   66 yr-old female adm with dx of sepsis; lives with spouse, has home O2 through [employer]     DC Planning Services  CM consult      [facility] Choice  HOME HEALTH   Choice offered to / List presented to:  C-3 Spouse       HH arranged  HH-1 RN  HH-2 PT      HH agency  [facility]   Status of service:  Completed, signed off  Per UR Regulation:  Reviewed for med. necessity/level of care/duration of stay  Comments:  PCP: Dr Lacretia Nicks [place] Kos  05/30/12 1254 Lissett Favorite RN BSN MSN CCM PT recommends home therapy, pt agrees with RN and PT, states spouse will assist her with bathing and dressing, does not want OT.  Referral made per choice.  05/28/12 1430 Duc Crocket RN BSN MSN CCM Met with pt and spouse @ bedside and discussed home health services.  Per spouse, pt was capable of self-care except for some IADLs PTA.  Pt will need RN to assess and monitor heart and lung sounds, VS, O2 sats, will also need PT safety eval.  Provided list of [facility] home health agencies - spouse indicates that he prefers [facility] - will continue to follow and make referral when pt medically stable.

## 2012-05-28 NOTE — Progress Notes (Signed)
Called at the bedside; the patient with AFib with RVR; complaining of dyspnea, denies chest pain; CXR, cardiac enzymes and magnesium pending.   Plan: initiate Cardizem drip; consult cardiology; the patient is a patient of Dr. Herbie Baltimore.

## 2012-05-28 NOTE — Progress Notes (Addendum)
PULMONARY  / CRITICAL CARE MEDICINE  Name: Destiny Shelton MRN: [id number] DOB: [birth year]    LOS: 2  REFERRING MD:  [facility]  CHIEF COMPLAINT:  Sepsis  BRIEF PATIENT DESCRIPTION: 66 yo with multiple comorbidities admitted with symptoms of UTI and hypotension.   LINES / TUBES: Foley 12/25 >>>  CULTURES: 12/25  Blood >>> 12/25  Urine >>>  ANTIBIOTICS: Zosyn 12/25 >>> Vancomycin 12/25 >>>  SIGNIFICANT EVENTS:  12/25 Admitted for UTI / hypotension   DVT Px:  Lovenox  GI Px:  Protonix  The patient is encephalopathic and unable to provide history, which was obtained for available medical records.  HISTORY OF PRESENT ILLNESS:  66 yo with multiple comorbidities admitted with symptoms of UTI and hypotension.Off pressors for the past 24h. Home oxygen dependent, currently on 5L of oxygen via [place]. No acute complaints. Tachycardic.   VITAL SIGNS: Temp:  [97.9 F (36.6 C)-98.5 F (36.9 C)] 98.5 F (36.9 C) (12/27 0400) Pulse Rate:  [104-125] 124  (12/27 0700) Resp:  [21-33] 29  (12/27 0700) BP: (99-140)/(42-90) 138/60 mmHg (12/27 0500) SpO2:  [90 %-97 %] 94 % (12/27 0803) Arterial Line BP: (109-157)/(43-71) 148/58 mmHg (12/27 0700) Weight:  [110.5 kg (243 lb 9.7 oz)] 110.5 kg (243 lb 9.7 oz) (12/27 0500)     INTAKE / OUTPUT: Intake/Output      12/26 0701 - 12/27 0700 12/27 0701 - 12/28 0700   P.O. 1560    I.V. (mL/kg) 150 (1.4)    IV Piggyback 662.5    Total Intake(mL/kg) 2372.5 (21.5)    Urine (mL/kg/hr) 3735 (1.4)    Total Output 3735    Net -1362.5          PHYSICAL EXAMINATION: General:  No acute distress Neuro:  Alert and appropriate; answering questions  HEENT:  PERRL Cardiovascular:  RRR, no m/r/g Lungs:  Bilateral diminished air entry, no w/r/r Abdomen:  Soft, CVA tendernes, bowel sounds diminished Musculoskeletal:  Moves all extremities Skin:  Intact  LABS:  Lab 05/28/12 0404 05/27/12 0410 05/26/12 2029 05/26/12 1829 05/26/12 1301 05/26/12  1256 05/26/12 1221 05/26/12 1219  HGB 8.2* 8.0* -- -- -- -- -- 9.8*  WBC 20.0* 38.1* -- -- -- -- -- 28.1*  PLT 192 233 -- -- -- -- -- 287  NA 141 140 -- -- -- -- -- 134*  K 3.0* 3.9 -- -- -- -- -- --  CL 106 107 -- -- -- -- -- 95*  CO2 25 23 -- -- -- -- -- 26  GLUCOSE 136* 162* -- -- -- -- -- 121*  BUN 6 12 -- -- -- -- -- 14  CREATININE 0.61 0.78 -- -- -- -- -- 1.06  CALCIUM 7.8* 7.3* -- -- -- -- -- 8.7  MG -- -- -- 1.8 -- -- -- --  PHOS -- -- -- 4.1 -- -- -- --  AST 17 18 -- -- -- 18 -- --  ALT 12 12 -- -- -- 10 -- --  ALKPHOS 61 56 -- -- -- 86 -- --  BILITOT 0.3 0.3 -- -- -- 0.6 -- --  PROT 5.9* 5.8* -- -- -- 7.3 -- --  ALBUMIN 2.3* 2.3* -- -- -- 3.0* -- --  APTT -- -- -- -- -- 36 -- --  INR -- -- -- -- -- 1.21 -- --  LATICACIDVEN -- -- -- 1.1 2.13 -- -- --  TROPONINI -- -- -- -- -- -- <0.30 --  PROCALCITON 0.87 1.42 0.85 -- -- -- -- --  PROBNP -- -- -- -- -- -- 255.6* --  O2SATVEN -- -- -- -- -- -- -- --  PHART -- -- -- -- 7.382 -- -- --  PCO2ART -- -- -- -- 47.7* -- -- --  PO2ART -- -- -- -- 102.0* -- -- --    Lab 05/28/12 0801 05/28/12 0414 05/27/12 2308 05/27/12 1940 05/27/12 1627  GLUCAP 146* 121* 134* 205* 101*   IMAGING: 12/25  PCXR >>> No overt airspace disease   ECG: 12/25  12 lead ECG >>> Sinus tachycardia, no ST-T changes  DIAGNOSES: Active Problems:  Diabetes mellitus type 2 with neurological manifestations  COPD (chronic obstructive pulmonary disease)  Morbidly obese  Sepsis(995.91)  Pyelonephritis  Encephalopathy acute  ASSESSMENT / PLAN:  PULMONARY  A:  COPD (oxygen dependent) without evidence of exacerbation.  OSA (not on positive pressure therapy).   P:   [place] SpO2>90 Supplemental oxygen; will need a walk for desaturation before discharge.  Restart Albuterol, Symbicort,  Albuterol / Atrovent neb treatments Continue Daliresp  CARDIOVASCULAR  A:  Sepsis, likely secondary to pyelonephritis;      Sinus tachycardia  P:  WBC improving;  patient afebrile; R [place] with no evidence of hydronephrosis.  Restart Verapamil; closely monitor HR and BP  RENAL  A:  Dehydration / hypovolemia.Resolved.  P:   Restart Lasix, replace K;  Cont Vanc and Zosyn; adjust antibiotics according to culture and sensitivities.   GASTROINTESTINAL A:  No active issues. P:   Advance diet as tolerated; low Na, fluid restriction.    HEMATOLOGIC A:  Anemia. P:  Trend CBC  INFECTIOUS A:  Pyelonephritis. P:   Cultures and antibiotics as above   ENDOCRINE  A:  DM II.  Hyperglycemia. P:   SSI Hold Lantus as on clear liquid diet   NEUROLOGIC A:  Encephalopathy (septic, metabolic). P:   Hold Xanax, Neurontin, Hydrocodone Continue Cymbalta  CLINICAL SUMMARY: 66 yo with multiple comorbidities admitted with symptoms of UTI and hypotension. Hypotension resolved.  Vancomycin / Zosyn, adjust antibiotics.  Hold opioids / sedatives.  SSI.  I have personally obtained a history, examined the patient, evaluated laboratory and imaging results, formulated the assessment and plan and placed orders.  CRITICAL CARE:  The patient is critically ill with multiple organ systems failure and requires high complexity decision making for assessment and support, frequent evaluation and titration of therapies, application of advanced monitoring technologies and extensive interpretation of multiple databases. Critical Care Time devoted to patient care services described in this note is 30 minutes.   Thank you so much for the consult. We are going to sign off. Please do not hesitate to call with any questions.   Vanetta Mulders, MD  Pulmonary and Critical Care Medicine [facility] Pager: [phone]  05/28/2012, 8:38 AM

## 2012-05-28 NOTE — Progress Notes (Signed)
eLink Physician-Brief Progress Note Patient Name: Destiny Shelton DOB: [birth year] MRN: [id number]  Date of Service  05/28/2012   HPI/Events of Note  K 3.2   eICU Interventions  kcl supp given   Intervention Category Major Interventions: Electrolyte abnormality - evaluation and management  Shan Levans 05/28/2012, 7:36 PM

## 2012-05-29 ENCOUNTER — Inpatient Hospital Stay (HOSPITAL_COMMUNITY): Payer: Medicare Other

## 2012-05-29 DIAGNOSIS — I480 Paroxysmal atrial fibrillation: Secondary | ICD-10-CM | POA: Diagnosis not present

## 2012-05-29 DIAGNOSIS — J9620 Acute and chronic respiratory failure, unspecified whether with hypoxia or hypercapnia: Secondary | ICD-10-CM | POA: Diagnosis not present

## 2012-05-29 LAB — CBC WITH DIFFERENTIAL/PLATELET
Basophils Relative: 0 % (ref 0–1)
Eosinophils Absolute: 0.1 10*3/uL (ref 0.0–0.7)
Hemoglobin: 8.6 g/dL — ABNORMAL LOW (ref 12.0–15.0)
MCH: 20.5 pg — ABNORMAL LOW (ref 26.0–34.0)
MCHC: 28.6 g/dL — ABNORMAL LOW (ref 30.0–36.0)
Monocytes Absolute: 1.2 10*3/uL — ABNORMAL HIGH (ref 0.1–1.0)
Monocytes Relative: 8 % (ref 3–12)
Neutrophils Relative %: 79 % — ABNORMAL HIGH (ref 43–77)

## 2012-05-29 LAB — GLUCOSE, CAPILLARY
Glucose-Capillary: 143 mg/dL — ABNORMAL HIGH (ref 70–99)
Glucose-Capillary: 246 mg/dL — ABNORMAL HIGH (ref 70–99)
Glucose-Capillary: 155 mg/dL — ABNORMAL HIGH (ref 70–99)

## 2012-05-29 LAB — COMPREHENSIVE METABOLIC PANEL
ALT: 11 U/L (ref 0–35)
AST: 12 U/L (ref 0–37)
Alkaline Phosphatase: 68 U/L (ref 39–117)
CO2: 29 mEq/L (ref 19–32)
Calcium: 8 mg/dL — ABNORMAL LOW (ref 8.4–10.5)
Chloride: 102 mEq/L (ref 96–112)
GFR calc non Af Amer: >90 mL/min (ref >90)
Potassium: 3.4 mEq/L — ABNORMAL LOW (ref 3.5–5.1)
Sodium: 141 mEq/L (ref 135–145)

## 2012-05-29 LAB — FOLATE: Folate: >20 ng/mL

## 2012-05-29 LAB — IRON AND TIBC: Iron: 10 ug/dL — ABNORMAL LOW (ref 42–135)

## 2012-05-29 MED ORDER — LEVALBUTEROL HCL 0.63 MG/3ML IN NEBU
0.6300 mg | INHALATION_SOLUTION | Freq: Four times a day (QID) | RESPIRATORY_TRACT | Status: DC | PRN
  Administered 2012-05-30: 0.63 mg via RESPIRATORY_TRACT
  Filled 2012-05-29 (×2): qty 3

## 2012-05-29 MED ORDER — RIVAROXABAN 20 MG PO TABS
20.0000 mg | ORAL_TABLET | Freq: Every day | ORAL | Status: DC
  Administered 2012-05-29 – 2012-05-31 (×3): 20 mg via ORAL
  Filled 2012-05-29 (×4): qty 1

## 2012-05-29 MED ORDER — POTASSIUM CHLORIDE CRYS ER 20 MEQ PO TBCR
40.0000 meq | EXTENDED_RELEASE_TABLET | Freq: Once | ORAL | Status: AC
  Administered 2012-05-29: 40 meq via ORAL

## 2012-05-29 MED ORDER — VERAPAMIL HCL ER 180 MG PO TBCR
360.0000 mg | EXTENDED_RELEASE_TABLET | Freq: Every day | ORAL | Status: DC
  Administered 2012-05-29 – 2012-05-31 (×3): 360 mg via ORAL
  Filled 2012-05-29 (×5): qty 2

## 2012-05-29 MED ORDER — HEPARIN BOLUS VIA INFUSION
2000.0000 [IU] | Freq: Once | INTRAVENOUS | Status: AC
  Administered 2012-05-29: 2000 [IU] via INTRAVENOUS
  Filled 2012-05-29: qty 2000

## 2012-05-29 MED ORDER — RIVAROXABAN 20 MG PO TABS
20.0000 mg | ORAL_TABLET | Freq: Every day | ORAL | Status: DC
  Filled 2012-05-29: qty 1

## 2012-05-29 MED ORDER — AMIODARONE HCL 200 MG PO TABS
400.0000 mg | ORAL_TABLET | Freq: Two times a day (BID) | ORAL | Status: DC
  Administered 2012-05-29 – 2012-05-31 (×6): 400 mg via ORAL
  Filled 2012-05-29 (×8): qty 2

## 2012-05-29 NOTE — Progress Notes (Signed)
[facility]  DAILY PROGRESS NOTE   Subjective:  Feels better - stronger and less dyspneic. Maintaining sinus rhythm, albeit still with tachycardia.  Objective:  Temp:  [97.8 F (36.6 C)-98.4 F (36.9 C)] 97.8 F (36.6 C) (12/28 0753) Pulse Rate:  [42-172] 109  (12/28 0900) Resp:  [16-31] 23  (12/28 0900) BP: (108-162)/(51-117) 120/55 mmHg (12/28 0900) SpO2:  [84 %-98 %] 93 % (12/28 0900) FiO2 (%):  [40 %-50 %] 50 % (12/28 0800) Weight:  [106 kg (233 lb 11 oz)] 106 kg (233 lb 11 oz) (12/28 0500) Weight change: -4.5 kg (-9 lb 14.7 oz)  Intake/Output from previous day: 12/27 0701 - 12/28 0700 In: 1750.2 [P.O.:360; I.V.:1277.7; IV Piggyback:112.5] Out: 8810 [Urine:8810]  Intake/Output from this shift: Total I/O In: 577.4 [P.O.:460; I.V.:92.4; IV Piggyback:25] Out: 120 [Urine:120]  Medications: Current Facility-Administered Medications  Medication Dose Route Frequency Provider Last Rate Last Dose  . acetaminophen (TYLENOL) tablet 650 mg  650 mg Oral Q6H PRN Zigmund Gottron, MD   650 mg at 05/29/12 0805  . amiodarone (NEXTERONE PREMIX) 360 mg/200 mL dextrose IV infusion  30 mg/hr Intravenous Continuous Wilburt Finlay, PA 16.7 mL/hr at 05/29/12 0700 30 mg/hr at 05/29/12 0700  . amiodarone (PACERONE) tablet 400 mg  400 mg Oral BID Nabila Albarracin, MD      . antiseptic oral rinse (BIOTENE) solution 15 mL  15 mL Mouth Rinse q12n4p Konstantin Zubelevitskiy, MD   15 mL at 05/28/12 1600  . atorvastatin (LIPITOR) tablet 10 mg  10 mg Oral q1800 Kari Baars, MD   10 mg at 05/28/12 1800  . benzonatate (TESSALON) capsule 200 mg  200 mg Oral TID PRN Ezequiel Kayser, MD      . budesonide-formoterol (SYMBICORT) 160-4.5 MCG/ACT inhaler 2 puff  2 puff Inhalation BID Roxine Caddy, MD   2 puff at 05/29/12 0717  . docusate sodium (COLACE) capsule 100 mg  100 mg Oral BID Ezequiel Kayser, MD   100 mg at 05/27/12 2159  . DULoxetine (CYMBALTA) DR capsule 60 mg  60 mg Oral  Daily Ezequiel Kayser, MD   60 mg at 05/28/12 1000  . feeding supplement (RESOURCE BREEZE) liquid 1 Container  1 Container Oral BID BM Ailene Ards, RD   1 Container at 05/27/12 1500  . insulin aspart (novoLOG) injection 0-15 Units  0-15 Units Subcutaneous Q4H Lonia Farber, MD   2 Units at 05/29/12 0753  . levalbuterol (XOPENEX) nebulizer solution 0.63 mg  0.63 mg Nebulization Q6H PRN Kari Baars, MD      . pantoprazole (PROTONIX) EC tablet 40 mg  40 mg Oral Daily Ezequiel Kayser, MD   40 mg at 05/28/12 1000  . piperacillin-tazobactam (ZOSYN) IVPB 3.375 g  3.375 g Intravenous Q8H W Buren Kos, MD   3.375 g at 05/29/12 0514  . potassium chloride SA (K-DUR,KLOR-CON) CR tablet 40 mEq  40 mEq Oral BID Storm Frisk, MD   40 mEq at 05/28/12 2117  . potassium chloride SA (K-DUR,KLOR-CON) CR tablet 40 mEq  40 mEq Oral Once W Buren Kos, MD      . Rivaroxaban Carlena Hurl) tablet 20 mg  20 mg Oral Daily Merrit Friesen, MD      . roflumilast (DALIRESP) tablet 500 mcg  500 mcg Oral Daily Ezequiel Kayser, MD   500 mcg at 05/28/12 1000  . sodium chloride 0.9 % injection 3 mL  3 mL Intravenous Q12H Mark A  Perini, MD   3 mL at 05/28/12 1018  . tiotropium (SPIRIVA) inhalation capsule 18 mcg  18 mcg Inhalation Daily Roxine Caddy, MD   18 mcg at 05/29/12 0718  . verapamil (CALAN-SR) CR tablet 360 mg  360 mg Oral Daily Thurmon Fair, MD        Physical Exam: General appearance: alert, cooperative and no distress Neck: no adenopathy, no carotid bruit, no JVD, supple, symmetrical, trachea midline and thyroid not enlarged, symmetric, no tenderness/mass/nodules Lungs: wheezes bilaterally Heart: regular rate and rhythm, S1, S2 normal and no S3 or S4 Abdomen: soft, non-tender; bowel sounds normal; no masses,  no organomegaly Extremities: extremities normal, atraumatic, no cyanosis or edema Neurologic: Grossly normal  Lab Results: Results for orders placed during the hospital encounter of  05/26/12 (from the past 48 hour(s))  GLUCOSE, CAPILLARY     Status: Abnormal   Collection Time   05/27/12 11:59 AM      Component Value Range Comment   Glucose-Capillary 142 (*) 70 - 99 mg/dL    Comment 1 Documented in Chart      Comment 2 Notify RN     TYPE AND SCREEN     Status: Normal   Collection Time   05/27/12 12:00 PM      Component Value Range Comment   ABO/RH(D) A POS      Antibody Screen NEG      Sample Expiration 05/30/2012     ABO/RH     Status: Normal   Collection Time   05/27/12 12:00 PM      Component Value Range Comment   ABO/RH(D) A POS     GLUCOSE, CAPILLARY     Status: Abnormal   Collection Time   05/27/12  4:27 PM      Component Value Range Comment   Glucose-Capillary 101 (*) 70 - 99 mg/dL    Comment 1 Documented in Chart      Comment 2 Notify RN     GLUCOSE, CAPILLARY     Status: Abnormal   Collection Time   05/27/12  7:40 PM      Component Value Range Comment   Glucose-Capillary 205 (*) 70 - 99 mg/dL    Comment 1 Documented in Chart      Comment 2 Notify RN     GLUCOSE, CAPILLARY     Status: Abnormal   Collection Time   05/27/12 11:08 PM      Component Value Range Comment   Glucose-Capillary 134 (*) 70 - 99 mg/dL    Comment 1 Documented in Chart      Comment 2 Notify RN     COMPREHENSIVE METABOLIC PANEL     Status: Abnormal   Collection Time   05/28/12  4:04 AM      Component Value Range Comment   Sodium 141  135 - 145 mEq/L    Potassium 3.0 (*) 3.5 - 5.1 mEq/L DELTA CHECK NOTED   Chloride 106  96 - 112 mEq/L    CO2 25  19 - 32 mEq/L    Glucose, Bld 136 (*) 70 - 99 mg/dL    BUN 6  6 - 23 mg/dL    Creatinine, Ser 1.61  0.50 - 1.10 mg/dL    Calcium 7.8 (*) 8.4 - 10.5 mg/dL    Total Protein 5.9 (*) 6.0 - 8.3 g/dL    Albumin 2.3 (*) 3.5 - 5.2 g/dL    AST 17  0 - 37 U/L    ALT  12  0 - 35 U/L    Alkaline Phosphatase 61  39 - 117 U/L    Total Bilirubin 0.3  0.3 - 1.2 mg/dL    GFR calc non Af Amer >90  >90 mL/min    GFR calc Af Amer >90  >90  mL/min   CBC WITH DIFFERENTIAL     Status: Abnormal   Collection Time   05/28/12  4:04 AM      Component Value Range Comment   WBC 20.0 (*) 4.0 - 10.5 K/uL    RBC 3.91  3.87 - 5.11 MIL/uL    Hemoglobin 8.2 (*) 12.0 - 15.0 g/dL    HCT 16.1 (*) 09.6 - 46.0 %    MCV 71.1 (*) 78.0 - 100.0 fL    MCH 21.0 (*) 26.0 - 34.0 pg    MCHC 29.5 (*) 30.0 - 36.0 g/dL    RDW 04.5 (*) 40.9 - 15.5 %    Platelets 192  150 - 400 K/uL    Neutrophils Relative 85 (*) 43 - 77 %    Neutro Abs 17.1 (*) 1.7 - 7.7 K/uL    Lymphocytes Relative 7 (*) 12 - 46 %    Lymphs Abs 1.5  0.7 - 4.0 K/uL    Monocytes Relative 7  3 - 12 %    Monocytes Absolute 1.4 (*) 0.1 - 1.0 K/uL    Eosinophils Relative 0  0 - 5 %    Eosinophils Absolute 0.0  0.0 - 0.7 K/uL    Basophils Relative 0  0 - 1 %    Basophils Absolute 0.0  0.0 - 0.1 K/uL   PROCALCITONIN     Status: Normal   Collection Time   05/28/12  4:04 AM      Component Value Range Comment   Procalcitonin 0.87     GLUCOSE, CAPILLARY     Status: Abnormal   Collection Time   05/28/12  4:14 AM      Component Value Range Comment   Glucose-Capillary 121 (*) 70 - 99 mg/dL    Comment 1 Documented in Chart      Comment 2 Notify RN     GLUCOSE, CAPILLARY     Status: Abnormal   Collection Time   05/28/12  8:01 AM      Component Value Range Comment   Glucose-Capillary 146 (*) 70 - 99 mg/dL    Comment 1 Notify RN     GLUCOSE, CAPILLARY     Status: Abnormal   Collection Time   05/28/12 11:18 AM      Component Value Range Comment   Glucose-Capillary 250 (*) 70 - 99 mg/dL    Comment 1 Notify RN     BASIC METABOLIC PANEL     Status: Abnormal   Collection Time   05/28/12 12:45 PM      Component Value Range Comment   Sodium 139  135 - 145 mEq/L    Potassium 3.2 (*) 3.5 - 5.1 mEq/L    Chloride 99  96 - 112 mEq/L    CO2 26  19 - 32 mEq/L    Glucose, Bld 239 (*) 70 - 99 mg/dL    BUN 7  6 - 23 mg/dL    Creatinine, Ser 8.11  0.50 - 1.10 mg/dL    Calcium 8.4  8.4 - 91.4  mg/dL    GFR calc non Af Amer >90  >90 mL/min    GFR calc Af Amer >90  >90 mL/min  MAGNESIUM     Status: Normal   Collection Time   05/28/12 12:45 PM      Component Value Range Comment   Magnesium 1.8  1.5 - 2.5 mg/dL   TROPONIN I     Status: Normal   Collection Time   05/28/12 12:45 PM      Component Value Range Comment   Troponin I <0.30  <0.30 ng/mL   PRO B NATRIURETIC PEPTIDE     Status: Abnormal   Collection Time   05/28/12 12:48 PM      Component Value Range Comment   Pro B Natriuretic peptide (BNP) 1257.0 (*) 0 - 125 pg/mL   GLUCOSE, CAPILLARY     Status: Abnormal   Collection Time   05/28/12  3:51 PM      Component Value Range Comment   Glucose-Capillary 182 (*) 70 - 99 mg/dL    Comment 1 Notify RN     TROPONIN I     Status: Normal   Collection Time   05/28/12  5:43 PM      Component Value Range Comment   Troponin I <0.30  <0.30 ng/mL   GLUCOSE, CAPILLARY     Status: Abnormal   Collection Time   05/28/12  7:34 PM      Component Value Range Comment   Glucose-Capillary 151 (*) 70 - 99 mg/dL    Comment 1 Notify RN     GLUCOSE, CAPILLARY     Status: Abnormal   Collection Time   05/28/12 11:38 PM      Component Value Range Comment   Glucose-Capillary 132 (*) 70 - 99 mg/dL   TROPONIN I     Status: Normal   Collection Time   05/29/12 12:40 AM      Component Value Range Comment   Troponin I <0.30  <0.30 ng/mL   HEPARIN LEVEL (UNFRACTIONATED)     Status: Abnormal   Collection Time   05/29/12 12:44 AM      Component Value Range Comment   Heparin Unfractionated <0.10 (*) 0.30 - 0.70 IU/mL   GLUCOSE, CAPILLARY     Status: Abnormal   Collection Time   05/29/12  3:54 AM      Component Value Range Comment   Glucose-Capillary 131 (*) 70 - 99 mg/dL   COMPREHENSIVE METABOLIC PANEL     Status: Abnormal   Collection Time   05/29/12  5:28 AM      Component Value Range Comment   Sodium 141  135 - 145 mEq/L    Potassium 3.4 (*) 3.5 - 5.1 mEq/L    Chloride 102  96 - 112  mEq/L    CO2 29  19 - 32 mEq/L    Glucose, Bld 155 (*) 70 - 99 mg/dL    BUN 7  6 - 23 mg/dL    Creatinine, Ser 9.60  0.50 - 1.10 mg/dL    Calcium 8.0 (*) 8.4 - 10.5 mg/dL    Total Protein 6.4  6.0 - 8.3 g/dL    Albumin 2.4 (*) 3.5 - 5.2 g/dL    AST 12  0 - 37 U/L    ALT 11  0 - 35 U/L    Alkaline Phosphatase 68  39 - 117 U/L    Total Bilirubin 0.3  0.3 - 1.2 mg/dL    GFR calc non Af Amer >90  >90 mL/min    GFR calc Af Amer >90  >90 mL/min   CBC WITH DIFFERENTIAL  Status: Abnormal   Collection Time   05/29/12  5:28 AM      Component Value Range Comment   WBC 15.4 (*) 4.0 - 10.5 K/uL    RBC 4.19  3.87 - 5.11 MIL/uL    Hemoglobin 8.6 (*) 12.0 - 15.0 g/dL    HCT 16.1 (*) 09.6 - 46.0 %    MCV 71.8 (*) 78.0 - 100.0 fL    MCH 20.5 (*) 26.0 - 34.0 pg    MCHC 28.6 (*) 30.0 - 36.0 g/dL    RDW 04.5 (*) 40.9 - 15.5 %    Platelets 216  150 - 400 K/uL    Neutrophils Relative 79 (*) 43 - 77 %    Neutro Abs 12.1 (*) 1.7 - 7.7 K/uL    Lymphocytes Relative 13  12 - 46 %    Lymphs Abs 1.9  0.7 - 4.0 K/uL    Monocytes Relative 8  3 - 12 %    Monocytes Absolute 1.2 (*) 0.1 - 1.0 K/uL    Eosinophils Relative 1  0 - 5 %    Eosinophils Absolute 0.1  0.0 - 0.7 K/uL    Basophils Relative 0  0 - 1 %    Basophils Absolute 0.0  0.0 - 0.1 K/uL   GLUCOSE, CAPILLARY     Status: Abnormal   Collection Time   05/29/12  7:52 AM      Component Value Range Comment   Glucose-Capillary 143 (*) 70 - 99 mg/dL    Comment 1 Notify RN       Imaging: US Renal Port  05/27/2012  *RADIOLOGY REPORT*  Clinical Data: Hypertensive diabetic with fever and back pain. Question abscess.  RENAL/URINARY TRACT ULTRASOUND COMPLETE  Comparison:  None.  Findings:  Right Kidney:  12.3 cm. No hydronephrosis or renal mass.  Left Kidney:  12.3 cm. No hydronephrosis or renal mass.  Bladder:  Decompressed by Foley catheter.  IMPRESSION: Portable renal sonogram without abnormality noted.  Please note that ultrasound may not detect  pyelonephritis.   Original Report Authenticated By: Lacy Duverney, M.D.    Dg Chest Port 1 View  05/28/2012  *RADIOLOGY REPORT*  Clinical Data: Shortness of breath.  PORTABLE CHEST - 1 VIEW  Comparison: 05/26/2012  Findings: Bibasilar airspace opacities increased since prior study. Heart is upper limits normal in size.  Possible small effusions. Diffuse interstitial prominence throughout the lungs, stable.  IMPRESSION: Increasing bibasilar atelectasis or infiltrates.  Stable diffuse interstitial prominence.  Question small bilateral effusions.   Original Report Authenticated By: Charlett Nose, M.D.     Assessment:  1. Active Problems: 2.  Diabetes mellitus type 2 with neurological manifestations 3.  COPD (chronic obstructive pulmonary disease) 4.  Morbidly obese 5.  Sepsis(995.91) 6.  Pyelonephritis 7.  Encephalopathy acute 8.  Atrial fibrillation with rapid ventricular response 9.  Acute-on-chronic respiratory failure 10.   Plan:  1. Switch antiarrhythmics and anticoagulants to PO. Use Xarelto for easy transition, but she may prefer warfarin long term for cost reasons. Plan amiodarone for no more than 2-4 weeks at this point (unless arrhythmia recurs).  Time Spent Directly with Patient:  30 minutes  Length of Stay:  LOS: 3 days    Farley Crooker 05/29/2012, 9:45 AM

## 2012-05-29 NOTE — Evaluation (Signed)
Physical Therapy Evaluation Patient Details Name: Destiny Shelton MRN: [id number] DOB: [birth year] Today's Date: 05/29/2012 Time: 9528-4132 PT Time Calculation (min): 28 min  PT Assessment / Plan / Recommendation Clinical Impression  Pt s/p sepsis and afib with decr mobility secondary to decr endurance and decr balance.  Will benefit from PT to address endurance and balance deficits.      PT Assessment  Patient needs continued PT services    Follow Up Recommendations  Home health PT;Supervision - Intermittent        Barriers to Discharge Decreased caregiver support      Equipment Recommendations  None recommended by PT    Recommendations for Other Services   None  Frequency Min 3X/week    Precautions / Restrictions Precautions Precautions: Fall Restrictions Weight Bearing Restrictions: No   Pertinent Vitals/Pain Desat to 85% on 6LO2 - nursing aware; No pain      Mobility  Bed Mobility Bed Mobility: Rolling Left;Left Sidelying to Sit Rolling Left: 5: Supervision;With rail Left Sidelying to Sit: 4: Min assist;With rails;HOB elevated Details for Bed Mobility Assistance: cues for technique Transfers Transfers: Sit to Stand;Stand to Sit;Stand Pivot Transfers Sit to Stand: 4: Min assist;With upper extremity assist;With armrests;From chair/3-in-1;From bed Stand to Sit: 4: Min assist;With upper extremity assist;With armrests;To chair/3-in-1 Stand Pivot Transfers: 4: Min assist Details for Transfer Assistance: Pt sit to stand from bed with min assist bil UEs and pivoted with HHA around to potty chair.  Pt sit to stand from 3N1 needing steadying assist initially secondary to patient was unsteady on her feet.  Once patient held onto RW, pt gained balance and able to ambulate.   Ambulation/Gait Ambulation/Gait Assistance: 4: Min assist Ambulation Distance (Feet): 40 Feet Assistive device: Rolling walker Ambulation/Gait Assistance Details: Pt ambulated slowly with RW.  Needed  occasional assist for steering.  Cued for pursed lip breathing as well as patient desat x1 during her walk.   Gait Pattern: Step-through pattern;Decreased stride length;Wide base of support Gait velocity: decreased Stairs: No Wheelchair Mobility Wheelchair Mobility: No              PT Diagnosis: Generalized weakness  PT Problem List: Decreased activity tolerance;Decreased mobility;Decreased balance;Decreased knowledge of use of DME;Decreased safety awareness PT Treatment Interventions: DME instruction;Gait training;Functional mobility training;Therapeutic activities;Therapeutic exercise;Balance training;Patient/family education;Stair training   PT Goals Acute Rehab PT Goals PT Goal Formulation: With patient Time For Goal Achievement: 06/05/12 Potential to Achieve Goals: Good Pt will go Supine/Side to Sit: Independently PT Goal: Supine/Side to Sit - Progress: Goal set today Pt will go Sit to Stand: Independently PT Goal: Sit to Stand - Progress: Goal set today Pt will Ambulate: 16 - 50 feet;with modified independence;with least restrictive assistive device PT Goal: Ambulate - Progress: Goal set today Pt will Go Up / Down Stairs: 1-2 stairs;with supervision;with least restrictive assistive device PT Goal: Up/Down Stairs - Progress: Goal set today  Visit Information  Last PT Received On: 05/29/12 Assistance Needed: +2 (lines - 2 IV poles and O2)    Subjective Data  Subjective: "I feel so tired." "I need to use the bathroom" Patient Stated Goal: To go home   Prior Functioning  Home Living Lives With: Spouse Available Help at Discharge: Family;Available PRN/intermittently (husband works) Type of Home: House Home Access: Stairs to enter Secretary/administrator of Steps: 1 Entrance Stairs-Rails: None Home Layout: One level Bathroom Shower/Tub: Health visitor: Standard Home Adaptive Equipment: Bedside commode/3-in-1;Shower chair without back;Walker - four  wheeled;Straight cane (6LO2)  Additional Comments: Used cane for outdoors PTA; did not use device indoors PTA.   Prior Function Level of Independence: Independent Able to Take Stairs?: Yes Driving: No Vocation: Retired Musician: No difficulties    Cognition  Overall Cognitive Status: Appears within functional limits for tasks assessed/performed Arousal/Alertness: Awake/alert Orientation Level: Appears intact for tasks assessed Behavior During Session: [facility] for tasks performed    Extremity/Trunk Assessment Right Lower Extremity Assessment RLE ROM/Strength/Tone: [facility] for tasks assessed Left Lower Extremity Assessment LLE ROM/Strength/Tone: [facility] for tasks assessed Trunk Assessment Trunk Assessment: Normal   Balance Static Standing Balance Static Standing - Balance Support: Bilateral upper extremity supported;During functional activity Static Standing - Level of Assistance: 4: Min assist Static Standing - Comment/# of Minutes: min guard assist with RW for 1 minute to be cleaned of BM after using 3N1  End of Session PT - End of Session Equipment Utilized During Treatment: Gait belt;Oxygen Activity Tolerance: Patient limited by fatigue Patient left: in chair;with call bell/phone within reach;with family/visitor present Nurse Communication: Mobility status       Destiny Shelton 05/29/2012, 10:02 AM  Destiny Shelton Acute Rehabilitation [phone] (pager)

## 2012-05-29 NOTE — Progress Notes (Signed)
Subjective: Developed Afib with RVR (rates into 160s) yesterday.  Started on Cardizem drip, Amiodarone and Heparin drip.  Required Bipap briefly.  Received IV Lasix with net 7.3 Liter diuresis.  She reports that she feels much better this am though still mildly short of breath and fatigued.  Objective: Vital signs in last 24 hours: Temp:  [97.8 F (36.6 C)-98.4 F (36.9 C)] 97.8 F (36.6 C) (12/28 0753) Pulse Rate:  [42-172] 108  (12/28 0831) Resp:  [16-31] 21  (12/28 0831) BP: (108-162)/(51-117) 125/60 mmHg (12/28 0800) SpO2:  [84 %-98 %] 93 % (12/28 0831) FiO2 (%):  [40 %-50 %] 50 % (12/28 0800) Weight:  [106 kg (233 lb 11 oz)] 106 kg (233 lb 11 oz) (12/28 0500) Weight change: -4.5 kg (-9 lb 14.7 oz) Last BM Date: 05/28/12  CBG (last 3)   Basename 05/29/12 0752 05/29/12 0354 05/28/12 2338  GLUCAP 143* 131* 132*    Intake/Output from previous day: 12/27 0701 - 12/28 0700 In: 1750.2 [P.O.:360; I.V.:1277.7; IV Piggyback:112.5] Out: 8810 [Urine:8810] Intake/Output this shift: Total I/O In: 518.7 [P.O.:460; I.V.:46.2; IV Piggyback:12.5] Out: 120 [Urine:120]  General appearance: chronically ill with mild tachypnea Eyes: no scleral icterus Throat: oropharynx moist without erythema Resp: bilateral expiratory wheezing Cardio: tachycardic, regular (sinus tachycardia on monitor) GI: soft, non-tender; bowel sounds normal; no masses,  no organomegaly Extremities: no clubbing, cyanosis or edema  Lab Results:  [facility] 05/29/12 0528 05/28/12 1245 05/26/12 1829  NA 141 139 --  K 3.4* 3.2* --  CL 102 99 --  CO2 29 26 --  GLUCOSE 155* 239* --  BUN 7 7 --  CREATININE 0.59 0.58 --  CALCIUM 8.0* 8.4 --  MG -- 1.8 1.8  PHOS -- -- 4.1    Basename 05/29/12 0528 05/28/12 0404  AST 12 17  ALT 11 12  ALKPHOS 68 61  BILITOT 0.3 0.3  PROT 6.4 5.9*  ALBUMIN 2.4* 2.3*    Basename 05/29/12 0528 05/28/12 0404  WBC 15.4* 20.0*  NEUTROABS 12.1* 17.1*  HGB 8.6* 8.2*  HCT 30.1*  27.8*  MCV 71.8* 71.1*  PLT 216 192   Lab Results  Component Value Date   INR 1.21 05/26/2012   INR 0.96 12/19/2011   INR 1.0 09/06/2007    Basename 05/29/12 0040 05/28/12 1743 05/28/12 1245  CKTOTAL -- -- --  CKMB -- -- --  CKMBINDEX -- -- --  TROPONINI <0.30 <0.30 <0.30    Basename 05/26/12 1829  TSH 0.246*  T4TOTAL --  T3FREE --  THYROIDAB --   Blood Cultures negative X 2 to date Urine Cultures- E.coli sensitive to Zosyn, Cipro  Studies/Results: US Renal Port  05/27/2012  *RADIOLOGY REPORT*  Clinical Data: Hypertensive diabetic with fever and back pain. Question abscess.  RENAL/URINARY TRACT ULTRASOUND COMPLETE  Comparison:  None.  Findings:  Right Kidney:  12.3 cm. No hydronephrosis or renal mass.  Left Kidney:  12.3 cm. No hydronephrosis or renal mass.  Bladder:  Decompressed by Foley catheter.  IMPRESSION: Portable renal sonogram without abnormality noted.  Please note that ultrasound may not detect pyelonephritis.   Original Report Authenticated By: Lacy Duverney, M.D.    Dg Chest Port 1 View  05/28/2012  *RADIOLOGY REPORT*  Clinical Data: Shortness of breath.  PORTABLE CHEST - 1 VIEW  Comparison: 05/26/2012  Findings: Bibasilar airspace opacities increased since prior study. Heart is upper limits normal in size.  Possible small effusions. Diffuse interstitial prominence throughout the lungs, stable.  IMPRESSION: Increasing bibasilar atelectasis or  infiltrates.  Stable diffuse interstitial prominence.  Question small bilateral effusions.   Original Report Authenticated By: Charlett Nose, M.D.    Dg Abd Portable 1v  05/27/2012  *RADIOLOGY REPORT*  Clinical Data: Left-sided back pain.  Pyelonephritis.  PORTABLE ABDOMEN - 1 VIEW  Comparison: None.  Findings: Curvilinear subcentimeter calcification roughly overlying the lower pole of the right kidney could represent a renal calculus.  No definite left renal or ureteral calculi are seen. There is mild degenerative change in the  spine.  A rectal temperature probe is present.  There is no bowel obstruction.  Free air cannot be assessed on portable AP supine film.  IMPRESSION: No definite left renal calculi.  CT urogram more sensitive. Subcentimeter calcification on the right could be renal in origin. Correlate clinically.   Original Report Authenticated By: Davonna Belling, M.D.    EKG- Afib with RVR  Medications: Scheduled:   . antiseptic oral rinse  15 mL Mouth Rinse q12n4p  . atorvastatin  10 mg Oral q1800  . budesonide-formoterol  2 puff Inhalation BID  . docusate sodium  100 mg Oral BID  . DULoxetine  60 mg Oral Daily  . feeding supplement  1 Container Oral BID BM  . insulin aspart  0-15 Units Subcutaneous Q4H  . pantoprazole  40 mg Oral Daily  . piperacillin-tazobactam (ZOSYN)  IV  3.375 g Intravenous Q8H  . potassium chloride  40 mEq Oral BID  . potassium chloride  40 mEq Oral Once  . roflumilast  500 mcg Oral Daily  . sodium chloride  3 mL Intravenous Q12H  . tiotropium  18 mcg Inhalation Daily   Continuous:   . sodium chloride 20 mL/hr at 05/29/12 0700  . amiodarone (NEXTERONE PREMIX) 360 mg/200 mL dextrose 30 mg/hr (05/29/12 0700)  . diltiazem (CARDIZEM) infusion 15 mg/hr (05/29/12 0755)  . heparin 1,450 Units/hr (05/29/12 0834)    Assessment/Plan: Active Problems:  1. Sepsis (995.91) secondary to Acute Pyelonephritis- Urine culture reveals E.coli sensitive to Pip/Tazo and Cipro. Blood cultures pending. Will continue Zosyn for now and deescalate to Cipro prior to discharge if continued improvement.  2. Acute on Chronic Respiratory Failure- multifactorial secondary to underlying COPD, Afib with RVR and CHF due to diastolic dysfunction.  Improved with diuresis though she is still wheezing (chronic). Repeat CXR today.   3. Afib with RVR- she has converted to sinus tachycardia.  Continue treatment per Cards.  Will need long-term anticoagulation.  Defer choice to cardiologist (discussed options and rationale  including coumadin, newer anticoagulants with patient) 4. COPD (chronic obstructive pulmonary disease)- continue nebulizers. On 4L O2 at home. Continue symbicort, Daliresp, spiriva.  Change Albuterol to Xopenex due to Afib/tachycardia.  Will try to hold off on steroids if possible. 5. Encephalopathy acute- improved. Secondary to sepsis.  6. Diabetes mellitus type 2 with neurological manifestations- continue SSI. 7. Morbidly obese- will review prior sleep study to rule out OSA.  Probably some component of OHVS. 8. Anemia- stable.  Monitor- consider transfusion if decreases further.  Check anemia panel. 8. Disposition-  Will consider transfer to floor tomorrow if stable. Anticipate discharge in 3-4 days. PT/OT evaluation.    LOS: 3 days   Destiny Shelton,W DOUGLAS 05/29/2012, 8:55 AM

## 2012-05-29 NOTE — Progress Notes (Signed)
ANTICOAGULATION CONSULT NOTE - Follow Up Consult  Pharmacy Consult for heparin Indication: atrial fibrillation  Allergies  Allergen Reactions  . Aspirin Shortness Of Breath  . Nsaids Shortness Of Breath    Patient Measurements: Height: 5\' 5"  (165.1 cm) Weight: 243 lb 9.7 oz (110.5 kg) IBW/kg (Calculated) : 57  Heparin Dosing Weight: 83 kg  Vital Signs: Temp: 98.4 F (36.9 C) (12/28 0001) Temp src: Oral (12/28 0001) BP: 119/62 mmHg (12/28 0200) Pulse Rate: 108  (12/28 0200)  Labs:  Basename 05/29/12 0044 05/29/12 0040 05/28/12 1743 05/28/12 1245 05/28/12 0404 05/27/12 0410 05/26/12 1256 05/26/12 1219  HGB -- -- -- -- 8.2* 8.0* -- --  HCT -- -- -- -- 27.8* 27.4* -- 33.4*  PLT -- -- -- -- 192 233 -- 287  APTT -- -- -- -- -- -- 36 --  LABPROT -- -- -- -- -- -- 15.1 --  INR -- -- -- -- -- -- 1.21 --  HEPARINUNFRC <0.10* -- -- -- -- -- -- --  CREATININE -- -- -- 0.58 0.61 0.78 -- --  CKTOTAL -- -- -- -- -- -- -- --  CKMB -- -- -- -- -- -- -- --  TROPONINI -- <0.30 <0.30 <0.30 -- -- -- --    Estimated Creatinine Clearance: 84.5 ml/min (by C-G formula based on Cr of 0.58).   Medical History: Past Medical History  Diagnosis Date  . ALLERGIC RHINITIS   . Sleep apnea   . Hypertension   . Adenomatous colon polyp   . Gastritis   . External hemorrhoids   . Diverticulosis of colon   . Diabetes mellitus type 2 with neurological manifestations     On Insulin  . Depression   . Osteoporosis   . Morbidly obese   . Nephrolithiasis   . Back pain, chronic   . Osteoarthritis   . Anxiety   . Hyperlipemia   . Kidney stones     hx of   . Atrial tachycardia     Mostly Sinus Tachycardia  . Asthma   . Inappropriate sinus tachycardia   . COPD mixed type     Requiring Home O2 at 4L  . Shortness of breath   . GERD (gastroesophageal reflux disease)   . Headache     headaches  . Neuromuscular disorder     diabetic neuropathy  . Fibromyalgia     "pain in arms and shoulders."     Medications:  Prescriptions prior to admission  Medication Sig Dispense Refill  . albuterol (VENTOLIN HFA) 108 (90 BASE) MCG/ACT inhaler Inhale 2 puffs into the lungs every 6 (six) hours as needed. For shortness of breath      . alendronate (FOSAMAX) 70 MG tablet Take 70 mg by mouth every 7 (seven) days. Take with a full glass of water on an empty stomach. Takes on Thursday.      . ALPRAZolam (XANAX) 0.5 MG tablet Take 0.5-1 mg by mouth at bedtime as needed. For anxiety      . benzonatate (TESSALON) 200 MG capsule Take 200 mg by mouth 3 (three) times daily as needed. For cough      . budesonide-formoterol (SYMBICORT) 160-4.5 MCG/ACT inhaler Inhale 2 puffs into the lungs 2 (two) times daily.        Marland Kitchen CALCIUM-VITAMIN D PO Take 1 tablet by mouth 2 (two) times daily.       . cyclobenzaprine (FLEXERIL) 10 MG tablet Take 10 mg by mouth at bedtime.       Marland Kitchen  docusate sodium (COLACE) 100 MG capsule Take 100 mg by mouth 2 (two) times daily.        . DULoxetine (CYMBALTA) 60 MG capsule Take 60 mg by mouth daily.        . furosemide (LASIX) 20 MG tablet Take 20 mg by mouth daily.       Marland Kitchen gabapentin (NEURONTIN) 600 MG tablet Take 600 mg by mouth 3 (three) times daily as needed. For pain      . hydrocodone-acetaminophen (LORCET-HD) 5-500 MG per capsule Take 1 capsule by mouth every 8 (eight) hours as needed. For pain      . HYDROcodone-homatropine (HYCODAN) 5-1.5 MG/5ML syrup Take 5 mLs by mouth at bedtime as needed. For cough      . insulin aspart (NOVOLOG) 100 UNIT/ML injection Inject 10 Units into the skin 2 (two) times daily.       . insulin glargine (LANTUS) 100 UNIT/ML injection Inject 84 Units into the skin daily before breakfast.       . metFORMIN (GLUCOPHAGE) 500 MG tablet Take 1,000 mg by mouth 2 (two) times daily with a meal. Re-start as previously ordered 48 hours post cath      . Multiple Vitamin (MULTIVITAMIN) tablet Take 1 tablet by mouth daily.        . Omega-3 Fatty Acids (FISH OIL) 1000  MG CAPS Take 1 capsule by mouth daily.        Marland Kitchen omeprazole (PRILOSEC) 20 MG capsule Take 20 mg by mouth daily.        . potassium chloride (KLOR-CON) 10 MEQ CR tablet Take 20-40 mEq by mouth 2 (two) times daily. Takes in AM and in PM      . pravastatin (PRAVACHOL) 40 MG tablet Take 80 mg by mouth at bedtime.       . roflumilast (DALIRESP) 500 MCG TABS tablet Take 500 mcg by mouth daily.        Marland Kitchen tiotropium (SPIRIVA) 18 MCG inhalation capsule Place 18 mcg into inhaler and inhale daily.        . verapamil (VERELAN PM) 360 MG 24 hr capsule Take 360 mg by mouth daily.        Assessment: 66 yo lady on heparin therapy for afib. Heparin level (< 0.1) is below-goal on 1100 units/hr. No problem with line / infusion and no bleeding per RN.   Goal of Therapy:  Heparin level 0.3-0.7 units/ml Monitor platelets by anticoagulation protocol: Yes   Plan:  1. Heparin IV bolus of 2000 units x 1, then increase IV infusion to 1450 units/hr.  2. Heparin level in 6 hours.   Emeline Gins 05/29/2012,2:22 AM

## 2012-05-30 LAB — CBC WITH DIFFERENTIAL/PLATELET
Basophils Absolute: 0 10*3/uL (ref 0.0–0.1)
Eosinophils Relative: 2 % (ref 0–5)
HCT: 28.6 % — ABNORMAL LOW (ref 36.0–46.0)
Hemoglobin: 8.3 g/dL — ABNORMAL LOW (ref 12.0–15.0)
Lymphocytes Relative: 17 % (ref 12–46)
MCHC: 29 g/dL — ABNORMAL LOW (ref 30.0–36.0)
MCV: 71.1 fL — ABNORMAL LOW (ref 78.0–100.0)
Monocytes Absolute: 1.1 10*3/uL — ABNORMAL HIGH (ref 0.1–1.0)
Monocytes Relative: 9 % (ref 3–12)
Neutro Abs: 8.5 10*3/uL — ABNORMAL HIGH (ref 1.7–7.7)
RDW: 19.6 % — ABNORMAL HIGH (ref 11.5–15.5)
WBC: 11.9 10*3/uL — ABNORMAL HIGH (ref 4.0–10.5)

## 2012-05-30 LAB — GLUCOSE, CAPILLARY
Glucose-Capillary: 116 mg/dL — ABNORMAL HIGH (ref 70–99)
Glucose-Capillary: 142 mg/dL — ABNORMAL HIGH (ref 70–99)
Glucose-Capillary: 142 mg/dL — ABNORMAL HIGH (ref 70–99)
Glucose-Capillary: 183 mg/dL — ABNORMAL HIGH (ref 70–99)
Glucose-Capillary: 187 mg/dL — ABNORMAL HIGH (ref 70–99)
Glucose-Capillary: 193 mg/dL — ABNORMAL HIGH (ref 70–99)

## 2012-05-30 LAB — COMPREHENSIVE METABOLIC PANEL
ALT: 11 U/L (ref 0–35)
Albumin: 2.4 g/dL — ABNORMAL LOW (ref 3.5–5.2)
Alkaline Phosphatase: 65 U/L (ref 39–117)
Calcium: 8.7 mg/dL (ref 8.4–10.5)
GFR calc Af Amer: >90 mL/min (ref >90)
Glucose, Bld: 129 mg/dL — ABNORMAL HIGH (ref 70–99)
Potassium: 3.6 mEq/L (ref 3.5–5.1)
Sodium: 138 mEq/L (ref 135–145)
Total Protein: 6.3 g/dL (ref 6.0–8.3)

## 2012-05-30 MED ORDER — LEVALBUTEROL HCL 0.63 MG/3ML IN NEBU
0.6300 mg | INHALATION_SOLUTION | RESPIRATORY_TRACT | Status: DC | PRN
  Administered 2012-05-30: 0.63 mg via RESPIRATORY_TRACT
  Filled 2012-05-30: qty 3

## 2012-05-30 MED ORDER — POTASSIUM CHLORIDE CRYS ER 20 MEQ PO TBCR
40.0000 meq | EXTENDED_RELEASE_TABLET | Freq: Once | ORAL | Status: AC
  Administered 2012-05-30: 40 meq via ORAL
  Filled 2012-05-30: qty 2

## 2012-05-30 MED ORDER — FUROSEMIDE 10 MG/ML IJ SOLN
40.0000 mg | Freq: Two times a day (BID) | INTRAMUSCULAR | Status: DC
  Administered 2012-05-30 – 2012-05-31 (×4): 40 mg via INTRAVENOUS
  Filled 2012-05-30 (×7): qty 4

## 2012-05-30 MED ORDER — POLYSACCHARIDE IRON COMPLEX 150 MG PO CAPS
150.0000 mg | ORAL_CAPSULE | Freq: Every day | ORAL | Status: DC
  Administered 2012-05-30 – 2012-05-31 (×2): 150 mg via ORAL
  Filled 2012-05-30 (×3): qty 1

## 2012-05-30 MED ORDER — AMIODARONE IV BOLUS ONLY 150 MG/100ML: 150.0000 mg | Freq: Once | INTRAVENOUS | Status: DC

## 2012-05-30 MED ORDER — AMIODARONE IV BOLUS ONLY 150 MG/100ML
150.0000 mg | INTRAVENOUS | Status: AC
  Administered 2012-05-30: 150 mg via INTRAVENOUS
  Filled 2012-05-30: qty 100

## 2012-05-30 NOTE — Progress Notes (Addendum)
[facility]  Subjective: Dyspnea worsened about 30 minutes ago when afib started.  Objective: Vital signs in last 24 hours: Temp:  [98.3 F (36.8 C)-98.7 F (37.1 C)] 98.3 F (36.8 C) (12/29 0710) Pulse Rate:  [97-124] 120  (12/29 0908) Resp:  [17-28] 22  (12/29 0908) BP: (100-130)/(43-67) 130/58 mmHg (12/29 0900) SpO2:  [90 %-97 %] 96 % (12/29 0908) Weight:  [106.3 kg (234 lb 5.6 oz)] 106.3 kg (234 lb 5.6 oz) (12/29 0400) Last BM Date: 05/29/12  Intake/Output from previous day: 12/28 0701 - 12/29 0700 In: 1738.3 [P.O.:1420; I.V.:155.8; IV Piggyback:162.5] Out: 1371 [Urine:1370; Stool:1] Intake/Output this shift: Total I/O In: 132.5 [P.O.:120; IV Piggyback:12.5] Out: 240 [Urine:240]  Medications Current Facility-Administered Medications  Medication Dose Route Frequency Provider Last Rate Last Dose  . acetaminophen (TYLENOL) tablet 650 mg  650 mg Oral Q6H PRN Zigmund Gottron, MD   650 mg at 05/29/12 2259  . amiodarone (PACERONE) tablet 400 mg  400 mg Oral BID Thurmon Fair, MD   400 mg at 05/30/12 0934  . antiseptic oral rinse (BIOTENE) solution 15 mL  15 mL Mouth Rinse q12n4p Konstantin Zubelevitskiy, MD   15 mL at 05/28/12 1600  . atorvastatin (LIPITOR) tablet 10 mg  10 mg Oral q1800 Kari Baars, MD   10 mg at 05/29/12 1706  . benzonatate (TESSALON) capsule 200 mg  200 mg Oral TID PRN Ezequiel Kayser, MD   200 mg at 05/30/12 0052  . budesonide-formoterol (SYMBICORT) 160-4.5 MCG/ACT inhaler 2 puff  2 puff Inhalation BID Roxine Caddy, MD   2 puff at 05/30/12 0747  . docusate sodium (COLACE) capsule 100 mg  100 mg Oral BID Ezequiel Kayser, MD   100 mg at 05/27/12 2159  . DULoxetine (CYMBALTA) DR capsule 60 mg  60 mg Oral Daily Ezequiel Kayser, MD   60 mg at 05/30/12 0933  . feeding supplement (RESOURCE BREEZE) liquid 1 Container  1 Container Oral BID BM Ailene Ards, RD   1 Container at 05/29/12 [phone]  . insulin aspart (novoLOG)  injection 0-15 Units  0-15 Units Subcutaneous Q4H Lonia Farber, MD   2 Units at 05/30/12 0727  . levalbuterol (XOPENEX) nebulizer solution 0.63 mg  0.63 mg Nebulization Q6H PRN Kari Baars, MD   0.63 mg at 05/30/12 0514  . pantoprazole (PROTONIX) EC tablet 40 mg  40 mg Oral Daily Ezequiel Kayser, MD   40 mg at 05/30/12 0934  . piperacillin-tazobactam (ZOSYN) IVPB 3.375 g  3.375 g Intravenous Q8H W Buren Kos, MD   3.375 g at 05/30/12 0528  . Rivaroxaban (XARELTO) tablet 20 mg  20 mg Oral Q supper Lauren Bajbus, PHARMD   20 mg at 05/29/12 1706  . roflumilast (DALIRESP) tablet 500 mcg  500 mcg Oral Daily Ezequiel Kayser, MD   500 mcg at 05/30/12 0934  . sodium chloride 0.9 % injection 3 mL  3 mL Intravenous Q12H Ezequiel Kayser, MD   3 mL at 05/30/12 0934  . tiotropium (SPIRIVA) inhalation capsule 18 mcg  18 mcg Inhalation Daily Roxine Caddy, MD   18 mcg at 05/30/12 0745  . verapamil (CALAN-SR) CR tablet 360 mg  360 mg Oral Daily Mihai Croitoru, MD   360 mg at 05/30/12 0934    PE: General appearance: alert, cooperative and mild distress  No JVD; thick neck Lungs: bilateral wheeze. Heart: irregularly irregular rhythm and No MM Extremities: Trace LEE Pulses: 2+  and symmetric Skin: Warm and dry Neurologic: Grossly normal  Lab Results:   Basename 05/30/12 0425 05/29/12 0528 05/28/12 0404  WBC 11.9* 15.4* 20.0*  HGB 8.3* 8.6* 8.2*  HCT 28.6* 30.1* 27.8*  PLT 230 216 192   BMET  Basename 05/30/12 0425 05/29/12 0528 05/28/12 1245  NA 138 141 139  K 3.6 3.4* 3.2*  CL 101 102 99  CO2 30 29 26   GLUCOSE 129* 155* 239*  BUN 9 7 7   CREATININE 0.67 0.59 0.58  CALCIUM 8.7 8.0* 8.4    Assessment/Plan   Active Problems:  Diabetes mellitus type 2 with neurological manifestations  COPD (chronic obstructive pulmonary disease)  Morbidly obese  Sepsis(995.91)  Pyelonephritis  Encephalopathy acute  Atrial fibrillation with rapid ventricular response  Acute-on-chronic respiratory  failure  Plan:  The patient converted back to afib with RVR.  Will rebolus with amiodarone.   Plan to continue PO amio for 2-4 weeks.   Currently on Xarelto.   LOS: 4 days    HAGER, BRYAN 05/30/2012 9:37 AM   Patient seen and examined. Agree with assessment and plan.Converted to sinus rhythm with IV amidarone bolus, now ST at 115. Dyspnea improved. I recurrent burst of AF may need addition of short term low dose cardioselective beta-blocker.  Lennette Bihari, MD, [facility] 05/30/2012 10:29 AM

## 2012-05-30 NOTE — Progress Notes (Signed)
Pt SOB with trying to eat, back in a fib, sats down on 6L [place], placed back on VM 50%, will continue to monitor HR and BP

## 2012-05-30 NOTE — Plan of Care (Signed)
Problem: Problem: Cardiovascular Progression Goal: NO ARRHYTHMIAS Outcome: Not Progressing RAF again this am requiring amiodarone IV bolus

## 2012-05-30 NOTE — Progress Notes (Signed)
Subjective: Was doing well until she had another episode of Afib with RVR accompanied by increased shortness of breath, WOB-->NRB.  Received IV Amio bolus and converted to Sinus Tachycardia with improvement.  Currently no additional complaints.  Objective: Vital signs in last 24 hours: Temp:  [98.3 F (36.8 C)-98.7 F (37.1 C)] 98.3 F (36.8 C) (12/29 0710) Pulse Rate:  [97-147] 118  (12/29 1000) Resp:  [17-28] 26  (12/29 1000) BP: (100-130)/(43-67) 128/63 mmHg (12/29 1000) SpO2:  [90 %-97 %] 94 % (12/29 1000) Weight:  [106.3 kg (234 lb 5.6 oz)] 106.3 kg (234 lb 5.6 oz) (12/29 0400) Weight change: 0.3 kg (10.6 oz) Last BM Date: 05/29/12  CBG (last 3)   Basename 05/30/12 0709 05/29/12 2355 05/29/12 1937  GLUCAP 142* 142* 185*    Intake/Output from previous day: 12/28 0701 - 12/29 0700 In: 1738.3 [P.O.:1420; I.V.:155.8; IV Piggyback:162.5] Out: 1371 [Urine:1370; Stool:1] Intake/Output this shift: Total I/O In: 215.5 [P.O.:120; IV Piggyback:95.5] Out: 331 [Urine:330; Stool:1]  General appearance: alert and mild tachypnea Eyes: no scleral icterus Throat: oropharynx moist without erythema Resp: bilateral wheezing Cardio: regular rate and rhythm and tachycardia GI: soft, non-tender; bowel sounds normal; no masses,  no organomegaly Extremities: no clubbing, cyanosis; trace edema   Lab Results:  Basename 05/30/12 0425 05/29/12 0528 05/28/12 1245  NA 138 141 --  K 3.6 3.4* --  CL 101 102 --  CO2 30 29 --  GLUCOSE 129* 155* --  BUN 9 7 --  CREATININE 0.67 0.59 --  CALCIUM 8.7 8.0* --  MG -- -- 1.8  PHOS -- -- --    Basename 05/30/12 0425 05/29/12 0528  AST 12 12  ALT 11 11  ALKPHOS 65 68  BILITOT 0.3 0.3  PROT 6.3 6.4  ALBUMIN 2.4* 2.4*    Basename 05/30/12 0425 05/29/12 0528  WBC 11.9* 15.4*  NEUTROABS 8.5* 12.1*  HGB 8.3* 8.6*  HCT 28.6* 30.1*  MCV 71.1* 71.8*  PLT 230 216   Lab Results  Component Value Date   INR 1.21 05/26/2012   INR 0.96  12/19/2011   INR 1.0 09/06/2007    Basename 05/29/12 0040 05/28/12 1743 05/28/12 1245  CKTOTAL -- -- --  CKMB -- -- --  CKMBINDEX -- -- --  TROPONINI <0.30 <0.30 <0.30   No results found for this basename: TSH,T4TOTAL,FREET3,T3FREE,THYROIDAB in the last 72 hours  Basename 05/29/12 1231 05/29/12 0906  VITAMINB12 -- 776  FOLATE -- >20.0  FERRITIN -- 32  TIBC -- 282  IRON -- 10*  RETICCTPCT 1.6 --    Studies/Results: Dg Chest Port 1 View  05/29/2012  *RADIOLOGY REPORT*  Clinical Data: Dyspnea.  Hypertension.  Diabetes.  COPD.  PORTABLE CHEST - 1 VIEW  Comparison: 05/28/2012  Findings: Patient rotated to the right.  Mild cardiomegaly with atherosclerosis in the transverse aorta.  Possible small left pleural effusion. No pneumothorax.  Diffuse interstitial prominence is moderate and similar to on the prior exam.  Mild thickening of the right minor fissure.  Left greater than right bibasilar airspace disease is similar.  IMPRESSION:  1. No significant change since one day prior. 2.  Bibasilar atelectasis or infection with small possible left pleural effusion. 3.  Interstitial thickening which is primarily felt to be due to chronic bronchitis/smoking.  Mild superimposed pulmonary venous congestion is difficult to exclude.   Original Report Authenticated By: Jeronimo Greaves, M.D.    Dg Chest Port 1 View  05/28/2012  *RADIOLOGY REPORT*  Clinical Data: Shortness of breath.  PORTABLE CHEST - 1 VIEW  Comparison: 05/26/2012  Findings: Bibasilar airspace opacities increased since prior study. Heart is upper limits normal in size.  Possible small effusions. Diffuse interstitial prominence throughout the lungs, stable.  IMPRESSION: Increasing bibasilar atelectasis or infiltrates.  Stable diffuse interstitial prominence.  Question small bilateral effusions.   Original Report Authenticated By: Charlett Nose, M.D.      Medications: Scheduled:   . amiodarone  400 mg Oral BID  . antiseptic oral rinse  15 mL  Mouth Rinse q12n4p  . atorvastatin  10 mg Oral q1800  . budesonide-formoterol  2 puff Inhalation BID  . docusate sodium  100 mg Oral BID  . DULoxetine  60 mg Oral Daily  . feeding supplement  1 Container Oral BID BM  . insulin aspart  0-15 Units Subcutaneous Q4H  . pantoprazole  40 mg Oral Daily  . piperacillin-tazobactam (ZOSYN)  IV  3.375 g Intravenous Q8H  . rivaroxaban  20 mg Oral Q supper  . roflumilast  500 mcg Oral Daily  . sodium chloride  3 mL Intravenous Q12H  . tiotropium  18 mcg Inhalation Daily  . verapamil  360 mg Oral Daily   Continuous:   Assessment/Plan: Active Problems:  1. Sepsis (995.91) secondary to Acute Pyelonephritis- Urine culture reveals E.coli sensitive to Pip/Tazo and Cipro. Blood cultures negative to date. Will continue Zosyn for now and deescalate to Cipro prior to discharge to complete 14 day course 2. Acute on Chronic Respiratory Failure- multifactorial secondary to underlying COPD, Afib with RVR and CHF due to diastolic dysfunction. Improved with diuresis though she is still wheezing (chronic). Will continue Lasix 40mg  IV BID and continue bronchodilators. 3. Afib with RVR- recurrent bouts of Afib are not tolerated well.  Continue treatment per Cards with Verapamil for rate control, Amiodarone and Xeralto. 4. COPD (chronic obstructive pulmonary disease)- continue nebulizers. On 4L O2 at home. Continue symbicort, Daliresp, spiriva. Change Xopenex to q4 hours prn. Will try to hold off on steroids if possible.  5. Encephalopathy acute- improved. Secondary to sepsis.  6. Diabetes mellitus type 2 with neurological manifestations- continue SSI.  7. Morbidly obese- will review prior sleep study to rule out OSA. Probably some component of OHVS.  8. Anemia- stable. Monitor- consider transfusion if decreases further. Add oral Iron. 8. Disposition- Will consider transfer to floor tomorrow if stable. Anticipate discharge in 3-4 days. PT/OT evaluation.     LOS: 4 days    Destiny Shelton,Destiny Shelton 05/30/2012, 10:31 AM

## 2012-05-31 LAB — GLUCOSE, CAPILLARY
Glucose-Capillary: 140 mg/dL — ABNORMAL HIGH (ref 70–99)
Glucose-Capillary: 142 mg/dL — ABNORMAL HIGH (ref 70–99)
Glucose-Capillary: 244 mg/dL — ABNORMAL HIGH (ref 70–99)
Glucose-Capillary: 215 mg/dL — ABNORMAL HIGH (ref 70–99)

## 2012-05-31 LAB — CBC WITH DIFFERENTIAL/PLATELET
Basophils Relative: 0 % (ref 0–1)
Eosinophils Absolute: 0.4 10*3/uL (ref 0.0–0.7)
Eosinophils Relative: 3 % (ref 0–5)
HCT: 30.4 % — ABNORMAL LOW (ref 36.0–46.0)
Hemoglobin: 9 g/dL — ABNORMAL LOW (ref 12.0–15.0)
Lymphocytes Relative: 25 % (ref 12–46)
MCV: 70 fL — ABNORMAL LOW (ref 78.0–100.0)
Monocytes Relative: 4 % (ref 3–12)
Myelocytes: 1 %
RBC: 4.34 MIL/uL (ref 3.87–5.11)
RDW: 19.4 % — ABNORMAL HIGH (ref 11.5–15.5)
WBC: 13.5 10*3/uL — ABNORMAL HIGH (ref 4.0–10.5)

## 2012-05-31 LAB — COMPREHENSIVE METABOLIC PANEL
Albumin: 2.5 g/dL — ABNORMAL LOW (ref 3.5–5.2)
BUN: 11 mg/dL (ref 6–23)
Calcium: 9.3 mg/dL (ref 8.4–10.5)
Creatinine, Ser: 0.7 mg/dL (ref 0.50–1.10)
GFR calc Af Amer: >90 mL/min (ref >90)
Glucose, Bld: 143 mg/dL — ABNORMAL HIGH (ref 70–99)
Total Protein: 6.6 g/dL (ref 6.0–8.3)

## 2012-05-31 MED ORDER — NYSTATIN 100000 UNIT/ML MT SUSP
5.0000 mL | Freq: Four times a day (QID) | OROMUCOSAL | Status: DC
  Administered 2012-05-31 (×3): 500000 [IU] via ORAL
  Filled 2012-05-31 (×9): qty 5

## 2012-05-31 MED ORDER — POTASSIUM CHLORIDE CRYS ER 20 MEQ PO TBCR
40.0000 meq | EXTENDED_RELEASE_TABLET | Freq: Two times a day (BID) | ORAL | Status: AC
  Administered 2012-05-31 (×2): 40 meq via ORAL
  Filled 2012-05-31 (×2): qty 2

## 2012-05-31 NOTE — Progress Notes (Signed)
[facility]  Subjective: Doing much better.  Rested well last night.  Objective: Vital signs in last 24 hours: Temp:  [97.9 F (36.6 C)-98.3 F (36.8 C)] 97.9 F (36.6 C) (12/30 1107) Pulse Rate:  [98-117] 112  (12/30 1107) Resp:  [17-27] 20  (12/30 1107) BP: (86-142)/(41-71) 130/69 mmHg (12/30 1107) SpO2:  [90 %-97 %] 97 % (12/30 1107) Weight:  [102 kg (224 lb 13.9 oz)] 102 kg (224 lb 13.9 oz) (12/30 0600) Last BM Date: 05/30/12  Intake/Output from previous day: 12/29 0701 - 12/30 0700 In: 1100.5 [P.O.:840; IV Piggyback:260.5] Out: 5561 [Urine:5560; Stool:1] Intake/Output this shift: Total I/O In: 389 [P.O.:360; IV Piggyback:29] Out: 900 [Urine:900]  Medications Current Facility-Administered Medications  Medication Dose Route Frequency Provider Last Rate Last Dose  . acetaminophen (TYLENOL) tablet 650 mg  650 mg Oral Q6H PRN Zigmund Gottron, MD   650 mg at 05/29/12 2259  . amiodarone (PACERONE) tablet 400 mg  400 mg Oral BID Che Below, MD   400 mg at 05/31/12 1003  . antiseptic oral rinse (BIOTENE) solution 15 mL  15 mL Mouth Rinse q12n4p Konstantin Zubelevitskiy, MD   15 mL at 05/28/12 1600  . atorvastatin (LIPITOR) tablet 10 mg  10 mg Oral q1800 Kari Baars, MD   10 mg at 05/30/12 1709  . benzonatate (TESSALON) capsule 200 mg  200 mg Oral TID PRN Ezequiel Kayser, MD   200 mg at 05/30/12 0052  . budesonide-formoterol (SYMBICORT) 160-4.5 MCG/ACT inhaler 2 puff  2 puff Inhalation BID Roxine Caddy, MD   2 puff at 05/31/12 [phone]  . docusate sodium (COLACE) capsule 100 mg  100 mg Oral BID Ezequiel Kayser, MD   100 mg at 05/27/12 2159  . DULoxetine (CYMBALTA) DR capsule 60 mg  60 mg Oral Daily Ezequiel Kayser, MD   60 mg at 05/31/12 1003  . feeding supplement (RESOURCE BREEZE) liquid 1 Container  1 Container Oral BID BM Ailene Ards, RD   1 Container at 05/31/12 1010  . furosemide (LASIX) injection 40 mg  40 mg Intravenous BID Kari Baars, MD   40 mg at 05/31/12 1001  . insulin aspart (novoLOG) injection 0-15 Units  0-15 Units Subcutaneous Q4H Lonia Farber, MD   2 Units at 05/31/12 [phone]  . iron polysaccharides (NIFEREX) capsule 150 mg  150 mg Oral Daily W Buren Kos, MD   150 mg at 05/31/12 1004  . levalbuterol (XOPENEX) nebulizer solution 0.63 mg  0.63 mg Nebulization Q4H PRN Kari Baars, MD   0.63 mg at 05/30/12 1118  . nystatin (MYCOSTATIN) 100000 UNIT/ML suspension 500,000 Units  5 mL Oral QID Kari Baars, MD      . pantoprazole (PROTONIX) EC tablet 40 mg  40 mg Oral Daily Ezequiel Kayser, MD   40 mg at 05/31/12 1010  . piperacillin-tazobactam (ZOSYN) IVPB 3.375 g  3.375 g Intravenous Q8H W Buren Kos, MD   3.375 g at 05/31/12 0555  . potassium chloride SA (K-DUR,KLOR-CON) CR tablet 40 mEq  40 mEq Oral BID Kari Baars, MD   40 mEq at 05/31/12 0855  . Rivaroxaban (XARELTO) tablet 20 mg  20 mg Oral Q supper Lauren Bajbus, PHARMD   20 mg at 05/30/12 1709  . roflumilast (DALIRESP) tablet 500 mcg  500 mcg Oral Daily Ezequiel Kayser, MD   500 mcg at 05/31/12 1004  . sodium chloride 0.9 % injection 3 mL  3 mL Intravenous Q12H Ezequiel Kayser, MD   3 mL at 05/31/12 1010  . tiotropium (SPIRIVA) inhalation capsule 18 mcg  18 mcg Inhalation Daily Roxine Caddy, MD   18 mcg at 05/31/12 0840  . verapamil (CALAN-SR) CR tablet 360 mg  360 mg Oral Daily Elektra Wartman, MD   360 mg at 05/31/12 1003    PE: General appearance: alert, cooperative and no distress Lungs: clear to auscultation bilaterally Heart:  Rate elevated and regular rhythm and No MM Extremities: No LEE Pulses: 2+ and symmetric Skin: Warm and dry Neurologic: Grossly normal  Lab Results:   Basename 05/31/12 0450 05/30/12 0425 05/29/12 0528  WBC 13.5* 11.9* 15.4*  HGB 9.0* 8.3* 8.6*  HCT 30.4* 28.6* 30.1*  PLT 272 230 216   BMET  Basename 05/31/12 0450 05/30/12 0425 05/29/12 0528  NA 139 138 141  K 3.3* 3.6 3.4*  CL 99 101 102  CO2  30 30 29   GLUCOSE 143* 129* 155*  BUN 11 9 7   CREATININE 0.70 0.67 0.59  CALCIUM 9.3 8.7 8.0*   Assessment/Plan  Active Problems:  Diabetes mellitus type 2 with neurological manifestations  COPD (chronic obstructive pulmonary disease)  Morbidly obese  Sepsis(995.91)  Pyelonephritis  Encephalopathy acute  Atrial fibrillation with rapid ventricular response  Acute-on-chronic respiratory failure  Plan:  S/P  Additional 150mg  amiodarone bolus yesterday.  PO amio 400mg  BID.  Would continue for one week and change to daily.  Reassess in 30 days.  Long-term use probably not a good idea with COPD.  Now maintaining sinus tachycardia in the 110's  Which is pretty much baseline. BP stable.  Net Fluids - last 24 hours.  On Xarelto, IV Lasix, verapamil.   Check BNP in AM.   LOS: 5 days    HAGER, BRYAN 05/31/2012 11:19 AM  I have seen and examined the patient along with HAGER, BRYAN, PA.  I have reviewed the chart, notes and new data.  I agree with PA's note.  Key new complaints: "much better" Key examination changes: walked with PT/ walker; a few wheezes Key new findings / data: maintaining sinus (tachy) rhythm.  Plan: PO amiodarone for at least 30 days. Full anticoagulation for at least several months (if not life-long).  Thurmon Fair, MD, [facility] [phone] 05/31/2012, 1:17 PM

## 2012-05-31 NOTE — Progress Notes (Signed)
Patient refusing CPAP at this time. RT will continue to monitor.  

## 2012-05-31 NOTE — Progress Notes (Signed)
[facility]  Patient Status: New  [facility] is providing the following services: RN and PT  If patient discharges after hours, please call [phone].   Destiny Shelton 05/31/2012, 1:49 PM

## 2012-05-31 NOTE — Evaluation (Signed)
Occupational Therapy Evaluation Patient Details Name: Destiny Shelton MRN: [id number] DOB: 08-24-1945 Today's Date: 05/31/2012 Time: 5784-6962 OT Time Calculation (min): 36 min  OT Assessment / Plan / Recommendation Clinical Impression  This 66 y.o. female admitted with sepsis and A-Fib and h/o COPD with 02 dependency.  pt. demonstrates the below listed deficits and will benefit from OT to maximize safety and independence with BADLs.  Pt. with limited activty tolerance/endurance and will need 24 hours assist at discharge.  She assusres therapist that she will have this level of care at d/c    OT Assessment  Patient needs continued OT Services    Follow Up Recommendations  Home health OT;Supervision/Assistance - 24 hour    Barriers to Discharge None    Equipment Recommendations  None recommended by OT    Recommendations for Other Services    Frequency  Min 2X/week    Precautions / Restrictions Precautions Precautions: Fall;Other (comment) (02) Restrictions Weight Bearing Restrictions: No       ADL  Eating/Feeding: Independent Where Assessed - Eating/Feeding: Chair Grooming: Wash/dry hands;Wash/dry face;Teeth care;Brushing hair;Set up Where Assessed - Grooming: Supported sitting Upper Body Bathing: Moderate assistance Where Assessed - Upper Body Bathing: Supported sitting Lower Body Bathing: Moderate assistance Where Assessed - Lower Body Bathing: Supported sit to stand Upper Body Dressing: Moderate assistance Where Assessed - Upper Body Dressing: Unsupported sitting Lower Body Dressing: Moderate assistance Where Assessed - Lower Body Dressing: Supported sit to stand Toilet Transfer: Minimal assistance Toilet Transfer Method: Sit to stand;Stand pivot Toilet Transfer Equipment: Raised toilet seat with arms (or 3-in-1 over toilet) Toileting - Clothing Manipulation and Hygiene: Moderate assistance Where Assessed - Toileting Clothing Manipulation and Hygiene:  Standing Equipment Used: Rolling walker;Gait belt Transfers/Ambulation Related to ADLs: Pt ambulates with min A ADL Comments: Pt able to don/doff socks EOB.  She fatigues rapidly with dyspnea 3/4 and O2 sats decreasing to 88% with activity.  Pt moves very slowly and ADLs limited due to cardiopulmonary status    OT Diagnosis: Generalized weakness  OT Problem List: Decreased strength;Decreased activity tolerance;Impaired balance (sitting and/or standing);Cardiopulmonary status limiting activity OT Treatment Interventions: Self-care/ADL training;DME and/or AE instruction;Energy conservation;Patient/family education;Balance training   OT Goals Acute Rehab OT Goals OT Goal Formulation: With patient Time For Goal Achievement: 06/14/12 Potential to Achieve Goals: Good ADL Goals Pt Will Perform Grooming: with supervision;Standing at sink ADL Goal: Grooming - Progress: Goal set today Pt Will Perform Upper Body Bathing: with supervision;Sitting, edge of bed;Sitting, chair ADL Goal: Upper Body Bathing - Progress: Goal set today Pt Will Perform Lower Body Bathing: with min assist;Sit to stand from chair;Sit to stand from bed ADL Goal: Lower Body Bathing - Progress: Goal set today Pt Will Perform Upper Body Dressing: with set-up;with supervision;Sitting, chair;Sitting, bed ADL Goal: Upper Body Dressing - Progress: Goal set today Pt Will Perform Lower Body Dressing: with supervision;Sit to stand from chair;Sit to stand from bed ADL Goal: Lower Body Dressing - Progress: Goal set today Pt Will Transfer to Toilet: with supervision;Ambulation;with DME;Comfort height toilet ADL Goal: Toilet Transfer - Progress: Goal set today Pt Will Perform Toileting - Clothing Manipulation: with supervision;Standing ADL Goal: Toileting - Clothing Manipulation - Progress: Goal set today Additional ADL Goal #1: Pt will participate in 25 mins therapeutic activity to increase endurance needed for BADLs  Visit Information   Last OT Received On: 05/31/12 Assistance Needed: +1    Subjective Data  Subjective: "I think I can manage with my husband and daughters helping"  re: going home at discharge Patient Stated Goal: To go home, and regain strength   Prior Functioning     Home Living Lives With: Spouse Available Help at Discharge: Family;Available PRN/intermittently Type of Home: House Home Access: Stairs to enter [employer] of Steps: 1 Entrance Stairs-Rails: None Home Layout: One level Bathroom Shower/Tub: Walk-in shower;Tub/shower unit Bathroom Toilet: Standard Home Adaptive Equipment: Bedside commode/3-in-1;Shower chair without back;Walker - four wheeled;Straight cane Additional Comments: Used cane for outdoors PTA; did not use device indoors PTA.   Prior Function Level of Independence: Independent;Needs assistance Needs Assistance: Light Housekeeping Light Housekeeping: Moderate Able to Take Stairs?: Yes Comments: Pt reports she has been attending pulmonary rehab on and off since 7/13 Communication Communication: No difficulties Dominant Hand: Right         Vision/Perception     Cognition  Overall Cognitive Status: Appears within functional limits for tasks assessed/performed Arousal/Alertness: Awake/alert Orientation Level: Appears intact for tasks assessed Behavior During Session: [facility] for tasks performed    Extremity/Trunk Assessment Right Upper Extremity Assessment RUE ROM/Strength/Tone: Within functional levels RUE Coordination: WFL - gross/fine motor Left Upper Extremity Assessment LUE ROM/Strength/Tone: Within functional levels LUE Coordination: WFL - gross/fine motor Trunk Assessment Trunk Assessment: Normal     Mobility Bed Mobility Bed Mobility: Supine to Sit;Sitting - Scoot to Edge of Bed Supine to Sit: 5: Supervision Sitting - Scoot to [place] of Bed: 5: Supervision Transfers Transfers: Sit to Stand;Stand to Sit Sit to Stand: 4: Min assist;With upper  extremity assist;From bed Stand to Sit: 4: Min guard;With upper extremity assist;To chair/3-in-1;With armrests Details for Transfer Assistance: Very minimal A for stand.  cues for use of UEs, to get clsoer to chair prior to sitting, and to control descent to chair.       Shoulder Instructions     Exercise     Balance Balance Balance Assessed: Yes Dynamic Sitting Balance Dynamic Sitting - Balance Support: Feet supported;No upper extremity supported Dynamic Sitting - Level of Assistance: 5: Stand by assistance Dynamic Sitting - Balance Activities: Other (comment) (LB ADL) Static Standing Balance Static Standing - Balance Support: Bilateral upper extremity supported;During functional activity   End of Session OT - End of Session Equipment Utilized During Treatment: Gait belt Activity Tolerance: Patient limited by fatigue Patient left: in chair;with call bell/phone within reach Nurse Communication: Mobility status  GO     Jaquetta Currier M 05/31/2012, 2:38 PM

## 2012-05-31 NOTE — Progress Notes (Signed)
Subjective: She feels better.  No additional episodes since yesterday morning.  Shortness of breath is improved.   Objective: Vital signs in last 24 hours: Temp:  [97.7 F (36.5 C)-98.3 F (36.8 C)] 97.9 F (36.6 C) (12/30 0400) Pulse Rate:  [98-147] 107  (12/30 0600) Resp:  [17-27] 17  (12/30 0600) BP: (86-142)/(43-71) 86/67 mmHg (12/30 0600) SpO2:  [90 %-98 %] 93 % (12/30 0600) Weight:  [102 kg (224 lb 13.9 oz)] 102 kg (224 lb 13.9 oz) (12/30 0600) Weight change: -4.3 kg (-9 lb 7.7 oz) Last BM Date: 05/30/12  CBG (last 3)   Basename 05/30/12 2358 05/30/12 1933 05/30/12 1531  GLUCAP 156* 187* 183*    Intake/Output from previous day: 12/29 0701 - 12/30 0700 In: 1088 [P.O.:840; IV Piggyback:248] Out: 5561 [Urine:5560; Stool:1] Intake/Output this shift:    General appearance: alert, no distress and chronically ill Eyes: no scleral icterus Throat: oropharynx moist without erythema Resp: minimal bilateral expiratory wheezing, improved Cardio: tachycardic, regular GI: soft, non-tender; bowel sounds normal; no masses,  no organomegaly Extremities: no clubbing, cyanosis; trace edema in extremities  Lab Results:  Basename 05/31/12 0450 05/30/12 0425 05/28/12 1245  NA 139 138 --  K 3.3* 3.6 --  CL 99 101 --  CO2 30 30 --  GLUCOSE 143* 129* --  BUN 11 9 --  CREATININE 0.70 0.67 --  CALCIUM 9.3 8.7 --  MG -- -- 1.8  PHOS -- -- --    [facility] 05/31/12 0450 05/30/12 0425  AST 13 12  ALT 11 11  ALKPHOS 59 65  BILITOT 0.4 0.3  PROT 6.6 6.3  ALBUMIN 2.5* 2.4*    Basename 05/31/12 0450 05/30/12 0425  WBC 13.5* 11.9*  NEUTROABS 9.2* 8.5*  HGB 9.0* 8.3*  HCT 30.4* 28.6*  MCV 70.0* 71.1*  PLT 272 230   Lab Results  Component Value Date   INR 1.21 05/26/2012   INR 0.96 12/19/2011   INR 1.0 09/06/2007    Basename 05/29/12 0040 05/28/12 1743 05/28/12 1245  CKTOTAL -- -- --  CKMB -- -- --  CKMBINDEX -- -- --  TROPONINI <0.30 <0.30 <0.30    Basename 05/29/12  1231 05/29/12 0906  VITAMINB12 -- 776  FOLATE -- >20.0  FERRITIN -- 32  TIBC -- 282  IRON -- 10*  RETICCTPCT 1.6 --    Studies/Results: Dg Chest Port 1 View  05/29/2012  *RADIOLOGY REPORT*  Clinical Data: Dyspnea.  Hypertension.  Diabetes.  COPD.  PORTABLE CHEST - 1 VIEW  Comparison: 05/28/2012  Findings: Patient rotated to the right.  Mild cardiomegaly with atherosclerosis in the transverse aorta.  Possible small left pleural effusion. No pneumothorax.  Diffuse interstitial prominence is moderate and similar to on the prior exam.  Mild thickening of the right minor fissure.  Left greater than right bibasilar airspace disease is similar.  IMPRESSION:  1. No significant change since one day prior. 2.  Bibasilar atelectasis or infection with small possible left pleural effusion. 3.  Interstitial thickening which is primarily felt to be due to chronic bronchitis/smoking.  Mild superimposed pulmonary venous congestion is difficult to exclude.   Original Report Authenticated By: Jeronimo Greaves, M.D.      Medications: Scheduled:   . amiodarone  400 mg Oral BID  . antiseptic oral rinse  15 mL Mouth Rinse q12n4p  . atorvastatin  10 mg Oral q1800  . budesonide-formoterol  2 puff Inhalation BID  . docusate sodium  100 mg Oral BID  . DULoxetine  60 mg Oral Daily  . feeding supplement  1 Container Oral BID BM  . furosemide  40 mg Intravenous BID  . insulin aspart  0-15 Units Subcutaneous Q4H  . iron polysaccharides  150 mg Oral Daily  . pantoprazole  40 mg Oral Daily  . piperacillin-tazobactam (ZOSYN)  IV  3.375 g Intravenous Q8H  . rivaroxaban  20 mg Oral Q supper  . roflumilast  500 mcg Oral Daily  . sodium chloride  3 mL Intravenous Q12H  . tiotropium  18 mcg Inhalation Daily  . verapamil  360 mg Oral Daily   Continuous:   Assessment/Plan: Active Problems:  1. Sepsis (995.91) secondary to Acute Pyelonephritis- Urine culture reveals E.coli sensitive to Pip/Tazo and Cipro. Blood cultures  negative to date. Will transition to Cipro prior to discharge to complete 14 day course  2. Acute on Chronic Respiratory Failure- multifactorial secondary to underlying COPD, Afib with RVR and CHF due to diastolic dysfunction. Improving with diuresis though she is still wheezing (chronic). Will continue Lasix 40mg  IV BID and continue bronchodilators.  3. Afib with RVR- recurrent bouts of Afib are not tolerated well. Continue treatment per Cards with Verapamil for rate control, Amiodarone and Xeralto.  4. COPD (chronic obstructive pulmonary disease)- continue nebulizers. On 4L O2 at home. Continue symbicort, Daliresp, spiriva. Change Xopenex to q4 hours prn.  5. Encephalopathy acute- improved. Secondary to sepsis.  6. Diabetes mellitus type 2 with neurological manifestations- continue SSI.  7. Morbidly obese- will review prior sleep study to rule out OSA. Probably some component of OHVS.  8. Anemia- stable. Monitor-stable.  Continue Iron.  8. Disposition- Transfer to telemetry bed.  Anticipate discharge in 2-3 days. PT/OT evaluation.     LOS: 5 days   Gurshan Settlemire,W DOUGLAS 05/31/2012, 7:08 AM

## 2012-05-31 NOTE — Progress Notes (Signed)
Physical Therapy Treatment Patient Details Name: Destiny Shelton MRN: [id number] DOB: 07-31-1945 Today's Date: 05/31/2012 Time: 8295-6213 PT Time Calculation (min): 36 min  PT Assessment / Plan / Recommendation Comments on Treatment Session  pt presents with Resp Failure and Sepsis.  pt making improvements today, but continues to be limited by SOB and fatigue.      Follow Up Recommendations  Home health PT;Supervision - Intermittent     Does the patient have the potential to tolerate intense rehabilitation     Barriers to Discharge        Equipment Recommendations  None recommended by PT    Recommendations for Other Services    Frequency Min 3X/week   Plan Discharge plan remains appropriate;Frequency remains appropriate    Precautions / Restrictions Precautions Precautions: Fall Restrictions Weight Bearing Restrictions: No   Pertinent Vitals/Pain Denies pain.  O2 sats decrease to 88% on 6 L after ambulating.  Returned to 91-92% on 6L O2 after seated rest break.      Mobility  Bed Mobility Bed Mobility: Supine to Sit;Sitting - Scoot to Edge of Bed Supine to Sit: 5: Supervision;With rails Sitting - Scoot to Edge of Bed: 5: Supervision Transfers Transfers: Sit to Stand;Stand to Sit Sit to Stand: 4: Min assist;With upper extremity assist;From bed Stand to Sit: 4: Min guard;With upper extremity assist;To chair/3-in-1;With armrests Details for Transfer Assistance: Very minimal A for stand.  cues for use of UEs, to get clsoer to chair prior to sitting, and to control descent to chair.   Ambulation/Gait Ambulation/Gait Assistance: 4: Min guard Ambulation Distance (Feet): 80 Feet Assistive device: Rolling walker Ambulation/Gait Assistance Details: pt very fatigued with mobility and needed 2 standing rest breaks.  pt on 6L O2 with sats decreasing to 88% after ambulating.  pt demos good pursed lip breathing.   Gait Pattern: Step-through pattern;Decreased stride length;Wide base  of support Stairs: No Wheelchair Mobility Wheelchair Mobility: No    Exercises     PT Diagnosis:    PT Problem List:   PT Treatment Interventions:     PT Goals Acute Rehab PT Goals Time For Goal Achievement: 06/05/12 Potential to Achieve Goals: Good PT Goal: Supine/Side to Sit - Progress: Progressing toward goal PT Goal: Sit to Stand - Progress: Progressing toward goal PT Goal: Ambulate - Progress: Progressing toward goal  Visit Information  Last PT Received On: 05/31/12 Assistance Needed: +1 PT/OT Co-Evaluation/Treatment: Yes    Subjective Data  Subjective: I just feel weak.     Cognition  Overall Cognitive Status: Appears within functional limits for tasks assessed/performed Arousal/Alertness: Awake/alert Orientation Level: Appears intact for tasks assessed Behavior During Session: [facility] for tasks performed    Balance  Balance Balance Assessed: No  End of Session PT - End of Session Equipment Utilized During Treatment: Gait belt;Oxygen Activity Tolerance: Patient limited by fatigue Patient left: in chair;with call bell/phone within reach Nurse Communication: Mobility status   GP     Sunny Schlein, [place] [phone] 05/31/2012, 2:15 PM

## 2012-06-01 ENCOUNTER — Encounter (HOSPITAL_COMMUNITY): Payer: Medicare Other

## 2012-06-01 LAB — CULTURE, BLOOD (ROUTINE X 2)
Culture: NO GROWTH
Culture: NO GROWTH

## 2012-06-01 LAB — PRO B NATRIURETIC PEPTIDE: Pro B Natriuretic peptide (BNP): 615.7 pg/mL — ABNORMAL HIGH (ref 0–125)

## 2012-06-01 LAB — COMPREHENSIVE METABOLIC PANEL
ALT: 13 U/L (ref 0–35)
Alkaline Phosphatase: 66 U/L (ref 39–117)
BUN: 15 mg/dL (ref 6–23)
CO2: 29 mEq/L (ref 19–32)
Chloride: 98 mEq/L (ref 96–112)
GFR calc Af Amer: >90 mL/min (ref >90)
Glucose, Bld: 183 mg/dL — ABNORMAL HIGH (ref 70–99)
Potassium: 3.7 mEq/L (ref 3.5–5.1)
Sodium: 140 mEq/L (ref 135–145)
Total Bilirubin: 0.4 mg/dL (ref 0.3–1.2)

## 2012-06-01 LAB — CBC WITH DIFFERENTIAL/PLATELET
Basophils Absolute: 0.1 10*3/uL (ref 0.0–0.1)
Eosinophils Absolute: 0.3 10*3/uL (ref 0.0–0.7)
HCT: 33.4 % — ABNORMAL LOW (ref 36.0–46.0)
Lymphocytes Relative: 19 % (ref 12–46)
Lymphs Abs: 2.6 10*3/uL (ref 0.7–4.0)
MCHC: 29.6 g/dL — ABNORMAL LOW (ref 30.0–36.0)
Monocytes Relative: 11 % (ref 3–12)
Neutro Abs: 9.2 10*3/uL — ABNORMAL HIGH (ref 1.7–7.7)
Platelets: 322 10*3/uL (ref 150–400)
RDW: 19.4 % — ABNORMAL HIGH (ref 11.5–15.5)
WBC: 13.7 10*3/uL — ABNORMAL HIGH (ref 4.0–10.5)

## 2012-06-01 MED ORDER — CIPROFLOXACIN HCL 500 MG PO TABS
500.0000 mg | ORAL_TABLET | Freq: Two times a day (BID) | ORAL | Status: DC
  Filled 2012-06-01 (×3): qty 1

## 2012-06-01 MED ORDER — INSULIN GLARGINE 100 UNIT/ML ~~LOC~~ SOLN: 70.0000 [IU] | Freq: Every day | SUBCUTANEOUS | Status: DC

## 2012-06-01 MED ORDER — POLYSACCHARIDE IRON COMPLEX 150 MG PO CAPS: 150.0000 mg | ORAL_CAPSULE | Freq: Every day | ORAL | Status: DC

## 2012-06-01 MED ORDER — LEVALBUTEROL TARTRATE 45 MCG/ACT IN AERO: 1.0000 | INHALATION_SPRAY | RESPIRATORY_TRACT | Status: AC | PRN

## 2012-06-01 MED ORDER — FUROSEMIDE 20 MG PO TABS: 40.0000 mg | ORAL_TABLET | Freq: Two times a day (BID) | ORAL | Status: DC

## 2012-06-01 MED ORDER — FUROSEMIDE 40 MG PO TABS
40.0000 mg | ORAL_TABLET | Freq: Two times a day (BID) | ORAL | Status: DC
  Filled 2012-06-01 (×3): qty 1

## 2012-06-01 MED ORDER — AMIODARONE HCL 400 MG PO TABS: 400.0000 mg | ORAL_TABLET | Freq: Two times a day (BID) | ORAL | Status: DC

## 2012-06-01 MED ORDER — NYSTATIN 100000 UNIT/ML MT SUSP: 5.0000 mL | Freq: Four times a day (QID) | OROMUCOSAL | Status: DC

## 2012-06-01 MED ORDER — CIPROFLOXACIN HCL 500 MG PO TABS: 500.0000 mg | ORAL_TABLET | Freq: Two times a day (BID) | ORAL | Status: AC

## 2012-06-01 MED ORDER — RIVAROXABAN 20 MG PO TABS: 20.0000 mg | ORAL_TABLET | Freq: Every day | ORAL | Status: DC

## 2012-06-01 NOTE — Discharge Summary (Signed)
DISCHARGE SUMMARY  Destiny Shelton  MR#: [id number]  DOB:07-Sep-1945  Date of Admission: 05/26/2012 Date of Discharge: 06/01/2012  Attending Physician:Toyoko Silos,W DOUGLAS  Patient's WUJ:WJXB,J Riley Lam, MD  Consults: Cardiology [facility])                   Pulmonary Critical Care Medicine  Discharge Diagnoses: Active Problems:  Sepsis(995.91) secondary to Pyelonephritis  Atrial fibrillation with rapid ventricular response  Acute-on-chronic respiratory failure  Diabetes mellitus type 2 with neurological manifestations  COPD (chronic obstructive pulmonary disease)  Morbidly obese  Encephalopathy acute, resolved    Past Medical History  Diagnosis Date  . ALLERGIC RHINITIS   . Sleep apnea   . Hypertension   . Adenomatous colon polyp   . Gastritis   . External hemorrhoids   . Diverticulosis of colon   . Diabetes mellitus type 2 with neurological manifestations     On Insulin  . Depression   . Osteoporosis   . Morbidly obese   . Nephrolithiasis   . Back pain, chronic   . Osteoarthritis   . Anxiety   . Hyperlipemia   . Kidney stones     hx of   . Atrial tachycardia     Mostly Sinus Tachycardia  . Asthma   . Inappropriate sinus tachycardia   . COPD mixed type     Requiring Home O2 at 4L  . Shortness of breath   . GERD (gastroesophageal reflux disease)   . Headache     headaches  . Neuromuscular disorder     diabetic neuropathy  . Fibromyalgia     "pain in arms and shoulders."    Past Surgical History  Procedure Date  . Appendectomy 1963  . Cholecystectomy 1963  . Abdominal hysterectomy 1978  . Total knee arthroplasty 1997    right  . Upper gastrointestinal endoscopy 04/09/2006    w/Dilation, gastritis  . Colonoscopy w/ biopsies and polypectomy 07/22/2007, 04/28/2007    2009: diverticulosis, 2008: diverticulosis, adenomatous polyps  . Tee without cardioversion 03/24/2012    Procedure: TRANSESOPHAGEAL ECHOCARDIOGRAM (TEE);  Surgeon: Chrystie Nose, MD;   Location: [facility] OR;  Service: Cardiovascular;  Laterality: N/A;  TEE with Bubble    Discharge Medications:   Medication List     As of 06/01/2012  7:34 AM    STOP taking these medications         digoxin 0.125 MG tablet   Commonly known as: LANOXIN      HYDROcodone-homatropine 5-1.5 MG/5ML syrup   Commonly known as: HYCODAN      metFORMIN 500 MG tablet   Commonly known as: GLUCOPHAGE      VENTOLIN HFA 108 (90 BASE) MCG/ACT inhaler   Generic drug: albuterol      TAKE these medications         alendronate 70 MG tablet   Commonly known as: FOSAMAX   Take 70 mg by mouth every 7 (seven) days. Take with a full glass of water on an empty stomach.  Takes on Thursday.      ALPRAZolam 0.5 MG tablet   Commonly known as: XANAX   Take 0.5-1 mg by mouth at bedtime as needed. For anxiety      amiodarone 400 MG tablet   Commonly known as: PACERONE   Take 1 tablet (400 mg total) by mouth 2 (two) times daily. Take 1 pill twice a day X 7 days, then 1 pill daily      benzonatate 200 MG capsule   Commonly  known as: TESSALON   Take 200 mg by mouth 3 (three) times daily as needed. For cough      budesonide-formoterol 160-4.5 MCG/ACT inhaler   Commonly known as: SYMBICORT   Inhale 2 puffs into the lungs 2 (two) times daily.      CALCIUM-VITAMIN D PO   Take 1 tablet by mouth 2 (two) times daily.      ciprofloxacin 500 MG tablet   Commonly known as: CIPRO   Take 1 tablet (500 mg total) by mouth 2 (two) times daily.      cyclobenzaprine 10 MG tablet   Commonly known as: FLEXERIL   Take 10 mg by mouth at bedtime.      DALIRESP 500 MCG Tabs tablet   Generic drug: roflumilast   Take 500 mcg by mouth daily.      docusate sodium 100 MG capsule   Commonly known as: COLACE   Take 100 mg by mouth 2 (two) times daily.      DULoxetine 60 MG capsule   Commonly known as: CYMBALTA   Take 60 mg by mouth daily.      Fish Oil 1000 MG Caps   Take 1 capsule by mouth daily.      furosemide  20 MG tablet   Commonly known as: LASIX   Take 2 tablets (40 mg total) by mouth 2 (two) times daily.      gabapentin 600 MG tablet   Commonly known as: NEURONTIN   Take 600 mg by mouth 3 (three) times daily as needed. For pain      hydrocodone-acetaminophen 5-500 MG per capsule   Commonly known as: LORCET-HD   Take 1 capsule by mouth every 8 (eight) hours as needed. For pain      insulin glargine 100 UNIT/ML injection   Commonly known as: LANTUS   Inject 70 Units into the skin daily before breakfast.      iron polysaccharides 150 MG capsule   Commonly known as: NIFEREX   Take 1 capsule (150 mg total) by mouth daily.      levalbuterol 45 MCG/ACT inhaler   Commonly known as: XOPENEX HFA   Inhale 1-2 puffs into the lungs every 4 (four) hours as needed for wheezing.      multivitamin tablet   Take 1 tablet by mouth daily.      NOVOLOG 100 UNIT/ML injection   Generic drug: insulin aspart   Inject 10 Units into the skin 2 (two) times daily.      nystatin 100000 UNIT/ML suspension   Commonly known as: MYCOSTATIN   Take 5 mLs (500,000 Units total) by mouth 4 (four) times daily.      omeprazole 20 MG capsule   Commonly known as: PRILOSEC   Take 20 mg by mouth daily.      potassium chloride 10 MEQ CR tablet   Commonly known as: KLOR-CON   Take 20-40 mEq by mouth 2 (two) times daily. Takes in AM and in PM      pravastatin 40 MG tablet   Commonly known as: PRAVACHOL   Take 80 mg by mouth at bedtime.      Rivaroxaban 20 MG Tabs   Commonly known as: XARELTO   Take 1 tablet (20 mg total) by mouth daily with supper.      tiotropium 18 MCG inhalation capsule   Commonly known as: SPIRIVA   Place 18 mcg into inhaler and inhale daily.      verapamil 360  MG 24 hr capsule   Commonly known as: VERELAN PM   Take 360 mg by mouth daily.        Hospital Procedures: US Renal Port  05/27/2012  *RADIOLOGY REPORT*  Clinical Data: Hypertensive diabetic with fever and back  pain. Question abscess.  RENAL/URINARY TRACT ULTRASOUND COMPLETE  Comparison:  None.  Findings:  Right Kidney:  12.3 cm. No hydronephrosis or renal mass.  Left Kidney:  12.3 cm. No hydronephrosis or renal mass.  Bladder:  Decompressed by Foley catheter.  IMPRESSION: Portable renal sonogram without abnormality noted.  Please note that ultrasound may not detect pyelonephritis.   Original Report Authenticated By: Lacy Duverney, M.D.    Dg Chest Port 1 View  05/29/2012  *RADIOLOGY REPORT*  Clinical Data: Dyspnea.  Hypertension.  Diabetes.  COPD.  PORTABLE CHEST - 1 VIEW  Comparison: 05/28/2012  Findings: Patient rotated to the right.  Mild cardiomegaly with atherosclerosis in the transverse aorta.  Possible small left pleural effusion. No pneumothorax.  Diffuse interstitial prominence is moderate and similar to on the prior exam.  Mild thickening of the right minor fissure.  Left greater than right bibasilar airspace disease is similar.  IMPRESSION:  1. No significant change since one day prior. 2.  Bibasilar atelectasis or infection with small possible left pleural effusion. 3.  Interstitial thickening which is primarily felt to be due to chronic bronchitis/smoking.  Mild superimposed pulmonary venous congestion is difficult to exclude.   Original Report Authenticated By: Jeronimo Greaves, M.D.    Dg Chest Port 1 View  05/28/2012  *RADIOLOGY REPORT*  Clinical Data: Shortness of breath.  PORTABLE CHEST - 1 VIEW  Comparison: 05/26/2012  Findings: Bibasilar airspace opacities increased since prior study. Heart is upper limits normal in size.  Possible small effusions. Diffuse interstitial prominence throughout the lungs, stable.  IMPRESSION: Increasing bibasilar atelectasis or infiltrates.  Stable diffuse interstitial prominence.  Question small bilateral effusions.   Original Report Authenticated By: Charlett Nose, M.D.    Dg Chest Port 1 View  05/26/2012  *RADIOLOGY REPORT*  Clinical Data: Cough, fever, shortness of  breath  PORTABLE CHEST - 1 VIEW  Comparison: 12/11/2011  Findings: Bibasilar scarring or atelectasis persists.  Heart size is normal.  No pleural effusion.  Cardiac leads overlie the chest. No acute osseous finding.  IMPRESSION: No acute cardiopulmonary process.  Bibasilar persistent scarring or atelectasis.   Original Report Authenticated By: Christiana Pellant, M.D.    Dg Abd Portable 1v  05/27/2012  *RADIOLOGY REPORT*  Clinical Data: Left-sided back pain.  Pyelonephritis.  PORTABLE ABDOMEN - 1 VIEW  Comparison: None.  Findings: Curvilinear subcentimeter calcification roughly overlying the lower pole of the right kidney could represent a renal calculus.  No definite left renal or ureteral calculi are seen. There is mild degenerative change in the spine.  A rectal temperature probe is present.  There is no bowel obstruction.  Free air cannot be assessed on portable AP supine film.  IMPRESSION: No definite left renal calculi.  CT urogram more sensitive. Subcentimeter calcification on the right could be renal in origin. Correlate clinically.   Original Report Authenticated By: Davonna Belling, M.D.     History of Present Illness: Chief Complaint: HPI: Hisayo is a pleasant 66 yo female with many severe medical problems. She was seen for her physical at our office 2-3 weeks ago. NO medicine changes were made then. She has felt poorly for the last 2-3 days. She had felt weak with some increased sob. Some mild  inc. In cough. She does report some hesitancy of urination and some mild back pain. This morning she was acting more confused and had chills and EMS was called. She denies any blood above or below. In the ER she is found to be hypotensive with evidence of a uti. She will therefore be admitted with the diagnosis of urosepsis.    Hospital Course: Ms. Don was admitted to a step down bed. She was treated empirically with Zosyn for sepsis secondary to urinary tract infection. Urine culture subsequently grew  Escherichia coli sensitive to both Zosyn and Cipro. She did develop transient hypotension requiring a brief course of Levophed. However, with volume resuscitation her hypotension resolved.  The patient appeared to be doing significantly better on 12/27 when she became acutely dyspneic in the setting of new-onset atrial fibrillation with rapid ventricular response. Critical care and cardiology evaluated the patient.Initial attempts at rate control with Cardizem drip were unsuccessful. She was loaded with IV amiodarone and subsequently converted to sinus tachycardia. She was anticoagulated initially with heparin drip and transitioned to Xarelto for long-term anticoagulation given her increased CHADS-2 score of 3. She had an additional episode of A. fib with RVR with similar associated respiratory distress. However, with return to sinus tachycardia her symptoms improved. Cardiology is recommended continued amiodarone 400 mg twice a day for one week, then 40 mg daily for the next month. We'll need to consider whether to continue this long term with her underlying lung disease.  In addition, she was aggressively diuresed with improvement in her respiratory status. Her weight is down over 10 kg since admission and her breathing is approaching baseline. She does require home oxygen.  At this point, the patient feels that she is stable for discharge home. She'll be referred to home health physical therapy occupational therapy with close outpatient followup with cardiology and primary care. She'll be continued on a higher dose of Lasix (40 mg by mouth twice a day) and will complete a 14 day course of antibiotics with Cipro.  Day of Discharge Exam BP 138/72  Pulse 109  Temp 98.3 F (36.8 C) (Oral)  Resp 20  Ht 5\' 5"  (1.651 m)  Wt 102 kg (224 lb 13.9 oz)  BMI 37.42 kg/m2  SpO2 94%  Physical Exam: General appearance: chronically ill, approaching baseline Eyes: no scleral icterus Throat: oropharynx moist  without erythema Resp: remarkably clear for her with resolution of wheezing Cardio: tachycardic, regular GI: soft, non-tender; bowel sounds normal; no masses,  no organomegaly Extremities: no clubbing, cyanosis or edema  Discharge Labs:  [facility] 06/01/12 0500 05/31/12 0450  NA 140 139  K 3.7 3.3*  CL 98 99  CO2 29 30  GLUCOSE 183* 143*  BUN 15 11  CREATININE 0.68 0.70  CALCIUM 10.0 9.3  MG -- --  PHOS -- --    Basename 06/01/12 0500 05/31/12 0450  AST 14 13  ALT 13 11  ALKPHOS 66 59  BILITOT 0.4 0.4  PROT 7.4 6.6  ALBUMIN 2.8* 2.5*    Basename 06/01/12 0500 05/31/12 0450  WBC 13.7* 13.5*  NEUTROABS 9.2* 9.2*  HGB 9.9* 9.0*  HCT 33.4* 30.4*  MCV 69.3* 70.0*  PLT 322 272   Lab Results  Component Value Date   INR 1.21 05/26/2012   INR 0.96 12/19/2011   INR 1.0 09/06/2007    Basename 05/29/12 1231 05/29/12 0906  VITAMINB12 -- 776  FOLATE -- >20.0  FERRITIN -- 32  TIBC -- 282  IRON -- 10*  RETICCTPCT 1.6 --   Troponin Negative X 3 ProBNP- 615.7 (255.6 on admission)  Discharge instructions:     Discharge Orders    Future Appointments: Provider: Department: Dept Phone: Center:   06/01/2012 10:30 AM [facility] Rehab [facility] CARDIAC REHAB [phone] None   06/03/2012 10:30 AM [facility]-Pulmonary Rehab [facility] CARDIAC REHAB [phone] None   06/08/2012 10:30 AM Mc-Pulmonary Rehab [facility] CARDIAC REHAB [phone] None   06/08/2012 1:45 PM Barbaraann Share, MD [facility] [phone] None   06/10/2012 10:30 AM [facility] Rehab [facility] CARDIAC REHAB [phone] None   06/15/2012 10:30 AM Mc-Pulmonary Rehab [facility] CARDIAC REHAB [phone] None   06/17/2012 10:30 AM [facility] Re[facility] CARDIAC REHAB [phone] None   06/22/2012 10:30 AM Mc-Pulmonary Rehab [facility] CARDIAC REHAB [phone] None   06/24/2012 9:30 AM Barbaraann Share, MD [facility] [phone] None   06/24/2012 10:30 AM [facility]Pulmonary Rehab [facility] CARDIAC REHAB [phone] None   06/29/2012 10:30 AM Mc-Pulmonary Rehab [facility] CARDIAC REHAB [phone] None   07/01/2012 10:30 AM Mc-Pulmonary Rehab [facility] CARDIAC REHAB [phone] None   07/06/2012 10:30 AM Mc-Pulmonary Rehab [facility] CARDIAC REHAB [phone] None   07/08/2012 10:30 AM Mc-Pulmonary Rehab [facility] CARDIAC REHAB [phone] None   07/13/2012 10:30 AM Mc-Pulmonary Rehab [facility] CARDIAC REHAB [phone] None   07/15/2012 10:30 AM Mc-Pulmonary Rehab [facility] CARDIAC REHAB [phone] None   07/20/2012 10:30 AM Mc-Pulmonary Rehab [facility] CARDIAC REHAB [phone] None   07/22/2012 10:30 AM Mc-Pulmonary Rehab [facility] CARDIAC REHAB [phone] None   07/27/2012 10:30 AM Mc-Pulmonary Rehab [facility] CARDIAC REHAB [phone] None   07/29/2012 10:30 AM Mc-Pulmonary Rehab [facility] CARDIAC REHAB [phone] None     Future Orders Please Complete By Expires   Diet - low sodium heart healthy      Home Health      Questions: Responses:   To provide the following care/treatments PT    OT    RN    Respiratory Care   Face-to-face encounter      Comments:   I Audry Pecina,W DOUGLAS certify that this patient is under my care and that I, or a nurse practitioner or physician's assistant working with me, had a face-to-face encounter that meets the physician face-to-face encounter requirements with this patient on 06/01/2012. The encounter with the patient was in whole, or in part for  the following medical condition(s) which is the primary reason for home health care (List medical condition): Acute respiratory failure   Questions: Responses:   The encounter with the patient was in whole, or in part, for the following medical condition, which is the primary reason for home health care Sepsis, Acute respiratory failure   I certify that, based on my findings, the following services are medically necessary home health services Physical therapy   My clinical findings support the need for the above services Unable to leave home safely without assistance and/or assistive device   Further, I certify that my clinical findings support that this patient is homebound due to: Unable to leave home safely without assistance   To provide the following care/treatments PT    OT  RN    Respiratory Care   Increase activity slowly      Discharge instructions      Comments:   Weigh daily and call if weight increases by more than 5lbs from baseline.  Call if increased shortness of breath, heart racing, or wheezing.  Decrease Lantus to 70 units and increase by 1 unit per day until fasting sugar below 120.  Call if sugars are below 70.      Disposition: to home with HHPT, OT, RN, O2  Follow-up Appts: Follow-up with Dr. Clelia Croft at [facility] in 1 week.  Condition on Discharge: stable  Tests Needing Follow-up: none  Time with Discharge Activities- 45 minutes  Signed: Adelbert Gaspard,W DOUGLAS 06/01/2012, 7:34 AM

## 2012-06-01 NOTE — Progress Notes (Signed)
Discharged to home with family office visits in place teaching done  

## 2012-06-02 DIAGNOSIS — A419 Sepsis, unspecified organism: Secondary | ICD-10-CM | POA: Diagnosis not present

## 2012-06-02 DIAGNOSIS — F411 Generalized anxiety disorder: Secondary | ICD-10-CM | POA: Diagnosis not present

## 2012-06-02 DIAGNOSIS — E1142 Type 2 diabetes mellitus with diabetic polyneuropathy: Secondary | ICD-10-CM | POA: Diagnosis not present

## 2012-06-02 DIAGNOSIS — E1149 Type 2 diabetes mellitus with other diabetic neurological complication: Secondary | ICD-10-CM | POA: Diagnosis not present

## 2012-06-02 DIAGNOSIS — N12 Tubulo-interstitial nephritis, not specified as acute or chronic: Secondary | ICD-10-CM | POA: Diagnosis not present

## 2012-06-02 DIAGNOSIS — J449 Chronic obstructive pulmonary disease, unspecified: Secondary | ICD-10-CM | POA: Diagnosis not present

## 2012-06-03 ENCOUNTER — Encounter (HOSPITAL_COMMUNITY): Payer: Medicare Other

## 2012-06-03 DIAGNOSIS — F411 Generalized anxiety disorder: Secondary | ICD-10-CM | POA: Diagnosis not present

## 2012-06-03 DIAGNOSIS — A419 Sepsis, unspecified organism: Secondary | ICD-10-CM | POA: Diagnosis not present

## 2012-06-03 DIAGNOSIS — E1142 Type 2 diabetes mellitus with diabetic polyneuropathy: Secondary | ICD-10-CM | POA: Diagnosis not present

## 2012-06-03 DIAGNOSIS — N12 Tubulo-interstitial nephritis, not specified as acute or chronic: Secondary | ICD-10-CM | POA: Diagnosis not present

## 2012-06-03 DIAGNOSIS — E1149 Type 2 diabetes mellitus with other diabetic neurological complication: Secondary | ICD-10-CM | POA: Diagnosis not present

## 2012-06-03 DIAGNOSIS — J449 Chronic obstructive pulmonary disease, unspecified: Secondary | ICD-10-CM | POA: Diagnosis not present

## 2012-06-04 DIAGNOSIS — F411 Generalized anxiety disorder: Secondary | ICD-10-CM | POA: Diagnosis not present

## 2012-06-04 DIAGNOSIS — A419 Sepsis, unspecified organism: Secondary | ICD-10-CM | POA: Diagnosis not present

## 2012-06-04 DIAGNOSIS — J449 Chronic obstructive pulmonary disease, unspecified: Secondary | ICD-10-CM | POA: Diagnosis not present

## 2012-06-04 DIAGNOSIS — N12 Tubulo-interstitial nephritis, not specified as acute or chronic: Secondary | ICD-10-CM | POA: Diagnosis not present

## 2012-06-04 DIAGNOSIS — E1149 Type 2 diabetes mellitus with other diabetic neurological complication: Secondary | ICD-10-CM | POA: Diagnosis not present

## 2012-06-04 DIAGNOSIS — E1142 Type 2 diabetes mellitus with diabetic polyneuropathy: Secondary | ICD-10-CM | POA: Diagnosis not present

## 2012-06-07 DIAGNOSIS — N12 Tubulo-interstitial nephritis, not specified as acute or chronic: Secondary | ICD-10-CM | POA: Diagnosis not present

## 2012-06-07 DIAGNOSIS — F411 Generalized anxiety disorder: Secondary | ICD-10-CM | POA: Diagnosis not present

## 2012-06-07 DIAGNOSIS — A419 Sepsis, unspecified organism: Secondary | ICD-10-CM | POA: Diagnosis not present

## 2012-06-07 DIAGNOSIS — J449 Chronic obstructive pulmonary disease, unspecified: Secondary | ICD-10-CM | POA: Diagnosis not present

## 2012-06-07 DIAGNOSIS — E1142 Type 2 diabetes mellitus with diabetic polyneuropathy: Secondary | ICD-10-CM | POA: Diagnosis not present

## 2012-06-07 DIAGNOSIS — E1149 Type 2 diabetes mellitus with other diabetic neurological complication: Secondary | ICD-10-CM | POA: Diagnosis not present

## 2012-06-08 ENCOUNTER — Ambulatory Visit: Payer: Medicare Other | Admitting: Pulmonary Disease

## 2012-06-08 ENCOUNTER — Encounter (HOSPITAL_COMMUNITY): Payer: Medicare Other

## 2012-06-08 DIAGNOSIS — E1142 Type 2 diabetes mellitus with diabetic polyneuropathy: Secondary | ICD-10-CM | POA: Diagnosis not present

## 2012-06-08 DIAGNOSIS — J449 Chronic obstructive pulmonary disease, unspecified: Secondary | ICD-10-CM | POA: Diagnosis not present

## 2012-06-08 DIAGNOSIS — I509 Heart failure, unspecified: Secondary | ICD-10-CM | POA: Diagnosis not present

## 2012-06-08 DIAGNOSIS — F411 Generalized anxiety disorder: Secondary | ICD-10-CM | POA: Diagnosis not present

## 2012-06-08 DIAGNOSIS — N12 Tubulo-interstitial nephritis, not specified as acute or chronic: Secondary | ICD-10-CM | POA: Diagnosis not present

## 2012-06-08 DIAGNOSIS — J4489 Other specified chronic obstructive pulmonary disease: Secondary | ICD-10-CM | POA: Diagnosis not present

## 2012-06-08 DIAGNOSIS — A419 Sepsis, unspecified organism: Secondary | ICD-10-CM | POA: Diagnosis not present

## 2012-06-08 DIAGNOSIS — E1149 Type 2 diabetes mellitus with other diabetic neurological complication: Secondary | ICD-10-CM | POA: Diagnosis not present

## 2012-06-09 DIAGNOSIS — J449 Chronic obstructive pulmonary disease, unspecified: Secondary | ICD-10-CM | POA: Diagnosis not present

## 2012-06-09 DIAGNOSIS — E1149 Type 2 diabetes mellitus with other diabetic neurological complication: Secondary | ICD-10-CM | POA: Diagnosis not present

## 2012-06-09 DIAGNOSIS — N12 Tubulo-interstitial nephritis, not specified as acute or chronic: Secondary | ICD-10-CM | POA: Diagnosis not present

## 2012-06-09 DIAGNOSIS — A419 Sepsis, unspecified organism: Secondary | ICD-10-CM | POA: Diagnosis not present

## 2012-06-09 DIAGNOSIS — F411 Generalized anxiety disorder: Secondary | ICD-10-CM | POA: Diagnosis not present

## 2012-06-09 DIAGNOSIS — E1142 Type 2 diabetes mellitus with diabetic polyneuropathy: Secondary | ICD-10-CM | POA: Diagnosis not present

## 2012-06-09 NOTE — Progress Notes (Signed)
PULMONARY REHAB OUTCOMES REPORT  Patient completed 31 sessions out of 36. Patient did well at the beginning of rehab and as time went on she had several c/o dyspneic, chest pain, pain all over, and fatigue. Pt missed several weeks due to follow up appointments with doctors regarding all issues just listed. Patient did complete a pre 6 minute walk test on 10/28/2011 and did really well. Patient maintained oxygen sats  On 4L with 6 min walk test. Patient unable to complete a post 6 minute walk test due to being so short of breath, high hr, as well as so many recent hospitalizations--patient was just too weak. RN agreed. Patient did turn in quality of life paperwork Listed below are the patients scores   PRE QOL                                     POST QOL                          PERCENT CHANGE Overall 13.94                                Overall 18.04                                    29.41 Health & Function 9.97                 Health & Function 11.22                   12.54 Socioeconomic  21.58                 Socioeconomic 22.64                       4.91 Psychological/Spirtual 11.58       Psychological/Spirtual 23.07             99.22 Family 20.30                                Family 26.40       30.05   Pre education Score 11/14 Post education score 12/14 Percent change 9.09  Dyspnea pre score 83 Dyspnea post score 135 Percent change 58.82  Pt is making many healthy food choices.  Pt wt down 0.6 kg during rehab. BMI 37.6. Section Completed by: Mickle Plumb, M.Ed, RD, LDN, CDE    Patient was given home exercise on 03/16/2012 and was doing great with it until recently but as you can see in her EPIC notes she has been to numerous doctors, several tests ran and several hospitalizations. Patient may come to the maintenance program when she is ready.  Courtney L. Allred, MS, NASM, CES  Ms. Corzine was very compliant in attendance for Pulmonary Rehab.  She was very limited to the amount of  exercise she could do due to increased heart rates and low oxygen saturations.  The issues were addressed by her physicians.  We have encouraged her to  return to maintenance program once she has completed her home health program that was started at her recent hospitalization discharge.   Cathie Olden  RN

## 2012-06-10 ENCOUNTER — Encounter (HOSPITAL_COMMUNITY): Payer: Medicare Other

## 2012-06-10 DIAGNOSIS — E1142 Type 2 diabetes mellitus with diabetic polyneuropathy: Secondary | ICD-10-CM | POA: Diagnosis not present

## 2012-06-10 DIAGNOSIS — N12 Tubulo-interstitial nephritis, not specified as acute or chronic: Secondary | ICD-10-CM | POA: Diagnosis not present

## 2012-06-10 DIAGNOSIS — F411 Generalized anxiety disorder: Secondary | ICD-10-CM | POA: Diagnosis not present

## 2012-06-10 DIAGNOSIS — J449 Chronic obstructive pulmonary disease, unspecified: Secondary | ICD-10-CM | POA: Diagnosis not present

## 2012-06-10 DIAGNOSIS — E1149 Type 2 diabetes mellitus with other diabetic neurological complication: Secondary | ICD-10-CM | POA: Diagnosis not present

## 2012-06-10 DIAGNOSIS — A419 Sepsis, unspecified organism: Secondary | ICD-10-CM | POA: Diagnosis not present

## 2012-06-11 DIAGNOSIS — J449 Chronic obstructive pulmonary disease, unspecified: Secondary | ICD-10-CM | POA: Diagnosis not present

## 2012-06-11 DIAGNOSIS — E1142 Type 2 diabetes mellitus with diabetic polyneuropathy: Secondary | ICD-10-CM | POA: Diagnosis not present

## 2012-06-11 DIAGNOSIS — F411 Generalized anxiety disorder: Secondary | ICD-10-CM | POA: Diagnosis not present

## 2012-06-11 DIAGNOSIS — E1149 Type 2 diabetes mellitus with other diabetic neurological complication: Secondary | ICD-10-CM | POA: Diagnosis not present

## 2012-06-11 DIAGNOSIS — N12 Tubulo-interstitial nephritis, not specified as acute or chronic: Secondary | ICD-10-CM | POA: Diagnosis not present

## 2012-06-11 DIAGNOSIS — A419 Sepsis, unspecified organism: Secondary | ICD-10-CM | POA: Diagnosis not present

## 2012-06-14 DIAGNOSIS — E1142 Type 2 diabetes mellitus with diabetic polyneuropathy: Secondary | ICD-10-CM | POA: Diagnosis not present

## 2012-06-14 DIAGNOSIS — H40009 Preglaucoma, unspecified, unspecified eye: Secondary | ICD-10-CM | POA: Diagnosis not present

## 2012-06-14 DIAGNOSIS — H18419 Arcus senilis, unspecified eye: Secondary | ICD-10-CM | POA: Diagnosis not present

## 2012-06-14 DIAGNOSIS — N12 Tubulo-interstitial nephritis, not specified as acute or chronic: Secondary | ICD-10-CM | POA: Diagnosis not present

## 2012-06-14 DIAGNOSIS — J449 Chronic obstructive pulmonary disease, unspecified: Secondary | ICD-10-CM | POA: Diagnosis not present

## 2012-06-14 DIAGNOSIS — E119 Type 2 diabetes mellitus without complications: Secondary | ICD-10-CM | POA: Diagnosis not present

## 2012-06-14 DIAGNOSIS — E1149 Type 2 diabetes mellitus with other diabetic neurological complication: Secondary | ICD-10-CM | POA: Diagnosis not present

## 2012-06-14 DIAGNOSIS — F411 Generalized anxiety disorder: Secondary | ICD-10-CM | POA: Diagnosis not present

## 2012-06-14 DIAGNOSIS — H11159 Pinguecula, unspecified eye: Secondary | ICD-10-CM | POA: Diagnosis not present

## 2012-06-14 DIAGNOSIS — A419 Sepsis, unspecified organism: Secondary | ICD-10-CM | POA: Diagnosis not present

## 2012-06-15 ENCOUNTER — Encounter (HOSPITAL_COMMUNITY): Payer: Medicare Other

## 2012-06-15 DIAGNOSIS — N12 Tubulo-interstitial nephritis, not specified as acute or chronic: Secondary | ICD-10-CM | POA: Diagnosis not present

## 2012-06-15 DIAGNOSIS — F411 Generalized anxiety disorder: Secondary | ICD-10-CM | POA: Diagnosis not present

## 2012-06-15 DIAGNOSIS — E1149 Type 2 diabetes mellitus with other diabetic neurological complication: Secondary | ICD-10-CM | POA: Diagnosis not present

## 2012-06-15 DIAGNOSIS — J449 Chronic obstructive pulmonary disease, unspecified: Secondary | ICD-10-CM | POA: Diagnosis not present

## 2012-06-15 DIAGNOSIS — E1142 Type 2 diabetes mellitus with diabetic polyneuropathy: Secondary | ICD-10-CM | POA: Diagnosis not present

## 2012-06-15 DIAGNOSIS — A419 Sepsis, unspecified organism: Secondary | ICD-10-CM | POA: Diagnosis not present

## 2012-06-16 ENCOUNTER — Encounter: Payer: Self-pay | Admitting: Pulmonary Disease

## 2012-06-16 ENCOUNTER — Ambulatory Visit (INDEPENDENT_AMBULATORY_CARE_PROVIDER_SITE_OTHER): Payer: Medicare Other | Admitting: Pulmonary Disease

## 2012-06-16 VITALS — BP 102/70 | HR 101 | Temp 98.2°F | Ht 66.0 in | Wt 227.6 lb

## 2012-06-16 DIAGNOSIS — R0602 Shortness of breath: Secondary | ICD-10-CM | POA: Diagnosis not present

## 2012-06-16 DIAGNOSIS — J449 Chronic obstructive pulmonary disease, unspecified: Secondary | ICD-10-CM | POA: Diagnosis not present

## 2012-06-16 NOTE — Assessment & Plan Note (Signed)
The patient appears to be stable from a COPD standpoint at this time.  She is staying on her bronchodilator regimen, and has no bronchospasm by exam.  I have stressed her the importance of staying on her exercise and weight loss program.

## 2012-06-16 NOTE — Patient Instructions (Addendum)
Stay on your current breathing medications Keep working on exercise program and weight loss.  You are doing well so far. followup with me in 4mos, but call if something changes.

## 2012-06-16 NOTE — Assessment & Plan Note (Signed)
The patient has multifactorial dyspnea on exertion, and related to her COPD, obesity with deconditioning, her diastolic heart failure, and a questionable shunt that has not been recognized as of yet.  I have asked her to continue on her breathing medications, and it will be important to keep her as euvolemic as possible.  She is now on amiodarone for atrial fibrillation, and we'll have to watch her closely for possible pulmonary toxicity.

## 2012-06-16 NOTE — Progress Notes (Signed)
  Subjective:    Patient ID: Destiny Shelton, female    DOB: [birth year], 67 y.o.   MRN: [id number]  HPI The patient comes in today for followup of her known COPD, and multifactorial chronic respiratory failure.  She was recently in the hospital for a urinary tract infection with what sounds like sepsis, and also developed atrial fibrillation which required treatment with amiodarone.  The patient has been on an exercise program and diet since being home, and has lost 7 pounds since last visit.  She has not had an acute exacerbation of her COPD or a pulmonary infection.   Review of Systems  Constitutional: Negative for fever and unexpected weight change.  HENT: Positive for nosebleeds ( bleeds more now with blood thinners), congestion and rhinorrhea. Negative for ear pain, sore throat, sneezing, trouble swallowing, dental problem, postnasal drip and sinus pressure.   Eyes: Negative for redness and itching.  Respiratory: Positive for cough, chest tightness, shortness of breath and wheezing.   Cardiovascular: Negative for palpitations and leg swelling.  Gastrointestinal: Negative for nausea and vomiting.  Genitourinary: Negative for dysuria.  Musculoskeletal: Positive for back pain. Negative for joint swelling.  Skin: Negative for rash.  Neurological: Positive for headaches.  Hematological: Does not bruise/bleed easily.  Psychiatric/Behavioral: Positive for dysphoric mood. The patient is not nervous/anxious.        Objective:   Physical Exam Obese female in no acute distress Nose without purulence or discharge noted Oropharynx clear Neck without lymphadenopathy or thyromegaly Chest with basilar crackles, otherwise excellent air flow and no wheezing Cardiac exam with mild tachycardia but regular Lower extremities with minimal edema, no cyanosis Alert and oriented, moves all 4 extremities.       Assessment & Plan:

## 2012-06-17 ENCOUNTER — Encounter (HOSPITAL_COMMUNITY): Payer: Medicare Other

## 2012-06-17 DIAGNOSIS — E1142 Type 2 diabetes mellitus with diabetic polyneuropathy: Secondary | ICD-10-CM | POA: Diagnosis not present

## 2012-06-17 DIAGNOSIS — E1149 Type 2 diabetes mellitus with other diabetic neurological complication: Secondary | ICD-10-CM | POA: Diagnosis not present

## 2012-06-17 DIAGNOSIS — A419 Sepsis, unspecified organism: Secondary | ICD-10-CM | POA: Diagnosis not present

## 2012-06-17 DIAGNOSIS — J449 Chronic obstructive pulmonary disease, unspecified: Secondary | ICD-10-CM | POA: Diagnosis not present

## 2012-06-17 DIAGNOSIS — F411 Generalized anxiety disorder: Secondary | ICD-10-CM | POA: Diagnosis not present

## 2012-06-17 DIAGNOSIS — N12 Tubulo-interstitial nephritis, not specified as acute or chronic: Secondary | ICD-10-CM | POA: Diagnosis not present

## 2012-06-21 DIAGNOSIS — F411 Generalized anxiety disorder: Secondary | ICD-10-CM | POA: Diagnosis not present

## 2012-06-21 DIAGNOSIS — N12 Tubulo-interstitial nephritis, not specified as acute or chronic: Secondary | ICD-10-CM | POA: Diagnosis not present

## 2012-06-21 DIAGNOSIS — E1149 Type 2 diabetes mellitus with other diabetic neurological complication: Secondary | ICD-10-CM | POA: Diagnosis not present

## 2012-06-21 DIAGNOSIS — A419 Sepsis, unspecified organism: Secondary | ICD-10-CM | POA: Diagnosis not present

## 2012-06-21 DIAGNOSIS — J449 Chronic obstructive pulmonary disease, unspecified: Secondary | ICD-10-CM | POA: Diagnosis not present

## 2012-06-21 DIAGNOSIS — E1142 Type 2 diabetes mellitus with diabetic polyneuropathy: Secondary | ICD-10-CM | POA: Diagnosis not present

## 2012-06-22 ENCOUNTER — Encounter (HOSPITAL_COMMUNITY): Payer: Medicare Other

## 2012-06-22 DIAGNOSIS — A419 Sepsis, unspecified organism: Secondary | ICD-10-CM | POA: Diagnosis not present

## 2012-06-22 DIAGNOSIS — N12 Tubulo-interstitial nephritis, not specified as acute or chronic: Secondary | ICD-10-CM | POA: Diagnosis not present

## 2012-06-22 DIAGNOSIS — E1149 Type 2 diabetes mellitus with other diabetic neurological complication: Secondary | ICD-10-CM | POA: Diagnosis not present

## 2012-06-22 DIAGNOSIS — E1142 Type 2 diabetes mellitus with diabetic polyneuropathy: Secondary | ICD-10-CM | POA: Diagnosis not present

## 2012-06-22 DIAGNOSIS — F411 Generalized anxiety disorder: Secondary | ICD-10-CM | POA: Diagnosis not present

## 2012-06-22 DIAGNOSIS — J449 Chronic obstructive pulmonary disease, unspecified: Secondary | ICD-10-CM | POA: Diagnosis not present

## 2012-06-23 DIAGNOSIS — F411 Generalized anxiety disorder: Secondary | ICD-10-CM | POA: Diagnosis not present

## 2012-06-23 DIAGNOSIS — N12 Tubulo-interstitial nephritis, not specified as acute or chronic: Secondary | ICD-10-CM | POA: Diagnosis not present

## 2012-06-23 DIAGNOSIS — J449 Chronic obstructive pulmonary disease, unspecified: Secondary | ICD-10-CM | POA: Diagnosis not present

## 2012-06-23 DIAGNOSIS — E1142 Type 2 diabetes mellitus with diabetic polyneuropathy: Secondary | ICD-10-CM | POA: Diagnosis not present

## 2012-06-23 DIAGNOSIS — A419 Sepsis, unspecified organism: Secondary | ICD-10-CM | POA: Diagnosis not present

## 2012-06-23 DIAGNOSIS — E1149 Type 2 diabetes mellitus with other diabetic neurological complication: Secondary | ICD-10-CM | POA: Diagnosis not present

## 2012-06-23 DIAGNOSIS — I4891 Unspecified atrial fibrillation: Secondary | ICD-10-CM | POA: Diagnosis not present

## 2012-06-23 DIAGNOSIS — K219 Gastro-esophageal reflux disease without esophagitis: Secondary | ICD-10-CM | POA: Diagnosis not present

## 2012-06-24 ENCOUNTER — Encounter (HOSPITAL_COMMUNITY): Payer: Medicare Other

## 2012-06-24 ENCOUNTER — Ambulatory Visit: Payer: Medicare Other | Admitting: Pulmonary Disease

## 2012-06-24 DIAGNOSIS — J449 Chronic obstructive pulmonary disease, unspecified: Secondary | ICD-10-CM | POA: Diagnosis not present

## 2012-06-24 DIAGNOSIS — F411 Generalized anxiety disorder: Secondary | ICD-10-CM | POA: Diagnosis not present

## 2012-06-24 DIAGNOSIS — N12 Tubulo-interstitial nephritis, not specified as acute or chronic: Secondary | ICD-10-CM | POA: Diagnosis not present

## 2012-06-24 DIAGNOSIS — E1142 Type 2 diabetes mellitus with diabetic polyneuropathy: Secondary | ICD-10-CM | POA: Diagnosis not present

## 2012-06-24 DIAGNOSIS — A419 Sepsis, unspecified organism: Secondary | ICD-10-CM | POA: Diagnosis not present

## 2012-06-24 DIAGNOSIS — E1149 Type 2 diabetes mellitus with other diabetic neurological complication: Secondary | ICD-10-CM | POA: Diagnosis not present

## 2012-06-29 ENCOUNTER — Encounter (HOSPITAL_COMMUNITY): Payer: Medicare Other

## 2012-06-30 DIAGNOSIS — R0902 Hypoxemia: Secondary | ICD-10-CM | POA: Diagnosis not present

## 2012-06-30 DIAGNOSIS — I4891 Unspecified atrial fibrillation: Secondary | ICD-10-CM | POA: Diagnosis not present

## 2012-06-30 DIAGNOSIS — J449 Chronic obstructive pulmonary disease, unspecified: Secondary | ICD-10-CM | POA: Diagnosis not present

## 2012-06-30 DIAGNOSIS — I10 Essential (primary) hypertension: Secondary | ICD-10-CM | POA: Diagnosis not present

## 2012-06-30 DIAGNOSIS — R0602 Shortness of breath: Secondary | ICD-10-CM | POA: Diagnosis not present

## 2012-06-30 DIAGNOSIS — I5032 Chronic diastolic (congestive) heart failure: Secondary | ICD-10-CM | POA: Diagnosis not present

## 2012-06-30 DIAGNOSIS — I509 Heart failure, unspecified: Secondary | ICD-10-CM | POA: Diagnosis not present

## 2012-07-01 ENCOUNTER — Encounter (HOSPITAL_COMMUNITY): Payer: Medicare Other

## 2012-07-01 DIAGNOSIS — J449 Chronic obstructive pulmonary disease, unspecified: Secondary | ICD-10-CM | POA: Diagnosis not present

## 2012-07-01 DIAGNOSIS — A419 Sepsis, unspecified organism: Secondary | ICD-10-CM | POA: Diagnosis not present

## 2012-07-01 DIAGNOSIS — N12 Tubulo-interstitial nephritis, not specified as acute or chronic: Secondary | ICD-10-CM | POA: Diagnosis not present

## 2012-07-01 DIAGNOSIS — F411 Generalized anxiety disorder: Secondary | ICD-10-CM | POA: Diagnosis not present

## 2012-07-01 DIAGNOSIS — E1149 Type 2 diabetes mellitus with other diabetic neurological complication: Secondary | ICD-10-CM | POA: Diagnosis not present

## 2012-07-01 DIAGNOSIS — E1142 Type 2 diabetes mellitus with diabetic polyneuropathy: Secondary | ICD-10-CM | POA: Diagnosis not present

## 2012-07-06 ENCOUNTER — Encounter (HOSPITAL_COMMUNITY): Payer: Medicare Other

## 2012-07-08 ENCOUNTER — Encounter (HOSPITAL_COMMUNITY): Payer: Medicare Other

## 2012-07-13 ENCOUNTER — Encounter (HOSPITAL_COMMUNITY): Payer: Medicare Other

## 2012-07-14 DIAGNOSIS — A419 Sepsis, unspecified organism: Secondary | ICD-10-CM | POA: Diagnosis not present

## 2012-07-14 DIAGNOSIS — J449 Chronic obstructive pulmonary disease, unspecified: Secondary | ICD-10-CM | POA: Diagnosis not present

## 2012-07-14 DIAGNOSIS — N12 Tubulo-interstitial nephritis, not specified as acute or chronic: Secondary | ICD-10-CM | POA: Diagnosis not present

## 2012-07-14 DIAGNOSIS — E1142 Type 2 diabetes mellitus with diabetic polyneuropathy: Secondary | ICD-10-CM | POA: Diagnosis not present

## 2012-07-14 DIAGNOSIS — F411 Generalized anxiety disorder: Secondary | ICD-10-CM | POA: Diagnosis not present

## 2012-07-14 DIAGNOSIS — E1149 Type 2 diabetes mellitus with other diabetic neurological complication: Secondary | ICD-10-CM | POA: Diagnosis not present

## 2012-07-15 ENCOUNTER — Encounter (HOSPITAL_COMMUNITY): Payer: Medicare Other

## 2012-07-20 ENCOUNTER — Encounter (HOSPITAL_COMMUNITY): Payer: Medicare Other

## 2012-07-21 DIAGNOSIS — R5381 Other malaise: Secondary | ICD-10-CM | POA: Diagnosis not present

## 2012-07-21 DIAGNOSIS — E1149 Type 2 diabetes mellitus with other diabetic neurological complication: Secondary | ICD-10-CM | POA: Diagnosis not present

## 2012-07-21 DIAGNOSIS — I509 Heart failure, unspecified: Secondary | ICD-10-CM | POA: Diagnosis not present

## 2012-07-21 DIAGNOSIS — I4891 Unspecified atrial fibrillation: Secondary | ICD-10-CM | POA: Diagnosis not present

## 2012-07-22 ENCOUNTER — Encounter (HOSPITAL_COMMUNITY): Payer: Medicare Other

## 2012-07-23 DIAGNOSIS — D649 Anemia, unspecified: Secondary | ICD-10-CM | POA: Diagnosis not present

## 2012-07-27 ENCOUNTER — Encounter (HOSPITAL_COMMUNITY): Payer: Medicare Other

## 2012-07-28 DIAGNOSIS — J449 Chronic obstructive pulmonary disease, unspecified: Secondary | ICD-10-CM | POA: Diagnosis not present

## 2012-07-28 DIAGNOSIS — E1142 Type 2 diabetes mellitus with diabetic polyneuropathy: Secondary | ICD-10-CM | POA: Diagnosis not present

## 2012-07-28 DIAGNOSIS — N12 Tubulo-interstitial nephritis, not specified as acute or chronic: Secondary | ICD-10-CM | POA: Diagnosis not present

## 2012-07-28 DIAGNOSIS — E1149 Type 2 diabetes mellitus with other diabetic neurological complication: Secondary | ICD-10-CM | POA: Diagnosis not present

## 2012-07-28 DIAGNOSIS — A419 Sepsis, unspecified organism: Secondary | ICD-10-CM | POA: Diagnosis not present

## 2012-07-28 DIAGNOSIS — F411 Generalized anxiety disorder: Secondary | ICD-10-CM | POA: Diagnosis not present

## 2012-07-29 ENCOUNTER — Encounter (HOSPITAL_COMMUNITY): Payer: Medicare Other

## 2012-08-02 DIAGNOSIS — D649 Anemia, unspecified: Secondary | ICD-10-CM | POA: Diagnosis not present

## 2012-08-12 ENCOUNTER — Encounter: Payer: Self-pay | Admitting: Internal Medicine

## 2012-08-19 ENCOUNTER — Other Ambulatory Visit (HOSPITAL_COMMUNITY): Payer: Self-pay | Admitting: Internal Medicine

## 2012-08-24 ENCOUNTER — Encounter (HOSPITAL_COMMUNITY)
Admission: RE | Admit: 2012-08-24 | Discharge: 2012-08-24 | Disposition: A | Discharge status: Home / Self Care | Payer: Medicare Other | Source: Ambulatory Visit | Attending: Internal Medicine | Admitting: Internal Medicine

## 2012-08-24 ENCOUNTER — Encounter (HOSPITAL_COMMUNITY): Payer: Self-pay

## 2012-08-24 DIAGNOSIS — D649 Anemia, unspecified: Secondary | ICD-10-CM | POA: Diagnosis not present

## 2012-08-24 MED ORDER — FERUMOXYTOL INJECTION 510 MG/17 ML
510.0000 mg | INTRAVENOUS | Status: DC
Start: 2012-08-24 — End: 2012-08-25
  Administered 2012-08-24: 510 mg via INTRAVENOUS
  Filled 2012-08-24: qty 17

## 2012-08-24 MED ORDER — SODIUM CHLORIDE 0.9 % IV SOLN
INTRAVENOUS | Status: DC
  Administered 2012-08-24: 13:00:00 via INTRAVENOUS

## 2012-08-26 DIAGNOSIS — R0602 Shortness of breath: Secondary | ICD-10-CM | POA: Diagnosis not present

## 2012-08-26 DIAGNOSIS — I472 Ventricular tachycardia: Secondary | ICD-10-CM | POA: Diagnosis not present

## 2012-08-26 DIAGNOSIS — I4891 Unspecified atrial fibrillation: Secondary | ICD-10-CM | POA: Diagnosis not present

## 2012-08-31 ENCOUNTER — Encounter (HOSPITAL_COMMUNITY)
Admission: RE | Admit: 2012-08-31 | Discharge: 2012-08-31 | Disposition: A | Discharge status: Home / Self Care | Payer: Medicare Other | Source: Ambulatory Visit | Attending: Internal Medicine | Admitting: Internal Medicine

## 2012-08-31 ENCOUNTER — Encounter (HOSPITAL_COMMUNITY): Payer: Self-pay

## 2012-08-31 DIAGNOSIS — D649 Anemia, unspecified: Secondary | ICD-10-CM | POA: Insufficient documentation

## 2012-08-31 MED ORDER — SODIUM CHLORIDE 0.9 % IV SOLN
INTRAVENOUS | Status: AC
  Administered 2012-08-31: 13:00:00 via INTRAVENOUS

## 2012-08-31 MED ORDER — FERUMOXYTOL INJECTION 510 MG/17 ML
510.0000 mg | INTRAVENOUS | Status: AC
  Administered 2012-08-31: 510 mg via INTRAVENOUS
  Filled 2012-08-31: qty 17

## 2012-09-01 ENCOUNTER — Encounter: Payer: Self-pay | Admitting: *Deleted

## 2012-09-03 ENCOUNTER — Ambulatory Visit (INDEPENDENT_AMBULATORY_CARE_PROVIDER_SITE_OTHER): Payer: Medicare Other | Admitting: Internal Medicine

## 2012-09-03 ENCOUNTER — Encounter: Payer: Self-pay | Admitting: Internal Medicine

## 2012-09-03 VITALS — BP 110/58 | HR 100 | Ht 65.0 in | Wt 226.2 lb

## 2012-09-03 DIAGNOSIS — R195 Other fecal abnormalities: Secondary | ICD-10-CM | POA: Diagnosis not present

## 2012-09-03 DIAGNOSIS — Z7901 Long term (current) use of anticoagulants: Secondary | ICD-10-CM

## 2012-09-03 DIAGNOSIS — I4891 Unspecified atrial fibrillation: Secondary | ICD-10-CM

## 2012-09-03 DIAGNOSIS — D509 Iron deficiency anemia, unspecified: Secondary | ICD-10-CM

## 2012-09-03 MED ORDER — NA SULFATE-K SULFATE-MG SULF 17.5-3.13-1.6 GM/177ML PO SOLN: ORAL | Status: DC

## 2012-09-03 NOTE — Progress Notes (Signed)
Subjective:    Patient ID: Destiny Shelton, female    DOB: 02/17/1946, 67 y.o.   MRN: [id number]  HPI The patient was hospitalized with urosepsis in late 2013. She was anemic and iron-deficient then Repeat evaluation at follow-up confirmed this with microcytic anemia and low ferritin and 3/3 guaiac cards have been +.  She had been seen in office follow-up in past 2 years and it was decided not to pursue routine repeat colonoscopy (hx adenomatous polyps) due to co-morbidities.  Remains on 4L [place] O2 Allergies  Allergen Reactions  . Aspirin Shortness Of Breath  . Nsaids Shortness Of Breath   Outpatient Prescriptions Prior to Visit  Medication Sig Dispense Refill  . alendronate (FOSAMAX) 70 MG tablet Take 70 mg by mouth every 7 (seven) days. Take with a full glass of water on an empty stomach. Takes on Thursday.      . ALPRAZolam (XANAX) 0.5 MG tablet Take 0.5-1 mg by mouth at bedtime as needed. For anxiety      . benzonatate (TESSALON) 200 MG capsule Take 200 mg by mouth 3 (three) times daily as needed. For cough      . budesonide-formoterol (SYMBICORT) 160-4.5 MCG/ACT inhaler Inhale 2 puffs into the lungs 2 (two) times daily.        . CALCIUM-VITAMIN D PO Take 1 tablet by mouth 2 (two) times daily.       . cyclobenzaprine (FLEXERIL) 10 MG tablet Take 10 mg by mouth at bedtime.       . docusate sodium (COLACE) 100 MG capsule Take 100 mg by mouth 2 (two) times daily.        . DULoxetine (CYMBALTA) 60 MG capsule Take 60 mg by mouth daily.        . furosemide (LASIX) 20 MG tablet Take 2 tablets (40 mg total) by mouth 2 (two) times daily.  60 tablet  6  . gabapentin (NEURONTIN) 600 MG tablet Take 600 mg by mouth 3 (three) times daily as needed. For pain      . hydrocodone-acetaminophen (LORCET-HD) 5-500 MG per capsule Take 1 capsule by mouth every 8 (eight) hours as needed. For pain      . insulin aspart (NOVOLOG) 100 UNIT/ML injection Inject 10 Units into the skin 2 (two) times daily.       .  insulin glargine (LANTUS) 100 UNIT/ML injection Inject 70 Units into the skin daily before breakfast.  10 mL    . iron polysaccharides (NIFEREX) 150 MG capsule Take 1 capsule (150 mg total) by mouth daily.  30 capsule  6  . levalbuterol (XOPENEX HFA) 45 MCG/ACT inhaler Inhale 1-2 puffs into the lungs every 4 (four) hours as needed for wheezing.  1 Inhaler  12  . Multiple Vitamin (MULTIVITAMIN) tablet Take 1 tablet by mouth daily.        . Omega-3 Fatty Acids (FISH OIL) 1000 MG CAPS Take 1 capsule by mouth daily.        . omeprazole (PRILOSEC) 20 MG capsule Take 20 mg by mouth daily.        . potassium chloride (KLOR-CON) 10 MEQ CR tablet Take 20-40 mEq by mouth 2 (two) times daily. Takes 40meq in AM and 20meq in PM      . pravastatin (PRAVACHOL) 40 MG tablet Take 80 mg by mouth at bedtime.       . Rivaroxaban (XARELTO) 20 MG TABS Take 1 tablet (20 mg total) by mouth daily with supper.  30 tablet    6  . roflumilast (DALIRESP) 500 MCG TABS tablet Take 500 mcg by mouth daily.        . tiotropium (SPIRIVA) 18 MCG inhalation capsule Place 18 mcg into inhaler and inhale daily.        . valsartan (DIOVAN) 160 MG tablet Take 320 mg by mouth daily.      . verapamil (VERELAN PM) 360 MG 24 hr capsule Take 360 mg by mouth daily.      . amiodarone (PACERONE) 400 MG tablet Take 1 tablet (400 mg total) by mouth 2 (two) times daily. Take 1 pill twice a day X 7 days, then 1 pill daily  37 tablet  3   No facility-administered medications prior to visit.   Past Medical History  Diagnosis Date  . ALLERGIC RHINITIS   . Sleep apnea   . Hypertension   . Adenomatous colon polyp   . Gastritis   . External hemorrhoids   . Diverticulosis of colon   . Diabetes mellitus type 2 with neurological manifestations     On Insulin  . Depression   . Osteoporosis   . Morbidly obese   . Nephrolithiasis   . Back pain, chronic   . Osteoarthritis   . Anxiety   . Hyperlipemia   . Atrial tachycardia     Mostly Sinus  Tachycardia  . Asthma   . COPD mixed type     Requiring Home O2 at 4L  . Shortness of breath   . GERD (gastroesophageal reflux disease)   . Headache     headaches  . Neuromuscular disorder     diabetic neuropathy  . Fibromyalgia     "pain in arms and shoulders."  . Anemia     iron deficient  . Carotid stenosis   . A-fib    Past Surgical History  Procedure Laterality Date  . Appendectomy  1963  . Cholecystectomy  1963  . Abdominal hysterectomy  1978  . Total knee arthroplasty  1997    right  . Upper gastrointestinal endoscopy  04/09/2006    w/Dilation, gastritis  . Colonoscopy w/ biopsies and polypectomy  07/22/2007, 04/28/2007    2009: diverticulosis, 2008: diverticulosis, adenomatous polyps  . Tee without cardioversion  03/24/2012    Procedure: TRANSESOPHAGEAL ECHOCARDIOGRAM (TEE);  Surgeon: Kenneth C. Hilty, MD;  Location: [facility] OR;  Service: Cardiovascular;  Laterality: N/A;  TEE with Bubble   History   Social History  . Marital Status: Married    Spouse Name: N/A    Number of Children: 2  . Years of Education: HS   Occupational History  . Disability    Social History Main Topics  . Smoking status: Former Smoker -- 2.00 packs/day for 30 years    Types: Cigarettes    Quit date: 06/02/1994  . Smokeless tobacco: Never Used  . Alcohol Use: No  . Drug Use: No  . Sexually Active: None   Other Topics Concern  . None   Social History Narrative   Daily caffeine    Family History  Problem Relation Age of Onset  . Heart disease Mother   . Heart disease Father   . Heart disease Brother   . Stroke Mother   . Stroke Brother   . Skin cancer Brother   . Colon cancer Neg Hx   . Colon polyps Sister   . Diabetes Sister   . Diabetes Brother   . Irritable bowel syndrome Daughter          Review   of Systems +DOE, orthopne, chronic back pain    Objective:   Physical Exam General:  Chronically ill on 4L [place] O2 Eyes:  anicteric. ENT:   Mouth and posterior pharynx  without ulcers, she is missing teeth and others in poor repair  Neck:   supple w/o thyromegaly or mass.  Lungs: Clear to auscultation bilaterally posterior with scattered anterior wheezes Heart:  S1S2, no rubs, murmurs, gallops. Abdomen:  obese, soft, non-tender, no hepatosplenomegaly, hernia, or mass and BS+.  Rectal: deferred Lymph:  no cervical or supraclavicular adenopathy. Extremities:   no edema Skin   no rash. Neuro:  A&O x 3.  Psych:  appropriate mood and  Affect.   Data Reviewed: Labs from Dr. Shaw  Lab Results  Component Value Date   WBC 13.7* 06/01/2012   HGB 9.9* 06/01/2012   HCT 33.4* 06/01/2012   MCV 69.3* 06/01/2012   PLT 322 06/01/2012   Lab Results  Component Value Date   FERRITIN 32 05/29/2012         Assessment & Plan:  Anemia, iron deficiency  Heme + stool  Long term (current) use of anticoagulants  Atrial fibrillation  Cor pulmonale, chronic O2  Hx of cecal adenoma  She could have a recurrence of cecal adenoma, new polyps, carcinoma of GI tract, other bleeding lesions like AVM's. Given chronic anticoagulation she is at higher risk of losing blood from any lesion. She is complicated and a classIV ASA patient with increased risk of complications from sedtion and endoscopy. However - has anemia presumed from chronic blood loss and this affects her functional status so endoscopic investigation is appropriate. I think holding Xarelto 48 hrs prior to procedures appropriate overall. Will check with her cardiologist Dr. Harding.  1. Schedule egd and colonoscopy at [facility] The risks and benefits as well as alternatives of endoscopic procedure(s) have been discussed and reviewed. All questions answered. The patient agrees to proceed. Increased risk of stroke off xarelto reviewed with patient and husband. Further plans pending evaluation  I appreciate the opportunity to care for this patient.  Cc:SHAW,W DOUGLAS, MD, DAVID HARDING, MD   

## 2012-09-03 NOTE — Patient Instructions (Addendum)
You have been scheduled for an endoscopy and colonoscopy with propofol. Please follow the written instructions given to you at your visit today. Please pick up your prep at the pharmacy within the next 1-3 days. If you use inhalers (even only as needed), please bring them with you on the day of your procedure.  Please use Miralax 1/2 - 1 dose a day for constipation.  You will be contaced by our office prior to your procedure for directions on holding your Xarelto. If you do not hear from our office 1 week prior to your scheduled procedure, please call [phone] to discuss  _   Thank you for choosing me and [facility].  Iva Boop, M.D., [facility]

## 2012-09-04 ENCOUNTER — Encounter: Payer: Self-pay | Admitting: Internal Medicine

## 2012-09-10 ENCOUNTER — Telehealth: Payer: Self-pay

## 2012-09-10 NOTE — Telephone Encounter (Signed)
Received fax back from Dr. Elissa Hefty office stating patient can come off Xarelto 48-72 hours prior to procedure. Also informed patient to restart Xarelto the same day of her procedure if no polyps were removed but she will get further instructions in recovery after procedure. Pt agreed and verbalized understanding. Letter to be scanned into Epic.

## 2012-09-13 ENCOUNTER — Encounter (HOSPITAL_COMMUNITY): Payer: Self-pay

## 2012-09-13 DIAGNOSIS — Z961 Presence of intraocular lens: Secondary | ICD-10-CM | POA: Diagnosis not present

## 2012-09-13 DIAGNOSIS — H4011X0 Primary open-angle glaucoma, stage unspecified: Secondary | ICD-10-CM | POA: Diagnosis not present

## 2012-09-13 DIAGNOSIS — H353190 Nonexudative age-related macular degeneration, unspecified eye, stage unspecified: Secondary | ICD-10-CM | POA: Diagnosis not present

## 2012-09-14 ENCOUNTER — Encounter (HOSPITAL_COMMUNITY): Payer: Self-pay | Admitting: *Deleted

## 2012-09-16 DIAGNOSIS — H40029 Open angle with borderline findings, high risk, unspecified eye: Secondary | ICD-10-CM | POA: Diagnosis not present

## 2012-09-28 DIAGNOSIS — H40019 Open angle with borderline findings, low risk, unspecified eye: Secondary | ICD-10-CM | POA: Diagnosis not present

## 2012-09-28 DIAGNOSIS — Z961 Presence of intraocular lens: Secondary | ICD-10-CM | POA: Diagnosis not present

## 2012-10-01 ENCOUNTER — Encounter (HOSPITAL_COMMUNITY)
Admission: RE | Disposition: A | Discharge status: Home / Self Care | Payer: Self-pay | Source: Ambulatory Visit | Attending: Internal Medicine

## 2012-10-01 ENCOUNTER — Encounter (HOSPITAL_COMMUNITY): Payer: Self-pay | Admitting: *Deleted

## 2012-10-01 ENCOUNTER — Encounter (HOSPITAL_COMMUNITY): Payer: Self-pay | Admitting: Anesthesiology

## 2012-10-01 ENCOUNTER — Observation Stay (HOSPITAL_COMMUNITY)
Admission: RE | Admit: 2012-10-01 | Discharge: 2012-10-02 | Disposition: A | Discharge status: Home / Self Care | Payer: Medicare Other | Source: Ambulatory Visit | Attending: Internal Medicine | Admitting: Internal Medicine

## 2012-10-01 ENCOUNTER — Ambulatory Visit (HOSPITAL_COMMUNITY): Payer: Medicare Other | Admitting: Anesthesiology

## 2012-10-01 DIAGNOSIS — G4730 Sleep apnea, unspecified: Secondary | ICD-10-CM | POA: Insufficient documentation

## 2012-10-01 DIAGNOSIS — F411 Generalized anxiety disorder: Secondary | ICD-10-CM | POA: Insufficient documentation

## 2012-10-01 DIAGNOSIS — F3289 Other specified depressive episodes: Secondary | ICD-10-CM | POA: Insufficient documentation

## 2012-10-01 DIAGNOSIS — I4891 Unspecified atrial fibrillation: Secondary | ICD-10-CM | POA: Diagnosis not present

## 2012-10-01 DIAGNOSIS — E1149 Type 2 diabetes mellitus with other diabetic neurological complication: Secondary | ICD-10-CM | POA: Insufficient documentation

## 2012-10-01 DIAGNOSIS — D126 Benign neoplasm of colon, unspecified: Secondary | ICD-10-CM

## 2012-10-01 DIAGNOSIS — Y849 Medical procedure, unspecified as the cause of abnormal reaction of the patient, or of later complication, without mention of misadventure at the time of the procedure: Secondary | ICD-10-CM | POA: Insufficient documentation

## 2012-10-01 DIAGNOSIS — IMO0002 Reserved for concepts with insufficient information to code with codable children: Secondary | ICD-10-CM | POA: Diagnosis not present

## 2012-10-01 DIAGNOSIS — K648 Other hemorrhoids: Secondary | ICD-10-CM | POA: Diagnosis not present

## 2012-10-01 DIAGNOSIS — Z7901 Long term (current) use of anticoagulants: Secondary | ICD-10-CM | POA: Insufficient documentation

## 2012-10-01 DIAGNOSIS — I10 Essential (primary) hypertension: Secondary | ICD-10-CM | POA: Insufficient documentation

## 2012-10-01 DIAGNOSIS — R0602 Shortness of breath: Secondary | ICD-10-CM | POA: Diagnosis not present

## 2012-10-01 DIAGNOSIS — D131 Benign neoplasm of stomach: Secondary | ICD-10-CM | POA: Insufficient documentation

## 2012-10-01 DIAGNOSIS — IMO0001 Reserved for inherently not codable concepts without codable children: Secondary | ICD-10-CM | POA: Insufficient documentation

## 2012-10-01 DIAGNOSIS — D649 Anemia, unspecified: Secondary | ICD-10-CM | POA: Diagnosis not present

## 2012-10-01 DIAGNOSIS — M810 Age-related osteoporosis without current pathological fracture: Secondary | ICD-10-CM | POA: Insufficient documentation

## 2012-10-01 DIAGNOSIS — K5730 Diverticulosis of large intestine without perforation or abscess without bleeding: Secondary | ICD-10-CM | POA: Diagnosis not present

## 2012-10-01 DIAGNOSIS — D509 Iron deficiency anemia, unspecified: Principal | ICD-10-CM | POA: Insufficient documentation

## 2012-10-01 DIAGNOSIS — I739 Peripheral vascular disease, unspecified: Secondary | ICD-10-CM | POA: Insufficient documentation

## 2012-10-01 DIAGNOSIS — E1142 Type 2 diabetes mellitus with diabetic polyneuropathy: Secondary | ICD-10-CM | POA: Diagnosis not present

## 2012-10-01 DIAGNOSIS — Z79899 Other long term (current) drug therapy: Secondary | ICD-10-CM | POA: Diagnosis not present

## 2012-10-01 DIAGNOSIS — K222 Esophageal obstruction: Secondary | ICD-10-CM | POA: Diagnosis not present

## 2012-10-01 DIAGNOSIS — F329 Major depressive disorder, single episode, unspecified: Secondary | ICD-10-CM | POA: Diagnosis not present

## 2012-10-01 DIAGNOSIS — K219 Gastro-esophageal reflux disease without esophagitis: Secondary | ICD-10-CM | POA: Insufficient documentation

## 2012-10-01 DIAGNOSIS — R195 Other fecal abnormalities: Secondary | ICD-10-CM | POA: Diagnosis not present

## 2012-10-01 DIAGNOSIS — D371 Neoplasm of uncertain behavior of stomach: Secondary | ICD-10-CM | POA: Diagnosis not present

## 2012-10-01 DIAGNOSIS — Q393 Congenital stenosis and stricture of esophagus: Secondary | ICD-10-CM | POA: Diagnosis not present

## 2012-10-01 DIAGNOSIS — K317 Polyp of stomach and duodenum: Secondary | ICD-10-CM | POA: Diagnosis present

## 2012-10-01 DIAGNOSIS — E876 Hypokalemia: Secondary | ICD-10-CM | POA: Diagnosis not present

## 2012-10-01 DIAGNOSIS — I279 Pulmonary heart disease, unspecified: Secondary | ICD-10-CM | POA: Insufficient documentation

## 2012-10-01 DIAGNOSIS — D378 Neoplasm of uncertain behavior of other specified digestive organs: Secondary | ICD-10-CM | POA: Diagnosis not present

## 2012-10-01 DIAGNOSIS — J449 Chronic obstructive pulmonary disease, unspecified: Secondary | ICD-10-CM | POA: Diagnosis not present

## 2012-10-01 DIAGNOSIS — Q391 Atresia of esophagus with tracheo-esophageal fistula: Secondary | ICD-10-CM | POA: Diagnosis not present

## 2012-10-01 DIAGNOSIS — E785 Hyperlipidemia, unspecified: Secondary | ICD-10-CM | POA: Diagnosis not present

## 2012-10-01 DIAGNOSIS — Z9981 Dependence on supplemental oxygen: Secondary | ICD-10-CM | POA: Insufficient documentation

## 2012-10-01 DIAGNOSIS — Z794 Long term (current) use of insulin: Secondary | ICD-10-CM | POA: Insufficient documentation

## 2012-10-01 DIAGNOSIS — J4489 Other specified chronic obstructive pulmonary disease: Secondary | ICD-10-CM | POA: Insufficient documentation

## 2012-10-01 HISTORY — PX: COLONOSCOPY WITH PROPOFOL: SHX5780

## 2012-10-01 HISTORY — PX: ESOPHAGOGASTRODUODENOSCOPY (EGD) WITH PROPOFOL: SHX5813

## 2012-10-01 LAB — POCT I-STAT 4, (NA,K, GLUC, HGB,HCT)
Glucose, Bld: 85 mg/dL (ref 70–99)
Hemoglobin: 12.6 g/dL (ref 12.0–15.0)
Potassium: 3 mEq/L — ABNORMAL LOW (ref 3.5–5.1)

## 2012-10-01 LAB — GLUCOSE, CAPILLARY
Glucose-Capillary: 80 mg/dL (ref 70–99)
Glucose-Capillary: 71 mg/dL (ref 70–99)
Glucose-Capillary: 52 mg/dL — ABNORMAL LOW (ref 70–99)
Glucose-Capillary: 77 mg/dL (ref 70–99)

## 2012-10-01 SURGERY — ESOPHAGOGASTRODUODENOSCOPY (EGD) WITH PROPOFOL
Anesthesia: Monitor Anesthesia Care

## 2012-10-01 MED ORDER — DULOXETINE HCL 60 MG PO CPEP
60.0000 mg | ORAL_CAPSULE | Freq: Every day | ORAL | Status: DC
  Administered 2012-10-01 – 2012-10-02 (×2): 60 mg via ORAL
  Filled 2012-10-01 (×2): qty 1

## 2012-10-01 MED ORDER — SODIUM CHLORIDE 0.9 % IV SOLN: INTRAVENOUS | Status: DC

## 2012-10-01 MED ORDER — ROFLUMILAST 500 MCG PO TABS
500.0000 ug | ORAL_TABLET | Freq: Every day | ORAL | Status: DC
  Administered 2012-10-01 – 2012-10-02 (×2): 500 ug via ORAL
  Filled 2012-10-01 (×2): qty 1

## 2012-10-01 MED ORDER — SODIUM CHLORIDE 0.9 % IJ SOLN: INTRAMUSCULAR | Status: DC | PRN

## 2012-10-01 MED ORDER — BUDESONIDE-FORMOTEROL FUMARATE 160-4.5 MCG/ACT IN AERO
2.0000 | INHALATION_SPRAY | Freq: Two times a day (BID) | RESPIRATORY_TRACT | Status: DC
  Administered 2012-10-01 – 2012-10-02 (×2): 2 via RESPIRATORY_TRACT
  Filled 2012-10-01: qty 6

## 2012-10-01 MED ORDER — TIOTROPIUM BROMIDE MONOHYDRATE 18 MCG IN CAPS
18.0000 ug | ORAL_CAPSULE | Freq: Every day | RESPIRATORY_TRACT | Status: DC
  Administered 2012-10-02: 18 ug via RESPIRATORY_TRACT
  Filled 2012-10-01: qty 5

## 2012-10-01 MED ORDER — INSULIN GLARGINE 100 UNIT/ML ~~LOC~~ SOLN
70.0000 [IU] | Freq: Every day | SUBCUTANEOUS | Status: DC
Start: 2012-10-02 — End: 2012-10-01

## 2012-10-01 MED ORDER — LACTATED RINGERS IV SOLN
INTRAVENOUS | Status: DC
  Administered 2012-10-01: 1000 mL via INTRAVENOUS
  Administered 2012-10-01: 09:00:00 via INTRAVENOUS

## 2012-10-01 MED ORDER — SODIUM CHLORIDE 0.9 % IJ SOLN
3.0000 mL | Freq: Two times a day (BID) | INTRAMUSCULAR | Status: DC
  Administered 2012-10-01 (×2): 3 mL via INTRAVENOUS

## 2012-10-01 MED ORDER — PROPOFOL 10 MG/ML IV EMUL
INTRAVENOUS | Status: DC | PRN
  Administered 2012-10-01: 75 ug/kg/min via INTRAVENOUS

## 2012-10-01 MED ORDER — KETAMINE HCL 10 MG/ML IJ SOLN
INTRAMUSCULAR | Status: DC | PRN
  Administered 2012-10-01 (×8): 10 mg via INTRAVENOUS

## 2012-10-01 MED ORDER — PANTOPRAZOLE SODIUM 40 MG PO TBEC
40.0000 mg | DELAYED_RELEASE_TABLET | Freq: Every day | ORAL | Status: DC
  Administered 2012-10-02: 40 mg via ORAL
  Filled 2012-10-01: qty 1

## 2012-10-01 MED ORDER — IRBESARTAN 150 MG PO TABS
150.0000 mg | ORAL_TABLET | Freq: Every day | ORAL | Status: DC
  Administered 2012-10-01 – 2012-10-02 (×2): 150 mg via ORAL
  Filled 2012-10-01 (×2): qty 1

## 2012-10-01 MED ORDER — CYCLOBENZAPRINE HCL 10 MG PO TABS
10.0000 mg | ORAL_TABLET | Freq: Every day | ORAL | Status: DC
  Administered 2012-10-01: 10 mg via ORAL
  Filled 2012-10-01 (×2): qty 1

## 2012-10-01 MED ORDER — INSULIN ASPART 100 UNIT/ML ~~LOC~~ SOLN: 0.0000 [IU] | Freq: Three times a day (TID) | SUBCUTANEOUS | Status: DC

## 2012-10-01 MED ORDER — POTASSIUM CHLORIDE CRYS ER 20 MEQ PO TBCR
20.0000 meq | EXTENDED_RELEASE_TABLET | Freq: Two times a day (BID) | ORAL | Status: DC
  Administered 2012-10-01 – 2012-10-02 (×3): 20 meq via ORAL
  Filled 2012-10-01 (×4): qty 1

## 2012-10-01 MED ORDER — MIDAZOLAM HCL 5 MG/5ML IJ SOLN
INTRAMUSCULAR | Status: DC | PRN
  Administered 2012-10-01 (×2): 1 mg via INTRAVENOUS

## 2012-10-01 MED ORDER — VERAPAMIL HCL ER 180 MG PO TBCR
360.0000 mg | EXTENDED_RELEASE_TABLET | Freq: Every day | ORAL | Status: DC
  Administered 2012-10-01 – 2012-10-02 (×2): 360 mg via ORAL
  Filled 2012-10-01 (×2): qty 2

## 2012-10-01 MED ORDER — INSULIN ASPART 100 UNIT/ML ~~LOC~~ SOLN: 10.0000 [IU] | Freq: Two times a day (BID) | SUBCUTANEOUS | Status: DC

## 2012-10-01 MED ORDER — FUROSEMIDE 40 MG PO TABS
40.0000 mg | ORAL_TABLET | Freq: Two times a day (BID) | ORAL | Status: DC
  Administered 2012-10-01 – 2012-10-02 (×3): 40 mg via ORAL
  Filled 2012-10-01 (×5): qty 1

## 2012-10-01 MED ORDER — GLUCOSE 40 % PO GEL: 1.0000 | ORAL | Status: DC | PRN

## 2012-10-01 MED ORDER — EPINEPHRINE HCL 1 MG/ML IJ SOLN
INTRAMUSCULAR | Status: DC | PRN
  Administered 2012-10-01: 4 mL

## 2012-10-01 MED ORDER — GABAPENTIN 300 MG PO CAPS
600.0000 mg | ORAL_CAPSULE | Freq: Three times a day (TID) | ORAL | Status: DC | PRN
  Administered 2012-10-01: 600 mg via ORAL
  Filled 2012-10-01: qty 2

## 2012-10-01 MED ORDER — SODIUM CHLORIDE 0.9 % IJ SOLN: 3.0000 mL | INTRAMUSCULAR | Status: DC | PRN

## 2012-10-01 MED ORDER — BENZONATATE 100 MG PO CAPS
200.0000 mg | ORAL_CAPSULE | Freq: Three times a day (TID) | ORAL | Status: DC | PRN
  Filled 2012-10-01: qty 2

## 2012-10-01 MED ORDER — VERAPAMIL HCL ER 360 MG PO CP24
360.0000 mg | ORAL_CAPSULE | Freq: Every day | ORAL | Status: DC
Start: 2012-10-01 — End: 2012-10-01

## 2012-10-01 MED ORDER — SODIUM CHLORIDE 0.9 % IV SOLN: 250.0000 mL | INTRAVENOUS | Status: DC | PRN

## 2012-10-01 MED ORDER — DEXTROSE 50 % IV SOLN: 50.0000 mL | Freq: Once | INTRAVENOUS | Status: AC | PRN

## 2012-10-01 MED ORDER — DEXTROSE 50 % IV SOLN: 25.0000 mL | Freq: Once | INTRAVENOUS | Status: AC | PRN

## 2012-10-01 MED ORDER — HYDROCODONE-ACETAMINOPHEN 5-325 MG PO TABS
1.0000 | ORAL_TABLET | ORAL | Status: DC | PRN
  Administered 2012-10-01 – 2012-10-02 (×3): 1 via ORAL
  Filled 2012-10-01 (×3): qty 1

## 2012-10-01 MED ORDER — LEVALBUTEROL TARTRATE 45 MCG/ACT IN AERO
1.0000 | INHALATION_SPRAY | RESPIRATORY_TRACT | Status: DC | PRN
  Filled 2012-10-01: qty 15

## 2012-10-01 MED ORDER — ALPRAZOLAM 0.5 MG PO TABS: 0.5000 mg | ORAL_TABLET | Freq: Every evening | ORAL | Status: DC | PRN

## 2012-10-01 SURGICAL SUPPLY — 25 items

## 2012-10-01 NOTE — Anesthesia Preprocedure Evaluation (Addendum)
Anesthesia Evaluation  Patient identified by MRN, date of birth, ID band Patient awake    Reviewed: Allergy & Precautions, H&P , NPO status , Patient's Chart, lab work & pertinent test results  History of Anesthesia Complications Negative for: history of anesthetic complications  Airway Mallampati: II TM Distance: >3 FB Neck ROM: Full    Dental  (+) Poor Dentition and Partial Upper,    Pulmonary shortness of breath, at rest, lying and Long-Term Oxygen Therapy, asthma , sleep apnea , COPD COPD inhaler and oxygen dependent,    + decreased breath sounds+ wheezing      Cardiovascular hypertension, Pt. on medications + Peripheral Vascular Disease + dysrhythmias Atrial Fibrillation Rhythm:Regular Rate:Normal     Neuro/Psych  Headaches, PSYCHIATRIC DISORDERS Anxiety Depression  Neuromuscular disease    GI/Hepatic GERD-  Medicated and Controlled,  Endo/Other  diabetes, Well Controlled, Type 2, Insulin Dependent  Renal/GU Renal disease  negative genitourinary   Musculoskeletal  (+) Fibromyalgia -  Abdominal   Peds  Hematology   Anesthesia Other Findings H/O Pulmonary Hypertension  Reproductive/Obstetrics                          Anesthesia Physical Anesthesia Plan  ASA: III  Anesthesia Plan: MAC   Post-op Pain Management:    Induction:   Airway Management Planned: Simple Face Mask  Additional Equipment:   Intra-op Plan:   Post-operative Plan:   Informed Consent:   Dental advisory given  Plan Discussed with: CRNA  Anesthesia Plan Comments:         Anesthesia Quick Evaluation

## 2012-10-01 NOTE — Transfer of Care (Signed)
Immediate Anesthesia Transfer of Care Note  Patient: Destiny Shelton  Procedure(s) Performed: Procedure(s): ESOPHAGOGASTRODUODENOSCOPY (EGD) WITH PROPOFOL (N/A) COLONOSCOPY WITH PROPOFOL (N/A)  Patient Location: PACU and Endoscopy Unit  Anesthesia Type:MAC  Level of Consciousness: awake and patient cooperative  Airway & Oxygen Therapy: Patient Spontanous Breathing and Patient connected to face mask oxygen  Post-op Assessment: Report given to PACU RN and Post -op Vital signs reviewed and stable  Post vital signs: Reviewed and stable  Complications: No apparent anesthesia complications

## 2012-10-01 NOTE — Op Note (Signed)
[facility] [address] [place], [address]   COLONOSCOPY PROCEDURE REPORT  PATIENT: Destiny, Shelton.  MR#: [id number] BIRTHDATE: 1945-07-22 , 66  yrs. old GENDER: Female ENDOSCOPIST: Iva Boop, MD, [facility] REFERRED XB:JYNW, Robert Bellow PROCEDURE DATE:  10/01/2012 PROCEDURE:   Colonoscopy with snare polypectomy and Submucosal injection, any substance   Ablation of lesions. ASA CLASS:   Class IV INDICATIONS:Iron Deficiency Anemia.   Previous polyps - had deferred routine colonoscopy due to co-morbidities, has been on Xarelto, which was held MEDICATIONS: See Anesthesia Report.  DESCRIPTION OF PROCEDURE:   After the risks benefits and alternatives of the procedure were thoroughly explained, informed consent was obtained.  A digital rectal exam revealed no abnormalities of the rectum.   The     endoscope was introduced through the anus and advanced to the cecum, which was identified by both the appendix and ileocecal valve. No adverse events experienced.   The quality of the prep was Suprep good  The instrument was then slowly withdrawn as the colon was fully examined. Images were not obtained due to equipment malfunction.      COLON FINDINGS: Multiple polypoid shaped sessile polyps were found. Polypectomy was performed with a cold snare and using snare cautery. Most were removed completely. Polyp sizes 5mm 2 cm. See below. cecum - 2 friable polyps - 12 mm recurrent and 2 cm new - APC ablation also used after polypectomy hot snare. 5 transverse polyps 5-12 mm, hot and cold snare, 1 polyp oozing blood and clipped x 2 6 descending polyps, 5-12 mm, 12 mm polyp clipped x 2 due to oozing of blood, hot snare all. sigmoid polyp, recurrent polyp, 2 cm, partially snared, ablated and EPI injection also due to bleeding, and clipped x 2.  There was severe diverticulosis noted in the sigmoid colon with associated muscular hypertrophy and luminal narrowing.    Small internal hemorrhoids were found.  Retroflexed views revealed internal hemorrhoids. The time to cecum=minutes 0 seconds. Withdrawal time=minutes 0 seconds.  The scope was withdrawn and the procedure completed. COMPLICATIONS: Immediate bleeding occurred but was controlled  ENDOSCOPIC IMPRESSION: 1.   Multiple sessile polyps (14) were found; polypectomy was performed with a cold snare and using snare cautery 2.   There was severe diverticulosis noted in the sigmoid colon 3.   Small internal hemorrhoids  RECOMMENDATIONS: 1.  Admission to hospital 2.   Observation, hold Xarelto - at least several days - ? if it was completely cleared despite holding x 2 days prior. More bleeding than I would have expected from the procedure. Overall management - she is poor anesthesia risk and I would be reluctant to aggressively pursue repeat colonoscopy.   eSigned:  Iva Boop, MD, [facility] 10/01/2012 10:23 AM   cc: Kari Baars, MD

## 2012-10-01 NOTE — Progress Notes (Signed)
Hypoglycemic Event  CBG: 59   Treatment: 15 GM carbohydrate snack Orange Juice  Symptoms: Pale Altered mental status   Follow-up CBG: Time:2238 CBG Result: 77  Possible Reasons for Event: Unknown n/a  Comments/MD notified:none    Destiny Shelton, Aileen Pilot  Remember to initiate Hypoglycemia Order Set & complete

## 2012-10-01 NOTE — Progress Notes (Signed)
Patient assisted to bathroom voided and had stool.  No blood present in stool

## 2012-10-01 NOTE — H&P (View-Only) (Signed)
Subjective:    Patient ID: Destiny Shelton, female    DOB: [birth year], 67 y.o.   MRN: [id number]  HPI The patient was hospitalized with urosepsis in late 2013. She was anemic and iron-deficient then Repeat evaluation at follow-up confirmed this with microcytic anemia and low ferritin and 3/3 guaiac cards have been +.  She had been seen in office follow-up in past 2 years and it was decided not to pursue routine repeat colonoscopy (hx adenomatous polyps) due to co-morbidities.  Remains on 4L  O2 Allergies  Allergen Reactions  . Aspirin Shortness Of Breath  . Nsaids Shortness Of Breath   Outpatient Prescriptions Prior to Visit  Medication Sig Dispense Refill  . alendronate (FOSAMAX) 70 MG tablet Take 70 mg by mouth every 7 (seven) days. Take with a full glass of water on an empty stomach. Takes on Thursday.      . ALPRAZolam (XANAX) 0.5 MG tablet Take 0.5-1 mg by mouth at bedtime as needed. For anxiety      . benzonatate (TESSALON) 200 MG capsule Take 200 mg by mouth 3 (three) times daily as needed. For cough      . budesonide-formoterol (SYMBICORT) 160-4.5 MCG/ACT inhaler Inhale 2 puffs into the lungs 2 (two) times daily.        Marland Kitchen CALCIUM-VITAMIN D PO Take 1 tablet by mouth 2 (two) times daily.       . cyclobenzaprine (FLEXERIL) 10 MG tablet Take 10 mg by mouth at bedtime.       . docusate sodium (COLACE) 100 MG capsule Take 100 mg by mouth 2 (two) times daily.        . DULoxetine (CYMBALTA) 60 MG capsule Take 60 mg by mouth daily.        . furosemide (LASIX) 20 MG tablet Take 2 tablets (40 mg total) by mouth 2 (two) times daily.  60 tablet  6  . gabapentin (NEURONTIN) 600 MG tablet Take 600 mg by mouth 3 (three) times daily as needed. For pain      . hydrocodone-acetaminophen (LORCET-HD) 5-500 MG per capsule Take 1 capsule by mouth every 8 (eight) hours as needed. For pain      . insulin aspart (NOVOLOG) 100 UNIT/ML injection Inject 10 Units into the skin 2 (two) times daily.       .  insulin glargine (LANTUS) 100 UNIT/ML injection Inject 70 Units into the skin daily before breakfast.  10 mL    . iron polysaccharides (NIFEREX) 150 MG capsule Take 1 capsule (150 mg total) by mouth daily.  30 capsule  6  . levalbuterol (XOPENEX HFA) 45 MCG/ACT inhaler Inhale 1-2 puffs into the lungs every 4 (four) hours as needed for wheezing.  1 Inhaler  12  . Multiple Vitamin (MULTIVITAMIN) tablet Take 1 tablet by mouth daily.        . Omega-3 Fatty Acids (FISH OIL) 1000 MG CAPS Take 1 capsule by mouth daily.        Marland Kitchen omeprazole (PRILOSEC) 20 MG capsule Take 20 mg by mouth daily.        . potassium chloride (KLOR-CON) 10 MEQ CR tablet Take 20-40 mEq by mouth 2 (two) times daily. Takes in AM and in PM      . pravastatin (PRAVACHOL) 40 MG tablet Take 80 mg by mouth at bedtime.       . Rivaroxaban (XARELTO) 20 MG TABS Take 1 tablet (20 mg total) by mouth daily with supper.  30 tablet  6  . roflumilast (DALIRESP) 500 MCG TABS tablet Take 500 mcg by mouth daily.        Marland Kitchen tiotropium (SPIRIVA) 18 MCG inhalation capsule Place 18 mcg into inhaler and inhale daily.        . valsartan (DIOVAN) 160 MG tablet Take 320 mg by mouth daily.      . verapamil (VERELAN PM) 360 MG 24 hr capsule Take 360 mg by mouth daily.      Marland Kitchen amiodarone (PACERONE) 400 MG tablet Take 1 tablet (400 mg total) by mouth 2 (two) times daily. Take 1 pill twice a day X 7 days, then 1 pill daily  37 tablet  3   No facility-administered medications prior to visit.   Past Medical History  Diagnosis Date  . ALLERGIC RHINITIS   . Sleep apnea   . Hypertension   . Adenomatous colon polyp   . Gastritis   . External hemorrhoids   . Diverticulosis of colon   . Diabetes mellitus type 2 with neurological manifestations     On Insulin  . Depression   . Osteoporosis   . Morbidly obese   . Nephrolithiasis   . Back pain, chronic   . Osteoarthritis   . Anxiety   . Hyperlipemia   . Atrial tachycardia     Mostly Sinus  Tachycardia  . Asthma   . COPD mixed type     Requiring Home O2 at 4L  . Shortness of breath   . GERD (gastroesophageal reflux disease)   . Headache     headaches  . Neuromuscular disorder     diabetic neuropathy  . Fibromyalgia     "pain in arms and shoulders."  . Anemia     iron deficient  . Carotid stenosis   . A-fib    Past Surgical History  Procedure Laterality Date  . Appendectomy  1963  . Cholecystectomy  1963  . Abdominal hysterectomy  1978  . Total knee arthroplasty  1997    right  . Upper gastrointestinal endoscopy  04/09/2006    w/Dilation, gastritis  . Colonoscopy w/ biopsies and polypectomy  07/22/2007, 04/28/2007    2009: diverticulosis, 2008: diverticulosis, adenomatous polyps  . Tee without cardioversion  03/24/2012    Procedure: TRANSESOPHAGEAL ECHOCARDIOGRAM (TEE);  Surgeon: Chrystie Nose, MD;  Location: [facility] OR;  Service: Cardiovascular;  Laterality: N/A;  TEE with Bubble   History   Social History  . Marital Status: Married    Spouse Name: N/A    Number of Children: 2  . Years of Education: HS   Occupational History  . Disability    Social History Main Topics  . Smoking status: Former Smoker -- 2.00 packs/day for 30 years    Types: Cigarettes    Quit date: 06/02/1994  . Smokeless tobacco: Never Used  . Alcohol Use: No  . Drug Use: No  . Sexually Active: None   Other Topics Concern  . None   Social History Narrative   Daily caffeine    Family History  Problem Relation Age of Onset  . Heart disease Mother   . Heart disease Father   . Heart disease Brother   . Stroke Mother   . Stroke Brother   . Skin cancer Brother   . Colon cancer Neg Hx   . Colon polyps Sister   . Diabetes Sister   . Diabetes Brother   . Irritable bowel syndrome Daughter          Review  of Systems +DOE, orthopne, chronic back pain    Objective:   Physical Exam General:  Chronically ill on 4L [place] O2 Eyes:  anicteric. ENT:   Mouth and posterior pharynx  without ulcers, she is missing teeth and others in poor repair  Neck:   supple w/o thyromegaly or mass.  Lungs: Clear to auscultation bilaterally posterior with scattered anterior wheezes Heart:  S1S2, no rubs, murmurs, gallops. Abdomen:  obese, soft, non-tender, no hepatosplenomegaly, hernia, or mass and BS+.  Rectal: deferred Lymph:  no cervical or supraclavicular adenopathy. Extremities:   no edema Skin   no rash. Neuro:  A&O x 3.  Psych:  appropriate mood and  Affect.   Data Reviewed: Labs from Dr. Clelia Croft  Lab Results  Component Value Date   WBC 13.7* 06/01/2012   HGB 9.9* 06/01/2012   HCT 33.4* 06/01/2012   MCV 69.3* 06/01/2012   PLT 322 06/01/2012   Lab Results  Component Value Date   FERRITIN 32 05/29/2012         Assessment & Plan:  Anemia, iron deficiency  Heme + stool  Long term (current) use of anticoagulants  Atrial fibrillation  Cor pulmonale, chronic O2  Hx of cecal adenoma  She could have a recurrence of cecal adenoma, new polyps, carcinoma of GI tract, other bleeding lesions like AVM's. Given chronic anticoagulation she is at higher risk of losing blood from any lesion. She is complicated and a classIV ASA patient with increased risk of complications from sedtion and endoscopy. However - has anemia presumed from chronic blood loss and this affects her functional status so endoscopic investigation is appropriate. I think holding Xarelto 48 hrs prior to procedures appropriate overall. Will check with her cardiologist Dr. Herbie Baltimore.  1. Schedule egd and colonoscopy at [facility] The risks and benefits as well as alternatives of endoscopic procedure(s) have been discussed and reviewed. All questions answered. The patient agrees to proceed. Increased risk of stroke off xarelto reviewed with patient and husband. Further plans pending evaluation  I appreciate the opportunity to care for this patient.  ZO:XWRU,E Riley Lam, MD, Bryan Lemma, MD

## 2012-10-01 NOTE — Anesthesia Postprocedure Evaluation (Signed)
Anesthesia Post Note  Patient: Destiny Shelton  Procedure(s) Performed: Procedure(s) (LRB): ESOPHAGOGASTRODUODENOSCOPY (EGD) WITH PROPOFOL (N/A) COLONOSCOPY WITH PROPOFOL (N/A)  Anesthesia type: MAC  Patient location: PACU  Post pain: Pain level controlled  Post assessment: Post-op Vital signs reviewed  Last Vitals:  Filed Vitals:   10/01/12 1020  BP: 141/70  Temp:   Resp: 17    Post vital signs: Reviewed  Level of consciousness: sedated  Complications: No apparent anesthesia complications

## 2012-10-01 NOTE — Op Note (Signed)
[facility] [address] [place], [address]   ENDOSCOPY PROCEDURE REPORT  PATIENT: Destiny Shelton, Destiny Shelton.  MR#: [id number] BIRTHDATE: 1946-04-24 , 66  yrs. old GENDER: Female ENDOSCOPIST: Iva Boop, MD, Clementeen Graham REFERRED BY:  Kari Baars, M.D. PROCEDURE DATE:  10/01/2012 PROCEDURE:  EGD w/ biopsy ASA CLASS:     Class IV INDICATIONS:  Iron deficiency anemia. MEDICATIONS: See Anesthesia Report. TOPICAL ANESTHETIC: none  DESCRIPTION OF PROCEDURE: After the risks benefits and alternatives of the procedure were thoroughly explained, informed consent was obtained.  The Pentax Gastroscope [id number] endoscope was introduced through the mouth and advanced to the second portion of the duodenum. Without limitations.  The instrument was slowly withdrawn as the mucosa was fully examined.       ESOPHAGUS: A mildly severe Schatzki ring was found.  Multiple biopsies were performed using cold forceps.  Sample sent for histology.  STOMACH: A polypoid shaped sessile polyp measuring 5 mm in size was found in the gastric body.  A biopsy was performed using cold forceps.  Sample sent for histology.  The remainder of the upper endoscopy exam was otherwise normal. Retroflexed views revealed no abnormalities.     The scope was then withdrawn from the patient and the procedure completed.  COMPLICATIONS: There were no complications. ENDOSCOPIC IMPRESSION: 1.   Schatzki ring was found; multiple biopsies 2.   Sessile polyp measuring 5 mm in size was found in the gastric body 3.   The remainder of the upper endoscopy exam was otherwise normal  RECOMMENDATIONS: 1.  Await pathology results 2.  Proceed with a Colonoscopy. eSigned:  Iva Boop, MD, [facility] 10/01/2012 10:11 AM CC:W.  Buren Kos, MD

## 2012-10-01 NOTE — Interval H&P Note (Signed)
History and Physical Interval Note:  10/01/2012 7:34 AM  Destiny Shelton  has presented today for surgery, with the diagnosis of Anemia [285.9] Heme + stool [792.1]  The various methods of treatment have been discussed with the patient and family. After consideration of risks, benefits and other options for treatment, the patient has consented to  Procedure(s): ESOPHAGOGASTRODUODENOSCOPY (EGD) WITH PROPOFOL (N/A) COLONOSCOPY WITH PROPOFOL (N/A) as a surgical intervention .  The patient's history has been reviewed, patient examined, no change in status, stable for surgery.  I have reviewed the patient's chart and labs.  Questions were answered to the patient's satisfaction.     Iva Boop, MD, [facility] Graham

## 2012-10-02 DIAGNOSIS — D509 Iron deficiency anemia, unspecified: Secondary | ICD-10-CM | POA: Diagnosis not present

## 2012-10-02 DIAGNOSIS — D131 Benign neoplasm of stomach: Secondary | ICD-10-CM | POA: Diagnosis not present

## 2012-10-02 DIAGNOSIS — D126 Benign neoplasm of colon, unspecified: Secondary | ICD-10-CM | POA: Diagnosis not present

## 2012-10-02 DIAGNOSIS — E876 Hypokalemia: Secondary | ICD-10-CM | POA: Diagnosis not present

## 2012-10-02 LAB — CBC
Platelets: 252 10*3/uL (ref 150–400)
RBC: 4.04 MIL/uL (ref 3.87–5.11)
WBC: 10.3 10*3/uL (ref 4.0–10.5)

## 2012-10-02 LAB — BASIC METABOLIC PANEL
CO2: 31 mEq/L (ref 19–32)
Calcium: 8.5 mg/dL (ref 8.4–10.5)
Chloride: 103 mEq/L (ref 96–112)
GFR calc Af Amer: >90 mL/min (ref >90)
Sodium: 141 mEq/L (ref 135–145)

## 2012-10-02 LAB — GLUCOSE, CAPILLARY: Glucose-Capillary: 77 mg/dL (ref 70–99)

## 2012-10-02 MED ORDER — POTASSIUM CHLORIDE CRYS ER 20 MEQ PO TBCR
40.0000 meq | EXTENDED_RELEASE_TABLET | Freq: Once | ORAL | Status: DC
  Filled 2012-10-02: qty 2

## 2012-10-02 MED ORDER — RIVAROXABAN 20 MG PO TABS: 20.0000 mg | ORAL_TABLET | Freq: Every day | ORAL | Status: DC

## 2012-10-02 NOTE — Discharge Summary (Signed)
Physician Discharge Summary  Patient ID: Destiny Shelton MRN: [id number] DOB/AGE: [birth year] 67 y.o.  Admit date: 10/01/2012 Discharge date: 10/02/2012  Admission Diagnoses: Observation for bleeding after colon polypectomy  Discharge Diagnoses:  Active Problems:   Benign neoplasm of colon   Gastric polyp   Schatzki's ring   Hypokalemia   Discharged Condition: Good  Hospital Course: Did well with no bleeding. Hgb stable at 10 after initial 12 - think was hemoconcentrated. Hypokalemia to be expected also - this was treated with extra supplementation. Xarelto was held.  Consults: None    Treatments: insulin: Lantus  Discharge Exam: Blood pressure 88/49, pulse 91, temperature 97.9 F (36.6 C), temperature source Oral, resp. rate 20, height 5\' 5"  (1.651 m), weight 221 lb 1.9 oz (100.3 kg), SpO2 90.00%. NAD  Disposition: 06-Home-Health Care Svc   Future Appointments Provider Department Dept Phone   10/14/2012 10:45 AM Barbaraann Share, MD [facility] [phone]       Medication List    STOP taking these medications       Na Sulfate-K Sulfate-Mg Sulf Soln  Commonly known as:  SUPREP BOWEL PREP      TAKE these medications       alendronate 70 MG tablet  Commonly known as:  FOSAMAX  Take 70 mg by mouth every 7 (seven) days. Take with a full glass of water on an empty stomach.  Takes on Thursday.     ALPRAZolam 0.5 MG tablet  Commonly known as:  XANAX  Take 0.5-1 mg by mouth at bedtime as needed. For anxiety     benzonatate 200 MG capsule  Commonly known as:  TESSALON  Take 200 mg by mouth 3 (three) times daily as needed. For cough     budesonide-formoterol 160-4.5 MCG/ACT inhaler  Commonly known as:  SYMBICORT  Inhale 2 puffs into the lungs 2 (two) times daily.     CALCIUM-VITAMIN D PO  Take 1 tablet by mouth 2 (two) times daily.     cyclobenzaprine 10 MG tablet  Commonly known as:  FLEXERIL  Take 10 mg by mouth at bedtime.     DALIRESP 500 MCG  Tabs tablet  Generic drug:  roflumilast  Take 500 mcg by mouth daily.     docusate sodium 100 MG capsule  Commonly known as:  COLACE  Take 100 mg by mouth 2 (two) times daily.     DULoxetine 60 MG capsule  Commonly known as:  CYMBALTA  Take 60 mg by mouth daily.     Fish Oil 1000 MG Caps  Take 1 capsule by mouth daily.     furosemide 20 MG tablet  Commonly known as:  LASIX  Take 2 tablets (40 mg total) by mouth 2 (two) times daily.     gabapentin 600 MG tablet  Commonly known as:  NEURONTIN  Take 600 mg by mouth 3 (three) times daily as needed. For pain     hydrocodone-acetaminophen 5-500 MG per capsule  Commonly known as:  LORCET-HD  Take 1 capsule by mouth every 8 (eight) hours as needed. For pain     insulin glargine 100 UNIT/ML injection  Commonly known as:  LANTUS  Inject 70 Units into the skin daily before breakfast.     iron polysaccharides 150 MG capsule  Commonly known as:  NIFEREX  Take 1 capsule (150 mg total) by mouth daily.     levalbuterol 45 MCG/ACT inhaler  Commonly known as:  XOPENEX HFA  Inhale 1-2 puffs  into the lungs every 4 (four) hours as needed for wheezing.     multivitamin tablet  Take 1 tablet by mouth daily.     NOVOLOG 100 UNIT/ML injection  Generic drug:  insulin aspart  Inject 10 Units into the skin 2 (two) times daily.     omeprazole 20 MG capsule  Commonly known as:  PRILOSEC  Take 20 mg by mouth daily.     potassium chloride 10 MEQ CR tablet  Commonly known as:  KLOR-CON  Take 20-40 mEq by mouth 2 (two) times daily. Takes in AM and in PM     pravastatin 40 MG tablet  Commonly known as:  PRAVACHOL  Take 80 mg by mouth at bedtime.     Rivaroxaban 20 MG Tabs  Commonly known as:  XARELTO  Take 1 tablet (20 mg total) by mouth daily with supper. DO NOT TAKE AGAIN UNTIL MAY 7  Start taking on:  10/06/2012     tiotropium 18 MCG inhalation capsule  Commonly known as:  SPIRIVA  Place 18 mcg into inhaler and inhale  daily.     valsartan 160 MG tablet  Commonly known as:  DIOVAN  Take 320 mg by mouth daily.     verapamil 360 MG 24 hr capsule  Commonly known as:  VERELAN PM  Take 360 mg by mouth daily.           Follow-up Information   Follow up with Stan Head, MD. (Dr. Marvell Fuller office will call to arrange)    Contact information:   [address] [place] [address] [phone]       Signed: Stan Head, MD, [facility] 10/02/2012, 2:22 PM

## 2012-10-02 NOTE — Progress Notes (Signed)
Patient was provided discharge instructions. Follow up appointment in place for [facility] and instructions given to call Dr. Marvell Fuller office for follow appointment. No prescriptions given. Patient and spouse verbalize understanding of discharge instructions. Patient taken down to personal vehicle by nursing staff.

## 2012-10-04 ENCOUNTER — Encounter (HOSPITAL_COMMUNITY): Payer: Self-pay | Admitting: Internal Medicine

## 2012-10-06 ENCOUNTER — Encounter: Payer: Self-pay | Admitting: Internal Medicine

## 2012-10-06 NOTE — Progress Notes (Signed)
Quick Note:  Polyps all benign - (adenomas, TV adenomas) - please call her One of the larger polyps that was not completely removed shows some high-grade dysplasia - very small area- indicating early changes towards cancer Does not mean cancer coming soon in this but I want her to schedule a follow-up office visit in June or July to discuss this and possible follow-up treatment Place an August sigmoidoscopy recall also Letter sent for completeness also I am ccing Dr. Clelia Croft  ______

## 2012-10-14 ENCOUNTER — Ambulatory Visit (INDEPENDENT_AMBULATORY_CARE_PROVIDER_SITE_OTHER): Payer: Medicare Other | Admitting: Pulmonary Disease

## 2012-10-14 ENCOUNTER — Encounter: Payer: Self-pay | Admitting: Pulmonary Disease

## 2012-10-14 VITALS — BP 132/60 | HR 106 | Temp 98.4°F | Ht 65.0 in | Wt 228.4 lb

## 2012-10-14 DIAGNOSIS — R0602 Shortness of breath: Secondary | ICD-10-CM

## 2012-10-14 DIAGNOSIS — J449 Chronic obstructive pulmonary disease, unspecified: Secondary | ICD-10-CM | POA: Diagnosis not present

## 2012-10-14 MED ORDER — TIOTROPIUM BROMIDE MONOHYDRATE 18 MCG IN CAPS: 18.0000 ug | ORAL_CAPSULE | Freq: Every day | RESPIRATORY_TRACT | Status: DC

## 2012-10-14 MED ORDER — BUDESONIDE-FORMOTEROL FUMARATE 160-4.5 MCG/ACT IN AERO: 2.0000 | INHALATION_SPRAY | Freq: Two times a day (BID) | RESPIRATORY_TRACT | Status: DC

## 2012-10-14 NOTE — Assessment & Plan Note (Addendum)
The patient has mild airflow obstruction, and the aggressiveness of her pulmonary medication is out of proportion to this finding.  However, the patient clearly feels that she has benefited from her current regimen, and would like to continue.  She has excellent airflow on exam today, and no bronchospasm.   As far as her cough and mucus production, her history is more consistent with an allergic rhinitis rather than acute bronchitis.  I have asked her to try Zyrtec over-the-counter, and call us if she is not getting better.  She may need an antibiotic at that point.

## 2012-10-14 NOTE — Assessment & Plan Note (Signed)
The patient continues to have chronic respiratory failure and dyspnea on exertion that is clearly multifactorial.  All of her different issues are being addressed, but I have stressed to her the importance of aggressive weight loss.  Again, I am convinced that she has some type of a shunt that is causing her hypoxemia, but we have not been able to identify the location.

## 2012-10-14 NOTE — Progress Notes (Signed)
  Subjective:    Patient ID: Destiny Shelton, female    DOB: 1945-12-10, 67 y.o.   MRN: [id number]  HPI The patient comes in today for followup of her multifactorial chronic respiratory failure.  She has mild COPD by pulmonary function studies, significant diastolic dysfunction with elevated left sided heart pressures, and most likely either an intrapulmonary or intracardiac shunt that we have yet to identify.  She is maintained on her pulmonary medications, as well as oxygen.  She feels that she has done well until the last few weeks, when she began to have nasal congestion with postnasal drip and cough.  She is unsure if any of the mucous is coming from her chest.  It is primarily quite, but it is discolored at times.  She is not trying an over the counter antihistamine.   Review of Systems  Constitutional: Negative for fever and unexpected weight change.  HENT: Positive for congestion. Negative for ear pain, nosebleeds, sore throat, rhinorrhea, sneezing, trouble swallowing, dental problem, postnasal drip and sinus pressure.   Eyes: Negative for redness and itching.  Respiratory: Positive for cough, shortness of breath and wheezing. Negative for chest tightness.   Cardiovascular: Negative for palpitations and leg swelling.  Gastrointestinal: Negative for nausea and vomiting.  Genitourinary: Negative for dysuria.  Musculoskeletal: Negative for joint swelling.  Skin: Negative for rash.  Neurological: Negative for headaches.  Hematological: Does not bruise/bleed easily.  Psychiatric/Behavioral: Negative for dysphoric mood. The patient is not nervous/anxious.        Objective:   Physical Exam Obese female in no acute distress Nose without purulence or discharge noted Oropharynx clear Neck without lymphadenopathy or thyromegaly Chest with mildly decreased breath sounds, no crackles or wheezes Cardiac exam with regular rate and rhythm, 2/6 systolic murmur Lower extremities with mild edema,  no cyanosis Alert and oriented, moves all 4 extremities.       Assessment & Plan:

## 2012-10-14 NOTE — Patient Instructions (Addendum)
Try zyrtec 10mg  at bedtime for allergies.  If your congestion is not improving, and certainly if worsening, we can prescribe antibiotic.  Continue on your oxygen and breathing medications Watch fluid balance carefully under the guidance of cardiology Work on weight loss followup with me in 6mos if stable.

## 2012-10-20 DIAGNOSIS — J449 Chronic obstructive pulmonary disease, unspecified: Secondary | ICD-10-CM | POA: Diagnosis not present

## 2012-10-20 DIAGNOSIS — E785 Hyperlipidemia, unspecified: Secondary | ICD-10-CM | POA: Diagnosis not present

## 2012-10-20 DIAGNOSIS — E1149 Type 2 diabetes mellitus with other diabetic neurological complication: Secondary | ICD-10-CM | POA: Diagnosis not present

## 2012-10-20 DIAGNOSIS — G609 Hereditary and idiopathic neuropathy, unspecified: Secondary | ICD-10-CM | POA: Diagnosis not present

## 2012-10-20 DIAGNOSIS — I4891 Unspecified atrial fibrillation: Secondary | ICD-10-CM | POA: Diagnosis not present

## 2012-10-20 DIAGNOSIS — I2789 Other specified pulmonary heart diseases: Secondary | ICD-10-CM | POA: Diagnosis not present

## 2012-10-20 DIAGNOSIS — I10 Essential (primary) hypertension: Secondary | ICD-10-CM | POA: Diagnosis not present

## 2012-10-20 DIAGNOSIS — I509 Heart failure, unspecified: Secondary | ICD-10-CM | POA: Diagnosis not present

## 2012-10-26 ENCOUNTER — Telehealth: Payer: Self-pay | Admitting: Pulmonary Disease

## 2012-10-26 MED ORDER — CEFDINIR 300 MG PO CAPS: 600.0000 mg | ORAL_CAPSULE | Freq: Every day | ORAL | Status: DC

## 2012-10-26 NOTE — Telephone Encounter (Signed)
Ok to call in [place] 300mg , take 2 each am for 7 days.  She is to call us if not doing better by next week.

## 2012-10-26 NOTE — Telephone Encounter (Signed)
Spoke to pt. She states that she has been taking Zyrtec for 2 weeks per [facility] recommendations. This has given her little relief. Reports coughing with production of green mucus and DOE. Denies fever/chills or chest tightness. Wants something called in to her pharmacy.  Allergies  Allergen Reactions  . Aspirin Shortness Of Breath  . Nsaids Shortness Of Breath    KC please advise. Thanks.

## 2012-10-26 NOTE — Telephone Encounter (Signed)
Pt is aware of KC recommendation. Rx will be sent to [facility] per her request.

## 2012-10-27 ENCOUNTER — Ambulatory Visit: Payer: Medicare Other | Admitting: Internal Medicine

## 2012-11-16 DIAGNOSIS — M949 Disorder of cartilage, unspecified: Secondary | ICD-10-CM | POA: Diagnosis not present

## 2012-11-23 ENCOUNTER — Encounter: Payer: Self-pay | Admitting: Internal Medicine

## 2012-11-23 ENCOUNTER — Ambulatory Visit (INDEPENDENT_AMBULATORY_CARE_PROVIDER_SITE_OTHER): Payer: Medicare Other | Admitting: Internal Medicine

## 2012-11-23 ENCOUNTER — Telehealth: Payer: Self-pay

## 2012-11-23 ENCOUNTER — Other Ambulatory Visit (INDEPENDENT_AMBULATORY_CARE_PROVIDER_SITE_OTHER): Payer: Medicare Other

## 2012-11-23 VITALS — BP 120/60 | HR 120 | Ht 65.0 in | Wt 227.0 lb

## 2012-11-23 DIAGNOSIS — I4891 Unspecified atrial fibrillation: Secondary | ICD-10-CM

## 2012-11-23 DIAGNOSIS — D509 Iron deficiency anemia, unspecified: Secondary | ICD-10-CM

## 2012-11-23 DIAGNOSIS — D126 Benign neoplasm of colon, unspecified: Secondary | ICD-10-CM | POA: Diagnosis not present

## 2012-11-23 DIAGNOSIS — Z7901 Long term (current) use of anticoagulants: Secondary | ICD-10-CM

## 2012-11-23 LAB — CBC WITH DIFFERENTIAL/PLATELET
Basophils Relative: 0.7 % (ref 0.0–3.0)
Eosinophils Relative: 3.5 % (ref 0.0–5.0)
Hemoglobin: 10.8 g/dL — ABNORMAL LOW (ref 12.0–15.0)
Lymphocytes Relative: 26 % (ref 12.0–46.0)
MCHC: 32 g/dL (ref 30.0–36.0)
Monocytes Relative: 11.6 % (ref 3.0–12.0)
Neutro Abs: 4.8 10*3/uL (ref 1.4–7.7)
RBC: 4.45 Mil/uL (ref 3.87–5.11)
WBC: 8.2 10*3/uL (ref 4.5–10.5)

## 2012-11-23 MED ORDER — NA SULFATE-K SULFATE-MG SULF 17.5-3.13-1.6 GM/177ML PO SOLN: ORAL | Status: DC

## 2012-11-23 NOTE — Telephone Encounter (Signed)
The patient has been notified of this information and all questions answered.

## 2012-11-23 NOTE — Progress Notes (Signed)
Subjective:    Patient ID: Destiny Shelton, female    DOB: September 06, 1945, 67 y.o.   MRN: [id number]  HPI The patient returns with her husband. She is stable without any worsening of her dyspnea or other health status. In may have performed a colonoscopy and upper GI endoscopy because of recurrent iron deficiency anemia and heme positive stool. On her colonoscopy she had 14 polyps, a large sigmoid polyp might represent a recurrence of previous polyp and it had high-grade dysplasia and a focus, in the pieces of polyp removed and submitted to pathology. She has not noted any bleeding. She remained somewhat constipated she remains on iron and she uses narcotic pain relievers at times. It is not clear if the MiraLax is helping on a single dose a day she said. Allergies  Allergen Reactions  . Aspirin Shortness Of Breath  . Nsaids Shortness Of Breath   Outpatient Prescriptions Prior to Visit  Medication Sig Dispense Refill  . alendronate (FOSAMAX) 70 MG tablet Take 70 mg by mouth every 7 (seven) days. Take with a full glass of water on an empty stomach. Takes on Thursday.      . ALPRAZolam (XANAX) 0.5 MG tablet Take 0.5-1 mg by mouth at bedtime as needed. For anxiety      . benzonatate (TESSALON) 200 MG capsule Take 200 mg by mouth 3 (three) times daily as needed. For cough      . budesonide-formoterol (SYMBICORT) 160-4.5 MCG/ACT inhaler Inhale 2 puffs into the lungs 2 (two) times daily.  1 Inhaler  6  . CALCIUM-VITAMIN D PO Take 1 tablet by mouth 2 (two) times daily.       . cyclobenzaprine (FLEXERIL) 10 MG tablet Take 10 mg by mouth at bedtime.       . docusate sodium (COLACE) 100 MG capsule Take 100 mg by mouth 2 (two) times daily.        . DULoxetine (CYMBALTA) 60 MG capsule Take 60 mg by mouth daily.        . furosemide (LASIX) 20 MG tablet Take 2 tablets (40 mg total) by mouth 2 (two) times daily.  60 tablet  6  . gabapentin (NEURONTIN) 600 MG tablet Take 600 mg by mouth 3 (three) times daily as  needed. For pain      . hydrocodone-acetaminophen (LORCET-HD) 5-500 MG per capsule Take 1 capsule by mouth every 8 (eight) hours as needed. For pain      . insulin aspart (NOVOLOG) 100 UNIT/ML injection Inject 10 Units into the skin 2 (two) times daily.       . insulin glargine (LANTUS) 100 UNIT/ML injection Inject 70 Units into the skin daily before breakfast.  10 mL    . iron polysaccharides (NIFEREX) 150 MG capsule Take 1 capsule (150 mg total) by mouth daily.  30 capsule  6  . levalbuterol (XOPENEX HFA) 45 MCG/ACT inhaler Inhale 1-2 puffs into the lungs every 4 (four) hours as needed for wheezing.  1 Inhaler  12  . Multiple Vitamin (MULTIVITAMIN) tablet Take 1 tablet by mouth daily.        . Omega-3 Fatty Acids (FISH OIL) 1000 MG CAPS Take 1 capsule by mouth daily.        Marland Kitchen omeprazole (PRILOSEC) 20 MG capsule Take 20 mg by mouth daily.        . potassium chloride (KLOR-CON) 10 MEQ CR tablet Take 20-40 mEq by mouth 2 (two) times daily. Takes in AM and in PM      .  pravastatin (PRAVACHOL) 40 MG tablet Take 80 mg by mouth at bedtime.       . Rivaroxaban (XARELTO) 20 MG TABS Take 20 mg by mouth daily with supper.      . roflumilast (DALIRESP) 500 MCG TABS tablet Take 500 mcg by mouth daily.       Marland Kitchen tiotropium (SPIRIVA) 18 MCG inhalation capsule Place 1 capsule (18 mcg total) into inhaler and inhale daily.  30 capsule  6  . valsartan (DIOVAN) 160 MG tablet Take 320 mg by mouth daily.      . verapamil (VERELAN PM) 360 MG 24 hr capsule Take 360 mg by mouth daily.      . cefdinir (OMNICEF) 300 MG capsule Take 2 capsules (600 mg total) by mouth daily. For 7 days  14 capsule  0   No facility-administered medications prior to visit.   Past Medical History  Diagnosis Date  . ALLERGIC RHINITIS   . Hypertension   . Adenomatous colon polyp   . Gastritis   . External hemorrhoids   . Diverticulosis of colon   . Diabetes mellitus type 2 with neurological manifestations     On Insulin  .  Depression   . Osteoporosis   . Morbidly obese   . Back pain, chronic   . Anxiety   . Hyperlipemia   . COPD (chronic obstructive pulmonary disease)     Requiring Home O2 at 4L  . GERD (gastroesophageal reflux disease)   . Neuromuscular disorder     diabetic neuropathy  . Carotid stenosis   . Urosepsis 05/26/2012  . Atrial tachycardia     Mostly Sinus Tachycardia  . A-fib   . Osteoarthritis   . Fibromyalgia     "pain in arms and shoulders."  . Sleep apnea     no cpcp machine 02 at 4l/min  . Nephrolithiasis   . Headache(784.0)   . Anemia     iron deficient   Past Surgical History  Procedure Laterality Date  . Appendectomy  1963  . Cholecystectomy  1963  . Abdominal hysterectomy  1978  . Total knee arthroplasty  1997    right  . Upper gastrointestinal endoscopy  04/09/2006    w/Dilation, gastritis  . Colonoscopy w/ biopsies and polypectomy  07/22/2007, 04/28/2007    2009: diverticulosis, 2008: diverticulosis, adenomatous polyps  . Tee without cardioversion  03/24/2012    Procedure: TRANSESOPHAGEAL ECHOCARDIOGRAM (TEE);  Surgeon: Chrystie Nose, MD;  Location: [facility] OR;  Service: Cardiovascular;  Laterality: N/A;  TEE with Bubble  . Esophagogastroduodenoscopy (egd) with propofol N/A 10/01/2012    Procedure: ESOPHAGOGASTRODUODENOSCOPY (EGD) WITH PROPOFOL;  Surgeon: Iva Boop, MD;  Location: [facility] ENDOSCOPY;  Service: Endoscopy;  Laterality: N/A;  . Colonoscopy with propofol N/A 10/01/2012    Procedure: COLONOSCOPY WITH PROPOFOL;  Surgeon: Iva Boop, MD;  Location: [facility] ENDOSCOPY;  Service: Endoscopy;  Laterality: N/A;   History   Social History  . Marital Status: Married    Spouse Name: N/A    Number of Children: 2  . Years of Education: HS   Occupational History  . Disability    Social History Main Topics  . Smoking status: Former Smoker -- 2.00 packs/day for 30 years    Types: Cigarettes    Quit date: 06/02/1994  . Smokeless tobacco: Never Used  . Alcohol Use:  No  . Drug Use: No  . Sexually Active: No          Social History Narrative  Daily caffeine    Family History  Problem Relation Age of Onset  . Heart disease Mother   . Heart disease Father   . Heart disease Brother   . Stroke Mother   . Stroke Brother   . Skin cancer Brother   . Colon cancer Neg Hx   . Colon polyps Sister   . Diabetes Sister   . Diabetes Brother   . Irritable bowel syndrome Daughter     Review of Systems On home oxygen, as above    Objective:   Physical Exam General:  NAD, she is chronically ill and on 4 L nasal cannula oxygen Eyes:   anicteric Lungs:  clear Heart:  S1S2 no rubs, murmurs or gallops   Data Reviewed:  Colonoscopy and upper GI endoscopy from May 2014 as well as pathology, colonoscopy pathology described above she had a fundic gland polyp in her stomach Lab Results  Component Value Date   HGB 10.1* 10/02/2012      Assessment & Plan:   1. Benign neoplasm of colon - adenoma w/ high-grade dysplasia not completely removed   2. Atrial fibrillation   3. Long term (current) use of anticoagulants - Xarelto   4. Anemia, iron deficiency   5. Constipation on iron and narcotics 1. See problem oriented charting as well 2. Have decided to pursue a repeat colonoscopy also relatively check her CBC today. She will increase MiraLax to twice a day help her constipation.

## 2012-11-23 NOTE — Telephone Encounter (Signed)
[facility]   [address] [place]. [address]     11/23/2012    RE: Destiny Shelton DOB: 1945-08-21 MRN: [id number]   Dear Dr. Herbie Baltimore,    We have scheduled the above patient for an endoscopic procedure. Our records show that she is on anticoagulation therapy.   Please advise as to how long the patient may come off her therapy of Xarelto prior to the colonoscopy procedure, which is scheduled for December 21 2012.  Please fax back/ or route the completed form to Myra Weng [place], CMA at [phone].   Sincerely,  [place], Clayborne Dana

## 2012-11-23 NOTE — Assessment & Plan Note (Signed)
She needs completion removal/ablation of the sigmoid polyp that was large and had high-grade dysplasia I think. In the past I had recommended she not undergo routine repeat colonoscopies but given these findings and her iron deficiency anemia issues we'll go ahead and plan for a repeat colonoscopy, holding her Xarelto for 48 hours beforehand assuming her cardiologist approves as he did in May. I've explained the risks benefits and indications of colonoscopy and repeat polypectomy and BX for risk of possible stroke while off her Xarelto. Lab anesthesia assistance this case is she is one was significant comorbidities but fortunately she came to the last procedure okay. I'm concerned that she could eventually develop cancer in his polyp or have recurrent blood loss and persistent anemia problems.

## 2012-11-23 NOTE — Assessment & Plan Note (Signed)
There is a persistent sigmoid adenoma that had high-grade dysplasia in the polyps removed, it was not completely removed so we will plan to repeat a colonoscopy and removed this polyp and look for other polyps given that she had 14 on her previous exam she may have other ones I did not see.The risks and benefits as well as alternatives of endoscopic procedure(s) have been discussed and reviewed. All questions answered. The patient agrees to proceed. Extra risk of stroke off Xarelto explained to the patient and her husband and they understand and agree to proceed, we will obtain clearance from her cardiologist regarding holding the Xarelto. Low threshold these clips for hemostasis prophylaxis.

## 2012-11-23 NOTE — Patient Instructions (Addendum)
Your physician has requested that you go to the basement for the following lab work before leaving today: CBC/diff  You have been scheduled for a colonoscopy with propofol. Please follow written instructions given to you at your visit today.  Please pick up your prep kit at the pharmacy within the next 1-3 days. If you use inhalers (even only as needed), please bring them with you on the day of your procedure. Your physician has requested that you go to www.startemmi.com and enter the access code given to you at your visit today. This web site gives a general overview about your procedure. However, you should still follow specific instructions given to you by our office regarding your preparation for the procedure.  Please increase your Miralax to twice a day.   You will be contaced by our office prior to your procedure for directions on holding your Xarelto. If you do not hear from our office 1 week prior to your scheduled procedure, please call [phone] to discuss.  I appreciate the opportunity to care for you.

## 2012-11-23 NOTE — Progress Notes (Signed)
Quick Note:  Hgb is slightly better Stay on iron I will copy Dr. Clelia Croft ______

## 2012-11-23 NOTE — Telephone Encounter (Signed)
It would be fine to stop Xarelto 3 doses prior to her procedure & restart the following day.  Marykay Lex, MD

## 2012-11-30 ENCOUNTER — Encounter (HOSPITAL_COMMUNITY): Payer: Self-pay | Admitting: *Deleted

## 2012-11-30 ENCOUNTER — Other Ambulatory Visit: Payer: Self-pay | Admitting: Cardiology

## 2012-11-30 ENCOUNTER — Other Ambulatory Visit: Payer: Self-pay | Admitting: *Deleted

## 2012-11-30 ENCOUNTER — Encounter (HOSPITAL_COMMUNITY): Payer: Self-pay | Admitting: Pharmacy Technician

## 2012-11-30 MED ORDER — VERAPAMIL HCL ER 360 MG PO CP24: 360.0000 mg | ORAL_CAPSULE | Freq: Every day | ORAL | Status: DC

## 2012-11-30 NOTE — Progress Notes (Signed)
08-26-12 lov dr harding cardiology on chart Echo 12-31-11 dr harding on chart Tee 03-22-12 dr harding on chart ekg 08-26-12 dr harding on chart  cardiac Cath 12-19-11 dr harding on chart

## 2012-12-21 ENCOUNTER — Ambulatory Visit (HOSPITAL_COMMUNITY): Payer: Medicare Other | Admitting: Anesthesiology

## 2012-12-21 ENCOUNTER — Ambulatory Visit (HOSPITAL_COMMUNITY)
Admission: RE | Admit: 2012-12-21 | Discharge: 2012-12-21 | Disposition: A | Discharge status: Home / Self Care | Payer: Medicare Other | Source: Ambulatory Visit | Attending: Internal Medicine | Admitting: Internal Medicine

## 2012-12-21 ENCOUNTER — Encounter (HOSPITAL_COMMUNITY): Payer: Self-pay | Admitting: Anesthesiology

## 2012-12-21 ENCOUNTER — Encounter (HOSPITAL_COMMUNITY): Payer: Self-pay | Admitting: *Deleted

## 2012-12-21 ENCOUNTER — Encounter (HOSPITAL_COMMUNITY)
Admission: RE | Disposition: A | Discharge status: Home / Self Care | Payer: Self-pay | Source: Ambulatory Visit | Attending: Internal Medicine

## 2012-12-21 DIAGNOSIS — Z96659 Presence of unspecified artificial knee joint: Secondary | ICD-10-CM | POA: Diagnosis not present

## 2012-12-21 DIAGNOSIS — D378 Neoplasm of uncertain behavior of other specified digestive organs: Secondary | ICD-10-CM | POA: Diagnosis not present

## 2012-12-21 DIAGNOSIS — I10 Essential (primary) hypertension: Secondary | ICD-10-CM | POA: Diagnosis not present

## 2012-12-21 DIAGNOSIS — M810 Age-related osteoporosis without current pathological fracture: Secondary | ICD-10-CM | POA: Diagnosis not present

## 2012-12-21 DIAGNOSIS — R29818 Other symptoms and signs involving the nervous system: Secondary | ICD-10-CM | POA: Insufficient documentation

## 2012-12-21 DIAGNOSIS — K5900 Constipation, unspecified: Secondary | ICD-10-CM | POA: Diagnosis not present

## 2012-12-21 DIAGNOSIS — Z7901 Long term (current) use of anticoagulants: Secondary | ICD-10-CM | POA: Diagnosis not present

## 2012-12-21 DIAGNOSIS — D126 Benign neoplasm of colon, unspecified: Secondary | ICD-10-CM | POA: Diagnosis not present

## 2012-12-21 DIAGNOSIS — E785 Hyperlipidemia, unspecified: Secondary | ICD-10-CM | POA: Insufficient documentation

## 2012-12-21 DIAGNOSIS — Z794 Long term (current) use of insulin: Secondary | ICD-10-CM | POA: Diagnosis not present

## 2012-12-21 DIAGNOSIS — J4489 Other specified chronic obstructive pulmonary disease: Secondary | ICD-10-CM | POA: Insufficient documentation

## 2012-12-21 DIAGNOSIS — I4891 Unspecified atrial fibrillation: Secondary | ICD-10-CM | POA: Diagnosis not present

## 2012-12-21 DIAGNOSIS — D371 Neoplasm of uncertain behavior of stomach: Secondary | ICD-10-CM | POA: Diagnosis not present

## 2012-12-21 DIAGNOSIS — IMO0001 Reserved for inherently not codable concepts without codable children: Secondary | ICD-10-CM | POA: Insufficient documentation

## 2012-12-21 DIAGNOSIS — K5730 Diverticulosis of large intestine without perforation or abscess without bleeding: Secondary | ICD-10-CM | POA: Diagnosis not present

## 2012-12-21 DIAGNOSIS — D509 Iron deficiency anemia, unspecified: Secondary | ICD-10-CM | POA: Diagnosis not present

## 2012-12-21 DIAGNOSIS — E1169 Type 2 diabetes mellitus with other specified complication: Secondary | ICD-10-CM | POA: Insufficient documentation

## 2012-12-21 DIAGNOSIS — J449 Chronic obstructive pulmonary disease, unspecified: Secondary | ICD-10-CM | POA: Insufficient documentation

## 2012-12-21 DIAGNOSIS — Z79899 Other long term (current) drug therapy: Secondary | ICD-10-CM | POA: Insufficient documentation

## 2012-12-21 DIAGNOSIS — K648 Other hemorrhoids: Secondary | ICD-10-CM | POA: Diagnosis not present

## 2012-12-21 DIAGNOSIS — G4730 Sleep apnea, unspecified: Secondary | ICD-10-CM | POA: Insufficient documentation

## 2012-12-21 HISTORY — PX: HOT HEMOSTASIS: SHX5433

## 2012-12-21 HISTORY — PX: COLONOSCOPY: SHX5424

## 2012-12-21 LAB — GLUCOSE, CAPILLARY: Glucose-Capillary: 133 mg/dL — ABNORMAL HIGH (ref 70–99)

## 2012-12-21 SURGERY — COLONOSCOPY
Anesthesia: Monitor Anesthesia Care

## 2012-12-21 MED ORDER — LACTATED RINGERS IV SOLN
INTRAVENOUS | Status: DC
  Administered 2012-12-21: 12:00:00 via INTRAVENOUS
  Administered 2012-12-21: 1000 mL via INTRAVENOUS

## 2012-12-21 MED ORDER — KETAMINE HCL 10 MG/ML IJ SOLN
INTRAMUSCULAR | Status: DC | PRN
  Administered 2012-12-21 (×4): 10 mg via INTRAVENOUS

## 2012-12-21 MED ORDER — PROMETHAZINE HCL 25 MG/ML IJ SOLN: 6.2500 mg | INTRAMUSCULAR | Status: DC | PRN

## 2012-12-21 MED ORDER — SODIUM CHLORIDE 0.9 % IV SOLN: INTRAVENOUS | Status: DC

## 2012-12-21 MED ORDER — PROPOFOL 10 MG/ML IV BOLUS
INTRAVENOUS | Status: DC | PRN
  Administered 2012-12-21 (×3): 20 mg via INTRAVENOUS
  Administered 2012-12-21: 40 mg via INTRAVENOUS
  Administered 2012-12-21 (×5): 20 mg via INTRAVENOUS
  Administered 2012-12-21: 10 mg via INTRAVENOUS
  Administered 2012-12-21: 40 mg via INTRAVENOUS
  Administered 2012-12-21: 10 mg via INTRAVENOUS
  Administered 2012-12-21: 20 mg via INTRAVENOUS
  Administered 2012-12-21: 10 mg via INTRAVENOUS

## 2012-12-21 MED ORDER — MIDAZOLAM HCL 5 MG/5ML IJ SOLN
INTRAMUSCULAR | Status: DC | PRN
  Administered 2012-12-21: 0.5 mg via INTRAVENOUS
  Administered 2012-12-21: .5 mg via INTRAVENOUS

## 2012-12-21 MED ORDER — RIVAROXABAN 20 MG PO TABS: 20.0000 mg | ORAL_TABLET | Freq: Every day | ORAL | Status: DC

## 2012-12-21 NOTE — H&P (View-Only) (Signed)
Subjective:    Patient ID: Destiny Shelton, female    DOB: [birth year], 67 y.o.   MRN: [id number]  HPI The patient returns with her husband. She is stable without any worsening of her dyspnea or other health status. In may have performed a colonoscopy and upper GI endoscopy because of recurrent iron deficiency anemia and heme positive stool. On her colonoscopy she had 14 polyps, a large sigmoid polyp might represent a recurrence of previous polyp and it had high-grade dysplasia and a focus, in the pieces of polyp removed and submitted to pathology. She has not noted any bleeding. She remained somewhat constipated she remains on iron and she uses narcotic pain relievers at times. It is not clear if the MiraLax is helping on a single dose a day she said. Allergies  Allergen Reactions  . Aspirin Shortness Of Breath  . Nsaids Shortness Of Breath   Outpatient Prescriptions Prior to Visit  Medication Sig Dispense Refill  . alendronate (FOSAMAX) 70 MG tablet Take 70 mg by mouth every 7 (seven) days. Take with a full glass of water on an empty stomach. Takes on Thursday.      . ALPRAZolam (XANAX) 0.5 MG tablet Take 0.5-1 mg by mouth at bedtime as needed. For anxiety      . benzonatate (TESSALON) 200 MG capsule Take 200 mg by mouth 3 (three) times daily as needed. For cough      . budesonide-formoterol (SYMBICORT) 160-4.5 MCG/ACT inhaler Inhale 2 puffs into the lungs 2 (two) times daily.  1 Inhaler  6  . CALCIUM-VITAMIN D PO Take 1 tablet by mouth 2 (two) times daily.       . cyclobenzaprine (FLEXERIL) 10 MG tablet Take 10 mg by mouth at bedtime.       . docusate sodium (COLACE) 100 MG capsule Take 100 mg by mouth 2 (two) times daily.        . DULoxetine (CYMBALTA) 60 MG capsule Take 60 mg by mouth daily.        . furosemide (LASIX) 20 MG tablet Take 2 tablets (40 mg total) by mouth 2 (two) times daily.  60 tablet  6  . gabapentin (NEURONTIN) 600 MG tablet Take 600 mg by mouth 3 (three) times daily as  needed. For pain      . hydrocodone-acetaminophen (LORCET-HD) 5-500 MG per capsule Take 1 capsule by mouth every 8 (eight) hours as needed. For pain      . insulin aspart (NOVOLOG) 100 UNIT/ML injection Inject 10 Units into the skin 2 (two) times daily.       . insulin glargine (LANTUS) 100 UNIT/ML injection Inject 70 Units into the skin daily before breakfast.  10 mL    . iron polysaccharides (NIFEREX) 150 MG capsule Take 1 capsule (150 mg total) by mouth daily.  30 capsule  6  . levalbuterol (XOPENEX HFA) 45 MCG/ACT inhaler Inhale 1-2 puffs into the lungs every 4 (four) hours as needed for wheezing.  1 Inhaler  12  . Multiple Vitamin (MULTIVITAMIN) tablet Take 1 tablet by mouth daily.        . Omega-3 Fatty Acids (FISH OIL) 1000 MG CAPS Take 1 capsule by mouth daily.        Marland Kitchen omeprazole (PRILOSEC) 20 MG capsule Take 20 mg by mouth daily.        . potassium chloride (KLOR-CON) 10 MEQ CR tablet Take 20-40 mEq by mouth 2 (two) times daily. Takes in AM and in PM      .  pravastatin (PRAVACHOL) 40 MG tablet Take 80 mg by mouth at bedtime.       . Rivaroxaban (XARELTO) 20 MG TABS Take 20 mg by mouth daily with supper.      . roflumilast (DALIRESP) 500 MCG TABS tablet Take 500 mcg by mouth daily.       Marland Kitchen tiotropium (SPIRIVA) 18 MCG inhalation capsule Place 1 capsule (18 mcg total) into inhaler and inhale daily.  30 capsule  6  . valsartan (DIOVAN) 160 MG tablet Take 320 mg by mouth daily.      . verapamil (VERELAN PM) 360 MG 24 hr capsule Take 360 mg by mouth daily.      . cefdinir (OMNICEF) 300 MG capsule Take 2 capsules (600 mg total) by mouth daily. For 7 days  14 capsule  0   No facility-administered medications prior to visit.   Past Medical History  Diagnosis Date  . ALLERGIC RHINITIS   . Hypertension   . Adenomatous colon polyp   . Gastritis   . External hemorrhoids   . Diverticulosis of colon   . Diabetes mellitus type 2 with neurological manifestations     On Insulin  .  Depression   . Osteoporosis   . Morbidly obese   . Back pain, chronic   . Anxiety   . Hyperlipemia   . COPD (chronic obstructive pulmonary disease)     Requiring Home O2 at 4L  . GERD (gastroesophageal reflux disease)   . Neuromuscular disorder     diabetic neuropathy  . Carotid stenosis   . Urosepsis 05/26/2012  . Atrial tachycardia     Mostly Sinus Tachycardia  . A-fib   . Osteoarthritis   . Fibromyalgia     "pain in arms and shoulders."  . Sleep apnea     no cpcp machine 02 at 4l/min  . Nephrolithiasis   . Headache(784.0)   . Anemia     iron deficient   Past Surgical History  Procedure Laterality Date  . Appendectomy  1963  . Cholecystectomy  1963  . Abdominal hysterectomy  1978  . Total knee arthroplasty  1997    right  . Upper gastrointestinal endoscopy  04/09/2006    w/Dilation, gastritis  . Colonoscopy w/ biopsies and polypectomy  07/22/2007, 04/28/2007    2009: diverticulosis, 2008: diverticulosis, adenomatous polyps  . Tee without cardioversion  03/24/2012    Procedure: TRANSESOPHAGEAL ECHOCARDIOGRAM (TEE);  Surgeon: Chrystie Nose, MD;  Location: [facility] OR;  Service: Cardiovascular;  Laterality: N/A;  TEE with Bubble  . Esophagogastroduodenoscopy (egd) with propofol N/A 10/01/2012    Procedure: ESOPHAGOGASTRODUODENOSCOPY (EGD) WITH PROPOFOL;  Surgeon: Iva Boop, MD;  Location: [facility] ENDOSCOPY;  Service: Endoscopy;  Laterality: N/A;  . Colonoscopy with propofol N/A 10/01/2012    Procedure: COLONOSCOPY WITH PROPOFOL;  Surgeon: Iva Boop, MD;  Location: [facility] ENDOSCOPY;  Service: Endoscopy;  Laterality: N/A;   History   Social History  . Marital Status: Married    Spouse Name: N/A    Number of Children: 2  . Years of Education: HS   Occupational History  . Disability    Social History Main Topics  . Smoking status: Former Smoker -- 2.00 packs/day for 30 years    Types: Cigarettes    Quit date: 06/02/1994  . Smokeless tobacco: Never Used  . Alcohol Use:  No  . Drug Use: No  . Sexually Active: No          Social History Narrative  Daily caffeine    Family History  Problem Relation Age of Onset  . Heart disease Mother   . Heart disease Father   . Heart disease Brother   . Stroke Mother   . Stroke Brother   . Skin cancer Brother   . Colon cancer Neg Hx   . Colon polyps Sister   . Diabetes Sister   . Diabetes Brother   . Irritable bowel syndrome Daughter     Review of Systems On home oxygen, as above    Objective:   Physical Exam General:  NAD, she is chronically ill and on 4 L nasal cannula oxygen Eyes:   anicteric Lungs:  clear Heart:  S1S2 no rubs, murmurs or gallops   Data Reviewed:  Colonoscopy and upper GI endoscopy from May 2014 as well as pathology, colonoscopy pathology described above she had a fundic gland polyp in her stomach Lab Results  Component Value Date   HGB 10.1* 10/02/2012      Assessment & Plan:   1. Benign neoplasm of colon - adenoma w/ high-grade dysplasia not completely removed   2. Atrial fibrillation   3. Long term (current) use of anticoagulants - Xarelto   4. Anemia, iron deficiency   5. Constipation on iron and narcotics 1. See problem oriented charting as well 2. Have decided to pursue a repeat colonoscopy also relatively check her CBC today. She will increase MiraLax to twice a day help her constipation.

## 2012-12-21 NOTE — Transfer of Care (Signed)
Immediate Anesthesia Transfer of Care Note  Patient: Destiny Shelton  Procedure(s) Performed: Procedure(s): COLONOSCOPY (N/A) HOT HEMOSTASIS (ARGON PLASMA COAGULATION/BICAP) (N/A)  Patient Location: PACU and Endoscopy Unit  Anesthesia Type:MAC  Level of Consciousness: awake, alert , oriented and patient cooperative  Airway & Oxygen Therapy: Patient Spontanous Breathing and Patient connected to face mask oxygen  Post-op Assessment: Report given to PACU RN, Post -op Vital signs reviewed and stable and Patient moving all extremities  Post vital signs: Reviewed and stable  Complications: No apparent anesthesia complications

## 2012-12-21 NOTE — Preoperative (Signed)
Beta Blockers   Reason not to administer Beta Blockers:Not Applicable 

## 2012-12-21 NOTE — Op Note (Signed)
[facility] [address] [place], [address]   COLONOSCOPY PROCEDURE REPORT  PATIENT: Destiny Shelton, Destiny Shelton.  MR#: [id number] BIRTHDATE: 1945-11-11 , 67  yrs. old GENDER: Female ENDOSCOPIST: Iva Boop, MD, [facility] PROCEDURE DATE:  12/21/2012 PROCEDURE:   Colonoscopy with biopsy and snare polypectomy and Colonoscopy with tissue ablation ASA CLASS:   Class IV INDICATIONS:remove residual polyp. MEDICATIONS: MAC sedation, administered by CRNA  DESCRIPTION OF PROCEDURE:   After the risks benefits and alternatives of the procedure were thoroughly explained, informed consent was obtained.  A digital rectal exam revealed no abnormalities of the rectum.   The [id number] ([id number])  endoscope was introduced through the anus and advanced to the cecum, which was identified by both the appendix and ileocecal valve. No adverse events experienced.   The quality of the prep was excellent using Suprep  The instrument was then slowly withdrawn as the colon was fully examined.      COLON FINDINGS: A large sessile polyp was found in the sigmoid colon - residual polyp, tattoos mark area.  A polypectomy was performed using snare cautery.  The resection was incomplete and the polyp tissue was completely retrieved.  Destruction of tissue via ablation was attempted. and accomplisehed.  There was no blood loss from maneuver.  Argon plasma coagulation was used.  Care was given to ensure that the lumen was suctioned well.   Seven sessile polyps measuring 2-10 mm in size were found in the transverse colon and descending colon.  A polypectomy was performed with cold forceps (1 polyp) , with a cold snare (5 polyps) and using snare cautery (10 mm descending polyp, 3 clips applied as cautery was not complete) The resection was complete and the polyp tissue was completely retrieved.   Severe diverticulosis was noted in the sigmoid colon. Small internal hemorrhoids were found.   The colon mucosa  was otherwise normal.  Retroflexed views revealed internal hemorrhoids. ds    The scope was withdrawn and the procedure completed. COMPLICATIONS: There were no complications.  ENDOSCOPIC IMPRESSION: 1.   Large sessile polyp was found in the sigmoid colon; polypectomy was performed using snare cautery; Destruction of tissue via ablation was attempted - this polyp is not removable in toto using snaring technique. 2.   Seven sessile polyps measuring 2-10 mm in size were found in the transverse colon and descending colon; polypectomy was performed with cold forceps, with a cold snare and using snare cautery 3.   Severe diverticulosis was noted in the sigmoid colon 4.   Small internal hemorrhoids 5.   The colon mucosa was otherwise normal  RECOMMENDATIONS: restart Emelda Fear Friday 7/25 I will call with results and plans - observation likely given co-morbidities If needs another colonoscopy would not use SuPrep per her request   eSigned:  Iva Boop, MD, [facility] 12/21/2012 12:39 PM   cc: Kari Baars, MD and The Patient   PATIENT NAME:  Destiny Shelton, Destiny Shelton. MR#: [id number]

## 2012-12-21 NOTE — Anesthesia Postprocedure Evaluation (Signed)
Anesthesia Post Note  Patient: Destiny Shelton  Procedure(s) Performed: Procedure(s) (LRB): COLONOSCOPY (N/A) HOT HEMOSTASIS (ARGON PLASMA COAGULATION/BICAP) (N/A)  Anesthesia type: MAC  Patient location: PACU  Post pain: Pain level controlled  Post assessment: Post-op Vital signs reviewed  Last Vitals: BP 145/79  Temp(Src) 36.9 C (Oral)  Resp 14  Ht 5\' 5"  (1.651 m)  Wt 225 lb (102.059 kg)  BMI 37.44 kg/m2  SpO2 95%  Post vital signs: Reviewed  Level of consciousness: awake  Complications: No apparent anesthesia complications

## 2012-12-21 NOTE — Interval H&P Note (Signed)
History and Physical Interval Note:  12/21/2012 10:56 AM  Destiny Shelton  has presented today for surgery, with the diagnosis of polyp not completely removed/dysplasia  The various methods of treatment have been discussed with the patient and family. After consideration of risks, benefits and other options for treatment, the patient has consented to  Procedure(s): COLONOSCOPY (N/A) HOT HEMOSTASIS (ARGON PLASMA COAGULATION/BICAP) (N/A) as a surgical intervention .  The patient's history has been reviewed, patient examined, no change in status, stable for surgery.  I have reviewed the patient's chart and labs.  Questions were answered to the patient's satisfaction.     Stan Head

## 2012-12-21 NOTE — Anesthesia Preprocedure Evaluation (Addendum)
Anesthesia Evaluation  Patient identified by MRN, date of birth, ID band Patient awake    Reviewed: Allergy & Precautions, H&P , NPO status , Patient's Chart, lab work & pertinent test results  History of Anesthesia Complications Negative for: history of anesthetic complications  Airway Mallampati: II TM Distance: >3 FB Neck ROM: Full    Dental  (+) Poor Dentition, Partial Upper, Dental Advisory Given and Missing,    Pulmonary shortness of breath, at rest, lying and Long-Term Oxygen Therapy, asthma , sleep apnea , COPD COPD inhaler and oxygen dependent,    + decreased breath sounds+ wheezing      Cardiovascular hypertension, Pt. on medications + Peripheral Vascular Disease + dysrhythmias Atrial Fibrillation Rhythm:Regular Rate:Normal     Neuro/Psych  Headaches, PSYCHIATRIC DISORDERS Anxiety Depression  Neuromuscular disease    GI/Hepatic GERD-  Medicated and Controlled,  Endo/Other  diabetes, Well Controlled, Type 2, Insulin Dependent  Renal/GU Renal disease  negative genitourinary   Musculoskeletal  (+) Fibromyalgia -  Abdominal   Peds  Hematology   Anesthesia Other Findings H/O Pulmonary Hypertension  Reproductive/Obstetrics                          Anesthesia Physical  Anesthesia Plan  ASA: III  Anesthesia Plan: MAC   Post-op Pain Management:    Induction:   Airway Management Planned: Simple Face Mask  Additional Equipment:   Intra-op Plan:   Post-operative Plan:   Informed Consent:   Dental advisory given  Plan Discussed with: CRNA  Anesthesia Plan Comments:         Anesthesia Quick Evaluation

## 2012-12-22 ENCOUNTER — Encounter (HOSPITAL_COMMUNITY): Payer: Self-pay | Admitting: Internal Medicine

## 2012-12-26 NOTE — Progress Notes (Signed)
Quick Note:  Let her know that the polyps were benign but the one large one still has the high grade dysplasia - starting to change towards cancer Ask her to schedule a non-urgent visit with me in Aug or September so we can discuss furtrher - ______

## 2013-01-03 ENCOUNTER — Ambulatory Visit (INDEPENDENT_AMBULATORY_CARE_PROVIDER_SITE_OTHER): Payer: Medicare Other | Admitting: Cardiology

## 2013-01-03 ENCOUNTER — Encounter: Payer: Self-pay | Admitting: Cardiology

## 2013-01-03 VITALS — BP 118/62 | HR 101 | Ht 65.0 in | Wt 226.5 lb

## 2013-01-03 DIAGNOSIS — I498 Other specified cardiac arrhythmias: Secondary | ICD-10-CM | POA: Diagnosis not present

## 2013-01-03 DIAGNOSIS — IMO0001 Reserved for inherently not codable concepts without codable children: Secondary | ICD-10-CM

## 2013-01-03 DIAGNOSIS — I272 Pulmonary hypertension, unspecified: Secondary | ICD-10-CM

## 2013-01-03 DIAGNOSIS — I4891 Unspecified atrial fibrillation: Secondary | ICD-10-CM | POA: Diagnosis not present

## 2013-01-03 DIAGNOSIS — R000 Tachycardia, unspecified: Secondary | ICD-10-CM

## 2013-01-03 DIAGNOSIS — R0602 Shortness of breath: Secondary | ICD-10-CM | POA: Diagnosis not present

## 2013-01-03 DIAGNOSIS — J449 Chronic obstructive pulmonary disease, unspecified: Secondary | ICD-10-CM

## 2013-01-03 DIAGNOSIS — I480 Paroxysmal atrial fibrillation: Secondary | ICD-10-CM

## 2013-01-03 DIAGNOSIS — I10 Essential (primary) hypertension: Secondary | ICD-10-CM

## 2013-01-03 DIAGNOSIS — I2789 Other specified pulmonary heart diseases: Secondary | ICD-10-CM

## 2013-01-03 NOTE — Patient Instructions (Addendum)
You really seem to be doing well.   I think we should leave your medications alone -- just remember to watch your weights daily & adjust Lasix according to any gains.  If you need procedures to be done, we can give the OK to hold Xarelto.  Since you are doing as well as you are, we can have you follow up in 6 months.  Marykay Lex, MD

## 2013-01-04 DIAGNOSIS — M810 Age-related osteoporosis without current pathological fracture: Secondary | ICD-10-CM | POA: Diagnosis not present

## 2013-01-04 DIAGNOSIS — Z79899 Other long term (current) drug therapy: Secondary | ICD-10-CM | POA: Diagnosis not present

## 2013-01-12 DIAGNOSIS — E785 Hyperlipidemia, unspecified: Secondary | ICD-10-CM | POA: Diagnosis not present

## 2013-01-12 DIAGNOSIS — M549 Dorsalgia, unspecified: Secondary | ICD-10-CM | POA: Diagnosis not present

## 2013-01-12 DIAGNOSIS — Z6836 Body mass index (BMI) 36.0-36.9, adult: Secondary | ICD-10-CM | POA: Diagnosis not present

## 2013-01-12 DIAGNOSIS — J449 Chronic obstructive pulmonary disease, unspecified: Secondary | ICD-10-CM | POA: Diagnosis not present

## 2013-01-12 DIAGNOSIS — E1149 Type 2 diabetes mellitus with other diabetic neurological complication: Secondary | ICD-10-CM | POA: Diagnosis not present

## 2013-01-12 DIAGNOSIS — I10 Essential (primary) hypertension: Secondary | ICD-10-CM | POA: Diagnosis not present

## 2013-01-12 DIAGNOSIS — I4891 Unspecified atrial fibrillation: Secondary | ICD-10-CM | POA: Diagnosis not present

## 2013-01-12 DIAGNOSIS — M810 Age-related osteoporosis without current pathological fracture: Secondary | ICD-10-CM | POA: Diagnosis not present

## 2013-01-22 ENCOUNTER — Encounter: Payer: Self-pay | Admitting: Cardiology

## 2013-01-22 NOTE — Assessment & Plan Note (Addendum)
Multiple combined etiologies.  On stable dose of medications.  I had increased her diuretic to help reduce pulmonary pressures.  This is based on the right and left heart catheterization numbers.  She seemed to do much better with this dose of Lasix she is currently on.  She denies any edema.  I wonder if component was, as do to obesity hypoventilation syndrome.  From a cardiac standpoint she does have diastolic dysfunction and baseline tachycardia, but her cardiac output certainly didn't correlate with systolic heart failure.  She has some diastolic function, complicated by tachycardia.  Otherwise her dyspnea is related to obesity and possible obesity hypoventilation syndrome and COPD.  Plan: Continue current dose of Lasix; maintain adequate blood pressure control and heart rate control.  No medication changes.

## 2013-01-22 NOTE — Assessment & Plan Note (Addendum)
Despite the fact she is short of breath, she needs to use her oxygen with increased percentage to allow her to be more active and doing some exercise.  She has been trying to work and again on trying to get into some type of rehabilitation maintenance program. Unfortunately with her dyspnea, her exercise tolerance is significantly low.  This is also, complicated by her tachycardia. I did discuss continuing to work on dietary modification at best she can.  Would consider the possibility of nutrition counseling.

## 2013-01-22 NOTE — Assessment & Plan Note (Addendum)
Her heart rate is a little slower they are EKG that was the last time she came, but she still has heart rates usually in the 90s to 100.  She did note that at home and will go down into the 70s and 80s which is not doing anything.  Weren't max dose of verapamil, we are loathe to use beta blockers, and she did not have any beneficial effect of diltiazem.  She does not have any new symptoms of dizziness or near-syncope be a syncope from her heart beating fast.  This would suggest that perhaps a compensatory mechanism to maintain cardiac output in the setting of hypoxia as well as hypoxemia.  As she sees relatively well controlled, my plan would be to maintain her current medications.

## 2013-01-22 NOTE — Assessment & Plan Note (Signed)
Well-controlled today.  We would shoot for lower side blood pressures to allow for maximal afterload reduction.

## 2013-01-22 NOTE — Assessment & Plan Note (Addendum)
As best I can tell, she has not had any recurrent episodes since her UTI with sepsis.  She remains on verapamil for rate control.  It appears that amiodarone is not an active medication for her.  She was to try to avoid it unless she has a further episode, using it as a short-term treatment for paroxysmal.  Anticoagulation on Xarelto.  We discussed timing of dosing in the evening with evening meal.

## 2013-01-22 NOTE — Assessment & Plan Note (Signed)
Right left heart Numbers would suggest the these elevated pressures are more of a left heart related factor than expected.  With elevated filling pressures in both sides of the heart, this does suggest some diastolic dysfunction.  In the absence of chest pressure, I see no need to pursue invasive evaluation yet again.

## 2013-01-22 NOTE — Assessment & Plan Note (Signed)
She is followed by a pulmonologist as well as her primary physician.

## 2013-01-22 NOTE — Progress Notes (Signed)
Patient ID: Destiny Shelton, female   DOB: 09-Mar-1946, 67 y.o.   MRN: [id number] PCP: Kari Baars, MD  Clinic Note: Chief Complaint  Patient presents with  . Follow-up    Tachycardia, moderate pulmonary hypertension, PAF   HPI: Destiny Shelton is a 67 y.o. female with a PMH below who presents today for followup for her dyspnea, pulmonary hypertension, sinus tachycardia at end PAF. She pretty much has chronic dyspnea that is multifactorial related to COPD, obesity, moderately elevated pulmonary pressures, likely grade 1 diastolic dysfunction, inappropriate tachycardia and intermittent he has PAF.  She has had a right and left heart catheterization, 2-D transthoracic echocardiography and transesophageal echocardiography as well as chest CTs and oblique cardiac MRI to evaluate her dyspnea.  See below.  I last saw her in March, she was doing relatively well with the increased dose of Lasix.  This visit was shortly after an episode where she was in the hospital for UTI with urosepsis and developed rapid A. fib.  She was discharged on additional rate control and Xarelto for anti-coagulation.  Unfortunately she's had guaiac positive stools and anemia.  Her last hemoglobin was in the mid 10 range.  While in A. fib with RVR, she did not tolerate that at all with her dyspnea, therefore she was put on amiodarone for rhythm control despite her lung disease.  Interval History: Today she comes in doing quite well.  She has her baseline shortness of breath but seems to be better over the last couple months.  She still sleeps in a recliner, which she has done for many years.  She says the swelling is much better.  When it never tried her she will get mildly orthopneic with some PND.  She denies any chest pain or pressure with rest or exertion.  She has not had any more episodes of irregular heartbeats, just her standard fashion a heart rate.  She does note that is not her heart rate gets down low into the 70s and  80s.  She does feel better when her heart rate is lower.  She is trying to get into doing something with activity, but is finding it difficult to do so.    The remainder of Cardiovascular ROS is as follows: positive for - dyspnea on exertion, orthopnea, paroxysmal nocturnal dyspnea, rapid heart rate and shortness of breath negative for - chest pain, edema, irregular heartbeat, loss of consciousness, murmur or palpitations  With her history of guaiac positive stools and anemia being on Xarelto, she denies any melena, hematochezia or hematuria.  No nosebleeds.  No TIA or amaurosis fugax symptoms.  No claudication symptoms.  Past Medical History  Diagnosis Date  . ALLERGIC RHINITIS   . Hypertension   . Adenomatous colon polyp   . Gastritis   . Diverticulosis of colon   . Diabetes mellitus type 2 with neurological manifestations     On Insulin  . Depression   . Osteoporosis   . Morbidly obese   . Back pain, chronic   . Anxiety   . Hyperlipemia   . COPD (chronic obstructive pulmonary disease)     Requiring Home O2 at 4L  . GERD (gastroesophageal reflux disease)   . Urosepsis 05/26/2012  . Atrial tachycardia     Mostly Sinus Tachycardia  . PAF (paroxysmal atrial fibrillation)   . Osteoarthritis   . Sleep apnea     no cpcp machine 02 at 4l/min  . External hemorrhoids   . Carotid stenosis   .  Nephrolithiasis   . Headache(784.0)     tension  . Neuromuscular disorder     diabetic neuropathy  . Fibromyalgia     "pain in arms and shoulders."  . Anemia     iron deficient  . Inappropriate sinus tachycardia     Prior Cardiac Evaluation and Past Surgical History: Past Surgical History  Procedure Laterality Date  . Appendectomy  1963  . Cholecystectomy  1963  . Abdominal hysterectomy  1978  . Total knee arthroplasty  1997    right  . Upper gastrointestinal endoscopy  04/09/2006    w/Dilation, gastritis  . Colonoscopy w/ biopsies and polypectomy  07/22/2007, 04/28/2007    2009:  diverticulosis, 2008: diverticulosis, adenomatous polyps  . Tee without cardioversion  03/24/2012    Procedure: TRANSESOPHAGEAL ECHOCARDIOGRAM (TEE);  Surgeon: Chrystie Nose, MD;  Location: [facility] OR;  Service: Cardiovascular;  Laterality: N/A;  TEE with Bubble  . Esophagogastroduodenoscopy (egd) with propofol N/A 10/01/2012    Procedure: ESOPHAGOGASTRODUODENOSCOPY (EGD) WITH PROPOFOL;  Surgeon: Iva Boop, MD;  Location: [facility] ENDOSCOPY;  Service: Endoscopy;  Laterality: N/A;  . Colonoscopy with propofol N/A 10/01/2012    Procedure: COLONOSCOPY WITH PROPOFOL;  Surgeon: Iva Boop, MD;  Location: [facility];  Service: Endoscopy;  Laterality: N/A;  . Colonoscopy N/A 12/21/2012    Procedure: COLONOSCOPY;  Surgeon: Iva Boop, MD;  Location: [facility]COPY;  Service: Endoscopy;  Laterality: N/A;  . Hot hemostasis N/A 12/21/2012    Procedure: HOT HEMOSTASIS (ARGON PLASMA COAGULATION/BICAP);  Surgeon: Iva Boop, MD;  Location: [facility];  Service: Endoscopy;  Laterality: N/A;  . Right and left cardiac catheterization  July 2013    RAP: 22 mmHg, and RVP: 46/16 mmHg, RV EDP 18; L. BP 110/11 mmHg, LVEDP 20 mmHg;  PAP 45/29 mmHg (mean 37 mmHg); PCWP 29/22 mmHg - 26 mmHg; no significant coronary disease    Allergies  Allergen Reactions  . Aspirin Shortness Of Breath  . Nsaids Shortness Of Breath    Current Outpatient Prescriptions  Medication Sig Dispense Refill  . alendronate (FOSAMAX) 70 MG tablet Take 70 mg by mouth every Thursday. Take with a full glass of water on an empty stomach. Takes on Thursday.      . ALPRAZolam (XANAX) 0.5 MG tablet Take 0.5-1 mg by mouth at bedtime as needed. For anxiety      . benzonatate (TESSALON) 200 MG capsule Take 400 mg by mouth at bedtime. For cough      . budesonide-formoterol (SYMBICORT) 160-4.5 MCG/ACT inhaler Inhale 2 puffs into the lungs 2 (two) times daily.  1 Inhaler  6  . CALCIUM-VITAMIN D PO Take 1 tablet by mouth 2 (two) times daily.         . cyclobenzaprine (FLEXERIL) 10 MG tablet Take 10 mg by mouth at bedtime.       . docusate sodium (COLACE) 100 MG capsule Take 100 mg by mouth 2 (two) times daily.       . DULoxetine (CYMBALTA) 60 MG capsule Take 60 mg by mouth every morning.       . furosemide (LASIX) 20 MG tablet Take 20 mg by mouth 2 (two) times daily.       Marland Kitchen gabapentin (NEURONTIN) 600 MG tablet Take 600 mg by mouth 3 (three) times daily.       Marland Kitchen HYDROcodone-acetaminophen (NORCO/VICODIN) 5-325 MG per tablet Take 1 tablet by mouth every 8 (eight) hours as needed for pain.      Marland Kitchen insulin  aspart (NOVOLOG) 100 UNIT/ML injection Inject 10 Units into the skin 2 (two) times daily.       . insulin glargine (LANTUS) 100 UNIT/ML injection Inject 70 Units into the skin daily before breakfast.  10 mL    . iron polysaccharides (NIFEREX) 150 MG capsule Take 1 capsule (150 mg total) by mouth daily.  30 capsule  6  . levalbuterol (XOPENEX HFA) 45 MCG/ACT inhaler Inhale 1-2 puffs into the lungs every 4 (four) hours as needed for wheezing.  1 Inhaler  12  . Multiple Vitamin (MULTIVITAMIN) tablet Take 1 tablet by mouth daily.        . Omega-3 Fatty Acids (FISH OIL) 1000 MG CAPS Take 1 capsule by mouth 3 (three) times daily.       Marland Kitchen omeprazole (PRILOSEC) 20 MG capsule Take 20 mg by mouth at bedtime.       . polyethylene glycol powder (GLYCOLAX/MIRALAX) powder Take 17 g by mouth 2 (two) times daily.       . potassium chloride (KLOR-CON) 10 MEQ CR tablet Take 10 mEq by mouth 3 (three) times daily.       . pravastatin (PRAVACHOL) 40 MG tablet Take 80 mg by mouth at bedtime.       . Rivaroxaban (XARELTO) 20 MG TABS Take 1 tablet (20 mg total) by mouth daily with supper. Restart on Friday 12/24/2012  30 tablet    . roflumilast (DALIRESP) 500 MCG TABS tablet Take 500 mcg by mouth daily.       Marland Kitchen tiotropium (SPIRIVA) 18 MCG inhalation capsule Place 1 capsule (18 mcg total) into inhaler and inhale daily.  30 capsule  6  . valsartan (DIOVAN) 160 MG tablet  Take 80 mg by mouth at bedtime.       . verapamil (VERELAN PM) 360 MG 24 hr capsule Take 1 capsule (360 mg total) by mouth daily.  30 capsule  9   No current facility-administered medications for this visit.    History   Social History  . Marital Status: Married    Spouse Name: N/A    Number of Children: 2  . Years of Education: HS   Occupational History  . Disability    Social History Main Topics  . Smoking status: Former Smoker -- 2.00 packs/day for 30 years    Types: Cigarettes    Quit date: 06/02/1994  . Smokeless tobacco: Never Used  . Alcohol Use: No  . Drug Use: No  . Sexual Activity: No   Other Topics Concern  . Not on file   Social History Narrative   She is a married mother of 2, grandmother of 6, great-grandmother of 2. She is with her husband here today. She does not really exercise as she is on home oxygen and has baseline dyspnea.    Does not smoke. Does not drink. She quit smoking somewhere between 10-15 years ago. She is now trying to work on picking up her activity level. She is trying to I think more than likely get back into pulmonary rehabilitation potentially.       Daily caffeine.    ROS: A comprehensive Review of Systems - Negative except Pertinent positives noted above.  She does have some arthritis pains that bother her intermittently.  PHYSICAL EXAM BP 118/62  Pulse 101  Ht 5\' 5"  (1.651 m)  Wt 226 lb 8 oz (102.74 kg)  BMI 37.69 kg/m2 General appearance: alert, cooperative, appears stated age, no distress, morbidly obese and Chronically ill-appearing but  healthier than her last visit.  Well-groomed.  Obese, but no longer morbidly obese - mostly truncal.  Alert and oriented x3 his question appropriately.  Has chronic oxygen from her concentrator.  No increased work of breathing. Neck: no carotid bruit, no JVD and supple, symmetrical, trachea midline Lungs: Essentially distant breath sounds throughout, mostly CTA B. with mild interstitial sounds.   No W./R./R.  Nonlabored Heart: Tachycardic, but just borderline.  Regular rhythm.  Normal S1-S2, no M./R./G.  Unable to palpate PMI due to body habitus. Abdomen: soft, non-tender; bowel sounds normal; no masses,  no organomegaly and Unable to palpate it does have due to body habitus.  No HJR Extremities: edema Trace, no ulcers, gangrene or trophic changes and Mild spider veins, no significant varicosities Pulses: 2+ and symmetric Skin: Skin color, texture, turgor normal. No rashes or lesions Neurologic: Grossly normal  [id number]:[id number] today: Yes Rate:101 , Rhythm: Sinus tachycardia, otherwise normal   Recent Labs: Reviewed in Epic - borderline anemia otherwise normal  ASSESSMENT / PLAN: CHRONIC DYSPNEA Multiple combined etiologies.  On stable dose of medications.  I had increased her diuretic to help reduce pulmonary pressures.  This is based on the right and left heart catheterization numbers.  She seemed to do much better with this dose of Lasix she is currently on.  She denies any edema.  I wonder if component was, as do to obesity hypoventilation syndrome.  From a cardiac standpoint she does have diastolic dysfunction and baseline tachycardia, but her cardiac output certainly didn't correlate with systolic heart failure.  She has some diastolic function, complicated by tachycardia.  Otherwise her dyspnea is related to obesity and possible obesity hypoventilation syndrome and COPD.  Plan: Continue current dose of Lasix; maintain adequate blood pressure control and heart rate control.  No medication changes.  Obesity, Class II, BMI 35-39.9, with comorbidity Despite the fact she is short of breath, she needs to use her oxygen with increased percentage to allow her to be more active and doing some exercise.  She has been trying to work and again on trying to get into some type of rehabilitation maintenance program. Unfortunately with her dyspnea, her exercise tolerance is significantly low.   This is also, complicated by her tachycardia. I did discuss continuing to work on dietary modification at best she can.  Would consider the possibility of nutrition counseling.  PAF (paroxysmal atrial fibrillation) As best I can tell, she has not had any recurrent episodes since her UTI with sepsis.  She remains on verapamil for rate control.  It appears that amiodarone is not an active medication for her.  She was to try to avoid it unless she has a further episode, using it as a short-term treatment for paroxysmal.  Anticoagulation on Xarelto.  We discussed timing of dosing in the evening with evening meal.  Inappropriate sinus tachycardia Her heart rate is a little slower they are EKG that was the last time she came, but she still has heart rates usually in the 90s to 100.  She did note that at home and will go down into the 70s and 80s which is not doing anything.  Weren't max dose of verapamil, we are loathe to use beta blockers, and she did not have any beneficial effect of diltiazem.  She does not have any new symptoms of dizziness or near-syncope be a syncope from her heart beating fast.  This would suggest that perhaps a compensatory mechanism to maintain cardiac output in the setting of  hypoxia as well as hypoxemia.  As she sees relatively well controlled, my plan would be to maintain her current medications.  COPD (chronic obstructive pulmonary disease) - on Home O2 She is followed by a pulmonologist as well as her primary physician.  Pulmonary hypertension Right left heart Numbers would suggest the these elevated pressures are more of a left heart related factor than expected.  With elevated filling pressures in both sides of the heart, this does suggest some diastolic dysfunction.  In the absence of chest pressure, I see no need to pursue invasive evaluation yet again.  HYPERTENSION Well-controlled today.  We would shoot for lower side blood pressures to allow for maximal afterload  reduction.    Orders Placed This Encounter  Procedures  . EKG 12-Lead    Followup: 6 months  DAVID W. Herbie Baltimore, M.D., M.S. [facility] [address] [place], [place]  [address]  [phone] Pager # [phone]

## 2013-01-24 ENCOUNTER — Ambulatory Visit (INDEPENDENT_AMBULATORY_CARE_PROVIDER_SITE_OTHER): Payer: Medicare Other | Admitting: Internal Medicine

## 2013-01-24 ENCOUNTER — Encounter: Payer: Self-pay | Admitting: Internal Medicine

## 2013-01-24 VITALS — BP 128/82 | HR 100 | Ht 65.0 in | Wt 228.2 lb

## 2013-01-24 DIAGNOSIS — D126 Benign neoplasm of colon, unspecified: Secondary | ICD-10-CM

## 2013-01-24 DIAGNOSIS — D1391 Familial adenomatous polyposis: Secondary | ICD-10-CM

## 2013-01-24 HISTORY — DX: Benign neoplasm of colon, unspecified: D12.6

## 2013-01-24 HISTORY — DX: Familial adenomatous polyposis: D13.91

## 2013-01-24 NOTE — Patient Instructions (Addendum)
You have been scheduled for an appointment with Dr. Romie Levee at [facility]. Your appointment is on ______________ at ____________. Please arrive at ____________ for registration. Make certain to bring a list of current medications, including any over the counter medications or vitamins. Also bring your co-pay if you have one as well as your insurance cards. [facility] is located at [address], [address]. Should you need to reschedule your appointment, please contact them at [phone].  I appreciate the opportunity to care for you.

## 2013-01-24 NOTE — Progress Notes (Addendum)
  Subjective:    Patient ID: Destiny Shelton, female    DOB: 10-03-1945, 67 y.o.   MRN: [id number]  HPI The patient is here with her husband and one of her daughters. 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 She also has sig cardiac and pulmonary co-morbidities.  Medications, allergies, past medical history, past surgical history, family history and social history are reviewed and updated in the EMR.  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 We discussed her situation - I explained that no cancer now but fairly likely to occur in this polyp not removed 2. Her co-morbidities could make that (#1) a moot point and she could die of another problem before this polyp ever became cancer and a greater threat 3. Surgical resecton could be an option but would be at higher risk of complications including death given COPD and cardiac problems 4. She understands all of this but would like to hear more about possible surgical options - appointment with Dr. Romie Levee will be made 5. I will communicate with her cardiologist (Dr. Herbie Baltimore) and pulmonologist (Dr. Shelle Iron) also in anticipation of rquiring pre-op clearance 6. I explained it may be that surgery is thought to be too risky - await opinion(s). 7. Polyposis situation raises issue of larger colectomy but suspect would be best to limit resection given her overall status 8. 15 mins time spent counseling patient and family  I appreciate the opportunity to care for this patient.  WU:JWJX,B Riley Lam, MD Romie Levee,  MD, Marcelyn Bruins, MD and Bryan Lemma, MD

## 2013-01-25 ENCOUNTER — Telehealth: Payer: Self-pay

## 2013-01-25 NOTE — Telephone Encounter (Signed)
Spoke to patient and confirmed that she was aware of her upcoming [facility] appointment 02/21/13 at 10:35am.

## 2013-01-28 ENCOUNTER — Ambulatory Visit (HOSPITAL_COMMUNITY)
Admission: RE | Admit: 2013-01-28 | Discharge: 2013-01-28 | Disposition: A | Discharge status: Home / Self Care | Payer: Medicare Other | Source: Ambulatory Visit | Attending: Internal Medicine | Admitting: Internal Medicine

## 2013-01-28 ENCOUNTER — Encounter (HOSPITAL_COMMUNITY): Payer: Self-pay

## 2013-01-28 DIAGNOSIS — M810 Age-related osteoporosis without current pathological fracture: Secondary | ICD-10-CM | POA: Diagnosis not present

## 2013-01-28 MED ORDER — ZOLEDRONIC ACID 5 MG/100ML IV SOLN
5.0000 mg | Freq: Once | INTRAVENOUS | Status: AC
  Administered 2013-01-28: 5 mg via INTRAVENOUS
  Filled 2013-01-28: qty 100

## 2013-01-28 MED ORDER — SODIUM CHLORIDE 0.9 % IV SOLN
Freq: Once | INTRAVENOUS | Status: AC
  Administered 2013-01-28: 14:00:00 via INTRAVENOUS

## 2013-02-21 ENCOUNTER — Encounter (INDEPENDENT_AMBULATORY_CARE_PROVIDER_SITE_OTHER): Payer: Self-pay

## 2013-02-21 ENCOUNTER — Encounter (INDEPENDENT_AMBULATORY_CARE_PROVIDER_SITE_OTHER): Payer: Self-pay | Admitting: General Surgery

## 2013-02-21 ENCOUNTER — Ambulatory Visit (INDEPENDENT_AMBULATORY_CARE_PROVIDER_SITE_OTHER): Payer: Medicare Other | Admitting: General Surgery

## 2013-02-21 ENCOUNTER — Telehealth: Payer: Self-pay | Admitting: Pulmonary Disease

## 2013-02-21 VITALS — BP 128/68 | HR 86 | Temp 97.5°F | Resp 20 | Ht 65.0 in | Wt 226.8 lb

## 2013-02-21 DIAGNOSIS — D126 Benign neoplasm of colon, unspecified: Secondary | ICD-10-CM

## 2013-02-21 DIAGNOSIS — K635 Polyp of colon: Secondary | ICD-10-CM

## 2013-02-21 NOTE — Patient Instructions (Signed)
We will get in touch with Dr Herbie Baltimore and Dr Shelle Iron about a possible surgery.  I will discuss possibly doing another colonoscopy with Dr Leone Payor.

## 2013-02-21 NOTE — Progress Notes (Signed)
No chief complaint on file.   HISTORY: Destiny Shelton is a 67 y.o. female who presents to the office with a large sessile polyp in her left colon.  This was found on colonoscopy and has been resected partially on 2 occasions.  She has also had multiple polyps resected throughout the remainder of her colon during this time as well.  She does have significant medical problems. Of note diabetes, cardiac disease with A. Fib, COPD on 4 L of home O2, severe lower back pain requiring narcotics and hypertension.  She is on Xaralto anticoagulation for A. Fib.  On a typical day she is able to use her exercise bike for approximately 5-10 minutes. Otherwise she does not want activity throughout the day due to limits from back pain.  She does have a history of constipation, but this has gotten better since starting MiraLax twice a day. She denies any diarrhea or blood per rectum.  She denies any weight loss.  Past Medical History  Diagnosis Date  . ALLERGIC RHINITIS   . Hypertension   . Adenomatous colon polyp   . Gastritis   . Diverticulosis of colon   . Diabetes mellitus type 2 with neurological manifestations     On Insulin  . Depression   . Osteoporosis   . Morbidly obese   . Back pain, chronic   . Anxiety   . Hyperlipemia   . COPD (chronic obstructive pulmonary disease)     Requiring Home O2 at 4L  . GERD (gastroesophageal reflux disease)   . Urosepsis 05/26/2012  . Atrial tachycardia     Mostly Sinus Tachycardia  . PAF (paroxysmal atrial fibrillation)   . Osteoarthritis   . Sleep apnea     no cpcp machine 02 at 4l/min  . External hemorrhoids   . Carotid stenosis   . Nephrolithiasis   . Headache(784.0)     tension  . Neuromuscular disorder     diabetic neuropathy  . Fibromyalgia     "pain in arms and shoulders."  . Anemia     iron deficient  . Inappropriate sinus tachycardia   . 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      Past Surgical History  Procedure Laterality Date  . Appendectomy   1963  . Cholecystectomy  1963  . Abdominal hysterectomy  1978  . Total knee arthroplasty  1997    right  . Upper gastrointestinal endoscopy  04/09/2006    w/Dilation, gastritis  . Colonoscopy w/ biopsies and polypectomy  07/22/2007, 04/28/2007    2009: diverticulosis, 2008: diverticulosis, adenomatous polyps  . Tee without cardioversion  03/24/2012    Procedure: TRANSESOPHAGEAL ECHOCARDIOGRAM (TEE);  Surgeon: Chrystie Nose, MD;  Location: [facility] OR;  Service: Cardiovascular;  Laterality: N/A;  TEE with Bubble  . Esophagogastroduodenoscopy (egd) with propofol N/A 10/01/2012    Procedure: ESOPHAGOGASTRODUODENOSCOPY (EGD) WITH PROPOFOL;  Surgeon: Iva Boop, MD;  Location: [facility] ENDOSCOPY;  Service: Endoscopy;  Laterality: N/A;  . Colonoscopy with propofol N/A 10/01/2012    Procedure: COLONOSCOPY WITH PROPOFOL;  Surgeon: Iva Boop, MD;  Location: [facility] ENDOSCOPY;  Service: Endoscopy;  Laterality: N/A;  . Colonoscopy N/A 12/21/2012    Procedure: COLONOSCOPY;  Surgeon: Iva Boop, MD;  Location: [facility] ENDOSCOPY;  Service: Endoscopy;  Laterality: N/A;  . Hot hemostasis N/A 12/21/2012    Procedure: HOT HEMOSTASIS (ARGON PLASMA COAGULATION/BICAP);  Surgeon: Iva Boop, MD;  Location: [facility];  Service: Endoscopy;  Laterality: N/A;  . Right and left cardiac catheterization  July 2013    RAP: 22 mmHg, and RVP: 46/16 mmHg, RV EDP 18; L. BP 110/11 mmHg, LVEDP 20 mmHg;  PAP 45/29 mmHg (mean 37 mmHg); PCWP 29/22 mmHg - 26 mmHg; no significant coronary disease      Current Outpatient Prescriptions  Medication Sig Dispense Refill  . ALPRAZolam (XANAX) 0.5 MG tablet Take 0.5-1 mg by mouth at bedtime as needed. For anxiety      . benzonatate (TESSALON) 200 MG capsule Take 400 mg by mouth at bedtime. For cough      . budesonide-formoterol (SYMBICORT) 160-4.5 MCG/ACT inhaler Inhale 2 puffs into the lungs 2 (two) times daily.  1 Inhaler  6  . CALCIUM-VITAMIN D PO Take 1 tablet by mouth 2 (two) times  daily.       . cyclobenzaprine (FLEXERIL) 10 MG tablet Take 10 mg by mouth at bedtime.       . docusate sodium (COLACE) 100 MG capsule Take 100 mg by mouth 2 (two) times daily.       . DULoxetine (CYMBALTA) 60 MG capsule Take 60 mg by mouth every morning.       . furosemide (LASIX) 20 MG tablet Take 20 mg by mouth 2 (two) times daily.       Marland Kitchen gabapentin (NEURONTIN) 600 MG tablet Take 600 mg by mouth 3 (three) times daily.       Marland Kitchen HYDROcodone-acetaminophen (NORCO/VICODIN) 5-325 MG per tablet Take 1 tablet by mouth every 8 (eight) hours as needed for pain.      Marland Kitchen insulin aspart (NOVOLOG) 100 UNIT/ML injection Inject 10 Units into the skin 2 (two) times daily.       . insulin glargine (LANTUS) 100 UNIT/ML injection Inject 70 Units into the skin daily before breakfast.  10 mL    . iron polysaccharides (NIFEREX) 150 MG capsule Take 1 capsule (150 mg total) by mouth daily.  30 capsule  6  . levalbuterol (XOPENEX HFA) 45 MCG/ACT inhaler Inhale 1-2 puffs into the lungs every 4 (four) hours as needed for wheezing.  1 Inhaler  12  . Multiple Vitamin (MULTIVITAMIN) tablet Take 1 tablet by mouth daily.        . Omega-3 Fatty Acids (FISH OIL) 1000 MG CAPS Take 1 capsule by mouth 3 (three) times daily.       Marland Kitchen omeprazole (PRILOSEC) 20 MG capsule Take 20 mg by mouth at bedtime.       . polyethylene glycol powder (GLYCOLAX/MIRALAX) powder Take 17 g by mouth 2 (two) times daily.       . potassium chloride (KLOR-CON) 10 MEQ CR tablet Take 10 mEq by mouth 3 (three) times daily.       . pravastatin (PRAVACHOL) 40 MG tablet Take 80 mg by mouth at bedtime.       . Rivaroxaban (XARELTO) 20 MG TABS Take 1 tablet (20 mg total) by mouth daily with supper. Restart on Friday 12/24/2012  30 tablet    . roflumilast (DALIRESP) 500 MCG TABS tablet Take 500 mcg by mouth daily.       Marland Kitchen tiotropium (SPIRIVA) 18 MCG inhalation capsule Place 1 capsule (18 mcg total) into inhaler and inhale daily.  30 capsule  6  . valsartan (DIOVAN)  160 MG tablet Take 80 mg by mouth at bedtime.       . verapamil (VERELAN PM) 360 MG 24 hr capsule Take 1 capsule (360 mg total) by mouth daily.  30 capsule  9   No current facility-administered  medications for this visit.      Allergies  Allergen Reactions  . Aspirin Shortness Of Breath  . Nsaids Shortness Of Breath      Family History  Problem Relation Age of Onset  . Heart disease Mother   . Heart disease Father   . Heart disease Brother   . Stroke Mother   . Stroke Brother   . Skin cancer Brother   . Colon cancer Neg Hx   . Colon polyps Sister   . Diabetes Sister   . Diabetes Brother   . Irritable bowel syndrome Daughter       History   Social History  . Marital Status: Married    Spouse Name: N/A    Number of Children: 2  . Years of Education: HS   Occupational History  . Disability    Social History Main Topics  . Smoking status: Former Smoker -- 2.00 packs/day for 30 years    Types: Cigarettes    Quit date: 06/02/1994  . Smokeless tobacco: Never Used  . Alcohol Use: No  . Drug Use: No  . Sexual Activity: No   Other Topics Concern  . None   Social History Narrative   She is a married mother of 2, grandmother of 6, great-grandmother of 2. She is with her husband here today. She does not really exercise as she is on home oxygen and has baseline dyspnea.    Does not smoke. Does not drink. She quit smoking somewhere between 10-15 years ago. She is now trying to work on picking up her activity level. She is trying to I think more than likely get back into pulmonary rehabilitation potentially.       Daily caffeine.        REVIEW OF SYSTEMS - PERTINENT POSITIVES ONLY: Review of Systems - General ROS: positive for  - fatigue negative for - chills or fever Respiratory ROS: positive for - cough, shortness of breath and wheezing negative for - hemoptysis Cardiovascular ROS: positive for - dyspnea on exertion and irregular heartbeat negative for - chest  pain Gastrointestinal ROS: positive for - constipation negative for - abdominal pain or blood in stools Genito-Urinary ROS: no dysuria, trouble voiding, or hematuria  EXAM: Filed Vitals:   02/21/13 1014  BP: 128/68  Pulse: 86  Temp: 97.5 F (36.4 C)  Resp: 20    Gen:  No acute distress.  Well nourished and well groomed.   Neurological: Alert and oriented to person, place, and time. Coordination normal.  Head: Normocephalic and atraumatic.  Eyes: Conjunctivae are normal. Pupils are equal, round, and reactive to light. No scleral icterus.  Neck: Normal range of motion. Cardiovascular: Normal rate, irregular rhythm Respiratory: Effort normal.  No respiratory distress. No chest wall tenderness. Breath sounds normal.   GI: Soft. Bowel sounds are normal. The abdomen is soft and nontender.  There is no rebound and no guarding. there is an upper midline well-healed abdominal scar. Musculoskeletal: Normal range of motion. Extremities are nontender.  Skin: Skin is warm and dry. No rash noted. No diaphoresis. No erythema. No pallor. No clubbing, cyanosis, or edema.   Psychiatric: Normal mood and affect. Behavior is normal. Judgment and thought content normal.    LABORATORY RESULTS: Available labs are reviewed   RADIOLOGY RESULTS:   Images and reports are reviewed.    ASSESSMENT AND PLAN:  Destiny Shelton is a 67 year old female with multiple medical problems. She was found to have colonic polyposis and has undergone  2 colonoscopies to remove her colonic polyps. She has a large sessile polyp in her left colon that has shown high-grade dysplasia on pathology. It has been incompletely resected and ablated twice. We discussed her options. She obviously has the option to do nothing.  Otherwise, she can continue with colonoscopies and partial resection until the polyp is clear or she develops invasive carcinoma. She could also undergo a surgical resection with removal of that portion of the  colon and anastomosis. This can most likely be done laparoscopically but was still be a major operation. I used the ACS NSQIP risk assessment calculator to evaluate her risk further.  According to the risk report, she has an approximately 28% risk of serious complication and 39% risk of any complication. She has approximately 15% risk of wound infection and 8% risk of urinary tract infection. She has approximately 7% risk of return to the OR, 3% risk of death and approximately 44% risk of needing discharge to a rehabilitation facility. I think her risk may actually be a little higher due to her anticoagulation and risk of bleeding.  After discussing this with the patient, the patient would like further input from her pulmonologist and cardiologist. We discussed possibly performing another colonoscopy in approximately 2 months to evaluate progression of this polyp. After getting this information noted above, she will make a final decision about surgery. She understands the risk of waiting, which are mainly that the polyp will get larger and transform into cancer and that her risk of surgery will increase to 2 worsening of her other medical conditions.   Vanita Panda, MD Colon and Rectal Surgery / General Surgery [facility]      Visit Diagnoses: 1. Polyp of colon     Primary Care Physician: Kari Baars, MD

## 2013-02-21 NOTE — Telephone Encounter (Signed)
Destiny Shelton, please call this pt and let her know needs ov for preop pulmonary evaluation.

## 2013-02-21 NOTE — Telephone Encounter (Signed)
Spoke with patient-- Pt declined appt at this time--states that she is not wanting to schedule anything at this time d/t postponing surgery. Patient states that her GI Specialist is wanting to possibly do another Colonoscopy to re-evaluate colon before proceeding with this surgery since she is at such high risk of pulmonary complications.   Medical Clearance request letter has been faxed back to [facility] ATTN: Huntley Dec @ [phone] stating pts requests/decline.   Will send to [facility] as FYI

## 2013-02-23 ENCOUNTER — Telehealth: Payer: Self-pay | Admitting: *Deleted

## 2013-02-23 ENCOUNTER — Telehealth (INDEPENDENT_AMBULATORY_CARE_PROVIDER_SITE_OTHER): Payer: Self-pay

## 2013-02-23 NOTE — Telephone Encounter (Signed)
Per  Dr  Herbie Baltimore ,  Pt has decide not to have laparoscopic partial colectomy by  , will not address clearance at present time. Notified Huntley Dec.

## 2013-02-23 NOTE — Telephone Encounter (Signed)
I received a call from Dr Elissa Hefty office.  Destiny Shelton states he was informed the pt is postponing surgery for now.  They are not proceeding with cardiac clearance at this time.  I will call the pt and see if this is her decision.  I have not heard this from anyone as of yet.

## 2013-02-23 NOTE — Telephone Encounter (Signed)
I called the pt today.  She states she and Dr Maisie Fus discussed that she will start with a colonoscopy 1st to see if there is any change.  Her pulmonologist will have to see her prior to being cleared for surgery.

## 2013-03-08 DIAGNOSIS — Z23 Encounter for immunization: Secondary | ICD-10-CM | POA: Diagnosis not present

## 2013-03-29 ENCOUNTER — Encounter: Payer: Self-pay | Admitting: Internal Medicine

## 2013-03-29 ENCOUNTER — Ambulatory Visit (INDEPENDENT_AMBULATORY_CARE_PROVIDER_SITE_OTHER): Payer: Medicare Other | Admitting: Internal Medicine

## 2013-03-29 VITALS — BP 110/60 | HR 104 | Ht 65.0 in | Wt 229.2 lb

## 2013-03-29 DIAGNOSIS — D126 Benign neoplasm of colon, unspecified: Secondary | ICD-10-CM | POA: Diagnosis not present

## 2013-03-29 NOTE — Patient Instructions (Signed)
We will be in touch after consulting with Dr. Romie Levee regarding polyp removal.  I appreciate the opportunity to care for you.

## 2013-03-30 NOTE — Progress Notes (Signed)
  Subjective:    Patient ID: Destiny Shelton, female    DOB: 06-09-1945, 67 y.o.   MRN: [id number]  HPI The patient is here with her husband to review her situation re: sigmoid polyp with high grade dysplasia. She has seen Dr. Maisie Fus of surgery and it has been requested that I consider another attempt at colonoscopic polypectomy. She has an appointment with Dr. Shelle Iron - pulmonary - 11/20. She says dyspnea is stable.  No GI sxs. Medications, allergies, past medical history, past surgical history, family history and social history are reviewed and updated in the EMR.  Review of Systems As per HPI    Objective:   Physical Exam Obese, chroinically ill on O2 NAD      Assessment & Plan:

## 2013-03-30 NOTE — Assessment & Plan Note (Addendum)
Given her co-morbidities a repeat attempt at colonoscopic polypectomy is reasonable. I have concerns re: ability to completely remove given prior cautery and APC but think it is possible. I think she could be a good candidate for combined colonoscopic polypectomy - laparoscopy/laparotomy procedure though she would be higher risk for general anesthesia and complications. I think laparoscopic observation could reduce risk of perforation or at at least allow closure if that occurs. I have explaine I have knowledge but no experience with this approach.  I will discuss with Dr. Maisie Fus and we will await pulmonary visit and addressing of operative and post-operative risks/care with respect to ling disease and she would need cardiology input also.  I explained that this polyp may never transform to cancer in her lifetime. She told me the idea that something like this is growing inside of her and could become cancer is motivating her to have definitive removal if possible.  ZO:XWRU,E Riley Lam, MD Destiny Levee, MD, Marcelyn Bruins, MD and Bryan Lemma, MD  15 mins time spent with patient today

## 2013-04-04 DIAGNOSIS — E119 Type 2 diabetes mellitus without complications: Secondary | ICD-10-CM | POA: Diagnosis not present

## 2013-04-04 DIAGNOSIS — H40009 Preglaucoma, unspecified, unspecified eye: Secondary | ICD-10-CM | POA: Diagnosis not present

## 2013-04-04 DIAGNOSIS — Z961 Presence of intraocular lens: Secondary | ICD-10-CM | POA: Diagnosis not present

## 2013-04-04 DIAGNOSIS — H43819 Vitreous degeneration, unspecified eye: Secondary | ICD-10-CM | POA: Diagnosis not present

## 2013-04-07 ENCOUNTER — Other Ambulatory Visit: Payer: Self-pay

## 2013-04-21 ENCOUNTER — Encounter: Payer: Self-pay | Admitting: Pulmonary Disease

## 2013-04-21 ENCOUNTER — Ambulatory Visit (INDEPENDENT_AMBULATORY_CARE_PROVIDER_SITE_OTHER): Payer: Medicare Other | Admitting: Pulmonary Disease

## 2013-04-21 VITALS — BP 116/56 | HR 106 | Temp 98.1°F | Ht 65.0 in | Wt 231.8 lb

## 2013-04-21 DIAGNOSIS — J449 Chronic obstructive pulmonary disease, unspecified: Secondary | ICD-10-CM | POA: Diagnosis not present

## 2013-04-21 DIAGNOSIS — J9610 Chronic respiratory failure, unspecified whether with hypoxia or hypercapnia: Secondary | ICD-10-CM

## 2013-04-21 DIAGNOSIS — R0602 Shortness of breath: Secondary | ICD-10-CM | POA: Diagnosis not present

## 2013-04-21 NOTE — Progress Notes (Signed)
  Subjective:    Patient ID: Destiny Shelton, female    DOB: 04-17-1946, 67 y.o.   MRN: [id number]  HPI Patient comes in today for followup of her chronic respiratory failure that is multifactorial.  This is felt to be secondary to mild COPD, diastolic heart failure, and finally shunting of unknown source.  She has been staying on her medications compliantly, and has been having both good and bad days.  She has a mild cough with scant mucus, and does not feel congested in her chest.  She has been weighing herself daily and making adjustments to her diuretics.  She does not have edema today, but her weight is up a few pounds from last visit.   Review of Systems  Constitutional: Negative for fever and unexpected weight change.  HENT: Negative for congestion, dental problem, ear pain, nosebleeds, postnasal drip, rhinorrhea, sinus pressure, sneezing, sore throat and trouble swallowing.   Eyes: Negative for redness and itching.  Respiratory: Positive for cough ( in the evening with green/yellow mucus) and shortness of breath. Negative for chest tightness and wheezing.   Cardiovascular: Negative for palpitations and leg swelling.  Gastrointestinal: Negative for nausea and vomiting.  Genitourinary: Negative for dysuria.  Musculoskeletal: Negative for joint swelling.  Skin: Negative for rash.  Neurological: Negative for headaches.  Hematological: Does not bruise/bleed easily.  Psychiatric/Behavioral: Negative for dysphoric mood. The patient is not nervous/anxious.        Objective:   Physical Exam Obese female in no acute distress Nose without purulence or discharge noted Neck without lymphadenopathy or thyromegaly Chest with mild decreased breath sounds, no wheezing Cardiac exam with regular rate and rhythm, 2/6 systolic murmur Lower extremities with minimal edema, no cyanosis Alert and oriented, moves all 4 extremities.       Assessment & Plan:

## 2013-04-21 NOTE — Patient Instructions (Signed)
No change in medications Work on weight loss followup with me in 6mos, but call if having more breathing issues.

## 2013-04-21 NOTE — Assessment & Plan Note (Signed)
This is multifactorial, and secondary to her obesity, mild COPD, diastolic heart failure, and finally probable shunting.  She is to continue on supplemental oxygen.  I have stressed to her the importance of aggressive weight loss, and how it is likely to improve her quality of life and oxygen needs.

## 2013-04-21 NOTE — Assessment & Plan Note (Signed)
The patient has mild airflow obstruction on her PFTs, yet she is currently on a very aggressive regimen that I suspect is overkill.  However, she feels that she has made definite improvement with the addition of each medication.  Will continue her current regimen for now.

## 2013-05-03 ENCOUNTER — Ambulatory Visit (INDEPENDENT_AMBULATORY_CARE_PROVIDER_SITE_OTHER): Payer: Medicare Other | Admitting: General Surgery

## 2013-05-03 ENCOUNTER — Encounter (INDEPENDENT_AMBULATORY_CARE_PROVIDER_SITE_OTHER): Payer: Self-pay | Admitting: General Surgery

## 2013-05-03 ENCOUNTER — Encounter (INDEPENDENT_AMBULATORY_CARE_PROVIDER_SITE_OTHER): Payer: Self-pay

## 2013-05-03 VITALS — BP 120/60 | HR 108 | Temp 99.2°F | Resp 17 | Ht 65.0 in | Wt 231.6 lb

## 2013-05-03 DIAGNOSIS — D126 Benign neoplasm of colon, unspecified: Secondary | ICD-10-CM | POA: Diagnosis not present

## 2013-05-03 NOTE — Patient Instructions (Signed)
We will talk to your cardiologist and pulmonologist about surgery.  We will notify you once we have this information

## 2013-05-03 NOTE — Progress Notes (Signed)
Destiny Shelton is a 67 y.o. female who is here for a follow up visit regarding her sigmoid polyp.  I was asked by Dr Leone Payor to consider a joint lap assisted procedure for endoscopic resection of her sigmoid polyp.  She is here today to discuss this in further detail.  Objective: Filed Vitals:   05/03/13 1104  BP: 120/60  Pulse: 108  Temp: 99.2 F (37.3 C)  Resp: 17    General appearance: alert and cooperative Resp: wheezes base - right Cardio: irregularly irregular rhythm GI: abnormal findings:  incisional hernia   Assessment and Plan: Destiny Shelton is a 67 y.o. F with a recurrent sigmoid polyp.  Dr Leone Payor approached me about possibly doing a lap assisted endoscopic resection. I think this is reasonable if her pulmonologist and cardiologist agree that she is stable to undergo general anesthesia. We discussed the risk of laparoscopic surgery in people with pulmonary disease, as the CO2 insufflation can cause an acidosis if the patient is not drop really ventilated.  I will have her pulmonologist and cardiologist evaluate her for this general anesthesia. If they feel will be reasonable to proceed, we will plan on a joint resection with me and Dr. Leone Payor.  We discussed that he would be removing the polyp endoscopically and that I would be assisting him with laparoscopic manipulation of the colon and evaluation for any perforation afterwards. If there was a perforation in the colon I think this could be primarily repaired as long as the patient has undergone a full bowel prep. We did discuss that there is a slight possibility that we would need to do a complete colon resection.  I did notify her that I have never performed this exact procedure before, but I have read about it and have performed all aspects of this procedure separately on many occasions.    Vanita Panda, MD [facility], [place] [phone]

## 2013-05-05 ENCOUNTER — Encounter (INDEPENDENT_AMBULATORY_CARE_PROVIDER_SITE_OTHER): Payer: Self-pay

## 2013-05-05 NOTE — Progress Notes (Unsigned)
Below is the response from Dr Herbie Baltimore to cardiac clearance request.    She can be off of Xarelto - stop ~2-3 days pre-op & restart 1-2 days post-op.    Marykay Lex, MD

## 2013-05-06 ENCOUNTER — Telehealth (INDEPENDENT_AMBULATORY_CARE_PROVIDER_SITE_OTHER): Payer: Self-pay

## 2013-05-06 ENCOUNTER — Telehealth: Payer: Self-pay | Admitting: Cardiology

## 2013-05-06 ENCOUNTER — Encounter: Payer: Self-pay | Admitting: Cardiology

## 2013-05-06 ENCOUNTER — Encounter: Payer: Self-pay | Admitting: *Deleted

## 2013-05-06 NOTE — Telephone Encounter (Signed)
Letter written -- in Letter Section.  Forwarded to PCP, & surgeon as well as her RN.  Marykay Lex, MD

## 2013-05-06 NOTE — Telephone Encounter (Signed)
Sent message to Ivory Broad

## 2013-05-06 NOTE — Telephone Encounter (Signed)
DR.Thomas woul like a note stating that this patientis cleared from a cardiac stand point for general anethesia.. Can send another msg to Sarah(nurse) in basket and she can print it off .. Dr. Herbie Baltimore sent a msg saying she can be off of her Xarelto

## 2013-05-06 NOTE — Telephone Encounter (Signed)
Will defer to Dr. Herbie Baltimore and Burt Knack, RN.

## 2013-05-06 NOTE — Telephone Encounter (Signed)
LM for Dr Herbie Baltimore to send another message to Sara's basket to notify us that the pt is cleared from a cardiac standpoint to under go general anesthesia per Dr Maurine Minister request. Dr Maisie Fus said once we receive this in writing then we can get pt scheduled for surgery.

## 2013-05-13 ENCOUNTER — Other Ambulatory Visit: Payer: Self-pay | Admitting: Pulmonary Disease

## 2013-05-23 ENCOUNTER — Telehealth: Payer: Self-pay

## 2013-05-23 DIAGNOSIS — Z1231 Encounter for screening mammogram for malignant neoplasm of breast: Secondary | ICD-10-CM | POA: Diagnosis not present

## 2013-05-23 NOTE — Telephone Encounter (Signed)
Message copied by Annett Fabian on Mon May 23, 2013 11:03 AM ------      Message from: Iva Boop      Created: Sat May 21, 2013  6:19 AM      Regarding: needs appt Jan       Ask her to come back to see me            Let her know that cardiology opinion about surgery risks has led Dr. Maisie Fus and me to think too risky for surgery - will discuss at REV ------

## 2013-05-23 NOTE — Telephone Encounter (Signed)
Left message for patient to call back to discuss scheduling jan appt

## 2013-05-23 NOTE — Telephone Encounter (Signed)
Patient is scheduled for 06/22/13 10:00

## 2013-05-25 DIAGNOSIS — R809 Proteinuria, unspecified: Secondary | ICD-10-CM | POA: Diagnosis not present

## 2013-05-25 DIAGNOSIS — I10 Essential (primary) hypertension: Secondary | ICD-10-CM | POA: Diagnosis not present

## 2013-05-25 DIAGNOSIS — M810 Age-related osteoporosis without current pathological fracture: Secondary | ICD-10-CM | POA: Diagnosis not present

## 2013-05-25 DIAGNOSIS — E785 Hyperlipidemia, unspecified: Secondary | ICD-10-CM | POA: Diagnosis not present

## 2013-05-25 DIAGNOSIS — E1149 Type 2 diabetes mellitus with other diabetic neurological complication: Secondary | ICD-10-CM | POA: Diagnosis not present

## 2013-05-25 DIAGNOSIS — R82998 Other abnormal findings in urine: Secondary | ICD-10-CM | POA: Diagnosis not present

## 2013-05-27 ENCOUNTER — Other Ambulatory Visit: Payer: Self-pay | Admitting: Pulmonary Disease

## 2013-06-01 DIAGNOSIS — N6009 Solitary cyst of unspecified breast: Secondary | ICD-10-CM | POA: Diagnosis not present

## 2013-06-05 ENCOUNTER — Other Ambulatory Visit: Payer: Self-pay | Admitting: Cardiology

## 2013-06-06 DIAGNOSIS — E785 Hyperlipidemia, unspecified: Secondary | ICD-10-CM | POA: Diagnosis not present

## 2013-06-06 DIAGNOSIS — G609 Hereditary and idiopathic neuropathy, unspecified: Secondary | ICD-10-CM | POA: Diagnosis not present

## 2013-06-06 DIAGNOSIS — E1149 Type 2 diabetes mellitus with other diabetic neurological complication: Secondary | ICD-10-CM | POA: Diagnosis not present

## 2013-06-06 DIAGNOSIS — I2789 Other specified pulmonary heart diseases: Secondary | ICD-10-CM | POA: Diagnosis not present

## 2013-06-06 DIAGNOSIS — J449 Chronic obstructive pulmonary disease, unspecified: Secondary | ICD-10-CM | POA: Diagnosis not present

## 2013-06-06 DIAGNOSIS — I10 Essential (primary) hypertension: Secondary | ICD-10-CM | POA: Diagnosis not present

## 2013-06-06 DIAGNOSIS — Z0000 Encounter for general adult medical examination without abnormal findings: Secondary | ICD-10-CM | POA: Diagnosis not present

## 2013-06-06 DIAGNOSIS — I509 Heart failure, unspecified: Secondary | ICD-10-CM | POA: Diagnosis not present

## 2013-06-08 ENCOUNTER — Encounter: Payer: Self-pay | Admitting: Cardiology

## 2013-06-10 ENCOUNTER — Encounter: Payer: Self-pay | Admitting: Cardiology

## 2013-06-10 ENCOUNTER — Ambulatory Visit (INDEPENDENT_AMBULATORY_CARE_PROVIDER_SITE_OTHER): Payer: Medicare Other | Admitting: Cardiology

## 2013-06-10 VITALS — BP 102/58 | HR 101 | Ht 65.0 in | Wt 229.8 lb

## 2013-06-10 DIAGNOSIS — I498 Other specified cardiac arrhythmias: Secondary | ICD-10-CM

## 2013-06-10 DIAGNOSIS — R000 Tachycardia, unspecified: Secondary | ICD-10-CM

## 2013-06-10 DIAGNOSIS — R0602 Shortness of breath: Secondary | ICD-10-CM | POA: Diagnosis not present

## 2013-06-10 DIAGNOSIS — Z01810 Encounter for preprocedural cardiovascular examination: Secondary | ICD-10-CM

## 2013-06-10 DIAGNOSIS — I272 Pulmonary hypertension, unspecified: Secondary | ICD-10-CM

## 2013-06-10 DIAGNOSIS — I4891 Unspecified atrial fibrillation: Secondary | ICD-10-CM | POA: Diagnosis not present

## 2013-06-10 DIAGNOSIS — I10 Essential (primary) hypertension: Secondary | ICD-10-CM

## 2013-06-10 DIAGNOSIS — I4711 Inappropriate sinus tachycardia, so stated: Secondary | ICD-10-CM

## 2013-06-10 DIAGNOSIS — I2789 Other specified pulmonary heart diseases: Secondary | ICD-10-CM

## 2013-06-10 DIAGNOSIS — I480 Paroxysmal atrial fibrillation: Secondary | ICD-10-CM

## 2013-06-10 MED ORDER — IRBESARTAN 150 MG PO TABS
150.0000 mg | ORAL_TABLET | Freq: Every day | ORAL | Status: DC
Start: 2013-06-10 — End: 2013-07-13

## 2013-06-10 NOTE — Progress Notes (Signed)
Patient ID: Destiny Shelton, female   DOB: 09-02-1945, 68 y.o.   MRN: [id number] PCP: Janalyn Rouse, MD  Clinic Note: Chief Complaint  Patient presents with  . Follow-up    6 month ROV; DOE; lightheadedness/dizziness occasionally (positional - if takes oxygen off); bilat LE & ankle edema   HPI: Destiny Shelton is a 68 y.o. female with a PMH below who presents today for followup for her dyspnea, pulmonary hypertension, sinus tachycardia AND recently PAF. She pretty much has chronic dyspnea that is multifactorial related to COPD, obesity, moderately elevated pulmonary pressures, likely grade 1 diastolic dysfunction, inappropriate tachycardia and intermittent he has PAF.  She has had a right and left heart catheterization, 2-D transthoracic echocardiography and transesophageal echocardiography as well as chest CTs and oblique cardiac MRI to evaluate her dyspnea.  See below.  I last saw her in August 2014, she was doing relatively well with the increased dose of Lasix.   She was going through GI evaluation for possible cancerous polyp that.  I was asked to provide Cardiac Risk Assessment - that apparently led to her procedure being cancelled.  An intraperitoneal surgery (not colonoscopy) is considered High Risk, she has Diastolic HF and Insulin requiring DM - that combination would make her High Risk for adverse cardiac events.   Extenuating circumstances are: From a negative aspect her extensive lung disease and obesity; from a positive aspect relatively normal coronary arteries on her cardiac cath one half years ago.   Interval History: Today she comes in still doing quite well.  She has her baseline shortness of breath but seems to be better over the last couple months.  As always, she sleeps in a recliner, which she has done for many years.  She says the swelling is much better -- she has been adjusting her Lasix dose according to her weights and symptoms. She's currently taking essentially a  milligrams twice a day.   She denies any chest pain or pressure with rest or exertion.  She has not had any more episodes of irregular heartbeats, just her standard fashion a heart rate.  She does note that is not her heart rate gets down low into the 70s and 80s.  She does feel better when her heart rate is lower.  She is trying to get into doing something with activity, but is finding it difficult to do so.    The remainder of Cardiovascular ROS is as follows: positive for - dyspnea on exertion, orthopnea, paroxysmal nocturnal dyspnea, rapid heart rate and shortness of breath negative for - chest pain, edema, irregular heartbeat, loss of consciousness, murmur or palpitations  With her history of guaiac positive stools and anemia being on Xarelto, she denies any melena, hematochezia or hematuria.  No nosebleeds.  No TIA or amaurosis fugax symptoms.  No claudication symptoms.  Past Medical History  Diagnosis Date  . ALLERGIC RHINITIS   . Hypertension   . Adenomatous colon polyp   . Gastritis   . Diverticulosis of colon   . Diabetes mellitus type 2 with neurological manifestations     On Insulin  . Depression   . Osteoporosis   . Morbidly obese   . Back pain, chronic   . Anxiety   . Hyperlipemia   . COPD (chronic obstructive pulmonary disease)     Requiring Home O2 at 4L  . GERD (gastroesophageal reflux disease)   . Urosepsis 05/26/2012  . Atrial tachycardia     Mostly Sinus Tachycardia  .  PAF (paroxysmal atrial fibrillation)   . Osteoarthritis   . Sleep apnea     no cpcp machine 02 at 4l/min  . External hemorrhoids   . Carotid stenosis   . Nephrolithiasis   . Headache(784.0)     tension  . Neuromuscular disorder     diabetic neuropathy  . Fibromyalgia     "pain in arms and shoulders."  . Anemia     iron deficient  . Inappropriate sinus tachycardia   . 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    Prior Cardiac Evaluation and Past Surgical History: Past Surgical History  Procedure  Laterality Date  . Appendectomy  1963  . Cholecystectomy  1963  . Abdominal hysterectomy  1978  . Total knee arthroplasty  1997    right  . Upper gastrointestinal endoscopy  04/09/2006    w/Dilation, gastritis  . Colonoscopy w/ biopsies and polypectomy  07/22/2007, 04/28/2007    2009: diverticulosis, 2008: diverticulosis, adenomatous polyps  . Tee without cardioversion  03/24/2012    Procedure: TRANSESOPHAGEAL ECHOCARDIOGRAM (TEE);  Surgeon: Pixie Casino, MD;  Location: [facility] OR;  Service: Cardiovascular;  Laterality: N/A;  TEE with Bubble  . Esophagogastroduodenoscopy (egd) with propofol N/A 10/01/2012    Procedure: ESOPHAGOGASTRODUODENOSCOPY (EGD) WITH PROPOFOL;  Surgeon: Gatha Mayer, MD;  Location: [facility] ENDOSCOPY;  Service: Endoscopy;  Laterality: N/A;  . Colonoscopy with propofol N/A 10/01/2012    Procedure: COLONOSCOPY WITH PROPOFOL;  Surgeon: Gatha Mayer, MD;  Location: [facility]OSCOPY;  Service: Endoscopy;  Laterality: N/A;  . Colonoscopy N/A 12/21/2012    Procedure: COLONOSCOPY;  Surgeon: Gatha Mayer, MD;  Location: [facility] ENDOSCOPY;  Service: Endoscopy;  Laterality: N/A;  . Hot hemostasis N/A 12/21/2012    Procedure: HOT HEMOSTASIS (ARGON PLASMA COAGULATION/BICAP);  Surgeon: Gatha Mayer, MD;  Location: [facility];  Service: Endoscopy;  Laterality: N/A;  . Right and left cardiac catheterization  July 2013    RAP: 22 mmHg, and RVP: 46/16 mmHg, RV EDP 18; L. BP 110/11 mmHg, LVEDP 20 mmHg;  PAP 45/29 mmHg (mean 37 mmHg); PCWP 29/22 mmHg - 26 mmHg; no significant coronary disease   MEDICATIONS & ALLERGIES REVIEWED IN EPIC  History   Social History Narrative   She is a married mother of 2, grandmother of 64, great-grandmother of 2. She is with her husband here today. She does not really exercise as she is on home oxygen and has baseline dyspnea.    Does not smoke. Does not drink. She quit smoking somewhere between 10-15 years ago. She is now trying to work on picking up her activity  level. She is trying to I think more than likely get back into pulmonary rehabilitation potentially.       Daily caffeine.     ROS: A comprehensive Review of Systems - Negative except Pertinent positives noted above.  She does have some arthritis pains that bother her intermittently.  PHYSICAL EXAM BP 102/58  Pulse 101  Ht 5\' 5"  (1.651 m)  Wt 229 lb 12.8 oz (104.237 kg)  BMI 38.24 kg/m2 General appearance: A&O x3; cooperative, appears stated age, no distress, morbidly obese and Chronically ill-appearing but healthier than her last visit.  Well-groomed.  Obese, but no longer morbidly obese - mostly truncal.  Has chronic oxygen from her concentrator.  No increased work of breathing. Neck: no carotid bruit, no JVD and supple, symmetrical, trachea midline Lungs: Essentially distant breath sounds throughout, mostly CTA B. with mild interstitial sounds.  No W./R./R.  Nonlabored Heart: Mildly tachycardic;  Regular rhythm.  Normal S1-S2, no M./R./G.  Unable to palpate PMI due to body habitus. Abdomen: soft, non-tender; bowel sounds normal; no masses,  no organomegaly and Unable to palpate it does have due to body habitus.  No HJR Extremities: edema Trace, no ulcers, gangrene or trophic changes and Mild spider veins, no significant varicosities Pulses: 2+ and symmetric Skin: Skin color, texture, turgor normal. No rashes or lesions Neurologic: Grossly normal  [id number] today: Yes Rate:101 , Rhythm: Sinus tachycardia, otherwise normal   Recent Labs: None recently  ASSESSMENT / PLAN: CHRONIC DYSPNEA Multifactorial. Definitely help with increased dose of diuretic. We've readdressed the concept of sliding scale Lasix. He may actually be losing some weight at home, as her weight is down 2 pounds below his band. Per her home scale she has also been losing weight. They would consider current dry weight but is now. Her standing Lasix dose is 80 twice a day. I be happy to refill her prescription if  need be.  PAF (paroxysmal atrial fibrillation) Thankfully, she doesn't seem to have any recent recurrences of her A. fib. Provided she stays elevated she is usually okay from a cardiac standpoint. She remains on high-dose verapamil for rate control, and his antiplatelet with Xarelto. No bleeding.  Inappropriate sinus tachycardia Relatively well-controlled resting baseline tachycardia, as her current heart rate is 101. Continue verapamil. Did not tolerate beta blocker, and diltiazem is not effective.  Pulmonary hypertension On calcium channel blocker and home oxygen. An ARB plus diuretic for afterload reduction to reduce the contribution from increased left atrial pressure/LV filling pressures.  Preoperative cardiovascular examination As previously mentioned in my letter that I wrote in the fall. Based on the revise cardiac risk assessment, she would be considered "High-Risk " for adverse cardiac events. However the lack of any significant coronary disease would mak the likelihood of MI much less likely. Her risks would more likely be related to heart failure from perioperative atrial fibrillation, or problems with extubation given her lung disease. Do not think these risks are prohibitive for her having a procedure or even an intraperitoneal operation.  If the plan is to proceed with an operation, I would recommend an echocardiogram in order to estimate her pulmonary pressures.  HYPERTENSION Well-controlled. Actually like blood pressures in this range for her provided she hasn't had any significant orthostatic symptoms. Her insurance company is no longer to be covering Diovan, we will simply sutured to irbesartan which is covered and has the lowest co-pay.    Orders Placed This Encounter  Procedures  . EKG 12-Lead   Followup: 6 months  Jandel Patriarca W. Ellyn Hack, M.D., M.S. [facility] [address] [place], [place]  [id number]  [phone] Pager #  [phone]

## 2013-06-10 NOTE — Patient Instructions (Addendum)
  Our Discussion about  your possible surgery- will be dictated to your surgeon.  Will change DIOVAN 320 MG TO IRBESARTAN 150 MG ONE TABLET BY MOUTH DAILY.  Your physician wants you to follow-up in 6 month Dr HARDING---,30 min visit.  You will receive a reminder letter in the mail two months in advance. If you don't receive a letter, please call our office to schedule the follow-up appointment.

## 2013-06-11 ENCOUNTER — Other Ambulatory Visit: Payer: Self-pay | Admitting: Pulmonary Disease

## 2013-06-12 NOTE — Assessment & Plan Note (Signed)
On calcium channel blocker and home oxygen. An ARB plus diuretic for afterload reduction to reduce the contribution from increased left atrial pressure/LV filling pressures.

## 2013-06-12 NOTE — Assessment & Plan Note (Signed)
Relatively well-controlled resting baseline tachycardia, as her current heart rate is 101. Continue verapamil. Did not tolerate beta blocker, and diltiazem is not effective.

## 2013-06-12 NOTE — Assessment & Plan Note (Addendum)
Well-controlled. Actually like blood pressures in this range for her provided she hasn't had any significant orthostatic symptoms. Her insurance company is no longer to be covering Diovan, we will simply sutured to irbesartan which is covered and has the lowest co-pay.

## 2013-06-12 NOTE — Assessment & Plan Note (Signed)
Multifactorial. Definitely help with increased dose of diuretic. We've readdressed the concept of sliding scale Lasix. He may actually be losing some weight at home, as her weight is down 2 pounds below his band. Per her home scale she has also been losing weight. They would consider current dry weight but is now. Her standing Lasix dose is 80 twice a day. I be happy to refill her prescription if need be.

## 2013-06-12 NOTE — Assessment & Plan Note (Signed)
Thankfully, she doesn't seem to have any recent recurrences of her A. fib. Provided she stays elevated she is usually okay from a cardiac standpoint. She remains on high-dose verapamil for rate control, and his antiplatelet with Xarelto. No bleeding.

## 2013-06-12 NOTE — Assessment & Plan Note (Signed)
As previously mentioned in my letter that I wrote in the fall. Based on the revise cardiac risk assessment, she would be considered "High-Risk " for adverse cardiac events. However the lack of any significant coronary disease would mak the likelihood of MI much less likely. Her risks would more likely be related to heart failure from perioperative atrial fibrillation, or problems with extubation given her lung disease. Do not think these risks are prohibitive for her having a procedure or even an intraperitoneal operation.  If the plan is to proceed with an operation, I would recommend an echocardiogram in order to estimate her pulmonary pressures.

## 2013-06-17 DIAGNOSIS — Z1212 Encounter for screening for malignant neoplasm of rectum: Secondary | ICD-10-CM | POA: Diagnosis not present

## 2013-06-22 ENCOUNTER — Encounter: Payer: Self-pay | Admitting: Internal Medicine

## 2013-06-22 ENCOUNTER — Ambulatory Visit (INDEPENDENT_AMBULATORY_CARE_PROVIDER_SITE_OTHER): Payer: Medicare Other | Admitting: Internal Medicine

## 2013-06-22 VITALS — BP 100/40 | HR 112 | Ht 65.0 in | Wt 230.0 lb

## 2013-06-22 DIAGNOSIS — D126 Benign neoplasm of colon, unspecified: Secondary | ICD-10-CM

## 2013-06-22 NOTE — Patient Instructions (Signed)
My office will contact you about the timing of your next colonoscopy - to be done by summer 2015. Please let me know if you develop changes in bowel habits, abdominal pain or rectal bleeding. I appreciate the opportunity to care for you.  Gatha Mayer, MD, [facility]

## 2013-06-22 NOTE — Assessment & Plan Note (Signed)
Plan repeat colonoscopy later this year

## 2013-06-22 NOTE — Assessment & Plan Note (Signed)
Given her significant comorbidities the plan is to reassess this later in 2015. If it turns for cancer, then surgical resection would make the most sense. Question if other options like radiation would be possible. Probably not.

## 2013-06-23 NOTE — Progress Notes (Signed)
Patient ID: Destiny Shelton, female   DOB: 1945-08-16, 68 y.o.   MRN: [id number]         Cobie and her husband are here. She is stable. She has a sigmoid adenoma with high-grade dysplasia that I have not been able to remove. Last partially removed and ablated 11/2012. We had considered trying to snare with colonoscope using laparoscopy to manipulate the bowel and facilitate complete snaring and to close any defects that arose.  At this point Dr. Marcello Moores and I think, based upon co-morbidities, cardiology and pulmonary evaluations and operative risk that GA and laparoscopy/laparotomy not appropriate. If definite cancer - would re-consider. I have explained to patient and husband and we anticipate colonoscopy in early summer to reassess -    MM:NOTR,R DOUGLAS, MD Leighton Ruff, MD

## 2013-07-08 ENCOUNTER — Inpatient Hospital Stay (HOSPITAL_COMMUNITY)
Admission: EM | Admit: 2013-07-08 | Discharge: 2013-07-13 | DRG: 871 | Disposition: A | Discharge status: Home Health | Payer: Medicare Other | Attending: Internal Medicine | Admitting: Internal Medicine

## 2013-07-08 ENCOUNTER — Encounter (HOSPITAL_COMMUNITY): Payer: Self-pay | Admitting: Internal Medicine

## 2013-07-08 ENCOUNTER — Inpatient Hospital Stay: Admit: 2013-07-08 | Payer: Self-pay | Admitting: Internal Medicine

## 2013-07-08 ENCOUNTER — Inpatient Hospital Stay (HOSPITAL_COMMUNITY): Payer: Medicare Other

## 2013-07-08 DIAGNOSIS — R0989 Other specified symptoms and signs involving the circulatory and respiratory systems: Secondary | ICD-10-CM | POA: Diagnosis not present

## 2013-07-08 DIAGNOSIS — J189 Pneumonia, unspecified organism: Secondary | ICD-10-CM

## 2013-07-08 DIAGNOSIS — R000 Tachycardia, unspecified: Secondary | ICD-10-CM

## 2013-07-08 DIAGNOSIS — Z860100 Personal history of colon polyps, unspecified: Secondary | ICD-10-CM

## 2013-07-08 DIAGNOSIS — Z808 Family history of malignant neoplasm of other organs or systems: Secondary | ICD-10-CM

## 2013-07-08 DIAGNOSIS — N179 Acute kidney failure, unspecified: Secondary | ICD-10-CM | POA: Diagnosis present

## 2013-07-08 DIAGNOSIS — Z452 Encounter for adjustment and management of vascular access device: Secondary | ICD-10-CM | POA: Diagnosis not present

## 2013-07-08 DIAGNOSIS — D509 Iron deficiency anemia, unspecified: Secondary | ICD-10-CM | POA: Diagnosis present

## 2013-07-08 DIAGNOSIS — K5730 Diverticulosis of large intestine without perforation or abscess without bleeding: Secondary | ICD-10-CM | POA: Diagnosis present

## 2013-07-08 DIAGNOSIS — K219 Gastro-esophageal reflux disease without esophagitis: Secondary | ICD-10-CM | POA: Diagnosis present

## 2013-07-08 DIAGNOSIS — G4730 Sleep apnea, unspecified: Secondary | ICD-10-CM | POA: Diagnosis present

## 2013-07-08 DIAGNOSIS — E1129 Type 2 diabetes mellitus with other diabetic kidney complication: Secondary | ICD-10-CM | POA: Diagnosis present

## 2013-07-08 DIAGNOSIS — J4489 Other specified chronic obstructive pulmonary disease: Secondary | ICD-10-CM | POA: Diagnosis not present

## 2013-07-08 DIAGNOSIS — Z8371 Family history of colonic polyps: Secondary | ICD-10-CM

## 2013-07-08 DIAGNOSIS — E119 Type 2 diabetes mellitus without complications: Secondary | ICD-10-CM | POA: Diagnosis present

## 2013-07-08 DIAGNOSIS — E872 Acidosis, unspecified: Secondary | ICD-10-CM | POA: Diagnosis present

## 2013-07-08 DIAGNOSIS — K644 Residual hemorrhoidal skin tags: Secondary | ICD-10-CM | POA: Diagnosis present

## 2013-07-08 DIAGNOSIS — N058 Unspecified nephritic syndrome with other morphologic changes: Secondary | ICD-10-CM | POA: Diagnosis present

## 2013-07-08 DIAGNOSIS — Z7901 Long term (current) use of anticoagulants: Secondary | ICD-10-CM

## 2013-07-08 DIAGNOSIS — Z794 Long term (current) use of insulin: Secondary | ICD-10-CM

## 2013-07-08 DIAGNOSIS — J449 Chronic obstructive pulmonary disease, unspecified: Secondary | ICD-10-CM | POA: Diagnosis not present

## 2013-07-08 DIAGNOSIS — I509 Heart failure, unspecified: Secondary | ICD-10-CM | POA: Diagnosis present

## 2013-07-08 DIAGNOSIS — G5600 Carpal tunnel syndrome, unspecified upper limb: Secondary | ICD-10-CM | POA: Diagnosis present

## 2013-07-08 DIAGNOSIS — M810 Age-related osteoporosis without current pathological fracture: Secondary | ICD-10-CM | POA: Diagnosis present

## 2013-07-08 DIAGNOSIS — I10 Essential (primary) hypertension: Secondary | ICD-10-CM | POA: Diagnosis present

## 2013-07-08 DIAGNOSIS — Z6837 Body mass index (BMI) 37.0-37.9, adult: Secondary | ICD-10-CM

## 2013-07-08 DIAGNOSIS — J438 Other emphysema: Secondary | ICD-10-CM | POA: Diagnosis present

## 2013-07-08 DIAGNOSIS — J432 Centrilobular emphysema: Secondary | ICD-10-CM | POA: Diagnosis present

## 2013-07-08 DIAGNOSIS — E2749 Other adrenocortical insufficiency: Secondary | ICD-10-CM | POA: Diagnosis not present

## 2013-07-08 DIAGNOSIS — I2789 Other specified pulmonary heart diseases: Secondary | ICD-10-CM | POA: Diagnosis present

## 2013-07-08 DIAGNOSIS — E1142 Type 2 diabetes mellitus with diabetic polyneuropathy: Secondary | ICD-10-CM | POA: Diagnosis present

## 2013-07-08 DIAGNOSIS — I272 Pulmonary hypertension, unspecified: Secondary | ICD-10-CM

## 2013-07-08 DIAGNOSIS — Z8262 Family history of osteoporosis: Secondary | ICD-10-CM

## 2013-07-08 DIAGNOSIS — J9600 Acute respiratory failure, unspecified whether with hypoxia or hypercapnia: Secondary | ICD-10-CM | POA: Diagnosis not present

## 2013-07-08 DIAGNOSIS — Z96659 Presence of unspecified artificial knee joint: Secondary | ICD-10-CM

## 2013-07-08 DIAGNOSIS — I4891 Unspecified atrial fibrillation: Secondary | ICD-10-CM | POA: Diagnosis not present

## 2013-07-08 DIAGNOSIS — Z886 Allergy status to analgesic agent status: Secondary | ICD-10-CM

## 2013-07-08 DIAGNOSIS — Z833 Family history of diabetes mellitus: Secondary | ICD-10-CM

## 2013-07-08 DIAGNOSIS — M549 Dorsalgia, unspecified: Secondary | ICD-10-CM | POA: Diagnosis present

## 2013-07-08 DIAGNOSIS — R0603 Acute respiratory distress: Secondary | ICD-10-CM

## 2013-07-08 DIAGNOSIS — Z87442 Personal history of urinary calculi: Secondary | ICD-10-CM

## 2013-07-08 DIAGNOSIS — G44209 Tension-type headache, unspecified, not intractable: Secondary | ICD-10-CM | POA: Diagnosis present

## 2013-07-08 DIAGNOSIS — Z79899 Other long term (current) drug therapy: Secondary | ICD-10-CM

## 2013-07-08 DIAGNOSIS — E1149 Type 2 diabetes mellitus with other diabetic neurological complication: Secondary | ICD-10-CM | POA: Diagnosis not present

## 2013-07-08 DIAGNOSIS — J439 Emphysema, unspecified: Secondary | ICD-10-CM

## 2013-07-08 DIAGNOSIS — M1990 Unspecified osteoarthritis, unspecified site: Secondary | ICD-10-CM | POA: Diagnosis present

## 2013-07-08 DIAGNOSIS — R0902 Hypoxemia: Secondary | ICD-10-CM | POA: Diagnosis not present

## 2013-07-08 DIAGNOSIS — I959 Hypotension, unspecified: Secondary | ICD-10-CM | POA: Diagnosis not present

## 2013-07-08 DIAGNOSIS — R0609 Other forms of dyspnea: Secondary | ICD-10-CM | POA: Diagnosis not present

## 2013-07-08 DIAGNOSIS — R6520 Severe sepsis without septic shock: Secondary | ICD-10-CM | POA: Diagnosis present

## 2013-07-08 DIAGNOSIS — E785 Hyperlipidemia, unspecified: Secondary | ICD-10-CM | POA: Diagnosis present

## 2013-07-08 DIAGNOSIS — R630 Anorexia: Secondary | ICD-10-CM | POA: Diagnosis present

## 2013-07-08 DIAGNOSIS — G8929 Other chronic pain: Secondary | ICD-10-CM | POA: Diagnosis present

## 2013-07-08 DIAGNOSIS — J9620 Acute and chronic respiratory failure, unspecified whether with hypoxia or hypercapnia: Secondary | ICD-10-CM | POA: Diagnosis present

## 2013-07-08 DIAGNOSIS — R0602 Shortness of breath: Secondary | ICD-10-CM | POA: Diagnosis not present

## 2013-07-08 DIAGNOSIS — J9819 Other pulmonary collapse: Secondary | ICD-10-CM | POA: Diagnosis not present

## 2013-07-08 DIAGNOSIS — R6521 Severe sepsis with septic shock: Secondary | ICD-10-CM

## 2013-07-08 DIAGNOSIS — F341 Dysthymic disorder: Secondary | ICD-10-CM | POA: Diagnosis present

## 2013-07-08 DIAGNOSIS — Z823 Family history of stroke: Secondary | ICD-10-CM

## 2013-07-08 DIAGNOSIS — Z9981 Dependence on supplemental oxygen: Secondary | ICD-10-CM

## 2013-07-08 DIAGNOSIS — Z8249 Family history of ischemic heart disease and other diseases of the circulatory system: Secondary | ICD-10-CM

## 2013-07-08 DIAGNOSIS — A419 Sepsis, unspecified organism: Secondary | ICD-10-CM | POA: Diagnosis present

## 2013-07-08 DIAGNOSIS — E861 Hypovolemia: Secondary | ICD-10-CM | POA: Diagnosis not present

## 2013-07-08 DIAGNOSIS — Z9089 Acquired absence of other organs: Secondary | ICD-10-CM

## 2013-07-08 DIAGNOSIS — Z87891 Personal history of nicotine dependence: Secondary | ICD-10-CM

## 2013-07-08 DIAGNOSIS — I480 Paroxysmal atrial fibrillation: Secondary | ICD-10-CM | POA: Diagnosis present

## 2013-07-08 DIAGNOSIS — Z83719 Family history of colon polyps, unspecified: Secondary | ICD-10-CM

## 2013-07-08 DIAGNOSIS — R918 Other nonspecific abnormal finding of lung field: Secondary | ICD-10-CM | POA: Diagnosis not present

## 2013-07-08 HISTORY — DX: Pneumonia, unspecified organism: J18.9

## 2013-07-08 LAB — CBC
HCT: 31.9 % — ABNORMAL LOW (ref 36.0–46.0)
Hemoglobin: 9.6 g/dL — ABNORMAL LOW (ref 12.0–15.0)
MCH: 21.7 pg — ABNORMAL LOW (ref 26.0–34.0)
MCHC: 30.1 g/dL (ref 30.0–36.0)
MCV: 72.2 fL — ABNORMAL LOW (ref 78.0–100.0)
Platelets: 272 K/uL (ref 150–400)
RBC: 4.42 MIL/uL (ref 3.87–5.11)
RDW: 17.7 % — ABNORMAL HIGH (ref 11.5–15.5)
WBC: 46.8 K/uL — ABNORMAL HIGH (ref 4.0–10.5)

## 2013-07-08 LAB — GLUCOSE, CAPILLARY: Glucose-Capillary: 176 mg/dL — ABNORMAL HIGH (ref 70–99)

## 2013-07-08 LAB — COMPREHENSIVE METABOLIC PANEL
ALBUMIN: 3.3 g/dL — AB (ref 3.5–5.2)
ALT: 11 U/L (ref 0–35)
AST: 17 U/L (ref 0–37)
Alkaline Phosphatase: 74 U/L (ref 39–117)
BUN: 21 mg/dL (ref 6–23)
CALCIUM: 8.9 mg/dL (ref 8.4–10.5)
CO2: 27 mEq/L (ref 19–32)
Chloride: 93 mEq/L — ABNORMAL LOW (ref 96–112)
Creatinine, Ser: 1.01 mg/dL (ref 0.50–1.10)
GFR calc Af Amer: 65 mL/min — ABNORMAL LOW (ref >90)
GFR calc non Af Amer: 56 mL/min — ABNORMAL LOW (ref >90)
Glucose, Bld: 182 mg/dL — ABNORMAL HIGH (ref 70–99)
Potassium: 4.1 mEq/L (ref 3.7–5.3)
Sodium: 136 mEq/L — ABNORMAL LOW (ref 137–147)
Total Bilirubin: 0.7 mg/dL (ref 0.3–1.2)
Total Protein: 7.7 g/dL (ref 6.0–8.3)

## 2013-07-08 LAB — PROCALCITONIN: Procalcitonin: 2.56 ng/mL

## 2013-07-08 LAB — TROPONIN I

## 2013-07-08 LAB — PRO B NATRIURETIC PEPTIDE: Pro B Natriuretic peptide (BNP): 316 pg/mL — ABNORMAL HIGH (ref 0–125)

## 2013-07-08 LAB — MRSA PCR SCREENING: MRSA by PCR: NEGATIVE

## 2013-07-08 MED ORDER — OSELTAMIVIR PHOSPHATE 75 MG PO CAPS
75.0000 mg | ORAL_CAPSULE | Freq: Two times a day (BID) | ORAL | Status: DC
  Administered 2013-07-08 – 2013-07-09 (×4): 75 mg via ORAL
  Filled 2013-07-08 (×7): qty 1

## 2013-07-08 MED ORDER — INSULIN ASPART 100 UNIT/ML ~~LOC~~ SOLN
4.0000 [IU] | Freq: Three times a day (TID) | SUBCUTANEOUS | Status: DC
  Administered 2013-07-08 – 2013-07-12 (×11): 4 [IU] via SUBCUTANEOUS

## 2013-07-08 MED ORDER — LEVALBUTEROL HCL 0.63 MG/3ML IN NEBU
0.6300 mg | INHALATION_SOLUTION | Freq: Four times a day (QID) | RESPIRATORY_TRACT | Status: DC
  Administered 2013-07-08: 0.63 mg via RESPIRATORY_TRACT

## 2013-07-08 MED ORDER — LEVALBUTEROL HCL 0.63 MG/3ML IN NEBU
0.6300 mg | INHALATION_SOLUTION | Freq: Three times a day (TID) | RESPIRATORY_TRACT | Status: DC
  Administered 2013-07-09 – 2013-07-13 (×12): 0.63 mg via RESPIRATORY_TRACT
  Filled 2013-07-08 (×26): qty 3

## 2013-07-08 MED ORDER — INSULIN GLARGINE 100 UNIT/ML ~~LOC~~ SOLN
50.0000 [IU] | Freq: Every day | SUBCUTANEOUS | Status: DC
  Administered 2013-07-08 – 2013-07-13 (×6): 50 [IU] via SUBCUTANEOUS
  Filled 2013-07-08 (×6): qty 0.5

## 2013-07-08 MED ORDER — ONE-DAILY MULTI VITAMINS PO TABS: 1.0000 | ORAL_TABLET | Freq: Every day | ORAL | Status: DC

## 2013-07-08 MED ORDER — DULOXETINE HCL 60 MG PO CPEP
60.0000 mg | ORAL_CAPSULE | Freq: Every morning | ORAL | Status: DC
  Administered 2013-07-09 – 2013-07-13 (×5): 60 mg via ORAL
  Filled 2013-07-08 (×5): qty 1

## 2013-07-08 MED ORDER — ONDANSETRON HCL 4 MG/2ML IJ SOLN: 4.0000 mg | Freq: Four times a day (QID) | INTRAMUSCULAR | Status: DC | PRN

## 2013-07-08 MED ORDER — RIVAROXABAN 20 MG PO TABS
20.0000 mg | ORAL_TABLET | Freq: Every day | ORAL | Status: DC
  Administered 2013-07-08 – 2013-07-12 (×5): 20 mg via ORAL
  Filled 2013-07-08 (×6): qty 1

## 2013-07-08 MED ORDER — DEXTROSE 5 % IV SOLN
1.0000 g | INTRAVENOUS | Status: DC
  Administered 2013-07-08 – 2013-07-12 (×5): 1 g via INTRAVENOUS
  Filled 2013-07-08 (×6): qty 10

## 2013-07-08 MED ORDER — GABAPENTIN 600 MG PO TABS
600.0000 mg | ORAL_TABLET | Freq: Three times a day (TID) | ORAL | Status: DC
  Administered 2013-07-08 – 2013-07-12 (×14): 600 mg via ORAL
  Filled 2013-07-08 (×27): qty 1

## 2013-07-08 MED ORDER — ATORVASTATIN CALCIUM 40 MG PO TABS
40.0000 mg | ORAL_TABLET | Freq: Every day | ORAL | Status: DC
  Administered 2013-07-08 – 2013-07-12 (×5): 40 mg via ORAL
  Filled 2013-07-08 (×6): qty 1

## 2013-07-08 MED ORDER — IPRATROPIUM BROMIDE 0.02 % IN SOLN
RESPIRATORY_TRACT | Status: AC
  Filled 2013-07-08: qty 2.5

## 2013-07-08 MED ORDER — POLYETHYLENE GLYCOL 3350 17 G PO PACK
17.0000 g | PACK | Freq: Two times a day (BID) | ORAL | Status: DC
  Administered 2013-07-08: 17 g via ORAL
  Filled 2013-07-08 (×11): qty 1

## 2013-07-08 MED ORDER — POLYSACCHARIDE IRON COMPLEX 150 MG PO CAPS
150.0000 mg | ORAL_CAPSULE | Freq: Every day | ORAL | Status: DC
  Administered 2013-07-08 – 2013-07-13 (×6): 150 mg via ORAL
  Filled 2013-07-08 (×8): qty 1

## 2013-07-08 MED ORDER — ONDANSETRON HCL 4 MG PO TABS: 4.0000 mg | ORAL_TABLET | Freq: Four times a day (QID) | ORAL | Status: DC | PRN

## 2013-07-08 MED ORDER — IPRATROPIUM BROMIDE 0.02 % IN SOLN
0.5000 mg | Freq: Four times a day (QID) | RESPIRATORY_TRACT | Status: DC
  Administered 2013-07-08: 0.5 mg via RESPIRATORY_TRACT

## 2013-07-08 MED ORDER — DOCUSATE SODIUM 100 MG PO CAPS
100.0000 mg | ORAL_CAPSULE | Freq: Two times a day (BID) | ORAL | Status: DC
  Administered 2013-07-08 – 2013-07-13 (×8): 100 mg via ORAL
  Filled 2013-07-08 (×11): qty 1

## 2013-07-08 MED ORDER — IPRATROPIUM BROMIDE 0.02 % IN SOLN
0.5000 mg | Freq: Three times a day (TID) | RESPIRATORY_TRACT | Status: DC
  Administered 2013-07-09 – 2013-07-13 (×12): 0.5 mg via RESPIRATORY_TRACT
  Filled 2013-07-08 (×13): qty 2.5

## 2013-07-08 MED ORDER — ADULT MULTIVITAMIN W/MINERALS CH
1.0000 | ORAL_TABLET | Freq: Every day | ORAL | Status: DC
  Administered 2013-07-08 – 2013-07-13 (×6): 1 via ORAL
  Filled 2013-07-08 (×6): qty 1

## 2013-07-08 MED ORDER — SODIUM CHLORIDE 0.9 % IV BOLUS (SEPSIS)
750.0000 mL | Freq: Once | INTRAVENOUS | Status: AC
  Administered 2013-07-08: 750 mL via INTRAVENOUS

## 2013-07-08 MED ORDER — ACETAMINOPHEN 325 MG PO TABS
650.0000 mg | ORAL_TABLET | Freq: Four times a day (QID) | ORAL | Status: DC | PRN
  Administered 2013-07-10 – 2013-07-12 (×5): 650 mg via ORAL
  Filled 2013-07-08 (×5): qty 2

## 2013-07-08 MED ORDER — GUAIFENESIN-DM 100-10 MG/5ML PO SYRP
5.0000 mL | ORAL_SOLUTION | ORAL | Status: DC | PRN
  Administered 2013-07-11 – 2013-07-13 (×3): 5 mL via ORAL
  Filled 2013-07-08 (×3): qty 5

## 2013-07-08 MED ORDER — ALPRAZOLAM 0.25 MG PO TABS
0.5000 mg | ORAL_TABLET | Freq: Every evening | ORAL | Status: DC | PRN
  Administered 2013-07-11 – 2013-07-12 (×2): 0.5 mg via ORAL
  Filled 2013-07-08 (×2): qty 2

## 2013-07-08 MED ORDER — SODIUM CHLORIDE 0.9 % IV BOLUS (SEPSIS)
1000.0000 mL | Freq: Once | INTRAVENOUS | Status: AC
  Administered 2013-07-08: 1000 mL via INTRAVENOUS

## 2013-07-08 MED ORDER — PANTOPRAZOLE SODIUM 40 MG PO TBEC
40.0000 mg | DELAYED_RELEASE_TABLET | Freq: Every day | ORAL | Status: DC
  Administered 2013-07-08 – 2013-07-13 (×6): 40 mg via ORAL
  Filled 2013-07-08 (×5): qty 1

## 2013-07-08 MED ORDER — ROFLUMILAST 500 MCG PO TABS
500.0000 ug | ORAL_TABLET | Freq: Every day | ORAL | Status: DC
  Administered 2013-07-08 – 2013-07-13 (×6): 500 ug via ORAL
  Filled 2013-07-08 (×6): qty 1

## 2013-07-08 MED ORDER — LEVALBUTEROL HCL 0.63 MG/3ML IN NEBU
0.6300 mg | INHALATION_SOLUTION | Freq: Four times a day (QID) | RESPIRATORY_TRACT | Status: DC | PRN
  Administered 2013-07-11: 0.63 mg via RESPIRATORY_TRACT
  Filled 2013-07-08: qty 3

## 2013-07-08 MED ORDER — HYDROCODONE-ACETAMINOPHEN 5-325 MG PO TABS
1.0000 | ORAL_TABLET | Freq: Four times a day (QID) | ORAL | Status: DC | PRN
  Administered 2013-07-08 – 2013-07-13 (×9): 1 via ORAL
  Filled 2013-07-08 (×9): qty 1

## 2013-07-08 MED ORDER — DEXTROSE 5 % IV SOLN
500.0000 mg | INTRAVENOUS | Status: DC
  Administered 2013-07-08 – 2013-07-10 (×3): 500 mg via INTRAVENOUS
  Filled 2013-07-08 (×4): qty 500

## 2013-07-08 MED ORDER — OMEGA-3-ACID ETHYL ESTERS 1 G PO CAPS
1.0000 | ORAL_CAPSULE | Freq: Three times a day (TID) | ORAL | Status: DC
  Administered 2013-07-08 – 2013-07-13 (×15): 1 g via ORAL
  Filled 2013-07-08 (×21): qty 1

## 2013-07-08 MED ORDER — INSULIN ASPART 100 UNIT/ML ~~LOC~~ SOLN
0.0000 [IU] | Freq: Three times a day (TID) | SUBCUTANEOUS | Status: DC
  Administered 2013-07-08 – 2013-07-09 (×2): 3 [IU] via SUBCUTANEOUS
  Administered 2013-07-09 – 2013-07-10 (×3): 2 [IU] via SUBCUTANEOUS
  Administered 2013-07-10 (×2): 3 [IU] via SUBCUTANEOUS
  Administered 2013-07-11 – 2013-07-12 (×3): 2 [IU] via SUBCUTANEOUS
  Administered 2013-07-12: 3 [IU] via SUBCUTANEOUS
  Administered 2013-07-12 – 2013-07-13 (×2): 2 [IU] via SUBCUTANEOUS

## 2013-07-08 MED ORDER — SODIUM CHLORIDE 0.9 % IV SOLN
INTRAVENOUS | Status: DC
  Administered 2013-07-08: via INTRAVENOUS

## 2013-07-08 MED ORDER — LACTATED RINGERS IV BOLUS (SEPSIS)
1000.0000 mL | Freq: Once | INTRAVENOUS | Status: AC
  Administered 2013-07-09: 1000 mL via INTRAVENOUS

## 2013-07-08 MED ORDER — ACETAMINOPHEN 650 MG RE SUPP: 650.0000 mg | Freq: Four times a day (QID) | RECTAL | Status: DC | PRN

## 2013-07-08 MED ORDER — CYCLOBENZAPRINE HCL 10 MG PO TABS
10.0000 mg | ORAL_TABLET | Freq: Every day | ORAL | Status: DC
  Administered 2013-07-09 – 2013-07-12 (×4): 10 mg via ORAL
  Filled 2013-07-08 (×7): qty 1

## 2013-07-08 MED ORDER — METOPROLOL TARTRATE 1 MG/ML IV SOLN: 2.5000 mg | INTRAVENOUS | Status: DC | PRN

## 2013-07-08 MED ORDER — POLYETHYLENE GLYCOL 3350 17 GM/SCOOP PO POWD
17.0000 g | Freq: Two times a day (BID) | ORAL | Status: DC
  Filled 2013-07-08: qty 255

## 2013-07-08 MED ORDER — BUDESONIDE-FORMOTEROL FUMARATE 160-4.5 MCG/ACT IN AERO
2.0000 | INHALATION_SPRAY | Freq: Two times a day (BID) | RESPIRATORY_TRACT | Status: DC
  Administered 2013-07-08 – 2013-07-13 (×10): 2 via RESPIRATORY_TRACT
  Filled 2013-07-08: qty 6

## 2013-07-08 MED ORDER — METOPROLOL TARTRATE 1 MG/ML IV SOLN
2.5000 mg | INTRAVENOUS | Status: DC | PRN
  Administered 2013-07-10: 2.5 mg via INTRAVENOUS
  Filled 2013-07-08: qty 5

## 2013-07-08 MED ORDER — SODIUM CHLORIDE 0.9 % IJ SOLN
3.0000 mL | Freq: Two times a day (BID) | INTRAMUSCULAR | Status: DC
  Administered 2013-07-08 – 2013-07-12 (×8): 3 mL via INTRAVENOUS

## 2013-07-08 NOTE — ED Notes (Signed)
All pt belongings sent to [facility] with pt and pt's husband

## 2013-07-08 NOTE — ED Notes (Signed)
MD changed pt's bed status to ICU vs step down. Called 2C to notify RN that pt will not be coming. Bed is being requested for ICU.

## 2013-07-08 NOTE — ED Notes (Signed)
Blood cultures drawn prior to IV antibiotics given 

## 2013-07-08 NOTE — ED Provider Notes (Signed)
MSE was initiated and I personally evaluated the patient and placed orders (if any) at  2:17 PM on July 08, 2013.  The patient appears stable so that the remainder of the MSE may be completed by another provider.patient appears to be in respiratory, distress.  We'll speak with Dr. Brigitte Pulse she is currently stable otherwise, IVs are established.  Brent General, PA-C 07/08/13 1439

## 2013-07-08 NOTE — ED Provider Notes (Signed)
Medical screening examination/treatment/procedure(s) were conducted as a shared visit with non-physician practitioner(s) and myself.  I personally evaluated the patient during the encounter.  EKG Interpretation    Date/Time:  Friday July 08 2013 13:59:54 EST Ventricular Rate:  127 PR Interval:  149 QRS Duration: 103 QT Interval:  321 QTC Calculation: 467 R Axis:   84 Text Interpretation:  Sinus tachycardia Low voltage, precordial leads Consider right ventricular hypertrophy Similar to prior Confirmed by Mingo Amber  MD, [place] ([id number]) on 07/08/2013 2:09:56 PM             Patient here for PNA admission by Dr. Brigitte Pulse. Tachycardic, hypotensive. Is getting admitted to stepdown. Patient here tachypneic, tachycardic, hypotensive (90s/40s). I spoke with Dr. Brigitte Pulse for concerns of patient needed ICU care. He asked that I speak with critical care, who will consult and possibly admit.   Osvaldo Shipper, MD 07/08/13 [phone]

## 2013-07-08 NOTE — Consult Note (Addendum)
Name: Destiny Shelton MRN: [id number] DOB: 10-May-1946    ADMISSION DATE:  07/08/2013 CONSULTATION DATE:  2/06  REFERRING MD :  EDP PRIMARY SERVICE: Lang Snow, MD   BRIEF PATIENT DESCRIPTION:  48 Shelton with O2 dependent COPD (followed by [facility]) admitted with dx of CAP,hypotension (presumed septic), marked leukocytosis, AKI. Admitted out of office of Dr Lang Snow. EDP requested PCCM consult due to hypotension  SIGNIFICANT EVENTS / STUDIES:    LINES / TUBES:   CULTURES:  RVP nasal swab 2/06 >>  Blood 2/06 >>   ANTIBIOTICS: Oseltamivir 2/06 >>  Azithro 2/06 >>  Ceftriaxone 2/06 >>   HISTORY OF PRESENT ILLNESS:   Destiny Shelton with baseline O2 dependent COPD and severe baseline limitation due to DOE and weakness presented to primary care MD's office with a couple of days of subjective fevers, infrascapular pain, progressive dyspnea and severe malaise. She has had cough with white mucus production. No hemoptysis. In Dr Raul Del office, a CXR was obtained and reportedly showed RLL infiltrate. She was sent to [facility] ED where she was noted to be hypotensive. Therefore PCCM was asked to eval as consultant for possible severe sepsis and septic shock  PAST MEDICAL HISTORY :  Past Medical History  Diagnosis Date  . ALLERGIC RHINITIS   . Hypertension   . Adenomatous colon polyp   . Gastritis   . Diverticulosis of colon   . Diabetes mellitus type 2 with neurological manifestations     On Insulin  . Depression   . Osteoporosis   . Morbidly obese   . Back pain, chronic   . Anxiety   . Hyperlipemia   . COPD (chronic obstructive pulmonary disease)     Requiring Home O2 at 4L  . GERD (gastroesophageal reflux disease)   . Urosepsis 05/26/2012  . Atrial tachycardia     Mostly Sinus Tachycardia  . PAF (paroxysmal atrial fibrillation)   . Osteoarthritis   . Sleep apnea     no cpcp machine 02 at 4l/min  . External hemorrhoids   . Carotid stenosis   . Nephrolithiasis   . Headache(784.0)     tension    . Neuromuscular disorder     diabetic neuropathy  . Fibromyalgia     "pain in arms and shoulders."  . Anemia     iron deficient  . Inappropriate sinus tachycardia   . 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   Past Surgical History  Procedure Laterality Date  . Appendectomy  1963  . Cholecystectomy  1963  . Abdominal hysterectomy  1978  . Total knee arthroplasty  1997    right  . Upper gastrointestinal endoscopy  04/09/2006    w/Dilation, gastritis  . Colonoscopy w/ biopsies and polypectomy  07/22/2007, 04/28/2007    2009: diverticulosis, 2008: diverticulosis, adenomatous polyps  . Tee without cardioversion  03/24/2012    Procedure: TRANSESOPHAGEAL ECHOCARDIOGRAM (TEE);  Surgeon: Pixie Casino, MD;  Location: [facility] OR;  Service: Cardiovascular;  Laterality: N/A;  TEE with Bubble  . Esophagogastroduodenoscopy (egd) with propofol N/A 10/01/2012    Procedure: ESOPHAGOGASTRODUODENOSCOPY (EGD) WITH PROPOFOL;  Surgeon: Gatha Mayer, MD;  Location: [facility] ENDOSCOPY;  Service: Endoscopy;  Laterality: N/A;  . Colonoscopy with propofol N/A 10/01/2012    Procedure: COLONOSCOPY WITH PROPOFOL;  Surgeon: Gatha Mayer, MD;  Location: [facility] ENDOSCOPY;  Service: Endoscopy;  Laterality: N/A;  . Colonoscopy N/A 12/21/2012    Procedure: COLONOSCOPY;  Surgeon: Gatha Mayer, MD;  Location: [facility] ENDOSCOPY;  Service: Endoscopy;  Laterality: N/A;  . Hot hemostasis N/A 12/21/2012    Procedure: HOT HEMOSTASIS (ARGON PLASMA COAGULATION/BICAP);  Surgeon: Gatha Mayer, MD;  Location: [facility];  Service: Endoscopy;  Laterality: N/A;  . Right and left cardiac catheterization  July 2013    RAP: 22 mmHg, and RVP: 46/16 mmHg, RV EDP 18; L. BP 110/11 mmHg, LVEDP 20 mmHg;  PAP 45/29 mmHg (mean 37 mmHg); PCWP 29/22 mmHg - 26 mmHg; no significant coronary disease   Prior to Admission medications   Medication Sig Start Date End Date Taking? Authorizing Provider  acetaminophen (TYLENOL) 500 MG tablet Take 1,000 mg by mouth every 6 (six)  hours as needed for mild pain.   Yes Historical Provider, MD  ALPRAZolam Duanne Moron) 0.5 MG tablet Take 0.5-1 mg by mouth at bedtime as needed. For anxiety   Yes Historical Provider, MD  AMBULATORY NON FORMULARY MEDICATION Continuous O2 @@ 4LMP   Yes Historical Provider, MD  benzonatate (TESSALON) 200 MG capsule Take 400 mg by mouth at bedtime. For cough   Yes Historical Provider, MD  budesonide-formoterol (SYMBICORT) 160-4.5 MCG/ACT inhaler Inhale 2 puffs into the lungs 2 (two) times daily as needed (shortness of breath).   Yes Historical Provider, MD  CALCIUM-VITAMIN D PO Take 1 tablet by mouth 2 (two) times daily.    Yes Historical Provider, MD  cyclobenzaprine (FLEXERIL) 10 MG tablet Take 10 mg by mouth at bedtime.    Yes Historical Provider, MD  docusate sodium (COLACE) 100 MG capsule Take 100 mg by mouth 2 (two) times daily.    Yes Historical Provider, MD  DULoxetine (CYMBALTA) 60 MG capsule Take 60 mg by mouth every morning.    Yes Historical Provider, MD  furosemide (LASIX) 20 MG tablet Take 40 mg by mouth 2 (two) times daily.   Yes Historical Provider, MD  gabapentin (NEURONTIN) 600 MG tablet Take 600 mg by mouth 3 (three) times daily.    Yes Historical Provider, MD  HYDROcodone-acetaminophen (NORCO/VICODIN) 5-325 MG per tablet Take 1 tablet by mouth every 8 (eight) hours as needed for pain.   Yes Historical Provider, MD  insulin aspart (NOVOLOG) 100 UNIT/ML injection Inject 10 Units into the skin 2 (two) times daily.    Yes Historical Provider, MD  insulin glargine (LANTUS) 100 UNIT/ML injection Inject 70 Units into the skin daily before breakfast. 06/01/12  Yes W Lutricia Feil, MD  iron polysaccharides (NIFEREX) 150 MG capsule Take 1 capsule (150 mg total) by mouth daily. 06/01/12  Yes Janalyn Rouse, MD  levalbuterol [facility] HFA) 45 MCG/ACT inhaler Inhale 1-2 puffs into the lungs every 4 (four) hours as needed for wheezing. 06/01/12  Yes Janalyn Rouse, MD  Multiple Vitamin (MULTIVITAMIN)  tablet Take 1 tablet by mouth daily.     Yes Historical Provider, MD  Omega-3 Fatty Acids (FISH OIL) 1000 MG CAPS Take 1 capsule by mouth 3 (three) times daily.    Yes Historical Provider, MD  omeprazole (PRILOSEC) 20 MG capsule Take 20 mg by mouth at bedtime.    Yes Historical Provider, MD  Polyethyl Glycol-Propyl Glycol (SYSTANE OP) Place 1 drop into both eyes every morning.   Yes Historical Provider, MD  polyethylene glycol (MIRALAX / GLYCOLAX) packet Take 17 g by mouth daily as needed for mild constipation.   Yes Historical Provider, MD  potassium chloride (KLOR-CON) 10 MEQ CR tablet Take 10 mEq by mouth 3 (three) times daily.    Yes Historical Provider, MD  pravastatin (PRAVACHOL) 40 MG tablet Take  40 mg by mouth daily.   Yes Historical Provider, MD  Rivaroxaban (XARELTO) 20 MG TABS Take 1 tablet (20 mg total) by mouth daily with supper. Restart on Friday 12/24/2012 12/21/12  Yes Gatha Mayer, MD  roflumilast (DALIRESP) 500 MCG TABS tablet Take 500 mcg by mouth daily.    Yes Historical Provider, MD  tiotropium (SPIRIVA) 18 MCG inhalation capsule Place 18 mcg into inhaler and inhale daily.   Yes Historical Provider, MD  valsartan (DIOVAN) 160 MG tablet Take 80 mg by mouth daily.   Yes Historical Provider, MD  verapamil (VERELAN PM) 360 MG 24 hr capsule Take 1 capsule (360 mg total) by mouth daily. 11/30/12  Yes Leonie Man, MD  atorvastatin (LIPITOR) 40 MG tablet Take 1 tablet by mouth daily. 06/06/13   Historical Provider, MD  irbesartan (AVAPRO) 150 MG tablet Take 1 tablet (150 mg total) by mouth daily. 06/10/13   Leonie Man, MD   Allergies  Allergen Reactions  . Aspirin Shortness Of Breath  . Nsaids Shortness Of Breath    FAMILY HISTORY:  Family History  Problem Relation Age of Onset  . Heart disease Mother   . Stroke Mother   . Heart disease Father   . Heart disease Brother   . Stroke Brother   . Skin cancer Brother   . Colon cancer Neg Hx   . Colon polyps Sister   .  Diabetes Sister   . Diabetes Brother   . Irritable bowel syndrome Daughter    SOCIAL HISTORY:  reports that she quit smoking about 19 years ago. Her smoking use included Cigarettes. She has a 60 pack-year smoking history. She has never used smokeless tobacco. She reports that she does not drink alcohol or use illicit drugs.  REVIEW OF SYSTEMS: + anorexia, no vomiting, diarrhea, dysuria. No LE edema or calf tenderness  SUBJECTIVE:   VITAL SIGNS: Temp:  [98.9 Shelton (37.2 C)-100.1 Shelton (37.8 C)] 98.9 Shelton (37.2 C) (02/06 1501) Pulse Rate:  [124-127] 124 (02/06 1515) Resp:  [21-26] 22 (02/06 1551) BP: (92-100)/(16-41) 100/32 mmHg (02/06 1551) SpO2:  [89 %-95 %] 93 % (02/06 1551) HEMODYNAMICS:   VENTILATOR SETTINGS:   INTAKE / OUTPUT: Intake/Output   None     PHYSICAL EXAMINATION: General: Appears acutely ill and mildly dyspneic @ rest Neuro:  CNs intact, motor and sensory intact, generally weak without focal deficits HEENT: NCAT, WNL Cardiovascular:  Tachy, regular, no M noted Lungs: distant BS throughout, R basilar crackles, no wheezes, No bronchial BS Abdomen:  Obese, soft, +BS, NT Ext: warm without edema  LABS:  CBC  Recent Labs Lab 07/08/13 1425  WBC 46.8*  HGB 9.6*  HCT 31.9*  PLT 272   Coag's No results found for this basename: APTT, INR,  in the last 168 hours BMET  Recent Labs Lab 07/08/13 1425  NA 136*  K 4.1  CL 93*  CO2 27  BUN 21  CREATININE 1.01  GLUCOSE 182*   Electrolytes  Recent Labs Lab 07/08/13 1425  CALCIUM 8.9   Sepsis Markers No results found for this basename: LATICACIDVEN, PROCALCITON, O2SATVEN,  in the last 168 hours ABG No results found for this basename: PHART, PCO2ART, PO2ART,  in the last 168 hours Liver Enzymes  Recent Labs Lab 07/08/13 1425  AST 17  ALT 11  ALKPHOS 74  BILITOT 0.7  ALBUMIN 3.3*   Cardiac Enzymes  Recent Labs Lab 07/08/13 1425  TROPONINI <0.30  PROBNP 316.0*   Glucose No  results found  for this basename: GLUCAP,  in the last 168 hours    CXR: Bibasilar infiltrates and/or atx  ASSESSMENT / PLAN:  PULMONARY A: Acute on chronic resp failure Severe O2 dependent COPD Pulmonary hypertension - on roflumilast chronically CAP, NOS P:   Supplemental O2 Scheduled nebs Cont roflumilast Monitor closely in ICU for first 24 hrs or so Follow CXR - repeat ordered for 2/07 AM  CARDIOVASCULAR A: Hypotension/shock - likely septic Sinus tachycardia Paroxysmal AF (Cardiologist: Ellyn Hack) Check cortisol P:  Volume resuscitate If BP not improved with IVFs, will need CVL and vasopressors Telemetry monitoring Consider stress dose steroids if cortisol low Holding verapamil and ARB Low dose PRN metoprolol to maintain HR < 120/min  RENAL A:  Acute renal insuff (baseline Cr 0.Destiny) P:   Monitor BMET intermittently Monitor I/Os Correct electrolytes as indicated   GASTROINTESTINAL A:  Anorexia Chronic PPI use P:   SUP: PO pantoprazole Clear liquids diet. Advance as tolerated  HEMATOLOGIC A:  Microcytic anemia (chronic) without overt blood loss Chronic anticoagulation (for PAF and PAH) P:  DVT px: full anticoagulation with Xarelto Monitor CBC intermittently Transfuse per ICU guidelines  INFECTIOUS A:  CAP, NOS Doubt influenza based on CXR pattern Severe leukocytosis P:   Micro and abx as above PCT algorithm Would DC oseltamivir if influenza neg on RVP  ENDOCRINE A:  DM 2 P:   SSI q 4 hrs Change to ACHS once diet advanced  NEUROLOGIC A: Hx of depression P:   Cont home meds Delirium prevention measures  TODAY'S SUMMARY:   I have personally obtained a history, examined the patient, evaluated laboratory and imaging results, formulated the assessment and plan and placed orders. CRITICAL CARE: The patient is critically ill with multiple organ systems failure and requires high complexity decision making for assessment and support, frequent evaluation and  titration of therapies, application of advanced monitoring technologies and extensive interpretation of multiple databases. Critical Care Time devoted to patient care services described in this note is 50 minutes.   Pt and husband updated   Merton Border, MD ; [facility] [phone].  After 5:30 PM or weekends, call [phone]  07/08/2013, 3:57 PM

## 2013-07-08 NOTE — Significant Event (Signed)
Pt with low MAP.  Will give additional fluid bolus and monitor.  Chesley Mires, MD 07/08/2013, 5:28 PM

## 2013-07-08 NOTE — ED Notes (Signed)
Pt at MD office today for productive cough- yellow/green sputum, SOB, and progressive weakness (since Wed.). Xray showed right sided infiltrates, flu swab Neg. Pt wears 4L O2 [place] at home and sats 95% on 4L now. 107/50, HR ST 128. Pt also complaining of pain in right arm/under right arm.

## 2013-07-08 NOTE — Progress Notes (Signed)
MD notified that pt's MAP 40's-50's SBP 80-90, HR 120's, Resp 30's and on venti mask. Orders were given for a bolus. See [facility]

## 2013-07-08 NOTE — H&P (Signed)
Height: 65.50 in., ( 166.37 cm) Temperature: 99.2 deg F, Temperature site: oral Pulse rate: 125  Blood Pressure #1: 82 / 40 mm Hg       Pulse Oximetry  O2 Saturation: 80  % Comments: pt is on 4 liters of 02  Other Procedures Completed: CXR       History of Present Illness  History from: patient Reason for visit: See chief complaint Chief Complaint: pt said that last night she started to have dyspnea more, fever, chills History of Present Illness: Destiny Shelton is 68 year old white female with a history of oxygen dependent COPD, A. fib with RVR, and pulmonary hypertension.  He presented to the office today with a complaint of increased shortness of breath, and muscle aches.  She states that she was in her usual state of health (chronically poor but recently stable) until she developed leg stiffness about 2 days ago.  Last night developed chills, increased myalgias (entire body), fever (no thermometer), worsening shortness of breath and cough.  Thick yellow sputum.  Minimal chest pain, some upper back pain and left shoulder pain.   On O2 4 liters at baseline.  Compliant with inhalers.  Has taken 3 Lasix twice a day for the past month.  In the office her oxygen saturation saturations are 80%.  Chest x-ray shows probable right lower lobe infiltrate.  Influenza swab is negative.  She is being admitted for further management.   Review of Systems  General:       Complains of fevers, chills.   Ears/Nose/Throat:       Denies nasal congestion.   Cardiovascular:       Complains of see HPI.   Respiratory:       Complains of see HPI.   Gastrointestinal:       Denies diarrhea.   Genitourinary:       Denies urinary frequency.   Musculoskeletal:       Complains of see HPI.   Skin:       Denies rash.   Heme/Lymphatic:       Denies bleeding.    Past History Past Medical History (reviewed - no changes required): DM2, insulin dependant with neuropathy,  nephropathy HTN Hyperlipidemia COPD on home O2- followed by Dr. Gwenette Greet Pulmonary hypertension (RV systolic pressure of 45 mmHg) Afib with RVR-->heart failure, acute respiratory failure (12/13) ANXIETY DEPRESSION  Dx of CARCINOMA, SKIN, SQUAMOUS CELL, FACE --left 6/05  CAROTID STENOSIS--< 50% (B) (5/10) CARPAL TUNNEL SYNDROME, BILATERAL --S/P repair (?(R))  DIVERTICULAR DISEASE  GERD-S/P esophageal dilation (11/07)  NEPHROLITHIASIS  OSTEOPOROSIS  POLYP, COLON, RECURRENT--11/07, 11/08, 8/09, 7/14 Surgical History (reviewed - no changes required): RIGHT TKR (1997) CHOLE PARTIAL HYSTERECTOMY APPY BLADDER TACK Family History (reviewed - no changes required): Father: nephrolithiasis, valve disease, PNA, died 35 Mother: died 39: MI, DM2, HTN 4 brothers: CVA, DM2, heart disease, died 66; DM2 X 2;  1 sis: DM2, osteoporosis 2 daughters-healthy Social History (reviewed - no changes required): married  (Amado Coe' mother-in-law) 2 daughters/ 6 GC/ 2 GGC education: high school 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 tobacco: 2ppd x 30years; quit 1992 alcohol: denies drugs: denies  Family History Summary:     Reviewed history Last on 06/06/2013 and no changes required:07/08/2013 Mother Ailene Ravel.) - Has Family History of Diabetes - Entered On: 01/12/2013  General Comments - FH: Father: nephrolithiasis, valve disease, PNA, died 10 Mother: died 71: MI, DM2, HTN 4 brothers: CVA, DM2, heart disease, died 70; DM2 X 2;  1 sis:  DM2, osteoporosis 2 daughters-healthy  Social History:    Reviewed history from 06/06/2013 and no changes required:       married  (Amado Coe' mother-in-law)       2 daughters/ 6 GC/ 2 GGC       education: high school       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       tobacco: 2ppd x 30years; quit 1992       alcohol: denies       drugs: denies   Physical Exam  General appearance: acutely ill, cachectic at rest,  mildly lethargic  Eyes  External: conjunctivae and lids normal Pupils: equal, round, reactive to light and accommodation  Ears, Nose and Throat  External ears: normal, no lesions or deformities External nose: normal, no lesions or deformities Otoscopic: canals clear, tympanic membranes intact, no fluid Hearing: grossly intact Nasal: mucosa, septum, and turbinates normal Dental: poor dentition Pharynx: dry, no erythema or exudate  Neck  Neck: supple, no masses, trachea midline Thyroid: no nodules, masses, tenderness, or enlargement  Respiratory  Respiratory effort: no intercostal retractions or use of accessory muscles; wearing O2 Auscultation: minimal bibasilar crackles  Cardiovascular  Auscultation: tachycardic without murmurs, rubs or gallops Pedal pulses: pulses 2+, symmetric Periph. circulation: no cyanosis, clubbing, edema  Gastrointestinal  Abdomen: soft, non-tender, no masses, bowel sounds normal-obese Liver and spleen: no enlargement or nodularity  Lymphatic  Neck: no cervical adenopathy  Musculoskeletal  Gait and station: cane Digits and nails:  cyanosis, petechiae, or nodes; clubbing is present Head and neck: normal alignment and mobility Spine, ribs, pelvis: normal alignment and mobility, no deformity RUE: normal ROM and strength, no joint enlargement or tenderness LUE: normal ROM and strength, no joint enlargement or tenderness RLE: normal ROM and strength, no joint enlargement or tenderness LLE: normal ROM and strength, no joint enlargement or tenderness  Skin  Inspection: no rashes, lesions  Mental Status Exam  Judgment, insight: intact Orientation: oriented to time, place, and person Memory: intact Mood and affect: Normal Mood   Impression & Recommendations:  Problem # 1:  Pneumonia, right lower lobe (ICD-486) (ICD10-J18.9) Chest x-ray reviewed and shows right lower lobe infiltrate.  Influenza swab is negative, but may be a false negative given  local outbreak of influenza and multiple flulike symptoms.  We'll obtain nasal PCR in treat empirically with Tamiflu and with Rocephin/azithromycin to cover community-acquired pneumonia.  Continue O2 to keep sats above 90%. Orders: Flu Screen ([id number])   Problem # 2:  Hypoxia (ICD-799.02) (ICD10-R09.01) Worsening hypoxia secondary to pneumonia superimposed on her COPD.  We'll treat with scheduled bronchodilators and oxygen.  Problem # 3:  Congestive heart failure (ICD-428.0) (ICD10-I50.9) EF 55-60% with prior heart failure secondary to A. fib.  She has done best with diuresis, but appears to be volume depleted currently.  Obtain labs. Her updated medication list for this problem includes:    Losartan Potassium 100 Mg Oral Tabs (Losartan potassium) ..... One po every day    Diovan 160 Mg Tabs (Valsartan) .Marland Kitchen... 1/2 tab po daily    Furosemide 40 Mg Tabs (Furosemide) .Marland KitchenMarland KitchenMarland KitchenMarland Kitchen 3 pills twice a day   Problem # 4:  COPD (ICD-496) (ICD10-J44.9) Scheduled nebulizers.  We'll hold off on steroids as she is not wheezing currently and most of her symptoms are likely infectious.  Problem # 5:  PULMONARY HYPERTENSION (ICD-416.8) Prior echocardiogram in 10/13 showed a possible shunt.  May want to reevaluate if having difficulty maintaining her oxygen levels.  Defer to pulmonology/cardiology.  Problem # 6:  Atrial fibrillation with rapid ventricular response (ICD-427.31) (ICD10-I48.91) Tachycardic secondary to underlying lung issues, but appears to be sinus tachycardia on pulse ox.  Obtain EKG.  Medications Added to Medication List This Visit: 1)  Furosemide 40 Mg Tabs (Furosemide) .... 3 pills twice a day  Other Orders: Chest X-Ray (Pa & L) ([id number]) Pulse Ox ([id number])  Complete Medication List: 1)  Losartan Potassium 100 Mg Oral Tabs (Losartan potassium) .... One po every day 2)  Atorvastatin Calcium 40 Mg Tabs (Atorvastatin calcium) .Marland Kitchen.. 1 pill daily at bedtime 3)  Bactrim Ds 800-160 Mg Tabs  (Sulfamethoxazole-trimethoprim) .... One po twice a day for seven days 4)  Ferrex 150 150 Mg Caps (Polysaccharide iron complex) .... One po every day 5)  Reclast 5 Mg/14ml Soln (Zoledronic acid) .... Infuse 5mg  iv once a year (start august 2014) 6)  Diovan 160 Mg Tabs (Valsartan) .... 1/2 tab po daily 7)  Hydrocodone-acetaminophen 5-325 Mg Tabs (Hydrocodone-acetaminophen) .... One po every three hours as needed for pain 8)  Xarelto 20 Mg Tabs (Rivaroxaban) .... Take 1 tablet by mouth with supper 9)  Klor-con 10 10 Meq Cr-tabs (Potassium chloride) .... One po three times daily 10)  Xopenex Hfa 45 Mcg/act Aero (Levalbuterol tartrate) .... Inhale 1 to 2 puffs into the lungs by mouth every 4 hours as needed for wheezing 11)  Furosemide 40 Mg Tabs (Furosemide) .... 3 pills twice a day 12)  Verapamil Hcl Er 360 Mg Xr24h-cap (Verapamil hcl) .... One po every day 13)  Tessalon 200 Mg Caps (Benzonatate) .... Take one tablet by mouth three times per day prn for cough 14)  Bd Pen Needle Mini U/f 31g X 5 Mm Misc (Insulin pen needle) .... For insulin injection up to four times a day 15)  Oxygen  .... Pt is on 4 liters of 02 16)  Cyclobenzaprine Hcl 10 Mg Tabs (Cyclobenzaprine hcl) .Marland Kitchen.. 1 tab qhs 17)  Symbicort 160-4.5 Mcg/act Aero (Budesonide-formoterol fumarate) .... 2 puffs bid 18)  Cymbalta 60 Mg Cpep (Duloxetine hcl) .Marland Kitchen.. 1 pill daily 19)  Lantus 100 Unit/ml Soln (Insulin glargine) .... 70 units once  a day- increase by 1 unit per day until fasting sugar less than 120 20)  Multivitamins Tabs (Multiple vitamin) .... Take one tablet by mouth every day 21)  Omeprazole 20 Mg Tbec (Omeprazole) .Marland Kitchen.. 1 tab qhs 22)  Stool Softener 100 Mg Caps (Docusate sodium) .Marland Kitchen.. 1 capsule twice daily 23)  Calcium + D Tabs (Calcium-vitamin d tabs) .... Take one tablet by mouth twice daily 24)  Neurontin 600 Mg Tabs (Gabapentin) .... Pt takes 1 tid 25)  Alprazolam 0.5 Mg Tabs (Alprazolam) .... 1/2 to one daily 26)  Novolog  Flexpen 100 Unit/ml Soln (Insulin aspart) .... Inject 10 units before each meal 27)  Spiriva Handihaler 18 Mcg Caps (Tiotropium bromide monohydrate) .Marland Kitchen.. 1 capsule inhaled daily 28)  Onetouch Ultra Test Strp (Glucose blood) .... Use one strip per test tid or as directed dx250.60 29)  Daliresp 500 Mcg Tabs (Roflumilast) .Marland Kitchen.. 1 pill daily 30)  Fish Oil 1000 Mg Caps (Omega-3 fatty acids) .... One tablet by mouth once a day   Electronically signed by Link Snuffer MD on 07/08/2013 at 1:23 PM

## 2013-07-09 ENCOUNTER — Inpatient Hospital Stay (HOSPITAL_COMMUNITY): Payer: Medicare Other

## 2013-07-09 DIAGNOSIS — I2789 Other specified pulmonary heart diseases: Secondary | ICD-10-CM

## 2013-07-09 DIAGNOSIS — E861 Hypovolemia: Secondary | ICD-10-CM | POA: Diagnosis not present

## 2013-07-09 DIAGNOSIS — R918 Other nonspecific abnormal finding of lung field: Secondary | ICD-10-CM | POA: Diagnosis not present

## 2013-07-09 DIAGNOSIS — I4891 Unspecified atrial fibrillation: Secondary | ICD-10-CM | POA: Diagnosis not present

## 2013-07-09 DIAGNOSIS — J438 Other emphysema: Secondary | ICD-10-CM

## 2013-07-09 DIAGNOSIS — J9600 Acute respiratory failure, unspecified whether with hypoxia or hypercapnia: Secondary | ICD-10-CM

## 2013-07-09 DIAGNOSIS — N179 Acute kidney failure, unspecified: Secondary | ICD-10-CM | POA: Diagnosis not present

## 2013-07-09 DIAGNOSIS — E119 Type 2 diabetes mellitus without complications: Secondary | ICD-10-CM | POA: Diagnosis not present

## 2013-07-09 DIAGNOSIS — Z452 Encounter for adjustment and management of vascular access device: Secondary | ICD-10-CM | POA: Diagnosis not present

## 2013-07-09 DIAGNOSIS — J9819 Other pulmonary collapse: Secondary | ICD-10-CM | POA: Diagnosis not present

## 2013-07-09 LAB — BASIC METABOLIC PANEL
BUN: 11 mg/dL (ref 6–23)
CHLORIDE: 104 meq/L (ref 96–112)
CO2: 28 meq/L (ref 19–32)
Calcium: 8 mg/dL — ABNORMAL LOW (ref 8.4–10.5)
Creatinine, Ser: 0.72 mg/dL (ref 0.50–1.10)
GFR calc Af Amer: >90 mL/min (ref >90)
GFR calc non Af Amer: 86 mL/min — ABNORMAL LOW (ref >90)
Glucose, Bld: 156 mg/dL — ABNORMAL HIGH (ref 70–99)
Potassium: 3.5 mEq/L — ABNORMAL LOW (ref 3.7–5.3)
Sodium: 142 mEq/L (ref 137–147)

## 2013-07-09 LAB — GLUCOSE, CAPILLARY
GLUCOSE-CAPILLARY: 138 mg/dL — AB (ref 70–99)
Glucose-Capillary: 154 mg/dL — ABNORMAL HIGH (ref 70–99)
Glucose-Capillary: 145 mg/dL — ABNORMAL HIGH (ref 70–99)

## 2013-07-09 LAB — CBC WITH DIFFERENTIAL/PLATELET
BASOS PCT: 0 % (ref 0–1)
Basophils Absolute: 0 10*3/uL (ref 0.0–0.1)
EOS ABS: 0 10*3/uL (ref 0.0–0.7)
Eosinophils Relative: 0 % (ref 0–5)
HEMATOCRIT: 29 % — AB (ref 36.0–46.0)
HEMOGLOBIN: 8.5 g/dL — AB (ref 12.0–15.0)
Lymphocytes Relative: 7 % — ABNORMAL LOW (ref 12–46)
Lymphs Abs: 2.7 10*3/uL (ref 0.7–4.0)
MCH: 21.4 pg — AB (ref 26.0–34.0)
MCHC: 29.3 g/dL — AB (ref 30.0–36.0)
MCV: 72.9 fL — ABNORMAL LOW (ref 78.0–100.0)
MONO ABS: 2.7 10*3/uL — AB (ref 0.1–1.0)
Monocytes Relative: 7 % (ref 3–12)
Neutro Abs: 32.9 10*3/uL — ABNORMAL HIGH (ref 1.7–7.7)
Neutrophils Relative %: 86 % — ABNORMAL HIGH (ref 43–77)
Platelets: 242 10*3/uL (ref 150–400)
RBC: 3.98 MIL/uL (ref 3.87–5.11)
RDW: 17.9 % — ABNORMAL HIGH (ref 11.5–15.5)
WBC: 38.3 10*3/uL — ABNORMAL HIGH (ref 4.0–10.5)

## 2013-07-09 LAB — URINALYSIS, ROUTINE W REFLEX MICROSCOPIC
BILIRUBIN URINE: NEGATIVE
GLUCOSE, UA: NEGATIVE mg/dL
Hgb urine dipstick: NEGATIVE
KETONES UR: NEGATIVE mg/dL
Leukocytes, UA: NEGATIVE
Nitrite: NEGATIVE
Protein, ur: NEGATIVE mg/dL
SPECIFIC GRAVITY, URINE: 1.011 (ref 1.005–1.030)
Urobilinogen, UA: 0.2 mg/dL (ref 0.0–1.0)
pH: 5.5 (ref 5.0–8.0)

## 2013-07-09 LAB — PROCALCITONIN: Procalcitonin: 1.74 ng/mL

## 2013-07-09 LAB — CORTISOL: Cortisol, Plasma: 19.3 ug/dL

## 2013-07-09 MED ORDER — POTASSIUM CHLORIDE CRYS ER 20 MEQ PO TBCR
20.0000 meq | EXTENDED_RELEASE_TABLET | ORAL | Status: AC
  Administered 2013-07-09 (×2): 20 meq via ORAL
  Filled 2013-07-09 (×2): qty 1

## 2013-07-09 MED ORDER — DEXTROSE 5 % IV SOLN
2.0000 ug/min | INTRAVENOUS | Status: DC
  Administered 2013-07-09: 2 ug/min via INTRAVENOUS
  Filled 2013-07-09: qty 4

## 2013-07-09 NOTE — Consult Note (Signed)
[facility]    Name: Destiny Shelton MRN: [id number] DOB: February 21, 1946    ADMISSION DATE:  07/08/2013 CONSULTATION DATE:  2/06  REFERRING MD :  EDP PRIMARY SERVICE: Lang Snow, MD   BRIEF PATIENT DESCRIPTION:  [name] with O2 dependent COPD (followed by [facility]) admitted with dx of CAP, hypotension (presumed septic), marked leukocytosis, AKI. Admitted out of office of Dr Lang Snow. EDP requested PCCM consult due to hypotension  SIGNIFICANT EVENTS / STUDIES:   LINES / TUBES: R IJ CVL 2/7>>>  CULTURES:  RVP nasal swab 2/06 >>  Blood 2/06 >>   ANTIBIOTICS: Oseltamivir 2/06 >>  Azithro 2/06 >>  Ceftriaxone 2/06 >>   SUBJECTIVE:  Still c/o SOB.  Afraid her breathing will "give out".   VITAL SIGNS: Temp:  [98.2 F (36.8 C)-100.4 F (38 C)] 98.2 F (36.8 C) (02/07 0748) Pulse Rate:  [113-127] 115 (02/07 0800) Resp:  [20-29] 27 (02/07 0800) BP: (60-131)/(16-84) 128/48 mmHg (02/07 0800) SpO2:  [85 %-96 %] 92 % (02/07 0807) FiO2 (%):  [6 %-55 %] 55 % (02/07 0807) HEMODYNAMICS: CVP:  [6 mmHg-13 mmHg] 6 mmHg VENTILATOR SETTINGS: Vent Mode:  [-]  FiO2 (%):  [6 %-55 %] 55 % INTAKE / OUTPUT: Intake/Output     02/06 0701 - 02/07 0700 02/07 0701 - 02/08 0700   I.V. 361.9 55   IV Piggyback 300    Total Intake 661.9 55   Urine 2790 350   Total Output 2790 350   Net -2128.1 -295          PHYSICAL EXAMINATION: General: chronically ill appearing female, dyspneic @ rest Neuro:  Awake, alert, appropriate, MAE, generally weak without focal deficits HEENT: NCAT, WNL Cardiovascular:  Tachy, regular, no M noted Lungs: distant BS throughout, diminished bases R>L, no wheezes, No bronchial BS Abdomen:  Obese, soft, +BS, NT Ext: warm without edema  LABS:  CBC  Recent Labs Lab 07/08/13 1425 07/09/13 0504  WBC 46.8* 38.3*  HGB 9.6* 8.5*  HCT 31.9* 29.0*  PLT 272 242   Coag's No results found for this basename: APTT, INR,  in the last 168 hours BMET  Recent Labs Lab  07/08/13 1425 07/09/13 0504  NA 136* 142  K 4.1 3.5*  CL 93* 104  CO2 27 28  BUN 21 11  CREATININE 1.01 0.72  GLUCOSE 182* 156*   Electrolytes  Recent Labs Lab 07/08/13 1425 07/09/13 0504  CALCIUM 8.9 8.0*   Sepsis Markers  Recent Labs Lab 07/08/13 1425 07/09/13 0504  PROCALCITON 2.56 1.74   ABG No results found for this basename: PHART, PCO2ART, PO2ART,  in the last 168 hours Liver Enzymes  Recent Labs Lab 07/08/13 1425  AST 17  ALT 11  ALKPHOS 74  BILITOT 0.7  ALBUMIN 3.3*   Cardiac Enzymes  Recent Labs Lab 07/08/13 1425  TROPONINI <0.30  PROBNP 316.0*   Glucose  Recent Labs Lab 07/08/13 1724 07/09/13 0715  GLUCAP 176* 154*     Dg Chest 2 View  07/08/2013   CLINICAL DATA:  Cough, shortness of breath evaluate for pneumonitis  EXAM: CHEST  2 VIEW  COMPARISON:  DG CHEST 1V PORT dated 05/29/2012  FINDINGS: Low lung volumes. Cardiac silhouette is enlarged. Area of increased density projects in the left lung base. There is blunting of the left costophrenic angle. Prominence of interstitial markings is appreciated without definite peribronchial cuffing. The osseous structures unremarkable.  IMPRESSION: 1. Atelectasis versus infiltrate left lung base. 2. Interstitial findings which  may reflect underlying interstitial pneumonitis or alternatively a component of underlying chronic interstitial fibrosis. There does not appear to be significant peribronchial cuffing appreciated and a component of very mild pulmonary edema is also a diagnostic consideration.   Electronically Signed   By: Margaree Mackintosh M.D.   On: 07/08/2013 15:28   Dg Chest Port 1 View  07/09/2013   CLINICAL DATA:  Cough  EXAM: PORTABLE CHEST - 1 VIEW  COMPARISON:  07/09/2013 0025 hrs  FINDINGS: Cardiac shadow is stable. A right-sided central venous line is again noted. The tip projects over the mid right atrium and is stable in appearance. Retraction is again recommended. Right basilar atelectasis is  noted. No pneumothorax is seen.  IMPRESSION: Increasing right basilar atelectasis.  Stable right jugular line.   Electronically Signed   By: Inez Catalina M.D.   On: 07/09/2013 07:30   Dg Chest Port 1 View  07/09/2013   CLINICAL DATA:  68 -year-old female central line placement. Initial encounter.  EXAM: PORTABLE CHEST - 1 VIEW  COMPARISON:  07/08/2013 and earlier.  FINDINGS: Portable AP semi upright view at 0025 hrs. Right IJ approach central line placed, tip about 8 cm below the carina. Projecting at the level of the right atrium.  No pneumothorax. Increased interstitial opacity. Stable cardiac size and mediastinal contours. Suggestion of small bilateral pleural effusions. Continued retrocardiac opacity.  IMPRESSION: 1. Right IJ central line placed, tip at the level of the right atrium. Retract 2 cm for cavoatrial junction level placement. 2. Increased interstitial opacity, favor vascular congestion. Suspect small pleural effusions. Lower lobe collapse or consolidation.   Electronically Signed   By: Lars Pinks M.D.   On: 07/09/2013 00:38    ASSESSMENT / PLAN:  PULMONARY A: Acute on chronic resp failure Severe O2 dependent COPD, on roflumilast chronically Pulmonary hypertension -  CAP P:   Supplemental O2 - goal SpO2 88-92% Scheduled BD's-- symbicort, daliresp, duonebs Cont roflumilast Continue to monitor in ICU with tenuous resp status  Follow CXR  abx as above No indication systemic steroids   CARDIOVASCULAR A: Hypotension/shock - likely septic.  CVP=11-13 2/7 Sinus tachycardia Paroxysmal AF (Cardiologist: Ellyn Hack) P:  Continue gentle volume  Wean pressors as able - likely wean off soon  Hold stress steroids for now - cortisol ok, BP improving  Holding home verapamil and ARB Low dose PRN metoprolol to maintain HR < 120/min  RENAL A:  Acute renal insuff (baseline Cr 0.69) P:   Monitor BMET intermittently Monitor I/Os Correct electrolytes as  indicated   GASTROINTESTINAL A:  Anorexia Chronic PPI use P:   SUP: PO pantoprazole Tolerating PO diet   HEMATOLOGIC A:  Microcytic anemia (chronic) without overt blood loss Chronic anticoagulation (for PAF and PAH) P:  DVT px: full anticoagulation with Xarelto Iron  Monitor CBC intermittently Transfuse per ICU guidelines  INFECTIOUS A:  CAP, NOS Doubt influenza based on CXR pattern Severe leukocytosis - trending down  P:   Micro and abx as above PCT algorithm Would DC oseltamivir if influenza neg on RVP  ENDOCRINE A:  DM 2 P:   SSI q 4 hrs Change to ACHS once diet advanced  NEUROLOGIC A: Hx of depression P:   Cont home meds Delirium prevention measures   I have personally obtained a history, examined the patient, evaluated laboratory and imaging results, formulated the assessment and plan and placed orders.   Pt and husband updated   Darlina Sicilian, NP 07/09/2013  9:07 AM Pager: [phone] or [phone]  *Care during the described time interval was provided by me and/or other providers on the critical care team. I have reviewed this patient's available data, including medical history, events of note, physical examination and test results as part of my evaluation.   Baltazar Apo, MD, PhD 07/09/2013, 1:37 PM  Pulmonary and Critical Care [phone] or if no answer [phone]

## 2013-07-09 NOTE — Progress Notes (Signed)
PT Cancellation Note  Patient Details Name: Destiny Shelton MRN: [id number] DOB: 09/16/1945   Cancelled Treatment:    Reason Eval/Treat Not Completed: Medical issues which prohibited therapy  Per RN wait until 2/8.  07/09/2013  Donnella Sham, PT [phone]  (pager)   Skila Rollins, Tessie Fass 07/09/2013, 5:30 PM

## 2013-07-09 NOTE — Progress Notes (Signed)
Patient instructed on the Flutter and the Incentive Spirometer. Patient did both devices about 6 times each and achieved 948mL at best on the IS.  Myrtie Neither, RRT, RCP

## 2013-07-09 NOTE — Procedures (Signed)
Central Venous Catheter Insertion Procedure Note Destiny Shelton [id number] [birth year]  Procedure: Insertion of Central Venous Catheter Indications: Assessment of intravascular volume, Drug and/or fluid administration and Frequent blood sampling  Procedure Details Consent: Risks of procedure as well as the alternatives and risks of each were explained to the (patient/caregiver).  Consent for procedure obtained. Time Out: Verified patient identification, verified procedure, site/side was marked, verified correct patient position, special equipment/implants available, medications/allergies/relevent history reviewed, required imaging and test results available.  Performed  Maximum sterile technique was used including antiseptics, cap, gloves, gown, hand hygiene, mask and sheet. Skin prep: Chlorhexidine; local anesthetic administered A antimicrobial bonded/coated triple lumen catheter was placed in the right internal jugular vein using the Seldinger technique.  Evaluation Blood flow good Complications: No apparent complications Patient did tolerate procedure well. Chest X-ray ordered to verify placement.  CXR: pending.  Timmothy Sours, M.D. Pulmonary and Critical Care Medicine Pager [phone] Call [place] with questions [phone] 07/09/2013, 12:19 AM

## 2013-07-09 NOTE — Progress Notes (Signed)
Subjective: Patient is currently in the medical intensive care unit and followed closely by the intensivist team. Since admission oxygen saturation has stabilized and systolic blood pressures if stabilized in the low 100 range after insertion of a CVP line revealing hypovolemia and cysts signs and symptoms compatible with septic shock. She is currently awake with her family at the bedside and has eaten about 10% of her meal. Relates that she is breathing better and is comfortable.  Objective: Vital signs in last 24 hours: Temp:  [98.2 F (36.8 C)-100.4 F (38 C)] 98.2 F (36.8 C) (02/07 0748) Pulse Rate:  [113-127] 115 (02/07 0800) Resp:  [20-29] 27 (02/07 0800) BP: (60-131)/(16-84) 128/48 mmHg (02/07 0800) SpO2:  [85 %-96 %] 92 % (02/07 0807) FiO2 (%):  [6 %-55 %] 55 % (02/07 0807) Weight change:   CBG (last 3)   Recent Labs  07/08/13 1724 07/09/13 0715  GLUCAP 176* 154*    Intake/Output from previous day: 02/06 0701 - 02/07 0700 In: 661.9 [I.V.:361.9; IV Piggyback:300] Out: 2790 [Urine:2790]  Physical Exam: Patient is awake and alert sitting up wearing a Ventimask no increased work of breathing at this time Good facial symmetry no obvious JVD and no excessive muscle utilization Chest reveals good breath sounds throughout a bit distant few by basilar rales no rhonchi Cardiovascular exam tachycardic regular distant heart sounds no obvious murmur Abdomen is soft nontender good bowel sounds Extremities warm distally intact pulses Neurologically diffusely weak conversant nonlateralizing   Lab Results:  Recent Labs  07/08/13 1425 07/09/13 0504  NA 136* 142  K 4.1 3.5*  CL 93* 104  CO2 27 28  GLUCOSE 182* 156*  BUN 21 11  CREATININE 1.01 0.72  CALCIUM 8.9 8.0*    Recent Labs  07/08/13 1425  AST 17  ALT 11  ALKPHOS 74  BILITOT 0.7  PROT 7.7  ALBUMIN 3.3*    Recent Labs  07/08/13 1425 07/09/13 0504  WBC 46.8* 38.3*  NEUTROABS  --  32.9*  HGB 9.6* 8.5*   HCT 31.9* 29.0*  MCV 72.2* 72.9*  PLT 272 242   Lab Results  Component Value Date   INR 1.21 05/26/2012   INR 0.96 12/19/2011   INR 1.0 09/06/2007    Recent Labs  07/08/13 1425  TROPONINI <0.30   No results found for this basename: TSH, T4TOTAL, FREET3, T3FREE, THYROIDAB,  in the last 72 hours No results found for this basename: VITAMINB12, FOLATE, FERRITIN, TIBC, IRON, RETICCTPCT,  in the last 72 hours  Studies/Results: Dg Chest 2 View  07/08/2013   CLINICAL DATA:  Cough, shortness of breath evaluate for pneumonitis  EXAM: CHEST  2 VIEW  COMPARISON:  DG CHEST 1V PORT dated 05/29/2012  FINDINGS: Low lung volumes. Cardiac silhouette is enlarged. Area of increased density projects in the left lung base. There is blunting of the left costophrenic angle. Prominence of interstitial markings is appreciated without definite peribronchial cuffing. The osseous structures unremarkable.  IMPRESSION: 1. Atelectasis versus infiltrate left lung base. 2. Interstitial findings which may reflect underlying interstitial pneumonitis or alternatively a component of underlying chronic interstitial fibrosis. There does not appear to be significant peribronchial cuffing appreciated and a component of very mild pulmonary edema is also a diagnostic consideration.   Electronically Signed   By: Margaree Mackintosh M.D.   On: 07/08/2013 15:28   Dg Chest Port 1 View  07/09/2013   CLINICAL DATA:  Cough  EXAM: PORTABLE CHEST - 1 VIEW  COMPARISON:  07/09/2013 0025  hrs  FINDINGS: Cardiac shadow is stable. A right-sided central venous line is again noted. The tip projects over the mid right atrium and is stable in appearance. Retraction is again recommended. Right basilar atelectasis is noted. No pneumothorax is seen.  IMPRESSION: Increasing right basilar atelectasis.  Stable right jugular line.   Electronically Signed   By: Inez Catalina M.D.   On: 07/09/2013 07:30   Dg Chest Port 1 View  07/09/2013   CLINICAL DATA:  67  -year-old female central line placement. Initial encounter.  EXAM: PORTABLE CHEST - 1 VIEW  COMPARISON:  07/08/2013 and earlier.  FINDINGS: Portable AP semi upright view at 0025 hrs. Right IJ approach central line placed, tip about 8 cm below the carina. Projecting at the level of the right atrium.  No pneumothorax. Increased interstitial opacity. Stable cardiac size and mediastinal contours. Suggestion of small bilateral pleural effusions. Continued retrocardiac opacity.  IMPRESSION: 1. Right IJ central line placed, tip at the level of the right atrium. Retract 2 cm for cavoatrial junction level placement. 2. Increased interstitial opacity, favor vascular congestion. Suspect small pleural effusions. Lower lobe collapse or consolidation.   Electronically Signed   By: Lars Pinks M.D.   On: 07/09/2013 00:38     Assessment/Plan: #1 acute respiratory failure- oxygen dependent COPD as well as pulmonary hypertension with possible new basilar infiltrate and lower lobe collapse currently on IV antibiotics and roflumilast, stable oxygenation and ventilation at this time  #2 hypovolemic shock likely sepsis critical care team resuscitating with volume following CVP closely  #3 atrial fibrillation rapid ventricular response currently stable rate appropriate for illness  #4 diabetes mellitus type 2 stable  #5 acute renal failure stabilizing with volume  Currently antibiotics and volume continue. Critical care following closely we'll continue to support   LOS: 1 day   Edra Riccardi A 07/09/2013, 10:38 AM

## 2013-07-09 NOTE — Progress Notes (Signed)
[facility] ADULT ICU REPLACEMENT PROTOCOL FOR AM LAB REPLACEMENT ONLY  The patient does apply for the [facility] Adult ICU Electrolyte Replacment Protocol based on the criteria listed below:   1. Is GFR >/= 40 ml/min? yes  Patient's GFR today is 86 2. Is urine output >/= 0.5 ml/kg/hr for the last 6 hours? yes Patient's UOP is 2.5 ml/kg/hr 3. Is BUN < 60 mg/dL? yes  Patient's BUN today is 11 4. Abnormal electrolyte(s): K 3.5 5. Ordered repletion with: per protocol 6. If a panic level lab has been reported, has the CCM MD in charge been notified? no.   Physician:    Ronda Fairly A 07/09/2013 6:05 AM

## 2013-07-10 ENCOUNTER — Inpatient Hospital Stay (HOSPITAL_COMMUNITY): Payer: Medicare Other

## 2013-07-10 DIAGNOSIS — J189 Pneumonia, unspecified organism: Secondary | ICD-10-CM

## 2013-07-10 DIAGNOSIS — E861 Hypovolemia: Secondary | ICD-10-CM | POA: Diagnosis not present

## 2013-07-10 DIAGNOSIS — J9600 Acute respiratory failure, unspecified whether with hypoxia or hypercapnia: Secondary | ICD-10-CM | POA: Diagnosis not present

## 2013-07-10 DIAGNOSIS — I4891 Unspecified atrial fibrillation: Secondary | ICD-10-CM | POA: Diagnosis not present

## 2013-07-10 DIAGNOSIS — E119 Type 2 diabetes mellitus without complications: Secondary | ICD-10-CM | POA: Diagnosis not present

## 2013-07-10 DIAGNOSIS — N179 Acute kidney failure, unspecified: Secondary | ICD-10-CM | POA: Diagnosis not present

## 2013-07-10 LAB — BASIC METABOLIC PANEL
BUN: 8 mg/dL (ref 6–23)
CALCIUM: 8.4 mg/dL (ref 8.4–10.5)
CO2: 27 mEq/L (ref 19–32)
Chloride: 103 mEq/L (ref 96–112)
Creatinine, Ser: 0.68 mg/dL (ref 0.50–1.10)
GFR calc Af Amer: >90 mL/min (ref >90)
GFR, EST NON AFRICAN AMERICAN: 88 mL/min — AB (ref >90)
Glucose, Bld: 134 mg/dL — ABNORMAL HIGH (ref 70–99)
Potassium: 4.4 mEq/L (ref 3.7–5.3)
SODIUM: 140 meq/L (ref 137–147)

## 2013-07-10 LAB — GLUCOSE, CAPILLARY
Glucose-Capillary: 133 mg/dL — ABNORMAL HIGH (ref 70–99)
Glucose-Capillary: 162 mg/dL — ABNORMAL HIGH (ref 70–99)
Glucose-Capillary: 161 mg/dL — ABNORMAL HIGH (ref 70–99)
Glucose-Capillary: 115 mg/dL — ABNORMAL HIGH (ref 70–99)

## 2013-07-10 LAB — CBC
HCT: 28.4 % — ABNORMAL LOW (ref 36.0–46.0)
Hemoglobin: 8.2 g/dL — ABNORMAL LOW (ref 12.0–15.0)
MCH: 21.4 pg — ABNORMAL LOW (ref 26.0–34.0)
MCHC: 28.9 g/dL — ABNORMAL LOW (ref 30.0–36.0)
MCV: 74 fL — AB (ref 78.0–100.0)
PLATELETS: 231 10*3/uL (ref 150–400)
RBC: 3.84 MIL/uL — AB (ref 3.87–5.11)
RDW: 18.1 % — ABNORMAL HIGH (ref 11.5–15.5)
WBC: 19.6 10*3/uL — ABNORMAL HIGH (ref 4.0–10.5)

## 2013-07-10 LAB — RESPIRATORY VIRUS PANEL
Adenovirus: NOT DETECTED
INFLUENZA A H3: NOT DETECTED
INFLUENZA A: NOT DETECTED
INFLUENZA B 1: NOT DETECTED
Influenza A H1: NOT DETECTED
Metapneumovirus: NOT DETECTED
PARAINFLUENZA 1 A: NOT DETECTED
PARAINFLUENZA 2 A: NOT DETECTED
Parainfluenza 3: NOT DETECTED
RESPIRATORY SYNCYTIAL VIRUS A: NOT DETECTED
RESPIRATORY SYNCYTIAL VIRUS B: NOT DETECTED
Rhinovirus: NOT DETECTED

## 2013-07-10 LAB — PROCALCITONIN: PROCALCITONIN: 1.14 ng/mL

## 2013-07-10 LAB — PHOSPHORUS: Phosphorus: 2.5 mg/dL (ref 2.3–4.6)

## 2013-07-10 LAB — MAGNESIUM: MAGNESIUM: 2.5 mg/dL (ref 1.5–2.5)

## 2013-07-10 NOTE — Progress Notes (Signed)
Subjective: Patient is sitting up in a chair actually looks great today. Husband is at the bedside. She relates that her breathing is close to her baseline and she is oxygen dependent at home. By mouth intake has been good. Denies any pain. She's able to speak freely without getting breathless.  Objective: Vital signs in last 24 hours: Temp:  [97.5 F (36.4 C)-98.4 F (36.9 C)] 98 F (36.7 C) (02/08 0722) Pulse Rate:  [107-115] 107 (02/08 1000) Resp:  [17-28] 24 (02/08 1000) BP: (86-121)/(37-55) 107/48 mmHg (02/08 1000) SpO2:  [90 %-97 %] 90 % (02/08 1000) FiO2 (%):  [55 %] 55 % (02/08 0400) Weight change:   CBG (last 3)   Recent Labs  07/09/13 1105 07/09/13 1719 07/10/13 0702  GLUCAP 138* 145* 133*    Intake/Output from previous day: 02/07 0701 - 02/08 0700 In: 1375 [P.O.:880; I.V.:495] Out: 2650 [Urine:2650]  Physical Exam: Patient is sitting up smiling wearing an oxygen cannula speaking easily Good facial symmetry no JVD no excess or muscle utilization Chest is relatively clear good breath sounds a few bibasilar rales no rhonchi Cardiovascular rate in the 90s no obvious murmur regular Abdomen is obese soft nontender good bowel sounds Extremities warm distally no edema intact distal pulses Neurologic exam higher cortical functioning intact moves extremities x4 diffusely weak nonlateralizing   Lab Results:  Recent Labs  07/09/13 0504 07/10/13 0245  NA 142 140  K 3.5* 4.4  CL 104 103  CO2 28 27  GLUCOSE 156* 134*  BUN 11 8  CREATININE 0.72 0.68  CALCIUM 8.0* 8.4  MG  --  2.5  PHOS  --  2.5    Recent Labs  07/08/13 1425  AST 17  ALT 11  ALKPHOS 74  BILITOT 0.7  PROT 7.7  ALBUMIN 3.3*    Recent Labs  07/09/13 0504 07/10/13 0245  WBC 38.3* 19.6*  NEUTROABS 32.9*  --   HGB 8.5* 8.2*  HCT 29.0* 28.4*  MCV 72.9* 74.0*  PLT 242 231   Lab Results  Component Value Date   INR 1.21 05/26/2012   INR 0.96 12/19/2011   INR 1.0 09/06/2007     Recent Labs  07/08/13 1425  TROPONINI <0.30   No results found for this basename: TSH, T4TOTAL, FREET3, T3FREE, THYROIDAB,  in the last 72 hours No results found for this basename: VITAMINB12, FOLATE, FERRITIN, TIBC, IRON, RETICCTPCT,  in the last 72 hours  Studies/Results: Dg Chest 2 View  07/08/2013   CLINICAL DATA:  Cough, shortness of breath evaluate for pneumonitis  EXAM: CHEST  2 VIEW  COMPARISON:  DG CHEST 1V PORT dated 05/29/2012  FINDINGS: Low lung volumes. Cardiac silhouette is enlarged. Area of increased density projects in the left lung base. There is blunting of the left costophrenic angle. Prominence of interstitial markings is appreciated without definite peribronchial cuffing. The osseous structures unremarkable.  IMPRESSION: 1. Atelectasis versus infiltrate left lung base. 2. Interstitial findings which may reflect underlying interstitial pneumonitis or alternatively a component of underlying chronic interstitial fibrosis. There does not appear to be significant peribronchial cuffing appreciated and a component of very mild pulmonary edema is also a diagnostic consideration.   Electronically Signed   By: Margaree Mackintosh M.D.   On: 07/08/2013 15:28   Dg Chest Port 1 View  07/10/2013   CLINICAL DATA:  Pneumonia  EXAM: PORTABLE CHEST - 1 VIEW  COMPARISON:  07/09/2013  FINDINGS: A right-sided central venous line is again identified in the mid atrium and  stable. This could be withdrawn 1-2 cm. The lungs are well aerated bilaterally. Increased vascular congestion and interstitial changes are noted. Some increasing left basilar atelectasis is noted as well. No bony abnormality is seen.  IMPRESSION: Vascular congestion and mild interstitial edema. Increasing left basilar infiltrate. The right basilar atelectasis has improved slightly in the interval.   Electronically Signed   By: Inez Catalina M.D.   On: 07/10/2013 07:21   Dg Chest Port 1 View  07/09/2013   CLINICAL DATA:  Cough  EXAM:  PORTABLE CHEST - 1 VIEW  COMPARISON:  07/09/2013 0025 hrs  FINDINGS: Cardiac shadow is stable. A right-sided central venous line is again noted. The tip projects over the mid right atrium and is stable in appearance. Retraction is again recommended. Right basilar atelectasis is noted. No pneumothorax is seen.  IMPRESSION: Increasing right basilar atelectasis.  Stable right jugular line.   Electronically Signed   By: Inez Catalina M.D.   On: 07/09/2013 07:30   Dg Chest Port 1 View  07/09/2013   CLINICAL DATA:  68 -year-old female central line placement. Initial encounter.  EXAM: PORTABLE CHEST - 1 VIEW  COMPARISON:  07/08/2013 and earlier.  FINDINGS: Portable AP semi upright view at 0025 hrs. Right IJ approach central line placed, tip about 8 cm below the carina. Projecting at the level of the right atrium.  No pneumothorax. Increased interstitial opacity. Stable cardiac size and mediastinal contours. Suggestion of small bilateral pleural effusions. Continued retrocardiac opacity.  IMPRESSION: 1. Right IJ central line placed, tip at the level of the right atrium. Retract 2 cm for cavoatrial junction level placement. 2. Increased interstitial opacity, favor vascular congestion. Suspect small pleural effusions. Lower lobe collapse or consolidation.   Electronically Signed   By: Lars Pinks M.D.   On: 07/09/2013 00:38     Assessment/Plan: #1 acute respiratory failure in the setting of oxygen dependent COPD and pulmonary hypertension with possible new basilar infiltrate improved clinically close to baseline  #2 hypovolemic shock likely related to sepsis resuscitated with IV fluids CVP has stabilized ready for transfer to the floor  #3 paroxysmal atrial fibrillation rate controlled  #4 diabetes mellitus type 2 stable  #5 acute renal failure resolved with volume  Plan appreciate critical care support we'll transfer to the floor at this time she is close to her baseline   LOS: 2 days    Judit Awad A 07/10/2013, 10:49 AM

## 2013-07-10 NOTE — Discharge Instructions (Addendum)
Information on my medicine - XARELTO (Rivaroxaban)  This medication education was reviewed with me or my healthcare representative as part of my discharge preparation.  The pharmacist that spoke with me during my hospital stay was:  Angelica Chessman, [facility]  Why was Xarelto prescribed for you? Xarelto was prescribed for you to reduce the risk of a blood clot forming that can cause a stroke if you have a medical condition called atrial fibrillation (a type of irregular heartbeat).  What do you need to know about xarelto ? Take your Xarelto ONCE DAILY at the same time every day with your evening meal. If you have difficulty swallowing the tablet whole, you may crush it and mix in applesauce just prior to taking your dose.  Take Xarelto exactly as prescribed by your doctor and DO NOT stop taking Xarelto without talking to the doctor who prescribed the medication.  Stopping without other stroke prevention medication to take the place of Xarelto may increase your risk of developing a clot that causes a stroke.  Refill your prescription before you run out.  After discharge, you should have regular check-up appointments with your healthcare provider that is prescribing your Xarelto.  In the future your dose may need to be changed if your kidney function or weight changes by a significant amount.  What do you do if you miss a dose? If you are taking Xarelto ONCE DAILY and you miss a dose, take it as soon as you remember on the same day then continue your regularly scheduled once daily regimen the next day. Do not take two doses of Xarelto at the same time or on the same day.   Important Safety Information A possible side effect of Xarelto is bleeding. You should call your healthcare provider right away if you experience any of the following:   Bleeding from an injury or your nose that does not stop.   Unusual colored urine (red or dark brown) or unusual colored stools (red or black).   Unusual  bruising for unknown reasons.   A serious fall or if you hit your head (even if there is no bleeding).  Some medicines may interact with Xarelto and might increase your risk of bleeding while on Xarelto. To help avoid this, consult your healthcare provider or pharmacist prior to using any new prescription or non-prescription medications, including herbals, vitamins, non-steroidal anti-inflammatory drugs (NSAIDs) and supplements.  This website has more information on Xarelto: [email or web address].[email or web address]  Chronic Obstructive Pulmonary Disease Chronic obstructive pulmonary disease (COPD) is a common lung condition in which airflow from the lungs is limited. COPD is a general term that can be used to describe many different lung problems that limit airflow, including both chronic bronchitis and emphysema. If you have COPD, your lung function will probably never return to normal, but there are measures you can take to improve lung function and make yourself feel better.  CAUSES   Smoking (common).   Exposure to secondhand smoke.   Genetic problems.  Chronic inflammatory lung diseases or recurrent infections. SYMPTOMS   Shortness of breath, especially with physical activity.   Deep, persistent (chronic) cough with a large amount of thick mucus.   Wheezing.   Rapid breaths (tachypnea).   Gray or bluish discoloration (cyanosis) of the skin, especially in fingers, toes, or lips.   Fatigue.   Weight loss.   Frequent infections or episodes when breathing symptoms become much worse (exacerbations).   Chest tightness. DIAGNOSIS  Your healthcare provider will take  a medical history and perform a physical examination to make the initial diagnosis. Additional tests for COPD may include:   Lung (pulmonary) function tests.  Chest X-ray.  CT scan.  Blood tests. TREATMENT  Treatment available to help you feel better when you have COPD include:   Inhaler and nebulizer medicines.  These help manage the symptoms of COPD and make your breathing more comfortable  Supplemental oxygen. Supplemental oxygen is only helpful if you have a low oxygen level in your blood.   Exercise and physical activity. These are beneficial for nearly all people with COPD. Some people may also benefit from a pulmonary rehabilitation program. HOME CARE INSTRUCTIONS   Take all medicines (inhaled or pills) as directed by your health care provider.  Only take over-the-counter or prescription medicines for pain, fever, or discomfort as directed by your health care provider.   Avoid over-the-counter medicines or cough syrups that dry up your airway (such as antihistamines) and slow down the elimination of secretions unless instructed otherwise by your healthcare provider.   If you are a smoker, the most important thing that you can do is stop smoking. Continuing to smoke will cause further lung damage and breathing trouble. Ask your health care provider for help with quitting smoking. He or she can direct you to community resources or hospitals that provide support.  Avoid exposure to irritants such as smoke, chemicals, and fumes that aggravate your breathing.  Use oxygen therapy and pulmonary rehabilitation if directed by your health care provider. If you require home oxygen therapy, ask your healthcare provider whether you should purchase a pulse oximeter to measure your oxygen level at home.   Avoid contact with individuals who have a contagious illness.  Avoid extreme temperature and humidity changes.  Eat healthy foods. Eating smaller, more frequent meals and resting before meals may help you maintain your strength.  Stay active, but balance activity with periods of rest. Exercise and physical activity will help you maintain your ability to do things you want to do.  Preventing infection and hospitalization is very important when you have COPD. Make sure to receive all the vaccines your  health care provider recommends, especially the pneumococcal and influenza vaccines. Ask your healthcare provider whether you need a pneumonia vaccine.  Learn and use relaxation techniques to manage stress.  Learn and use controlled breathing techniques as directed by your health care provider. Controlled breathing techniques include:   Pursed lip breathing. Start by breathing in (inhaling) through your nose for 1 second. Then, purse your lips as if you were going to whistle and breathe out (exhale) through the pursed lips for 2 seconds.   Diaphragmatic breathing. Start by putting one hand on your abdomen just above your waist. Inhale slowly through your nose. The hand on your abdomen should move out. Then purse your lips and exhale slowly. You should be able to feel the hand on your abdomen moving in as you exhale.   Learn and use controlled coughing to clear mucus from your lungs. Controlled coughing is a series of short, progressive coughs. The steps of controlled coughing are:  1. Lean your head slightly forward.  2. Breathe in deeply using diaphragmatic breathing.  3. Try to hold your breath for 3 seconds.  4. Keep your mouth slightly open while coughing twice.  5. Spit any mucus out into a tissue.  6. Rest and repeat the steps once or twice as needed. SEEK MEDICAL CARE IF:   You are coughing up  more mucus than usual.   There is a change in the color or thickness of your mucus.   Your breathing is more labored than usual.   Your breathing is faster than usual.  SEEK IMMEDIATE MEDICAL CARE IF:   You have shortness of breath while you are resting.   You have shortness of breath that prevents you from:  Being able to talk.   Performing your usual physical activities.   You have chest pain lasting longer than 5 minutes.   Your skin color is more cyanotic than usual.  You measure low oxygen saturations for longer than 5 minutes with a pulse oximeter. MAKE  SURE YOU:   Understand these instructions.  Will watch your condition.  Will get help right away if you are not doing well or get worse. Document Released: 02/26/2005 Document Revised: 03/09/2013 Document Reviewed: 01/13/2013 [facility] Patient Information 2014 [place], [place].

## 2013-07-10 NOTE — Progress Notes (Signed)
Droplet precaution dcd, negative pcr. Central line dcd, cath intact, site bruised, occlusive dressing intact. Foley cath dcd at 11 am , cath intact. Tolerated well. Tiffany Kocher , CCm NP, made aware of patients vitals and current condition.

## 2013-07-10 NOTE — Evaluation (Signed)
Physical Therapy Evaluation Patient Details Name: Destiny Shelton MRN: [id number] DOB: [birth year] Today's Date: 07/10/2013 Time: 6269-4854 PT Time Calculation (min): 41 min  PT Assessment / Plan / Recommendation History of Present Illness   68 year old white female with a history of oxygen dependent COPD, A. fib with RVR, and pulmonary hypertension.  Presented to PCP with a complaint of increased shortness of breath, and muscle aches. She states that she was in her usual state of health (chronically poor but recently stable) until she developed leg stiffness about 2 days PTA. CAP, hypotension, sepsis, leukocytosis, AKI.   Clinical Impression  Pt presents for PT eval with improved respiratory function, remains on oxygen 5 liters, returning to baseline. History of gait instability with last fall >1 year ago, but remains limited in ability to sustain activity >5-10 minutes.  Recommend HHPT to address balance function and cardiopulmonary conditioning to improve quality of life and minimize risk for rehosptialization.  PT will continue to follow acutely until d/c.  Recommend up with nursing to walk halls, use RW for safety.    PT Assessment  Patient needs continued PT services    Follow Up Recommendations  Home health PT;Supervision/Assistance - 24 hour    Does the patient have the potential to tolerate intense rehabilitation      Barriers to Discharge        Equipment Recommendations  3in1 (PT)    Recommendations for Other Services     Frequency Min 3X/week    Precautions / Restrictions Precautions Precautions: Fall   Pertinent Vitals/Pain incr DOE after toileting      Mobility  Bed Mobility Overal bed mobility:  (sitting EOB) Transfers Overall transfer level: Needs assistance Equipment used: 1 person hand held assist Transfers: Sit to/from Stand Sit to Stand: Supervision General transfer comment: standby assist or provide external support to push/pull when no rails or grab  bars Ambulation/Gait Ambulation/Gait assistance: Mod assist Ambulation Distance (Feet): 10 Feet Assistive device: 1 person hand held assist Gait Pattern/deviations: Step-to pattern;Wide base of support General Gait Details: to/from BR due to no oxygen carrier to walk halls.  waddling gait, needs increased time, especially when SOB after toileting    Exercises     PT Diagnosis: Difficulty walking  PT Problem List: Obesity;Cardiopulmonary status limiting activity;Decreased knowledge of use of DME;Decreased mobility;Decreased balance;Decreased activity tolerance PT Treatment Interventions: Patient/family education;Balance training;Therapeutic exercise;Therapeutic activities;Functional mobility training;Gait training;DME instruction     PT Goals(Current goals can be found in the care plan section) Acute Rehab PT Goals Patient Stated Goal: get around easier PT Goal Formulation: With patient/family Time For Goal Achievement: 07/24/13 Potential to Achieve Goals: Good  Visit Information  Last PT Received On: 07/10/13 Assistance Needed: +1       Prior [place] expects to be discharged to:: Private residence Living Arrangements: Spouse/significant other Available Help at Discharge: Family;Available 24 hours/day Type of Home: House Home Access: Level entry Home Layout: One level Home Equipment: Walker - 4 wheels;Cane - single point;Shower seat Prior Function Level of Independence: Independent with assistive device(s);Needs assistance ADL's / Homemaking Assistance Needed: bathing supervision occassionally; housework Communication Communication: No difficulties (raspy vocal quality)    Cognition  Cognition Arousal/Alertness: Awake/alert Behavior During Therapy: WFL for tasks assessed/performed Overall Cognitive Status: Within Functional Limits for tasks assessed    Extremity/Trunk Assessment Upper Extremity Assessment Upper Extremity Assessment:  Overall WFL for tasks assessed Lower Extremity Assessment Lower Extremity Assessment: Generalized weakness Cervical / Trunk Assessment Cervical / Trunk Assessment:  Normal (obese)   Balance Balance Overall balance assessment: Needs assistance;History of Falls Sitting-balance support: No upper extremity supported;Feet supported Sitting balance-Leahy Scale: Good Postural control: Other (comment) (unstable when turning) Standing balance support: Bilateral upper extremity supported;During functional activity Standing balance comment: LOB when turning or rotating trunk, needs assist to prevent fall Single Leg Stance - Right Leg: 0 Single Leg Stance - Left Leg: 0 Tandem Stance - Right Leg: 0 Tandem Stance - Left Leg: 0 Rhomberg - Eyes Opened: 30 (cannot get feet completely together) Rhomberg - Eyes Closed: 15  End of Session PT - End of Session Equipment Utilized During Treatment: Gait belt;Oxygen (O2 to wall) Activity Tolerance: Patient limited by fatigue (limited by dyspnea) Patient left: in bed;with call bell/phone within reach;with family/visitor present Nurse Communication: Mobility status  GP     Herbie Drape 07/10/2013, 4:07 PM

## 2013-07-10 NOTE — Progress Notes (Signed)
[place] PCCM    Name: Destiny Shelton MRN: [id number] DOB: [birth year]    ADMISSION DATE:  07/08/2013 CONSULTATION DATE:  2/06  REFERRING MD :  EDP PRIMARY SERVICE: Lang Snow, MD   BRIEF PATIENT DESCRIPTION:  21 F with O2 dependent COPD (followed by [facility]) admitted with dx of CAP, hypotension (presumed septic), marked leukocytosis, AKI. Admitted out of office of Dr Lang Snow. EDP requested PCCM consult due to hypotension  SIGNIFICANT EVENTS / STUDIES:   LINES / TUBES: R IJ CVL 2/7>>>  CULTURES:  RVP nasal swab 2/06 >> neg Blood 2/06 >>   ANTIBIOTICS: Oseltamivir 2/06 >> 2/8 Azithro 2/06 >>  Ceftriaxone 2/06 >>   SUBJECTIVE:  Much improved.  OOB in chair eating breakfast.  Denies SOB.  Feeling great.  Wants to go home.   VITAL SIGNS: Temp:  [97.5 F (36.4 C)-98.4 F (36.9 C)] 98 F (36.7 C) (02/08 0722) Pulse Rate:  [108-117] 109 (02/08 0600) Resp:  [17-28] 24 (02/08 0600) BP: (86-121)/(37-55) 121/53 mmHg (02/08 0600) SpO2:  [90 %-97 %] 90 % (02/08 0915) FiO2 (%):  [55 %] 55 % (02/08 0400) HEMODYNAMICS: CVP:  [7 mmHg-12 mmHg] 7 mmHg VENTILATOR SETTINGS: Vent Mode:  [-]  FiO2 (%):  [55 %] 55 % INTAKE / OUTPUT: Intake/Output     02/07 0701 - 02/08 0700 02/08 0701 - 02/09 0700   P.O. 880    I.V. 475    IV Piggyback     Total Intake 1355     Urine 2650    Total Output 2650     Net -1295            PHYSICAL EXAMINATION: General: chronically ill appearing female, NAD OOB in chair  Neuro:  Awake, alert, appropriate, MAE, generally weak without focal deficits HEENT: NCAT, WNL Cardiovascular:  Tachy, regular, no M noted Lungs: distant BS throughout, diminished bases R>L, no wheezes, few scattered crackles  Abdomen:  Obese, soft, +BS, NT Ext: warm without edema  LABS:  CBC  Recent Labs Lab 07/08/13 1425 07/09/13 0504 07/10/13 0245  WBC 46.8* 38.3* 19.6*  HGB 9.6* 8.5* 8.2*  HCT 31.9* 29.0* 28.4*  PLT 272 242 231   Coag's No results found for this  basename: APTT, INR,  in the last 168 hours BMET  Recent Labs Lab 07/08/13 1425 07/09/13 0504 07/10/13 0245  NA 136* 142 140  K 4.1 3.5* 4.4  CL 93* 104 103  CO2 27 28 27   BUN 21 11 8   CREATININE 1.01 0.72 0.68  GLUCOSE 182* 156* 134*   Electrolytes  Recent Labs Lab 07/08/13 1425 07/09/13 0504 07/10/13 0245  CALCIUM 8.9 8.0* 8.4  MG  --   --  2.5  PHOS  --   --  2.5   Sepsis Markers  Recent Labs Lab 07/08/13 1425 07/09/13 0504 07/10/13 0245  PROCALCITON 2.56 1.74 1.14   ABG No results found for this basename: PHART, PCO2ART, PO2ART,  in the last 168 hours Liver Enzymes  Recent Labs Lab 07/08/13 1425  AST 17  ALT 11  ALKPHOS 74  BILITOT 0.7  ALBUMIN 3.3*   Cardiac Enzymes  Recent Labs Lab 07/08/13 1425  TROPONINI <0.30  PROBNP 316.0*   Glucose  Recent Labs Lab 07/08/13 1724 07/09/13 0715 07/09/13 1105 07/09/13 1719 07/10/13 0702  GLUCAP 176* 154* 138* 145* 133*     Dg Chest 2 View  07/08/2013   CLINICAL DATA:  Cough, shortness of breath evaluate for pneumonitis  EXAM: CHEST  2 VIEW  COMPARISON:  DG CHEST 1V PORT dated 05/29/2012  FINDINGS: Low lung volumes. Cardiac silhouette is enlarged. Area of increased density projects in the left lung base. There is blunting of the left costophrenic angle. Prominence of interstitial markings is appreciated without definite peribronchial cuffing. The osseous structures unremarkable.  IMPRESSION: 1. Atelectasis versus infiltrate left lung base. 2. Interstitial findings which may reflect underlying interstitial pneumonitis or alternatively a component of underlying chronic interstitial fibrosis. There does not appear to be significant peribronchial cuffing appreciated and a component of very mild pulmonary edema is also a diagnostic consideration.   Electronically Signed   By: Salome Holmes M.D.   On: 07/08/2013 15:28   Dg Chest Port 1 View  07/10/2013   CLINICAL DATA:  Pneumonia  EXAM: PORTABLE CHEST - 1 VIEW   COMPARISON:  07/09/2013  FINDINGS: A right-sided central venous line is again identified in the mid atrium and stable. This could be withdrawn 1-2 cm. The lungs are well aerated bilaterally. Increased vascular congestion and interstitial changes are noted. Some increasing left basilar atelectasis is noted as well. No bony abnormality is seen.  IMPRESSION: Vascular congestion and mild interstitial edema. Increasing left basilar infiltrate. The right basilar atelectasis has improved slightly in the interval.   Electronically Signed   By: Alcide Clever M.D.   On: 07/10/2013 07:21   Dg Chest Port 1 View  07/09/2013   CLINICAL DATA:  Cough  EXAM: PORTABLE CHEST - 1 VIEW  COMPARISON:  07/09/2013 0025 hrs  FINDINGS: Cardiac shadow is stable. A right-sided central venous line is again noted. The tip projects over the mid right atrium and is stable in appearance. Retraction is again recommended. Right basilar atelectasis is noted. No pneumothorax is seen.  IMPRESSION: Increasing right basilar atelectasis.  Stable right jugular line.   Electronically Signed   By: Alcide Clever M.D.   On: 07/09/2013 07:30   Dg Chest Port 1 View  07/09/2013   CLINICAL DATA:  68 -year-old female central line placement. Initial encounter.  EXAM: PORTABLE CHEST - 1 VIEW  COMPARISON:  07/08/2013 and earlier.  FINDINGS: Portable AP semi upright view at 0025 hrs. Right IJ approach central line placed, tip about 8 cm below the carina. Projecting at the level of the right atrium.  No pneumothorax. Increased interstitial opacity. Stable cardiac size and mediastinal contours. Suggestion of small bilateral pleural effusions. Continued retrocardiac opacity.  IMPRESSION: 1. Right IJ central line placed, tip at the level of the right atrium. Retract 2 cm for cavoatrial junction level placement. 2. Increased interstitial opacity, favor vascular congestion. Suspect small pleural effusions. Lower lobe collapse or consolidation.   Electronically  Signed   By: Augusto Gamble M.D.   On: 07/09/2013 00:38    ASSESSMENT / PLAN:  PULMONARY A: Acute on chronic resp failure Severe O2 dependent COPD, on roflumilast chronically Pulmonary hypertension -  CAP P:   Supplemental O2 - goal SpO2 88-92% Scheduled BD's-- symbicort, roflumilast, duonebs Pulmonary hygiene  Follow CXR  abx as above  No indication systemic steroids   CARDIOVASCULAR A: Hypotension/shock - likely septic.  Resolved.    Sinus tachycardia Paroxysmal AF (Cardiologist: Herbie Baltimore) P:  KVO IVF with mild pulm edema  Monitor BP off pressors Continue to hold home verapamil and ARB Low dose PRN metoprolol to maintain HR < 120/min  RENAL A:  Acute renal insuff (baseline Cr 0.69) - resolved P:   Monitor BMET intermittently Monitor I/Os Correct electrolytes as indicated  GASTROINTESTINAL A:  Anorexia Chronic PPI use P:   SUP: PO pantoprazole Tolerating PO diet   HEMATOLOGIC A:  Microcytic anemia (chronic) without overt blood loss Chronic anticoagulation (for PAF and PAH) P:  DVT px: full anticoagulation with Xarelto Iron  Monitor CBC intermittently Transfuse per ICU guidelines  INFECTIOUS A:  CAP, NOS Severe leukocytosis - trending down  P:   Micro and abx as above PCT algorithm DC oseltamivir 2/8 with neg RVP   ENDOCRINE A:  DM 2 P:   SSI  ACHS lantus   NEUROLOGIC A: Hx of depression P:   Cont home meds Delirium prevention measures   Ok to transfer to tele from [facility] standpoint.     I have personally obtained a history, examined the patient, evaluated laboratory and imaging results, formulated the assessment and plan and placed orders.   Pt and husband updated.   Darlina Sicilian, NP 07/10/2013  9:21 AM Pager: [phone] or [phone]  *Care during the described time interval was provided by me and/or other providers on the critical care team. I have reviewed this patient's available data, including medical history, events of  note, physical examination and test results as part of my evaluation.  Baltazar Apo, MD, PhD 07/10/2013, 3:01 PM [facility] [phone] or if no answer [phone]

## 2013-07-11 DIAGNOSIS — E2749 Other adrenocortical insufficiency: Secondary | ICD-10-CM | POA: Diagnosis not present

## 2013-07-11 DIAGNOSIS — J189 Pneumonia, unspecified organism: Secondary | ICD-10-CM | POA: Diagnosis not present

## 2013-07-11 DIAGNOSIS — I10 Essential (primary) hypertension: Secondary | ICD-10-CM | POA: Diagnosis not present

## 2013-07-11 DIAGNOSIS — J449 Chronic obstructive pulmonary disease, unspecified: Secondary | ICD-10-CM | POA: Diagnosis not present

## 2013-07-11 DIAGNOSIS — E1149 Type 2 diabetes mellitus with other diabetic neurological complication: Secondary | ICD-10-CM | POA: Diagnosis not present

## 2013-07-11 LAB — CBC
HCT: 29.5 % — ABNORMAL LOW (ref 36.0–46.0)
Hemoglobin: 8.6 g/dL — ABNORMAL LOW (ref 12.0–15.0)
MCH: 21.3 pg — AB (ref 26.0–34.0)
MCHC: 29.2 g/dL — ABNORMAL LOW (ref 30.0–36.0)
MCV: 73 fL — AB (ref 78.0–100.0)
PLATELETS: 268 10*3/uL (ref 150–400)
RBC: 4.04 MIL/uL (ref 3.87–5.11)
RDW: 17.9 % — AB (ref 11.5–15.5)
WBC: 14.9 10*3/uL — ABNORMAL HIGH (ref 4.0–10.5)

## 2013-07-11 LAB — GLUCOSE, CAPILLARY
Glucose-Capillary: 129 mg/dL — ABNORMAL HIGH (ref 70–99)
Glucose-Capillary: 150 mg/dL — ABNORMAL HIGH (ref 70–99)
Glucose-Capillary: 120 mg/dL — ABNORMAL HIGH (ref 70–99)
Glucose-Capillary: 157 mg/dL — ABNORMAL HIGH (ref 70–99)

## 2013-07-11 LAB — BASIC METABOLIC PANEL
BUN: 9 mg/dL (ref 6–23)
CALCIUM: 8.8 mg/dL (ref 8.4–10.5)
CO2: 25 meq/L (ref 19–32)
CREATININE: 0.56 mg/dL (ref 0.50–1.10)
Chloride: 104 mEq/L (ref 96–112)
GFR calc Af Amer: >90 mL/min (ref >90)
GFR calc non Af Amer: >90 mL/min (ref >90)
GLUCOSE: 134 mg/dL — AB (ref 70–99)
Potassium: 4.5 mEq/L (ref 3.7–5.3)
Sodium: 140 mEq/L (ref 137–147)

## 2013-07-11 MED ORDER — VERAPAMIL HCL 40 MG PO TABS
40.0000 mg | ORAL_TABLET | Freq: Three times a day (TID) | ORAL | Status: DC
  Administered 2013-07-11 – 2013-07-12 (×3): 40 mg via ORAL
  Filled 2013-07-11 (×6): qty 1

## 2013-07-11 MED ORDER — AZITHROMYCIN 500 MG PO TABS
500.0000 mg | ORAL_TABLET | Freq: Every day | ORAL | Status: DC
  Administered 2013-07-11 – 2013-07-13 (×3): 500 mg via ORAL
  Filled 2013-07-11 (×3): qty 1

## 2013-07-11 NOTE — Progress Notes (Signed)
Pt stable, VSS.  Pt reported feeling SOB and "tired".  Pt received vicodin (5/325mg ) at 1024 for chronic back pain and acute headache.

## 2013-07-11 NOTE — Progress Notes (Signed)
I Destiny Shelton assessments and notes

## 2013-07-11 NOTE — Progress Notes (Signed)
Subjective: Feeling better.  Breathing is doing well.  No edema.  Objective: Vital signs in last 24 hours: Temp:  [97.5 F (36.4 C)-98.2 F (36.8 C)] 98.2 F (36.8 C) (02/09 1610) Pulse Rate:  [83-113] 110 (02/09 0638) Resp:  [19-24] 19 (02/09 0638) BP: (103-131)/(41-87) 124/87 mmHg (02/09 0638) SpO2:  [90 %-93 %] 92 % (02/09 9604) Weight:  [104.3 kg (229 lb 15 oz)-106.6 kg (235 lb 0.2 oz)] 106.6 kg (235 lb 0.2 oz) (02/09 5409) Weight change:  Last BM Date: 07/11/13  CBG (last 3)   Recent Labs  07/10/13 1651 07/10/13 2121 07/11/13 0557  GLUCAP 161* 115* 129*    Intake/Output from previous day: 02/08 0701 - 02/09 0700 In: 1593 [P.O.:960; I.V.:83; IV Piggyback:550] Out: 1350 [WJXBJ:4782] Intake/Output this shift: Total I/O In: 360 [P.O.:360] Out: 900 [Urine:900]  General appearance: alert and chronically ill, no acute distress; able to speak in sentences Eyes: no scleral icterus Throat: oropharynx moist without erythema Resp: clear to auscultation bilaterally Cardio: tachycardic, regular GI: soft, non-tender; bowel sounds normal; no masses,  no organomegaly Extremities: no clubbing, cyanosis or edema   Lab Results:  Recent Labs  07/10/13 0245 07/11/13 0547  NA 140 140  K 4.4 4.5  CL 103 104  CO2 27 25  GLUCOSE 134* 134*  BUN 8 9  CREATININE 0.68 0.56  CALCIUM 8.4 8.8  MG 2.5  --   PHOS 2.5  --     Recent Labs  07/08/13 1425  AST 17  ALT 11  ALKPHOS 74  BILITOT 0.7  PROT 7.7  ALBUMIN 3.3*    Recent Labs  07/09/13 0504 07/10/13 0245 07/11/13 0547  WBC 38.3* 19.6* 14.9*  NEUTROABS 32.9*  --   --   HGB 8.5* 8.2* 8.6*  HCT 29.0* 28.4* 29.5*  MCV 72.9* 74.0* 73.0*  PLT 242 231 268   Lab Results  Component Value Date   INR 1.21 05/26/2012   INR 0.96 12/19/2011   INR 1.0 09/06/2007    Recent Labs  07/08/13 1425  TROPONINI <0.30   No results found for this basename: TSH, T4TOTAL, FREET3, T3FREE, THYROIDAB,  in the last 72 hours No  results found for this basename: VITAMINB12, FOLATE, FERRITIN, TIBC, IRON, RETICCTPCT,  in the last 72 hours  Studies/Results: Dg Chest Port 1 View  07/10/2013   CLINICAL DATA:  Pneumonia  EXAM: PORTABLE CHEST - 1 VIEW  COMPARISON:  07/09/2013  FINDINGS: A right-sided central venous line is again identified in the mid atrium and stable. This could be withdrawn 1-2 cm. The lungs are well aerated bilaterally. Increased vascular congestion and interstitial changes are noted. Some increasing left basilar atelectasis is noted as well. No bony abnormality is seen.  IMPRESSION: Vascular congestion and mild interstitial edema. Increasing left basilar infiltrate. The right basilar atelectasis has improved slightly in the interval.   Electronically Signed   By: Inez Catalina M.D.   On: 07/10/2013 07:21     Medications: Scheduled: . atorvastatin  40 mg Oral QPC supper  . azithromycin  500 mg Intravenous Q24H  . budesonide-formoterol  2 puff Inhalation BID  . cefTRIAXone (ROCEPHIN)  IV  1 g Intravenous Q24H  . cyclobenzaprine  10 mg Oral QHS  . docusate sodium  100 mg Oral BID  . DULoxetine  60 mg Oral q morning - 10a  . gabapentin  600 mg Oral TID  . insulin aspart  0-15 Units Subcutaneous TID WC  . insulin aspart  4 Units Subcutaneous  TID WC  . insulin glargine  50 Units Subcutaneous Daily  . ipratropium  0.5 mg Nebulization TID  . iron polysaccharides  150 mg Oral Daily  . levalbuterol  0.63 mg Nebulization TID  . multivitamin with minerals  1 tablet Oral Daily  . omega-3 acid ethyl esters  1 capsule Oral TID  . pantoprazole  40 mg Oral Daily  . polyethylene glycol  17 g Oral BID  . Rivaroxaban  20 mg Oral Q supper  . roflumilast  500 mcg Oral Daily  . sodium chloride  3 mL Intravenous Q12H   Continuous: . sodium chloride 20 mL/hr (07/10/13 0923)    Assessment/Plan: Principal Problem: 1. Sepsis Syndrome-  Improving with resolution of hypotension, fever and improving leukocytosis.  Blood  cultures negative.  Will continue Rocephin and Azithro to cover respiratory sources.   2. Acute respiratory failure secondary to Right lower lobe pneumonia- improved.  Continue Rocephin/Aziothromycin.  On home O2 (4L).  Active Problems: 3. COPD (chronic obstructive pulmonary disease) with emphysema- continue Symbicort, Xopenex and Atrovent.  Continue Daliresp.  Approaching baseline. 4. Pulmonary hypertension-continue pulmonary toilet. 5. PAF (paroxysmal atrial fibrillation)/Tachycardia- will resume short-acting Verapamil today and transition to home dose if BP tolerates. 6. HYPERTENSION- hypotension resolved.  Will slowly resume home meds. 7. 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 8. CHF with preserved EF (EF 55-60%)- will resume Lasix at lower dose if remains stable as her pulmonary status is generally better when running dry though she was volume depleted on admission.  Resume ARB as tolerated by BP. 9. Disposition- possible discharge in 1-2 days if stable with medication adjustments.  Continue PT/OT.   LOS: 3 days   Destiny Shelton,W DOUGLAS 07/11/2013, 9:44 AM

## 2013-07-11 NOTE — Progress Notes (Signed)
Patient stated she felt short of breath. Oxygen saturations taken and result stayed between 93-95% on 4 liters of oxygen. Patient stated she may have some anxiety but not sure when asked by nurse. Nurse suggested Xanax and a breathing treatment to help relax patient and patient agreed. Breathing treatment and xanax given per PRN order. Also during this time, noticed patient experiencing a lot of coughing. Suggested PRN Guafenisin cough syrup and patient agreed. Administered cough syrup per PRN order. Will continue to monitor patient to end of shift.

## 2013-07-11 NOTE — Evaluation (Signed)
Occupational Therapy Evaluation Patient Details Name: Destiny Shelton MRN: [id number] DOB: [birth year] Today's Date: 07/11/2013 Time: 8469-6295 OT Time Calculation (min): 18 min  OT Assessment / Plan / Recommendation History of present illness 68 year old white female with a history of oxygen dependent COPD, A. fib with RVR, and pulmonary hypertension.  Presented to PCP with a complaint of increased shortness of breath, and muscle aches. She states that she was in her usual state of health (chronically poor but recently stable) until she developed leg stiffness about 2 days PTA. CAP, hypotension, sepsis, leukocytosis, AKI.   Clinical Impression   Pt is at sup - min guard A level; with ADL mobility and LB ADLs in standing, sit - stand due to decreased balance. Pt abel to perform ADLs and toileting without SOB with O2 SATs staying >90%. NO further acute OT services indicated at this time, pt should continue with acute PT services for functional mobility safety    OT Assessment  Patient does not need any further OT services    Follow Up Recommendations  No OT follow up    Barriers to Discharge  none    Equipment Recommendations  None recommended by OT    Recommendations for Other Services    Frequency       Precautions / Restrictions Precautions Precautions: Fall Restrictions Weight Bearing Restrictions: No   Pertinent Vitals/Pain No c/o pain    ADL  Grooming: Performed;Wash/dry hands;Wash/dry face;Supervision/safety Where Assessed - Grooming: Unsupported standing Upper Body Bathing: Simulated;Set up Where Assessed - Upper Body Bathing: Unsupported sitting Lower Body Bathing: Simulated;Supervision/safety;Set up;Min guard Where Assessed - Lower Body Bathing: Unsupported sitting;Supported sit to stand Upper Body Dressing: Performed;Set up Lower Body Dressing: Performed;Supervision/safety;Set up;Min guard Where Assessed - Lower Body Dressing: Unsupported sitting;Supported sit to  stand Toilet Transfer: Performed;Supervision/safety Science writer: Regular height toilet;Grab bars Toileting - Clothing Manipulation and Hygiene: Performed;Supervision/safety Where Assessed - Best boy and Hygiene: Standing Tub/Shower Transfer: Simulated;Performed;Supervision/safety;Min guard Tub/Shower Transfer Method: Ambulating (simulated for tub shower) Tub/Shower Transfer Equipment: Grab bars;Walk in shower;Other (comment) Equipment Used: Gait belt ADL Comments: Pt requires min guard A for ADLs during stading, sit - stand due to being unsteady    OT Diagnosis:    OT Problem List:   OT Treatment Interventions:     OT Goals(Current goals can be found in the care plan section) Acute Rehab OT Goals Patient Stated Goal: get around better OT Goal Formulation: With patient  Visit Information  Last OT Received On: 07/11/13 History of Present Illness: 68 year old white female with a history of oxygen dependent COPD, A. fib with RVR, and pulmonary hypertension.  Presented to PCP with a complaint of increased shortness of breath, and muscle aches. She states that she was in her usual state of health (chronically poor but recently stable) until she developed leg stiffness about 2 days PTA. CAP, hypotension, sepsis, leukocytosis, AKI.       Prior [place] expects to be discharged to:: Private residence Living Arrangements: Spouse/significant other Available Help at Discharge: Family;Available 24 hours/day Type of Home: House Home Access: Level entry Home Layout: One level Home Equipment: Walker - 4 wheels;Cane - single point;Shower seat;Other (comment) (4 L O2 at home) Additional Comments: 4 L O2 at home Prior Function Level of Independence: Independent with assistive device(s);Needs assistance ADL's / Homemaking Assistance Needed: bathing supervision occassionally; housework Communication Communication: No  difficulties Dominant Hand: Right         Vision/Perception  Vision - History Baseline Vision: Wears glasses all the time Patient Visual Report: No change from baseline Perception Perception: Within Functional Limits   Cognition  Cognition Arousal/Alertness: Awake/alert Behavior During Therapy: WFL for tasks assessed/performed Overall Cognitive Status: Within Functional Limits for tasks assessed    Extremity/Trunk Assessment Upper Extremity Assessment Upper Extremity Assessment: Overall WFL for tasks assessed Lower Extremity Assessment Lower Extremity Assessment: Defer to PT evaluation Cervical / Trunk Assessment Cervical / Trunk Assessment: Normal     Mobility Bed Mobility Overal bed mobility: Modified Independent Transfers Overall transfer level: Needs assistance Equipment used: 1 person hand held assist Transfers: Sit to/from Stand Sit to Stand: Supervision          Balance Balance Overall balance assessment: Needs assistance Sitting-balance support: No upper extremity supported;Feet supported Sitting balance-Leahy Scale: Good Standing balance support: Single extremity supported;No upper extremity supported;During functional activity Standing balance comment: unsteady when standing for dynamic balance tasks, LOB x 1 but was able to self correct   End of Session OT - End of Session Equipment Utilized During Treatment: Gait belt Activity Tolerance: Patient tolerated treatment well Patient left: in bed;with call bell/phone within reach;with family/visitor present;Other (comment) (sitting EOB)  GO     Britt Bottom 07/11/2013, 12:54 PM

## 2013-07-12 DIAGNOSIS — J449 Chronic obstructive pulmonary disease, unspecified: Secondary | ICD-10-CM | POA: Diagnosis not present

## 2013-07-12 DIAGNOSIS — I10 Essential (primary) hypertension: Secondary | ICD-10-CM | POA: Diagnosis not present

## 2013-07-12 DIAGNOSIS — E2749 Other adrenocortical insufficiency: Secondary | ICD-10-CM | POA: Diagnosis not present

## 2013-07-12 DIAGNOSIS — E1149 Type 2 diabetes mellitus with other diabetic neurological complication: Secondary | ICD-10-CM | POA: Diagnosis not present

## 2013-07-12 DIAGNOSIS — J189 Pneumonia, unspecified organism: Secondary | ICD-10-CM | POA: Diagnosis not present

## 2013-07-12 LAB — BASIC METABOLIC PANEL
BUN: 8 mg/dL (ref 6–23)
BUN: 7 mg/dL (ref 6–23)
CHLORIDE: 109 meq/L (ref 96–112)
CO2: 16 mEq/L — ABNORMAL LOW (ref 19–32)
CO2: 25 meq/L (ref 19–32)
CREATININE: 0.24 mg/dL — AB (ref 0.50–1.10)
Calcium: 8.7 mg/dL (ref 8.4–10.5)
Calcium: 8.9 mg/dL (ref 8.4–10.5)
Chloride: 109 mEq/L (ref 96–112)
Creatinine, Ser: 0.54 mg/dL (ref 0.50–1.10)
GFR calc Af Amer: >90 mL/min (ref >90)
GFR calc non Af Amer: >90 mL/min (ref >90)
GFR calc non Af Amer: >90 mL/min (ref >90)
Glucose, Bld: 104 mg/dL — ABNORMAL HIGH (ref 70–99)
Glucose, Bld: 125 mg/dL — ABNORMAL HIGH (ref 70–99)
POTASSIUM: 3.9 meq/L (ref 3.7–5.3)
Potassium: 4.6 mEq/L (ref 3.7–5.3)
SODIUM: 146 meq/L (ref 137–147)
Sodium: 138 mEq/L (ref 137–147)

## 2013-07-12 LAB — CBC
HEMATOCRIT: 29.1 % — AB (ref 36.0–46.0)
HEMOGLOBIN: 8.5 g/dL — AB (ref 12.0–15.0)
MCH: 20.9 pg — ABNORMAL LOW (ref 26.0–34.0)
MCHC: 29.2 g/dL — AB (ref 30.0–36.0)
MCV: 71.5 fL — ABNORMAL LOW (ref 78.0–100.0)
Platelets: 266 10*3/uL (ref 150–400)
RBC: 4.07 MIL/uL (ref 3.87–5.11)
RDW: 17.8 % — ABNORMAL HIGH (ref 11.5–15.5)
WBC: 10.9 10*3/uL — AB (ref 4.0–10.5)

## 2013-07-12 LAB — GLUCOSE, CAPILLARY
GLUCOSE-CAPILLARY: 138 mg/dL — AB (ref 70–99)
GLUCOSE-CAPILLARY: 193 mg/dL — AB (ref 70–99)
GLUCOSE-CAPILLARY: 138 mg/dL — AB (ref 70–99)
Glucose-Capillary: 139 mg/dL — ABNORMAL HIGH (ref 70–99)

## 2013-07-12 LAB — LACTIC ACID, PLASMA: Lactic Acid, Venous: 0.7 mmol/L (ref 0.5–2.2)

## 2013-07-12 MED ORDER — VERAPAMIL HCL ER 180 MG PO TBCR
360.0000 mg | EXTENDED_RELEASE_TABLET | Freq: Every day | ORAL | Status: DC
  Administered 2013-07-12 – 2013-07-13 (×2): 360 mg via ORAL
  Filled 2013-07-12 (×2): qty 2

## 2013-07-12 MED ORDER — TRAMADOL HCL 50 MG PO TABS: 50.0000 mg | ORAL_TABLET | Freq: Four times a day (QID) | ORAL | Status: DC | PRN

## 2013-07-12 MED ORDER — FUROSEMIDE 40 MG PO TABS
40.0000 mg | ORAL_TABLET | Freq: Two times a day (BID) | ORAL | Status: DC
  Administered 2013-07-12 – 2013-07-13 (×3): 40 mg via ORAL
  Filled 2013-07-12 (×5): qty 1

## 2013-07-12 NOTE — Progress Notes (Signed)
Physical Therapy Treatment Patient Details Name: Destiny Shelton MRN: [id number] DOB: [birth year] Today's Date: 07/12/2013 Time: 1950-9326 PT Time Calculation (min): 25 min  PT Assessment / Plan / Recommendation  History of Present Illness 68 year old white female with a history of oxygen dependent COPD, A. fib with RVR, and pulmonary hypertension.  Presented to PCP with a complaint of increased shortness of breath, and muscle aches. She states that she was in her usual state of health (chronically poor but recently stable) until she developed leg stiffness about 2 days PTA. CAP, hypotension, sepsis, leukocytosis, AKI.   PT Comments   Pt de-sats quickly with activity on 4 L/min.  Went to 77% on 4L/min with 14 feet gait with RW.  O2 went back to 90% within 2 minutes.  Husband reports she de-sats at home with 4L but normally to mid-upper 80s.  Follow Up Recommendations  Home health PT;Supervision/Assistance - 24 hour     Does the patient have the potential to tolerate intense rehabilitation     Barriers to Discharge        Equipment Recommendations  3in1 (PT)    Recommendations for Other Services    Frequency Min 3X/week   Progress towards PT Goals Progress towards PT goals: Progressing toward goals  Plan Current plan remains appropriate    Precautions / Restrictions Precautions Precautions: Fall Restrictions Weight Bearing Restrictions: No   Pertinent Vitals/Pain 77% with gait on 4L/min    Mobility  Bed Mobility Overal bed mobility: Modified Independent (HOB elevated) Transfers Overall transfer level: Needs assistance Transfers: Sit to/from Stand Sit to Stand: Supervision General transfer comment:  (cueing for hand placement) Ambulation/Gait Ambulation/Gait assistance: Min guard Ambulation Distance (Feet): 14 Feet Assistive device: Rolling walker (2 wheeled) Gait Pattern/deviations: Decreased step length - right;Decreased step length - left Gait velocity:  decreased General Gait Details: ueing for proper breathing technique.  de-sat to 77% with gait on 4 L/min,  2 mins to recover to 90%.    Exercises General Exercises - Lower Extremity Ankle Circles/Pumps: 5 reps;Seated;Both Long Arc Quad: Strengthening;Both;10 reps Hip Flexion/Marching: Strengthening;Both;10 reps Other Exercises Other Exercises: Pt instructed to perform LE exercises throughout the day Other Exercises: Pursed lip breathing training in sitting and standing with gait.   PT Diagnosis:    PT Problem List:   PT Treatment Interventions:     PT Goals (current goals can now be found in the care plan section) Acute Rehab PT Goals Patient Stated Goal: get around better Time For Goal Achievement: 07/24/13 Potential to Achieve Goals: Good  Visit Information  Last PT Received On: 07/12/13 Assistance Needed: +1 History of Present Illness: 68 year old white female with a history of oxygen dependent COPD, A. fib with RVR, and pulmonary hypertension.  Presented to PCP with a complaint of increased shortness of breath, and muscle aches. She states that she was in her usual state of health (chronically poor but recently stable) until she developed leg stiffness about 2 days PTA. CAP, hypotension, sepsis, leukocytosis, AKI.    Subjective Data  Patient Stated Goal: get around better   Cognition  Cognition Arousal/Alertness: Awake/alert Behavior During Therapy: WFL for tasks assessed/performed Overall Cognitive Status: Within Functional Limits for tasks assessed    Balance     End of Session PT - End of Session Equipment Utilized During Treatment: Gait belt;Oxygen Activity Tolerance: Patient limited by fatigue (decreased O2 sats with gait) Patient left: in chair;with call bell/phone within reach;with family/visitor present Nurse Communication: Mobility status;Other (comment) (o2 sats)  GP     Mackie Holness LUBECK 07/12/2013, 10:12 AM

## 2013-07-12 NOTE — Progress Notes (Signed)
Subjective: SLight increase in shortness of breath last night.  Headache overnight.  Tylenol not helpful.  Feels a little better this am.  Objective: Vital signs in last 24 hours: Temp:  [97.9 F (36.6 C)-98 F (36.7 C)] 98 F (36.7 C) (02/09 2026) Pulse Rate:  [112-115] 115 (02/09 2026) Resp:  [20] 20 (02/09 2026) BP: (118-131)/(52-55) 131/52 mmHg (02/09 2026) SpO2:  [92 %-93 %] 92 % (02/09 2026) Weight change:  Last BM Date: 07/11/13  CBG (last 3)   Recent Labs  07/11/13 1631 07/11/13 2121 07/12/13 0654  GLUCAP 120* 157* 138*    Intake/Output from previous day: 02/09 0701 - 02/10 0700 In: 1086 [P.O.:1080; I.V.:6] Out: 1400 [Urine:1400] Intake/Output this shift:    General appearance: fatigued and mild tachypnea Eyes: no scleral icterus Throat: oropharynx dry Resp: right basilar crackles Cardio: tachycardic, regular GI: soft, non-tender; bowel sounds normal; no masses,  no organomegaly Extremities: no clubbing, cyanosis; trace edema bilateral lower extremities   Lab Results:  Recent Labs  07/10/13 0245 07/11/13 0547 07/12/13 0345  NA 140 140 138  K 4.4 4.5 4.6  CL 103 104 109  CO2 27 25 16*  GLUCOSE 134* 134* 104*  BUN 8 9 8   CREATININE 0.68 0.56 0.24*  CALCIUM 8.4 8.8 8.7  MG 2.5  --   --   PHOS 2.5  --   --    No results found for this basename: AST, ALT, ALKPHOS, BILITOT, PROT, ALBUMIN,  in the last 72 hours  Recent Labs  07/10/13 0245 07/11/13 0547  WBC 19.6* 14.9*  HGB 8.2* 8.6*  HCT 28.4* 29.5*  MCV 74.0* 73.0*  PLT 231 268   Lab Results  Component Value Date   INR 1.21 05/26/2012   INR 0.96 12/19/2011   INR 1.0 09/06/2007   No results found for this basename: CKTOTAL, CKMB, CKMBINDEX, TROPONINI,  in the last 72 hours No results found for this basename: TSH, T4TOTAL, FREET3, T3FREE, THYROIDAB,  in the last 72 hours No results found for this basename: VITAMINB12, FOLATE, FERRITIN, TIBC, IRON, RETICCTPCT,  in the last 72  hours  Studies/Results: No results found.   Medications: Scheduled: . atorvastatin  40 mg Oral QPC supper  . azithromycin  500 mg Oral Q1200  . budesonide-formoterol  2 puff Inhalation BID  . cefTRIAXone (ROCEPHIN)  IV  1 g Intravenous Q24H  . cyclobenzaprine  10 mg Oral QHS  . docusate sodium  100 mg Oral BID  . DULoxetine  60 mg Oral q morning - 10a  . gabapentin  600 mg Oral TID  . insulin aspart  0-15 Units Subcutaneous TID WC  . insulin aspart  4 Units Subcutaneous TID WC  . insulin glargine  50 Units Subcutaneous Daily  . ipratropium  0.5 mg Nebulization TID  . iron polysaccharides  150 mg Oral Daily  . levalbuterol  0.63 mg Nebulization TID  . multivitamin with minerals  1 tablet Oral Daily  . omega-3 acid ethyl esters  1 capsule Oral TID  . pantoprazole  40 mg Oral Daily  . polyethylene glycol  17 g Oral BID  . Rivaroxaban  20 mg Oral Q supper  . roflumilast  500 mcg Oral Daily  . sodium chloride  3 mL Intravenous Q12H  . verapamil  40 mg Oral Q8H   Continuous: . sodium chloride 20 mL/hr (07/10/13 0923)    Assessment/Plan: Principal Problem:  1. Sepsis Syndrome- Improving with resolution of hypotension, fever and improving leukocytosis. Blood cultures  remain negative. Will continue Rocephin and Azithro to cover respiratory sources.  Obtain CBC. 2. Acute respiratory failure secondary to Right lower lobe pneumonia- Slight increase in tachypnea.  Will resume low dose lasix due to mild volume overload. Continue Rocephin/Aziothromycin. On home O2 (4L).  Active Problems:  3. COPD (chronic obstructive pulmonary disease) with emphysema- continue Symbicort, Xopenex and Atrovent. Continue Daliresp.  4. Pulmonary hypertension-continue pulmonary toilet.  5. PAF (paroxysmal atrial fibrillation)/Tachycardia- transition back to home dose of Verapamil. 6. HYPERTENSION- hypotension resolved. Will restart Lasix and resume Diovan soon if BP stable.    7. DM2 with neurologic  complications/DIABETIC PERIPHERAL NEUROPATHY- continue Lantus/SSI.  8. CHF with preserved EF (EF 55-60%)- will resume Lasix at lower dose (40mg  BID) as her pulmonary status is generally better when running dry though she was volume depleted on admission. Resume ARB as tolerated by BP.  9.  Metabolic Acidosis- repeat BMET and check lactic acid.  10. Headache- history most consistent with tension headache.  Add tramadol as needed. 11. Disposition- possible discharge in 1-2 days if breathing stable. Continue PT/OT.  OOB to chair.   LOS: 4 days   Destiny Shelton,W DOUGLAS 07/12/2013, 7:31 AM

## 2013-07-12 NOTE — Progress Notes (Signed)
Before leaving patient's room, patient stated she had a slight headache. Pain level 4 out of 10. Patient refused to have any pain medication and stated she wanted to just relax to see if it would go away. Will continue to monitor patient to end of shift.

## 2013-07-12 NOTE — Care Management Note (Addendum)
  Page 1 of 1   07/12/2013     5:27:37 PM   CARE MANAGEMENT NOTE 07/12/2013  Patient:  Destiny Shelton, Destiny Shelton   Account Number:  [id number]  Date Initiated:  07/12/2013  Documentation initiated by:  Karol Liendo  Subjective/Objective Assessment:   Admitted with sepsis     Action/Plan:   will follow for dispositon needs   Anticipated DC Date:  07/12/2013   Anticipated DC Plan:  HOME/SELF CARE         Choice offered to / List presented to:             Status of service:  In process, will continue to follow Medicare Important Message given?   (If response is "NO", the following Medicare IM given date fields will be blank) Date Medicare IM given:   Date Additional Medicare IM given:    Discharge Disposition:    Per UR Regulation:  Reviewed for med. necessity/level of care/duration of stay  If discussed at [place] of Stay Meetings, dates discussed:    Comments:  07/12/2013 Hx/o active with home oxygen. PT RECS:  Home health PT;Supervision/Assistance - 24 hour DME RECS:  3in1 (PT) Provider:  Pending Disposition Plan:  Home with HHS:  PT, RN Mariann Laster RN, BSN, [place], CCM  07/12/2013

## 2013-07-12 NOTE — Progress Notes (Signed)
After breathing treatment given, oxygen saturations at 95% on 4 liters of oxygen. Patient now states she feels like she is getting more are. Encouraged patient to also continue to give time to let the xanax work as well and patient understood. Will continue to monitor patient to end of shift.

## 2013-07-13 DIAGNOSIS — I10 Essential (primary) hypertension: Secondary | ICD-10-CM | POA: Diagnosis not present

## 2013-07-13 DIAGNOSIS — J449 Chronic obstructive pulmonary disease, unspecified: Secondary | ICD-10-CM | POA: Diagnosis not present

## 2013-07-13 DIAGNOSIS — J189 Pneumonia, unspecified organism: Secondary | ICD-10-CM | POA: Diagnosis not present

## 2013-07-13 DIAGNOSIS — E1149 Type 2 diabetes mellitus with other diabetic neurological complication: Secondary | ICD-10-CM | POA: Diagnosis not present

## 2013-07-13 DIAGNOSIS — E2749 Other adrenocortical insufficiency: Secondary | ICD-10-CM | POA: Diagnosis not present

## 2013-07-13 LAB — GLUCOSE, CAPILLARY: Glucose-Capillary: 127 mg/dL — ABNORMAL HIGH (ref 70–99)

## 2013-07-13 LAB — CBC
HEMATOCRIT: 28.7 % — AB (ref 36.0–46.0)
HEMOGLOBIN: 8.4 g/dL — AB (ref 12.0–15.0)
MCH: 21.1 pg — ABNORMAL LOW (ref 26.0–34.0)
MCHC: 29.3 g/dL — ABNORMAL LOW (ref 30.0–36.0)
MCV: 72.1 fL — ABNORMAL LOW (ref 78.0–100.0)
Platelets: 275 10*3/uL (ref 150–400)
RBC: 3.98 MIL/uL (ref 3.87–5.11)
RDW: 18 % — ABNORMAL HIGH (ref 11.5–15.5)
WBC: 10.2 10*3/uL (ref 4.0–10.5)

## 2013-07-13 LAB — BASIC METABOLIC PANEL
BUN: 9 mg/dL (ref 6–23)
CHLORIDE: 109 meq/L (ref 96–112)
CO2: 22 mEq/L (ref 19–32)
Calcium: 8.8 mg/dL (ref 8.4–10.5)
Creatinine, Ser: 0.64 mg/dL (ref 0.50–1.10)
GFR, EST NON AFRICAN AMERICAN: 90 mL/min — AB (ref >90)
Glucose, Bld: 117 mg/dL — ABNORMAL HIGH (ref 70–99)
POTASSIUM: 3.8 meq/L (ref 3.7–5.3)
Sodium: 145 mEq/L (ref 137–147)

## 2013-07-13 MED ORDER — BUDESONIDE-FORMOTEROL FUMARATE 160-4.5 MCG/ACT IN AERO: 2.0000 | INHALATION_SPRAY | Freq: Two times a day (BID) | RESPIRATORY_TRACT | Status: DC

## 2013-07-13 MED ORDER — FUROSEMIDE 40 MG PO TABS: 40.0000 mg | ORAL_TABLET | Freq: Two times a day (BID) | ORAL | Status: DC

## 2013-07-13 MED ORDER — LEVOFLOXACIN 500 MG PO TABS: 500.0000 mg | ORAL_TABLET | Freq: Every day | ORAL | Status: AC

## 2013-07-13 NOTE — Discharge Summary (Signed)
DISCHARGE SUMMARY  Destiny Shelton  MR#: [id number]  DOB:1946-01-10  Date of Admission: 07/08/2013 Date of Discharge: 07/13/2013  Attending 25 DOUGLAS  Patient's [id number] DOUGLAS, MD  Consults:  Pulmonary/Critical Care Medicine  Discharge Diagnoses: Principal Problems:    Septic shock    Acute respiratory failure    Community acquired pneumonia Active Problems:   COPD (chronic obstructive pulmonary disease) with emphysema   Diabetes mellitus type 2 with neurological manifestations   HYPERTENSION   Pulmonary hypertension   PAF (paroxysmal atrial fibrillation)   Chronic anemia   Acute renal insufficiency    Past Medical History DM2, insulin dependant with neuropathy, nephropathy  HTN  Hyperlipidemia  COPD on home O2- followed by Dr. Gwenette Greet  Pulmonary hypertension (RV systolic pressure of 45 mmHg)  Afib with RVR-->heart failure, acute respiratory failure (12/13)  ANXIETY DEPRESSION  Dx of CARCINOMA, SKIN, SQUAMOUS CELL, FACE --left 6/05  CAROTID STENOSIS--< 50% (B) (5/10)  CARPAL TUNNEL SYNDROME, BILATERAL --S/P repair (?(R))  DIVERTICULAR DISEASE  GERD-S/P esophageal dilation (11/07)  NEPHROLITHIASIS  OSTEOPOROSIS  POLYP, COLON, RECURRENT--11/07, 11/08, 8/09, 7/14  Past Surgical History RIGHT TKR (1997)  [place] TACK   Discharge Medications:   Medication List    STOP taking these medications       irbesartan 150 MG tablet  Commonly known as:  AVAPRO      TAKE these medications       acetaminophen 500 MG tablet  Commonly known as:  TYLENOL  Take 1,000 mg by mouth every 6 (six) hours as needed for mild pain.     ALPRAZolam 0.5 MG tablet  Commonly known as:  XANAX  Take 0.5-1 mg by mouth at bedtime as needed. For anxiety     AMBULATORY NON FORMULARY MEDICATION  Continuous O2 @@ 4LMP     atorvastatin 40 MG tablet  Commonly known as:  LIPITOR  Take 1 tablet by mouth daily.     benzonatate 200 MG  capsule  Commonly known as:  TESSALON  Take 400 mg by mouth at bedtime. For cough     budesonide-formoterol 160-4.5 MCG/ACT inhaler  Commonly known as:  SYMBICORT  Inhale 2 puffs into the lungs 2 (two) times daily as needed (shortness of breath).     budesonide-formoterol 160-4.5 MCG/ACT inhaler  Commonly known as:  SYMBICORT  Inhale 2 puffs into the lungs 2 (two) times daily.     CALCIUM-VITAMIN D PO  Take 1 tablet by mouth 2 (two) times daily.     cyclobenzaprine 10 MG tablet  Commonly known as:  FLEXERIL  Take 10 mg by mouth at bedtime.     DALIRESP 500 MCG Tabs tablet  Generic drug:  roflumilast  Take 500 mcg by mouth daily.     docusate sodium 100 MG capsule  Commonly known as:  COLACE  Take 100 mg by mouth 2 (two) times daily.     DULoxetine 60 MG capsule  Commonly known as:  CYMBALTA  Take 60 mg by mouth every morning.     Fish Oil 1000 MG Caps  Take 1 capsule by mouth 3 (three) times daily.     furosemide 40 MG tablet  Commonly known as:  LASIX  Take 1 tablet (40 mg total) by mouth 2 (two) times daily.     gabapentin 600 MG tablet  Commonly known as:  NEURONTIN  Take 600 mg by mouth 3 (three) times daily.     HYDROcodone-acetaminophen 5-325 MG per  tablet  Commonly known as:  NORCO/VICODIN  Take 1 tablet by mouth every 8 (eight) hours as needed for pain.     insulin glargine 100 UNIT/ML injection  Commonly known as:  LANTUS  Inject 70 Units into the skin daily before breakfast.     iron polysaccharides 150 MG capsule  Commonly known as:  NIFEREX  Take 1 capsule (150 mg total) by mouth daily.     levalbuterol 45 MCG/ACT inhaler  Commonly known as:  XOPENEX HFA  Inhale 1-2 puffs into the lungs every 4 (four) hours as needed for wheezing.     levofloxacin 500 MG tablet  Commonly known as:  LEVAQUIN  Take 1 tablet (500 mg total) by mouth daily.     multivitamin tablet  Take 1 tablet by mouth daily.     NOVOLOG 100 UNIT/ML injection  Generic drug:   insulin aspart  Inject 10 Units into the skin 2 (two) times daily.     omeprazole 20 MG capsule  Commonly known as:  PRILOSEC  Take 20 mg by mouth at bedtime.     polyethylene glycol packet  Commonly known as:  MIRALAX / GLYCOLAX  Take 17 g by mouth daily as needed for mild constipation.     potassium chloride 10 MEQ CR tablet  Commonly known as:  KLOR-CON  Take 10 mEq by mouth 3 (three) times daily.     pravastatin 40 MG tablet  Commonly known as:  PRAVACHOL  Take 40 mg by mouth daily.     Rivaroxaban 20 MG Tabs tablet  Commonly known as:  XARELTO  Take 1 tablet (20 mg total) by mouth daily with supper. Restart on Friday 12/24/2012     SYSTANE OP  Place 1 drop into both eyes every morning.     tiotropium 18 MCG inhalation capsule  Commonly known as:  SPIRIVA  Place 18 mcg into inhaler and inhale daily.     valsartan 160 MG tablet  Commonly known as:  DIOVAN  Take 80 mg by mouth daily.     verapamil 360 MG 24 hr capsule  Commonly known as:  VERELAN PM  Take 1 capsule (360 mg total) by mouth daily.        Hospital Procedures: Dg Chest 2 View  07/08/2013   CLINICAL DATA:  Cough, shortness of breath evaluate for pneumonitis  EXAM: CHEST  2 VIEW  COMPARISON:  DG CHEST 1V PORT dated 05/29/2012  FINDINGS: Low lung volumes. Cardiac silhouette is enlarged. Area of increased density projects in the left lung base. There is blunting of the left costophrenic angle. Prominence of interstitial markings is appreciated without definite peribronchial cuffing. The osseous structures unremarkable.  IMPRESSION: 1. Atelectasis versus infiltrate left lung base. 2. Interstitial findings which may reflect underlying interstitial pneumonitis or alternatively a component of underlying chronic interstitial fibrosis. There does not appear to be significant peribronchial cuffing appreciated and a component of very mild pulmonary edema is also a diagnostic consideration.   Electronically Signed   By:  Margaree Mackintosh M.D.   On: 07/08/2013 15:28   Dg Chest Port 1 View  07/10/2013   CLINICAL DATA:  Pneumonia  EXAM: PORTABLE CHEST - 1 VIEW  COMPARISON:  07/09/2013  FINDINGS: A right-sided central venous line is again identified in the mid atrium and stable. This could be withdrawn 1-2 cm. The lungs are well aerated bilaterally. Increased vascular congestion and interstitial changes are noted. Some increasing left basilar atelectasis is noted as well. No  bony abnormality is seen.  IMPRESSION: Vascular congestion and mild interstitial edema. Increasing left basilar infiltrate. The right basilar atelectasis has improved slightly in the interval.   Electronically Signed   By: Inez Catalina M.D.   On: 07/10/2013 07:21   Dg Chest Port 1 View  07/09/2013   CLINICAL DATA:  Cough  EXAM: PORTABLE CHEST - 1 VIEW  COMPARISON:  07/09/2013 0025 hrs  FINDINGS: Cardiac shadow is stable. A right-sided central venous line is again noted. The tip projects over the mid right atrium and is stable in appearance. Retraction is again recommended. Right basilar atelectasis is noted. No pneumothorax is seen.  IMPRESSION: Increasing right basilar atelectasis.  Stable right jugular line.   Electronically Signed   By: Inez Catalina M.D.   On: 07/09/2013 07:30   Dg Chest Port 1 View  07/09/2013   CLINICAL DATA:  28 -year-old female central line placement. Initial encounter.  EXAM: PORTABLE CHEST - 1 VIEW  COMPARISON:  07/08/2013 and earlier.  FINDINGS: Portable AP semi upright view at 0025 hrs. Right IJ approach central line placed, tip about 8 cm below the carina. Projecting at the level of the right atrium.  No pneumothorax. Increased interstitial opacity. Stable cardiac size and mediastinal contours. Suggestion of small bilateral pleural effusions. Continued retrocardiac opacity.  IMPRESSION: 1. Right IJ central line placed, tip at the level of the right atrium. Retract 2 cm for cavoatrial junction level placement. 2. Increased  interstitial opacity, favor vascular congestion. Suspect small pleural effusions. Lower lobe collapse or consolidation.   Electronically Signed   By: Lars Pinks M.D.   On: 07/09/2013 00:38   Insertion of Central Venous Catheter (07/09/13)  History of Present Illness: Destiny Shelton is 68 year old white female with a history of oxygen dependent COPD, A. fib with RVR, and pulmonary hypertension. He presented to the office today with a complaint of increased shortness of breath, and muscle aches. She states that she was in her usual state of health (chronically poor but recently stable) until she developed leg stiffness about 2 days ago. Last night developed chills, increased myalgias (entire body), fever (no thermometer), worsening shortness of breath and cough. Thick yellow sputum. Minimal chest pain, some upper back pain and left shoulder pain. On O2 4 liters at baseline. Compliant with inhalers. Has taken 3 Lasix twice a day for the past month.  In the office her oxygen saturation saturations are 80%. Chest x-ray shows probable right lower lobe infiltrate. Influenza swab is negative. She is being admitted for further management.   Hospital Course: Due to limited bed availability the patient was admitted through the emergency department. Laboratory evaluation in the emergency department showed a white blood count of 38.3, BNP 316, troponin less than 0.3, BUN and creatinine 11 and 0.72 respectively. Urinalysis was negative. Chest x-ray showed atelectasis versus infiltrate left lung base. She was started on antibiotics for community-acquired pneumonia with Rocephin/azithromycin. Tamiflu was started empirically pending influenza nasal swab which subsequently returned as negative so Tamiflu was discontinued. While in the emergency department her condition continued to decline with worsening hypotension and tachycardia. Pulmonary critical care was consulted and the patient was admitted to the ICU for management of  septic shock. A central line was placed and the patient was volume resuscitated for septic shock with low CVP consistent with a component of volume depletion. With broad-spectrum antibiotics and hydration her hypotension resolved and her pulmonary status improved. She was transferred to the floor on 2/8. IV antibiotics for  community acquired pneumonia were continued and her IV fluids were discontinued.  She continued to have tachycardia so her verapamil was resumed as her blood pressure improved. She was also restarted on a lower dose of Lasix which she tolerated well without hypotension. At this point, the patient's condition has improved significantly. Blood cultures have returned as negative to date. Her leukocytosis has improved with white blood count of 10.2 on the day of discharge. She is back to her home oxygen dose of 4 L. Her breathing is approaching baseline with persistent dry cough. She has been evaluated by physical therapy recommends home health physical therapy and 24-hour assistance which can be provided by her husband. She is stable for discharge home  Day of Discharge Exam BP 136/58  Pulse 108  Temp(Src) 98 F (36.7 C) (Oral)  Resp 20  Ht 5\' 5"  (1.651 m)  Wt 102.8 kg (226 lb 10.1 oz)  BMI 37.71 kg/m2  SpO2 92%  Physical Exam: General appearance: alert and Chronically ill in no acute distress, breathing comfortable with her Eyes: no scleral icterus Throat: oropharynx moist without erythema Resp: Minimal left basilar crackles Cardio: Tachycardic without murmurs rubs or gallops GI: soft, non-tender; bowel sounds normal; no masses,  no organomegaly Extremities: no clubbing, cyanosis or edema  Discharge Labs:  Recent Labs  07/12/13 0808 07/13/13 0443  NA 146 145  K 3.9 3.8  CL 109 109  CO2 25 22  GLUCOSE 125* 117*  BUN 7 9  CREATININE 0.54 0.64  CALCIUM 8.9 8.8   No results found for this basename: AST, ALT, ALKPHOS, BILITOT, PROT, ALBUMIN,  in the last 72  hours  Recent Labs  07/12/13 0808 07/13/13 0443  WBC 10.9* 10.2  HGB 8.5* 8.4*  HCT 29.1* 28.7*  MCV 71.5* 72.1*  PLT 266 275   Lab Results  Component Value Date   INR 1.21 05/26/2012   INR 0.96 12/19/2011   INR 1.0 09/06/2007   BNP    Component Value Date/Time   PROBNP 316.0* 07/08/2013 1425   Troponin <0.30   Discharge instructions:     Discharge Orders   Future Appointments Provider Department Dept Phone   08/04/2013 10:30 AM Kathee Delton, MD [facility] [phone]   10/19/2013 10:00 AM Kathee Delton, MD [facility] [phone]   Future Orders Complete By Expires   Diet - low sodium heart healthy  As directed    Diet Carb Modified  As directed    Discharge instructions  As directed    Comments:     Call if increased shortness of breath, swelling in legs, weight increases more than 5 lbs from baseline.  Weigh daily.  Fall precautions.  Can use Mucinex DM as needed for cough.   Increase activity slowly  As directed       Disposition: To home  Follow-up Appts: Follow-up with Dr. Brigitte Pulse at [facility] in 1 week. Our office will call for transition of care appointment.  Condition on Discharge: Improved  Tests Needing Follow-up: Repeat CBC and bmet  Signed: Harles Evetts,W DOUGLAS 07/13/2013, 7:21 AM

## 2013-07-13 NOTE — Progress Notes (Signed)
Patient has had a really dry non-productive cough throughout the night. Patient given cough syrup per PRN order earlier in the shift and had some relief and is has just had another dose of it now. Will continue to monitor to end of shift.

## 2013-07-13 NOTE — Progress Notes (Signed)
Pt discharged per w/c with her own  home 02 & all personal belongings . Aware of all home meds, folloqw up appts., and when to call MD. Acompanied by volunteer - driven home in private vehicle per husband.

## 2013-07-13 NOTE — Progress Notes (Signed)
Utilization review completed.  

## 2013-07-13 NOTE — Progress Notes (Signed)
Went over all d/c info with pt and her husband.

## 2013-07-14 LAB — CULTURE, BLOOD (ROUTINE X 2)
CULTURE: NO GROWTH
Culture: NO GROWTH

## 2013-07-22 DIAGNOSIS — I2789 Other specified pulmonary heart diseases: Secondary | ICD-10-CM | POA: Diagnosis not present

## 2013-07-22 DIAGNOSIS — R000 Tachycardia, unspecified: Secondary | ICD-10-CM | POA: Diagnosis not present

## 2013-07-22 DIAGNOSIS — Z23 Encounter for immunization: Secondary | ICD-10-CM | POA: Diagnosis not present

## 2013-07-22 DIAGNOSIS — D649 Anemia, unspecified: Secondary | ICD-10-CM | POA: Diagnosis not present

## 2013-07-22 DIAGNOSIS — A419 Sepsis, unspecified organism: Secondary | ICD-10-CM | POA: Diagnosis not present

## 2013-07-22 DIAGNOSIS — R0902 Hypoxemia: Secondary | ICD-10-CM | POA: Diagnosis not present

## 2013-07-22 DIAGNOSIS — J189 Pneumonia, unspecified organism: Secondary | ICD-10-CM | POA: Diagnosis not present

## 2013-07-22 DIAGNOSIS — I509 Heart failure, unspecified: Secondary | ICD-10-CM | POA: Diagnosis not present

## 2013-07-22 DIAGNOSIS — I10 Essential (primary) hypertension: Secondary | ICD-10-CM | POA: Diagnosis not present

## 2013-07-29 DIAGNOSIS — J449 Chronic obstructive pulmonary disease, unspecified: Secondary | ICD-10-CM | POA: Diagnosis not present

## 2013-07-29 DIAGNOSIS — I509 Heart failure, unspecified: Secondary | ICD-10-CM | POA: Diagnosis not present

## 2013-08-04 ENCOUNTER — Ambulatory Visit (INDEPENDENT_AMBULATORY_CARE_PROVIDER_SITE_OTHER): Payer: Medicare Other | Admitting: Pulmonary Disease

## 2013-08-04 ENCOUNTER — Telehealth: Payer: Self-pay

## 2013-08-04 ENCOUNTER — Encounter: Payer: Self-pay | Admitting: Pulmonary Disease

## 2013-08-04 ENCOUNTER — Ambulatory Visit (INDEPENDENT_AMBULATORY_CARE_PROVIDER_SITE_OTHER)
Admission: RE | Admit: 2013-08-04 | Discharge: 2013-08-04 | Disposition: A | Discharge status: Home / Self Care | Payer: Medicare Other | Source: Ambulatory Visit | Attending: Pulmonary Disease | Admitting: Pulmonary Disease

## 2013-08-04 VITALS — BP 122/68 | HR 109 | Temp 98.0°F | Ht 65.0 in | Wt 227.8 lb

## 2013-08-04 DIAGNOSIS — R0602 Shortness of breath: Secondary | ICD-10-CM | POA: Diagnosis not present

## 2013-08-04 DIAGNOSIS — J189 Pneumonia, unspecified organism: Secondary | ICD-10-CM

## 2013-08-04 DIAGNOSIS — J449 Chronic obstructive pulmonary disease, unspecified: Secondary | ICD-10-CM | POA: Diagnosis not present

## 2013-08-04 DIAGNOSIS — J439 Emphysema, unspecified: Secondary | ICD-10-CM

## 2013-08-04 DIAGNOSIS — J438 Other emphysema: Secondary | ICD-10-CM | POA: Diagnosis not present

## 2013-08-04 NOTE — Telephone Encounter (Signed)
Message copied by Len Blalock on Thu Aug 04, 2013  4:12 PM ------      Message from: Vernon      Created: Thu Aug 04, 2013 12:45 PM       Please let pt know that her xray is greatly improved.  Good new. ------

## 2013-08-04 NOTE — Assessment & Plan Note (Signed)
The patient appears to be stable from a COPD standpoint, with no bronchospasm on exam. I have asked her to continue on her current program dilator regimen, and more importantly to work on some type of conditioning program and weight loss. We will check a chest x-ray today to followup her left lower lobe pneumonia.

## 2013-08-04 NOTE — Progress Notes (Signed)
   Subjective:    Patient ID: Destiny Shelton, female    DOB: 04-14-1946, 68 y.o.   MRN: [id number]  HPI The patient comes in today for followup after her recent hospitalization for community-acquired pneumonia complicated by sepsis. She has a history of mild COPD by PFTs, as well as chronic dyspnea and hypoxemia related to a probable shunt that has yet to be identified and underlying diastolic heart failure. The patient states that her breathing is slowly returning to baseline, but she is still having significant weakness and deconditioning. She has minimal cough, and her mucus is clearing. She is staying on her bronchodilator regimen, as well as her oxygen. She has not had a followup chest film since the hospital discharge.    Review of Systems  Constitutional: Negative for fever and unexpected weight change.  HENT: Negative for congestion, dental problem, ear pain, nosebleeds, postnasal drip, rhinorrhea, sinus pressure, sneezing, sore throat and trouble swallowing.   Eyes: Negative for redness and itching.  Respiratory: Positive for cough, chest tightness, shortness of breath and wheezing.   Cardiovascular: Positive for chest pain, palpitations and leg swelling.  Gastrointestinal: Negative for nausea and vomiting.  Genitourinary: Negative for dysuria.  Musculoskeletal: Negative for joint swelling.  Skin: Negative for rash.  Neurological: Negative for headaches.  Hematological: Does not bruise/bleed easily.  Psychiatric/Behavioral: Negative for dysphoric mood. The patient is not nervous/anxious.        Objective:   Physical Exam Obese female in no acute distress Nose without purulence or discharge noted Oropharynx clear Neck is supple without lymphadenopathy or thyromegaly Chest with minimal left basilar crackles, otherwise clear with no wheezing Cardiac exam with regular rate and rhythm Lower extremities with minimal edema, no cyanosis Alert and oriented, moves all 4  extremities.       Assessment & Plan:

## 2013-08-04 NOTE — Telephone Encounter (Signed)
Pt aware of cxr results.  Nothing further needed. 

## 2013-08-04 NOTE — Patient Instructions (Signed)
No change in current medications Try and increase your activity as much as possible. Will check a chest xray today to followup your pneumonia Cancel apptm coming up in may, and schedule for July.  Please call if your breathing worsens.

## 2013-08-04 NOTE — Assessment & Plan Note (Signed)
Will check post hospital cxr today to make sure LLL process is clearing.

## 2013-08-05 DIAGNOSIS — I4891 Unspecified atrial fibrillation: Secondary | ICD-10-CM | POA: Diagnosis not present

## 2013-08-05 DIAGNOSIS — Z6835 Body mass index (BMI) 35.0-35.9, adult: Secondary | ICD-10-CM | POA: Diagnosis not present

## 2013-08-05 DIAGNOSIS — E1149 Type 2 diabetes mellitus with other diabetic neurological complication: Secondary | ICD-10-CM | POA: Diagnosis not present

## 2013-08-05 DIAGNOSIS — I10 Essential (primary) hypertension: Secondary | ICD-10-CM | POA: Diagnosis not present

## 2013-08-05 DIAGNOSIS — J449 Chronic obstructive pulmonary disease, unspecified: Secondary | ICD-10-CM | POA: Diagnosis not present

## 2013-08-05 DIAGNOSIS — I509 Heart failure, unspecified: Secondary | ICD-10-CM | POA: Diagnosis not present

## 2013-08-15 ENCOUNTER — Ambulatory Visit (INDEPENDENT_AMBULATORY_CARE_PROVIDER_SITE_OTHER): Payer: Medicare Other | Admitting: Cardiology

## 2013-08-15 ENCOUNTER — Encounter: Payer: Self-pay | Admitting: Cardiology

## 2013-08-15 VITALS — BP 110/60 | HR 112 | Ht 65.0 in | Wt 227.7 lb

## 2013-08-15 DIAGNOSIS — IMO0001 Reserved for inherently not codable concepts without codable children: Secondary | ICD-10-CM

## 2013-08-15 DIAGNOSIS — E669 Obesity, unspecified: Secondary | ICD-10-CM

## 2013-08-15 DIAGNOSIS — I2789 Other specified pulmonary heart diseases: Secondary | ICD-10-CM

## 2013-08-15 DIAGNOSIS — I272 Pulmonary hypertension, unspecified: Secondary | ICD-10-CM

## 2013-08-15 DIAGNOSIS — I4891 Unspecified atrial fibrillation: Secondary | ICD-10-CM | POA: Diagnosis not present

## 2013-08-15 DIAGNOSIS — I480 Paroxysmal atrial fibrillation: Secondary | ICD-10-CM

## 2013-08-15 DIAGNOSIS — J439 Emphysema, unspecified: Secondary | ICD-10-CM

## 2013-08-15 DIAGNOSIS — E1149 Type 2 diabetes mellitus with other diabetic neurological complication: Secondary | ICD-10-CM

## 2013-08-15 DIAGNOSIS — I10 Essential (primary) hypertension: Secondary | ICD-10-CM

## 2013-08-15 DIAGNOSIS — I498 Other specified cardiac arrhythmias: Secondary | ICD-10-CM

## 2013-08-15 DIAGNOSIS — R0602 Shortness of breath: Secondary | ICD-10-CM | POA: Diagnosis not present

## 2013-08-15 DIAGNOSIS — J438 Other emphysema: Secondary | ICD-10-CM

## 2013-08-15 DIAGNOSIS — I5032 Chronic diastolic (congestive) heart failure: Secondary | ICD-10-CM

## 2013-08-15 DIAGNOSIS — J9610 Chronic respiratory failure, unspecified whether with hypoxia or hypercapnia: Secondary | ICD-10-CM

## 2013-08-15 DIAGNOSIS — J189 Pneumonia, unspecified organism: Secondary | ICD-10-CM

## 2013-08-15 DIAGNOSIS — R000 Tachycardia, unspecified: Secondary | ICD-10-CM

## 2013-08-15 NOTE — Patient Instructions (Signed)
Conversation with Dr Oneal Deputy a heart standpoint stable.  Continue current medication  Your physician wants you to keep your  follow-up in July   2015;  30 min appointment.   You will receive a reminder letter in the mail two months in advance. If you don't receive a letter, please call our office to schedule the follow-up appointment.

## 2013-08-17 ENCOUNTER — Encounter: Payer: Self-pay | Admitting: Cardiology

## 2013-08-17 DIAGNOSIS — I5032 Chronic diastolic (congestive) heart failure: Secondary | ICD-10-CM | POA: Insufficient documentation

## 2013-08-17 NOTE — Assessment & Plan Note (Signed)
Moderately elevated pressures by right heart cath. Wedge pressure was still high suggesting of a secondary left ventricular diastolic component. She does have some suggests a mild intrapulmonary shunting.  Continue with home oxygen, rate control and diuresis.

## 2013-08-17 NOTE — Progress Notes (Signed)
PCP: Marton Redwood, MD  Clinic Note: Chief Complaint  Patient presents with  . Follow-up    post -hosp for pneumonia, sob with small amt exertion, use O2 via nasal cannula- 4 liter,some chest discomfort off/on since hospitalization, no edema, patient thinks she is not giong to bathroom as much with fliud pills     HPI: Destiny Shelton is a 68 y.o. female with a Cardiovascular Problem List below who presents today for hospital followup after recent admission for community acquired pneumonia. She was admitted on February 6 with exacerbation of her COPD and pneumonia. She spent 5 days in the hospital does, get by septic shock and acute respiratory failure. She suffered a decline of respiratory condition while in the emergency room and ended up admitted to the ICU for what appeared to be septic shock. She improved with IV fluids and antibiotics. Her verapamil as an restarted as her pressure improved. She was now noted to be volume overloaded and treated with Lasix. She was back to her 4 L of home oxygen prior to discharge  Interval History: She presents now at the behest of her primary physician for reevaluation. She is well-known to me with basically minimal coronary disease but minimal pulmonary hypertension and inappropriate sinus tachycardia. We've been treating her with high-dose Lasix for pulmonary vascular congestion secondary to primary left ventricular diastolic dysfunction. She is now back to her baseline dose of Lasix which is essentially 80 mg twice a day. At this higher dose she does definitely note symptomatic improvement over a reduced dose -- stating that her urine output goes down to lower doses to. She continues to have baseline tachycardia with with significant increases in heart rate with minor exertion. She currently again denies any chest tightness or pressure with rest or exertion. She still has her baseline exertional dyspnea and is gradually progressing from her pneumonia. She  still has some mild coughing, but is almost at her baseline as far as dyspnea goes. She is back to walking around the house as much as possible without acute problems. She does not note any irregular/rapid heartbeats. She notes that it has intermittent tightness in her chest with wheezing that is not necessarily associated with exertion. Her weight has basically been stable to actually reduced currently.  Cardiovascular ROS: positive for - dyspnea on exertion, rapid heart rate and Baseline dyspnea; mild baseline edema. negative for - chest pain, irregular heartbeat, loss of consciousness, murmur, orthopnea, palpitations or paroxysmal nocturnal dyspnea: Additional cardiac review of systems: Lightheadedness - no, dizziness - no, syncope/near-syncope - no; TIA/amaurosis fugax - no Melena - no, hematochezia no; hematuria - no; nosebleeds - no; claudication - no  Pertinent visit diagnoses from [facility].   Full PMH reviewed on Epic. 1. Community acquired pneumonia - Recent Admission (07/2013)   2. Chronic respiratory failure   3. Shortness of breath   4. COPD (chronic obstructive pulmonary disease) with emphysema   5. Pulmonary hypertension   6. Inappropriate sinus tachycardia   7. PAF (paroxysmal atrial fibrillation)   8. Diabetes mellitus type 2 with neurological manifestations   9. Obesity, Class II, BMI 35-39.9, with comorbidity   10. Chronic diastolic heart failure of unknown etiology   11. HYPERTENSION     Past Cardiovascular Evaluation / Procedures  Procedure Laterality Date  . Tee without cardioversion  03/24/2012    Procedure: TRANSESOPHAGEAL ECHOCARDIOGRAM (TEE);  Surgeon: Pixie Casino, MD;  Location: [facility] OR;  Service: Cardiovascular;  Laterality: N/A;  TEE with Bubble  .  Right and left cardiac catheterization  July 2013    RAP: 22 mmHg, and RVP: 46/16 mmHg, RV EDP 18; L. BP 110/11 mmHg, LVEDP 20 mmHg;  PAP 45/29 mmHg (mean 37 mmHg); PCWP 29/22 mmHg - 26 mmHg; no significant coronary  disease   MEDICATIONS AND ALLERGIES REVIEWED IN EPIC No Change in Social and Family History  ROS: A comprehensive Review of Systems - Negative except  standdarrd aarrtthhrritiis ppaiins in her hips, back and shoulders.  PHYSICAL EXAM BP 110/60  Pulse 112  Ht 5\' 5"  (1.651 m)  Wt 227 lb 11.2 oz (103.284 kg)  BMI 37.89 kg/m2 General appearance: A&O x3; cooperative, appears stated age, no distress, morbidly obese and Chronically ill-appearing but healthier than her last visit. Well-groomed. Obese, but no longer morbidly obese - mostly truncal. Has chronic oxygen from her concentrator. No increased work of breathing.  Neck: no carotid bruit, no JVD and supple, symmetrical, trachea midline  Lungs: Essentially distant breath sounds throughout, mostly CTA B. with mild interstitial sounds. No W./R./R. Nonlabored  Heart: Mildly tachycardic; Regular rhythm. Normal S1-S2, no M./R./G. Unable to palpate PMI due to body habitus.  Abdomen: soft, non-tender; bowel sounds normal; no masses, Unable to palpate HSM due to body habitus. No HJR  Extremities: edema Trace, no ulcers, gangrene or trophic changes and Mild spider veins, no significant varicosities  Pulses: 2+ and symmetric  Skin: Skin color, texture, turgor normal. No rashes or lesions  Neurologic: Grossly normal   Adult ECG Report  Rate: 112 ;  Rhythm: sinus tachycardia  QRS Axis: 58 ;  PR Interval: 150 ;  QRS Duration: 90 ; QTc: 447 (ms)  Voltages: Normal  Conduction Disturbances: none  Other Abnormalities: none   Narrative Interpretation: Sinus tachycardia but otherwise normal  Recent Labs reviewed from recent hospitalization.:   ASSESSMENT / PLAN: Community acquired pneumonia - Recent Admission (07/2013) Overall improved findings on post hospital x-ray ordered by her pulmonologist. She appear to be improving, but is slow to bounce back fully. She feels better with improved diuresis.  Chronic respiratory failure Again multifactorial  due to her COPD, obesity and diastolic failure. She remains on home oxygen at her baseline now. We'll continue with the same diuretic and rate control medications. I agree that weight loss is a very important treatment modality, however she's had a very difficult time losing weight. She is 3 tablet a day than at last visit. I can graduate her nonlabored. She needs to continue to walk with her 6 minute walk regimen.   COPD (chronic obstructive pulmonary disease) with emphysema Followed by pulmonologist. Continue current medications.  Pulmonary hypertension Moderately elevated pressures by right heart cath. Wedge pressure was still high suggesting of a secondary left ventricular diastolic component. She does have some suggests a mild intrapulmonary shunting.  Continue with home oxygen, rate control and diuresis.  Chronic diastolic heart failure of unknown etiology Again probably multifactorial. Not improved with tachycardia. Continue to be persisted with diuresis and rate control.  Would continue standing dose of Lasix 80 mg twice a day has been a good regimen for her in the past. I did tell her that if she any traveling she can probably take 120 in the morning and avoid the afternoon dose to avoid administered meds were stopped. We again discussed the sliding scale Lasix plan.  Inappropriate sinus tachycardia Continue verapamil. Goal resting heart rate to be in the 80s to 90s if possible.  HYPERTENSION Well-controlled. She was converted from Diovan to Avapro without  difficulty..  Obesity, Class II, BMI 35-39.9, with comorbidity This continues to be a grade trouble in aspect for her. Her exercise tolerance is low due to her baseline dyspnea and tachycardia. She still needs to continue to work on her exercise such as 6 minute walk. Also discussed again the importance of dietary modification. Her husband does most of the cooking.    Orders Placed This Encounter  Procedures  . EKG 12-Lead    No orders of the defined types were placed in this encounter.    Followup: In July as previous scheduled.  DAVID W. Ellyn Hack, M.D., M.S. Interventional Cardiologist [facility]

## 2013-08-17 NOTE — Assessment & Plan Note (Signed)
Again probably multifactorial. Not improved with tachycardia. Continue to be persisted with diuresis and rate control.  Would continue standing dose of Lasix 80 mg twice a day has been a good regimen for her in the past. I did tell her that if she any traveling she can probably take 120 in the morning and avoid the afternoon dose to avoid administered meds were stopped. We again discussed the sliding scale Lasix plan.

## 2013-08-17 NOTE — Assessment & Plan Note (Signed)
Continue verapamil. Goal resting heart rate to be in the 80s to 90s if possible.

## 2013-08-17 NOTE — Assessment & Plan Note (Signed)
Overall improved findings on post hospital x-ray ordered by her pulmonologist. She appear to be improving, but is slow to bounce back fully. She feels better with improved diuresis.

## 2013-08-17 NOTE — Assessment & Plan Note (Signed)
Followed by pulmonologist. Continue current medications.

## 2013-08-17 NOTE — Assessment & Plan Note (Signed)
Well-controlled. She was converted from Diovan to Avapro without difficulty.Marland Kitchen

## 2013-08-17 NOTE — Assessment & Plan Note (Signed)
Again multifactorial due to her COPD, obesity and diastolic failure. She remains on home oxygen at her baseline now. We'll continue with the same diuretic and rate control medications. I agree that weight loss is a very important treatment modality, however she's had a very difficult time losing weight. She is 3 tablet a day than at last visit. I can graduate her nonlabored. She needs to continue to walk with her 6 minute walk regimen.

## 2013-08-17 NOTE — Assessment & Plan Note (Signed)
This continues to be a grade trouble in aspect for her. Her exercise tolerance is low due to her baseline dyspnea and tachycardia. She still needs to continue to work on her exercise such as 6 minute walk. Also discussed again the importance of dietary modification. Her husband does most of the cooking.

## 2013-08-22 DIAGNOSIS — D649 Anemia, unspecified: Secondary | ICD-10-CM | POA: Diagnosis not present

## 2013-08-22 DIAGNOSIS — A419 Sepsis, unspecified organism: Secondary | ICD-10-CM | POA: Diagnosis not present

## 2013-08-22 DIAGNOSIS — I509 Heart failure, unspecified: Secondary | ICD-10-CM | POA: Diagnosis not present

## 2013-08-22 DIAGNOSIS — R000 Tachycardia, unspecified: Secondary | ICD-10-CM | POA: Diagnosis not present

## 2013-08-22 DIAGNOSIS — I10 Essential (primary) hypertension: Secondary | ICD-10-CM | POA: Diagnosis not present

## 2013-08-22 DIAGNOSIS — I2789 Other specified pulmonary heart diseases: Secondary | ICD-10-CM | POA: Diagnosis not present

## 2013-08-31 DIAGNOSIS — I509 Heart failure, unspecified: Secondary | ICD-10-CM | POA: Diagnosis not present

## 2013-08-31 DIAGNOSIS — I10 Essential (primary) hypertension: Secondary | ICD-10-CM | POA: Diagnosis not present

## 2013-08-31 DIAGNOSIS — M549 Dorsalgia, unspecified: Secondary | ICD-10-CM | POA: Diagnosis not present

## 2013-08-31 DIAGNOSIS — J449 Chronic obstructive pulmonary disease, unspecified: Secondary | ICD-10-CM | POA: Diagnosis not present

## 2013-08-31 DIAGNOSIS — E1149 Type 2 diabetes mellitus with other diabetic neurological complication: Secondary | ICD-10-CM | POA: Diagnosis not present

## 2013-08-31 DIAGNOSIS — E785 Hyperlipidemia, unspecified: Secondary | ICD-10-CM | POA: Diagnosis not present

## 2013-08-31 DIAGNOSIS — I2789 Other specified pulmonary heart diseases: Secondary | ICD-10-CM | POA: Diagnosis not present

## 2013-09-16 DIAGNOSIS — H524 Presbyopia: Secondary | ICD-10-CM | POA: Diagnosis not present

## 2013-09-16 DIAGNOSIS — H40009 Preglaucoma, unspecified, unspecified eye: Secondary | ICD-10-CM | POA: Diagnosis not present

## 2013-09-16 DIAGNOSIS — H5200 Hypermetropia, unspecified eye: Secondary | ICD-10-CM | POA: Diagnosis not present

## 2013-09-16 DIAGNOSIS — H52229 Regular astigmatism, unspecified eye: Secondary | ICD-10-CM | POA: Diagnosis not present

## 2013-09-21 ENCOUNTER — Other Ambulatory Visit: Payer: Self-pay | Admitting: Cardiology

## 2013-09-21 NOTE — Telephone Encounter (Signed)
Rx was sent to pharmacy electronically. 

## 2013-09-29 DIAGNOSIS — J449 Chronic obstructive pulmonary disease, unspecified: Secondary | ICD-10-CM | POA: Diagnosis not present

## 2013-09-29 DIAGNOSIS — I509 Heart failure, unspecified: Secondary | ICD-10-CM | POA: Diagnosis not present

## 2013-10-03 DIAGNOSIS — I509 Heart failure, unspecified: Secondary | ICD-10-CM | POA: Diagnosis not present

## 2013-10-03 DIAGNOSIS — J449 Chronic obstructive pulmonary disease, unspecified: Secondary | ICD-10-CM | POA: Diagnosis not present

## 2013-10-03 DIAGNOSIS — Z6836 Body mass index (BMI) 36.0-36.9, adult: Secondary | ICD-10-CM | POA: Diagnosis not present

## 2013-10-11 ENCOUNTER — Encounter: Payer: Self-pay | Admitting: Internal Medicine

## 2013-10-19 ENCOUNTER — Ambulatory Visit: Payer: Medicare Other | Admitting: Pulmonary Disease

## 2013-10-19 ENCOUNTER — Telehealth: Payer: Self-pay

## 2013-10-19 ENCOUNTER — Ambulatory Visit (INDEPENDENT_AMBULATORY_CARE_PROVIDER_SITE_OTHER): Payer: Medicare Other | Admitting: Internal Medicine

## 2013-10-19 ENCOUNTER — Encounter: Payer: Self-pay | Admitting: Internal Medicine

## 2013-10-19 ENCOUNTER — Other Ambulatory Visit (INDEPENDENT_AMBULATORY_CARE_PROVIDER_SITE_OTHER): Payer: Medicare Other

## 2013-10-19 ENCOUNTER — Other Ambulatory Visit: Payer: Self-pay | Admitting: Internal Medicine

## 2013-10-19 VITALS — BP 114/52 | HR 100 | Ht 65.0 in | Wt 230.0 lb

## 2013-10-19 DIAGNOSIS — I4891 Unspecified atrial fibrillation: Secondary | ICD-10-CM

## 2013-10-19 DIAGNOSIS — D126 Benign neoplasm of colon, unspecified: Secondary | ICD-10-CM

## 2013-10-19 DIAGNOSIS — D509 Iron deficiency anemia, unspecified: Secondary | ICD-10-CM

## 2013-10-19 DIAGNOSIS — I480 Paroxysmal atrial fibrillation: Secondary | ICD-10-CM

## 2013-10-19 LAB — CBC WITH DIFFERENTIAL/PLATELET
BASOS ABS: 0 10*3/uL (ref 0.0–0.1)
Basophils Relative: 0.3 % (ref 0.0–3.0)
EOS PCT: 3.1 % (ref 0.0–5.0)
Eosinophils Absolute: 0.4 10*3/uL (ref 0.0–0.7)
HEMATOCRIT: 30.9 % — AB (ref 36.0–46.0)
Hemoglobin: 9.6 g/dL — ABNORMAL LOW (ref 12.0–15.0)
LYMPHS PCT: 26.4 % (ref 12.0–46.0)
Lymphs Abs: 3 10*3/uL (ref 0.7–4.0)
MCHC: 31 g/dL (ref 30.0–36.0)
MCV: 67.5 fl — ABNORMAL LOW (ref 78.0–100.0)
MONOS PCT: 9.4 % (ref 3.0–12.0)
Monocytes Absolute: 1.1 10*3/uL — ABNORMAL HIGH (ref 0.1–1.0)
NEUTROS PCT: 60.8 % (ref 43.0–77.0)
Neutro Abs: 7 10*3/uL (ref 1.4–7.7)
PLATELETS: 341 10*3/uL (ref 150.0–400.0)
RBC: 4.59 Mil/uL (ref 3.87–5.11)
RDW: 18.3 % — AB (ref 11.5–15.5)
WBC: 11.6 10*3/uL — ABNORMAL HIGH (ref 4.0–10.5)

## 2013-10-19 NOTE — Progress Notes (Signed)
       Subjective:    Patient ID: Destiny Shelton, female    DOB: 06/04/1945, 67 y.o.   MRN: [id number]  HPI Destiny Shelton and her husband return for f/u of large sigmoid opolyp with high-grade dysplasia. Last colon exam 11/2012. She has been hospitalized in Feb with pneumonia and sepsis and says she feels recovered at this time. Chronic O2 and dyspnea continue. She has had some dark black stools - on ferrous sulfate. We had decided against any surgery for the polyp due to co-morbidities and lack of cancer in bxs.  Allergies  Allergen Reactions  . Aspirin Shortness Of Breath  . Nsaids Shortness Of Breath   Outpatient Prescriptions Prior to Visit  Medication Sig Dispense Refill  . acetaminophen (TYLENOL) 500 MG tablet Take 1,000 mg by mouth every 6 (six) hours as needed for mild pain.      . ALPRAZolam (XANAX) 0.5 MG tablet Take 0.5-1 mg by mouth at bedtime as needed. For anxiety      . AMBULATORY NON FORMULARY MEDICATION Continuous O2 @@ 4LMP      . atorvastatin (LIPITOR) 40 MG tablet Take 1 tablet by mouth daily.      . benzonatate (TESSALON) 200 MG capsule Take 400 mg by mouth at bedtime. For cough      . budesonide-formoterol (SYMBICORT) 160-4.5 MCG/ACT inhaler Inhale 2 puffs into the lungs 2 (two) times daily.  10.2 g  6  . CALCIUM-VITAMIN D PO Take 1 tablet by mouth 2 (two) times daily.       . cyclobenzaprine (FLEXERIL) 10 MG tablet Take 10 mg by mouth at bedtime.       . docusate sodium (COLACE) 100 MG capsule Take 100 mg by mouth 2 (two) times daily.       . DULoxetine (CYMBALTA) 60 MG capsule Take 60 mg by mouth every morning.       . furosemide (LASIX) 40 MG tablet Take 20 mg by mouth 2 (two) times daily.      . gabapentin (NEURONTIN) 600 MG tablet Take 600 mg by mouth 3 (three) times daily.       . HYDROcodone-acetaminophen (NORCO/VICODIN) 5-325 MG per tablet Take 1 tablet by mouth every 8 (eight) hours as needed for pain.      . insulin aspart (NOVOLOG) 100 UNIT/ML injection Inject  10 Units into the skin 2 (two) times daily.       . insulin glargine (LANTUS) 100 UNIT/ML injection Inject 70 Units into the skin daily before breakfast.  10 mL    . irbesartan (AVAPRO) 150 MG tablet Take 150 mg by mouth daily.      . iron polysaccharides (NIFEREX) 150 MG capsule Take 1 capsule (150 mg total) by mouth daily.  30 capsule  6  . levalbuterol (XOPENEX HFA) 45 MCG/ACT inhaler Inhale 1-2 puffs into the lungs every 4 (four) hours as needed for wheezing.  1 Inhaler  12  . Multiple Vitamin (MULTIVITAMIN) tablet Take 1 tablet by mouth daily.        . Omega-3 Fatty Acids (FISH OIL) 1000 MG CAPS Take 1 capsule by mouth 3 (three) times daily.       . omeprazole (PRILOSEC) 20 MG capsule Take 20 mg by mouth at bedtime.       . polyethylene glycol (MIRALAX / GLYCOLAX) packet Take 17 g by mouth daily as needed for mild constipation.      . potassium chloride (KLOR-CON) 10 MEQ CR tablet Take   10 mEq by mouth 3 (three) times daily.       . Rivaroxaban (XARELTO) 20 MG TABS Take 1 tablet (20 mg total) by mouth daily with supper. Restart on Friday 12/24/2012  30 tablet    . roflumilast (DALIRESP) 500 MCG TABS tablet Take 500 mcg by mouth daily.       . tiotropium (SPIRIVA) 18 MCG inhalation capsule Place 18 mcg into inhaler and inhale daily.      . verapamil (VERELAN PM) 360 MG 24 hr capsule TAKE 1 CAPSULE BY MOUTH DAILY  30 capsule  10   No facility-administered medications prior to visit.   Past Medical History  Diagnosis Date  . ALLERGIC RHINITIS   . Hypertension   . Adenomatous colon polyp   . Gastritis   . Diverticulosis of colon   . Diabetes mellitus type 2 with neurological manifestations     On Insulin  . Depression   . Osteoporosis   . Morbidly obese   . Back pain, chronic   . Anxiety   . Hyperlipemia   . COPD (chronic obstructive pulmonary disease)     Requiring Home O2 at 4L  . GERD (gastroesophageal reflux disease)   . Urosepsis 05/26/2012  . Atrial tachycardia     Mostly  Sinus Tachycardia  . PAF (paroxysmal atrial fibrillation)   . Osteoarthritis   . Sleep apnea     no cpcp machine 02 at 4l/min  . External hemorrhoids   . Carotid stenosis   . Nephrolithiasis   . Headache(784.0)     tension  . Neuromuscular disorder     diabetic neuropathy  . Fibromyalgia     "pain in arms and shoulders."  . Anemia     iron deficient  . Inappropriate sinus tachycardia   . 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  . Community acquired pneumonia - Recent Admission (07/2013) 07/08/2013   Past Surgical History  Procedure Laterality Date  . Appendectomy  1963  . Cholecystectomy  1963  . Abdominal hysterectomy  1978  . Total knee arthroplasty  1997    right  . Upper gastrointestinal endoscopy  04/09/2006    w/Dilation, gastritis  . Colonoscopy w/ biopsies and polypectomy  07/22/2007, 04/28/2007    2009: diverticulosis, 2008: diverticulosis, adenomatous polyps  . Tee without cardioversion  03/24/2012    Procedure: TRANSESOPHAGEAL ECHOCARDIOGRAM (TEE);  Surgeon: Kenneth C. Hilty, MD;  Location: [facility] OR;  Service: Cardiovascular;  Laterality: N/A;  TEE with Bubble  . Esophagogastroduodenoscopy (egd) with propofol N/A 10/01/2012    Procedure: ESOPHAGOGASTRODUODENOSCOPY (EGD) WITH PROPOFOL;  Surgeon: Zoii Florer E Lindsey Demonte, MD;  Location: [facility];  Service: Endoscopy;  Laterality: N/A;  . Colonoscopy with propofol N/A 10/01/2012    Procedure: COLONOSCOPY WITH PROPOFOL;  Surgeon: Ruble Buttler E Ludell Zacarias, MD;  Location: [facility];  Service: Endoscopy;  Laterality: N/A;  . Colonoscopy N/A 12/21/2012    Procedure: COLONOSCOPY;  Surgeon: Miasha Emmons E Noriko Macari, MD;  Location: [facility];  Service: Endoscopy;  Laterality: N/A;  . Hot hemostasis N/A 12/21/2012    Procedure: HOT HEMOSTASIS (ARGON PLASMA COAGULATION/BICAP);  Surgeon: Jan Walters E Jeiry Birnbaum, MD;  Location: [facility];  Service: Endoscopy;  Laterality: N/A;  . Right and left cardiac catheterization  July 2013    RAP: 22 mmHg, and RVP: 46/16 mmHg, RV EDP 18; L. BP  110/11 mmHg, LVEDP 20 mmHg;  PAP 45/29 mmHg (mean 37 mmHg); PCWP 29/22 mmHg - 26 mmHg; no significant coronary disease   History   Social History  . Marital Status:   Married    Spouse Name: N/A    Number of Children: 2  . Years of Education: HS   Occupational History  . Disability    Social History Main Topics  . Smoking status: Former Smoker -- 2.00 packs/day for 30 years    Types: Cigarettes    Quit date: 06/02/1994  . Smokeless tobacco: Never Used  . Alcohol Use: No  . Drug Use: No  . Sexual Activity: No   Other Topics Concern  . None   Social History Narrative   She is a married mother of 2, grandmother of 6, great-grandmother of 2. She is with her husband here today. She does not really exercise as she is on home oxygen and has baseline dyspnea.    Does not smoke. Does not drink. She quit smoking somewhere between 10-15 years ago. She is now trying to work on picking up her activity level. She is trying to I think more than likely get back into pulmonary rehabilitation potentially.       Daily caffeine.    Family History  Problem Relation Age of Onset  . Heart disease Mother   . Stroke Mother   . Heart disease Father   . Heart disease Brother   . Stroke Brother   . Skin cancer Brother   . Colon cancer Neg Hx   . Colon polyps Sister   . Diabetes Sister   . Diabetes Brother   . Irritable bowel syndrome Daughter     Review of Systems As above,    Objective:   Physical Exam General:  NAD, on O2 Eyes:   anicteric Lungs:  clear Heart:  S1S2 no rubs, murmurs or gallops  Data Reviewed:  Hospital notes Lab Results  Component Value Date   WBC 10.2 07/13/2013   HGB 8.4* 07/13/2013   HCT 28.7* 07/13/2013   MCV 72.1* 07/13/2013   PLT 275 07/13/2013      Assessment & Plan:  Sigmoid tubular adenoma with high-grade dysplasia Repeat sigmoidoscopy/colonoscopy top assess status - have held on surgery due to risk/benefit ratio without Ca dx and her co-morbidities - we  both agree time to reassess and see what is next as it has not been removable via scope Will need to hold Xarelto - check with cardiology ([facility]) MAC MiraLax prep The risks and benefits as well as alternatives of endoscopic procedure(s) have been discussed and reviewed. All questions answered. The patient agrees to proceed. Stroke risk off Xarelto reviewed   PAF (paroxysmal atrial fibrillation) Best to hold Xarelto prior to colonoscopy - will clear with cardiology  Anemia - check CBC Cc:Shaw, William, MD, David Harding, MD  

## 2013-10-19 NOTE — Assessment & Plan Note (Addendum)
Repeat sigmoidoscopy/colonoscopy top assess status - have held on surgery due to risk/benefit ratio without Ca dx and her co-morbidities - we both agree time to reassess and see what is next as it has not been removable via scope Will need to hold Xarelto - check with cardiology Ellyn Hack) MAC MiraLax prep The risks and benefits as well as alternatives of endoscopic procedure(s) have been discussed and reviewed. All questions answered. The patient agrees to proceed. Stroke risk off Xarelto reviewed

## 2013-10-19 NOTE — Telephone Encounter (Signed)
Usually, we have patients stop ~48 hrs pre-procedure & restart 1-2 days post.  Leonie Man, MD

## 2013-10-19 NOTE — Patient Instructions (Addendum)
You have been scheduled for a colonoscopy with propofol. Please follow written instructions given to you at your visit today.  Please pick up your prep supplies at the pharmacy. If you use inhalers (even only as needed), please bring them with you on the day of your procedure.  You will be contaced by our office prior to your procedure for directions on holding your xarelto.  If you do not hear from our office 1 week prior to your scheduled procedure, please call [phone] to discuss.    Your physician has requested that you go to the basement for the following lab work before leaving today: CBC/diff  I appreciate the opportunity to care for you.

## 2013-10-19 NOTE — Telephone Encounter (Signed)
[facility] [address] [place] [address]  10/19/2013   RE: Destiny Shelton DOB: 12/11/1945 MRN: [id number]   Dear Dr. Glenetta Hew,    We have scheduled the above patient for an endoscopic procedure. Our records show that she is on anticoagulation therapy.   Please advise as to how long the patient may come off her therapy of xarelto prior to the colonoscopy procedure, which is scheduled for 11/17/13.  Please fax back/ or route the completed form to [place] [place], [place] at [phone].   Sincerely,    Dr. Silvano Rusk

## 2013-10-19 NOTE — Assessment & Plan Note (Signed)
Best to hold Xarelto prior to colonoscopy - will clear with cardiology

## 2013-10-20 NOTE — Telephone Encounter (Signed)
Message copied by Larina Bras on Thu Oct 20, 2013 10:37 AM ------      Message from: Larina Bras      Created: Thu Oct 20, 2013 10:37 AM       ----- Message -----         From: Gatha Mayer, MD         Sent: 10/20/2013   8:06 AM           To: Patti E [place], CMA                        ----- Message -----         From: Leonie Man, MD         Sent: 10/19/2013   4:07 PM           To: Gatha Mayer, MD            Glendell Docker - I think it would be fine to hold Xarelto for 48hr pre-procedure -- is pretty much cleared by then.  Restart 1-2 days post - depending upon what you do.            Thanks,            Shanon Brow ------

## 2013-10-20 NOTE — Telephone Encounter (Signed)
I have spoken to patient to advise her that per Dr Ellyn Hack, she may hold xarelto 48 hours prior to her procedure. She verbalizes understanding.

## 2013-10-20 NOTE — Progress Notes (Signed)
Quick Note:  Hgb improved - stay on iron Will notify by My Chart and cc Dr. Brigitte Pulse ______

## 2013-10-26 ENCOUNTER — Encounter (HOSPITAL_COMMUNITY): Payer: Self-pay | Admitting: Pharmacy Technician

## 2013-11-04 ENCOUNTER — Encounter (HOSPITAL_COMMUNITY): Payer: Self-pay | Admitting: *Deleted

## 2013-11-10 ENCOUNTER — Telehealth: Payer: Self-pay | Admitting: Internal Medicine

## 2013-11-10 NOTE — Telephone Encounter (Signed)
Questions answered about medications.

## 2013-11-17 ENCOUNTER — Ambulatory Visit (HOSPITAL_COMMUNITY)
Admission: RE | Admit: 2013-11-17 | Discharge: 2013-11-17 | Disposition: A | Discharge status: Home / Self Care | Payer: Medicare Other | Source: Ambulatory Visit | Attending: Internal Medicine | Admitting: Internal Medicine

## 2013-11-17 ENCOUNTER — Encounter (HOSPITAL_COMMUNITY)
Admission: RE | Disposition: A | Discharge status: Home / Self Care | Payer: Self-pay | Source: Ambulatory Visit | Attending: Internal Medicine

## 2013-11-17 ENCOUNTER — Encounter (HOSPITAL_COMMUNITY): Payer: Medicare Other | Admitting: *Deleted

## 2013-11-17 ENCOUNTER — Encounter (HOSPITAL_COMMUNITY): Payer: Self-pay

## 2013-11-17 ENCOUNTER — Ambulatory Visit (HOSPITAL_COMMUNITY): Payer: Medicare Other | Admitting: *Deleted

## 2013-11-17 DIAGNOSIS — Z7901 Long term (current) use of anticoagulants: Secondary | ICD-10-CM | POA: Diagnosis not present

## 2013-11-17 DIAGNOSIS — F329 Major depressive disorder, single episode, unspecified: Secondary | ICD-10-CM | POA: Diagnosis not present

## 2013-11-17 DIAGNOSIS — Z87891 Personal history of nicotine dependence: Secondary | ICD-10-CM | POA: Insufficient documentation

## 2013-11-17 DIAGNOSIS — I10 Essential (primary) hypertension: Secondary | ICD-10-CM | POA: Diagnosis not present

## 2013-11-17 DIAGNOSIS — K219 Gastro-esophageal reflux disease without esophagitis: Secondary | ICD-10-CM | POA: Diagnosis not present

## 2013-11-17 DIAGNOSIS — J449 Chronic obstructive pulmonary disease, unspecified: Secondary | ICD-10-CM | POA: Diagnosis not present

## 2013-11-17 DIAGNOSIS — G4730 Sleep apnea, unspecified: Secondary | ICD-10-CM | POA: Insufficient documentation

## 2013-11-17 DIAGNOSIS — K5730 Diverticulosis of large intestine without perforation or abscess without bleeding: Secondary | ICD-10-CM | POA: Diagnosis not present

## 2013-11-17 DIAGNOSIS — F3289 Other specified depressive episodes: Secondary | ICD-10-CM | POA: Diagnosis not present

## 2013-11-17 DIAGNOSIS — Z9981 Dependence on supplemental oxygen: Secondary | ICD-10-CM | POA: Diagnosis not present

## 2013-11-17 DIAGNOSIS — K5900 Constipation, unspecified: Secondary | ICD-10-CM | POA: Diagnosis not present

## 2013-11-17 DIAGNOSIS — F411 Generalized anxiety disorder: Secondary | ICD-10-CM | POA: Insufficient documentation

## 2013-11-17 DIAGNOSIS — Z794 Long term (current) use of insulin: Secondary | ICD-10-CM | POA: Insufficient documentation

## 2013-11-17 DIAGNOSIS — J4489 Other specified chronic obstructive pulmonary disease: Secondary | ICD-10-CM | POA: Insufficient documentation

## 2013-11-17 DIAGNOSIS — E119 Type 2 diabetes mellitus without complications: Secondary | ICD-10-CM | POA: Insufficient documentation

## 2013-11-17 DIAGNOSIS — I4891 Unspecified atrial fibrillation: Secondary | ICD-10-CM | POA: Diagnosis not present

## 2013-11-17 DIAGNOSIS — Z79899 Other long term (current) drug therapy: Secondary | ICD-10-CM | POA: Insufficient documentation

## 2013-11-17 DIAGNOSIS — D126 Benign neoplasm of colon, unspecified: Secondary | ICD-10-CM | POA: Insufficient documentation

## 2013-11-17 DIAGNOSIS — Z86010 Personal history of colonic polyps: Secondary | ICD-10-CM

## 2013-11-17 HISTORY — PX: COLONOSCOPY: SHX5424

## 2013-11-17 LAB — GLUCOSE, CAPILLARY: Glucose-Capillary: 94 mg/dL (ref 70–99)

## 2013-11-17 SURGERY — COLONOSCOPY
Anesthesia: Monitor Anesthesia Care

## 2013-11-17 MED ORDER — PROPOFOL 10 MG/ML IV BOLUS
INTRAVENOUS | Status: AC
  Filled 2013-11-17: qty 20

## 2013-11-17 MED ORDER — GLYCOPYRROLATE 0.2 MG/ML IJ SOLN
INTRAMUSCULAR | Status: AC
  Filled 2013-11-17: qty 1

## 2013-11-17 MED ORDER — LIDOCAINE HCL (CARDIAC) 20 MG/ML IV SOLN
INTRAVENOUS | Status: DC | PRN
  Administered 2013-11-17: 50 mg via INTRAVENOUS

## 2013-11-17 MED ORDER — METOCLOPRAMIDE HCL 5 MG/ML IJ SOLN
INTRAMUSCULAR | Status: AC
  Filled 2013-11-17: qty 2

## 2013-11-17 MED ORDER — SODIUM CHLORIDE 0.9 % IV SOLN: INTRAVENOUS | Status: DC

## 2013-11-17 MED ORDER — RIVAROXABAN 20 MG PO TABS: 20.0000 mg | ORAL_TABLET | Freq: Every day | ORAL | Status: DC

## 2013-11-17 MED ORDER — PROPOFOL INFUSION 10 MG/ML OPTIME
INTRAVENOUS | Status: DC | PRN
  Administered 2013-11-17: 75 ug/kg/min via INTRAVENOUS

## 2013-11-17 MED ORDER — LACTATED RINGERS IV SOLN
INTRAVENOUS | Status: DC
  Administered 2013-11-17: 11:00:00 via INTRAVENOUS

## 2013-11-17 MED ORDER — PROPOFOL 10 MG/ML IV BOLUS
INTRAVENOUS | Status: DC | PRN
  Administered 2013-11-17: 25 mg via INTRAVENOUS
  Administered 2013-11-17: 50 mg via INTRAVENOUS
  Administered 2013-11-17: 25 mg via INTRAVENOUS

## 2013-11-17 MED ORDER — LIDOCAINE HCL (CARDIAC) 20 MG/ML IV SOLN
INTRAVENOUS | Status: AC
  Filled 2013-11-17: qty 5

## 2013-11-17 NOTE — Discharge Instructions (Signed)
I removed and cauterized more of the large polyp and removed one other small polyp.  I decided not to do a total colonoscopy.  I think the polyp looked better (less there) than before and saw no obvious signs of cancer. The biopsies will tell us more and I should know by Monday and will let you know.  Restart Xarelto on Saturday June 20.  I appreciate the opportunity to care for you. Gatha Mayer, MD, Marval Regal

## 2013-11-17 NOTE — H&P (View-Only) (Signed)
Subjective:    Patient ID: Destiny Shelton, female    DOB: [birth year], 68 y.o.   MRN: [id number]  HPI [name] and her husband return for f/u of large sigmoid opolyp with high-grade dysplasia. Last colon exam 11/2012. She has been hospitalized in Feb with pneumonia and sepsis and says she feels recovered at this time. Chronic O2 and dyspnea continue. She has had some dark black stools - on ferrous sulfate. We had decided against any surgery for the polyp due to co-morbidities and lack of cancer in bxs.  Allergies  Allergen Reactions  . Aspirin Shortness Of Breath  . Nsaids Shortness Of Breath   Outpatient Prescriptions Prior to Visit  Medication Sig Dispense Refill  . acetaminophen (TYLENOL) 500 MG tablet Take 1,000 mg by mouth every 6 (six) hours as needed for mild pain.      Marland Kitchen ALPRAZolam (XANAX) 0.5 MG tablet Take 0.5-1 mg by mouth at bedtime as needed. For anxiety      . AMBULATORY NON FORMULARY MEDICATION Continuous O2 @@ 4LMP      . atorvastatin (LIPITOR) 40 MG tablet Take 1 tablet by mouth daily.      . benzonatate (TESSALON) 200 MG capsule Take 400 mg by mouth at bedtime. For cough      . budesonide-formoterol (SYMBICORT) 160-4.5 MCG/ACT inhaler Inhale 2 puffs into the lungs 2 (two) times daily.  10.2 g  6  . CALCIUM-VITAMIN D PO Take 1 tablet by mouth 2 (two) times daily.       . cyclobenzaprine (FLEXERIL) 10 MG tablet Take 10 mg by mouth at bedtime.       . docusate sodium (COLACE) 100 MG capsule Take 100 mg by mouth 2 (two) times daily.       . DULoxetine (CYMBALTA) 60 MG capsule Take 60 mg by mouth every morning.       . furosemide (LASIX) 40 MG tablet Take 20 mg by mouth 2 (two) times daily.      Marland Kitchen gabapentin (NEURONTIN) 600 MG tablet Take 600 mg by mouth 3 (three) times daily.       Marland Kitchen HYDROcodone-acetaminophen (NORCO/VICODIN) 5-325 MG per tablet Take 1 tablet by mouth every 8 (eight) hours as needed for pain.      Marland Kitchen insulin aspart (NOVOLOG) 100 UNIT/ML injection Inject  10 Units into the skin 2 (two) times daily.       . insulin glargine (LANTUS) 100 UNIT/ML injection Inject 70 Units into the skin daily before breakfast.  10 mL    . irbesartan (AVAPRO) 150 MG tablet Take 150 mg by mouth daily.      . iron polysaccharides (NIFEREX) 150 MG capsule Take 1 capsule (150 mg total) by mouth daily.  30 capsule  6  . levalbuterol (XOPENEX HFA) 45 MCG/ACT inhaler Inhale 1-2 puffs into the lungs every 4 (four) hours as needed for wheezing.  1 Inhaler  12  . Multiple Vitamin (MULTIVITAMIN) tablet Take 1 tablet by mouth daily.        . Omega-3 Fatty Acids (FISH OIL) 1000 MG CAPS Take 1 capsule by mouth 3 (three) times daily.       Marland Kitchen omeprazole (PRILOSEC) 20 MG capsule Take 20 mg by mouth at bedtime.       . polyethylene glycol (MIRALAX / GLYCOLAX) packet Take 17 g by mouth daily as needed for mild constipation.      . potassium chloride (KLOR-CON) 10 MEQ CR tablet Take  10 mEq by mouth 3 (three) times daily.       . Rivaroxaban (XARELTO) 20 MG TABS Take 1 tablet (20 mg total) by mouth daily with supper. Restart on Friday 12/24/2012  30 tablet    . roflumilast (DALIRESP) 500 MCG TABS tablet Take 500 mcg by mouth daily.       Marland Kitchen tiotropium (SPIRIVA) 18 MCG inhalation capsule Place 18 mcg into inhaler and inhale daily.      . verapamil (VERELAN PM) 360 MG 24 hr capsule TAKE 1 CAPSULE BY MOUTH DAILY  30 capsule  10   No facility-administered medications prior to visit.   Past Medical History  Diagnosis Date  . ALLERGIC RHINITIS   . Hypertension   . Adenomatous colon polyp   . Gastritis   . Diverticulosis of colon   . Diabetes mellitus type 2 with neurological manifestations     On Insulin  . Depression   . Osteoporosis   . Morbidly obese   . Back pain, chronic   . Anxiety   . Hyperlipemia   . COPD (chronic obstructive pulmonary disease)     Requiring Home O2 at 4L  . GERD (gastroesophageal reflux disease)   . Urosepsis 05/26/2012  . Atrial tachycardia     Mostly  Sinus Tachycardia  . PAF (paroxysmal atrial fibrillation)   . Osteoarthritis   . Sleep apnea     no cpcp machine 02 at 4l/min  . External hemorrhoids   . Carotid stenosis   . Nephrolithiasis   . Headache(784.0)     tension  . Neuromuscular disorder     diabetic neuropathy  . Fibromyalgia     "pain in arms and shoulders."  . Anemia     iron deficient  . Inappropriate sinus tachycardia   . 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  . Community acquired pneumonia - Recent Admission (07/2013) 07/08/2013   Past Surgical History  Procedure Laterality Date  . Appendectomy  1963  . Cholecystectomy  1963  . Abdominal hysterectomy  1978  . Total knee arthroplasty  1997    right  . Upper gastrointestinal endoscopy  04/09/2006    w/Dilation, gastritis  . Colonoscopy w/ biopsies and polypectomy  07/22/2007, 04/28/2007    2009: diverticulosis, 2008: diverticulosis, adenomatous polyps  . Tee without cardioversion  03/24/2012    Procedure: TRANSESOPHAGEAL ECHOCARDIOGRAM (TEE);  Surgeon: Pixie Casino, MD;  Location: [facility] OR;  Service: Cardiovascular;  Laterality: N/A;  TEE with Bubble  . Esophagogastroduodenoscopy (egd) with propofol N/A 10/01/2012    Procedure: ESOPHAGOGASTRODUODENOSCOPY (EGD) WITH PROPOFOL;  Surgeon: Gatha Mayer, MD;  Location: [facility] ENDOSCOPY;  Service: Endoscopy;  Laterality: N/A;  . Colonoscopy with propofol N/A 10/01/2012    Procedure: COLONOSCOPY WITH PROPOFOL;  Surgeon: Gatha Mayer, MD;  Location: [facility];  Service: Endoscopy;  Laterality: N/A;  . Colonoscopy N/A 12/21/2012    Procedure: COLONOSCOPY;  Surgeon: Gatha Mayer, MD;  Location: [facility]COPY;  Service: Endoscopy;  Laterality: N/A;  . Hot hemostasis N/A 12/21/2012    Procedure: HOT HEMOSTASIS (ARGON PLASMA COAGULATION/BICAP);  Surgeon: Gatha Mayer, MD;  Location: [facility];  Service: Endoscopy;  Laterality: N/A;  . Right and left cardiac catheterization  July 2013    RAP: 22 mmHg, and RVP: 46/16 mmHg, RV EDP 18; L. BP  110/11 mmHg, LVEDP 20 mmHg;  PAP 45/29 mmHg (mean 37 mmHg); PCWP 29/22 mmHg - 26 mmHg; no significant coronary disease   History   Social History  . Marital Status:  Married    Spouse Name: N/A    Number of Children: 2  . Years of Education: HS   Occupational History  . Disability    Social History Main Topics  . Smoking status: Former Smoker -- 2.00 packs/day for 30 years    Types: Cigarettes    Quit date: 06/02/1994  . Smokeless tobacco: Never Used  . Alcohol Use: No  . Drug Use: No  . Sexual Activity: No   Other Topics Concern  . None   Social History Narrative   She is a married mother of 2, grandmother of 26, great-grandmother of 2. She is with her husband here today. She does not really exercise as she is on home oxygen and has baseline dyspnea.    Does not smoke. Does not drink. She quit smoking somewhere between 10-15 years ago. She is now trying to work on picking up her activity level. She is trying to I think more than likely get back into pulmonary rehabilitation potentially.       Daily caffeine.    Family History  Problem Relation Age of Onset  . Heart disease Mother   . Stroke Mother   . Heart disease Father   . Heart disease Brother   . Stroke Brother   . Skin cancer Brother   . Colon cancer Neg Hx   . Colon polyps Sister   . Diabetes Sister   . Diabetes Brother   . Irritable bowel syndrome Daughter     Review of Systems As above,    Objective:   Physical Exam General:  NAD, on O2 Eyes:   anicteric Lungs:  clear Heart:  S1S2 no rubs, murmurs or gallops  Data Reviewed:  Hospital notes Lab Results  Component Value Date   WBC 10.2 07/13/2013   HGB 8.4* 07/13/2013   HCT 28.7* 07/13/2013   MCV 72.1* 07/13/2013   PLT 275 07/13/2013      Assessment & Plan:  Sigmoid tubular adenoma with high-grade dysplasia Repeat sigmoidoscopy/colonoscopy top assess status - have held on surgery due to risk/benefit ratio without Ca dx and her co-morbidities - we  both agree time to reassess and see what is next as it has not been removable via scope Will need to hold Xarelto - check with cardiology Ellyn Hack) MAC MiraLax prep The risks and benefits as well as alternatives of endoscopic procedure(s) have been discussed and reviewed. All questions answered. The patient agrees to proceed. Stroke risk off Xarelto reviewed   PAF (paroxysmal atrial fibrillation) Best to hold Xarelto prior to colonoscopy - will clear with cardiology  Anemia - check CBC WU:XLKG, Gwyndolyn Saxon, MD, Glenetta Hew, MD

## 2013-11-17 NOTE — Anesthesia Postprocedure Evaluation (Signed)
Anesthesia Post Note  Patient: Destiny Shelton  Procedure(s) Performed: Procedure(s) (LRB): COLONOSCOPY (N/A)  Anesthesia type: MAC  Patient location: PACU  Post pain: Pain level controlled  Post assessment: Post-op Vital signs reviewed  Last Vitals: BP 113/55  Pulse 108  Temp(Src) 36.8 C (Oral)  Resp 26  Ht 5\' 5"  (1.651 m)  Wt 225 lb (102.059 kg)  BMI 37.44 kg/m2  SpO2 90%  Post vital signs: Reviewed  Level of consciousness: awake  Complications: No apparent anesthesia complications

## 2013-11-17 NOTE — Anesthesia Preprocedure Evaluation (Signed)
Anesthesia Evaluation  Patient identified by MRN, date of birth, ID band Patient awake    Reviewed: Allergy & Precautions, H&P , NPO status , Patient's Chart, lab work & pertinent test results  History of Anesthesia Complications Negative for: history of anesthetic complications  Airway Mallampati: II TM Distance: >3 FB Neck ROM: Full    Dental  (+) Poor Dentition, Partial Upper, Dental Advisory Given, Missing,    Pulmonary shortness of breath, at rest, lying and Long-Term Oxygen Therapy, asthma , sleep apnea , pneumonia -, COPD COPD inhaler and oxygen dependent, former smoker,    + decreased breath sounds+ wheezing      Cardiovascular hypertension, Pt. on medications + Peripheral Vascular Disease + dysrhythmias Atrial Fibrillation Rhythm:Regular Rate:Normal     Neuro/Psych  Headaches, PSYCHIATRIC DISORDERS Anxiety Depression  Neuromuscular disease    GI/Hepatic GERD-  Medicated and Controlled,  Endo/Other  diabetes, Well Controlled, Type 2, Insulin Dependent  Renal/GU Renal disease     Musculoskeletal  (+) Fibromyalgia -  Abdominal   Peds  Hematology  (+) anemia ,   Anesthesia Other Findings H/O Pulmonary Hypertension  Reproductive/Obstetrics                           Anesthesia Physical  Anesthesia Plan  ASA: III  Anesthesia Plan: MAC   Post-op Pain Management:    Induction:   Airway Management Planned: Simple Face Mask  Additional Equipment:   Intra-op Plan:   Post-operative Plan:   Informed Consent:   Dental advisory given  Plan Discussed with: CRNA  Anesthesia Plan Comments:         Anesthesia Quick Evaluation

## 2013-11-17 NOTE — Interval H&P Note (Signed)
History and Physical Interval Note:  11/17/2013 11:02 AM  Destiny Shelton  has presented today for surgery, with the diagnosis of polyp, sigmoid colon  The various methods of treatment have been discussed with the patient and family. After consideration of risks, benefits and other options for treatment, the patient has consented to  Procedure(s): COLONOSCOPY (N/A) as a surgical intervention .  The patient's history has been reviewed, patient examined, no change in status, stable for surgery.  I have reviewed the patient's chart and labs.  Questions were answered to the patient's satisfaction.     Silvano Rusk

## 2013-11-17 NOTE — Transfer of Care (Signed)
Immediate Anesthesia Transfer of Care Note  Patient: Destiny Shelton  Procedure(s) Performed: Procedure(s): COLONOSCOPY (N/A)  Patient Location: Endo Recovery  Anesthesia Type:MAC  Level of Consciousness: Patient easily awoken, sedated, comfortable, cooperative, following commands, responds to stimulation.   Airway & Oxygen Therapy: Patient spontaneously breathing, ventilating well, oxygen via simple oxygen mask.  Post-op Assessment: Report given to PACU RN, vital signs reviewed and stable, moving all extremities.   Post vital signs: Reviewed and stable.  Complications: No apparent anesthesia complications

## 2013-11-17 NOTE — Op Note (Signed)
[facility] [place], [id number]   FLEXIBLE SIGMOIDOSCOPY PROCEDURE REPORT  PATIENT: Destiny Shelton, Destiny Shelton.  MR#: [id number] BIRTHDATE: [birth year] , 67  yrs. old GENDER: Female ENDOSCOPIST: Gatha Mayer, MD, [facility] PROCEDURE DATE:  11/17/2013 PROCEDURE:   Sigmoidoscopy with biopsy, Sigmoidoscopy with snare, and Sigmoidoscopy with ablation therapy ASA CLASS:   Class IV INDICATIONS:Follow-up of persistent sigmoid adenoma with high-grade dysplasia. MEDICATIONS: See Anesthesia Report.  DESCRIPTION OF PROCEDURE:   After the risks benefits and alternatives of the procedure were thoroughly explained, informed consent was obtained.  revealed no abnormalities of the rectum. The endoscope was introduced through the anus  and advanced to the descending colon , limited by No adverse events experienced.   The quality of the prep was good .  The instrument was then slowly withdrawn as the mucosa was fully examined.       1.  COLON FINDINGS: A sessile polyp measuring 1-1.5 cm in size was found in the sigmoid colon.  this is residual polyp - area marked by tattoos.Multiple biopsies were performed using cold forceps.  A polypectomy was performed using snare cautery.  The resection was incomplete and the polyp tissue was completely retrieved. 2.  A sessile polyp measuring 6 mm in size was found in the descending colon.  A polypectomy was performed with a cold snare. The resection was complete and the polyp tissue was completely retrieved. 3.  Severe diverticulosis was noted. 4.  The colon mucosa was otherwise normal. Retroflexion was not performed.    The scope was then withdrawn from the patient and the procedure terminated.  COMPLICATIONS: There were no complications.  ENDOSCOPIC IMPRESSION: 1.   Sessile polyp measuring 1-1.5 cm in size was found in the sigmoid colon (residual polyp); multiple biopsies were performed using cold forceps; polypectomy was  performed using snare cautery 2.   Sessile polyp measuring 6 mm in size was found in the descending colon; polypectomy was performed with a cold snare 3.   Severe diverticulosis was noted 4.   The colon mucosa was otherwise normal - good prep  RECOMMENDATIONS: restart Xarelto in 2 days I will call with results/plans   eSigned:  Gatha Mayer, MD, [facility] 11/17/2013 12:15 PM   CC:W.  Lutricia Feil, MD, Leighton Ruff, MD and  The Patient

## 2013-11-21 ENCOUNTER — Encounter (HOSPITAL_COMMUNITY): Payer: Self-pay | Admitting: Internal Medicine

## 2013-11-22 ENCOUNTER — Encounter: Payer: Self-pay | Admitting: Internal Medicine

## 2013-11-22 NOTE — Progress Notes (Signed)
Quick Note:  Please call her ans let her know no signs of cancer - just benign polyp - this is great news Needs recall for OV in 4 months please ______

## 2013-12-06 ENCOUNTER — Other Ambulatory Visit: Payer: Self-pay | Admitting: Pulmonary Disease

## 2013-12-06 DIAGNOSIS — E1149 Type 2 diabetes mellitus with other diabetic neurological complication: Secondary | ICD-10-CM | POA: Diagnosis not present

## 2013-12-06 DIAGNOSIS — I10 Essential (primary) hypertension: Secondary | ICD-10-CM | POA: Diagnosis not present

## 2013-12-06 DIAGNOSIS — G609 Hereditary and idiopathic neuropathy, unspecified: Secondary | ICD-10-CM | POA: Diagnosis not present

## 2013-12-06 DIAGNOSIS — E785 Hyperlipidemia, unspecified: Secondary | ICD-10-CM | POA: Diagnosis not present

## 2013-12-06 DIAGNOSIS — J441 Chronic obstructive pulmonary disease with (acute) exacerbation: Secondary | ICD-10-CM | POA: Diagnosis not present

## 2013-12-06 DIAGNOSIS — I4891 Unspecified atrial fibrillation: Secondary | ICD-10-CM | POA: Diagnosis not present

## 2013-12-06 DIAGNOSIS — I509 Heart failure, unspecified: Secondary | ICD-10-CM | POA: Diagnosis not present

## 2013-12-06 DIAGNOSIS — J449 Chronic obstructive pulmonary disease, unspecified: Secondary | ICD-10-CM | POA: Diagnosis not present

## 2013-12-13 ENCOUNTER — Encounter: Payer: Self-pay | Admitting: Pulmonary Disease

## 2013-12-13 ENCOUNTER — Ambulatory Visit (INDEPENDENT_AMBULATORY_CARE_PROVIDER_SITE_OTHER): Payer: Medicare Other | Admitting: Pulmonary Disease

## 2013-12-13 VITALS — BP 124/74 | HR 111 | Temp 98.4°F | Ht 65.0 in | Wt 228.6 lb

## 2013-12-13 DIAGNOSIS — J9611 Chronic respiratory failure with hypoxia: Secondary | ICD-10-CM

## 2013-12-13 DIAGNOSIS — J9610 Chronic respiratory failure, unspecified whether with hypoxia or hypercapnia: Secondary | ICD-10-CM

## 2013-12-13 DIAGNOSIS — J209 Acute bronchitis, unspecified: Secondary | ICD-10-CM | POA: Diagnosis not present

## 2013-12-13 DIAGNOSIS — R0902 Hypoxemia: Secondary | ICD-10-CM | POA: Diagnosis not present

## 2013-12-13 DIAGNOSIS — J438 Other emphysema: Secondary | ICD-10-CM

## 2013-12-13 DIAGNOSIS — J439 Emphysema, unspecified: Secondary | ICD-10-CM

## 2013-12-13 MED ORDER — LEVOFLOXACIN 750 MG PO TABS: 750.0000 mg | ORAL_TABLET | Freq: Every day | ORAL | Status: DC

## 2013-12-13 NOTE — Assessment & Plan Note (Signed)
The patient has chronic respiratory failure that is multifactorial. She has mild COPD by PFTs, diastolic heart failure with a history of an elevated wedge pressure at right heart cath, and probably has a shunt that has yet to be characterized. I have asked her to continue on her oxygen, and to work aggressively on weight loss.

## 2013-12-13 NOTE — Patient Instructions (Signed)
Will treat with levaquin 750mg  one a day for 5 days to treat your bronchitis. Increase your oxygen level to 5 liters with heavy exertional activities, but decrease to 4liters once you are doing better.  Work on weight loss followup with me again in 74mos.

## 2013-12-13 NOTE — Assessment & Plan Note (Signed)
The patient has mild to moderate airflow obstruction by PFTs, and is on a very good regimen for this. There is nothing to indicate acute bronchospasm on today's exam.

## 2013-12-13 NOTE — Assessment & Plan Note (Signed)
The patient is having persistent chest congestion with purulent mucus and increased shortness of breath despite being treated with a Z-Pak. I suspect that it is likely that she is colonized with more resistant organisms, and we'll therefore broaden her antibiotic coverage. Will try her on Levaquin, and she is to let us know if this does not help.

## 2013-12-13 NOTE — Progress Notes (Signed)
   Subjective:    Patient ID: Destiny Shelton, female    DOB: 1946-03-19, 68 y.o.   MRN: [id number]  HPI Patient comes in today for followup but also an acute sick visit. She has known chronic respiratory failure that is multifactorial, and secondary to mild to moderate COPD, obesity, diastolic heart failure with history of elevated wedge pressure, and also an abnormal transcranial Doppler study that suggest a shunt of unknown origin. She had been staying at her usual baseline until approximately 2 weeks ago, when she began to develop chest congestion, cough with purulent mucus, as well as sinus pressure and postnasal drip. She also noted increasing shortness of breath. She was treated with a Z-Pak by her primary physician, but has seen no improvement in her symptoms. She has not had any fevers, chills, or sweats. Her oxygen level today walking from the waiting room is below her usual baseline on 4 L.   Review of Systems  Constitutional: Negative for fever and unexpected weight change.  HENT: Positive for congestion and sinus pressure. Negative for dental problem, ear pain, nosebleeds, postnasal drip, rhinorrhea, sneezing, sore throat and trouble swallowing.   Eyes: Negative for redness and itching.  Respiratory: Positive for cough, chest tightness, shortness of breath and wheezing.   Cardiovascular: Negative for palpitations and leg swelling.  Gastrointestinal: Negative for nausea and vomiting.  Genitourinary: Negative for dysuria.  Musculoskeletal: Negative for joint swelling.  Skin: Negative for rash.  Neurological: Negative for headaches.  Hematological: Does not bruise/bleed easily.  Psychiatric/Behavioral: Negative for dysphoric mood. The patient is not nervous/anxious.        Objective:   Physical Exam Obese female in no acute distress Nose without purulence or discharge noted Neck without lymphadenopathy or thyromegaly Chest with mildly decreased breath sounds, no crackles or  wheezes Cardiac exam with mild tachycardia but regular, no definite murmurs. Lower extremities with mild edema, no cyanosis Alert and oriented, moves all 4 extremities.       Assessment & Plan:

## 2013-12-20 IMAGING — CR DG CHEST 2V
2 series · 2 of 2 positions shown · non-contrast
Comparison: Chest radiograph 09/09/2007

CLINICAL DATA: Hypoxia, short of breath, tightness in chest

CHEST - 2 VIEW

[view not recorded (1 of 2)]
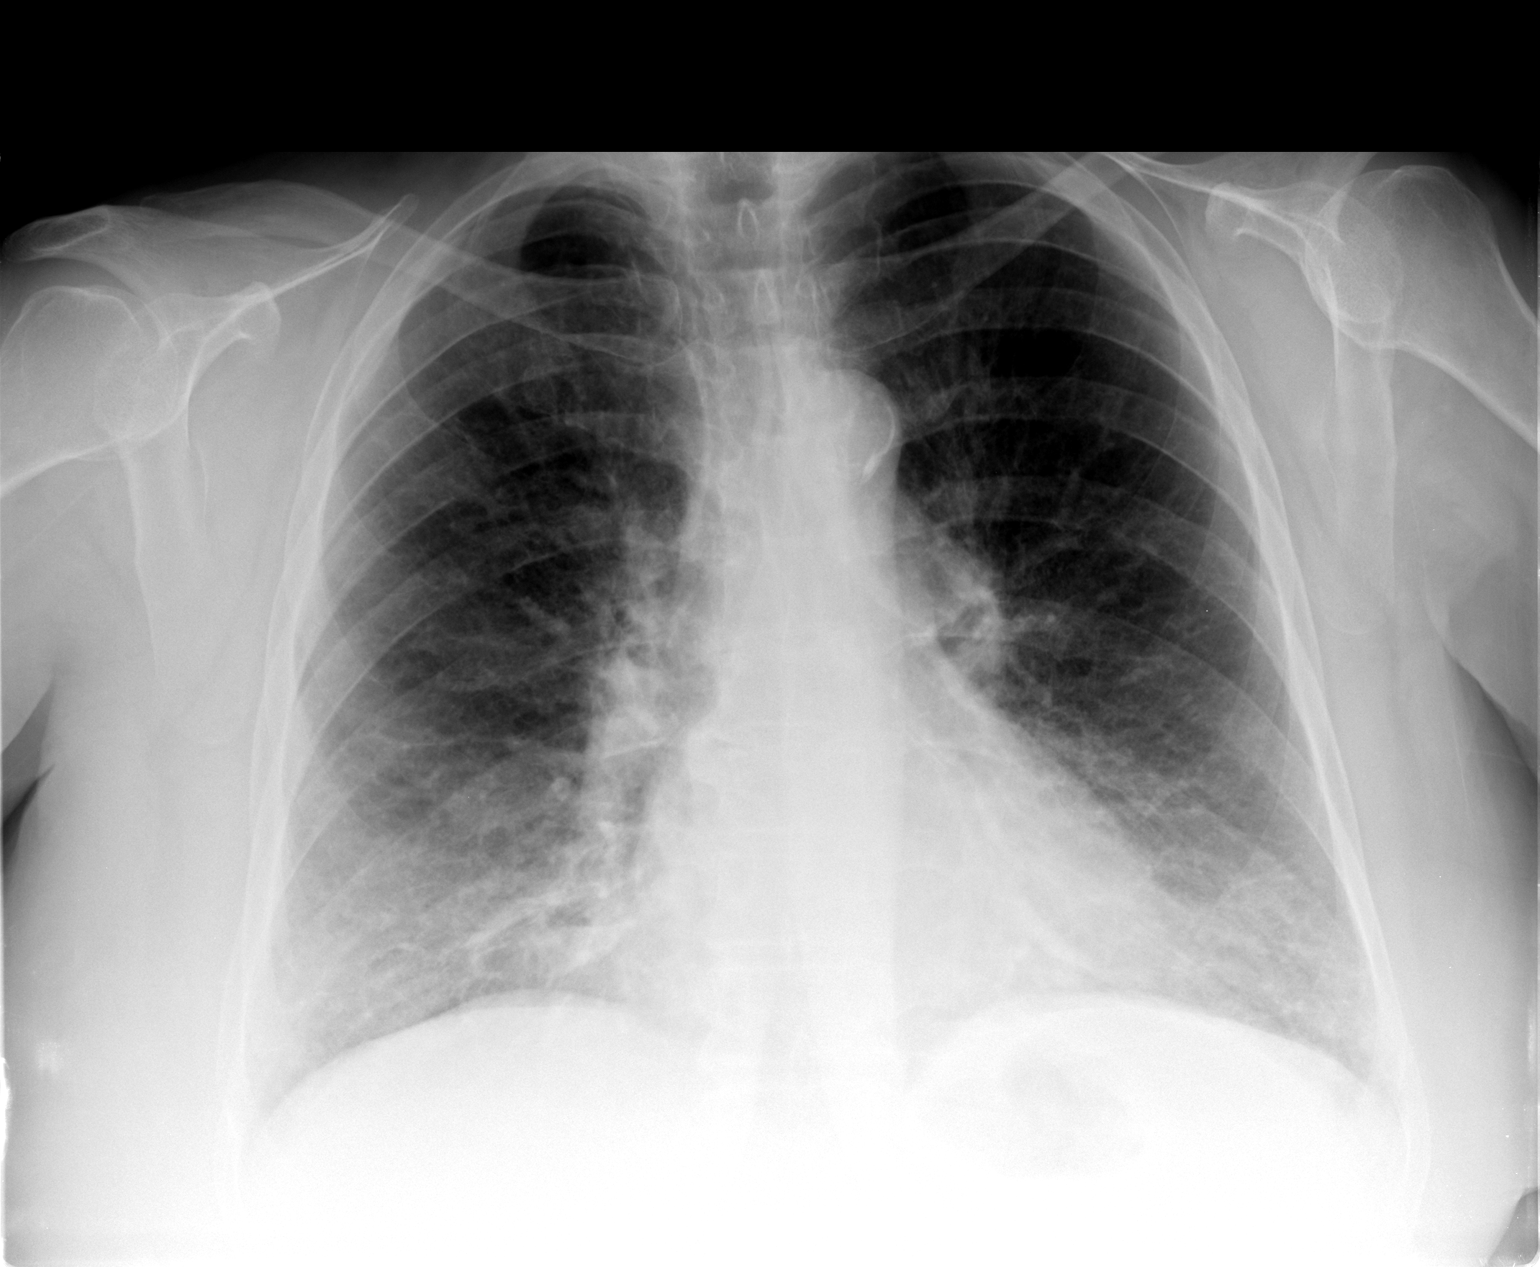

[view not recorded (2 of 2)]
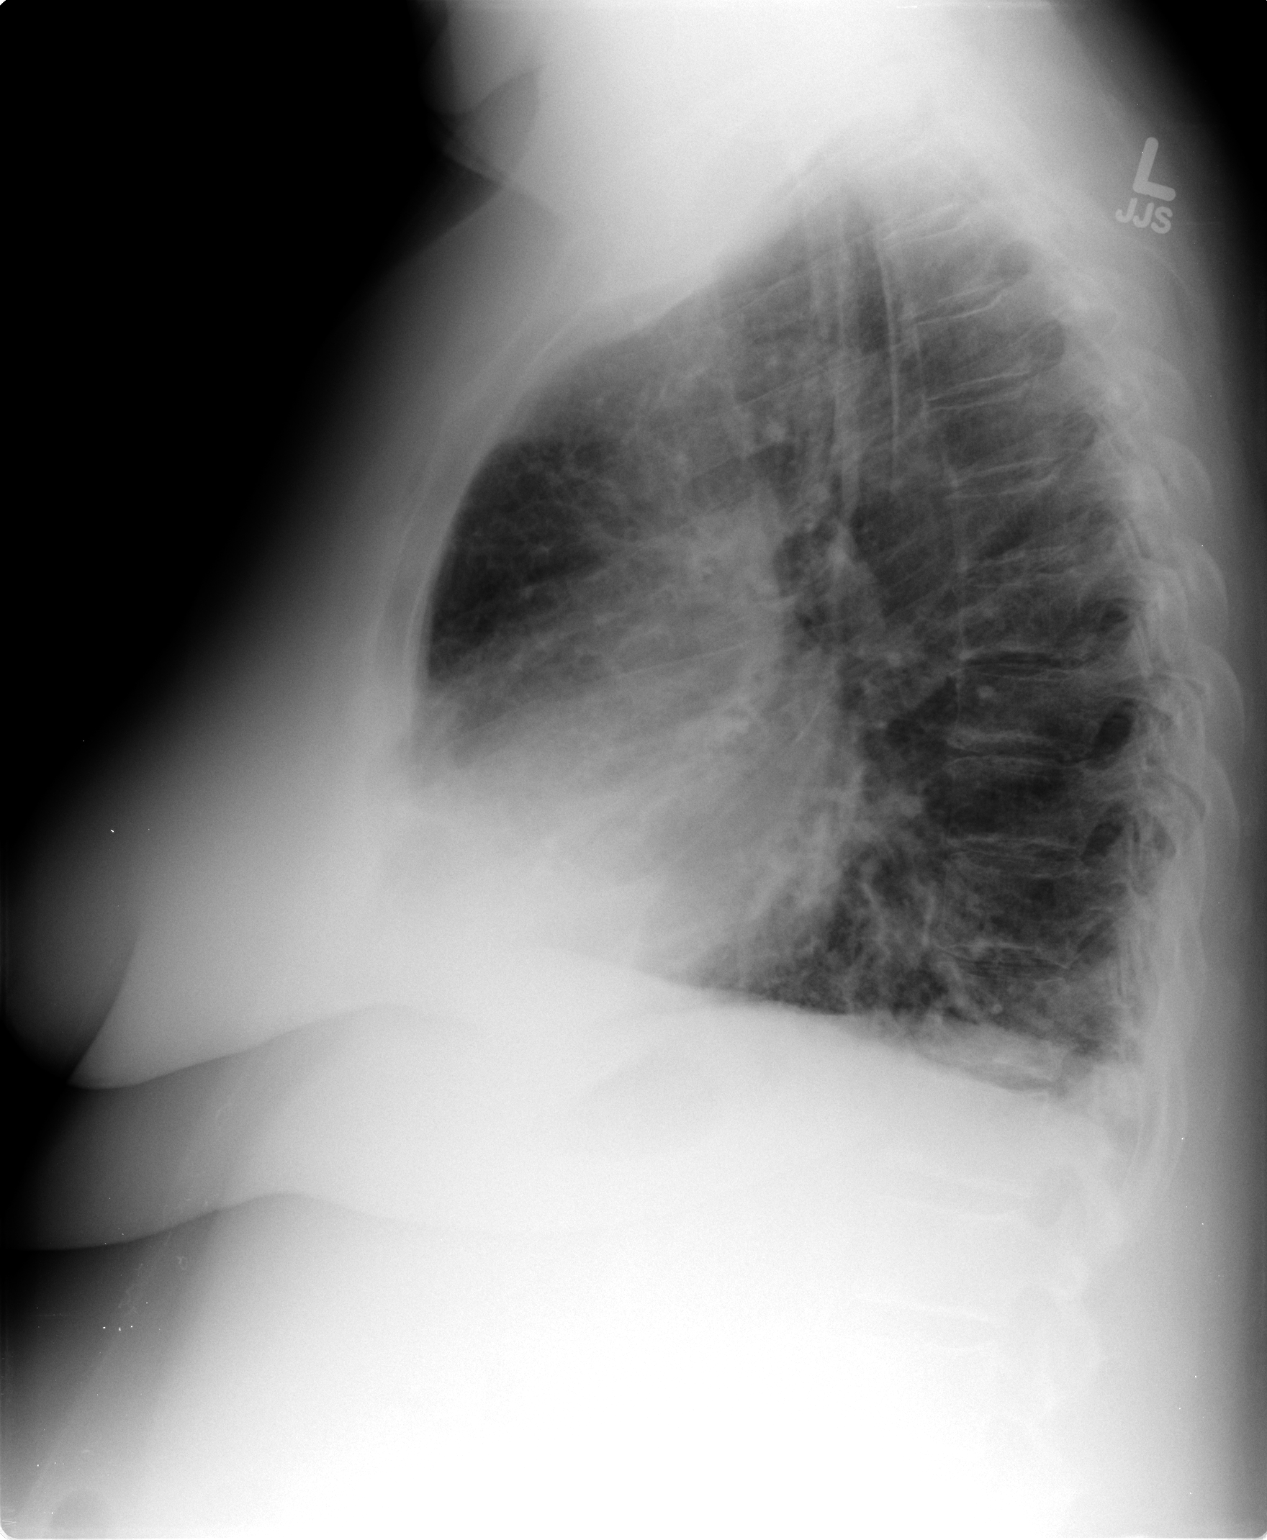

[2 of 2 positions shown; findings below may reference images not displayed]

FINDINGS: Normal cardiac silhouette.  There is bibasilar
atelectasis and scarring which is unchanged from exam 4 years
prior.  Lungs are hyperinflated with flattened hemidiaphragms.

There is questionable round nodule adjacent to the right hilum
measuring 11 mm.  This is not clearly seen on prior.
IMPRESSION: 1..  Stable bibasilar atelectasis and scarring.
2.  No acute cardiopulmonary findings.
3.  Hyperinflated lungs consistent with emphysema.
4.  Questionable nodular density adjacent to right hilum.  While
this may represent a rib end, cannot exclude a new pulmonary
nodule.  Recommend CT thorax with contrast further evaluation.

This was made a call report.

## 2014-01-02 ENCOUNTER — Other Ambulatory Visit: Payer: Self-pay | Admitting: Pulmonary Disease

## 2014-01-05 DIAGNOSIS — M810 Age-related osteoporosis without current pathological fracture: Secondary | ICD-10-CM | POA: Diagnosis not present

## 2014-01-05 DIAGNOSIS — Z79899 Other long term (current) drug therapy: Secondary | ICD-10-CM | POA: Diagnosis not present

## 2014-01-05 DIAGNOSIS — Z6836 Body mass index (BMI) 36.0-36.9, adult: Secondary | ICD-10-CM | POA: Diagnosis not present

## 2014-01-07 ENCOUNTER — Encounter: Payer: Self-pay | Admitting: Internal Medicine

## 2014-01-26 DIAGNOSIS — R82998 Other abnormal findings in urine: Secondary | ICD-10-CM | POA: Diagnosis not present

## 2014-01-26 DIAGNOSIS — Z6836 Body mass index (BMI) 36.0-36.9, adult: Secondary | ICD-10-CM | POA: Diagnosis not present

## 2014-01-26 DIAGNOSIS — M545 Low back pain, unspecified: Secondary | ICD-10-CM | POA: Diagnosis not present

## 2014-01-26 DIAGNOSIS — M549 Dorsalgia, unspecified: Secondary | ICD-10-CM | POA: Diagnosis not present

## 2014-01-27 ENCOUNTER — Ambulatory Visit (HOSPITAL_COMMUNITY): Admission: RE | Admit: 2014-01-27 | Payer: Medicare Other | Source: Ambulatory Visit

## 2014-01-30 ENCOUNTER — Encounter: Payer: Self-pay | Admitting: Cardiology

## 2014-01-30 ENCOUNTER — Ambulatory Visit (INDEPENDENT_AMBULATORY_CARE_PROVIDER_SITE_OTHER): Payer: Medicare Other | Admitting: Cardiology

## 2014-01-30 ENCOUNTER — Other Ambulatory Visit: Payer: Self-pay | Admitting: Pulmonary Disease

## 2014-01-30 VITALS — BP 140/70 | HR 108 | Ht 65.0 in | Wt 228.5 lb

## 2014-01-30 DIAGNOSIS — IMO0001 Reserved for inherently not codable concepts without codable children: Secondary | ICD-10-CM

## 2014-01-30 DIAGNOSIS — R0602 Shortness of breath: Secondary | ICD-10-CM

## 2014-01-30 DIAGNOSIS — E1149 Type 2 diabetes mellitus with other diabetic neurological complication: Secondary | ICD-10-CM

## 2014-01-30 DIAGNOSIS — I2789 Other specified pulmonary heart diseases: Secondary | ICD-10-CM

## 2014-01-30 DIAGNOSIS — I498 Other specified cardiac arrhythmias: Secondary | ICD-10-CM | POA: Diagnosis not present

## 2014-01-30 DIAGNOSIS — I4891 Unspecified atrial fibrillation: Secondary | ICD-10-CM

## 2014-01-30 DIAGNOSIS — I480 Paroxysmal atrial fibrillation: Secondary | ICD-10-CM

## 2014-01-30 DIAGNOSIS — I10 Essential (primary) hypertension: Secondary | ICD-10-CM

## 2014-01-30 DIAGNOSIS — R000 Tachycardia, unspecified: Secondary | ICD-10-CM

## 2014-01-30 DIAGNOSIS — I4711 Inappropriate sinus tachycardia, so stated: Secondary | ICD-10-CM

## 2014-01-30 DIAGNOSIS — I272 Pulmonary hypertension, unspecified: Secondary | ICD-10-CM

## 2014-01-30 DIAGNOSIS — I5032 Chronic diastolic (congestive) heart failure: Secondary | ICD-10-CM

## 2014-01-30 NOTE — Patient Instructions (Addendum)
Continue with current medications.  Your physician wants you to follow-up in 6 month Dr Ellyn Hack.30 min appointment  You will receive a reminder letter in the mail two months in advance. If you don't receive a letter, please call our office to schedule the follow-up appointment.

## 2014-02-01 ENCOUNTER — Encounter: Payer: Self-pay | Admitting: Cardiology

## 2014-02-01 ENCOUNTER — Other Ambulatory Visit (HOSPITAL_COMMUNITY): Payer: Self-pay | Admitting: Internal Medicine

## 2014-02-01 ENCOUNTER — Ambulatory Visit (HOSPITAL_COMMUNITY)
Admission: RE | Admit: 2014-02-01 | Discharge: 2014-02-01 | Disposition: A | Discharge status: Home / Self Care | Payer: Medicare Other | Source: Ambulatory Visit | Attending: Internal Medicine | Admitting: Internal Medicine

## 2014-02-01 DIAGNOSIS — M810 Age-related osteoporosis without current pathological fracture: Secondary | ICD-10-CM | POA: Insufficient documentation

## 2014-02-01 MED ORDER — SODIUM CHLORIDE 0.9 % IV SOLN
Freq: Once | INTRAVENOUS | Status: AC
  Administered 2014-02-01: 10:00:00 via INTRAVENOUS

## 2014-02-01 MED ORDER — ZOLEDRONIC ACID 5 MG/100ML IV SOLN
5.0000 mg | Freq: Once | INTRAVENOUS | Status: AC
  Administered 2014-02-01: 5 mg via INTRAVENOUS
  Filled 2014-02-01: qty 100

## 2014-02-01 NOTE — Assessment & Plan Note (Signed)
Thankfully no recurrent episodes. On verapamil for rate control. He would not tolerate A. fib if she goes into it,therefore will continue on anticoagulation just in case she needs cardioversion. Also for stroke prevention clearly. Can be held for GI procedures as was done recently.

## 2014-02-01 NOTE — Progress Notes (Signed)
PCP: Marton Redwood, MD  Clinic Note: Chief Complaint  Patient presents with  . 6 month visit    no chest pain , sob on exertion , no edema -- wears oxygen     HPI: Destiny Shelton is a 68 y.o. female with a Cardiovascular Problem List below who presents today for a six-month followup for paroxysmal A. fib, presents tachycardia, atrial tachycardia, chronic diastolic heart failure and hypertension along with oxygen requiring COPD.  Interval History: She presents today he for routine followup really no major cardiac complaints. She recently was treated for her bronchitis episode and is now currently on antibiotics for a UTI. She has been having some coughing not really all that productive but more cough than usual. A little more short of breath than usual but with the coughing episodes but not significant. It is usually K. she denies any chest tightness or pressure with rest or exertion. She is limited to how much exertion she can do and has chronic exertional dyspnea brain oxygen at home. She has a rapid resting heart rate but it does get them at home into the 80s according to her husband's report. She doesn't have a sense of the going fast or irregular. She was very symptomatic when she had atrial fibrillation in the past. She's not had any complaints of melena, hematochezia, hematuria, epistaxis. Relatively well-controlled swelling, chronic orthopnea sleeping in a chair. No notable PND as well as a stable dose Lasix. She is currently 80 mg twice a day on a sliding scale when necessary regimen. She has not had any syncope or near syncope, TIA/amaurosis fugax. No claudication.  Overall with the exception of her UTI and bronchitis she is stable from cardiac standpoint.  Past Medical History  Diagnosis Date  . Hypertension   . Diabetes mellitus type 2 with neurological manifestations     On Insulin  . Morbidly obese   . Hyperlipemia   . COPD (chronic obstructive pulmonary disease)    Requiring Home O2 at 4L  . Atrial tachycardia     Mostly Sinus Tachycardia  . PAF (paroxysmal atrial fibrillation)   . Inappropriate sinus tachycardia   . Community acquired pneumonia - Recent Admission (07/2013) 07/08/2013  . Sleep apnea     no cpap machine  02 at 4l/min all the time    Prior Cardiac Evaluation and Past Surgical History: Procedure Laterality Date  . Tee without cardioversion  03/24/2012    Procedure: TRANSESOPHAGEAL ECHOCARDIOGRAM (TEE);  Surgeon: Pixie Casino, MD;  Location: [facility] OR;  Service: Cardiovascular;  Laterality: N/A;  TEE with Bubble  . Right and left cardiac catheterization  July 2013    RAP: 22 mmHg, and RVP: 46/16 mmHg, RV EDP 18; L. BP 110/11 mmHg, LVEDP 20 mmHg;  PAP 45/29 mmHg (mean 37 mmHg); PCWP 29/22 mmHg - 26 mmHg; no significant coronary disease   MEDICATIONS AND ALLERGIES REVIEWED IN EPIC No Change in Social and Family History  ROS: A comprehensive Review of Systems - was performed Review of Systems  Constitutional: Negative for fever and chills.  HENT: Negative for nosebleeds.   Eyes: Negative for blurred vision and double vision.  Respiratory: Positive for cough, sputum production, shortness of breath and wheezing.   Cardiovascular:       Mild lower extremity edema. Chronic orthopnea as noted in history of present illness.  Gastrointestinal: Positive for constipation. Negative for blood in stool and melena.  Neurological: Positive for dizziness. Negative for sensory change, speech change, focal  weakness, loss of consciousness and headaches.       Intermittent dizziness with some orthostatic changes.  Endo/Heme/Allergies: Bruises/bleeds easily.  Psychiatric/Behavioral: Negative for depression. The patient is not nervous/anxious.   All other systems reviewed and are negative.  Wt Readings from Last 3 Encounters:  01/30/14 228 lb 8 oz (103.647 kg)  12/13/13 228 lb 9.6 oz (103.692 kg)  11/17/13 225 lb (102.059 kg)   PHYSICAL EXAM BP  140/70  Pulse 108  Ht 5\' 5"  (1.651 m)  Wt 228 lb 8 oz (103.647 kg)  BMI 38.02 kg/m2 General appearance: A&O x3; chronically ill-appearing obese woman who is in no acute distress. Wearing chronic home O2 from her concentrator. No increased work of breathing.  Neck: no carotid bruit, no JVD and supple, symmetrical, trachea midline  Lungs: Nonlabored,  But with excessive muscle use. . No true wheezes but there is scattered rhonchorous expirations bilaterally with mild right mid lung wet crackles. Occasional coughing Heart: Mildly tachycardic; Regular rhythm. Normal S1-S2, no M./R./G. Unable to palpate PMI due to body habitus.  Abdomen: soft, non-tender; bowel sounds normal; no masses, Unable to palpate HSM due to body habitus. No HJR  Extremities: edema Trace, no ulcers, gangrene or trophic changes and Mild spider veins, no significant varicosities  Pulses: 2+ and symmetric  Skin: Skin color, texture, turgor normal. No rashes or lesions  Neurologic: Grossly normal   Adult ECG Report  Rate: 108 ;  Rhythm: sinus tachycardia otherwise essentially normal EKG normal axis, intervals, duration and voltage.    Narrative Interpretation:  stable EKG   Recent Labs  none checked since May. Reviewed in Epic. :   ASSESSMENT / PLAN: Pulmonary hypertension Moderate pulmonary pressures partially related to lung disease but also related to pulmonary venous hypertension from left ventricular diastolic dysfunction. She is on a relatively high dose of standing Lasix which notably improved her symptoms. This admission the oxygen is the best therapeutic regimen. She is on a calcium channel blocker for what it's worth. Is also on chronic anticoagulation for her A. fib. Continue home oxygen and stressed importance of walking daily to increase her 6 minute walk time.  PAF (paroxysmal atrial fibrillation) Thankfully no recurrent episodes. On verapamil for rate control. He would not tolerate A. fib if she goes into  it,therefore will continue on anticoagulation just in case she needs cardioversion. Also for stroke prevention clearly. Can be held for GI procedures as was done recently.   Inappropriate sinus tachycardia Probably related to body habitus, obesity and off she required lung disease. As best I can control her rate with verapamil she says at home is usually in the 80s to 90s which is goal rate. When I checked her heart rate in the visit it was 98 beats per minute.  Chronic diastolic heart failure of unknown etiology As above. Is on ARB for afterload reduction. If her pressures increase more at baseline would need additional afterload reduction either if potentially increasing the Avapro dose versus adding hydralazine/nitrate.  HYPERTENSION Usually well controlled. He is currently on Avapro. We'll need to monitor to see if additional therapy is required as her blood pressure is borderline today.  Obesity, Class II, BMI 35-39.9, with comorbidity Basically maintaining stable weight. I think with her inability to exercise lose weight really very difficult. But she does need to continue to monitor her dietary habits. Her husband is doing the cooking and therefore I'm not sure how she is being able to maintain her weight is up was  losing weight since he is clearly far from obese.    Orders Placed This Encounter  Procedures  . EKG 12-Lead   No orders of the defined types were placed in this encounter.    Followup:  6  months  DAVID W. Ellyn Hack, M.D., M.S. Interventional Cardiologist [facility]

## 2014-02-01 NOTE — Assessment & Plan Note (Signed)
Basically maintaining stable weight. I think with her inability to exercise lose weight really very difficult. But she does need to continue to monitor her dietary habits. Her husband is doing the cooking and therefore I'm not sure how she is being able to maintain her weight is up was losing weight since he is clearly far from obese.

## 2014-02-01 NOTE — Assessment & Plan Note (Signed)
Probably related to body habitus, obesity and off she required lung disease. As best I can control her rate with verapamil she says at home is usually in the 80s to 90s which is goal rate. When I checked her heart rate in the visit it was 98 beats per minute.

## 2014-02-01 NOTE — Progress Notes (Signed)
Pt received Reclast infusion without difficulty.  Infusion uneventful.  Pt has no complaints.  Pt discharged to home with husband.

## 2014-02-01 NOTE — Assessment & Plan Note (Signed)
As above. Is on ARB for afterload reduction. If her pressures increase more at baseline would need additional afterload reduction either if potentially increasing the Avapro dose versus adding hydralazine/nitrate.

## 2014-02-01 NOTE — Assessment & Plan Note (Signed)
Usually well controlled. He is currently on Avapro. We'll need to monitor to see if additional therapy is required as her blood pressure is borderline today.

## 2014-02-01 NOTE — Assessment & Plan Note (Signed)
Moderate pulmonary pressures partially related to lung disease but also related to pulmonary venous hypertension from left ventricular diastolic dysfunction. She is on a relatively high dose of standing Lasix which notably improved her symptoms. This admission the oxygen is the best therapeutic regimen. She is on a calcium channel blocker for what it's worth. Is also on chronic anticoagulation for her A. fib. Continue home oxygen and stressed importance of walking daily to increase her 6 minute walk time.

## 2014-02-23 DIAGNOSIS — Z23 Encounter for immunization: Secondary | ICD-10-CM | POA: Diagnosis not present

## 2014-03-01 ENCOUNTER — Other Ambulatory Visit: Payer: Self-pay | Admitting: Pulmonary Disease

## 2014-03-24 DIAGNOSIS — Z1389 Encounter for screening for other disorder: Secondary | ICD-10-CM | POA: Diagnosis not present

## 2014-03-24 DIAGNOSIS — I10 Essential (primary) hypertension: Secondary | ICD-10-CM | POA: Diagnosis not present

## 2014-03-24 DIAGNOSIS — E1149 Type 2 diabetes mellitus with other diabetic neurological complication: Secondary | ICD-10-CM | POA: Diagnosis not present

## 2014-03-24 DIAGNOSIS — J449 Chronic obstructive pulmonary disease, unspecified: Secondary | ICD-10-CM | POA: Diagnosis not present

## 2014-03-24 DIAGNOSIS — R8299 Other abnormal findings in urine: Secondary | ICD-10-CM | POA: Diagnosis not present

## 2014-03-24 DIAGNOSIS — I509 Heart failure, unspecified: Secondary | ICD-10-CM | POA: Diagnosis not present

## 2014-03-24 DIAGNOSIS — I4891 Unspecified atrial fibrillation: Secondary | ICD-10-CM | POA: Diagnosis not present

## 2014-03-24 DIAGNOSIS — E785 Hyperlipidemia, unspecified: Secondary | ICD-10-CM | POA: Diagnosis not present

## 2014-03-24 DIAGNOSIS — G629 Polyneuropathy, unspecified: Secondary | ICD-10-CM | POA: Diagnosis not present

## 2014-03-24 DIAGNOSIS — I272 Other secondary pulmonary hypertension: Secondary | ICD-10-CM | POA: Diagnosis not present

## 2014-03-27 ENCOUNTER — Other Ambulatory Visit: Payer: Self-pay | Admitting: Pulmonary Disease

## 2014-04-03 ENCOUNTER — Telehealth: Payer: Self-pay | Admitting: Pulmonary Disease

## 2014-04-03 MED ORDER — TIOTROPIUM BROMIDE MONOHYDRATE 18 MCG IN CAPS: 18.0000 ug | ORAL_CAPSULE | Freq: Every day | RESPIRATORY_TRACT | Status: DC

## 2014-04-03 NOTE — Telephone Encounter (Signed)
Called pt. Aware RX for spiriva has been sent in. Nothing further needed

## 2014-04-12 ENCOUNTER — Other Ambulatory Visit: Payer: Self-pay | Admitting: Pulmonary Disease

## 2014-04-19 DIAGNOSIS — E119 Type 2 diabetes mellitus without complications: Secondary | ICD-10-CM | POA: Diagnosis not present

## 2014-04-19 DIAGNOSIS — H3589 Other specified retinal disorders: Secondary | ICD-10-CM | POA: Diagnosis not present

## 2014-04-19 DIAGNOSIS — H5340 Unspecified visual field defects: Secondary | ICD-10-CM | POA: Diagnosis not present

## 2014-04-19 DIAGNOSIS — I10 Essential (primary) hypertension: Secondary | ICD-10-CM | POA: Diagnosis not present

## 2014-04-19 DIAGNOSIS — H04123 Dry eye syndrome of bilateral lacrimal glands: Secondary | ICD-10-CM | POA: Diagnosis not present

## 2014-04-19 DIAGNOSIS — Z961 Presence of intraocular lens: Secondary | ICD-10-CM | POA: Diagnosis not present

## 2014-05-02 ENCOUNTER — Encounter: Payer: Self-pay | Admitting: Internal Medicine

## 2014-05-02 ENCOUNTER — Ambulatory Visit (INDEPENDENT_AMBULATORY_CARE_PROVIDER_SITE_OTHER): Payer: Medicare Other | Admitting: Internal Medicine

## 2014-05-02 VITALS — BP 104/50 | HR 100 | Ht 65.0 in | Wt 228.2 lb

## 2014-05-02 DIAGNOSIS — K635 Polyp of colon: Secondary | ICD-10-CM

## 2014-05-02 NOTE — Patient Instructions (Signed)
We will see you at your follow up visit with Dr. Carlean Purl on March 21st at 11:00am.    I appreciate the opportunity to care for you.

## 2014-05-02 NOTE — Progress Notes (Signed)
   Subjective:    Patient ID: Destiny Shelton, female    DOB: Dec 05, 1945, 68 y.o.   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 This past removal did not show any high-grade dysplasia. She has no c/o today  Medications, allergies, past medical history, past surgical history, family history and social history are reviewed and updated in the EMR.  Review of Systems As above - still on chronic oxygen with chronic dyspnea    Objective:   Physical Exam Obese, NAD    Assessment & Plan:  Colon polyp  I will see her back in March - scheduled today - and we anticipate repeat colonoscopy to remove/ablate polyp further (off Xarelto) and look for additional polyps as she had numerous polyps in 2014.  VV:KPQA, Gwyndolyn Saxon, MD

## 2014-05-11 ENCOUNTER — Other Ambulatory Visit: Payer: Self-pay | Admitting: Pulmonary Disease

## 2014-05-11 ENCOUNTER — Encounter (HOSPITAL_COMMUNITY): Payer: Self-pay | Admitting: Cardiology

## 2014-06-06 IMAGING — CR DG ABD PORTABLE 1V
1 series · 1 of 1 positions shown · non-contrast
Comparison: None.

CLINICAL DATA: Left-sided back pain.  Pyelonephritis.

PORTABLE ABDOMEN - 1 VIEW

[AP]
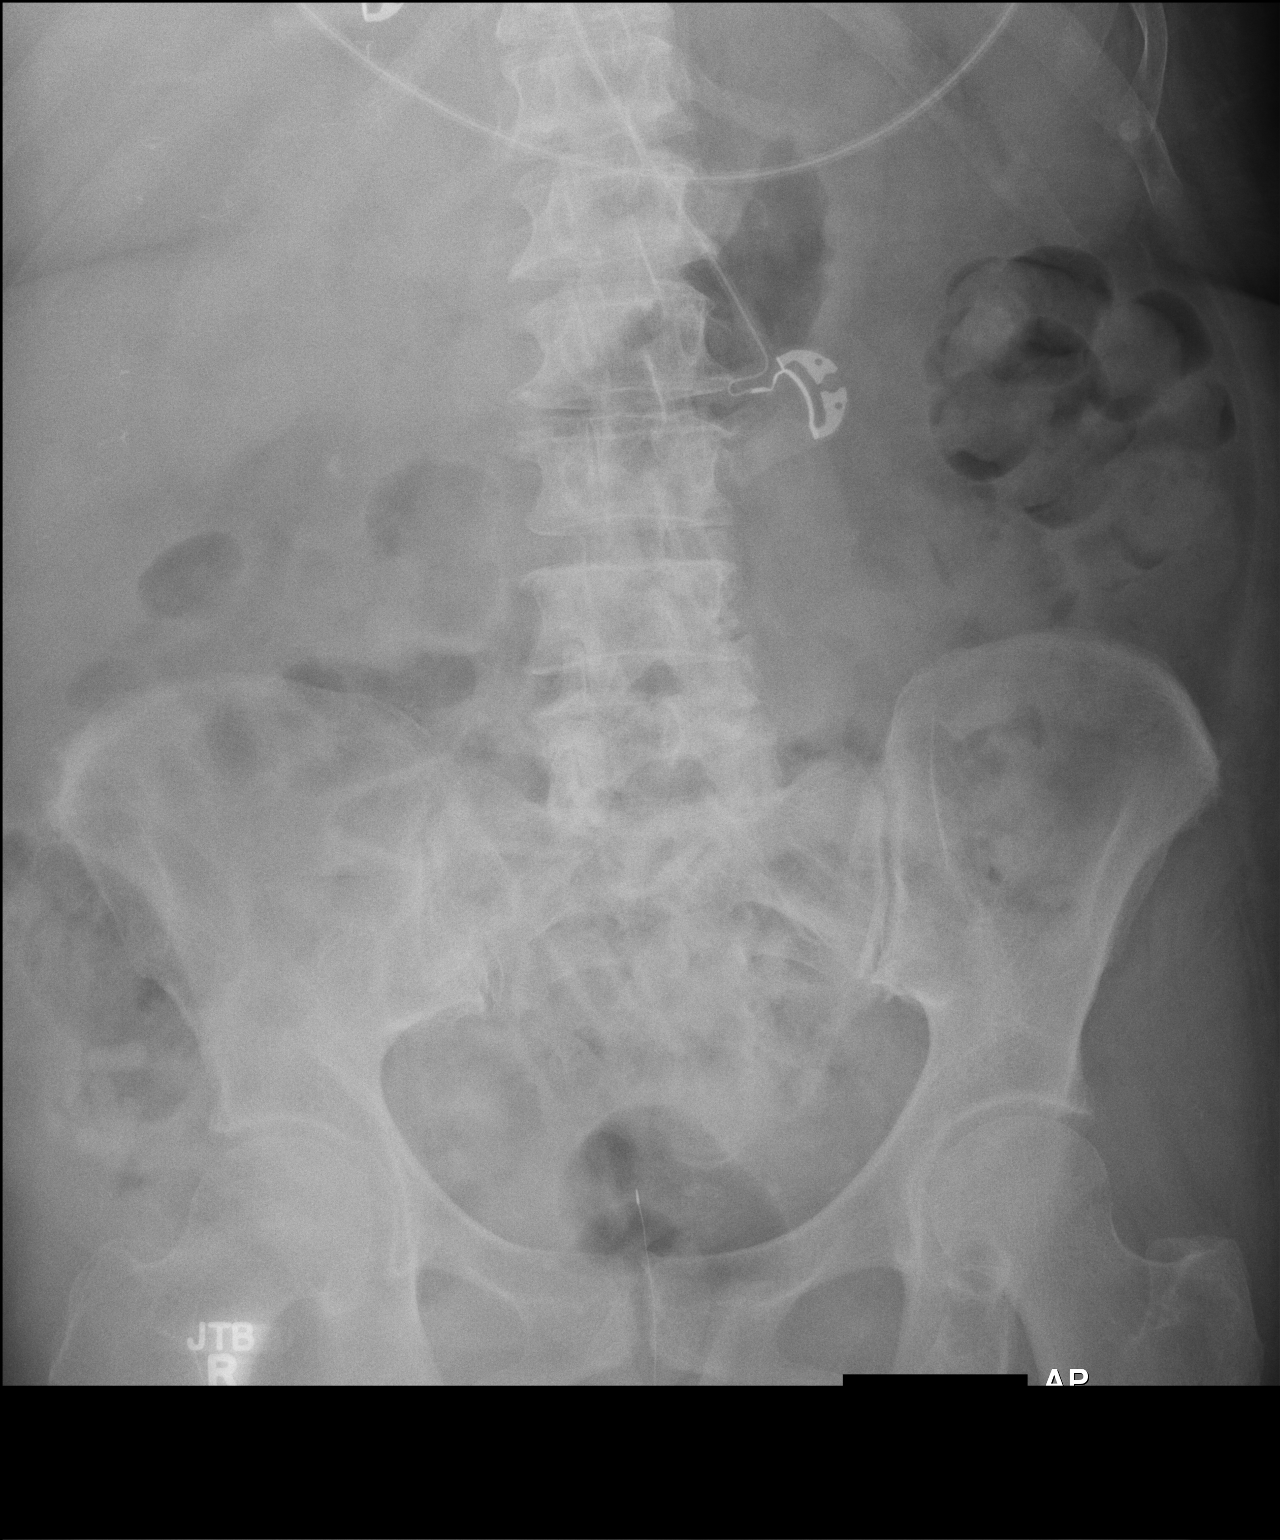

[1 of 1 positions shown; findings below may reference images not displayed]

FINDINGS: Curvilinear subcentimeter calcification roughly overlying
the lower pole of the right kidney could represent a renal
calculus.  No definite left renal or ureteral calculi are seen.
There is mild degenerative change in the spine.  A rectal
temperature probe is present.  There is no bowel obstruction.  Free
air cannot be assessed on portable AP supine film.
IMPRESSION: No definite left renal calculi.  CT urogram more sensitive.
Subcentimeter calcification on the right could be renal in origin.
Correlate clinically.

## 2014-06-06 IMAGING — US US RENAL PORT
1 series · 14 of 25 positions shown · non-contrast
Comparison: None.

CLINICAL DATA: Hypertensive diabetic with fever and back pain.
Question abscess.

RENAL/URINARY TRACT ULTRASOUND COMPLETE

[Series 1: us renal port · 0.28mm/px · 14 of 32 slices shown]
[im 1/32]
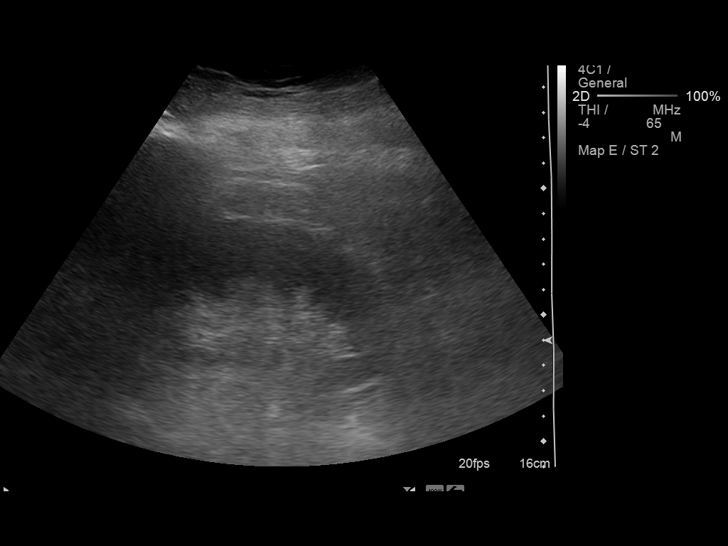
[im 3/32]
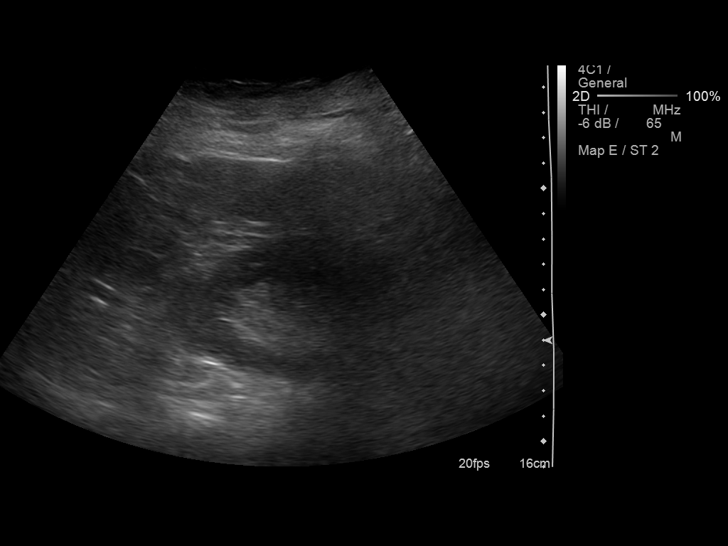
[im 6/32]
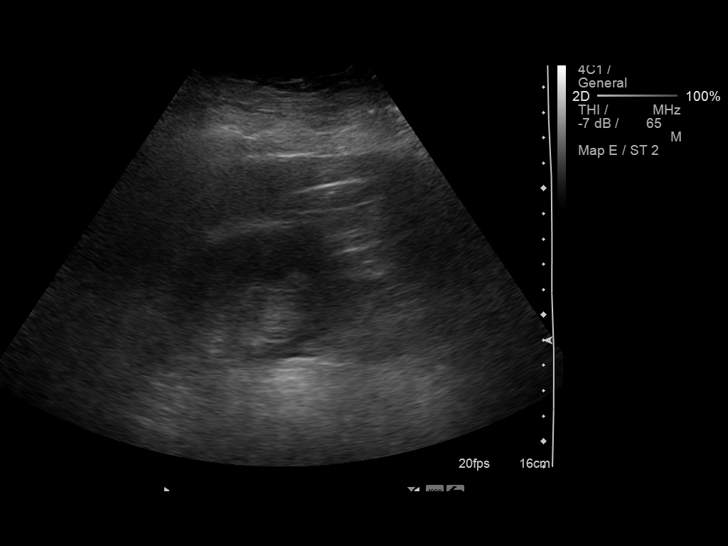
[im 8/32]
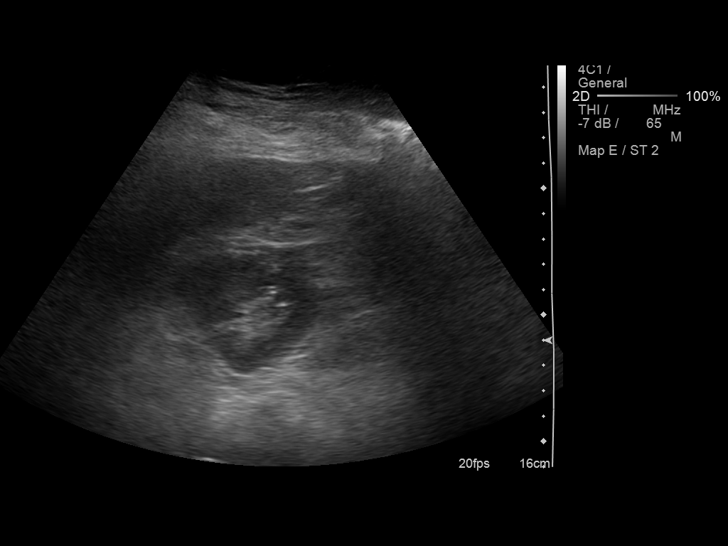
[im 11/32]
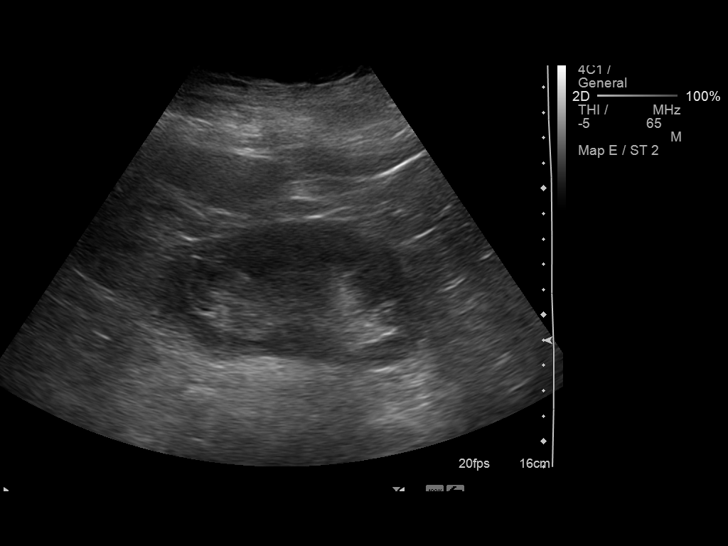
[im 12/32]
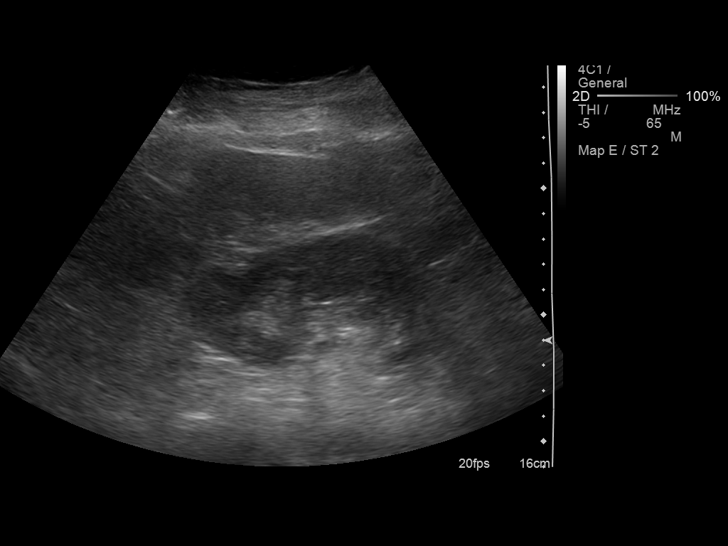
[im 15/32]
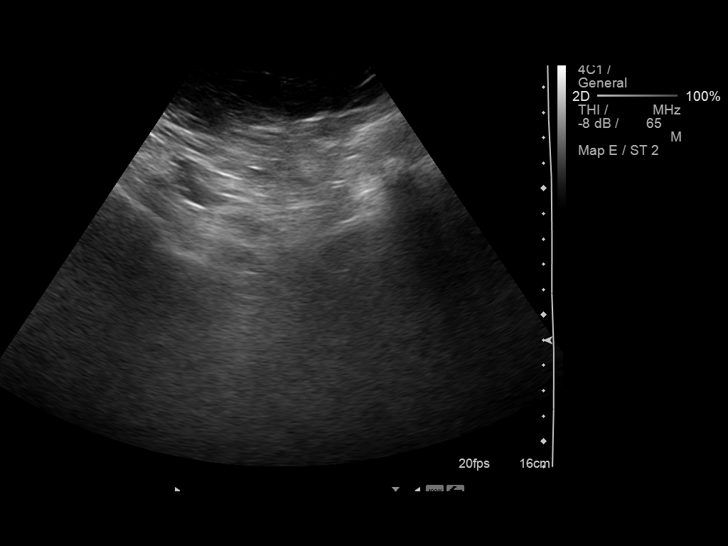
[im 17/32]
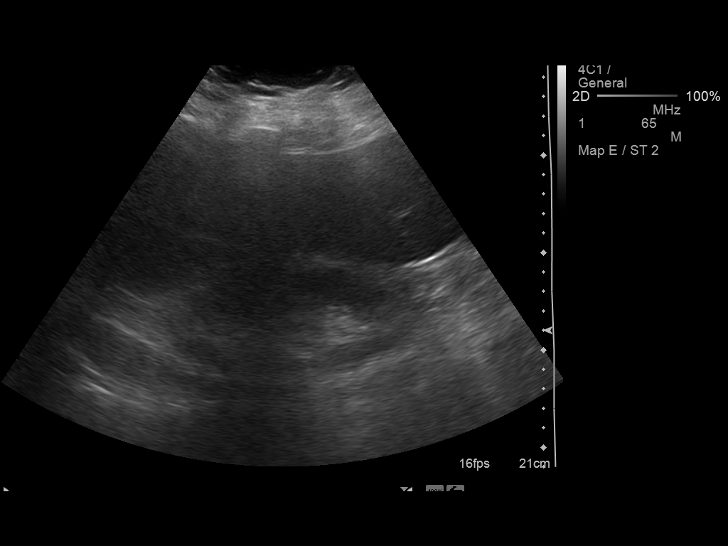
[im 20/32]
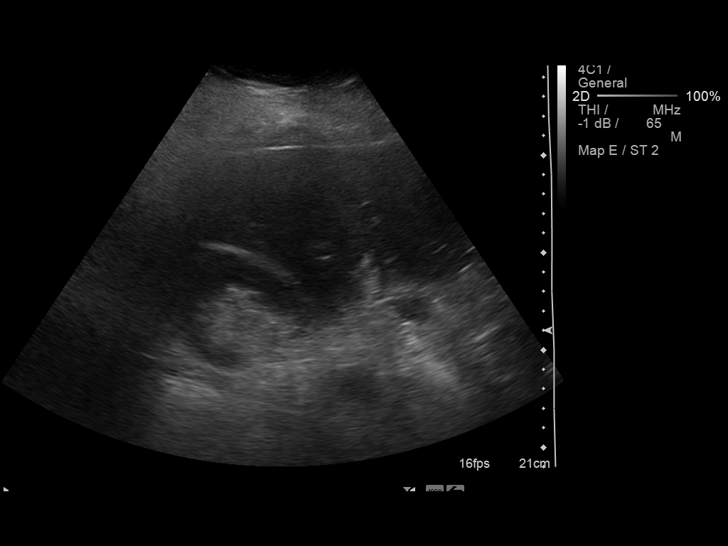
[im 21/32]
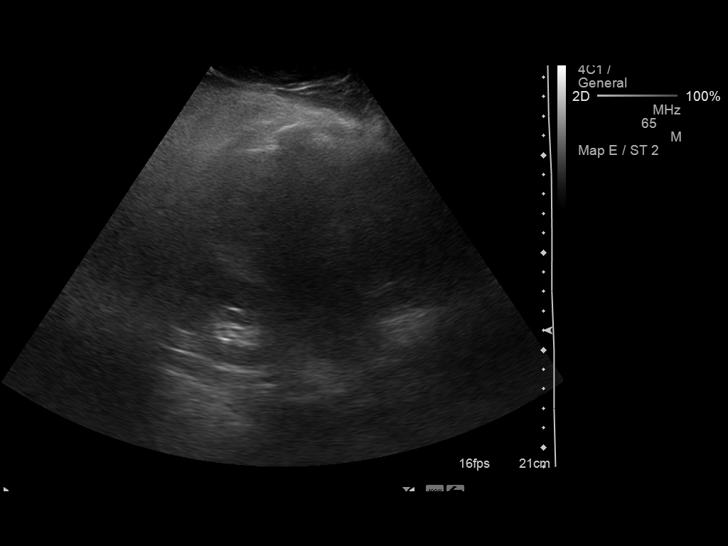
[im 24/32]
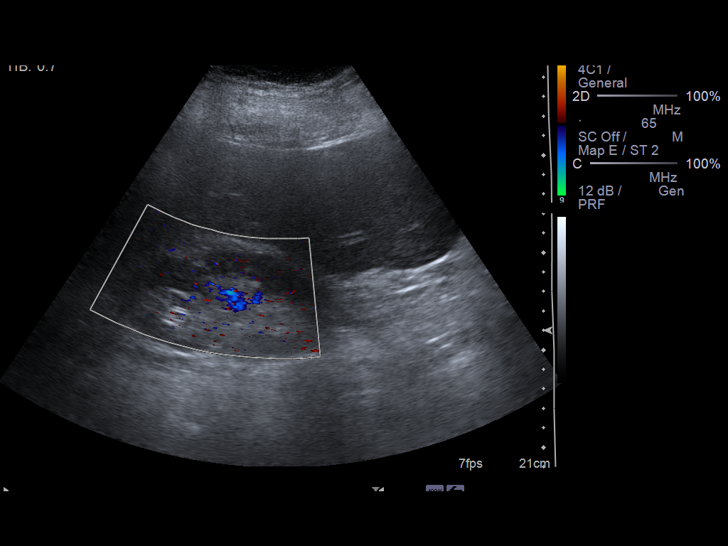
[im 26/32]
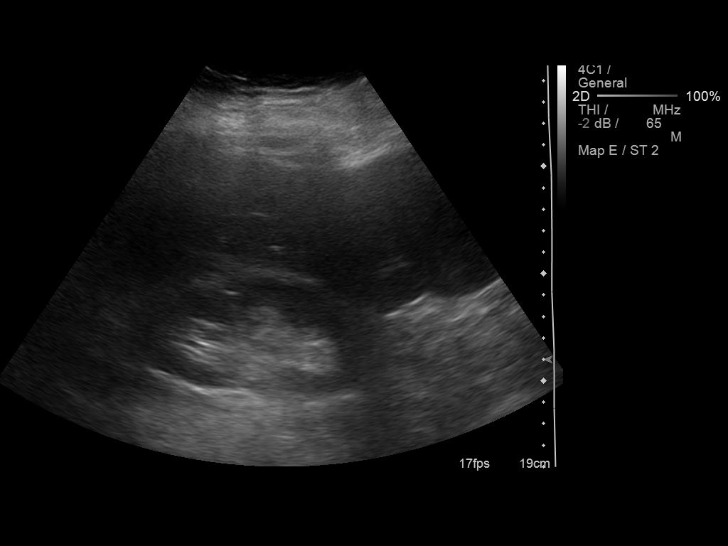
[im 29/32]
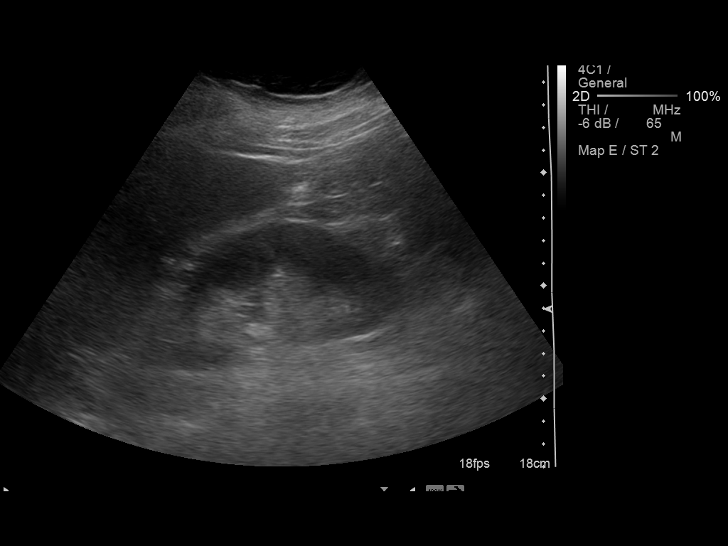
[im 32/32]
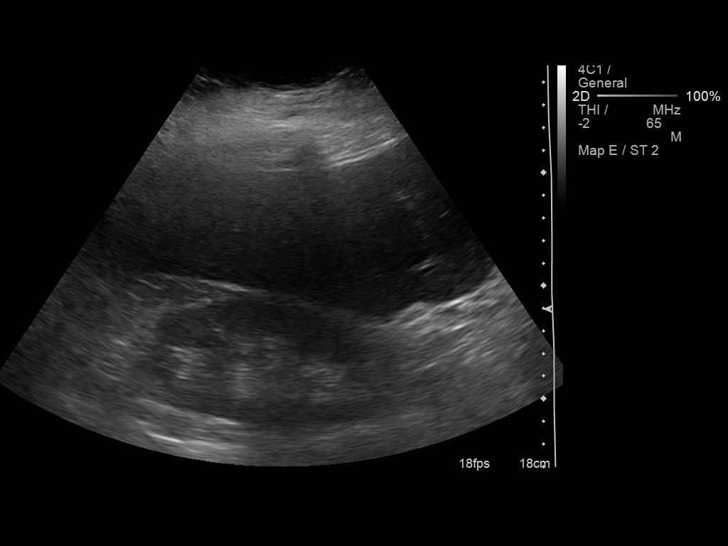

[14 of 25 positions shown; findings below may reference images not displayed]

FINDINGS: Right Kidney:  12.3 cm. No hydronephrosis or renal mass..

Left Kidney:  12.3 cm. No hydronephrosis or renal mass..

Bladder:  Decompressed by Foley catheter.
IMPRESSION: Portable renal sonogram without abnormality noted.

Please note that ultrasound may not detect pyelonephritis.

## 2014-06-07 IMAGING — CR DG CHEST 1V PORT
1 series · 1 of 1 positions shown · non-contrast
Comparison: 05/26/2012

CLINICAL DATA: Shortness of breath.

PORTABLE CHEST - 1 VIEW

[AP]
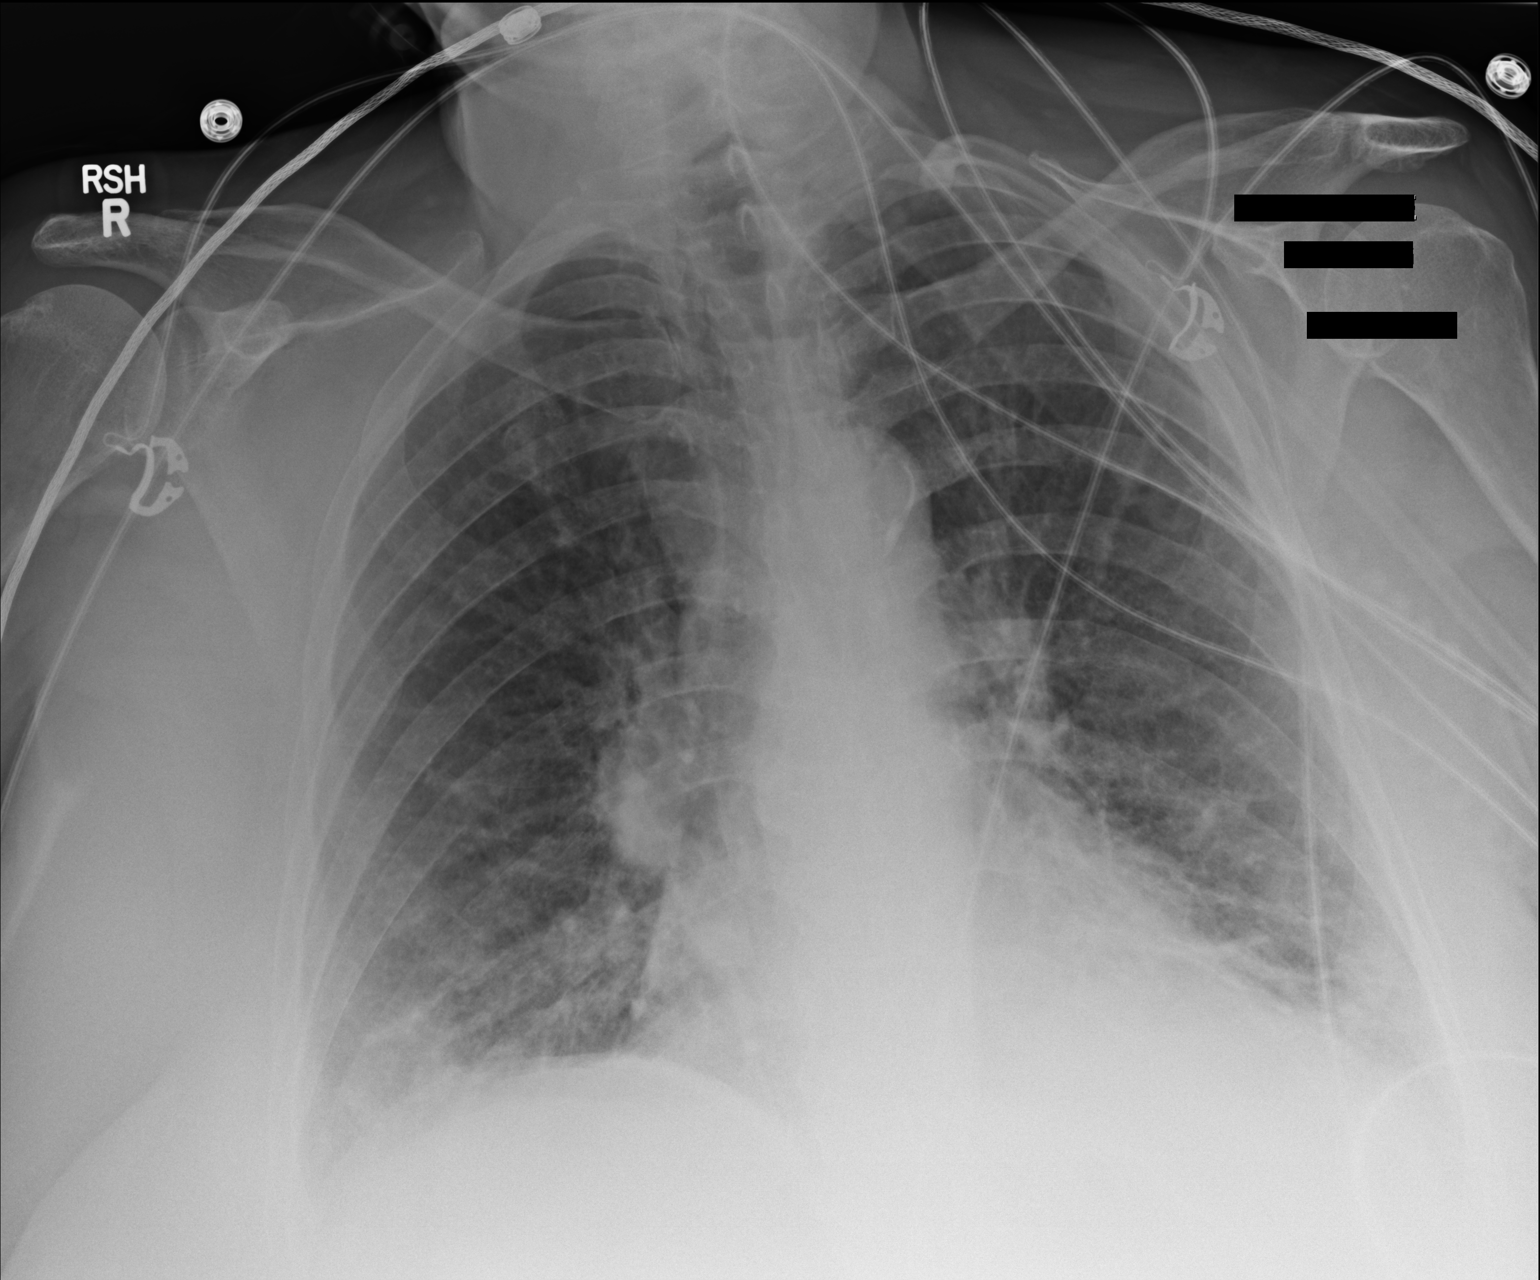

[1 of 1 positions shown; findings below may reference images not displayed]

FINDINGS: Bibasilar airspace opacities increased since prior study.
Heart is upper limits normal in size.  Possible small effusions.
Diffuse interstitial prominence throughout the lungs, stable.
IMPRESSION: Increasing bibasilar atelectasis or infiltrates.

Stable diffuse interstitial prominence.

Question small bilateral effusions.

## 2014-06-08 DIAGNOSIS — Z1231 Encounter for screening mammogram for malignant neoplasm of breast: Secondary | ICD-10-CM | POA: Diagnosis not present

## 2014-06-15 ENCOUNTER — Ambulatory Visit: Payer: Medicare Other | Admitting: Pulmonary Disease

## 2014-06-27 ENCOUNTER — Other Ambulatory Visit: Payer: Self-pay | Admitting: Cardiology

## 2014-06-27 NOTE — Telephone Encounter (Signed)
Rx(s) sent to pharmacy electronically.  

## 2014-06-28 ENCOUNTER — Ambulatory Visit (INDEPENDENT_AMBULATORY_CARE_PROVIDER_SITE_OTHER): Payer: Medicare Other | Admitting: Pulmonary Disease

## 2014-06-28 ENCOUNTER — Encounter: Payer: Self-pay | Admitting: Pulmonary Disease

## 2014-06-28 VITALS — BP 126/74 | HR 100 | Temp 98.0°F | Ht 65.0 in | Wt 227.0 lb

## 2014-06-28 DIAGNOSIS — J438 Other emphysema: Secondary | ICD-10-CM

## 2014-06-28 DIAGNOSIS — J9611 Chronic respiratory failure with hypoxia: Secondary | ICD-10-CM | POA: Diagnosis not present

## 2014-06-28 MED ORDER — ALBUTEROL SULFATE (2.5 MG/3ML) 0.083% IN NEBU
2.5000 mg | INHALATION_SOLUTION | Freq: Four times a day (QID) | RESPIRATORY_TRACT | Status: DC | PRN
Start: 2014-06-28 — End: 2016-06-09

## 2014-06-28 NOTE — Patient Instructions (Signed)
Stay on your current breathing medications Would try taking a breathing treatment first thing in the morning as soon as you get up to see if this helps with your early morning symptoms. Will call in prescription for albuterol solution for your machine. Take mucinex dm as needed. Work on weight reduction, and trying to stay active. followup with me again in 78mos.

## 2014-06-28 NOTE — Progress Notes (Signed)
   Subjective:    Patient ID: Destiny Shelton, female    DOB: 04/17/1946, 69 y.o.   MRN: [id number]  HPI Patient comes in today for follow-up of her known COPD, along with multifactorial chronic respiratory failure. She also has known diastolic dysfunction, and a probable shunt that we have yet to isolate. She has been fairly stable since the last visit, but approximately one week ago began to have increasing cough with chest congestion but no significant purulence. She started on Mucinex, and feels this has helped her significantly. She does not feel that she has a chest cold currently, although she will occasionally have some discolored mucus. Her main issue is first thing in the morning upon arising she will have chest congestion and a cough with mucus. Once this is cleared, she feels better for the rest of the day.   Review of Systems  Constitutional: Negative for fever and unexpected weight change.  HENT: Positive for congestion and postnasal drip. Negative for dental problem, ear pain, nosebleeds, rhinorrhea, sinus pressure, sneezing, sore throat and trouble swallowing.   Eyes: Negative for redness and itching.  Respiratory: Positive for cough, chest tightness, shortness of breath and wheezing.   Cardiovascular: Positive for leg swelling. Negative for palpitations.  Gastrointestinal: Negative for nausea and vomiting.  Genitourinary: Negative for dysuria.  Musculoskeletal: Negative for joint swelling.  Skin: Negative for rash.  Neurological: Negative for headaches.  Hematological: Does not bruise/bleed easily.  Psychiatric/Behavioral: Negative for dysphoric mood. The patient is not nervous/anxious.        Objective:   Physical Exam Obese female in no acute distress Nose without purulence or discharge noted Neck without lymphadenopathy or thyromegaly Chest with adequate airflow, no wheezing or rhonchi noted Cardiac exam with mild tachycardia but regular, 2/6 systolic murmur Lower  extremities with mild edema, no cyanosis Alert and oriented, moves all 4 extremities.       Assessment & Plan:

## 2014-06-28 NOTE — Assessment & Plan Note (Signed)
The patient has chronic respiratory failure that is multifactorial, and secondary to her known COPD, diastolic dysfunction, obesity, and a probable shunt that we have yet to recognize. I have asked her to continue on her oxygen therapy, and her various other medical issues are being addressed.

## 2014-06-28 NOTE — Assessment & Plan Note (Signed)
The patient appears to be stable from a COPD standpoint. Moving adequate air and has no bronchospasm on exam. She does have a cough with mucus production first thing in the morning upon arising, and I've asked her to try to see if taking a nebulizer treatment first thing will help with the symptoms. She has also been taking Mucinex, and feels that it is helped with mucociliary clearance.

## 2014-07-04 ENCOUNTER — Telehealth: Payer: Self-pay | Admitting: Pulmonary Disease

## 2014-07-04 DIAGNOSIS — J438 Other emphysema: Secondary | ICD-10-CM

## 2014-07-04 NOTE — Telephone Encounter (Signed)
ATC fast busy signal WCB 

## 2014-07-05 ENCOUNTER — Telehealth: Payer: Self-pay | Admitting: Pulmonary Disease

## 2014-07-05 DIAGNOSIS — J438 Other emphysema: Secondary | ICD-10-CM

## 2014-07-05 NOTE — Telephone Encounter (Signed)
Called and spoke to pt. Pt is requesting a portable neb machine. Pt stated she has a current neb machine but it does not aerosol the medication as well as it use to. Pt is requesting a portable machine because she wants something more compact.   Dr. Gwenette Greet please advise.

## 2014-07-05 NOTE — Telephone Encounter (Signed)
Ok with me 

## 2014-07-05 NOTE — Telephone Encounter (Signed)
Called and spoke with pt and she is aware that order has been placed.  Message has been sent to [facility] and nothing further is needed.

## 2014-07-05 NOTE — Telephone Encounter (Signed)
OK Order sent to [facility]

## 2014-07-25 ENCOUNTER — Other Ambulatory Visit: Payer: Self-pay | Admitting: Cardiology

## 2014-07-25 ENCOUNTER — Other Ambulatory Visit: Payer: Self-pay | Admitting: Pulmonary Disease

## 2014-07-25 NOTE — Telephone Encounter (Signed)
Rx refill sent to patient pharmacy   

## 2014-07-31 ENCOUNTER — Ambulatory Visit (INDEPENDENT_AMBULATORY_CARE_PROVIDER_SITE_OTHER): Payer: Medicare Other | Admitting: Cardiology

## 2014-07-31 VITALS — BP 114/50 | HR 112 | Ht 65.0 in | Wt 229.6 lb

## 2014-07-31 DIAGNOSIS — R000 Tachycardia, unspecified: Secondary | ICD-10-CM

## 2014-07-31 DIAGNOSIS — I480 Paroxysmal atrial fibrillation: Secondary | ICD-10-CM | POA: Diagnosis not present

## 2014-07-31 DIAGNOSIS — I270 Primary pulmonary hypertension: Secondary | ICD-10-CM | POA: Diagnosis not present

## 2014-07-31 DIAGNOSIS — I5032 Chronic diastolic (congestive) heart failure: Secondary | ICD-10-CM

## 2014-07-31 DIAGNOSIS — IMO0001 Reserved for inherently not codable concepts without codable children: Secondary | ICD-10-CM

## 2014-07-31 DIAGNOSIS — I10 Essential (primary) hypertension: Secondary | ICD-10-CM

## 2014-07-31 DIAGNOSIS — I471 Supraventricular tachycardia: Secondary | ICD-10-CM | POA: Diagnosis not present

## 2014-07-31 DIAGNOSIS — I272 Pulmonary hypertension, unspecified: Secondary | ICD-10-CM

## 2014-07-31 NOTE — Patient Instructions (Addendum)
It is okay to take an extra 1/2 dose lasix ( furosemide) for swelling.   Your physician wants you to follow-up in   6 month  Dr Ellyn Hack--- 30 min appointment.  You will receive a reminder letter in the mail two months in advance. If you don't receive a letter, please call our office to schedule the follow-up appointment.

## 2014-08-02 ENCOUNTER — Encounter: Payer: Self-pay | Admitting: Cardiology

## 2014-08-02 NOTE — Assessment & Plan Note (Signed)
Stable heart rates. I think when she is able to sit down and relax her original given to the 80s and 90s. Continue high-dose calcium channel blocker

## 2014-08-02 NOTE — Assessment & Plan Note (Signed)
Unfortunately, she is unable to exercise very much. Hopefully by increasing her oxygen flow she'll go to tolerate longer bicycle rides and walking. Dietary modification discussed.

## 2014-08-02 NOTE — Assessment & Plan Note (Signed)
Relatively well-controlled with no recurrence. On high-dose calcium channel blocker for rate control. Anticoagulation with Xarelto & no bleeding issues.

## 2014-08-02 NOTE — Assessment & Plan Note (Signed)
On ARB for ocular reduction. Adequate blood pressure control. On stable dose of diuretic.

## 2014-08-02 NOTE — Assessment & Plan Note (Signed)
Excellent control. Continue current regimen

## 2014-08-02 NOTE — Assessment & Plan Note (Addendum)
Combined Primary and secondary1 hypertension with diastolic dysfunction and primary pulmonary hypertension from COPD. Continue high-dose Lasix with when necessary additional doses for worsening edema. Would increase her oxygen to 6 L per minute when necessary exertional dyspnea to allow for more prolonged walking time. She is on calcium channel blocker for rate control which also helps pulmonary hypertension - if responsive. This was not tested. Her pressures were not overly elevated, therefore I would not consider more designer medications for pulmonary hypertension.

## 2014-08-02 NOTE — Progress Notes (Signed)
PCP: Marton Redwood, MD  Clinic Note: Chief Complaint  Patient presents with  . 6 mo rov    patient reports ankle edema and chest tightness once.    HPI: Destiny Shelton is a 69 y.o. female with a Cardiovascular Problem List below who presents today for a six-month followup for paroxysmal A. fib, presents tachycardia, atrial tachycardia, chronic diastolic heart failure and hypertension along with oxygen requiring COPD.  She is limited to how much exertion she can do and has chronic exertional dyspnea. She has a rapid resting heart rate but it does get them at home into the 80s.  She was very symptomatic when she had atrial fibrillation in the past.  Interval History: She presents today he for routine followup really no major cardiac complaints. She actually indicates that she may actually be a little bit better than she has been in the past. She is now able to ride her stationary bicycle for 15 minutes at a time. She does get short of breath especially when her heart rate increases with exertion. Basically she will stop flutter her rate on down and then she feels better. She is not increased her oxygen with exertion to see if that helps. She does not really notice her heart rate at the current rate. She also has not had any recurrent symptoms of rapid/irregular heartbeats to suggest recurrence of A. Fib. It's been some time since her last COPD exacerbation  As opposed to her last visit when she was recovering from a COPD flare.  As usual,she denies any chest tightness or pressure with rest or exertion - with resection of one episode of a short-lived pinching sensation of tightness.  Relatively well-controlled swelling, chronic orthopnea sleeping in a chair. No notable PND as well as a stable ankle edema on her current dose Lasix. She is currently 80 mg twice a day on a sliding scale when necessary regimen. She has not had any syncope or near syncope, TIA/amaurosis fugax. No claudication. She  denies any complaints of melena, hematochezia, hematuria, epistaxis.  Past Medical History  Diagnosis Date  . Hypertension   . Diabetes mellitus type 2 with neurological manifestations     On Insulin  . Morbidly obese   . Hyperlipemia   . COPD (chronic obstructive pulmonary disease)     Requiring Home O2 at 4L  . Atrial tachycardia     Mostly Sinus Tachycardia  . PAF (paroxysmal atrial fibrillation)   . Inappropriate sinus tachycardia   . Community acquired pneumonia - Recent Admission (07/2013) 07/08/2013  . Sleep apnea     no cpap machine  02 at 4l/min all the time    Prior Cardiac Evaluation and Past Surgical History: Procedure Laterality Date  . Tee without cardioversion  03/24/2012    Procedure: TRANSESOPHAGEAL ECHOCARDIOGRAM (TEE);  Surgeon: Pixie Casino, MD;  Location: [facility] OR;  Service: Cardiovascular;  Laterality: N/A;  TEE with Bubble  . Right and Left Cardiac Catheterization  July 2013    RAP: 22 mmHg, and RVP: 46/16 mmHg, RV EDP 18; L. BP 110/11 mmHg, LVEDP 20 mmHg;  PAP 45/29 mmHg (mean 37 mmHg); PCWP 29/22 mmHg - 26 mmHg; no significant coronary disease   Current Outpatient Prescriptions on File Prior to Visit  Medication Sig Dispense Refill  . acetaminophen (TYLENOL) 500 MG tablet Take 1,000 mg by mouth every 6 (six) hours as needed for mild pain.    Marland Kitchen albuterol (PROVENTIL) (2.5 MG/3ML) 0.083% nebulizer solution Take 3 mLs (2.5  mg total) by nebulization every 6 (six) hours as needed for wheezing or shortness of breath. 75 mL 3  . ALPRAZolam (XANAX) 0.5 MG tablet Take 0.5-1 mg by mouth at bedtime as needed. For anxiety    . AMBULATORY NON FORMULARY MEDICATION Continuous O2 @@ 4LMP    . atorvastatin (LIPITOR) 40 MG tablet Take 1 tablet by mouth daily.    . benzonatate (TESSALON) 200 MG capsule Take 400 mg by mouth 3 (three) times daily as needed. For cough    . buPROPion (WELLBUTRIN XL) 150 MG 24 hr tablet Take 150 mg by mouth daily.    Marland Kitchen CALCIUM-VITAMIN D PO Take 1  tablet by mouth 2 (two) times daily.     . cyclobenzaprine (FLEXERIL) 10 MG tablet Take 10 mg by mouth at bedtime.     . docusate sodium (COLACE) 100 MG capsule Take 100 mg by mouth 2 (two) times daily.     . DULoxetine (CYMBALTA) 60 MG capsule Take 60 mg by mouth every morning.     . furosemide (LASIX) 40 MG tablet Take 80 mg by mouth 2 (two) times daily.     Marland Kitchen gabapentin (NEURONTIN) 600 MG tablet Take 600 mg by mouth 3 (three) times daily.     Marland Kitchen HYDROcodone-acetaminophen (NORCO/VICODIN) 5-325 MG per tablet Take 1 tablet by mouth every 4 (four) hours as needed.     . insulin aspart (NOVOLOG) 100 UNIT/ML injection Inject 10 Units into the skin 2 (two) times daily.     . insulin glargine (LANTUS) 100 UNIT/ML injection Inject 70 Units into the skin daily before breakfast. 10 mL   . irbesartan (AVAPRO) 150 MG tablet TAKE 1 TABLET BY MOUTH DAILY 30 tablet 7  . iron polysaccharides (NIFEREX) 150 MG capsule Take 1 capsule (150 mg total) by mouth daily. 30 capsule 6  . levalbuterol (XOPENEX HFA) 45 MCG/ACT inhaler Inhale 1-2 puffs into the lungs every 4 (four) hours as needed for wheezing. 1 Inhaler 12  . Multiple Vitamin (MULTIVITAMIN) tablet Take 1 tablet by mouth daily.      . Omega-3 Fatty Acids (FISH OIL) 1000 MG CAPS Take 1 capsule by mouth 3 (three) times daily.     Marland Kitchen omeprazole (PRILOSEC) 20 MG capsule Take 20 mg by mouth at bedtime.     . polyethylene glycol (MIRALAX / GLYCOLAX) packet Take 17 g by mouth every morning.     . polyvinyl alcohol (LIQUIFILM TEARS) 1.4 % ophthalmic solution Place 1 drop into both eyes as needed for dry eyes.    . potassium chloride (KLOR-CON) 10 MEQ CR tablet Take 10 mEq by mouth 3 (three) times daily.     . rivaroxaban (XARELTO) 20 MG TABS tablet Take 1 tablet (20 mg total) by mouth daily with supper. Restart on Saturday June 20. 30 tablet   . roflumilast (DALIRESP) 500 MCG TABS tablet Take 500 mcg by mouth daily.     Marland Kitchen SPIRIVA HANDIHALER 18 MCG inhalation capsule  INHALE THE CONTENTS OF 1 CAPSULE BY MOUTH VIA HANDIHALER DAILY 30 capsule 6  . SYMBICORT 160-4.5 MCG/ACT inhaler INHALE 2 PUFFS BY MOUTH INTO THE LUNGS TWICE DAILY 10.2 g 5  . verapamil (VERELAN PM) 360 MG 24 hr capsule TAKE ONE CAPSULE BY MOUTH DAILY 30 capsule 7   No current facility-administered medications on file prior to visit.   Allergies  Allergen Reactions  . Aspirin Shortness Of Breath  . Nsaids Shortness Of Breath   No Change in Social and Family History -  reviewed in Epic  ROS: A comprehensive Review of Systems - was performed Review of Systems  Constitutional: Negative for malaise/fatigue.  HENT: Negative for nosebleeds.   Respiratory: Positive for cough (morning cough), shortness of breath (Stable, if not improved from baseline. Able to ride a stationary bike for 15 minutes.) and wheezing.        On home O2. 4 L  Cardiovascular: Positive for leg swelling (Mild). Negative for chest pain, palpitations, orthopnea and PND.       Per history of present illness  Gastrointestinal: Negative for blood in stool and melena.  Genitourinary: Negative for hematuria.  Musculoskeletal: Positive for joint pain.  Neurological: Negative for dizziness and headaches.  Endo/Heme/Allergies: Bruises/bleeds easily.  Psychiatric/Behavioral: Negative.   All other systems reviewed and are negative.  Wt Readings from Last 3 Encounters:  07/31/14 229 lb 9.6 oz (104.146 kg)  06/28/14 227 lb (102.967 kg)  05/02/14 228 lb 4 oz (103.534 kg)   PHYSICAL EXAM BP 114/50 mmHg  Pulse 112  Ht 5\' 5"  (1.651 m)  Wt 229 lb 9.6 oz (104.146 kg)  BMI 38.21 kg/m2 General appearance: A&O x3; chronically ill-appearing obese woman who is in no acute distress. Wearing chronic home O2 from her concentrator @ 4 Lpm. No increased work of breathing.  Neck: no carotid bruit, no JVD and supple, symmetrical, trachea midline  Lungs: Nonlabored,  But with accessory muscle use.  No true wheezes but there is scattered  rhonchorous expirations bilaterally. Occasional coughing  Heart: Mildly tachycardic; Regular rhythm. Normal S1-S2, no M./R./G. Unable to palpate PMI due to body habitus.  Abdomen: soft, NT/ND/NABS, Unable to palpate HSM due to body habitus. No HJR  Extremities: edema Trace, no ulcers, gangrene or trophic changes and Mild spider veins, no significant varicosities  Pulses: 2+ and symmetric  Skin: Skin color, texture, turgor normal. No rashes or lesions  Neurologic: Grossly normal   Adult ECG Report  Rate: 112 ;  Rhythm: sinus tachycardia otherwise essentially normal EKG normal axis, intervals, duration and voltage.   Narrative Interpretation:  stable EKG   Recent Labs  none checked since May.  ASSESSMENT / PLAN: PAF (paroxysmal atrial fibrillation) Relatively well-controlled with no recurrence. On high-dose calcium channel blocker for rate control. Anticoagulation with Xarelto & no bleeding issues.   Pulmonary hypertension Combined Primary and secondary1 hypertension with diastolic dysfunction and primary pulmonary hypertension from COPD. Continue high-dose Lasix with when necessary additional doses for worsening edema. Would increase her oxygen to 6 L per minute when necessary exertional dyspnea to allow for more prolonged walking time. She is on calcium channel blocker for rate control which also helps pulmonary hypertension - if responsive. This was not tested. Her pressures were not overly elevated, therefore I would not consider more designer medications for pulmonary hypertension.   Chronic diastolic heart failure of unknown etiology On ARB for ocular reduction. Adequate blood pressure control. On stable dose of diuretic.   Inappropriate sinus tachycardia Stable heart rates. I think when she is able to sit down and relax her original given to the 80s and 90s. Continue high-dose calcium channel blocker   Essential hypertension Excellent control. Continue current  regimen   Obesity, Class II, BMI 35-39.9, with comorbidity Unfortunately, she is unable to exercise very much. Hopefully by increasing her oxygen flow she'll go to tolerate longer bicycle rides and walking. Dietary modification discussed.     No orders of the defined types were placed in this encounter.   No orders of the  defined types were placed in this encounter.    Followup:  6  months  DAVID W. Ellyn Hack, M.D., M.S. Interventional Cardiologist [facility]

## 2014-08-04 DIAGNOSIS — E1149 Type 2 diabetes mellitus with other diabetic neurological complication: Secondary | ICD-10-CM | POA: Diagnosis not present

## 2014-08-04 DIAGNOSIS — N390 Urinary tract infection, site not specified: Secondary | ICD-10-CM | POA: Diagnosis not present

## 2014-08-04 DIAGNOSIS — E785 Hyperlipidemia, unspecified: Secondary | ICD-10-CM | POA: Diagnosis not present

## 2014-08-04 DIAGNOSIS — Z0000 Encounter for general adult medical examination without abnormal findings: Secondary | ICD-10-CM | POA: Diagnosis not present

## 2014-08-04 DIAGNOSIS — I10 Essential (primary) hypertension: Secondary | ICD-10-CM | POA: Diagnosis not present

## 2014-08-04 DIAGNOSIS — M810 Age-related osteoporosis without current pathological fracture: Secondary | ICD-10-CM | POA: Diagnosis not present

## 2014-08-11 DIAGNOSIS — I272 Other secondary pulmonary hypertension: Secondary | ICD-10-CM | POA: Diagnosis not present

## 2014-08-11 DIAGNOSIS — Z0000 Encounter for general adult medical examination without abnormal findings: Secondary | ICD-10-CM | POA: Diagnosis not present

## 2014-08-11 DIAGNOSIS — R0600 Dyspnea, unspecified: Secondary | ICD-10-CM | POA: Diagnosis not present

## 2014-08-11 DIAGNOSIS — E1149 Type 2 diabetes mellitus with other diabetic neurological complication: Secondary | ICD-10-CM | POA: Diagnosis not present

## 2014-08-11 DIAGNOSIS — I509 Heart failure, unspecified: Secondary | ICD-10-CM | POA: Diagnosis not present

## 2014-08-11 DIAGNOSIS — Z6836 Body mass index (BMI) 36.0-36.9, adult: Secondary | ICD-10-CM | POA: Diagnosis not present

## 2014-08-11 DIAGNOSIS — D649 Anemia, unspecified: Secondary | ICD-10-CM | POA: Diagnosis not present

## 2014-08-11 DIAGNOSIS — E785 Hyperlipidemia, unspecified: Secondary | ICD-10-CM | POA: Diagnosis not present

## 2014-08-11 DIAGNOSIS — F329 Major depressive disorder, single episode, unspecified: Secondary | ICD-10-CM | POA: Diagnosis not present

## 2014-08-11 DIAGNOSIS — I10 Essential (primary) hypertension: Secondary | ICD-10-CM | POA: Diagnosis not present

## 2014-08-11 DIAGNOSIS — J449 Chronic obstructive pulmonary disease, unspecified: Secondary | ICD-10-CM | POA: Diagnosis not present

## 2014-08-11 DIAGNOSIS — I4891 Unspecified atrial fibrillation: Secondary | ICD-10-CM | POA: Diagnosis not present

## 2014-08-14 ENCOUNTER — Encounter: Payer: Self-pay | Admitting: Cardiology

## 2014-08-14 ENCOUNTER — Telehealth: Payer: Self-pay | Admitting: *Deleted

## 2014-08-14 NOTE — Telephone Encounter (Signed)
Spoke to patient. Result given . Verbalized understanding  

## 2014-08-14 NOTE — Telephone Encounter (Signed)
-----   Message from Leonie Man, MD sent at 08/14/2014  2:13 PM EDT ----- Recent Labs: August 11, 2014 Na+ 138, K+ 4.0, Cl- 99, HCO3- 29 , BUN 17, Cr 0.8, Glu 121, Ca2+ 9.2; AST 19, ALT 12, AlkP 80, Alb 3.6, TP 7.3, T Bili 0.5 CBC: W 10.7, H/H 9.3/29, Plt 458 TC 126, TG 94, HDL 44, LDL 63  Mildly anemic - but otherwise Liver & kidney function look good.  Lipid profile is excellent!Marland Kitchen  [place]

## 2014-08-16 DIAGNOSIS — Z1212 Encounter for screening for malignant neoplasm of rectum: Secondary | ICD-10-CM | POA: Diagnosis not present

## 2014-08-16 DIAGNOSIS — I6529 Occlusion and stenosis of unspecified carotid artery: Secondary | ICD-10-CM | POA: Diagnosis not present

## 2014-08-21 ENCOUNTER — Ambulatory Visit (INDEPENDENT_AMBULATORY_CARE_PROVIDER_SITE_OTHER): Payer: Medicare Other | Admitting: Internal Medicine

## 2014-08-21 ENCOUNTER — Encounter: Payer: Self-pay | Admitting: Internal Medicine

## 2014-08-21 ENCOUNTER — Telehealth: Payer: Self-pay

## 2014-08-21 VITALS — BP 124/50 | HR 92 | Ht 65.0 in | Wt 225.5 lb

## 2014-08-21 DIAGNOSIS — D509 Iron deficiency anemia, unspecified: Secondary | ICD-10-CM

## 2014-08-21 DIAGNOSIS — I482 Chronic atrial fibrillation, unspecified: Secondary | ICD-10-CM

## 2014-08-21 DIAGNOSIS — Z7901 Long term (current) use of anticoagulants: Secondary | ICD-10-CM

## 2014-08-21 DIAGNOSIS — D126 Benign neoplasm of colon, unspecified: Secondary | ICD-10-CM | POA: Diagnosis not present

## 2014-08-21 DIAGNOSIS — R195 Other fecal abnormalities: Secondary | ICD-10-CM | POA: Diagnosis not present

## 2014-08-21 MED ORDER — NA SULFATE-K SULFATE-MG SULF 17.5-3.13-1.6 GM/177ML PO SOLN: ORAL | Status: DC

## 2014-08-21 NOTE — Patient Instructions (Signed)
You have been scheduled for a colonoscopy. Please follow written instructions given to you at your visit today.  Please use the prep kit you have been given today. If you use inhalers (even only as needed), please bring them with you on the day of your procedure.   You will be contaced by our office prior to your procedure for directions on holding your xarelto.  If you do not hear from our office 1 week prior to your scheduled procedure, please call [phone] to discuss.   I appreciate the opportunity to care for you. Silvano Rusk, M.D., [facility]

## 2014-08-21 NOTE — Assessment & Plan Note (Signed)
Colonoscopy

## 2014-08-21 NOTE — Progress Notes (Signed)
Subjective:    Patient ID: Destiny Shelton, female    DOB: 11-13-1945, 69 y.o.   MRN: [id number] Cc: heme +, anemia, colon polyps HPI The patient is here with her husband for follow-up. She has a history of attenuated colonic polyposis and a sigmoid polyp that I have been unable to completely removed. It has had high-grade dysplasia on one occasion. She had Hemoccult testing that showed that she was heme oh sure positive within the past few months. Testing on 08/16/2014. Her hemoglobin is about 10.7 she has a chronic anemia. Respiratory status is relatively stable though she's continues on home oxygen. Allergies  Allergen Reactions  . Aspirin Shortness Of Breath  . Nsaids Shortness Of Breath   Outpatient Prescriptions Prior to Visit  Medication Sig Dispense Refill  . acetaminophen (TYLENOL) 500 MG tablet Take 1,000 mg by mouth every 6 (six) hours as needed for mild pain.    Marland Kitchen albuterol (PROVENTIL) (2.5 MG/3ML) 0.083% nebulizer solution Take 3 mLs (2.5 mg total) by nebulization every 6 (six) hours as needed for wheezing or shortness of breath. 75 mL 3  . ALPRAZolam (XANAX) 0.5 MG tablet Take 0.5-1 mg by mouth at bedtime as needed. For anxiety    . AMBULATORY NON FORMULARY MEDICATION Continuous O2 @@ 4LMP    . atorvastatin (LIPITOR) 40 MG tablet Take 1 tablet by mouth daily.    . benzonatate (TESSALON) 200 MG capsule Take 400 mg by mouth 3 (three) times daily as needed. For cough    . buPROPion (WELLBUTRIN XL) 150 MG 24 hr tablet Take 150 mg by mouth daily.    Marland Kitchen CALCIUM-VITAMIN D PO Take 1 tablet by mouth 2 (two) times daily.     . cyclobenzaprine (FLEXERIL) 10 MG tablet Take 10 mg by mouth at bedtime.     . docusate sodium (COLACE) 100 MG capsule Take 100 mg by mouth 2 (two) times daily.     . DULoxetine (CYMBALTA) 60 MG capsule Take 60 mg by mouth every morning.     . furosemide (LASIX) 40 MG tablet Take 80 mg by mouth 2 (two) times daily.     Marland Kitchen gabapentin (NEURONTIN) 600 MG tablet Take  600 mg by mouth 3 (three) times daily.     Marland Kitchen HYDROcodone-acetaminophen (NORCO/VICODIN) 5-325 MG per tablet Take 1 tablet by mouth every 4 (four) hours as needed.     . insulin aspart (NOVOLOG) 100 UNIT/ML injection Inject 10 Units into the skin 2 (two) times daily.     . insulin glargine (LANTUS) 100 UNIT/ML injection Inject 70 Units into the skin daily before breakfast. 10 mL   . irbesartan (AVAPRO) 150 MG tablet TAKE 1 TABLET BY MOUTH DAILY 30 tablet 7  . iron polysaccharides (NIFEREX) 150 MG capsule Take 1 capsule (150 mg total) by mouth daily. 30 capsule 6  . levalbuterol (XOPENEX HFA) 45 MCG/ACT inhaler Inhale 1-2 puffs into the lungs every 4 (four) hours as needed for wheezing. 1 Inhaler 12  . Multiple Vitamin (MULTIVITAMIN) tablet Take 1 tablet by mouth daily.      . Omega-3 Fatty Acids (FISH OIL) 1000 MG CAPS Take 1 capsule by mouth 3 (three) times daily.     Marland Kitchen omeprazole (PRILOSEC) 20 MG capsule Take 20 mg by mouth at bedtime.     . polyethylene glycol (MIRALAX / GLYCOLAX) packet Take 17 g by mouth every morning.     . polyvinyl alcohol (LIQUIFILM TEARS) 1.4 % ophthalmic solution Place 1 drop into both  eyes as needed for dry eyes.    . potassium chloride (KLOR-CON) 10 MEQ CR tablet Take 10 mEq by mouth 3 (three) times daily.     . rivaroxaban (XARELTO) 20 MG TABS tablet Take 1 tablet (20 mg total) by mouth daily with supper. Restart on Saturday June 20. 30 tablet   . roflumilast (DALIRESP) 500 MCG TABS tablet Take 500 mcg by mouth daily.     Marland Kitchen SPIRIVA HANDIHALER 18 MCG inhalation capsule INHALE THE CONTENTS OF 1 CAPSULE BY MOUTH VIA HANDIHALER DAILY 30 capsule 6  . SYMBICORT 160-4.5 MCG/ACT inhaler INHALE 2 PUFFS BY MOUTH INTO THE LUNGS TWICE DAILY 10.2 g 5  . verapamil (VERELAN PM) 360 MG 24 hr capsule TAKE ONE CAPSULE BY MOUTH DAILY 30 capsule 7   No facility-administered medications prior to visit.   Past Medical History  Diagnosis Date  . ALLERGIC RHINITIS   . Hypertension   .  Adenomatous colon polyp   . Gastritis   . Diverticulosis of colon   . Diabetes mellitus type 2 with neurological manifestations     On Insulin  . Depression   . Osteoporosis   . Morbidly obese   . Back pain, chronic   . Anxiety   . Hyperlipemia   . COPD (chronic obstructive pulmonary disease)     Requiring Home O2 at 4L  . GERD (gastroesophageal reflux disease)   . Urosepsis 05/26/2012  . Atrial tachycardia     Mostly Sinus Tachycardia  . PAF (paroxysmal atrial fibrillation)   . Osteoarthritis   . External hemorrhoids   . Carotid stenosis   . Nephrolithiasis   . Headache(784.0)     tension  . Neuromuscular disorder     diabetic neuropathy  . Fibromyalgia     "pain in arms and shoulders."  . Anemia     iron deficient  . Inappropriate sinus tachycardia   . 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  . Community acquired pneumonia - Recent Admission (07/2013) 07/08/2013  . Sleep apnea     no cpap machine  02 at 4l/min all the time   Past Surgical History  Procedure Laterality Date  . Appendectomy  1963  . Cholecystectomy  1963  . Abdominal hysterectomy  1978  . Total knee arthroplasty  1997    right  . Upper gastrointestinal endoscopy  04/09/2006    w/Dilation, gastritis  . Colonoscopy w/ biopsies and polypectomy  07/22/2007, 04/28/2007    2009: diverticulosis, 2008: diverticulosis, adenomatous polyps  . Tee without cardioversion  03/24/2012    Procedure: TRANSESOPHAGEAL ECHOCARDIOGRAM (TEE);  Surgeon: Pixie Casino, MD;  Location: [facility] OR;  Service: Cardiovascular;  Laterality: N/A;  TEE with Bubble  . Esophagogastroduodenoscopy (egd) with propofol N/A 10/01/2012    Procedure: ESOPHAGOGASTRODUODENOSCOPY (EGD) WITH PROPOFOL;  Surgeon: Gatha Mayer, MD;  Location: [facility] ENDOSCOPY;  Service: Endoscopy;  Laterality: N/A;  . Colonoscopy with propofol N/A 10/01/2012    Procedure: COLONOSCOPY WITH PROPOFOL;  Surgeon: Gatha Mayer, MD;  Location: [facility] ENDOSCOPY;  Service: Endoscopy;  Laterality: N/A;    . Colonoscopy N/A 12/21/2012    Procedure: COLONOSCOPY;  Surgeon: Gatha Mayer, MD;  Location: [facility] ENDOSCOPY;  Service: Endoscopy;  Laterality: N/A;  . Hot hemostasis N/A 12/21/2012    Procedure: HOT HEMOSTASIS (ARGON PLASMA COAGULATION/BICAP);  Surgeon: Gatha Mayer, MD;  Location: [facility];  Service: Endoscopy;  Laterality: N/A;  . Right and left cardiac catheterization  July 2013    RAP: 22 mmHg, and RVP: 46/16 mmHg,  RV EDP 18; L. BP 110/11 mmHg, LVEDP 20 mmHg;  PAP 45/29 mmHg (mean 37 mmHg); PCWP 29/22 mmHg - 26 mmHg; no significant coronary disease  . Colonoscopy N/A 11/17/2013    Procedure: COLONOSCOPY;  Surgeon: Gatha Mayer, MD;  Location: [facility] ENDOSCOPY;  Service: Endoscopy;  Laterality: N/A;  . Left and right heart catheterization with coronary angiogram  12/19/2011    Procedure: LEFT AND RIGHT HEART CATHETERIZATION WITH CORONARY ANGIOGRAM;  Surgeon: Leonie Man, MD;  Location: [facility] CATH LAB;  Service: Cardiovascular;;   History   Social History  . Marital Status: Married    Spouse Name: N/A  . Number of Children: 2  . Years of Education: HS   Occupational History  . Disability    Social History Main Topics  . Smoking status: Former Smoker -- 2.00 packs/day for 30 years    Types: Cigarettes    Quit date: 06/02/1994  . Smokeless tobacco: Never Used  . Alcohol Use: No  . Drug Use: No  . Sexual Activity: No   Other Topics Concern  . None   Social History Narrative   She is a married mother of 2, grandmother of 64, great-grandmother of 2. She is with her husband here today. She does not really exercise as she is on home oxygen and has baseline dyspnea.    Does not smoke. Does not drink. She quit smoking somewhere between 10-15 years ago. She is now trying to work on picking up her activity level. She is trying to I think more than likely get back into pulmonary rehabilitation potentially.       Daily caffeine.    Family History  Problem Relation Age of Onset  .  Heart disease Mother   . Stroke Mother   . Heart disease Father   . Heart disease Brother   . Stroke Brother   . Skin cancer Brother   . Colon cancer Neg Hx   . Colon polyps Sister   . Diabetes Sister   . Diabetes Brother   . Irritable bowel syndrome Daughter     Review of Systems Ambulates using a walker    Objective:   Physical Exam BP 124/50 mmHg  Pulse 92  Ht 5\' 5"  (1.651 m)  Wt 225 lb 8 oz (102.286 kg)  BMI 37.53 kg/m2 Elderly obese woman on nasal cannula oxygen Lungs CTA S1S2 no murmur - distant abd obese Mood appropriate     Assessment & Plan:   1. Polyposis coli-attenuated   2. Sigmoid tubular adenoma with high-grade dysplasia   3. Heme + stool   4. Chronic atrial fibrillation   5. Long term current use of anticoagulant   6. Anemia, iron deficiency    It is an appropriate time her to schedule a colonoscopy though check on this large polyp that we would have little remove as well as look for other polyps. Her comorbidities remain an issue but she is stable and I think it's reasonable to proceed with another colonoscopy. She needs to hold her Xarelto 2 days prior to the procedure and we will double check through EMR communication with her cardiologist Dr. Ellyn Hack to confirm that that is okay as it has been in the past. I anticipate resuming the Xarelto soon after her colonoscopy depending upon what is done. She understands the usual risks and benefits and indications of colonoscopy as well as the potential risk of cardiovascular complications including stroke off of anticoagulant. I've explained that today and in the  past.   I appreciate the opportunity to care for this patient. CC: Marton Redwood, MD OrasonD.

## 2014-08-21 NOTE — Telephone Encounter (Signed)
[facility] [address]  [place] [address]  08/21/2014   RE: Destiny Shelton DOB: 1945-12-15 MRN: [id number]   Dear Glenetta Hew M.D.,    We have scheduled the above patient for an endoscopic procedure. Our records show that she is on anticoagulation therapy.   Please advise as to how long the patient may come off her therapy of xarelto prior to the colonoscopy procedure, which is scheduled for 10/10/14.  Please fax back/ or route the completed form to Mckenna Boruff [place], [place] at [phone].   Sincerely,    Silvano Rusk, M.D., [facility]

## 2014-08-21 NOTE — Telephone Encounter (Signed)
She should hold Xarelto x 48 hrs - i.e. 2 doses.  Restart post-procedure day ~2.  Leonie Man, MD

## 2014-08-22 NOTE — Telephone Encounter (Signed)
Patient informed to hold xarelto for 48 hours (2 doses) per Dr. Glenetta Hew. She verbalized understanding.

## 2014-10-03 ENCOUNTER — Encounter (HOSPITAL_COMMUNITY): Payer: Self-pay | Admitting: *Deleted

## 2014-10-10 ENCOUNTER — Ambulatory Visit (HOSPITAL_COMMUNITY): Payer: Medicare Other | Admitting: Anesthesiology

## 2014-10-10 ENCOUNTER — Encounter (HOSPITAL_COMMUNITY)
Admission: RE | Disposition: A | Discharge status: Home / Self Care | Payer: Self-pay | Source: Ambulatory Visit | Attending: Internal Medicine

## 2014-10-10 ENCOUNTER — Ambulatory Visit (HOSPITAL_COMMUNITY)
Admission: RE | Admit: 2014-10-10 | Discharge: 2014-10-10 | Disposition: A | Discharge status: Home / Self Care | Payer: Medicare Other | Source: Ambulatory Visit | Attending: Internal Medicine | Admitting: Internal Medicine

## 2014-10-10 ENCOUNTER — Encounter (HOSPITAL_COMMUNITY): Payer: Self-pay | Admitting: Anesthesiology

## 2014-10-10 DIAGNOSIS — Z9981 Dependence on supplemental oxygen: Secondary | ICD-10-CM | POA: Insufficient documentation

## 2014-10-10 DIAGNOSIS — Z79891 Long term (current) use of opiate analgesic: Secondary | ICD-10-CM | POA: Diagnosis not present

## 2014-10-10 DIAGNOSIS — I480 Paroxysmal atrial fibrillation: Secondary | ICD-10-CM | POA: Insufficient documentation

## 2014-10-10 DIAGNOSIS — Z6837 Body mass index (BMI) 37.0-37.9, adult: Secondary | ICD-10-CM | POA: Diagnosis not present

## 2014-10-10 DIAGNOSIS — Z79899 Other long term (current) drug therapy: Secondary | ICD-10-CM | POA: Insufficient documentation

## 2014-10-10 DIAGNOSIS — D123 Benign neoplasm of transverse colon: Secondary | ICD-10-CM | POA: Insufficient documentation

## 2014-10-10 DIAGNOSIS — K219 Gastro-esophageal reflux disease without esophagitis: Secondary | ICD-10-CM | POA: Insufficient documentation

## 2014-10-10 DIAGNOSIS — E1140 Type 2 diabetes mellitus with diabetic neuropathy, unspecified: Secondary | ICD-10-CM | POA: Insufficient documentation

## 2014-10-10 DIAGNOSIS — D509 Iron deficiency anemia, unspecified: Secondary | ICD-10-CM | POA: Diagnosis not present

## 2014-10-10 DIAGNOSIS — K5730 Diverticulosis of large intestine without perforation or abscess without bleeding: Secondary | ICD-10-CM | POA: Diagnosis not present

## 2014-10-10 DIAGNOSIS — Z794 Long term (current) use of insulin: Secondary | ICD-10-CM | POA: Insufficient documentation

## 2014-10-10 DIAGNOSIS — J9610 Chronic respiratory failure, unspecified whether with hypoxia or hypercapnia: Secondary | ICD-10-CM | POA: Diagnosis not present

## 2014-10-10 DIAGNOSIS — F419 Anxiety disorder, unspecified: Secondary | ICD-10-CM | POA: Diagnosis not present

## 2014-10-10 DIAGNOSIS — J449 Chronic obstructive pulmonary disease, unspecified: Secondary | ICD-10-CM | POA: Diagnosis not present

## 2014-10-10 DIAGNOSIS — G4730 Sleep apnea, unspecified: Secondary | ICD-10-CM | POA: Insufficient documentation

## 2014-10-10 DIAGNOSIS — Z96651 Presence of right artificial knee joint: Secondary | ICD-10-CM | POA: Insufficient documentation

## 2014-10-10 DIAGNOSIS — D124 Benign neoplasm of descending colon: Secondary | ICD-10-CM | POA: Diagnosis not present

## 2014-10-10 DIAGNOSIS — D125 Benign neoplasm of sigmoid colon: Secondary | ICD-10-CM | POA: Insufficient documentation

## 2014-10-10 DIAGNOSIS — M1990 Unspecified osteoarthritis, unspecified site: Secondary | ICD-10-CM | POA: Diagnosis not present

## 2014-10-10 DIAGNOSIS — I739 Peripheral vascular disease, unspecified: Secondary | ICD-10-CM | POA: Insufficient documentation

## 2014-10-10 DIAGNOSIS — M810 Age-related osteoporosis without current pathological fracture: Secondary | ICD-10-CM | POA: Diagnosis not present

## 2014-10-10 DIAGNOSIS — F329 Major depressive disorder, single episode, unspecified: Secondary | ICD-10-CM | POA: Diagnosis not present

## 2014-10-10 DIAGNOSIS — Z09 Encounter for follow-up examination after completed treatment for conditions other than malignant neoplasm: Secondary | ICD-10-CM | POA: Diagnosis present

## 2014-10-10 DIAGNOSIS — Z87891 Personal history of nicotine dependence: Secondary | ICD-10-CM | POA: Insufficient documentation

## 2014-10-10 DIAGNOSIS — E785 Hyperlipidemia, unspecified: Secondary | ICD-10-CM | POA: Diagnosis not present

## 2014-10-10 DIAGNOSIS — Z86010 Personal history of colonic polyps: Secondary | ICD-10-CM | POA: Diagnosis not present

## 2014-10-10 DIAGNOSIS — K5790 Diverticulosis of intestine, part unspecified, without perforation or abscess without bleeding: Secondary | ICD-10-CM | POA: Diagnosis not present

## 2014-10-10 DIAGNOSIS — M549 Dorsalgia, unspecified: Secondary | ICD-10-CM | POA: Insufficient documentation

## 2014-10-10 DIAGNOSIS — M797 Fibromyalgia: Secondary | ICD-10-CM | POA: Insufficient documentation

## 2014-10-10 DIAGNOSIS — Z8371 Family history of colonic polyps: Secondary | ICD-10-CM | POA: Diagnosis not present

## 2014-10-10 DIAGNOSIS — I10 Essential (primary) hypertension: Secondary | ICD-10-CM | POA: Insufficient documentation

## 2014-10-10 DIAGNOSIS — Z7901 Long term (current) use of anticoagulants: Secondary | ICD-10-CM | POA: Diagnosis not present

## 2014-10-10 DIAGNOSIS — G8929 Other chronic pain: Secondary | ICD-10-CM | POA: Insufficient documentation

## 2014-10-10 DIAGNOSIS — I272 Other secondary pulmonary hypertension: Secondary | ICD-10-CM | POA: Insufficient documentation

## 2014-10-10 DIAGNOSIS — Z7951 Long term (current) use of inhaled steroids: Secondary | ICD-10-CM | POA: Diagnosis not present

## 2014-10-10 DIAGNOSIS — D126 Benign neoplasm of colon, unspecified: Secondary | ICD-10-CM

## 2014-10-10 DIAGNOSIS — Z9049 Acquired absence of other specified parts of digestive tract: Secondary | ICD-10-CM | POA: Diagnosis not present

## 2014-10-10 DIAGNOSIS — K635 Polyp of colon: Secondary | ICD-10-CM | POA: Diagnosis not present

## 2014-10-10 HISTORY — PX: HOT HEMOSTASIS: SHX5433

## 2014-10-10 HISTORY — PX: COLONOSCOPY WITH PROPOFOL: SHX5780

## 2014-10-10 LAB — GLUCOSE, CAPILLARY: Glucose-Capillary: 114 mg/dL — ABNORMAL HIGH (ref 70–99)

## 2014-10-10 SURGERY — COLONOSCOPY WITH PROPOFOL
Anesthesia: Monitor Anesthesia Care

## 2014-10-10 MED ORDER — RIVAROXABAN 20 MG PO TABS: 20.0000 mg | ORAL_TABLET | Freq: Every day | ORAL | Status: DC

## 2014-10-10 MED ORDER — LIDOCAINE HCL (CARDIAC) 20 MG/ML IV SOLN
INTRAVENOUS | Status: AC
  Filled 2014-10-10: qty 5

## 2014-10-10 MED ORDER — PROPOFOL INFUSION 10 MG/ML OPTIME
INTRAVENOUS | Status: DC | PRN
  Administered 2014-10-10: 100 ug/kg/min via INTRAVENOUS

## 2014-10-10 MED ORDER — LACTATED RINGERS IV SOLN
INTRAVENOUS | Status: DC | PRN
  Administered 2014-10-10: 09:00:00 via INTRAVENOUS

## 2014-10-10 MED ORDER — PROPOFOL 500 MG/50ML IV EMUL
INTRAVENOUS | Status: DC | PRN
  Administered 2014-10-10: 20 mg via INTRAVENOUS
  Administered 2014-10-10: 40 mg via INTRAVENOUS

## 2014-10-10 MED ORDER — PROPOFOL 10 MG/ML IV BOLUS
INTRAVENOUS | Status: AC
  Filled 2014-10-10: qty 20

## 2014-10-10 MED ORDER — SODIUM CHLORIDE 0.9 % IV SOLN: INTRAVENOUS | Status: DC

## 2014-10-10 SURGICAL SUPPLY — 22 items

## 2014-10-10 NOTE — H&P (Signed)
[facility] History and Physical   Primary Care Physician:  Marton Redwood, MD   Reason for Procedure:   Follow-up colon polyps  Plan:    Colonoscopy - The risks and benefits as well as alternatives of endoscopic procedure(s) have been discussed and reviewed. All questions answered. The patient agrees to proceed. Xarelto held.   HPI: Destiny Shelton is a 69 y.o. female with polyposis coli - attenuated. She is here for colonoscopy exam.   Past Medical History  Diagnosis Date  . ALLERGIC RHINITIS   . Hypertension   . Adenomatous colon polyp   . Gastritis   . Diverticulosis of colon   . Diabetes mellitus type 2 with neurological manifestations     On Insulin  . Depression   . Osteoporosis   . Morbidly obese   . Back pain, chronic   . Anxiety   . Hyperlipemia   . COPD (chronic obstructive pulmonary disease)     Requiring Home O2 at 4L  . GERD (gastroesophageal reflux disease)   . Urosepsis 05/26/2012  . Atrial tachycardia     Mostly Sinus Tachycardia  . PAF (paroxysmal atrial fibrillation)   . Osteoarthritis   . External hemorrhoids   . Carotid stenosis   . Nephrolithiasis   . Headache(784.0)     tension  . Neuromuscular disorder     diabetic neuropathy  . Fibromyalgia     "pain in arms and shoulders."  . Anemia     iron deficient  . Inappropriate sinus tachycardia   . 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  . Community acquired pneumonia - Recent Admission (07/2013) 07/08/2013  . Sleep apnea     no cpap machine  02 at 4l/min all the time    Past Surgical History  Procedure Laterality Date  . Appendectomy  1963  . Cholecystectomy  1963  . Abdominal hysterectomy  1978  . Total knee arthroplasty  1997    right  . Upper gastrointestinal endoscopy  04/09/2006    w/Dilation, gastritis  . Colonoscopy w/ biopsies and polypectomy  07/22/2007, 04/28/2007    2009: diverticulosis, 2008: diverticulosis, adenomatous polyps  . Tee without cardioversion  03/24/2012   Procedure: TRANSESOPHAGEAL ECHOCARDIOGRAM (TEE);  Surgeon: Pixie Casino, MD;  Location: [facility] OR;  Service: Cardiovascular;  Laterality: N/A;  TEE with Bubble  . Esophagogastroduodenoscopy (egd) with propofol N/A 10/01/2012    Procedure: ESOPHAGOGASTRODUODENOSCOPY (EGD) WITH PROPOFOL;  Surgeon: Gatha Mayer, MD;  Location: [facility] ENDOSCOPY;  Service: Endoscopy;  Laterality: N/A;  . Colonoscopy with propofol N/A 10/01/2012    Procedure: COLONOSCOPY WITH PROPOFOL;  Surgeon: Gatha Mayer, MD;  Location: [facility] ENDOSCOPY;  Service: Endoscopy;  Laterality: N/A;  . Colonoscopy N/A 12/21/2012    Procedure: COLONOSCOPY;  Surgeon: Gatha Mayer, MD;  Location: [facility] ENDOSCOPY;  Service: Endoscopy;  Laterality: N/A;  . Hot hemostasis N/A 12/21/2012    Procedure: HOT HEMOSTASIS (ARGON PLASMA COAGULATION/BICAP);  Surgeon: Gatha Mayer, MD;  Location: [facility];  Service: Endoscopy;  Laterality: N/A;  . Right and left cardiac catheterization  July 2013    RAP: 22 mmHg, and RVP: 46/16 mmHg, RV EDP 18; L. BP 110/11 mmHg, LVEDP 20 mmHg;  PAP 45/29 mmHg (mean 37 mmHg); PCWP 29/22 mmHg - 26 mmHg; no significant coronary disease  . Colonoscopy N/A 11/17/2013    Procedure: COLONOSCOPY;  Surgeon: Gatha Mayer, MD;  Location: [facility] ENDOSCOPY;  Service: Endoscopy;  Laterality: N/A;  . Left and right heart catheterization with coronary angiogram  12/19/2011  Procedure: LEFT AND RIGHT HEART CATHETERIZATION WITH CORONARY ANGIOGRAM;  Surgeon: Leonie Man, MD;  Location: [facility] CATH LAB;  Service: Cardiovascular;;    Prior to Admission medications   Medication Sig Start Date End Date Taking? Authorizing Provider  acetaminophen (TYLENOL) 500 MG tablet Take 1,000 mg by mouth every 6 (six) hours as needed for mild pain.   Yes Historical Provider, MD  albuterol (PROVENTIL) (2.5 MG/3ML) 0.083% nebulizer solution Take 3 mLs (2.5 mg total) by nebulization every 6 (six) hours as needed for wheezing or shortness of breath. 06/28/14  Yes  Kathee Delton, MD  ALPRAZolam Duanne Moron) 0.5 MG tablet Take 0.5-1 mg by mouth at bedtime as needed for anxiety or sleep.    Yes Historical Provider, MD  AMBULATORY NON FORMULARY MEDICATION Continuous O2 @@ 4LMP   Yes Historical Provider, MD  atorvastatin (LIPITOR) 40 MG tablet Take 1 tablet by mouth every evening.  06/06/13  Yes Historical Provider, MD  benzonatate (TESSALON) 200 MG capsule Take 400 mg by mouth 3 (three) times daily as needed for cough.    Yes Historical Provider, MD  buPROPion (WELLBUTRIN XL) 150 MG 24 hr tablet Take 150 mg by mouth at bedtime.    Yes Historical Provider, MD  CALCIUM-VITAMIN D PO Take 1 tablet by mouth 2 (two) times daily.    Yes Historical Provider, MD  docusate sodium (COLACE) 100 MG capsule Take 100 mg by mouth 2 (two) times daily.    Yes Historical Provider, MD  DULoxetine (CYMBALTA) 60 MG capsule Take 60 mg by mouth every morning.    Yes Historical Provider, MD  furosemide (LASIX) 40 MG tablet Take 80 mg by mouth 2 (two) times daily.  07/13/13  Yes Marton Redwood, MD  gabapentin (NEURONTIN) 600 MG tablet Take 600 mg by mouth 3 (three) times daily.    Yes Historical Provider, MD  HYDROcodone-acetaminophen (NORCO/VICODIN) 5-325 MG per tablet Take 1 tablet by mouth every 4 (four) hours as needed for moderate pain.    Yes Historical Provider, MD  insulin aspart (NOVOLOG) 100 UNIT/ML injection Inject 10 Units into the skin 2 (two) times daily.    Yes Historical Provider, MD  insulin glargine (LANTUS) 100 UNIT/ML injection Inject 70 Units into the skin daily before breakfast. 06/01/12  Yes Marton Redwood, MD  irbesartan (AVAPRO) 150 MG tablet TAKE 1 TABLET BY MOUTH DAILY 06/27/14  Yes Leonie Man, MD  iron polysaccharides (NIFEREX) 150 MG capsule Take 1 capsule (150 mg total) by mouth daily. 06/01/12  Yes Marton Redwood, MD  levalbuterol [facility] HFA) 45 MCG/ACT inhaler Inhale 1-2 puffs into the lungs every 4 (four) hours as needed for wheezing. 06/01/12  Yes Marton Redwood,  MD  Multiple Vitamin (MULTIVITAMIN) tablet Take 1 tablet by mouth every morning.    Yes Historical Provider, MD  Na Sulfate-K Sulfate-Mg Sulf (SUPREP BOWEL PREP) SOLN Use as directed 08/21/14  Yes Gatha Mayer, MD  Omega-3 Fatty Acids (FISH OIL) 1000 MG CAPS Take 1 capsule by mouth 3 (three) times daily.    Yes Historical Provider, MD  omeprazole (PRILOSEC) 20 MG capsule Take 20 mg by mouth at bedtime.    Yes Historical Provider, MD  ONE TOUCH ULTRA TEST test strip 1 each by Other route 3 (three) times daily.  07/10/14  Yes Historical Provider, MD  polyethylene glycol (MIRALAX / GLYCOLAX) packet Take 17 g by mouth every morning.    Yes Historical Provider, MD  polyvinyl alcohol (LIQUIFILM TEARS) 1.4 % ophthalmic solution Place  1 drop into both eyes as needed for dry eyes.   Yes Historical Provider, MD  potassium chloride (KLOR-CON) 10 MEQ CR tablet Take 10 mEq by mouth 3 (three) times daily.    Yes Historical Provider, MD  rivaroxaban (XARELTO) 20 MG TABS tablet Take 1 tablet (20 mg total) by mouth daily with supper. Restart on Saturday June 20. 11/17/13  Yes Gatha Mayer, MD  roflumilast (DALIRESP) 500 MCG TABS tablet Take 500 mcg by mouth every morning.    Yes Historical Provider, MD  SPIRIVA HANDIHALER 18 MCG inhalation capsule INHALE THE CONTENTS OF 1 CAPSULE BY MOUTH VIA HANDIHALER DAILY 07/25/14  Yes Kathee Delton, MD  SYMBICORT 160-4.5 MCG/ACT inhaler INHALE 2 PUFFS BY MOUTH INTO THE LUNGS TWICE DAILY 05/11/14  Yes Kathee Delton, MD  verapamil (VERELAN PM) 360 MG 24 hr capsule TAKE ONE CAPSULE BY MOUTH DAILY 07/25/14  Yes Leonie Man, MD    Current Facility-Administered Medications  Medication Dose Route Frequency Provider Last Rate Last Dose  . 0.9 %  sodium chloride infusion   Intravenous Continuous Gatha Mayer, MD        Allergies as of 08/21/2014 - Review Complete 08/21/2014  Allergen Reaction Noted  . Aspirin Shortness Of Breath   . Nsaids Shortness Of Breath     Family  History  Problem Relation Age of Onset  . Heart disease Mother   . Stroke Mother   . Heart disease Father   . Heart disease Brother   . Stroke Brother   . Skin cancer Brother   . Colon cancer Neg Hx   . Colon polyps Sister   . Diabetes Sister   . Diabetes Brother   . Irritable bowel syndrome Daughter     History   Social History  . Marital Status: Married    Spouse Name: N/A  . Number of Children: 2  . Years of Education: HS   Occupational History  . Disability    Social History Main Topics  . Smoking status: Former Smoker -- 2.00 packs/day for 30 years    Types: Cigarettes    Quit date: 06/02/1994  . Smokeless tobacco: Never Used  . Alcohol Use: No  . Drug Use: No  . Sexual Activity: No   Other Topics Concern  . Not on file   Social History Narrative   She is a married mother of 2, grandmother of 41, great-grandmother of 2. She is with her husband here today. She does not really exercise as she is on home oxygen and has baseline dyspnea.    Does not smoke. Does not drink. She quit smoking somewhere between 10-15 years ago. She is now trying to work on picking up her activity level. She is trying to I think more than likely get back into pulmonary rehabilitation potentially.       Daily caffeine.     Review of Systems:  All other review of systems negative except as mentioned in the HPI.  Physical Exam: Vital signs in last 24 hours: Temp:  [98 F (36.7 C)] 98 F (36.7 C) (05/10 0839) Pulse Rate:  [105] 105 (05/10 0839) Resp:  [20] 20 (05/10 0839) BP: (155)/(51) 155/51 mmHg (05/10 0839) Weight:  [225 lb (102.059 kg)] 225 lb (102.059 kg) (05/10 0839)   General:   Alert,  Well-developed, well-nourished, pleasant and cooperative in NAD Lungs:  Clear throughout to auscultation.   Heart:  Regular rate and rhythm; no murmurs, clicks, rubs,  or  gallops. Abdomen:  Soft, nontender and nondistended. Normal bowel sounds.   Neuro/Psych:  Alert and cooperative. Normal  mood and affect. A and O x 3   @Saralee Bolick  Simonne Maffucci, MD, [facility] [phone] (pager) 10/10/2014 9:16 AM@

## 2014-10-10 NOTE — Discharge Instructions (Addendum)
° °  The area of the large polyp showed what looked like just a small polyp left. I removed that area and then ablated or cauterized it.  I found 2 other small polyps that were removed.  You still have diverticulosis.  I will call or send a letter about the results and plans but do not think anything for at least a year if not longer.   RESTART Earnestine Mealing  I appreciate the opportunity to care for you. Gatha Mayer, MD, FACG  YOU HAD AN ENDOSCOPIC PROCEDURE TODAY: Refer to the procedure report and other information in the discharge instructions given to you for any specific questions about what was found during the examination. If this information does not answer your questions, please call Dr. Celesta Aver office at [phone] to clarify.   YOU SHOULD EXPECT: Some feelings of bloating in the abdomen. Passage of more gas than usual. Walking can help get rid of the air that was put into your GI tract during the procedure and reduce the bloating. If you had a lower endoscopy (such as a colonoscopy or flexible sigmoidoscopy) you may notice spotting of blood in your stool or on the toilet paper. Some abdominal soreness may be present for a day or two, also.  DIET: Your first meal following the procedure should be a light meal and then it is ok to progress to your normal diet. A half-sandwich or bowl of soup is an example of a good first meal. Heavy or fried foods are harder to digest and may make you feel nauseous or bloated. Drink plenty of fluids but you should avoid alcoholic beverages for 24 hours.   ACTIVITY: Your care partner should take you home directly after the procedure. You should plan to take it easy, moving slowly for the rest of the day. You can resume normal activity the day after the procedure however YOU SHOULD NOT DRIVE, use power tools, machinery or perform tasks that involve climbing or major physical exertion for 24 hours (because of the sedation medicines used during  the test).   SYMPTOMS TO REPORT IMMEDIATELY: A gastroenterologist can be reached at any hour. Please call [phone]  for any of the following symptoms:  Following lower endoscopy (colonoscopy, flexible sigmoidoscopy) Excessive amounts of blood in the stool  Significant tenderness, worsening of abdominal pains  Swelling of the abdomen that is new, acute  Fever of 100 or higher    FOLLOW UP:  If any biopsies were taken you will be contacted by phone or by letter within the next 1-3 weeks. Call [phone]  if you have not heard about the biopsies in 3 weeks.  Please also call with any specific questions about appointments or follow up tests.

## 2014-10-10 NOTE — Op Note (Signed)
[facility] [place], [address]   COLONOSCOPY PROCEDURE REPORT  PATIENT: Destiny Shelton, Destiny Shelton  MR#: [id number] BIRTHDATE: [birth year] , 68  yrs. old GENDER: female ENDOSCOPIST: Gatha Mayer, MD, [facility] PROCEDURE DATE:  10/10/2014 PROCEDURE:   Colonoscopy, surveillance , Colonoscopy with biopsy, Colonoscopy with snare polypectomy, and Colonoscopy with ablation First Screening Colonoscopy - Avg.  risk and is 50 yrs.  old or older - No.  Prior Negative Screening - Now for repeat screening. N/A  History of Adenoma - Now for follow-up colonoscopy & has been > or = to 3 yrs.  No.  It has been less than 3 yrs since last colonoscopy.  Medical reason.  Polyps Removed Today ASA CLASS:   Class IV INDICATIONS:Surveillance due to prior colonic neoplasia and PH Colon Adenoma. MEDICATIONS: Monitored anesthesia care and Per Anesthesia  DESCRIPTION OF PROCEDURE:   After the risks benefits and alternatives of the procedure were thoroughly explained, informed consent was obtained.  The digital rectal exam revealed no abnormalities of the rectum.   The Pentax Ped Colon [id number] endoscope was introduced through the anus and advanced to the cecum, which was identified by both the appendix and ileocecal valve. No adverse events experienced.   The quality of the prep was good.  (Suprep was used)  The instrument was then slowly withdrawn as the colon was fully examined.  COLON FINDINGS: Two sessile polyps ranging from 4 to 38mm in size were found in the sigmoid colon and transverse colon. Polypectomies were performed with a cold snare.  The resection was complete, the polyp tissue was completely retrieved and sent to histology.   A smooth flat polyp ranging from 3 to 87mm in size was found in the sigmoid colon.  Multiple biopsies were performed using cold forceps.  ? residual polyp removed with multiple biopsies. Destruction of tissue via ablation was attempted.  There was  no blood loss from maneuver.  Argon plasma coagulation was used.  Care was given to ensure that the lumen was suctioned well.   There was severe diverticulosis noted in the sigmoid colon.   The examination was otherwise normal.  Retroflexed views revealed no abnormalities. The time to cecum = 7.8 Withdrawal time = 17.8   The scope was withdrawn and the procedure completed. COMPLICATIONS: There were no immediate complications. ENDOSCOPIC IMPRESSION: 1.   Two sessile polyps ranging from 4 to 55mm in size were found in the sigmoid colon and transverse colon; polypectomies were performed with a cold snare 2.   Flat polyp ranging from 3 to 36mm in size was found in the sigmoid colon; multiple biopsies were performed using cold forceps; Destruction of tissue via ablation was attempted - in area of tattoos - previous large polyp with high-grade dysplasia 3.   Severe diverticulosis was noted in the sigmoid colon 4.   The examination was otherwise normal - good prep  RECOMMENDATIONS: Resume Xarelto tomorrow Will call/mail results and plans eSigned:  Gatha Mayer, MD, [facility] 10/10/2014 10:31 AM cc:: W. Lutricia Feil, MD and [employer]

## 2014-10-10 NOTE — Anesthesia Postprocedure Evaluation (Signed)
  Anesthesia Post-op Note  Patient: Destiny Shelton  Procedure(s) Performed: Procedure(s) (LRB): COLONOSCOPY WITH PROPOFOL (N/A) HOT HEMOSTASIS (ARGON PLASMA COAGULATION/BICAP) (N/A)  Patient Location: PACU  Anesthesia Type: MAC  Level of Consciousness: awake and alert   Airway and Oxygen Therapy: Patient Spontanous Breathing  Post-op Pain: mild  Post-op Assessment: Post-op Vital signs reviewed, Patient's Cardiovascular Status Stable, Respiratory Function Stable, Patent Airway and No signs of Nausea or vomiting  Last Vitals:  Filed Vitals:   10/10/14 1030  BP: 154/74  Pulse: 99  Temp:   Resp: 21    Post-op Vital Signs: stable   Complications: No apparent anesthesia complications

## 2014-10-10 NOTE — Transfer of Care (Signed)
Immediate Anesthesia Transfer of Care Note  Patient: Destiny Shelton  Procedure(s) Performed: Procedure(s): COLONOSCOPY WITH PROPOFOL (N/A) HOT HEMOSTASIS (ARGON PLASMA COAGULATION/BICAP) (N/A)  Patient Location: PACU  Anesthesia Type:MAC  Level of Consciousness: awake, alert  and oriented  Airway & Oxygen Therapy: Patient Spontanous Breathing and Patient connected to nasal cannula oxygen  Post-op Assessment: Report given to RN and Post -op Vital signs reviewed and stable  Post vital signs: Reviewed and stable  Last Vitals:  Filed Vitals:   10/10/14 0839  BP: 155/51  Pulse: 105  Temp: 36.7 C  Resp: 20    Complications: No apparent anesthesia complications

## 2014-10-10 NOTE — Anesthesia Preprocedure Evaluation (Addendum)
Anesthesia Evaluation  Patient identified by MRN, date of birth, ID band Patient awake    Reviewed: Allergy & Precautions, NPO status , Patient's Chart, lab work & pertinent test results  Airway Mallampati: II  TM Distance: >3 FB Neck ROM: Full    Dental  (+) Missing,    Pulmonary shortness of breath and with exertion, sleep apnea , pneumonia -, resolved, COPD COPD inhaler and oxygen dependent, former smoker,  breath sounds clear to auscultation  Pulmonary exam normal       Cardiovascular Exercise Tolerance: Poor hypertension, Pt. on medications + Peripheral Vascular Disease Normal cardiovascular examRhythm:Regular Rate:Normal  ECHO 2013: reviewed Pulmonary HTN  She saw her cardiologist in February 2016 She saw her pulmonologist in January 2016   Neuro/Psych  Headaches, PSYCHIATRIC DISORDERS Anxiety Depression  Neuromuscular disease    GI/Hepatic Neg liver ROS, GERD-  Medicated,  Endo/Other  diabetes, Type 2, Oral Hypoglycemic Agents, Insulin Dependent  Renal/GU Renal disease  negative genitourinary   Musculoskeletal  (+) Arthritis -, Fibromyalgia -  Abdominal (+) + obese,   Peds negative pediatric ROS (+)  Hematology  (+) anemia ,   Anesthesia Other Findings   Reproductive/Obstetrics negative OB ROS                         Anesthesia Physical Anesthesia Plan  ASA: IV  Anesthesia Plan: MAC   Post-op Pain Management:    Induction: Intravenous  Airway Management Planned: Natural Airway  Additional Equipment:   Intra-op Plan:   Post-operative Plan:   Informed Consent: I have reviewed the patients History and Physical, chart, labs and discussed the procedure including the risks, benefits and alternatives for the proposed anesthesia with the patient or authorized representative who has indicated his/her understanding and acceptance.   Dental advisory given  Plan Discussed with:  CRNA  Anesthesia Plan Comments:        Anesthesia Quick Evaluation

## 2014-10-11 ENCOUNTER — Encounter (HOSPITAL_COMMUNITY): Payer: Self-pay | Admitting: Internal Medicine

## 2014-10-13 ENCOUNTER — Encounter: Payer: Self-pay | Admitting: Internal Medicine

## 2014-10-13 ENCOUNTER — Telehealth: Payer: Self-pay

## 2014-10-13 NOTE — Telephone Encounter (Signed)
-----   Message from Gatha Mayer, MD sent at 10/13/2014  1:48 PM EDT ----- I forgot to change those comments - the body of the letter is correct.  I cannot see those comments when I do my letter and sometimes forget to change them ----- Message -----    From: Marlon Pel, RN    Sent: 10/13/2014  11:28 AM      To: Gatha Mayer, MD  On the path report you said to have office visit recall in 1 year.  On the letter to the patient it says the same.  But on the comments of the letter it is 3-6 month recall.  Am I doing a recall for the office visit in 1 year, but a colon recall for 3-6 months????

## 2014-10-13 NOTE — Telephone Encounter (Signed)
Recall for an office visit is entered for 1 year

## 2014-10-13 NOTE — Progress Notes (Signed)
Quick Note:  Polyps benign 2 adenomas and residual sigmoid adenoma Office visit recall 1 year 2017 Dr. Lang Snow needs faxed copy of path report and colonoscopy report please ______

## 2014-10-18 DIAGNOSIS — Z961 Presence of intraocular lens: Secondary | ICD-10-CM | POA: Diagnosis not present

## 2014-10-18 DIAGNOSIS — Z794 Long term (current) use of insulin: Secondary | ICD-10-CM | POA: Diagnosis not present

## 2014-10-18 DIAGNOSIS — H18413 Arcus senilis, bilateral: Secondary | ICD-10-CM | POA: Diagnosis not present

## 2014-10-18 DIAGNOSIS — H40013 Open angle with borderline findings, low risk, bilateral: Secondary | ICD-10-CM | POA: Diagnosis not present

## 2014-10-18 DIAGNOSIS — H26493 Other secondary cataract, bilateral: Secondary | ICD-10-CM | POA: Diagnosis not present

## 2014-10-18 DIAGNOSIS — H04123 Dry eye syndrome of bilateral lacrimal glands: Secondary | ICD-10-CM | POA: Diagnosis not present

## 2014-10-18 DIAGNOSIS — H3589 Other specified retinal disorders: Secondary | ICD-10-CM | POA: Diagnosis not present

## 2014-10-18 DIAGNOSIS — H5201 Hypermetropia, right eye: Secondary | ICD-10-CM | POA: Diagnosis not present

## 2014-10-18 DIAGNOSIS — H52223 Regular astigmatism, bilateral: Secondary | ICD-10-CM | POA: Diagnosis not present

## 2014-10-18 DIAGNOSIS — H11153 Pinguecula, bilateral: Secondary | ICD-10-CM | POA: Diagnosis not present

## 2014-11-01 DIAGNOSIS — M810 Age-related osteoporosis without current pathological fracture: Secondary | ICD-10-CM | POA: Diagnosis not present

## 2014-11-07 DIAGNOSIS — H26492 Other secondary cataract, left eye: Secondary | ICD-10-CM | POA: Diagnosis not present

## 2014-11-07 DIAGNOSIS — H4011X2 Primary open-angle glaucoma, moderate stage: Secondary | ICD-10-CM | POA: Diagnosis not present

## 2014-11-07 DIAGNOSIS — H26493 Other secondary cataract, bilateral: Secondary | ICD-10-CM | POA: Diagnosis not present

## 2014-11-07 DIAGNOSIS — Z961 Presence of intraocular lens: Secondary | ICD-10-CM | POA: Diagnosis not present

## 2014-11-07 DIAGNOSIS — H40013 Open angle with borderline findings, low risk, bilateral: Secondary | ICD-10-CM | POA: Diagnosis not present

## 2014-11-14 DIAGNOSIS — H26491 Other secondary cataract, right eye: Secondary | ICD-10-CM | POA: Diagnosis not present

## 2014-11-15 DIAGNOSIS — H26491 Other secondary cataract, right eye: Secondary | ICD-10-CM | POA: Diagnosis not present

## 2014-11-22 DIAGNOSIS — H26493 Other secondary cataract, bilateral: Secondary | ICD-10-CM | POA: Diagnosis not present

## 2014-11-27 ENCOUNTER — Other Ambulatory Visit: Payer: Self-pay

## 2014-12-14 DIAGNOSIS — F329 Major depressive disorder, single episode, unspecified: Secondary | ICD-10-CM | POA: Diagnosis not present

## 2014-12-14 DIAGNOSIS — I10 Essential (primary) hypertension: Secondary | ICD-10-CM | POA: Diagnosis not present

## 2014-12-14 DIAGNOSIS — D649 Anemia, unspecified: Secondary | ICD-10-CM | POA: Diagnosis not present

## 2014-12-14 DIAGNOSIS — J449 Chronic obstructive pulmonary disease, unspecified: Secondary | ICD-10-CM | POA: Diagnosis not present

## 2014-12-14 DIAGNOSIS — I4891 Unspecified atrial fibrillation: Secondary | ICD-10-CM | POA: Diagnosis not present

## 2014-12-14 DIAGNOSIS — I509 Heart failure, unspecified: Secondary | ICD-10-CM | POA: Diagnosis not present

## 2014-12-14 DIAGNOSIS — E1149 Type 2 diabetes mellitus with other diabetic neurological complication: Secondary | ICD-10-CM | POA: Diagnosis not present

## 2014-12-14 DIAGNOSIS — Z1389 Encounter for screening for other disorder: Secondary | ICD-10-CM | POA: Diagnosis not present

## 2014-12-14 DIAGNOSIS — Z6835 Body mass index (BMI) 35.0-35.9, adult: Secondary | ICD-10-CM | POA: Diagnosis not present

## 2014-12-14 DIAGNOSIS — E785 Hyperlipidemia, unspecified: Secondary | ICD-10-CM | POA: Diagnosis not present

## 2014-12-14 DIAGNOSIS — Z7901 Long term (current) use of anticoagulants: Secondary | ICD-10-CM | POA: Diagnosis not present

## 2014-12-27 ENCOUNTER — Ambulatory Visit: Payer: Medicare Other | Admitting: Pulmonary Disease

## 2014-12-28 ENCOUNTER — Ambulatory Visit (INDEPENDENT_AMBULATORY_CARE_PROVIDER_SITE_OTHER)
Admission: RE | Admit: 2014-12-28 | Discharge: 2014-12-28 | Disposition: A | Discharge status: Home / Self Care | Payer: Medicare Other | Source: Ambulatory Visit | Attending: Internal Medicine | Admitting: Internal Medicine

## 2014-12-28 ENCOUNTER — Ambulatory Visit (INDEPENDENT_AMBULATORY_CARE_PROVIDER_SITE_OTHER): Payer: Medicare Other | Admitting: Internal Medicine

## 2014-12-28 ENCOUNTER — Encounter: Payer: Self-pay | Admitting: Internal Medicine

## 2014-12-28 VITALS — BP 116/60 | HR 107 | Ht 65.0 in | Wt 223.2 lb

## 2014-12-28 DIAGNOSIS — J9611 Chronic respiratory failure with hypoxia: Secondary | ICD-10-CM

## 2014-12-28 DIAGNOSIS — I272 Pulmonary hypertension, unspecified: Secondary | ICD-10-CM

## 2014-12-28 DIAGNOSIS — J449 Chronic obstructive pulmonary disease, unspecified: Secondary | ICD-10-CM

## 2014-12-28 DIAGNOSIS — I270 Primary pulmonary hypertension: Secondary | ICD-10-CM | POA: Diagnosis not present

## 2014-12-28 NOTE — Patient Instructions (Signed)
Plan A = Automatic = Symbicort 160 Take 2 puffs first thing in am and then another 2 puffs about 12 hours later.                                      Spiriva  Plan B=  Only use your levoalbuterol (xopenex) as a rescue medication to be used if you can't catch your breath by resting or doing a relaxed purse lip breathing pattern.  - The less you use it, the better it will work when you need it. - Ok to use up to 2 puffs  every 4 hours if you must but call for immediate appointment if use goes up over your usual need - Don't leave home without it !!  (think of it like the spare tire for your car)   Plan C = Crisis - only do this if you try plan B 1st and it doesn't work / albuterol neb  2.5 mg up to every 4 hours if you must   Plan D = Doctor call if plan C doesn't work  Plan E = Emergency room , go there if all else fails  Please remember to go to the   x-ray department downstairs for your tests - we will call you with the results when they are available.  Walk on 5lpm/ do 4lpm otherwise.   Please schedule a follow up office visit in 6 weeks, call sooner if needed with Dr Janifer Adie replacement

## 2014-12-28 NOTE — Progress Notes (Signed)
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 + response to daliresp per pt Referred to pulmonary rehab. > poor tolerance maint on 4lpm 24/7 but up to 5lpm walking    12/28/2014 not prev seen by Amiir Heckard/ov  re:  GOLD II copd/  chronic resp failure on 4lpm sitting/ 5 lpm  Chief Complaint  Patient presents with  . Follow-up    Former pt of Dr Gwenette Greet.  She c/o increased DOE over the past 2 months. She gets winded walking around her home.  She uses xopenex inhaler 2 x daily on average and uses neb with albuterol every am.   confused between maint and prns and loosing ground on "daily" saba x 2 m  No obvious day to day or daytime variability or assoc chronic cough or cp or chest tightness, subjective wheeze or overt sinus or hb symptoms. No unusual exp hx or h/o childhood pna/ asthma or knowledge of premature birth.  Sleeping ok with HOB 30 degrees  without nocturnal  or early am exacerbation  of respiratory  c/o's or need for noct saba. Also denies any obvious fluctuation of symptoms with weather or environmental changes or other aggravating or alleviating factors except as outlined above   Current Medications, Allergies, Complete Past Medical History, Past Surgical History, Family History, and Social History were reviewed in [employer] record.  ROS  The following are not active complaints unless bolded sore throat, dysphagia, dental problems, itching, sneezing,  nasal congestion or excess/ purulent secretions, ear ache,   fever, chills, sweats, unintended wt loss, classically pleuritic or exertional cp, hemoptysis,  orthopnea pnd or leg swelling, presyncope, palpitations, abdominal pain, anorexia, nausea,  vomiting, diarrhea  or change in bowel or bladder habits, change in stools or urine, dysuria,hematuria,  rash, arthralgias, visual complaints, headache, numbness, weakness or ataxia or problems with walking or coordination,  change in mood/affect or memory.             Review of Systems     Objective:   Physical Exam     Wt Readings from Last 3 Encounters:  12/28/14 223 lb 3.2 oz (101.243 kg)  10/10/14 225 lb (102.059 kg)  08/21/14 225 lb 8 oz (102.286 kg)    Vital signs reviewed   HEENT: nl dentition, turbinates, and orophanx. Nl external ear canals without cough reflex   NECK :  without JVD/Nodes/TM/ nl carotid upstrokes bilaterally   LUNGS: no acc muscle use, clear to A and P bilaterally without cough on insp or exp maneuvers   CV:  RRR  no s3 or murmur or increase in P2, no edema   ABD:  soft and nontender with nl excursion in the supine position. 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 No new  opacity. No change in cardiac silhouette. Assessment:

## 2014-12-29 ENCOUNTER — Telehealth: Payer: Self-pay | Admitting: *Deleted

## 2014-12-29 ENCOUNTER — Encounter: Payer: Self-pay | Admitting: Internal Medicine

## 2014-12-29 NOTE — Assessment & Plan Note (Signed)
Echo 2012: nl LV and EF, impaired relaxation, nothing to suggest pulm htn. Myoview 2012: no ischemia LHC 12/2011: non-obstructive cad RHC 12/2011:  PA 45/29 with mean 68mm, PCWP 22, CO Fick 10.25, PVR 1.17 wood units, slight step up in saturation from   RV to PA [place] 12/2011:  Equal 4 chamber pressures, but no square root sign.   Echo 6/97/9480:  Nl LV, diastolic dysfxn, normal RV, estimated RVSP 46.  TEE 03/2012:  A few late bubbles seen felt c/w small IP shunting.  CT chest 12/2011:  No PE, very mild mosaic perfusion abnl (prob secondary to her known airflow obstruction).  Cardiac MRI 2013:  No infiltrative process.  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   Last cards note reviewed from 08/22/14 and rec is for 6lpm walking which she is not doing Also no comment on the intraccardiac component of the problem > will ask her to see Dr Ellyn Hack to close this loop and in meantime use 6lpm walking

## 2014-12-29 NOTE — Assessment & Plan Note (Signed)
Body mass index is 37.14 kg/(m^2).  Lab Results  Component Value Date   TSH 0.246* 05/26/2012     Contributing to diastolic dysfunction / aodm/gerd tendency/ doe/reviewed need  achieve and maintain neg calorie balance > defer f/u primary care including intermittently monitoring thyroid status

## 2014-12-29 NOTE — Progress Notes (Signed)
Quick Note:  Spoke with pt and notified of results per Dr. Wert. Pt verbalized understanding and denied any questions.  ______ 

## 2014-12-29 NOTE — Telephone Encounter (Signed)
Spoke with the pt and notified of recs per MW  She verbalized understanding  Nothing further needed 

## 2014-12-29 NOTE — Assessment & Plan Note (Signed)
12/28/2014   Walked 5lpm x one lap @ 185 stopped due to  desat to 86% > rec 6lpm walking/ o/w 4lpm   ? If shunting worse with walking desats ie the lower the sats/ the higher the PA pressure, the greater the R to L shunt ?

## 2014-12-29 NOTE — Telephone Encounter (Signed)
-----   Message from Tanda Rockers, MD sent at 12/29/2014  9:29 AM EDT ----- Ask her to use 6lpm walking more than across the room, and 4lpm 24/7 otherwise

## 2014-12-29 NOTE — Assessment & Plan Note (Signed)
pfts 2011:  FEV1 1.34 (55%), ratio 68, ++airtrapping on lung volumes, nl TLC, DLCO 40% pred. + response to daliresp per pt Referred to pulmonary rehab. > poor tolerance   The proper method of use, as well as anticipated side effects, of a metered-dose inhaler are discussed and demonstrated to the patient. Improved effectiveness after extensive coaching during this visit to a level of approximately   75% so needs work using hfa or change to dpi   I had an extended discussion with the patient reviewing all relevant studies completed to date and  lasting 25 minutes of a 40  minute visit    Plan A thru E presented to pt and husband   Each maintenance medication was reviewed in detail including most importantly the difference between maintenance and prns and under what circumstances the prns are to be triggered using an action plan format that is not reflected in the computer generated alphabetically organized AVS.    Please see instructions for details which were reviewed in writing and the patient given a copy highlighting the part that I personally wrote and discussed at today's ov.

## 2015-01-01 ENCOUNTER — Telehealth: Payer: Self-pay | Admitting: Internal Medicine

## 2015-01-01 DIAGNOSIS — I272 Pulmonary hypertension, unspecified: Secondary | ICD-10-CM

## 2015-01-01 NOTE — Telephone Encounter (Signed)
Spoke with the [place]  She states that she is needing order sent to another DME Pt is not est with [facility]  She states that she send Rodena Piety all of the information in a staff msg  Rodena Piety please advise info needed and I will place a new order

## 2015-01-01 NOTE — Telephone Encounter (Signed)
Spoke with the pt  She states needs o2 concentrator that will go up to 5 lpm  She has been more active around the home and MW advised to increase from 4 to 5 when walking  I have sent order to [facility]  Nothing further needed per pt

## 2015-01-02 ENCOUNTER — Telehealth: Payer: Self-pay | Admitting: Internal Medicine

## 2015-01-02 NOTE — Telephone Encounter (Signed)
Pt is upset. Pt states we sent to wrong order for her o2 to the wrong DME [facility]).  Her poc does not go up to 6L. Please call pt back at [phone]

## 2015-01-02 NOTE — Telephone Encounter (Signed)
[phone], Betsy cb

## 2015-01-02 NOTE — Telephone Encounter (Signed)
Destiny Shelton returned call [phone]

## 2015-01-02 NOTE — Telephone Encounter (Signed)
ATC Betsy X 3. Line doesn't ring and states the VM has not been set up yet. WCB.

## 2015-01-02 NOTE — Telephone Encounter (Signed)
LMTCB

## 2015-01-02 NOTE — Telephone Encounter (Signed)
Spoke with the pt  She states that she was told by [facility] to "just use the 4 liters" I called [facility] and asked that they provide the pt with a concentrator that goes higher than 4  It is clearly stated in the order that we sent that she needs up to 5 lpm  Gwinda Passe states this will be taken care of today  Pt aware

## 2015-01-02 NOTE — Telephone Encounter (Signed)
Called Betsy with [facility]. Betsy wanted to give FYI that they are providing the pt with the needed concentrator but not the portable liquid concentrator as this is being provided be another [place] at [phone]. Nothing further needed at this time.

## 2015-01-09 DIAGNOSIS — Z79899 Other long term (current) drug therapy: Secondary | ICD-10-CM | POA: Diagnosis not present

## 2015-01-09 DIAGNOSIS — Z6835 Body mass index (BMI) 35.0-35.9, adult: Secondary | ICD-10-CM | POA: Diagnosis not present

## 2015-01-09 DIAGNOSIS — M810 Age-related osteoporosis without current pathological fracture: Secondary | ICD-10-CM | POA: Diagnosis not present

## 2015-01-24 ENCOUNTER — Telehealth: Payer: Self-pay | Admitting: Internal Medicine

## 2015-01-24 MED ORDER — BUDESONIDE-FORMOTEROL FUMARATE 160-4.5 MCG/ACT IN AERO: INHALATION_SPRAY | RESPIRATORY_TRACT | Status: DC

## 2015-01-24 NOTE — Telephone Encounter (Signed)
RX for symbicort has been sent in. Nothing further needed

## 2015-01-25 ENCOUNTER — Other Ambulatory Visit: Payer: Self-pay | Admitting: *Deleted

## 2015-01-25 MED ORDER — BUDESONIDE-FORMOTEROL FUMARATE 160-4.5 MCG/ACT IN AERO: INHALATION_SPRAY | RESPIRATORY_TRACT | Status: DC

## 2015-01-26 ENCOUNTER — Other Ambulatory Visit (HOSPITAL_COMMUNITY): Payer: Self-pay | Admitting: Internal Medicine

## 2015-01-29 ENCOUNTER — Ambulatory Visit (INDEPENDENT_AMBULATORY_CARE_PROVIDER_SITE_OTHER): Payer: Medicare Other | Admitting: Cardiology

## 2015-01-29 ENCOUNTER — Encounter: Payer: Self-pay | Admitting: Cardiology

## 2015-01-29 VITALS — BP 120/60 | HR 107 | Ht 65.0 in | Wt 223.0 lb

## 2015-01-29 DIAGNOSIS — I5032 Chronic diastolic (congestive) heart failure: Secondary | ICD-10-CM

## 2015-01-29 DIAGNOSIS — I480 Paroxysmal atrial fibrillation: Secondary | ICD-10-CM

## 2015-01-29 DIAGNOSIS — R000 Tachycardia, unspecified: Secondary | ICD-10-CM

## 2015-01-29 DIAGNOSIS — I10 Essential (primary) hypertension: Secondary | ICD-10-CM

## 2015-01-29 DIAGNOSIS — IMO0001 Reserved for inherently not codable concepts without codable children: Secondary | ICD-10-CM

## 2015-01-29 DIAGNOSIS — I471 Supraventricular tachycardia: Secondary | ICD-10-CM

## 2015-01-29 DIAGNOSIS — R5383 Other fatigue: Secondary | ICD-10-CM

## 2015-01-29 DIAGNOSIS — I272 Pulmonary hypertension, unspecified: Secondary | ICD-10-CM

## 2015-01-29 DIAGNOSIS — I270 Primary pulmonary hypertension: Secondary | ICD-10-CM

## 2015-01-29 MED ORDER — DIGOXIN 125 MCG PO TABS: 0.1250 mg | ORAL_TABLET | Freq: Every day | ORAL | Status: DC

## 2015-01-29 NOTE — Progress Notes (Signed)
PCP: Marton Redwood, MD  Clinic Note: Chief Complaint  Patient presents with  . 6 month follow up    Patient has felt light headed, dizzy, and has had swelling in her feet and ankles.  . Shortness of Breath  . Atrial Fibrillation  . Tachycardia    HPI: Destiny Shelton is a 69 y.o. female with a Cardiovascular Problem List below who presents today for a six-month followup for paroxysmal A. fib, presents tachycardia, atrial tachycardia, chronic diastolic heart failure and hypertension along with oxygen requiring COPD.  She is limited to how much exertion she can do and has chronic exertional dyspnea. She has a rapid resting heart rate but it does get them at home into the 80s.  She was very symptomatic when she had atrial fibrillation in the past.  She was last seen In February 2016- was essentially "stable at that time".  No new studies to review. No recent hospitalizations.  Interval History:  Destiny Shelton presents today for routine followup. She says that she is much at her baseline breathing but is just a bit more worn out that she was. She denies any sensation of rapid irregular heartbeats that would suggest atrial fibrillation. She was extremely sensitive when she did 1 to fibrillation and therefore probably would know. She has had off-and-on swelling is worse over last month requiring her to take occasional extra doses of furosemide. She also notes a positional dizziness/lightheadedness  She does not note her regular sinus tachycardia and has not felt any palpitations. No syncope or near syncope but has had some dizziness. No TIAs with amaurosis fugax symptoms. She generally feels fatigued and tired no energy that has been cut a progressive thing over the last year or so. She occasionally gets some burning discomfort in her chest up into her throat which is relieved by drinking Pepsi or  taking TUMS. She is on standing O2 by [place] 4L - increases for exertion to ~5-6L  It's been some time since  her last COPD exacerbation  As opposed to her last visit when she was recovering from a COPD flare.  As usual,she denies any chest tightness or pressure with rest or exertion Relatively well-controlled swelling, chronic orthopnea sleeping in a chair. No notable PND as well as a stable ankle edema on her current dose Lasix. She is currently 80 mg twice a day on a sliding scale when necessary regimen.  No claudication. She denies any complaints of melena, hematochezia, hematuria, epistaxis.  Past Medical History - Cardiovascular/Pulmonary  Diagnosis Date  . Hypertension   . Diabetes mellitus type 2 with neurological manifestations     On Insulin  . Morbidly obese   . Hyperlipemia   . COPD (chronic obstructive pulmonary disease)     Requiring Home O2 at 4L  . Atrial tachycardia     Mostly Sinus Tachycardia  . PAF (paroxysmal atrial fibrillation)   . Inappropriate sinus tachycardia   . Community acquired pneumonia - Recent Admission (07/2013) 07/08/2013  . Sleep apnea     no cpap machine  02 at 4l/min all the time    Prior Cardiac Evaluation and Past Surgical History: Procedure Laterality Date  . Tee without cardioversion  03/24/2012    Procedure: TRANSESOPHAGEAL ECHOCARDIOGRAM (TEE);  Surgeon: Pixie Casino, MD;  Location: [facility] OR;  Service: Cardiovascular;  Laterality: N/A;  TEE with Bubble  . Right and Left Cardiac Catheterization  July 2013    RAP: 22 mmHg, and RVP: 46/16 mmHg, RV EDP  18; L. BP 110/11 mmHg, LVEDP 20 mmHg;  PAP 45/29 mmHg (mean 37 mmHg); PCWP 29/22 mmHg - 26 mmHg; no significant coronary disease   Current Outpatient Prescriptions on File Prior to Visit  Medication Sig Dispense Refill  . acetaminophen (TYLENOL) 500 MG tablet Take 1,000 mg by mouth every 6 (six) hours as needed for mild pain.    Marland Kitchen albuterol (PROVENTIL) (2.5 MG/3ML) 0.083% nebulizer solution Take 3 mLs (2.5 mg total) by nebulization every 6 (six) hours as needed for wheezing or shortness of breath. 75 mL  3  . ALPRAZolam (XANAX) 0.5 MG tablet Take 0.5-1 mg by mouth at bedtime as needed for anxiety or sleep.     . AMBULATORY NON FORMULARY MEDICATION Continuous O2 @@ 4LMP    . atorvastatin (LIPITOR) 40 MG tablet Take 1 tablet by mouth every evening.     . benzonatate (TESSALON) 200 MG capsule Take 400 mg by mouth 3 (three) times daily as needed for cough.     . budesonide-formoterol (SYMBICORT) 160-4.5 MCG/ACT inhaler INHALE 2 PUFFS BY MOUTH INTO THE LUNGS TWICE DAILY 10.2 g 5  . buPROPion (WELLBUTRIN XL) 150 MG 24 hr tablet Take 300 mg by mouth at bedtime.     Marland Kitchen CALCIUM-VITAMIN D PO Take 1 tablet by mouth 2 (two) times daily.     Marland Kitchen docusate sodium (COLACE) 100 MG capsule Take 100 mg by mouth 2 (two) times daily.     . DULoxetine (CYMBALTA) 60 MG capsule Take 60 mg by mouth every morning.     . furosemide (LASIX) 40 MG tablet Take 40 mg by mouth 2 (two) times daily.     Marland Kitchen gabapentin (NEURONTIN) 600 MG tablet Take 600 mg by mouth 3 (three) times daily.     Marland Kitchen HYDROcodone-acetaminophen (NORCO/VICODIN) 5-325 MG per tablet Take 1 tablet by mouth every 4 (four) hours as needed for moderate pain.     Marland Kitchen insulin aspart (NOVOLOG) 100 UNIT/ML injection Inject 10 Units into the skin 2 (two) times daily.     . insulin glargine (LANTUS) 100 UNIT/ML injection Inject 70 Units into the skin daily before breakfast. 10 mL   . irbesartan (AVAPRO) 150 MG tablet TAKE 1 TABLET BY MOUTH DAILY 30 tablet 7  . iron polysaccharides (NIFEREX) 150 MG capsule Take 1 capsule (150 mg total) by mouth daily. 30 capsule 6  . levalbuterol (XOPENEX HFA) 45 MCG/ACT inhaler Inhale 1-2 puffs into the lungs every 4 (four) hours as needed for wheezing. 1 Inhaler 12  . Multiple Vitamin (MULTIVITAMIN) tablet Take 1 tablet by mouth every morning.     . Omega-3 Fatty Acids (FISH OIL) 1000 MG CAPS Take 1 capsule by mouth 3 (three) times daily.     Marland Kitchen omeprazole (PRILOSEC) 20 MG capsule Take 20 mg by mouth at bedtime.     . ONE TOUCH ULTRA TEST  test strip 1 each by Other route 3 (three) times daily.   4  . polyethylene glycol (MIRALAX / GLYCOLAX) packet Take 17 g by mouth every morning.     . polyvinyl alcohol (LIQUIFILM TEARS) 1.4 % ophthalmic solution Place 1 drop into both eyes as needed for dry eyes.    . potassium chloride (KLOR-CON) 10 MEQ CR tablet Take 10 mEq by mouth 3 (three) times daily.     . rivaroxaban (XARELTO) 20 MG TABS tablet Take 1 tablet (20 mg total) by mouth daily with supper. Restart on Wednesday May 11 , 2016 30 tablet   . roflumilast (  DALIRESP) 500 MCG TABS tablet Take 500 mcg by mouth every morning.     Marland Kitchen SPIRIVA HANDIHALER 18 MCG inhalation capsule INHALE THE CONTENTS OF 1 CAPSULE BY MOUTH VIA HANDIHALER DAILY 30 capsule 6  . verapamil (VERELAN PM) 360 MG 24 hr capsule TAKE ONE CAPSULE BY MOUTH DAILY 30 capsule 7   No current facility-administered medications on file prior to visit.   Allergies  Allergen Reactions  . Aspirin Shortness Of Breath  . Nsaids Shortness Of Breath   Social History  Substance Use Topics  . Smoking status: Former Smoker -- 2.00 packs/day for 30 years    Types: Cigarettes    Quit date: 06/02/1994  . Smokeless tobacco: Never Used  . Alcohol Use: No   Family History  Problem Relation Age of Onset  . Heart disease Mother   . Stroke Mother   . Heart disease Father   . Heart disease Brother   . Stroke Brother   . Skin cancer Brother   . Colon cancer Neg Hx   . Colon polyps Sister   . Diabetes Sister   . Diabetes Brother   . Irritable bowel syndrome Daughter     ROS: A comprehensive Review of Systems - was performed Review of Systems  Constitutional: Positive for malaise/fatigue.  HENT: Negative for nosebleeds.   Respiratory: Positive for cough (morning cough), shortness of breath (Stable, if not improved from baseline. Able to ride a stationary bike for 15 minutes.) and wheezing.        On home O2. 4 L  Cardiovascular: Positive for leg swelling (Mild). Negative for  chest pain, palpitations, orthopnea and PND.       Per history of present illness  Gastrointestinal: Negative for blood in stool and melena.  Genitourinary: Negative for hematuria.  Musculoskeletal: Positive for joint pain.  Neurological: Negative for dizziness, weakness and headaches.  Endo/Heme/Allergies: Bruises/bleeds easily.  Psychiatric/Behavioral: Positive for depression (I think her condition is starting to get to her overall she seems to be little more depressed she was before.).  All other systems reviewed and are negative.  Wt Readings from Last 3 Encounters:  01/29/15 223 lb (101.152 kg)  12/28/14 223 lb 3.2 oz (101.243 kg)  10/10/14 225 lb (102.059 kg)   PHYSICAL EXAM BP 120/60 mmHg  Pulse 107  Ht 5\' 5"  (1.651 m)  Wt 223 lb (101.152 kg)  BMI 37.11 kg/m2 General appearance: A&O x3; chronically ill-appearing obese woman who is in no acute distress. Wearing chronic home O2 from her concentrator @ 4 Lpm. No increased work of breathing.  Neck: no carotid bruit, no JVD and supple, symmetrical, trachea midline  Lungs: Nonlabored,  But with accessory muscle use.  No true wheezes but there is scattered rhonchorous expirations bilaterally. Occasional coughing  Heart: Mildly tachycardic; Regular rhythm. Normal S1-S2, no M./R./G. Unable to palpate PMI due to body habitus.  Abdomen: soft, NT/ND/NABS, Unable to palpate HSM due to body habitus. No HJR  Extremities: edema Trace, no ulcers, gangrene or trophic changes and Mild spider veins, no significant varicosities  Pulses: 2+ and symmetric  Skin: Skin color, texture, turgor normal. No rashes or lesions  Neurologic: Grossly normal; pleasant but somewhat depressed and dejected mood.   Adult ECG Report  Rate: 107;  Rhythm: sinus tachycardia otherwise essentially normal EKG normal axis, intervals, duration and voltage.   Narrative Interpretation:  stable EKG   Recent Labs  none checked since May.  ASSESSMENT / PLAN: Problem List  Items Addressed This Visit  Chronic diastolic heart failure of unknown etiology (Chronic)    She is on ARB for afterload reduction and high dose calcium channel blocker for rate control. On a stable twice a day dose of Lasix with when necessary dosing for additional edema or worsening orthopnea/PND.      Relevant Medications   digoxin (LANOXIN) 0.125 MG tablet   Essential hypertension - Primary (Chronic)    Well controlled on current regimen. No change      Relevant Medications   digoxin (LANOXIN) 0.125 MG tablet   Other Relevant Orders   EKG 12-Lead (Completed)   Digoxin level   Comprehensive metabolic panel   Inappropriate sinus tachycardia (Chronic)    Her heart ratecontinues to be over 100 of time I see her. Often this is because his V. Tach after walking in.  I would like to reduce her resting heart rate some. Plan will be to initiate treatment with digoxin starting with a two-day load and then going to baseline dose.  This should also help to decrease ventricular response to A. Fib.  She will need a digoxin level and chemistry panel checked shortly after starting.      Relevant Medications   digoxin (LANOXIN) 0.125 MG tablet   Other Relevant Orders   EKG 12-Lead (Completed)   Digoxin level   Comprehensive metabolic panel   Obesity, Class II, BMI 35-39.9, with comorbidity (Chronic)    Discussed dietary medication. Recommend continued attempts at 6 minute walk. Unfortunately her breathing issues has limited her exercise as has her weight. It seems to be a vicious cycle. Her weight is currently stable however.      Relevant Orders   EKG 12-Lead (Completed)   PAF (paroxysmal atrial fibrillation) (Chronic)    Thankfully, no recurrent episodes. She is on high dose calcium blocker for rate control and on Xarelto for anticoagulation. If she were to have recurrences we would need to strongly consider antiarrhythmic agents to try to keep her rhythm controlled.      Relevant  Medications   digoxin (LANOXIN) 0.125 MG tablet   Other Relevant Orders   EKG 12-Lead (Completed)   Digoxin level   Comprehensive metabolic panel   Pulmonary hypertension (Chronic)    The patient has maybe a mild intracardiac shunt as indicated by a bubble study, but there was no significant step-off and O2 saturation on right heart catheterization. She simply has elevated left-sided filling pressures and moderate pulmonary hypertension. Wedge pressure was 22 with a mean PA pressure of 34 indicating that there is clearly some intrapulmonary component as well.  We are treating her heart rate and had increased her diuresis. Overall this has made her feel somewhat better. I think trying her heart rate down would be more helpful. She is on calcium channel blocker. I don't see pulmonary he is interested in starting her on any home hypertension medications.  She is in the process of switching from one pulmonologist to another. Continue to work on walking. Wearing home oxygen which is the best treatment.      Relevant Medications   digoxin (LANOXIN) 0.125 MG tablet    Other Visit Diagnoses    Other fatigue        Relevant Orders    EKG 12-Lead (Completed)    Digoxin level    Comprehensive metabolic panel      PATIENT INSTRUCTIONS: START DIGOXIN 0.125 MG  THE FIRST DAY TAKE 4 TABLETS  AT ONCE,THEN TAKE 2 TABLET  A DAY FOR 2 DAYS,THEN  GO TO 1 TABLET A DAY.  IN 2 WEEKS HAVE LAB WORK - (WEEK OF SEPT 12)  CMP , DIGOXIN LEVEL (DO NOT TAKE DIGOXIN THE MORNING OF TEST UNTIL LABS ARE DONE)  Your physician wants you to follow-up in 4 MONTHS WITH DR HARDING.-30 MIN.   Orders Placed This Encounter  Procedures  . Digoxin level  . Comprehensive metabolic panel  . EKG 12-Lead   Meds ordered this encounter  Medications  . digoxin (LANOXIN) 0.125 MG tablet    Sig: Take 1 tablet (0.125 mg total) by mouth daily.    Dispense:  30 tablet    Refill:  42    DAVID W. Ellyn Hack, M.D.,  M.S. Interventional Cardiologist [facility]

## 2015-01-29 NOTE — Patient Instructions (Signed)
START DIGOXIN 0.125 MG  THE FIRST DAY TAKE 4 TABLETS  AT ONCE,THEN TAKE 2 TABLET  A DAY FOR 2 DAYS,THEN GO TO 1 TABLET A DAY.  IN 2 WEEKS HAVE LAB WORK - (WEEK OF SEPT 12)  CMP , DIGOXIN LEVEL (DO NOT TAKE DIGOXIN THE MORNING OF TEST UNTIL LABS ARE DONE)  Your physician wants you to follow-up in 4 MONTHS WITH DR HARDING.-30 MIN. You will receive a reminder letter in the mail two months in advance. If you don't receive a letter, please call our office to schedule the follow-up appointment.

## 2015-01-31 ENCOUNTER — Encounter: Payer: Self-pay | Admitting: Cardiology

## 2015-01-31 NOTE — Assessment & Plan Note (Addendum)
She is on ARB for afterload reduction and high dose calcium channel blocker for rate control. On a stable twice a day dose of Lasix with when necessary dosing for additional edema or worsening orthopnea/PND.

## 2015-01-31 NOTE — Assessment & Plan Note (Signed)
Well-controlled on current regimen.  No change 

## 2015-01-31 NOTE — Assessment & Plan Note (Signed)
The patient has maybe a mild intracardiac shunt as indicated by a bubble study, but there was no significant step-off and O2 saturation on right heart catheterization. She simply has elevated left-sided filling pressures and moderate pulmonary hypertension. Wedge pressure was 22 with a mean PA pressure of 34 indicating that there is clearly some intrapulmonary component as well.  We are treating her heart rate and had increased her diuresis. Overall this has made her feel somewhat better. I think trying her heart rate down would be more helpful. She is on calcium channel blocker. I don't see pulmonary he is interested in starting her on any home hypertension medications.  She is in the process of switching from one pulmonologist to another. Continue to work on walking. Wearing home oxygen which is the best treatment.

## 2015-01-31 NOTE — Assessment & Plan Note (Signed)
Thankfully, no recurrent episodes. She is on high dose calcium blocker for rate control and on Xarelto for anticoagulation. If she were to have recurrences we would need to strongly consider antiarrhythmic agents to try to keep her rhythm controlled.

## 2015-01-31 NOTE — Assessment & Plan Note (Signed)
Discussed dietary medication. Recommend continued attempts at 6 minute walk. Unfortunately her breathing issues has limited her exercise as has her weight. It seems to be a vicious cycle. Her weight is currently stable however.

## 2015-01-31 NOTE — Assessment & Plan Note (Addendum)
Her heart ratecontinues to be over 100 of time I see her. Often this is because his V. Tach after walking in.  I would like to reduce her resting heart rate some. Plan will be to initiate treatment with digoxin starting with a two-day load and then going to baseline dose.  This should also help to decrease ventricular response to A. Fib.  She will need a digoxin level and chemistry panel checked shortly after starting.

## 2015-02-01 ENCOUNTER — Ambulatory Visit (HOSPITAL_COMMUNITY)
Admission: RE | Admit: 2015-02-01 | Discharge: 2015-02-01 | Disposition: A | Discharge status: Home / Self Care | Payer: Medicare Other | Source: Ambulatory Visit | Attending: Internal Medicine | Admitting: Internal Medicine

## 2015-02-01 DIAGNOSIS — M810 Age-related osteoporosis without current pathological fracture: Secondary | ICD-10-CM | POA: Insufficient documentation

## 2015-02-01 MED ORDER — SODIUM CHLORIDE 0.9 % IV SOLN: INTRAVENOUS | Status: DC

## 2015-02-01 MED ORDER — ZOLEDRONIC ACID 5 MG/100ML IV SOLN
5.0000 mg | Freq: Once | INTRAVENOUS | Status: DC
  Filled 2015-02-01: qty 100

## 2015-02-01 NOTE — Progress Notes (Signed)
Patient presents to Medical Short Stay for reclast infusion. She last received reclast 02/01/2014. Unable to infuse today as it has not been 365 days since last infusion (insurance reimbursement). Rescheduled to 02/06/15 at 3PM.

## 2015-02-06 ENCOUNTER — Encounter (HOSPITAL_COMMUNITY): Payer: Self-pay

## 2015-02-06 ENCOUNTER — Ambulatory Visit (HOSPITAL_COMMUNITY)
Admission: RE | Admit: 2015-02-06 | Discharge: 2015-02-06 | Disposition: A | Discharge status: Home / Self Care | Payer: Medicare Other | Source: Ambulatory Visit | Attending: Internal Medicine | Admitting: Internal Medicine

## 2015-02-06 DIAGNOSIS — M810 Age-related osteoporosis without current pathological fracture: Secondary | ICD-10-CM | POA: Insufficient documentation

## 2015-02-06 MED ORDER — SODIUM CHLORIDE 0.9 % IV SOLN
INTRAVENOUS | Status: AC
  Administered 2015-02-06: 15:00:00 via INTRAVENOUS

## 2015-02-06 MED ORDER — ZOLEDRONIC ACID 5 MG/100ML IV SOLN
5.0000 mg | Freq: Once | INTRAVENOUS | Status: AC
  Administered 2015-02-06: 5 mg via INTRAVENOUS
  Filled 2015-02-06: qty 100

## 2015-02-06 NOTE — Discharge Instructions (Signed)

## 2015-02-08 ENCOUNTER — Encounter: Payer: Self-pay | Admitting: Internal Medicine

## 2015-02-08 ENCOUNTER — Ambulatory Visit (INDEPENDENT_AMBULATORY_CARE_PROVIDER_SITE_OTHER): Payer: Medicare Other | Admitting: Internal Medicine

## 2015-02-08 VITALS — BP 126/62 | HR 100 | Ht 65.0 in | Wt 223.0 lb

## 2015-02-08 DIAGNOSIS — J9611 Chronic respiratory failure with hypoxia: Secondary | ICD-10-CM

## 2015-02-08 DIAGNOSIS — J449 Chronic obstructive pulmonary disease, unspecified: Secondary | ICD-10-CM

## 2015-02-08 DIAGNOSIS — IMO0001 Reserved for inherently not codable concepts without codable children: Secondary | ICD-10-CM

## 2015-02-08 NOTE — Assessment & Plan Note (Signed)
Body mass index is 37.11 kg/(m^2).  Lab Results  Component Value Date   TSH 0.246* 05/26/2012     Contributing to gerd tendency/ doe/reviewed need  achieve and maintain neg calorie balance > defer f/u primary care including intermittently monitoring thyroid status

## 2015-02-08 NOTE — Patient Instructions (Signed)
Before shopping, try 2 pffs of xopenex and use 6lpm   Prilosec should be taken 30-60 min before supper and add pepcid 20 mg at bedtime if continue to cough at bedtime  GERD (REFLUX)  is an extremely common cause of respiratory symptoms just like yours , many times with no obvious heartburn at all.    It can be treated with medication, but also with lifestyle changes including elevation of the head of your bed (ideally with 6 inch  bed blocks),  Smoking cessation, avoidance of late meals, excessive alcohol, and avoid fatty foods, chocolate, peppermint, colas, red wine, and acidic juices such as orange juice.  NO MINT OR MENTHOL PRODUCTS SO NO COUGH DROPS  USE SUGARLESS CANDY INSTEAD (Jolley ranchers or Stover's or Life Savers) or even ice chips will also do - the key is to swallow to prevent all throat clearing. NO OIL BASED VITAMINS - use powdered substitutes.    Please schedule a follow up office visit in 6 weeks, call sooner if needed with pfts

## 2015-02-08 NOTE — Progress Notes (Signed)
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 + response to daliresp per pt Referred to pulmonary rehab. > poor tolerance maint on 4lpm 24/7 but up to 5lpm walking     History of Present Illness  12/28/2014 not prev seen by [facility]  re:  GOLD II copd/  chronic resp failure on 4lpm sitting/ 5 lpm walking  Chief Complaint  Patient presents with  . Follow-up    Former pt of Dr Gwenette Greet.  She c/o increased DOE over the past 2 months. She gets winded walking around her home.  She uses xopenex inhaler 2 x daily on average and uses neb with albuterol every am.   confused between maint and prns and loosing ground on "daily" saba x 2 m rec Plan A = Automatic = Symbicort 160 Take 2 puffs first thing in am and then another 2 puffs about 12 hours later.                                      Spiriva Plan B= Backup  Only use your levoalbuterol (xopenex) as a rescue medication    Plan C = Crisis - only do this if you try plan B 1st and it doesn't work / albuterol neb  2.5 mg up to every 4 hours if you must  Plan D = Doctor call if plan C doesn't work Plan E = Emergency room , go there if all else fails Walk on 6lpm/ do 4lpm otherwise.    02/08/2015 f/u ov/Destiny Shelton re:  Morbid obesity/ GOLD II copd/ 02 dep resp failure  Chief Complaint  Patient presents with  . Follow-up    Pt states her breathing has improved "off and on".  She is using xopenex inhaler about 2 x per day.        Would like to be able to walk better at [employer] but never tried xopenex first and confused with 02 issues /  Sleeps in recliner due to back and breathing around once a week wakes up coughing    No obvious day to day or daytime  variability or assoc excess/ purulent mucus cp or chest tightness, subjective wheeze or overt sinus or hb symptoms. No unusual exp hx or h/o childhood pna/ asthma or knowledge of premature birth.  Sleeping ok with HOB 30 degrees  without nocturnal  or early am exacerbation  of respiratory  c/o's or need for noct saba. Also denies any obvious fluctuation of symptoms with weather or environmental changes or other aggravating or alleviating factors except as outlined above   Current Medications, Allergies, Complete Past Medical History, Past Surgical History, Family History, and Social History were reviewed in [employer] record.  ROS  The following are not active complaints unless bolded sore throat, dysphagia, dental problems, itching, sneezing,  nasal congestion or excess/ purulent secretions, ear ache,   fever, chills, sweats, unintended wt loss, classically pleuritic or exertional cp, hemoptysis,  orthopnea pnd or leg swelling, presyncope, palpitations, abdominal pain, anorexia, nausea, vomiting, diarrhea  or change in bowel  or bladder habits, change in stools or urine, dysuria,hematuria,  rash, arthralgias, visual complaints, headache, numbness, weakness or ataxia or problems with walking or coordination,  change in mood/affect or memory.               Objective:   Physical Exam     02/08/2015         223  Wt Readings from Last 3 Encounters:  12/28/14 223 lb 3.2 oz (101.243 kg)  10/10/14 225 lb (102.059 kg)  08/21/14 225 lb 8 oz (102.286 kg)    Vital signs reviewed   HEENT: nl dentition, turbinates, and orophanx. Nl external ear canals without cough reflex   NECK :  without JVD/Nodes/TM/ nl carotid upstrokes bilaterally   LUNGS: no acc muscle use, clear to A and P bilaterally without cough on insp or exp maneuvers   CV:  RRR  no s3 or murmur or increase in P2, no edema   ABD:  soft and nontender with nl excursion in the supine position. 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 No new opacity. No change in cardiac silhouette.    Assessment:

## 2015-02-11 NOTE — Assessment & Plan Note (Signed)
pfts 2011:  FEV1 1.34 (55%), ratio 68, ++airtrapping on lung volumes, nl TLC, DLCO 40% pred. + response to daliresp per pt Referred to pulmonary rehab. > poor tolerance - 12/28/2014 p extensive coaching HFA effectiveness =    75% so needs work using hfa or change to dpi - 02/08/2015 p extensive coaching HFA effectiveness =    90%   Last spirometry suggests restriction >> obst and she is morbidly obese so needs fresh set to sort out/ consider lama trial if air trapping still present  I had an extended discussion with the patient reviewing all relevant studies completed to date and  lasting 15 to 20 minutes of a 25 minute visit    Each maintenance medication was reviewed in detail including most importantly the difference between maintenance and prns and under what circumstances the prns are to be triggered using an action plan format that is not reflected in the computer generated alphabetically organized AVS.    Please see instructions for details which were reviewed in writing and the patient given a copy highlighting the part that I personally wrote and discussed at today's ov.

## 2015-02-11 NOTE — Assessment & Plan Note (Addendum)
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  12/28/2014   Walked 5lpm x one lap @ 185 stopped due to  desat to 86% > rec 6lpm walking/ o/w 4lpm  - 02/08/2015   Walked RA x one lap @ 185 stopped due to  Sob/ slow pace/ desat to 85  rec 4lpm 24/7 but 6lpm with walking

## 2015-02-12 DIAGNOSIS — I498 Other specified cardiac arrhythmias: Secondary | ICD-10-CM | POA: Diagnosis not present

## 2015-02-12 DIAGNOSIS — I4891 Unspecified atrial fibrillation: Secondary | ICD-10-CM | POA: Diagnosis not present

## 2015-02-12 DIAGNOSIS — I10 Essential (primary) hypertension: Secondary | ICD-10-CM | POA: Diagnosis not present

## 2015-02-12 DIAGNOSIS — I480 Paroxysmal atrial fibrillation: Secondary | ICD-10-CM | POA: Diagnosis not present

## 2015-02-12 DIAGNOSIS — I471 Supraventricular tachycardia: Secondary | ICD-10-CM | POA: Diagnosis not present

## 2015-02-12 DIAGNOSIS — R5383 Other fatigue: Secondary | ICD-10-CM | POA: Diagnosis not present

## 2015-02-13 LAB — COMPREHENSIVE METABOLIC PANEL
ALBUMIN: 3.8 g/dL (ref 3.6–5.1)
ALT: 12 U/L (ref 6–29)
AST: 15 U/L (ref 10–35)
Alkaline Phosphatase: 66 U/L (ref 33–130)
BILIRUBIN TOTAL: 0.4 mg/dL (ref 0.2–1.2)
BUN: 11 mg/dL (ref 7–25)
CHLORIDE: 103 mmol/L (ref 98–110)
CO2: 30 mmol/L (ref 20–31)
CREATININE: 0.85 mg/dL (ref 0.60–0.93)
Calcium: 8.7 mg/dL (ref 8.6–10.4)
Glucose, Bld: 128 mg/dL — ABNORMAL HIGH (ref 65–99)
Potassium: 4.3 mmol/L (ref 3.5–5.3)
Sodium: 140 mmol/L (ref 135–146)
TOTAL PROTEIN: 6.7 g/dL (ref 6.1–8.1)

## 2015-02-13 LAB — DIGOXIN LEVEL: Digoxin Level: 0.6 ug/L — ABNORMAL LOW (ref 0.8–2.0)

## 2015-02-15 ENCOUNTER — Telehealth: Payer: Self-pay | Admitting: *Deleted

## 2015-02-15 NOTE — Telephone Encounter (Signed)
No answer/will try again.

## 2015-02-15 NOTE — Telephone Encounter (Signed)
-----   Message from Leonie Man, MD sent at 02/14/2015 10:54 AM EDT ----- Labs look good. Digoxin level is just below reference range which is more out of prefer for her to be. It is crucial to follow potassium and magnesium levels occasionally while on digoxin.  Leonie Man, MD   please forward to Dr. Gwyndolyn Saxon Marden [facility]) Brigitte Pulse

## 2015-02-15 NOTE — Telephone Encounter (Signed)
Spoke to patient. Result given . Verbalized understanding  

## 2015-02-24 DIAGNOSIS — Z23 Encounter for immunization: Secondary | ICD-10-CM | POA: Diagnosis not present

## 2015-03-23 ENCOUNTER — Ambulatory Visit (INDEPENDENT_AMBULATORY_CARE_PROVIDER_SITE_OTHER): Payer: Medicare Other | Admitting: Internal Medicine

## 2015-03-23 ENCOUNTER — Encounter: Payer: Self-pay | Admitting: Internal Medicine

## 2015-03-23 VITALS — BP 112/66 | HR 104 | Ht 65.0 in | Wt 219.0 lb

## 2015-03-23 DIAGNOSIS — J449 Chronic obstructive pulmonary disease, unspecified: Secondary | ICD-10-CM

## 2015-03-23 DIAGNOSIS — J9611 Chronic respiratory failure with hypoxia: Secondary | ICD-10-CM | POA: Diagnosis not present

## 2015-03-23 DIAGNOSIS — IMO0001 Reserved for inherently not codable concepts without codable children: Secondary | ICD-10-CM

## 2015-03-23 LAB — PULMONARY FUNCTION TEST
FEF 25-75 Post: 0.92 L/sec
FEF 25-75 Pre: 0.95 L/sec
FEF2575-%Change-Post: -2 %
FEF2575-%PRED-PRE: 48 %
FEF2575-%Pred-Post: 47 %
FEV1-%Change-Post: 0 %
FEV1-%Pred-Post: 68 %
FEV1-%Pred-Pre: 68 %
FEV1-PRE: 1.61 L
FEV1-Post: 1.61 L
FEV1FVC-%Change-Post: 3 %
FEV1FVC-%Pred-Pre: 87 %
FEV6-%Change-Post: -2 %
FEV6-%PRED-PRE: 80 %
FEV6-%Pred-Post: 78 %
FEV6-Post: 2.33 L
FEV6-Pre: 2.4 L
FEV6FVC-%CHANGE-POST: 0 %
FEV6FVC-%PRED-POST: 104 %
FEV6FVC-%Pred-Pre: 104 %
FVC-%CHANGE-POST: -3 %
FVC-%PRED-POST: 75 %
FVC-%Pred-Pre: 77 %
FVC-Post: 2.34 L
FVC-Pre: 2.42 L
POST FEV6/FVC RATIO: 100 %
Post FEV1/FVC ratio: 69 %
Pre FEV1/FVC ratio: 67 %
Pre FEV6/FVC Ratio: 99 %
RV % pred: 111 %
RV: 2.51 L
TLC % PRED: 89 %
TLC: 4.67 L

## 2015-03-23 MED ORDER — TIOTROPIUM BROMIDE-OLODATEROL 2.5-2.5 MCG/ACT IN AERS: 2.0000 | INHALATION_SPRAY | Freq: Every day | RESPIRATORY_TRACT | Status: DC

## 2015-03-23 NOTE — Patient Instructions (Addendum)
Before walking, try 2 pffs of xopenex and use 6lpm   Prilosec should be taken 30-60 min before supper and add pepcid 20 mg at bedtime  And if still coughing add chlorpheniramine 4 mg 1-2 at bedtime   02 is 4lpm 24/7 except turn to 6lpm  when you walk  stiolto 2 puffs each am and stop spiriva and symbicort - if not on your formulary we can just use spiriva in the same device - just let us know if needed   See Tammy NP in 6 weeks with all your medications, even over the counter meds, separated in two separate bags, the ones you take no matter what vs the ones you stop once you feel better and take only as needed when you feel you need them.   Tammy  will generate for you a new user friendly medication calendar that will put [place] all on the same page re: your medication use.     Without this process, it simply isn't possible to assure that we are providing  your outpatient care  with  the attention to detail we feel you deserve.   If we cannot assure that you're getting that kind of care,  then we cannot manage your problem effectively from this clinic.  Once you have seen Tammy and we are sure that we're all on the same page with your medication use she will arrange follow up with me.

## 2015-03-23 NOTE — Progress Notes (Signed)
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 + response to daliresp per pt Referred to pulmonary rehab. > poor tolerance maint on 4lpm 24/7 but up to 5lpm walking     History of Present Illness  12/28/2014 not prev seen by [facility]  re:  GOLD II copd/  chronic resp failure on 4lpm sitting/ 5 lpm walking  Chief Complaint  Patient presents with  . Follow-up    Former pt of Dr Gwenette Greet.  She c/o increased DOE over the past 2 months. She gets winded walking around her home.  She uses xopenex inhaler 2 x daily on average and uses neb with albuterol every am.   confused between maint and prns and loosing ground on "daily" saba x 2 m rec Plan A = Automatic = Symbicort 160 Take 2 puffs first thing in am and then another 2 puffs about 12 hours later.                                      Spiriva Plan B= Backup  Only use your levoalbuterol (xopenex) as a rescue medication    Plan C = Crisis - only do this if you try plan B 1st and it doesn't work / albuterol neb  2.5 mg up to every 4 hours if you must  Plan D = Doctor call if plan C doesn't work Plan E = Emergency room , go there if all else fails Walk on 6lpm/ do 4lpm otherwise.    02/08/2015 f/u ov/Yvonne Petite re:  Morbid obesity/ GOLD II copd/ 02 dep resp failure  Chief Complaint  Patient presents with  . Follow-up    Pt states her breathing has improved "off and on".  She is using xopenex inhaler about 2 x per day.     Would like to be able to walk better at [place] but never tried xopenex first and confused with 02 issues /  Sleeps in recliner due to back and breathing around once a week wakes up coughing (sleeps in recliner due to breathing back knee)   rec Before shopping, try 2 pffs of xopenex and use 6lpm  Prilosec should be taken 30-60 min before supper and add pepcid 20 mg at bedtime if continue to cough at bedtime GERD diet    03/23/2015  f/u ov/Amori Cooperman re: GOLD II COPD / vsd  Chief Complaint  Patient presents with  . Follow-up    Pt states that her breathing is unchanged. She c/o increased cough- esp at night, prod with yellow to green sputum.  She is using xopenex for rescue 1-2 x per wk.    Can't even walk room to room  On 4lpm (note was instructed to use 6lpm walking)  Cough bothers her most when trying to sleep and in early am     No obvious day to day or daytime variability or assoc  p or chest tightness, subjective wheeze or overt sinus or hb symptoms. No unusual exp hx or h/o childhood pna/ asthma or knowledge of premature birth.  Sleeping with  HOB 30 degrees  without   need for noct saba. Also denies any obvious fluctuation of symptoms with weather or environmental changes or other aggravating or alleviating factors except as outlined above   Current Medications, Allergies, Complete Past Medical History, Past Surgical History, Family History, and Social History were reviewed in [employer] record.  ROS  The following are not active complaints unless bolded sore throat, dysphagia, dental problems, itching, sneezing,  nasal congestion or excess/ purulent secretions, ear ache,   fever, chills, sweats, unintended wt loss, classically pleuritic or exertional cp, hemoptysis,  orthopnea pnd or leg swelling, presyncope, palpitations, abdominal pain, anorexia, nausea, vomiting, diarrhea  or change in bowel or bladder habits, change in stools or urine, dysuria,hematuria,  rash, arthralgias, visual complaints, headache, numbness, weakness or ataxia or problems with walking or coordination,  change in mood/affect or memory.               Objective:   Physical Exam     02/08/2015         223 > 03/23/2015   219   Wt Readings from Last 3 Encounters:  12/28/14 223 lb 3.2 oz (101.243 kg)  10/10/14 225 lb (102.059 kg)  08/21/14 225 lb 8 oz (102.286 kg)    Vital signs reviewed   HEENT: nl dentition, turbinates, and orophanx. Nl external ear canals without cough reflex   NECK :  without JVD/Nodes/TM/ nl carotid upstrokes bilaterally   LUNGS: no acc muscle use, clear to A and P bilaterally without cough on insp or exp maneuvers   CV:  RRR  no s3 or murmur or increase in P2, no edema   ABD:  soft and nontender with nl excursion in the supine position. 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 No new opacity. No change in cardiac silhouette.    Assessment:

## 2015-03-23 NOTE — Progress Notes (Signed)
PFT performed today. DLCO could not be performed due to technical difficulties with the PFT machine. MW was made aware of this prior to testing.

## 2015-03-24 NOTE — Assessment & Plan Note (Signed)
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  12/28/2014   Walked 5lpm x one lap @ 185 stopped due to  desat to 86% > rec 6lpm walking/ o/w 4lpm  - 02/08/2015   Walked 6lpm  x one lap @ 185 stopped due to  Sob/ slow pace/ desat to 85 - 03/23/2015   Walked 6lpm x one lap @ 185 stopped due to  desat to 80%   Will notify cardiology that her shunt appears to be worsening with exercise as her airflow actually is quite good. For now though there is no reason why she should not always increase the oxygen to 6 L whenever she is walking

## 2015-03-24 NOTE — Assessment & Plan Note (Signed)
PFTs 03/23/2015 with ERV 1   Body mass index is 36.44   Lab Results  Component Value Date   TSH 0.246* 05/26/2012     Contributing to gerd tendency/ doe/reviewed the need and the process to achieve and maintain neg calorie balance > defer f/u primary care including intermittently monitoring thyroid status

## 2015-03-24 NOTE — Assessment & Plan Note (Addendum)
pfts 2011:  FEV1 1.34 (55%), ratio 68, ++airtrapping on lung volumes, nl TLC, DLCO 40% pred. + response to daliresp per pt Referred to pulmonary rehab. > poor tolerance - 12/28/2014 p extensive coaching HFA effectiveness =    75% so needs work using hfa or change to dpi - 02/08/2015 p extensive coaching HFA effectiveness =    90%  - PFT's  03/23/2015  FEV1 1.61 (68 % ) ratio 69  p no % improvement from saba on   Symb/spiriva  Note that her PFTs have improved quite a bit over the last 5 years and yet her activity tolerance has declined probably related to obesity and possibly also the chronic ex hypoxemia from right to left shunting. At this point though she really doesn't need 3 different medications to treat her copd so recommend a single inhaler , stiolto,  the combination of a lama/laba and if she does fine on this consider evening switching her just to the lama to simplify her care.  The proper method of use, as well as anticipated side effects, of a metered-dose /respimat inhaler are discussed and demonstrated to the patient. Improved effectiveness after extensive coaching during this visit to a level of approximately  90% so she should do fine with this if covered by her insurance.  I had an extended discussion with the patient reviewing all relevant studies completed to date and  lasting 15 to 20 minutes of a 25 minute visit    Each maintenance medication was reviewed in detail including most importantly the difference between maintenance and prns and under what circumstances the prns are to be triggered using an action plan format that is not reflected in the computer generated alphabetically organized AVS.    Please see instructions for details which were reviewed in writing and the patient given a copy highlighting the part that I personally wrote and discussed at today's ov.

## 2015-03-28 ENCOUNTER — Other Ambulatory Visit: Payer: Self-pay | Admitting: *Deleted

## 2015-03-28 MED ORDER — IRBESARTAN 150 MG PO TABS
150.0000 mg | ORAL_TABLET | Freq: Every day | ORAL | Status: DC
Start: 2015-03-28 — End: 2015-07-23

## 2015-04-02 ENCOUNTER — Telehealth: Payer: Self-pay | Admitting: Internal Medicine

## 2015-04-02 NOTE — Telephone Encounter (Signed)
mucinex dm up to 1200 mg every 12 hours and supplement prn with norco (on her list ) up to every 4 h   If needs more call in 40

## 2015-04-02 NOTE — Telephone Encounter (Signed)
Patient calling to get something called in for her cough.  Coughing up yellow phlegm, chest tightness, no fever, some SOB when talking, no wheezing.  Pharmacy: [facility] [place] [address]  Allergies  Allergen Reactions  . Aspirin Shortness Of Breath  . Nsaids Shortness Of Breath

## 2015-04-02 NOTE — Telephone Encounter (Signed)
Called and spoke to pt. Informed her of the recs per MW. Pt verbalized understanding and denied any further questions or concerns at this time.   

## 2015-04-16 DIAGNOSIS — Z6834 Body mass index (BMI) 34.0-34.9, adult: Secondary | ICD-10-CM | POA: Diagnosis not present

## 2015-04-16 DIAGNOSIS — Z7901 Long term (current) use of anticoagulants: Secondary | ICD-10-CM | POA: Diagnosis not present

## 2015-04-16 DIAGNOSIS — E1149 Type 2 diabetes mellitus with other diabetic neurological complication: Secondary | ICD-10-CM | POA: Diagnosis not present

## 2015-04-16 DIAGNOSIS — E785 Hyperlipidemia, unspecified: Secondary | ICD-10-CM | POA: Diagnosis not present

## 2015-04-16 DIAGNOSIS — I4891 Unspecified atrial fibrillation: Secondary | ICD-10-CM | POA: Diagnosis not present

## 2015-04-16 DIAGNOSIS — I10 Essential (primary) hypertension: Secondary | ICD-10-CM | POA: Diagnosis not present

## 2015-04-16 DIAGNOSIS — J449 Chronic obstructive pulmonary disease, unspecified: Secondary | ICD-10-CM | POA: Diagnosis not present

## 2015-04-16 DIAGNOSIS — I509 Heart failure, unspecified: Secondary | ICD-10-CM | POA: Diagnosis not present

## 2015-04-16 DIAGNOSIS — Z9981 Dependence on supplemental oxygen: Secondary | ICD-10-CM | POA: Diagnosis not present

## 2015-04-16 DIAGNOSIS — I272 Other secondary pulmonary hypertension: Secondary | ICD-10-CM | POA: Diagnosis not present

## 2015-05-02 ENCOUNTER — Other Ambulatory Visit: Payer: Self-pay | Admitting: Cardiology

## 2015-05-02 NOTE — Telephone Encounter (Signed)
REFILL 

## 2015-05-08 ENCOUNTER — Ambulatory Visit (INDEPENDENT_AMBULATORY_CARE_PROVIDER_SITE_OTHER): Payer: Medicare Other | Admitting: Adult Health

## 2015-05-08 ENCOUNTER — Telehealth: Payer: Self-pay | Admitting: *Deleted

## 2015-05-08 ENCOUNTER — Encounter: Payer: Self-pay | Admitting: Adult Health

## 2015-05-08 VITALS — BP 132/64 | HR 115 | Temp 98.0°F | Ht 65.0 in | Wt 217.0 lb

## 2015-05-08 DIAGNOSIS — J9611 Chronic respiratory failure with hypoxia: Secondary | ICD-10-CM

## 2015-05-08 DIAGNOSIS — J449 Chronic obstructive pulmonary disease, unspecified: Secondary | ICD-10-CM | POA: Diagnosis not present

## 2015-05-08 DIAGNOSIS — I5032 Chronic diastolic (congestive) heart failure: Secondary | ICD-10-CM

## 2015-05-08 MED ORDER — AMOXICILLIN-POT CLAVULANATE 875-125 MG PO TABS: 1.0000 | ORAL_TABLET | Freq: Two times a day (BID) | ORAL | Status: DC

## 2015-05-08 NOTE — Assessment & Plan Note (Signed)
Flare  Patient's medications were reviewed today and patient education was given. Computerized medication calendar was adjusted/completed   Plan  Augmentin 875mg  Twice daily  For 7 days with food.  Mucinex DM Twice daily  As needed  Cough/congestion  Follow med calendar closely and bring to each visit.  Follow up  In 3 months and As needed   Please contact office for sooner follow up if symptoms do not improve or worsen or seek emergency care

## 2015-05-08 NOTE — Telephone Encounter (Signed)
Fine with me

## 2015-05-08 NOTE — Assessment & Plan Note (Signed)
Cont on current regimen , appears compensated without overt vol overload.

## 2015-05-08 NOTE — Progress Notes (Signed)
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 + response to daliresp per pt Referred to pulmonary rehab. > poor tolerance maint on 4lpm 24/7 but up to 5lpm walking     History of Present Illness  12/28/2014 not prev seen by [facility]  re:  GOLD II copd/  chronic resp failure on 4lpm sitting/ 5 lpm walking  Chief Complaint  Patient presents with  . Follow-up    Former pt of Dr Gwenette Greet.  She c/o increased DOE over the past 2 months. She gets winded walking around her home.  She uses xopenex inhaler 2 x daily on average and uses neb with albuterol every am.   confused between maint and prns and loosing ground on "daily" saba x 2 m rec Plan A = Automatic = Symbicort 160 Take 2 puffs first thing in am and then another 2 puffs about 12 hours later.                                      Spiriva Plan B= Backup  Only use your levoalbuterol (xopenex) as a rescue medication    Plan C = Crisis - only do this if you try plan B 1st and it doesn't work / albuterol neb  2.5 mg up to every 4 hours if you must  Plan D = Doctor call if plan C doesn't work Plan E = Emergency room , go there if all else fails Walk on 6lpm/ do 4lpm otherwise.    02/08/2015 f/u ov/Wert re:  Morbid obesity/ GOLD II copd/ 02 dep resp failure  Chief Complaint  Patient presents with  . Follow-up    Pt states her breathing has improved "off and on".  She is using xopenex inhaler about 2 x per day.     Would like to be able to walk better at [place] but never tried xopenex first and confused with 02 issues /  Sleeps in recliner due to back and breathing around once a week wakes up coughing (sleeps in recliner due to breathing back knee)   rec Before shopping, try 2 pffs of xopenex and use 6lpm  Prilosec should be taken 30-60 min before supper and add pepcid 20 mg at bedtime if continue to cough at bedtime GERD diet    03/23/2015  f/u ov/Wert re: GOLD II COPD / vsd  Chief Complaint  Patient presents with  . Follow-up    Pt states that her breathing is unchanged. She c/o increased cough- esp at night, prod with yellow to green sputum.  She is using xopenex for rescue 1-2 x per wk.    Can't even walk room to room  On 4lpm (note was instructed to use 6lpm walking)  Cough bothers her most when trying to sleep and in early am  >>change to Stiolto , O2 6lm   05/08/2015 Follow up : COPD GOLD II /VSD /O2 dependent Patient presents for a 6 week follow-up and medication review. We reviewed all her medications organize them into a medication calendar with patient education. Appears  to be  taking correctly.  Last visit. Patient was instructed to increase her oxygen to 6 L with walking.  Trial of stiolto in place of spiriva . Did not like Stiolto , and was too expensive. Went back to spiriva .  Went back on symbicort , feels better on it.  Complains of increased SOB, chest congestion, sinus pressure, and prod cough with yellow mucus for 1 week.  Denies chest tightness, sinus drainage, fever, nausea or vomiting.      Current Medications, Allergies, Complete Past Medical History, Past Surgical History, Family History, and Social History were reviewed in [employer] record.  ROS  The following are not active complaints unless bolded sore throat, dysphagia, dental problems, itching, sneezing,  nasal congestion or excess/ purulent secretions, ear ache,   fever, chills, sweats, unintended wt loss, classically pleuritic or exertional cp, hemoptysis,  orthopnea pnd or leg swelling, presyncope, palpitations, abdominal pain, anorexia, nausea, vomiting, diarrhea  or change in bowel or bladder habits, change in stools or  urine, dysuria,hematuria,  rash, arthralgias, visual complaints, headache, numbness, weakness or ataxia or problems with walking or coordination,  change in mood/affect or memory.               Objective:   Physical Exam     02/08/2015         223 > 03/23/2015   219  >217   Vital signs reviewed   HEENT: nl dentition, turbinates, and orophanx. Nl external ear canals without cough reflex   NECK :  without JVD/Nodes/TM/ nl carotid upstrokes bilaterally   LUNGS few trace rhonchi   CV:  RRR  no s3 or murmur or increase in P2, no edema   ABD:  soft and nontender with nl excursion in the supine position. No bruits or organomegaly, bowel sounds nl  MS:  warm without deformities, calf tenderness, cyanosis or clubbing  SKIN: warm and dry without lesions    NEURO:  alert, approp, no deficits      CXR PA and Lateral:   12/28/2014 :         Chronic fibrotic type change in the lung bases, stable. No new opacity. No change in cardiac silhouette.    Assessment:

## 2015-05-08 NOTE — Progress Notes (Signed)
Chart and office note reviewed in detail  > agree with a/p as outlined    

## 2015-05-08 NOTE — Assessment & Plan Note (Signed)
Cont on O2 4lm rest and 6 lm act

## 2015-05-08 NOTE — Telephone Encounter (Signed)
Pt was seen in office today by TP. She is requesting to change providers from Dr. Melvyn Novas to Dr. Lake Bells. Please advise Dr. Melvyn Novas thanks

## 2015-05-08 NOTE — Patient Instructions (Addendum)
Augmentin 875mg  Twice daily  For 7 days with food.  Mucinex DM Twice daily  As needed  Cough/congestion  Follow med calendar closely and bring to each visit.  Follow up  In 3 months and As needed   Please contact office for sooner follow up if symptoms do not improve or worsen or seek emergency care

## 2015-05-08 NOTE — Telephone Encounter (Signed)
Dr. Lake Bells would you be okay with this switch?

## 2015-05-09 NOTE — Addendum Note (Signed)
Addended by: Osa Craver on: 05/09/2015 11:42 AM   Modules accepted: Orders, Medications

## 2015-05-09 NOTE — Telephone Encounter (Signed)
BQ, please advise if you are okay with patient switching.

## 2015-05-10 NOTE — Telephone Encounter (Signed)
OK by me 

## 2015-05-10 NOTE — Telephone Encounter (Signed)
Called spoke with pt. appt scheduled to see BQ in March for her 3 month follow up. Nothing further needed

## 2015-05-15 DIAGNOSIS — Z961 Presence of intraocular lens: Secondary | ICD-10-CM | POA: Diagnosis not present

## 2015-05-15 DIAGNOSIS — E119 Type 2 diabetes mellitus without complications: Secondary | ICD-10-CM | POA: Diagnosis not present

## 2015-05-15 DIAGNOSIS — H40003 Preglaucoma, unspecified, bilateral: Secondary | ICD-10-CM | POA: Diagnosis not present

## 2015-05-15 DIAGNOSIS — H40023 Open angle with borderline findings, high risk, bilateral: Secondary | ICD-10-CM | POA: Diagnosis not present

## 2015-05-15 DIAGNOSIS — H18413 Arcus senilis, bilateral: Secondary | ICD-10-CM | POA: Diagnosis not present

## 2015-05-15 DIAGNOSIS — H3589 Other specified retinal disorders: Secondary | ICD-10-CM | POA: Diagnosis not present

## 2015-05-15 DIAGNOSIS — H04123 Dry eye syndrome of bilateral lacrimal glands: Secondary | ICD-10-CM | POA: Diagnosis not present

## 2015-05-15 DIAGNOSIS — Z7984 Long term (current) use of oral hypoglycemic drugs: Secondary | ICD-10-CM | POA: Diagnosis not present

## 2015-05-15 DIAGNOSIS — I10 Essential (primary) hypertension: Secondary | ICD-10-CM | POA: Diagnosis not present

## 2015-05-15 DIAGNOSIS — H5340 Unspecified visual field defects: Secondary | ICD-10-CM | POA: Diagnosis not present

## 2015-05-15 DIAGNOSIS — H35033 Hypertensive retinopathy, bilateral: Secondary | ICD-10-CM | POA: Diagnosis not present

## 2015-05-15 DIAGNOSIS — H16143 Punctate keratitis, bilateral: Secondary | ICD-10-CM | POA: Diagnosis not present

## 2015-05-30 ENCOUNTER — Encounter: Payer: Self-pay | Admitting: Cardiology

## 2015-05-30 ENCOUNTER — Ambulatory Visit (INDEPENDENT_AMBULATORY_CARE_PROVIDER_SITE_OTHER): Payer: Medicare Other | Admitting: Cardiology

## 2015-05-30 VITALS — BP 148/70 | HR 114 | Ht 65.0 in | Wt 217.0 lb

## 2015-05-30 DIAGNOSIS — I272 Other secondary pulmonary hypertension: Secondary | ICD-10-CM | POA: Diagnosis not present

## 2015-05-30 DIAGNOSIS — I480 Paroxysmal atrial fibrillation: Secondary | ICD-10-CM

## 2015-05-30 DIAGNOSIS — I5032 Chronic diastolic (congestive) heart failure: Secondary | ICD-10-CM | POA: Diagnosis not present

## 2015-05-30 DIAGNOSIS — I10 Essential (primary) hypertension: Secondary | ICD-10-CM

## 2015-05-30 DIAGNOSIS — I4711 Inappropriate sinus tachycardia, so stated: Secondary | ICD-10-CM

## 2015-05-30 DIAGNOSIS — R000 Tachycardia, unspecified: Secondary | ICD-10-CM | POA: Diagnosis not present

## 2015-05-30 NOTE — Progress Notes (Signed)
PCP: Marton Redwood, MD  Clinic Note: Chief Complaint  Patient presents with  . 3 MONTHS  . Dizziness  . Shortness of Breath  . Chest Pain  . Edema  . Atrial Fibrillation  . Congestive Heart Failure    Diastolic    HPI: Destiny Shelton is a 69 y.o. female with a Cardiovascular Problem List below who presents today for a six-month followup for paroxysmal A. fib, presents tachycardia, atrial tachycardia, chronic diastolic heart failure and hypertension along with oxygen requiring COPD.  She is limited to how much exertion she can do and has chronic exertional dyspnea. She has a rapid resting heart rate but it does get them at home into the 80s.  She was very symptomatic when she had atrial fibrillation in the past. BI-RAD R. Catheterization, she was noted to have a mild left-to-right shunt with Qp/Qs > 1, however most of her pulmonology notes indicate right-to-left shunting. TEE did not indicate any evidence of intracardiac shunt despite pulmonology notes to that effect. She did have a bubble study which suggested potential right-to-left shunting which would likely be related to pulmonary AVMs and not intracardiac. Right heart cath Did not suggest significant elevated right heart pressures. Pulmonologist notes question worsening shunting. The desaturations. This was again inserted they're questioning a right-to-left shunt which does not go along with the heart catheterization findings.  She was last seen In Sept 2016- was essentially "stable at that time".  I started her on digoxin in hopes that this would help her rate control, but that has not been effective.  No new studies to review. No recent hospitalizations.  Interval History:  Marvin presents today for routine followup.  Single but more frustrated this time around, stating that her dyspnea has gotten worse and her oxygen requirements have increased. She now feels that she is more short of breath than she had been. She still sleeps at  about 30-45 elevation with significant orthopnea. No significant edema - but has been taking additional dose of Lasix off and on.  She denies any sensation of of rapid irregular heartbeats that would suggest atrial fibrillation. She was extremely sensitive when she did revert into fibrillation and therefore probably would know.  She also notes a positional dizziness/lightheadedness - most notable in the morning.  She does not note her regular sinus tachycardia and has not felt any palpitations.  No syncope or near syncope but has had some dizziness. No TIAs with amaurosis fugax symptoms. She generally feels fatigued and tired no energy that has been cut a progressive thing over the last year or so. She still has GERD-like sensations, no anginal type chest pain. She is on standing O2 by [place] 4L - increases for exertion to ~5-6L  It's been some time since her last COPD exacerbation  As opposed to her last visit when she was recovering from a COPD flare.  As usual,she denies any chest tightness or pressure with rest or exertion Relatively well-controlled swelling, chronic orthopnea sleeping in a chair. No notable PND as well as a stable ankle edema on her current dose Lasix. She is currently 80 mg twice a day on a sliding scale when necessary regimen.  No claudication. She denies any complaints of melena, hematochezia, hematuria, epistaxis.  Past Medical History - Cardiovascular/Pulmonary  Diagnosis Date  . Hypertension   . Diabetes mellitus type 2 with neurological manifestations     On Insulin  . Morbidly obese   . Hyperlipemia   . COPD (chronic  obstructive pulmonary disease)     Requiring Home O2 at 4L  . Atrial tachycardia     Mostly Sinus Tachycardia  . PAF (paroxysmal atrial fibrillation)   . Inappropriate sinus tachycardia   . Community acquired pneumonia - Recent Admission (07/2013) 07/08/2013  . Sleep apnea     no cpap machine  02 at 4l/min all the time    Prior Cardiac Evaluation  and Past Surgical History: Procedure Laterality Date  . Tee without cardioversion  03/24/2012    Procedure: TRANSESOPHAGEAL ECHOCARDIOGRAM (TEE);  Surgeon: Pixie Casino, MD;  [facility]: [facility];  Service: Cardiovascular;  Laterality: N/A;  TEE with Bubble  . Right and Left Cardiac Catheterization  July 2013    RAP: 22 mmHg, and RVP: 46/16 mmHg, RV EDP 18; L. BP 110/11 mmHg, LVEDP 20 mmHg;  PAP 45/29 mmHg (mean 37 mmHg); PCWP 29/22 mmHg - 26 mmHg; no significant coronary disease   Current Outpatient Prescriptions on File Prior to Visit  Medication Sig Dispense Refill  . acetaminophen (TYLENOL) 500 MG tablet Take per bottle as needed for pain    . albuterol (PROVENTIL) (2.5 MG/3ML) 0.083% nebulizer solution Take 3 mLs (2.5 mg total) by nebulization every 6 (six) hours as needed for wheezing or shortness of breath. (Patient taking differently: Take 2.5 mg by nebulization every 4 (four) hours as needed for wheezing or shortness of breath. ) 75 mL 3  . ALPRAZolam (XANAX) 0.5 MG tablet Take 1/2 tablet by mouth daily as needed for anxiety    . AMBULATORY NON FORMULARY MEDICATION Continuous O2 @4LMP     . atorvastatin (LIPITOR) 40 MG tablet Take 1 tablet by mouth at bedtime.     . benzonatate (TESSALON) 200 MG capsule Take 400 mg by mouth every 8 (eight) hours.     . budesonide-formoterol (SYMBICORT) 160-4.5 MCG/ACT inhaler Inhale 2 puffs into the lungs 2 (two) times daily.    Marland Kitchen buPROPion (WELLBUTRIN XL) 150 MG 24 hr tablet Take 300 mg by mouth every morning.     Marland Kitchen dextromethorphan-guaiFENesin (MUCINEX DM) 30-600 MG 12hr tablet Take 1 tablet by mouth 2 (two) times daily.    . digoxin (LANOXIN) 0.125 MG tablet Take 1 tablet (0.125 mg total) by mouth daily. (Patient taking differently: Take 0.125 mg by mouth every morning. ) 30 tablet 11  . docusate sodium (COLACE) 100 MG capsule Take 100 mg by mouth every morning.     . DULoxetine (CYMBALTA) 60 MG capsule Take 60 mg by mouth every morning.     .  furosemide (LASIX) 40 MG tablet Take 40 mg by mouth 2 (two) times daily.     Marland Kitchen gabapentin (NEURONTIN) 600 MG tablet Take 600 mg by mouth 3 (three) times daily.     Marland Kitchen HYDROcodone-acetaminophen (NORCO/VICODIN) 5-325 MG per tablet Take 1 tablet by mouth every 4 (four) hours as needed for moderate pain.     Marland Kitchen insulin aspart (NOVOLOG) 100 UNIT/ML injection Inject 8 Units into the skin 2 (two) times daily.     . insulin glargine (LANTUS) 100 UNIT/ML injection Inject 70 Units into the skin daily before breakfast. 10 mL   . irbesartan (AVAPRO) 150 MG tablet Take 1 tablet (150 mg total) by mouth daily. (Patient taking differently: Take 150 mg by mouth every morning. ) 30 tablet 3  . levalbuterol (XOPENEX HFA) 45 MCG/ACT inhaler Inhale 1-2 puffs into the lungs every 4 (four) hours as needed for wheezing. (Patient taking differently: Inhale 1-2 puffs into the lungs every  4 (four) hours as needed for wheezing or shortness of breath. ) 1 Inhaler 12  . Multiple Vitamin (MULTIVITAMIN) tablet Take 1 tablet by mouth every morning.     Marland Kitchen omeprazole (PRILOSEC) 20 MG capsule Take 20 mg by mouth daily before breakfast.     . ONE TOUCH ULTRA TEST test strip 1 each by Other route 3 (three) times daily.   4  . OXYGEN Increase oxygen to 6L with acitivity    . potassium chloride (KLOR-CON) 10 MEQ CR tablet Take 10 mEq by mouth 3 (three) times daily.     . rivaroxaban (XARELTO) 20 MG TABS tablet Take 1 tablet (20 mg total) by mouth daily with supper. Restart on Wednesday May 11 , 2016 30 tablet   . tiotropium (SPIRIVA) 18 MCG inhalation capsule Place 18 mcg into inhaler and inhale every morning.    . verapamil (VERELAN PM) 360 MG 24 hr capsule TAKE ONE CAPSULE BY MOUTH DAILY (Patient taking differently: TAKE ONE CAPSULE BY MOUTH EVERY MORNING) 30 capsule 1   No current facility-administered medications on file prior to visit.   Allergies  Allergen Reactions  . Aspirin Shortness Of Breath  . Nsaids Shortness Of Breath    Social History  Substance Use Topics  . Smoking status: Former Smoker -- 2.00 packs/day for 30 years    Types: Cigarettes    Quit date: 06/02/1994  . Smokeless tobacco: Never Used  . Alcohol Use: No   Family History  Problem Relation Age of Onset  . Heart disease Mother   . Stroke Mother   . Heart disease Father   . Heart disease Brother   . Stroke Brother   . Skin cancer Brother   . Colon cancer Neg Hx   . Colon polyps Sister   . Diabetes Sister   . Diabetes Brother   . Irritable bowel syndrome Daughter     ROS: A comprehensive Review of Systems - was performed Review of Systems  Constitutional: Positive for malaise/fatigue.  HENT: Negative for nosebleeds.   Respiratory: Positive for cough (morning cough), shortness of breath (Stable, if not improved from baseline. No longer ride a stationary bike for 15 minutes at a time. Now short of breath simply walking around the house.) and wheezing.        On home O2. 4 L  Cardiovascular: Positive for leg swelling (Mild). Negative for chest pain, palpitations, orthopnea and PND.       Per history of present illness  Gastrointestinal: Negative for blood in stool and melena.  Genitourinary: Negative for hematuria.  Musculoskeletal: Positive for joint pain.  Neurological: Negative for dizziness, weakness and headaches.  Endo/Heme/Allergies: Bruises/bleeds easily.  Psychiatric/Behavioral: Positive for depression (Probably more related to her lack of improvement and actually worsening of her pulmonary status.Marland Kitchen).  All other systems reviewed and are negative.  Wt Readings from Last 3 Encounters:  05/30/15 217 lb (98.431 kg)  05/08/15 217 lb (98.431 kg)  03/23/15 219 lb (99.338 kg)   PHYSICAL EXAM BP 148/70 mmHg  Pulse 114  Ht 5\' 5"  (1.651 m)  Wt 217 lb (98.431 kg)  BMI 36.11 kg/m2 General appearance: A&O x3; chronically ill-appearing obese woman who is in no acute distress. Wearing chronic home O2 from her concentrator @ 4 Lpm.  No increased work of breathing from her baseline. Neck: no carotid bruit, no JVD and supple, symmetrical, trachea midline  Lungs: Nonlabored,  But with accessory muscle use.  No true wheezes but there is scattered rhonchorous  expirations bilaterally. Occasional coughing  Baseline sensory muscle use. Heart: Mildly tachycardic; Regular rhythm. Normal S1-S2, no M./R./G. Unable to palpate PMI due to body habitus.  Abdomen: soft, NT/ND/NABS, Unable to palpate HSM due to body habitus. No HJR  Extremities: edema Trace, no ulcers, gangrene or trophic changes and Mild spider veins, no significant varicosities  Pulses: 2+ and symmetric  Skin: Skin color, texture, turgor normal. No rashes or lesions  Neurologic: Grossly normal; pleasant but somewhat depressed and dejected mood.   Adult ECG Report  Rate: 114;  Rhythm: sinus tachycardia otherwise essentially normal EKG normal axis, intervals, duration and voltage.   Narrative Interpretation:  stable EKG   Recent Labs  none checked since May.  ASSESSMENT / PLAN: Problem List Items Addressed This Visit    Pulmonary hypertension ([place]) (Chronic)    That we may have a shunt based on bubble study on PE, but it did not appear to be intracardiac. There is suggestion of right to left shunting, the right heart cath suggested left-to-right shunting. At this point we may need to consider relook right heart cath, but with consideration of possible pulmonary hypertension, I would prefer to have her evaluated by our heart failure specialists in order todetermine if any additional treatment options are available..      Relevant Orders   EKG 12-Lead (Completed)   Ambulatory referral to Cardiology   PAF (paroxysmal atrial fibrillation) (HCC) (Chronic)    Maintaining sinus rhythm. She has been able to maintain rate control with digoxin and verapamil.  Se is anticoagulated with Xarelto.  This patients CHA2DS2-VASc Score and unadjusted Ischemic Stroke Rate (% per year) is  equal to 4.8 % stroke rate/year from a score of 4  Above score calculated as 1 point each if present [CHF, HTN, DM, Vascular=MI/PAD/Aortic Plaque, Age if 65-74, or Female] Above score calculated as 2 points each if present [Age > 75, or Stroke/TIA/TE]        Inappropriate sinus tachycardia (HCC) (Chronic)    She still has elevated heart rate despite high-dose verapamil and digoxin. Overall not as symptomatic with sinus tachycardia, very symptomatic with A. Fib.      Relevant Orders   EKG 12-Lead (Completed)   Ambulatory referral to Cardiology   Essential hypertension - Primary (Chronic)    Has been well controlled. Continue current medications.      Relevant Orders   EKG 12-Lead (Completed)   Ambulatory referral to Cardiology   Chronic diastolic heart failure of unknown etiology ([place]) (Chronic)    She seems little bit more dyspneic than usual. This is very difficult to control home. Her EF was relatively normal on. Did not tolerate beta blockers, so I have her on verapamil for rate control along with digoxin. She is on standard dose of Lasix with additional doses when necessary. She's been doing relatively well at that, but now she seems to be decompensating a little bit. At this point, I am running out of options of what is the correcting good here. My feeling would be that she may require additional evaluation potentially with another right heart cath and potentially pulmonary hypertension. I would like to refer her to our heart failure clinic to see Dr. Haroldine Laws or Dr. Marigene Ehlers in order to get their advice as to the additional evaluation and treatment options.  We may simply be a stage of progression of chronic disease where we may need to consider quality of life discussions.        PATIENT INSTRUCTIONS:  You have been referred to  Dr Haroldine Laws OR DR [facility]- FOR PULM HTN  Your physician wants you to follow-up in 6  MONTHS WITH DR HARDING- 30 MINS.   Orders Placed This  Encounter  Procedures  . Ambulatory referral to Cardiology    Referral Priority:  Routine    Referral Type:  Consultation    Referral Reason:  Specialty Services Required    Referred to Provider:  Jolaine Artist, MD    Requested Specialty:  Cardiology    Number of Visits Requested:  1  . EKG 12-Lead   No orders of the defined types were placed in this encounter.     Leonie Man, M.D., M.S. Interventional Cardiologist   Pager # [phone]

## 2015-05-30 NOTE — Patient Instructions (Signed)
You have been referred to  Dr Haroldine Laws OR DR [facility]- FOR PULM HTN  Your physician wants you to follow-up in 6  MONTHS WITH DR HARDING- 30 MINS. You will receive a reminder letter in the mail two months in advance. If you don't receive a letter, please call our office to schedule the follow-up appointment.   If you need a refill on your cardiac medications before your next appointment, please call your pharmacy.

## 2015-06-01 ENCOUNTER — Telehealth (HOSPITAL_COMMUNITY): Payer: Self-pay | Admitting: Vascular Surgery

## 2015-06-04 ENCOUNTER — Encounter: Payer: Self-pay | Admitting: Cardiology

## 2015-06-04 NOTE — Assessment & Plan Note (Signed)
Has been well-controlled. Continue current medications. 

## 2015-06-04 NOTE — Assessment & Plan Note (Signed)
Maintaining sinus rhythm. She has been able to maintain rate control with digoxin and verapamil.  Se is anticoagulated with Xarelto.  This patients CHA2DS2-VASc Score and unadjusted Ischemic Stroke Rate (% per year) is equal to 4.8 % stroke rate/year from a score of 4  Above score calculated as 1 point each if present [CHF, HTN, DM, Vascular=MI/PAD/Aortic Plaque, Age if 65-74, or Female] Above score calculated as 2 points each if present [Age > 75, or Stroke/TIA/TE]

## 2015-06-04 NOTE — Assessment & Plan Note (Signed)
She seems little bit more dyspneic than usual. This is very difficult to control home. Her EF was relatively normal on. Did not tolerate beta blockers, so I have her on verapamil for rate control along with digoxin. She is on standard dose of Lasix with additional doses when necessary. She's been doing relatively well at that, but now she seems to be decompensating a little bit. At this point, I am running out of options of what is the correcting good here. My feeling would be that she may require additional evaluation potentially with another right heart cath and potentially pulmonary hypertension. I would like to refer her to our heart failure clinic to see Dr. Haroldine Laws or Dr. Marigene Ehlers in order to get their advice as to the additional evaluation and treatment options.  We may simply be a stage of progression of chronic disease where we may need to consider quality of life discussions.

## 2015-06-04 NOTE — Assessment & Plan Note (Signed)
She still has elevated heart rate despite high-dose verapamil and digoxin. Overall not as symptomatic with sinus tachycardia, very symptomatic with A. Fib.

## 2015-06-04 NOTE — Assessment & Plan Note (Signed)
That we may have a shunt based on bubble study on PE, but it did not appear to be intracardiac. There is suggestion of right to left shunting, the right heart cath suggested left-to-right shunting. At this point we may need to consider relook right heart cath, but with consideration of possible pulmonary hypertension, I would prefer to have her evaluated by our heart failure specialists in order todetermine if any additional treatment options are available.Marland Kitchen

## 2015-06-05 ENCOUNTER — Telehealth (HOSPITAL_COMMUNITY): Payer: Self-pay | Admitting: Vascular Surgery

## 2015-06-05 ENCOUNTER — Telehealth: Payer: Self-pay | Admitting: Cardiology

## 2015-06-05 NOTE — Telephone Encounter (Signed)
Returned call to patient.She stated she never received appointment with heart failure clinic.Advised I will find out appointment and call back.

## 2015-06-05 NOTE — Telephone Encounter (Signed)
She wants to know if they have made an appointment with the doctor who Dr Warren Lacy was referring her?

## 2015-06-05 NOTE — Telephone Encounter (Signed)
Dr. Darcus Pester office called to get this pt scheduled to see one of the HF Dr is this pt ok to get scheduled?

## 2015-06-05 NOTE — Telephone Encounter (Signed)
Spoke to [place] at [facility].She stated she sent a message to their nurse about a appointment and is waiting.Stated she will call patient with appointment.

## 2015-06-06 NOTE — Telephone Encounter (Signed)
Yes, please schedule.

## 2015-06-07 NOTE — Telephone Encounter (Signed)
Called pt to give new pt appt

## 2015-06-14 DIAGNOSIS — Z1231 Encounter for screening mammogram for malignant neoplasm of breast: Secondary | ICD-10-CM | POA: Diagnosis not present

## 2015-06-21 DIAGNOSIS — R300 Dysuria: Secondary | ICD-10-CM | POA: Diagnosis not present

## 2015-06-21 DIAGNOSIS — N390 Urinary tract infection, site not specified: Secondary | ICD-10-CM | POA: Diagnosis not present

## 2015-06-28 ENCOUNTER — Encounter (HOSPITAL_COMMUNITY): Payer: Self-pay | Admitting: *Deleted

## 2015-06-28 ENCOUNTER — Encounter (HOSPITAL_COMMUNITY): Payer: Self-pay

## 2015-06-28 ENCOUNTER — Other Ambulatory Visit (HOSPITAL_COMMUNITY): Payer: Self-pay | Admitting: *Deleted

## 2015-06-28 ENCOUNTER — Ambulatory Visit (HOSPITAL_COMMUNITY)
Admission: RE | Admit: 2015-06-28 | Discharge: 2015-06-28 | Disposition: A | Discharge status: Home / Self Care | Payer: Medicare Other | Source: Ambulatory Visit | Attending: Cardiology | Admitting: Cardiology

## 2015-06-28 VITALS — BP 120/60 | HR 115 | Wt 215.8 lb

## 2015-06-28 DIAGNOSIS — E119 Type 2 diabetes mellitus without complications: Secondary | ICD-10-CM | POA: Insufficient documentation

## 2015-06-28 DIAGNOSIS — E669 Obesity, unspecified: Secondary | ICD-10-CM | POA: Diagnosis not present

## 2015-06-28 DIAGNOSIS — R000 Tachycardia, unspecified: Secondary | ICD-10-CM

## 2015-06-28 DIAGNOSIS — G4733 Obstructive sleep apnea (adult) (pediatric): Secondary | ICD-10-CM | POA: Diagnosis not present

## 2015-06-28 DIAGNOSIS — Z87891 Personal history of nicotine dependence: Secondary | ICD-10-CM | POA: Insufficient documentation

## 2015-06-28 DIAGNOSIS — R0602 Shortness of breath: Secondary | ICD-10-CM | POA: Diagnosis not present

## 2015-06-28 DIAGNOSIS — I110 Hypertensive heart disease with heart failure: Secondary | ICD-10-CM | POA: Diagnosis not present

## 2015-06-28 DIAGNOSIS — Z833 Family history of diabetes mellitus: Secondary | ICD-10-CM | POA: Diagnosis not present

## 2015-06-28 DIAGNOSIS — J449 Chronic obstructive pulmonary disease, unspecified: Secondary | ICD-10-CM | POA: Insufficient documentation

## 2015-06-28 DIAGNOSIS — Z9981 Dependence on supplemental oxygen: Secondary | ICD-10-CM | POA: Insufficient documentation

## 2015-06-28 DIAGNOSIS — R0902 Hypoxemia: Secondary | ICD-10-CM | POA: Insufficient documentation

## 2015-06-28 DIAGNOSIS — I5022 Chronic systolic (congestive) heart failure: Secondary | ICD-10-CM

## 2015-06-28 DIAGNOSIS — Z79899 Other long term (current) drug therapy: Secondary | ICD-10-CM | POA: Insufficient documentation

## 2015-06-28 DIAGNOSIS — Z794 Long term (current) use of insulin: Secondary | ICD-10-CM | POA: Insufficient documentation

## 2015-06-28 DIAGNOSIS — Z7902 Long term (current) use of antithrombotics/antiplatelets: Secondary | ICD-10-CM | POA: Diagnosis not present

## 2015-06-28 DIAGNOSIS — Z823 Family history of stroke: Secondary | ICD-10-CM | POA: Diagnosis not present

## 2015-06-28 DIAGNOSIS — I480 Paroxysmal atrial fibrillation: Secondary | ICD-10-CM | POA: Diagnosis not present

## 2015-06-28 DIAGNOSIS — I5032 Chronic diastolic (congestive) heart failure: Secondary | ICD-10-CM | POA: Diagnosis not present

## 2015-06-28 DIAGNOSIS — J9611 Chronic respiratory failure with hypoxia: Secondary | ICD-10-CM

## 2015-06-28 LAB — BASIC METABOLIC PANEL
ANION GAP: 10 (ref 5–15)
BUN: 10 mg/dL (ref 6–20)
CHLORIDE: 102 mmol/L (ref 101–111)
CO2: 26 mmol/L (ref 22–32)
Calcium: 9.1 mg/dL (ref 8.9–10.3)
Creatinine, Ser: 0.96 mg/dL (ref 0.44–1.00)
GFR calc Af Amer: >60 mL/min (ref >60)
GFR calc non Af Amer: 59 mL/min — ABNORMAL LOW (ref >60)
GLUCOSE: 93 mg/dL (ref 65–99)
POTASSIUM: 3.9 mmol/L (ref 3.5–5.1)
Sodium: 138 mmol/L (ref 135–145)

## 2015-06-28 LAB — CBC
HEMATOCRIT: 32.9 % — AB (ref 36.0–46.0)
HEMOGLOBIN: 8.9 g/dL — AB (ref 12.0–15.0)
MCH: 19.9 pg — AB (ref 26.0–34.0)
MCHC: 27.1 g/dL — AB (ref 30.0–36.0)
MCV: 73.6 fL — AB (ref 78.0–100.0)
Platelets: 287 10*3/uL (ref 150–400)
RBC: 4.47 MIL/uL (ref 3.87–5.11)
RDW: 19 % — ABNORMAL HIGH (ref 11.5–15.5)
WBC: 10.2 10*3/uL (ref 4.0–10.5)

## 2015-06-28 LAB — PROTIME-INR
INR: 1.2 (ref 0.00–1.49)
Prothrombin Time: 15.4 seconds — ABNORMAL HIGH (ref 11.6–15.2)

## 2015-06-28 MED ORDER — IVABRADINE HCL 5 MG PO TABS: 5.0000 mg | ORAL_TABLET | Freq: Two times a day (BID) | ORAL | Status: DC

## 2015-06-28 NOTE — Patient Instructions (Signed)
Increase Lasix to 60mg  twice a day.  Increase Potassium to 20mg  twice a day.  Start Corlanor 5mg  twice a day.  Follow up: 2 weeks after heart cath   Your provider requests you have a Right Heart Catherization..... Please hold your Xarelto 2 days before your cath.   Your provider requests you have an Echocardiogram. Echocardiogram An echocardiogram, or echocardiography, uses sound waves (ultrasound) to produce an image of your heart. The echocardiogram is simple, painless, obtained within a short period of time, and offers valuable information to your health care provider. The images from an echocardiogram can provide information such as:  Evidence of coronary artery disease (CAD).  Heart size.  Heart muscle function.  Heart valve function.  Aneurysm detection.  Evidence of a past heart attack.  Fluid buildup around the heart.  Heart muscle thickening.  Assess heart valve function.

## 2015-06-28 NOTE — Progress Notes (Signed)
Patient ID: Destiny Shelton, female   DOB: [birth year], 70 y.o.   MRN: [id number] PCP: Dr. Brigitte Pulse Cardiology: Dr Ellyn Hack HF Cardiology: Dr. Aundra Dubin  70 yo with history of paroxysmal atrial fibrillation, chronic diastolic CHF with restrictive hemodynamics on 2013 RHC, COPD on home oxygen, inappropriate sinus tachycardia, and concern for intracardiac shunting presents for CHF clinic evaluation.  She has been followed for several years by both cardiology and pulmonology.  She is a prior smoker and has been diagnosed with COPD.  Interestingly, her PFTs in 10/16 showed improvement, suggesting mild obstructive airways disease.  However, she still requires home oxygen.  She had transcranial dopplers in 2013 suggesting a medium-sized right to left shunt.  This led to an extensive workup.  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  Cardiac MRI was unremarkable, no evidence for shunt lesion.  RHC/LHC showed no coronary disease but it did show restrictive hemodynamics and volume overload.  Qp/Qs from this study was 1.27/1, suggesting a relatively small left to right shunt.  A definite shunt lesion was never identified.    She remains in NSR most of the time.  When she is in atrial fibrillation, she gets more short of breath.  Recently, she has been getting more short of breath generally.  She uses 4L oxygen at rest and 6L with exertion.  She is short of breath now with even short walks like from her bed to the bathroom.  She had to stop twice today walking in from the parking garage.  Rare lightheadedness, no syncope.  She sleeps in a recliner due to orthopnea.  She has done this for a while.  No chest pain.  She has back and knee pain.  No melena/BRBPR on Xarelto.    Labs (9/16): digoxin 0.6, K 4.3, creatinine 0.85  ECG: sinus tachycardia at 112 bpm  PMH: 1. Atrial fibrillation: Paroxysmal.  2. HTN 3. COPD: Home oxygen.  PFTs (10/16) with FVC 77%, FEV1 68%, TLC 89% => mild obstruction (improved  from the past).   4. Type II diabetes 5. Obesity 6. Inappropriate sinus tachycardia 7. OSA: Does not use CPAP, uses oxygen at night.  8. Chronic diastolic CHF: RHC/LHC in 0000000 with no significant CAD, mean RA 19, RV 46/18, PA 45/29 mean 37, mean PCWP 22, CI 4.1, PVR 1.4 WU => restrictive hemodynamics with no evidence for constriction;  Qp/Qs 1.27/1; saturation run difficult to interpret.  TEE (10/13): EF 55-60%, LVH, mild MR, bubble study showed no evidence for immediate R=>L bubble crossing, small amount of bubbles ended up in left heart > 5 seconds after injection suggesting possible intrapulmonary shunting; no shunt by color doppler.  Cardiac MRI (8/13) with EF 59%, no LGE, normal RV size/systolic function => interpreted as normal.  9. ?Intracardiac shunt: Transcranial dopplers in 10/13 were suggestive of medium-sized right to left shunt.  Bubble study on TEE in 10/13 showed no evidence for immediate R=>L bubble crossing, small amount of bubbles ended up in left heart > 5 seconds after injection suggesting possible intrapulmonary shunting; no shunt by color doppler. Cardiac MRI showed no evidence for shunting.  Cardiac cath in 2013 actually was suggestive of a small left to right shunt.    SH: Married, prior heavy smoker (quit 1996).  No ETOH.   Family History  Problem Relation Age of Onset  . Heart disease Mother   . Stroke Mother   . Heart disease Father   . Heart disease Brother   . Stroke Brother   .  Skin cancer Brother   . Colon cancer Neg Hx   . Colon polyps Sister   . Diabetes Sister   . Diabetes Brother   . Irritable bowel syndrome Daughter    ROS: All systems reviewed and negative except as per HPI.   Current Outpatient Prescriptions  Medication Sig Dispense Refill  . acetaminophen (TYLENOL) 500 MG tablet Take per bottle as needed for pain    . albuterol (PROVENTIL) (2.5 MG/3ML) 0.083% nebulizer solution Take 3 mLs (2.5 mg total) by nebulization every 6 (six) hours as needed  for wheezing or shortness of breath. (Patient taking differently: Take 2.5 mg by nebulization every 4 (four) hours as needed for wheezing or shortness of breath. ) 75 mL 3  . ALPRAZolam (XANAX) 0.5 MG tablet Take 1/2 tablet by mouth daily as needed for anxiety    . AMBULATORY NON FORMULARY MEDICATION Continuous O2 @4LMP     . atorvastatin (LIPITOR) 40 MG tablet Take 1 tablet by mouth at bedtime.     . benzonatate (TESSALON) 200 MG capsule Take 400 mg by mouth every 8 (eight) hours.     . budesonide-formoterol (SYMBICORT) 160-4.5 MCG/ACT inhaler Inhale 2 puffs into the lungs 2 (two) times daily.    Marland Kitchen buPROPion (WELLBUTRIN XL) 150 MG 24 hr tablet Take 300 mg by mouth every morning.     Marland Kitchen dextromethorphan-guaiFENesin (MUCINEX DM) 30-600 MG 12hr tablet Take 1 tablet by mouth 2 (two) times daily.    . digoxin (LANOXIN) 0.125 MG tablet Take 1 tablet (0.125 mg total) by mouth daily. 30 tablet 11  . DULoxetine (CYMBALTA) 60 MG capsule Take 60 mg by mouth every morning.     . furosemide (LASIX) 40 MG tablet Take 60 mg by mouth 2 (two) times daily.    Marland Kitchen gabapentin (NEURONTIN) 600 MG tablet Take 600 mg by mouth 3 (three) times daily.     Marland Kitchen HYDROcodone-acetaminophen (NORCO/VICODIN) 5-325 MG per tablet Take 1 tablet by mouth every 4 (four) hours as needed for moderate pain.     Marland Kitchen insulin aspart (NOVOLOG) 100 UNIT/ML injection Inject 8 Units into the skin 2 (two) times daily.     . insulin glargine (LANTUS) 100 UNIT/ML injection Inject 70 Units into the skin daily before breakfast. 10 mL   . irbesartan (AVAPRO) 150 MG tablet Take 1 tablet (150 mg total) by mouth daily. (Patient taking differently: Take 150 mg by mouth every morning. ) 30 tablet 3  . iron polysaccharides (NIFEREX) 150 MG capsule Take 150 mg by mouth daily.    Marland Kitchen levalbuterol (XOPENEX HFA) 45 MCG/ACT inhaler Inhale 1-2 puffs into the lungs every 4 (four) hours as needed for wheezing. (Patient taking differently: Inhale 1-2 puffs into the lungs every  4 (four) hours as needed for wheezing or shortness of breath. ) 1 Inhaler 12  . Multiple Vitamin (MULTIVITAMIN) tablet Take 1 tablet by mouth every morning.     Marland Kitchen omeprazole (PRILOSEC) 20 MG capsule Take 20 mg by mouth daily before breakfast.     . ONE TOUCH ULTRA TEST test strip 1 each by Other route 3 (three) times daily.   4  . OXYGEN Increase oxygen to 6L with acitivity    . polyethylene glycol (MIRALAX / GLYCOLAX) packet Take 17 g by mouth daily.    . potassium chloride (KLOR-CON) 10 MEQ CR tablet Take 20 mEq by mouth 2 (two) times daily.    . rivaroxaban (XARELTO) 20 MG TABS tablet Take 1 tablet (20 mg total) by  mouth daily with supper. Restart on Wednesday May 11 , 2016 30 tablet   . tiotropium (SPIRIVA) 18 MCG inhalation capsule Place 18 mcg into inhaler and inhale every morning.    . verapamil (VERELAN PM) 360 MG 24 hr capsule TAKE ONE CAPSULE BY MOUTH DAILY (Patient taking differently: TAKE ONE CAPSULE BY MOUTH EVERY MORNING) 30 capsule 1  . docusate sodium (COLACE) 100 MG capsule Take 100 mg by mouth every morning.     . ivabradine (CORLANOR) 5 MG TABS tablet Take 1 tablet (5 mg total) by mouth 2 (two) times daily with a meal. 60 tablet 3   No current facility-administered medications for this encounter.   BP 120/60 mmHg  Pulse 115  Wt 215 lb 12 oz (97.864 kg)  SpO2 92% General: NAD, obese, wearing oxygen.  Neck: JVP 10-12 cm, no thyromegaly or thyroid nodule.  Lungs: Clear to auscultation bilaterally with normal respiratory effort. CV: Nondisplaced PMI.  Heart tachy, regular S1/S2, no S3/S4, 2/6 HSM LLSB.  Trace edema right ankle.  No carotid bruit.  Normal pedal pulses.  Abdomen: Soft, nontender, no hepatosplenomegaly, no distention.  Skin: Intact without lesions or rashes.  Neurologic: Alert and oriented x 3.  Psych: Normal affect. Extremities: No clubbing or cyanosis.  HEENT: Normal.   Assessment/Plan: 1. COPD: On home oxygen, 4L at rest and 6L with exertion.  Her  dyspnea and hypoxemia do indeed seem somewhat out of proportion to her degree of COPD.  PFTs in 10/16 actually showed only mild obstruction (improved).  2. Atrial fibrillation: Paroxysmal, CHADSVASC = 4.  She remains in NSR today and has not felt atrial fibrillation recently.   - She will continue Xarelto.  - She is on verapamil and digoxin for rate control when she is in atrial fibrillation.  I will be getting an echo => if LV EF is normal and there is not significant RV dysfunction, would probably stop digoxin.   3. Chronic diastolic CHF: Restrictive hemodynamics from 2013 catheterization with normal EF by 2013 TEE and cardiac MRI.  NYHA class IIIb symptoms.  She is volume overloaded on exam.   - Needs repeat echo.  - She also needs repeat RHC to reassess filling pressures and to look for development of pulmonary arterial hypertension. We discussed risks/benefits of the procedure and she agrees to proceed.  - Increase Lasix to 60 mg bid with KCl 20 bid.  BMET/BNP today.  4. ?Shunt lesion: She had transcranial dopplers in 2013 suggesting a medium-sized right to left shunt.  This led to an extensive workup.  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  Cardiac MRI was unremarkable, no evidence for shunt lesion.  RHC/LHC showed no coronary disease but it did show restrictive hemodynamics and volume overload.  Qp/Qs from this study was 1.27/1, suggesting a relatively small left to right shunt.  A definite shunt lesion was never identified.  Based on prior workup, it seems most likely that the right to left shunting by transcranial dopplers and TEE bubble study was due to pulmonary AVMs.   - On RHC, I will do a shunt run to calculate Qp/Qs.   5. Inappropriate sinus tachycardia: HR 110s-120s at rest.  I will have her try Corlanor 5 mg bid to see if this helps her symptomatically.   Followup 2 wks after cath.   Loralie Champagne 06/28/2015

## 2015-06-29 ENCOUNTER — Telehealth (HOSPITAL_COMMUNITY): Payer: Self-pay | Admitting: Cardiology

## 2015-06-29 ENCOUNTER — Other Ambulatory Visit: Payer: Self-pay | Admitting: Cardiology

## 2015-06-29 ENCOUNTER — Telehealth (HOSPITAL_COMMUNITY): Payer: Self-pay | Admitting: *Deleted

## 2015-06-29 NOTE — Telephone Encounter (Signed)
No precert req for RHC 2/1

## 2015-06-29 NOTE — Telephone Encounter (Signed)
Patient called to report co pay for Corlanor id [id number] Patient cannot afford co pay Unsure if this is related to deductible and what options we can offer at this time  Will forward to HF pharmacist for further assistance

## 2015-06-29 NOTE — Telephone Encounter (Signed)
Rx request sent to pharmacy.  

## 2015-07-02 NOTE — Telephone Encounter (Signed)
After review of current medications, Corlanor is actually contraindicated with concurrent use of verapamil (CYP3A4 inhibitor) which may increase Corlanor concentrations >2 fold. Patient has not started using Corlanor and, per discussion with Dr. Aundra Dubin, have advised her to continue to hold off on use. Pharmacy notified to cancel Rx.   Ruta Hinds. Velva Harman, PharmD, BCPS, CPP Clinical Pharmacist Pager: [phone] Phone: [phone] 07/02/2015 12:25 PM

## 2015-07-04 ENCOUNTER — Ambulatory Visit (HOSPITAL_COMMUNITY)
Admission: RE | Admit: 2015-07-04 | Discharge: 2015-07-04 | Disposition: A | Discharge status: Home / Self Care | Payer: Medicare Other | Source: Ambulatory Visit | Attending: Cardiology | Admitting: Cardiology

## 2015-07-04 ENCOUNTER — Encounter (HOSPITAL_COMMUNITY)
Admission: RE | Disposition: A | Discharge status: Home / Self Care | Payer: Self-pay | Source: Ambulatory Visit | Attending: Cardiology

## 2015-07-04 ENCOUNTER — Encounter (HOSPITAL_COMMUNITY): Payer: Self-pay | Admitting: Cardiology

## 2015-07-04 DIAGNOSIS — J449 Chronic obstructive pulmonary disease, unspecified: Secondary | ICD-10-CM | POA: Insufficient documentation

## 2015-07-04 DIAGNOSIS — G4733 Obstructive sleep apnea (adult) (pediatric): Secondary | ICD-10-CM | POA: Insufficient documentation

## 2015-07-04 DIAGNOSIS — I272 Other secondary pulmonary hypertension: Secondary | ICD-10-CM | POA: Diagnosis not present

## 2015-07-04 DIAGNOSIS — Z7901 Long term (current) use of anticoagulants: Secondary | ICD-10-CM | POA: Insufficient documentation

## 2015-07-04 DIAGNOSIS — I110 Hypertensive heart disease with heart failure: Secondary | ICD-10-CM | POA: Insufficient documentation

## 2015-07-04 DIAGNOSIS — Z8249 Family history of ischemic heart disease and other diseases of the circulatory system: Secondary | ICD-10-CM | POA: Diagnosis not present

## 2015-07-04 DIAGNOSIS — Z794 Long term (current) use of insulin: Secondary | ICD-10-CM | POA: Insufficient documentation

## 2015-07-04 DIAGNOSIS — R0902 Hypoxemia: Secondary | ICD-10-CM | POA: Insufficient documentation

## 2015-07-04 DIAGNOSIS — E119 Type 2 diabetes mellitus without complications: Secondary | ICD-10-CM | POA: Diagnosis not present

## 2015-07-04 DIAGNOSIS — E669 Obesity, unspecified: Secondary | ICD-10-CM | POA: Diagnosis not present

## 2015-07-04 DIAGNOSIS — I5032 Chronic diastolic (congestive) heart failure: Secondary | ICD-10-CM | POA: Insufficient documentation

## 2015-07-04 DIAGNOSIS — I480 Paroxysmal atrial fibrillation: Secondary | ICD-10-CM | POA: Insufficient documentation

## 2015-07-04 DIAGNOSIS — Z87891 Personal history of nicotine dependence: Secondary | ICD-10-CM | POA: Diagnosis not present

## 2015-07-04 DIAGNOSIS — Z6834 Body mass index (BMI) 34.0-34.9, adult: Secondary | ICD-10-CM | POA: Diagnosis not present

## 2015-07-04 DIAGNOSIS — I509 Heart failure, unspecified: Secondary | ICD-10-CM

## 2015-07-04 DIAGNOSIS — Z9981 Dependence on supplemental oxygen: Secondary | ICD-10-CM | POA: Insufficient documentation

## 2015-07-04 DIAGNOSIS — I5022 Chronic systolic (congestive) heart failure: Secondary | ICD-10-CM

## 2015-07-04 HISTORY — PX: CARDIAC CATHETERIZATION: SHX172

## 2015-07-04 LAB — POCT I-STAT 3, VENOUS BLOOD GAS (G3P V)
ACID-BASE EXCESS: 5 mmol/L — AB (ref 0.0–2.0)
ACID-BASE EXCESS: 5 mmol/L — AB (ref 0.0–2.0)
Acid-Base Excess: 3 mmol/L — ABNORMAL HIGH (ref 0.0–2.0)
Acid-Base Excess: 4 mmol/L — ABNORMAL HIGH (ref 0.0–2.0)
Acid-Base Excess: 6 mmol/L — ABNORMAL HIGH (ref 0.0–2.0)
BICARBONATE: 28.1 meq/L — AB (ref 20.0–24.0)
BICARBONATE: 29.6 meq/L — AB (ref 20.0–24.0)
BICARBONATE: 30 meq/L — AB (ref 20.0–24.0)
Bicarbonate: 30.8 mEq/L — ABNORMAL HIGH (ref 20.0–24.0)
Bicarbonate: 31.4 mEq/L — ABNORMAL HIGH (ref 20.0–24.0)
O2 SAT: 68 %
O2 Saturation: 61 %
O2 Saturation: 98 %
O2 Saturation: 65 %
O2 Saturation: 69 %
PCO2 VEN: 51.6 mmHg — AB (ref 45.0–50.0)
PCO2 VEN: 50.9 mmHg — AB (ref 45.0–50.0)
PCO2 VEN: 51.8 mmHg — AB (ref 45.0–50.0)
PH VEN: 7.367 — AB (ref 7.250–7.300)
PH VEN: 7.371 — AB (ref 7.250–7.300)
PO2 VEN: 33 mmHg (ref 30.0–45.0)
PO2 VEN: 37 mmHg (ref 30.0–45.0)
PO2 VEN: 92 mmHg — AB (ref 30.0–45.0)
PO2 VEN: 35 mmHg (ref 30.0–45.0)
PO2 VEN: 37 mmHg (ref 30.0–45.0)
TCO2: 29 mmol/L (ref 0–100)
TCO2: 31 mmol/L (ref 0–100)
TCO2: 32 mmol/L (ref 0–100)
TCO2: 32 mmol/L (ref 0–100)
TCO2: 33 mmol/L (ref 0–100)
pCO2, Ven: 36.7 mmHg — ABNORMAL LOW (ref 45.0–50.0)
pCO2, Ven: 52.2 mmHg — ABNORMAL HIGH (ref 45.0–50.0)
pH, Ven: 7.379 — ABNORMAL HIGH (ref 7.250–7.300)
pH, Ven: 7.491 — ABNORMAL HIGH (ref 7.250–7.300)
pH, Ven: 7.398 — ABNORMAL HIGH (ref 7.250–7.300)

## 2015-07-04 LAB — GLUCOSE, CAPILLARY: GLUCOSE-CAPILLARY: 111 mg/dL — AB (ref 65–99)

## 2015-07-04 SURGERY — RIGHT HEART CATH

## 2015-07-04 MED ORDER — LIDOCAINE HCL (PF) 1 % IJ SOLN
INTRAMUSCULAR | Status: AC
  Filled 2015-07-04: qty 30

## 2015-07-04 MED ORDER — SODIUM CHLORIDE 0.9% FLUSH: 3.0000 mL | Freq: Two times a day (BID) | INTRAVENOUS | Status: DC

## 2015-07-04 MED ORDER — SODIUM CHLORIDE 0.9 % IV SOLN: 250.0000 mL | INTRAVENOUS | Status: DC | PRN

## 2015-07-04 MED ORDER — ACETAMINOPHEN 325 MG PO TABS: 650.0000 mg | ORAL_TABLET | ORAL | Status: DC | PRN

## 2015-07-04 MED ORDER — HEPARIN (PORCINE) IN NACL 2-0.9 UNIT/ML-% IJ SOLN
INTRAMUSCULAR | Status: AC
  Filled 2015-07-04: qty 500

## 2015-07-04 MED ORDER — SODIUM CHLORIDE 0.9% FLUSH: 3.0000 mL | INTRAVENOUS | Status: DC | PRN

## 2015-07-04 MED ORDER — LIDOCAINE HCL (PF) 1 % IJ SOLN
INTRAMUSCULAR | Status: DC | PRN
  Administered 2015-07-04: 5 mL

## 2015-07-04 MED ORDER — SODIUM CHLORIDE 0.9 % IV SOLN
INTRAVENOUS | Status: DC
  Administered 2015-07-04: 08:00:00 via INTRAVENOUS

## 2015-07-04 MED ORDER — HEPARIN (PORCINE) IN NACL 2-0.9 UNIT/ML-% IJ SOLN
INTRAMUSCULAR | Status: DC | PRN
  Administered 2015-07-04: 500 mL

## 2015-07-04 MED ORDER — FENTANYL CITRATE (PF) 100 MCG/2ML IJ SOLN
INTRAMUSCULAR | Status: DC | PRN
  Administered 2015-07-04: 25 ug via INTRAVENOUS

## 2015-07-04 MED ORDER — FENTANYL CITRATE (PF) 100 MCG/2ML IJ SOLN
INTRAMUSCULAR | Status: AC
  Filled 2015-07-04: qty 2

## 2015-07-04 MED ORDER — SODIUM CHLORIDE 0.9 % IV SOLN
250.0000 mL | INTRAVENOUS | Status: DC | PRN
Start: 2015-07-04 — End: 2015-07-04

## 2015-07-04 MED ORDER — ASPIRIN 81 MG PO CHEW: 81.0000 mg | CHEWABLE_TABLET | ORAL | Status: DC

## 2015-07-04 MED ORDER — ONDANSETRON HCL 4 MG/2ML IJ SOLN: 4.0000 mg | Freq: Four times a day (QID) | INTRAMUSCULAR | Status: DC | PRN

## 2015-07-04 SURGICAL SUPPLY — 7 items
CATH BALLN WEDGE 5F 110CM (CATHETERS) ×3 IMPLANT
GUIDEWIRE .025 260CM (WIRE) ×2 IMPLANT
PACK CARDIAC CATHETERIZATION (CUSTOM PROCEDURE TRAY) ×3 IMPLANT
SHEATH FAST CATH BRACH 5F 5CM (SHEATH) ×3 IMPLANT
TRANSDUCER W/STOPCOCK (MISCELLANEOUS) ×6 IMPLANT
TUBING ART PRESS 72  MALE/FEM (TUBING) ×2
TUBING ART PRESS 72 MALE/FEM (TUBING) IMPLANT

## 2015-07-04 NOTE — Discharge Instructions (Signed)
Angiogram, Care After °Refer to this sheet in the next few weeks. These instructions provide you with information about caring for yourself after your procedure. Your health care provider may also give you more specific instructions. Your treatment has been planned according to current medical practices, but problems sometimes occur. Call your health care provider if you have any problems or questions after your procedure. °WHAT TO EXPECT AFTER THE PROCEDURE °After your procedure, it is typical to have the following: °· Bruising at the catheter insertion site that usually fades within 1-2 weeks. °· Blood collecting in the tissue (hematoma) that may be painful to the touch. It should usually decrease in size and tenderness within 1-2 weeks. °HOME CARE INSTRUCTIONS °· Take medicines only as directed by your health care provider. °· You may shower 24-48 hours after the procedure or as directed by your health care provider. Remove the bandage (dressing) and gently wash the site with plain soap and water. Pat the area dry with a clean towel. Do not rub the site, because this may cause bleeding. °· Do not take baths, swim, or use a hot tub until your health care provider approves. °· Check your insertion site every day for redness, swelling, or drainage. °· Do not apply powder or lotion to the site. °· Do not lift over 10 lb (4.5 kg) for 5 days after your procedure or as directed by your health care provider. °· Ask your health care provider when it is okay to: °¨ Return to work or school. °¨ Resume usual physical activities or sports. °¨ Resume sexual activity. °· Do not drive home if you are discharged the same day as the procedure. Have someone else drive you. °· You may drive 24 hours after the procedure unless otherwise instructed by your health care provider. °· Do not operate machinery or power tools for 24 hours after the procedure or as directed by your health care provider. °· If your procedure was done as an  outpatient procedure, which means that you went home the same day as your procedure, a responsible adult should be with you for the first 24 hours after you arrive home. °· Keep all follow-up visits as directed by your health care provider. This is important. °SEEK MEDICAL CARE IF: °· You have a fever. °· You have chills. °· You have increased bleeding from the catheter insertion site. Hold pressure on the site and call the office. °SEEK IMMEDIATE MEDICAL CARE IF: °· You have unusual pain at the catheter insertion site. °· You have redness, warmth, or swelling at the catheter insertion site. °· You have drainage (other than a small amount of blood on the dressing) from the catheter insertion site. °· The catheter insertion site is bleeding, and the bleeding does not stop after 30 minutes of holding steady pressure on the site. °· The area near or just beyond the catheter insertion site becomes pale, cool, tingly, or numb. °  °This information is not intended to replace advice given to you by your health care provider. Make sure you discuss any questions you have with your health care provider. °  °Document Released: 12/05/2004 Document Revised: 06/09/2014 Document Reviewed: 10/20/2012 °Elsevier Interactive Patient Education ©2016 [employer] ° °

## 2015-07-04 NOTE — Interval H&P Note (Signed)
History and Physical Interval Note:  07/04/2015 10:08 AM  Destiny Shelton  has presented today for surgery, with the diagnosis of hf  The various methods of treatment have been discussed with the patient and family. After consideration of risks, benefits and other options for treatment, the patient has consented to  Procedure(s): Right Heart Cath (N/A) as a surgical intervention .  The patient's history has been reviewed, patient examined, no change in status, stable for surgery.  I have reviewed the patient's chart and labs.  Questions were answered to the patient's satisfaction.     Shahidah Nesbitt [employer]

## 2015-07-04 NOTE — H&P (View-Only) (Signed)
Patient ID: Destiny Shelton, female   DOB: 02-Aug-1945, 70 y.o.   MRN: [id number] PCP: Dr. Brigitte Pulse Cardiology: Dr Ellyn Hack HF Cardiology: Dr. Aundra Dubin  70 yo with history of paroxysmal atrial fibrillation, chronic diastolic CHF with restrictive hemodynamics on 2013 RHC, COPD on home oxygen, inappropriate sinus tachycardia, and concern for intracardiac shunting presents for CHF clinic evaluation.  She has been followed for several years by both cardiology and pulmonology.  She is a prior smoker and has been diagnosed with COPD.  Interestingly, her PFTs in 10/16 showed improvement, suggesting mild obstructive airways disease.  However, she still requires home oxygen.  She had transcranial dopplers in 2013 suggesting a medium-sized right to left shunt.  This led to an extensive workup.  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  Cardiac MRI was unremarkable, no evidence for shunt lesion.  RHC/LHC showed no coronary disease but it did show restrictive hemodynamics and volume overload.  Qp/Qs from this study was 1.27/1, suggesting a relatively small left to right shunt.  A definite shunt lesion was never identified.    She remains in NSR most of the time.  When she is in atrial fibrillation, she gets more short of breath.  Recently, she has been getting more short of breath generally.  She uses 4L oxygen at rest and 6L with exertion.  She is short of breath now with even short walks like from her bed to the bathroom.  She had to stop twice today walking in from the parking garage.  Rare lightheadedness, no syncope.  She sleeps in a recliner due to orthopnea.  She has done this for a while.  No chest pain.  She has back and knee pain.  No melena/BRBPR on Xarelto.    Labs (9/16): digoxin 0.6, K 4.3, creatinine 0.85  ECG: sinus tachycardia at 112 bpm  PMH: 1. Atrial fibrillation: Paroxysmal.  2. HTN 3. COPD: Home oxygen.  PFTs (10/16) with FVC 77%, FEV1 68%, TLC 89% => mild obstruction (improved  from the past).   4. Type II diabetes 5. Obesity 6. Inappropriate sinus tachycardia 7. OSA: Does not use CPAP, uses oxygen at night.  8. Chronic diastolic CHF: RHC/LHC in 0000000 with no significant CAD, mean RA 19, RV 46/18, PA 45/29 mean 37, mean PCWP 22, CI 4.1, PVR 1.4 WU => restrictive hemodynamics with no evidence for constriction;  Qp/Qs 1.27/1; saturation run difficult to interpret.  TEE (10/13): EF 55-60%, LVH, mild MR, bubble study showed no evidence for immediate R=>L bubble crossing, small amount of bubbles ended up in left heart > 5 seconds after injection suggesting possible intrapulmonary shunting; no shunt by color doppler.  Cardiac MRI (8/13) with EF 59%, no LGE, normal RV size/systolic function => interpreted as normal.  9. ?Intracardiac shunt: Transcranial dopplers in 10/13 were suggestive of medium-sized right to left shunt.  Bubble study on TEE in 10/13 showed no evidence for immediate R=>L bubble crossing, small amount of bubbles ended up in left heart > 5 seconds after injection suggesting possible intrapulmonary shunting; no shunt by color doppler. Cardiac MRI showed no evidence for shunting.  Cardiac cath in 2013 actually was suggestive of a small left to right shunt.    SH: Married, prior heavy smoker (quit 1996).  No ETOH.   Family History  Problem Relation Age of Onset  . Heart disease Mother   . Stroke Mother   . Heart disease Father   . Heart disease Brother   . Stroke Brother   .  Skin cancer Brother   . Colon cancer Neg Hx   . Colon polyps Sister   . Diabetes Sister   . Diabetes Brother   . Irritable bowel syndrome Daughter    ROS: All systems reviewed and negative except as per HPI.   Current Outpatient Prescriptions  Medication Sig Dispense Refill  . acetaminophen (TYLENOL) 500 MG tablet Take per bottle as needed for pain    . albuterol (PROVENTIL) (2.5 MG/3ML) 0.083% nebulizer solution Take 3 mLs (2.5 mg total) by nebulization every 6 (six) hours as needed  for wheezing or shortness of breath. (Patient taking differently: Take 2.5 mg by nebulization every 4 (four) hours as needed for wheezing or shortness of breath. ) 75 mL 3  . ALPRAZolam (XANAX) 0.5 MG tablet Take 1/2 tablet by mouth daily as needed for anxiety    . AMBULATORY NON FORMULARY MEDICATION Continuous O2 @4LMP     . atorvastatin (LIPITOR) 40 MG tablet Take 1 tablet by mouth at bedtime.     . benzonatate (TESSALON) 200 MG capsule Take 400 mg by mouth every 8 (eight) hours.     . budesonide-formoterol (SYMBICORT) 160-4.5 MCG/ACT inhaler Inhale 2 puffs into the lungs 2 (two) times daily.    Marland Kitchen buPROPion (WELLBUTRIN XL) 150 MG 24 hr tablet Take 300 mg by mouth every morning.     Marland Kitchen dextromethorphan-guaiFENesin (MUCINEX DM) 30-600 MG 12hr tablet Take 1 tablet by mouth 2 (two) times daily.    . digoxin (LANOXIN) 0.125 MG tablet Take 1 tablet (0.125 mg total) by mouth daily. 30 tablet 11  . DULoxetine (CYMBALTA) 60 MG capsule Take 60 mg by mouth every morning.     . furosemide (LASIX) 40 MG tablet Take 60 mg by mouth 2 (two) times daily.    Marland Kitchen gabapentin (NEURONTIN) 600 MG tablet Take 600 mg by mouth 3 (three) times daily.     Marland Kitchen HYDROcodone-acetaminophen (NORCO/VICODIN) 5-325 MG per tablet Take 1 tablet by mouth every 4 (four) hours as needed for moderate pain.     Marland Kitchen insulin aspart (NOVOLOG) 100 UNIT/ML injection Inject 8 Units into the skin 2 (two) times daily.     . insulin glargine (LANTUS) 100 UNIT/ML injection Inject 70 Units into the skin daily before breakfast. 10 mL   . irbesartan (AVAPRO) 150 MG tablet Take 1 tablet (150 mg total) by mouth daily. (Patient taking differently: Take 150 mg by mouth every morning. ) 30 tablet 3  . iron polysaccharides (NIFEREX) 150 MG capsule Take 150 mg by mouth daily.    Marland Kitchen levalbuterol (XOPENEX HFA) 45 MCG/ACT inhaler Inhale 1-2 puffs into the lungs every 4 (four) hours as needed for wheezing. (Patient taking differently: Inhale 1-2 puffs into the lungs every  4 (four) hours as needed for wheezing or shortness of breath. ) 1 Inhaler 12  . Multiple Vitamin (MULTIVITAMIN) tablet Take 1 tablet by mouth every morning.     Marland Kitchen omeprazole (PRILOSEC) 20 MG capsule Take 20 mg by mouth daily before breakfast.     . ONE TOUCH ULTRA TEST test strip 1 each by Other route 3 (three) times daily.   4  . OXYGEN Increase oxygen to 6L with acitivity    . polyethylene glycol (MIRALAX / GLYCOLAX) packet Take 17 g by mouth daily.    . potassium chloride (KLOR-CON) 10 MEQ CR tablet Take 20 mEq by mouth 2 (two) times daily.    . rivaroxaban (XARELTO) 20 MG TABS tablet Take 1 tablet (20 mg total) by  mouth daily with supper. Restart on Wednesday May 11 , 2016 30 tablet   . tiotropium (SPIRIVA) 18 MCG inhalation capsule Place 18 mcg into inhaler and inhale every morning.    . verapamil (VERELAN PM) 360 MG 24 hr capsule TAKE ONE CAPSULE BY MOUTH DAILY (Patient taking differently: TAKE ONE CAPSULE BY MOUTH EVERY MORNING) 30 capsule 1  . docusate sodium (COLACE) 100 MG capsule Take 100 mg by mouth every morning.     . ivabradine (CORLANOR) 5 MG TABS tablet Take 1 tablet (5 mg total) by mouth 2 (two) times daily with a meal. 60 tablet 3   No current facility-administered medications for this encounter.   BP 120/60 mmHg  Pulse 115  Wt 215 lb 12 oz (97.864 kg)  SpO2 92% General: NAD, obese, wearing oxygen.  Neck: JVP 10-12 cm, no thyromegaly or thyroid nodule.  Lungs: Clear to auscultation bilaterally with normal respiratory effort. CV: Nondisplaced PMI.  Heart tachy, regular S1/S2, no S3/S4, 2/6 HSM LLSB.  Trace edema right ankle.  No carotid bruit.  Normal pedal pulses.  Abdomen: Soft, nontender, no hepatosplenomegaly, no distention.  Skin: Intact without lesions or rashes.  Neurologic: Alert and oriented x 3.  Psych: Normal affect. Extremities: No clubbing or cyanosis.  HEENT: Normal.   Assessment/Plan: 1. COPD: On home oxygen, 4L at rest and 6L with exertion.  Her  dyspnea and hypoxemia do indeed seem somewhat out of proportion to her degree of COPD.  PFTs in 10/16 actually showed only mild obstruction (improved).  2. Atrial fibrillation: Paroxysmal, CHADSVASC = 4.  She remains in NSR today and has not felt atrial fibrillation recently.   - She will continue Xarelto.  - She is on verapamil and digoxin for rate control when she is in atrial fibrillation.  I will be getting an echo => if LV EF is normal and there is not significant RV dysfunction, would probably stop digoxin.   3. Chronic diastolic CHF: Restrictive hemodynamics from 2013 catheterization with normal EF by 2013 TEE and cardiac MRI.  NYHA class IIIb symptoms.  She is volume overloaded on exam.   - Needs repeat echo.  - She also needs repeat RHC to reassess filling pressures and to look for development of pulmonary arterial hypertension. We discussed risks/benefits of the procedure and she agrees to proceed.  - Increase Lasix to 60 mg bid with KCl 20 bid.  BMET/BNP today.  4. ?Shunt lesion: She had transcranial dopplers in 2013 suggesting a medium-sized right to left shunt.  This led to an extensive workup.  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  Cardiac MRI was unremarkable, no evidence for shunt lesion.  RHC/LHC showed no coronary disease but it did show restrictive hemodynamics and volume overload.  Qp/Qs from this study was 1.27/1, suggesting a relatively small left to right shunt.  A definite shunt lesion was never identified.  Based on prior workup, it seems most likely that the right to left shunting by transcranial dopplers and TEE bubble study was due to pulmonary AVMs.   - On RHC, I will do a shunt run to calculate Qp/Qs.   5. Inappropriate sinus tachycardia: HR 110s-120s at rest.  I will have her try Corlanor 5 mg bid to see if this helps her symptomatically.   Followup 2 wks after cath.   Loralie Champagne 06/28/2015

## 2015-07-17 ENCOUNTER — Other Ambulatory Visit: Payer: Self-pay

## 2015-07-17 ENCOUNTER — Ambulatory Visit (HOSPITAL_COMMUNITY): Payer: Medicare Other | Attending: Cardiology

## 2015-07-17 DIAGNOSIS — E119 Type 2 diabetes mellitus without complications: Secondary | ICD-10-CM | POA: Diagnosis not present

## 2015-07-17 DIAGNOSIS — I517 Cardiomegaly: Secondary | ICD-10-CM | POA: Diagnosis not present

## 2015-07-17 DIAGNOSIS — Z87891 Personal history of nicotine dependence: Secondary | ICD-10-CM | POA: Insufficient documentation

## 2015-07-17 DIAGNOSIS — I5032 Chronic diastolic (congestive) heart failure: Secondary | ICD-10-CM

## 2015-07-17 DIAGNOSIS — Z6834 Body mass index (BMI) 34.0-34.9, adult: Secondary | ICD-10-CM | POA: Diagnosis not present

## 2015-07-17 DIAGNOSIS — I10 Essential (primary) hypertension: Secondary | ICD-10-CM | POA: Insufficient documentation

## 2015-07-17 DIAGNOSIS — I5189 Other ill-defined heart diseases: Secondary | ICD-10-CM | POA: Diagnosis not present

## 2015-07-17 DIAGNOSIS — I340 Nonrheumatic mitral (valve) insufficiency: Secondary | ICD-10-CM | POA: Insufficient documentation

## 2015-07-17 DIAGNOSIS — I272 Other secondary pulmonary hypertension: Secondary | ICD-10-CM | POA: Insufficient documentation

## 2015-07-17 DIAGNOSIS — I509 Heart failure, unspecified: Secondary | ICD-10-CM | POA: Diagnosis present

## 2015-07-20 ENCOUNTER — Ambulatory Visit (HOSPITAL_COMMUNITY)
Admission: RE | Admit: 2015-07-20 | Discharge: 2015-07-20 | Disposition: A | Discharge status: Home / Self Care | Payer: Medicare Other | Source: Ambulatory Visit | Attending: Cardiology | Admitting: Cardiology

## 2015-07-20 ENCOUNTER — Encounter (HOSPITAL_COMMUNITY): Payer: Self-pay

## 2015-07-20 VITALS — BP 120/62 | HR 96 | Wt 211.2 lb

## 2015-07-20 DIAGNOSIS — R000 Tachycardia, unspecified: Secondary | ICD-10-CM | POA: Diagnosis not present

## 2015-07-20 DIAGNOSIS — I272 Other secondary pulmonary hypertension: Secondary | ICD-10-CM | POA: Insufficient documentation

## 2015-07-20 DIAGNOSIS — J449 Chronic obstructive pulmonary disease, unspecified: Secondary | ICD-10-CM | POA: Diagnosis not present

## 2015-07-20 DIAGNOSIS — I5032 Chronic diastolic (congestive) heart failure: Secondary | ICD-10-CM | POA: Diagnosis not present

## 2015-07-20 DIAGNOSIS — Z794 Long term (current) use of insulin: Secondary | ICD-10-CM | POA: Diagnosis not present

## 2015-07-20 DIAGNOSIS — Z833 Family history of diabetes mellitus: Secondary | ICD-10-CM | POA: Insufficient documentation

## 2015-07-20 DIAGNOSIS — Z87891 Personal history of nicotine dependence: Secondary | ICD-10-CM | POA: Diagnosis not present

## 2015-07-20 DIAGNOSIS — I480 Paroxysmal atrial fibrillation: Secondary | ICD-10-CM | POA: Insufficient documentation

## 2015-07-20 DIAGNOSIS — Z9981 Dependence on supplemental oxygen: Secondary | ICD-10-CM | POA: Diagnosis not present

## 2015-07-20 DIAGNOSIS — I110 Hypertensive heart disease with heart failure: Secondary | ICD-10-CM | POA: Insufficient documentation

## 2015-07-20 DIAGNOSIS — E669 Obesity, unspecified: Secondary | ICD-10-CM | POA: Insufficient documentation

## 2015-07-20 DIAGNOSIS — Z8249 Family history of ischemic heart disease and other diseases of the circulatory system: Secondary | ICD-10-CM | POA: Insufficient documentation

## 2015-07-20 DIAGNOSIS — Z79899 Other long term (current) drug therapy: Secondary | ICD-10-CM | POA: Insufficient documentation

## 2015-07-20 DIAGNOSIS — Z823 Family history of stroke: Secondary | ICD-10-CM | POA: Diagnosis not present

## 2015-07-20 DIAGNOSIS — E119 Type 2 diabetes mellitus without complications: Secondary | ICD-10-CM | POA: Diagnosis not present

## 2015-07-20 DIAGNOSIS — G4733 Obstructive sleep apnea (adult) (pediatric): Secondary | ICD-10-CM | POA: Diagnosis not present

## 2015-07-20 DIAGNOSIS — Z7902 Long term (current) use of antithrombotics/antiplatelets: Secondary | ICD-10-CM | POA: Insufficient documentation

## 2015-07-20 LAB — BASIC METABOLIC PANEL
ANION GAP: 13 (ref 5–15)
BUN: 14 mg/dL (ref 6–20)
CHLORIDE: 101 mmol/L (ref 101–111)
CO2: 25 mmol/L (ref 22–32)
Calcium: 9.4 mg/dL (ref 8.9–10.3)
Creatinine, Ser: 0.99 mg/dL (ref 0.44–1.00)
GFR calc Af Amer: >60 mL/min (ref >60)
GFR, EST NON AFRICAN AMERICAN: 56 mL/min — AB (ref >60)
Glucose, Bld: 142 mg/dL — ABNORMAL HIGH (ref 65–99)
POTASSIUM: 4.3 mmol/L (ref 3.5–5.1)
SODIUM: 139 mmol/L (ref 135–145)

## 2015-07-20 LAB — BRAIN NATRIURETIC PEPTIDE: B NATRIURETIC PEPTIDE 5: 26.8 pg/mL (ref 0.0–100.0)

## 2015-07-20 NOTE — Patient Instructions (Signed)
STOP Digoxin.  Routine lab work today. Will notify you of abnormal results, otherwise no news is good news!  Follow up 1 month with Dr. Aundra Dubin.  Do the following things EVERYDAY: 1) Weigh yourself in the morning before breakfast. Write it down and keep it in a log. 2) Take your medicines as prescribed 3) Eat low salt foods-Limit salt (sodium) to 2000 mg per day.  4) Stay as active as you can everyday 5) Limit all fluids for the day to less than 2 liters

## 2015-07-22 NOTE — Progress Notes (Signed)
Patient ID: Destiny Shelton, female   DOB: 07-Mar-1946, 70 y.o.   MRN: [id number]  PCP: Dr. Brigitte Pulse Cardiology: Dr Ellyn Hack HF Cardiology: Dr. Aundra Dubin  70 yo with history of paroxysmal atrial fibrillation, chronic diastolic CHF with restrictive hemodynamics on 2013 RHC, COPD on home oxygen, inappropriate sinus tachycardia, and concern for intracardiac shunting presents for CHF clinic evaluation.  She has been followed for several years by both cardiology and pulmonology.  She is a prior smoker and has been diagnosed with COPD.  Interestingly, her PFTs in 10/16 showed improvement, suggesting mild obstructive airways disease.  However, she still requires home oxygen.  She had transcranial dopplers in 2013 suggesting a medium-sized right to left shunt.  This led to an extensive workup.  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  Cardiac MRI was unremarkable, no evidence for shunt lesion.  RHC/LHC showed no coronary disease but it did show restrictive hemodynamics and volume overload.  Qp/Qs from this study was 1.27/1, suggesting a relatively small left to right shunt.  A definite shunt lesion was never identified.    When I saw her at last appointment, she reported significant exertional dyspnea and looked volume overloaded on exam.  I increased her Lasix.  I did an echo, showing normal LV size and systolic function and normal RV.  I also did a RHC: on higher dose diuretics, she had mild pulmonary hypertension and normal PCWP.  There was no evidence for a shunt lesion with Qp/Qs 0.97.   She returns for HF followup. She feels better on increased Lasix.  Walking with a walker, short of breath after about 100 yards.  No palpitations but HR still tends to run high at home, > 100 bpm even at rest.  It is 96 bpm today.  No chest pain.  No wheezing.  Weight is down 4 lbs.    Labs (9/16): digoxin 0.6, K 4.3, creatinine 0.85 Labs (1/17): K 3.9, creatinine 0.96, hgb 8.9  PMH: 1. Atrial fibrillation:  Paroxysmal.  2. HTN 3. COPD: Home oxygen.  PFTs (10/16) with FVC 77%, FEV1 68%, TLC 89% => mild obstruction (improved from the past).   4. Type II diabetes 5. Obesity 6. Inappropriate sinus tachycardia 7. OSA: Does not use CPAP, uses oxygen at night.  8. Chronic diastolic CHF: RHC/LHC in 0000000 with no significant CAD, mean RA 19, RV 46/18, PA 45/29 mean 37, mean PCWP 22, CI 4.1, PVR 1.4 WU => restrictive hemodynamics with no evidence for constriction;  Qp/Qs 1.27/1; saturation run difficult to interpret.  TEE (10/13): EF 55-60%, LVH, mild MR, bubble study showed no evidence for immediate R=>L bubble crossing, small amount of bubbles ended up in left heart > 5 seconds after injection suggesting possible intrapulmonary shunting; no shunt by color doppler.  Cardiac MRI (8/13) with EF 59%, no LGE, normal RV size/systolic function => interpreted as normal.  RHC 07/04/2015: RA mean 9, RV 45/10, PA 41/20 mean 30, PCWP mean 13, no step up on shunt run,cardiac output (Fick) 6.7 cardiac index (Fick) 3.3, PVR 2.5 WU, Qp/Qs = 0.97. Echo (2/17) with EF 55-60%, mild LVH, mild MR, normal RV size and systolic function.  9. ?Intracardiac shunt: Transcranial dopplers in 10/13 were suggestive of medium-sized right to left shunt.  Bubble study on TEE in 10/13 showed no evidence for immediate R=>L bubble crossing, small amount of bubbles ended up in left heart > 5 seconds after injection suggesting possible intrapulmonary shunting; no shunt by color doppler. Cardiac MRI showed  no evidence for shunting.  Cardiac cath in 2013 actually was suggestive of a small left to right shunt.  Cardiac cath in 2/17 NOT suggestive of significant shunt, Qp/Qs 0.97.   SH: Married, prior heavy smoker (quit 1996).  No ETOH.   Family History  Problem Relation Age of Onset  . Heart disease Mother   . Stroke Mother   . Heart disease Father   . Heart disease Brother   . Stroke Brother   . Skin cancer Brother   . Colon cancer Neg Hx   . Colon  polyps Sister   . Diabetes Sister   . Diabetes Brother   . Irritable bowel syndrome Daughter    ROS: All systems reviewed and negative except as per HPI.   Current Outpatient Prescriptions  Medication Sig Dispense Refill  . acetaminophen (TYLENOL) 500 MG tablet Take per bottle as needed for pain    . albuterol (PROVENTIL) (2.5 MG/3ML) 0.083% nebulizer solution Take 3 mLs (2.5 mg total) by nebulization every 6 (six) hours as needed for wheezing or shortness of breath. (Patient taking differently: Take 2.5 mg by nebulization every 4 (four) hours as needed for wheezing or shortness of breath. ) 75 mL 3  . ALPRAZolam (XANAX) 0.5 MG tablet Take 1/2 tablet by mouth daily as needed for anxiety    . AMBULATORY NON FORMULARY MEDICATION Continuous O2 @4LMP     . atorvastatin (LIPITOR) 40 MG tablet Take 1 tablet by mouth at bedtime.     . benzonatate (TESSALON) 200 MG capsule Take 400 mg by mouth every 8 (eight) hours.     . budesonide-formoterol (SYMBICORT) 160-4.5 MCG/ACT inhaler Inhale 2 puffs into the lungs 2 (two) times daily.    Marland Kitchen buPROPion (WELLBUTRIN XL) 150 MG 24 hr tablet Take 300 mg by mouth every morning.     . docusate sodium (COLACE) 100 MG capsule Take 100 mg by mouth every morning.     . DULoxetine (CYMBALTA) 60 MG capsule Take 60 mg by mouth every morning.     . furosemide (LASIX) 40 MG tablet Take 60 mg by mouth 2 (two) times daily.    Marland Kitchen gabapentin (NEURONTIN) 600 MG tablet Take 600 mg by mouth 3 (three) times daily.     Marland Kitchen HYDROcodone-acetaminophen (NORCO/VICODIN) 5-325 MG per tablet Take 1 tablet by mouth every 4 (four) hours as needed for moderate pain.     Marland Kitchen insulin aspart (NOVOLOG) 100 UNIT/ML injection Inject 8 Units into the skin 2 (two) times daily.     . insulin glargine (LANTUS) 100 UNIT/ML injection Inject 70 Units into the skin daily before breakfast. 10 mL   . irbesartan (AVAPRO) 150 MG tablet Take 1 tablet (150 mg total) by mouth daily. (Patient taking differently: Take 150  mg by mouth every morning. ) 30 tablet 3  . iron polysaccharides (NIFEREX) 150 MG capsule Take 150 mg by mouth daily.    Marland Kitchen levalbuterol (XOPENEX HFA) 45 MCG/ACT inhaler Inhale 1-2 puffs into the lungs every 4 (four) hours as needed for wheezing. (Patient taking differently: Inhale 1-2 puffs into the lungs every 4 (four) hours as needed for wheezing or shortness of breath. ) 1 Inhaler 12  . Multiple Vitamin (MULTIVITAMIN) tablet Take 1 tablet by mouth every morning.     Marland Kitchen omeprazole (PRILOSEC) 20 MG capsule Take 20 mg by mouth daily before breakfast.     . ONE TOUCH ULTRA TEST test strip 1 each by Other route 3 (three) times daily.   4  .  OXYGEN Increase oxygen to 6L with acitivity    . polyethylene glycol (MIRALAX / GLYCOLAX) packet Take 17 g by mouth daily.    . potassium chloride (KLOR-CON) 10 MEQ CR tablet Take 20 mEq by mouth 2 (two) times daily.    . rivaroxaban (XARELTO) 20 MG TABS tablet Take 1 tablet (20 mg total) by mouth daily with supper. Restart on Wednesday May 11 , 2016 30 tablet   . tiotropium (SPIRIVA) 18 MCG inhalation capsule Place 18 mcg into inhaler and inhale every morning.    . verapamil (VERELAN PM) 360 MG 24 hr capsule TAKE ONE CAPSULE BY MOUTH DAILY 30 capsule 5   No current facility-administered medications for this encounter.   BP 120/62 mmHg  Pulse 96  Wt 211 lb 4 oz (95.822 kg)  SpO2 94% General: NAD, obese, wearing oxygen.  Neck: JVP 7-8 cm, no thyromegaly or thyroid nodule.  Lungs: Clear to auscultation bilaterally with normal respiratory effort. CV: Nondisplaced PMI.  Heart tachy, regular S1/S2, no S3/S4, 2/6 HSM LLSB.  No edema.  No carotid bruit.  Normal pedal pulses.  Abdomen: Soft, nontender, no hepatosplenomegaly, no distention.  Skin: Intact without lesions or rashes.  Neurologic: Alert and oriented x 3.  Psych: Normal affect. Extremities: No clubbing or cyanosis.  HEENT: Normal.   Assessment/Plan: 1. COPD: On home oxygen, 4L at rest and 6L with  exertion.  PFTs in 10/16 actually showed only mild obstruction (improved).  Breathing is better with diuresis, suggesting that CHF plays a significant role in her symptoms in addition to COPD.  2. Atrial fibrillation: Paroxysmal, CHADSVASC = 4.  She remains in NSR today and has not felt atrial fibrillation recently.   - She will continue Xarelto.  - She can continue verapamil but will stop digoxin given normal EF.     3. Chronic diastolic CHF: At last appointment, I increased her Lasix.  Weight is down and breathing better, remains NYHA class III, as I suspect that COPD plays a role in the dyspnea as well.  She does not look volume overloaded on exam. - Continue Lasix 60 mg bid.  BMET/BNP today.  4. ?Shunt lesion: She had transcranial dopplers in 2013 suggesting a medium-sized right to left shunt.  This led to an extensive workup.  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  Cardiac MRI was unremarkable, no evidence for shunt lesion.  RHC/LHC in 2013 showed no coronary disease but it did show restrictive hemodynamics and volume overload.  Qp/Qs from this study was 1.27/1, suggesting a relatively small left to right shunt.  A definite shunt lesion was never identified.  Based on prior workup, it seems most likely that the right to left shunting by transcranial dopplers and TEE bubble study was due to pulmonary AVMs.  RHC repeated in 2/17 did not show a significant shunt lesion, with Qp/Qs 0.97.  5. Inappropriate sinus tachycardia: HR remains > 100 most of the time at rest.  I think that she might be symptomatically better with lower HR.  I had wanted to use Corlanor but this interacts with verapamil.  We are looking to see if she could get coverage for Corlanor.  In that case, could stop verapamil and use Corlanor instead.  6. Pulmonary hypertension: Mild pulmonary hypertension on RHC with PVR only 2.5 WU.  Probably due to COPD and elevated left atrial pressure.  No specific  treatment other than diuresis.   Loralie Champagne 07/22/2015

## 2015-07-23 ENCOUNTER — Other Ambulatory Visit: Payer: Self-pay | Admitting: Cardiology

## 2015-07-23 NOTE — Telephone Encounter (Signed)
Rx request sent to pharmacy.  

## 2015-08-01 ENCOUNTER — Encounter: Payer: Self-pay | Admitting: Internal Medicine

## 2015-08-10 ENCOUNTER — Ambulatory Visit (INDEPENDENT_AMBULATORY_CARE_PROVIDER_SITE_OTHER): Payer: Medicare Other | Admitting: Pulmonary Disease

## 2015-08-10 ENCOUNTER — Encounter: Payer: Self-pay | Admitting: Pulmonary Disease

## 2015-08-10 VITALS — BP 126/62 | HR 106 | Ht 65.0 in | Wt 213.0 lb

## 2015-08-10 DIAGNOSIS — R0602 Shortness of breath: Secondary | ICD-10-CM | POA: Diagnosis not present

## 2015-08-10 DIAGNOSIS — I272 Other secondary pulmonary hypertension: Secondary | ICD-10-CM | POA: Diagnosis not present

## 2015-08-10 DIAGNOSIS — J9611 Chronic respiratory failure with hypoxia: Secondary | ICD-10-CM | POA: Diagnosis not present

## 2015-08-10 DIAGNOSIS — J449 Chronic obstructive pulmonary disease, unspecified: Secondary | ICD-10-CM

## 2015-08-10 MED ORDER — TIOTROPIUM BROMIDE MONOHYDRATE 18 MCG IN CAPS: 18.0000 ug | ORAL_CAPSULE | RESPIRATORY_TRACT | Status: DC

## 2015-08-10 NOTE — Progress Notes (Signed)
Subjective:    Patient ID: Destiny Shelton, female    DOB: 06-28-1945, 70 y.o.   MRN: [id number]  Synopsys: Former patient of Dr. Gwenette Greet with pulmonary hypertension, Afib, and COPD.  She smoked 2 packs per day for 25 years and quit in the 1990's.  She has been on oxygen since 2012.  As of 2017 She has been using 5L O2 at rest and 6 L with exertion.   She does not have a history of PE that she is aware of.   pfts 2011:  FEV1 1.34 (55%), ratio 68, ++airtrapping on lung volumes, nl TLC, DLCO 40% pred. PFT's  03/23/2015  FEV1 1.61 (68 % ) ratio 69  p no % improvement from saba on   Symb/spiriva  Echo 2012: nl LV and EF, impaired relaxation, nothing to suggest pulm htn. Myoview 2012: no ischemia LHC 12/2011: non-obstructive cad RHC 12/2011:  PA 45/29 with mean 88mm, PCWP 22, CO Fick 10.25, PVR 1.17 wood units, slight step up in saturation from   RV to PA [place] 12/2011:  Equal 4 chamber pressures, but no square root sign.   Echo [id number]:  Nl LV, diastolic dysfxn, normal RV, estimated RVSP 46.  TEE 03/2012:  A few late bubbles seen felt c/w small IP shunting.  CT chest 12/2011:  No PE, very mild mosaic perfusion abnl (prob secondary to her known airflow obstruction).  Cardiac MRI 2013:  No infiltrative process.  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  07/04/2015 RHC: RA mean 9 RV 45/10 PA 41/20, mean 30 PCWP mean 13  Oxygen saturations: SVC 68% RA 65% RV 61% PA 69% LA 98% Peripheral sat 93%  Cardiac Output (Fick) 6.7  Cardiac Index (Fick) 3.3 PVR 2.5 WU Qp/Qs = 0.97    HPI Chief Complaint  Patient presents with  . Advice Only    switching from MW to BQ- being treated for COPD, pulm htn.  pt has no current breathing complaints.      Changing from [place] to [place] that she takes the Symbicort and Spiriva religiously, never forgets.  Some days she doesn't need the rescue Xopenex.   She says that her breathing has been better recently since Dr. Aundra Dubin  increased her lasix to 60mg  twice a day.  She says that she is now breathing a lot better.  She has been having some coughing spells recently but this has improved as well.  She says that she still has a coughing spell from time to time, but these have improved.  She has noteiced that since the fluid has left her lungs her breathing has improved.  She still produces green phlegm 2-3 times a day, better than before.  She still has some leg swelling this is typically worse when she is on her feet more.    She weighs herself daily.  Her goal weight is 207 lbs or below.  She says most of the time she is around 210 and was the value this morning.  She does not exercise regularly but she has been able to walk a little further with her husband without too much difficulty.  Yesterday she walked thorugh the whole grocery store wihtout stopping.   She has not had to go to the doctor often for bronchitis or pneumonia.  She had a UTI in January, but no recent bronchitis.   Review of Systems  Constitutional: Negative for fever, chills, diaphoresis and fatigue.  HENT: Negative  for postnasal drip, rhinorrhea and sinus pressure.   Respiratory: Positive for cough, shortness of breath and wheezing.   Cardiovascular: Positive for leg swelling. Negative for chest pain and palpitations.       Objective:   Physical Exam Filed Vitals:   08/10/15 1427  BP: 126/62  Pulse: 106  Height: 5\' 5"  (1.651 m)  Weight: 213 lb (96.616 kg)  SpO2: 91%   5L [place]  Gen: chronically ill appearing HENT: OP clear, TM's clear, neck supple PULM: few fine crackles bases B, normal percussion CV: RRR,slight systolic murmur LUSB, trace edema GI: BS+, soft, nontender Derm: no cyanosis or rash Psyche: normal mood and affect  Cardiology records reviewed where her Lasix dosing was adjusted recently Chest x-ray images personally reviewed showing emphysema and interstitial edema in the bases bilaterally     Assessment & Plan:    CHRONIC DYSPNEA This has improved significantly recently with the addition of the new dose of Lasix. Clearly, this is a multifactorial problem is she is profoundly deconditioned. I encouraged her at length today to start a regular exercise routine.  Plan: Start exercising Continue current diuretic regimen as prescribed by Dr. Marigene Ehlers  Pulmonary hypertension [facility]) The results of the most recent right heart catheterization were reviewed which surprisingly showed a normal wedge pressure and basically unchanged pulmonary hypertension compared to the 2013 study. I agree that there is really no role for pulmonary vasodilators considering her significant COPD.  Plan: Continue Lasix dosing as prescribed  COPD GOLD II  Results of prior pulmonary function testing reviewed. She does have moderate airflow obstruction but severe symptoms which make her a gold grade D. As above, I have strongly encouraged exercise. She previously failed pulmonary rehabilitation, that may be of some value now, but I want her to start exercising at home first and see how that goes.  Plan: Continue bronchodilators Start exercise as detailed above  Chronic respiratory failure (HCC) Continue oxygen as prescribed Walk for O2 titration next visit.     Current outpatient prescriptions:  .  acetaminophen (TYLENOL) 500 MG tablet, Take per bottle as needed for pain, Disp: , Rfl:  .  albuterol (PROVENTIL) (2.5 MG/3ML) 0.083% nebulizer solution, Take 3 mLs (2.5 mg total) by nebulization every 6 (six) hours as needed for wheezing or shortness of breath. (Patient taking differently: Take 2.5 mg by nebulization every 4 (four) hours as needed for wheezing or shortness of breath. ), Disp: 75 mL, Rfl: 3 .  ALPRAZolam (XANAX) 0.5 MG tablet, Take 1/2 tablet by mouth daily as needed for anxiety, Disp: , Rfl:  .  AMBULATORY NON FORMULARY MEDICATION, Continuous O2 @4LMP , Disp: , Rfl:  .  atorvastatin (LIPITOR) 40 MG tablet, Take 1  tablet by mouth at bedtime. , Disp: , Rfl:  .  benzonatate (TESSALON) 200 MG capsule, Take 400 mg by mouth every 8 (eight) hours. , Disp: , Rfl:  .  budesonide-formoterol (SYMBICORT) 160-4.5 MCG/ACT inhaler, Inhale 2 puffs into the lungs 2 (two) times daily., Disp: , Rfl:  .  buPROPion (WELLBUTRIN XL) 150 MG 24 hr tablet, Take 300 mg by mouth every morning. , Disp: , Rfl:  .  docusate sodium (COLACE) 100 MG capsule, Take 100 mg by mouth every morning. , Disp: , Rfl:  .  DULoxetine (CYMBALTA) 60 MG capsule, Take 60 mg by mouth every morning. , Disp: , Rfl:  .  furosemide (LASIX) 40 MG tablet, Take 60 mg by mouth 2 (two) times daily., Disp: , Rfl:  .  gabapentin (NEURONTIN) 600 MG tablet, Take 600 mg by mouth 3 (three) times daily. , Disp: , Rfl:  .  HYDROcodone-acetaminophen (NORCO/VICODIN) 5-325 MG per tablet, Take 1 tablet by mouth every 4 (four) hours as needed for moderate pain. , Disp: , Rfl:  .  insulin glargine (LANTUS) 100 UNIT/ML injection, Inject 70 Units into the skin daily before breakfast., Disp: 10 mL, Rfl:  .  insulin lispro (HUMALOG) 100 UNIT/ML injection, Inject 8 Units into the skin 2 (two) times daily., Disp: , Rfl:  .  irbesartan (AVAPRO) 150 MG tablet, TAKE 1 TABLET(150 MG) BY MOUTH DAILY, Disp: 30 tablet, Rfl: 3 .  iron polysaccharides (NIFEREX) 150 MG capsule, Take 150 mg by mouth daily., Disp: , Rfl:  .  levalbuterol (XOPENEX HFA) 45 MCG/ACT inhaler, Inhale 1-2 puffs into the lungs every 4 (four) hours as needed for wheezing. (Patient taking differently: Inhale 1-2 puffs into the lungs every 4 (four) hours as needed for wheezing or shortness of breath. ), Disp: 1 Inhaler, Rfl: 12 .  Multiple Vitamin (MULTIVITAMIN) tablet, Take 1 tablet by mouth every morning. , Disp: , Rfl:  .  omeprazole (PRILOSEC) 20 MG capsule, Take 20 mg by mouth daily before breakfast. , Disp: , Rfl:  .  ONE TOUCH ULTRA TEST test strip, 1 each by Other route 3 (three) times daily. , Disp: , Rfl: 4 .   OXYGEN, Increase oxygen to 6L with acitivity, Disp: , Rfl:  .  polyethylene glycol (MIRALAX / GLYCOLAX) packet, Take 17 g by mouth daily., Disp: , Rfl:  .  potassium chloride (KLOR-CON) 10 MEQ CR tablet, Take 20 mEq by mouth 2 (two) times daily., Disp: , Rfl:  .  rivaroxaban (XARELTO) 20 MG TABS tablet, Take 1 tablet (20 mg total) by mouth daily with supper. Restart on Wednesday May 11 , 2016, Disp: 30 tablet, Rfl:  .  tiotropium (SPIRIVA) 18 MCG inhalation capsule, Place 1 capsule (18 mcg total) into inhaler and inhale every morning., Disp: 30 capsule, Rfl: 11 .  verapamil (VERELAN PM) 360 MG 24 hr capsule, TAKE ONE CAPSULE BY MOUTH DAILY, Disp: 30 capsule, Rfl: 5

## 2015-08-10 NOTE — Assessment & Plan Note (Signed)
Results of prior pulmonary function testing reviewed. She does have moderate airflow obstruction but severe symptoms which make her a gold grade D. As above, I have strongly encouraged exercise. She previously failed pulmonary rehabilitation, that may be of some value now, but I want her to start exercising at home first and see how that goes.  Plan: Continue bronchodilators Start exercise as detailed above

## 2015-08-10 NOTE — Assessment & Plan Note (Signed)
Continue oxygen as prescribed Walk for O2 titration next visit.

## 2015-08-10 NOTE — Assessment & Plan Note (Signed)
The results of the most recent right heart catheterization were reviewed which surprisingly showed a normal wedge pressure and basically unchanged pulmonary hypertension compared to the 2013 study. I agree that there is really no role for pulmonary vasodilators considering her significant COPD.  Plan: Continue Lasix dosing as prescribed

## 2015-08-10 NOTE — Assessment & Plan Note (Signed)
This has improved significantly recently with the addition of the new dose of Lasix. Clearly, this is a multifactorial problem is she is profoundly deconditioned. I encouraged her at length today to start a regular exercise routine.  Plan: Start exercising Continue current diuretic regimen as prescribed by Dr. Marigene Ehlers

## 2015-08-10 NOTE — Patient Instructions (Addendum)
I recommend that she gradually increase your activity as we discussed, start with 10 minutes of walking daily, sit to stand exercises and lifting light weights. Gradually increase activity but remain consistent and do it every day. Keep using her oxygen as you're doing Keep taking diuretics as you're doing He taking the inhalers as you're doing I will see you back in 3 months or sooner if needed

## 2015-08-16 DIAGNOSIS — E1149 Type 2 diabetes mellitus with other diabetic neurological complication: Secondary | ICD-10-CM | POA: Diagnosis not present

## 2015-08-16 DIAGNOSIS — I10 Essential (primary) hypertension: Secondary | ICD-10-CM | POA: Diagnosis not present

## 2015-08-16 DIAGNOSIS — N390 Urinary tract infection, site not specified: Secondary | ICD-10-CM | POA: Diagnosis not present

## 2015-08-16 DIAGNOSIS — M810 Age-related osteoporosis without current pathological fracture: Secondary | ICD-10-CM | POA: Diagnosis not present

## 2015-08-16 DIAGNOSIS — E784 Other hyperlipidemia: Secondary | ICD-10-CM | POA: Diagnosis not present

## 2015-08-16 DIAGNOSIS — Z0000 Encounter for general adult medical examination without abnormal findings: Secondary | ICD-10-CM | POA: Diagnosis not present

## 2015-08-16 DIAGNOSIS — R8290 Unspecified abnormal findings in urine: Secondary | ICD-10-CM | POA: Diagnosis not present

## 2015-08-17 ENCOUNTER — Ambulatory Visit (HOSPITAL_COMMUNITY)
Admission: RE | Admit: 2015-08-17 | Discharge: 2015-08-17 | Disposition: A | Discharge status: Home / Self Care | Payer: Medicare Other | Source: Ambulatory Visit | Attending: Cardiology | Admitting: Cardiology

## 2015-08-17 VITALS — BP 130/51 | HR 90 | Wt 210.0 lb

## 2015-08-17 DIAGNOSIS — E119 Type 2 diabetes mellitus without complications: Secondary | ICD-10-CM | POA: Insufficient documentation

## 2015-08-17 DIAGNOSIS — Z9981 Dependence on supplemental oxygen: Secondary | ICD-10-CM | POA: Insufficient documentation

## 2015-08-17 DIAGNOSIS — R0602 Shortness of breath: Secondary | ICD-10-CM

## 2015-08-17 DIAGNOSIS — Z833 Family history of diabetes mellitus: Secondary | ICD-10-CM | POA: Insufficient documentation

## 2015-08-17 DIAGNOSIS — E669 Obesity, unspecified: Secondary | ICD-10-CM | POA: Diagnosis not present

## 2015-08-17 DIAGNOSIS — Z8249 Family history of ischemic heart disease and other diseases of the circulatory system: Secondary | ICD-10-CM | POA: Diagnosis not present

## 2015-08-17 DIAGNOSIS — I110 Hypertensive heart disease with heart failure: Secondary | ICD-10-CM | POA: Insufficient documentation

## 2015-08-17 DIAGNOSIS — I5032 Chronic diastolic (congestive) heart failure: Secondary | ICD-10-CM | POA: Diagnosis not present

## 2015-08-17 DIAGNOSIS — Z7901 Long term (current) use of anticoagulants: Secondary | ICD-10-CM | POA: Diagnosis not present

## 2015-08-17 DIAGNOSIS — J449 Chronic obstructive pulmonary disease, unspecified: Secondary | ICD-10-CM | POA: Diagnosis not present

## 2015-08-17 DIAGNOSIS — I272 Other secondary pulmonary hypertension: Secondary | ICD-10-CM | POA: Diagnosis not present

## 2015-08-17 DIAGNOSIS — Z87891 Personal history of nicotine dependence: Secondary | ICD-10-CM | POA: Insufficient documentation

## 2015-08-17 DIAGNOSIS — I480 Paroxysmal atrial fibrillation: Secondary | ICD-10-CM | POA: Diagnosis not present

## 2015-08-17 DIAGNOSIS — G4733 Obstructive sleep apnea (adult) (pediatric): Secondary | ICD-10-CM | POA: Insufficient documentation

## 2015-08-17 DIAGNOSIS — Z79899 Other long term (current) drug therapy: Secondary | ICD-10-CM | POA: Diagnosis not present

## 2015-08-17 DIAGNOSIS — Z823 Family history of stroke: Secondary | ICD-10-CM | POA: Diagnosis not present

## 2015-08-17 DIAGNOSIS — Z794 Long term (current) use of insulin: Secondary | ICD-10-CM | POA: Diagnosis not present

## 2015-08-17 LAB — BASIC METABOLIC PANEL
ANION GAP: 10 (ref 5–15)
BUN: 15 mg/dL (ref 6–20)
CALCIUM: 9.7 mg/dL (ref 8.9–10.3)
CHLORIDE: 100 mmol/L — AB (ref 101–111)
CO2: 30 mmol/L (ref 22–32)
Creatinine, Ser: 0.96 mg/dL (ref 0.44–1.00)
GFR calc non Af Amer: 59 mL/min — ABNORMAL LOW (ref >60)
GLUCOSE: 59 mg/dL — AB (ref 65–99)
Potassium: 4.2 mmol/L (ref 3.5–5.1)
Sodium: 140 mmol/L (ref 135–145)

## 2015-08-17 NOTE — Patient Instructions (Signed)
Routine lab work today. Will notify you of abnormal results  Follow up in 3 months  

## 2015-08-17 NOTE — Progress Notes (Addendum)
Advanced Heart Failure Medication Review by a Pharmacist  Does the patient  feel that his/her medications are working for him/her?  yes  Has the patient been experiencing any side effects to the medications prescribed?  no  Does the patient measure his/her own blood pressure or blood glucose at home?  no   Does the patient have any problems obtaining medications due to transportation or finances?   Yes (states that copays are high due to high deductible)  Understanding of regimen: good Understanding of indications: good Potential of compliance: good Patient understands to avoid NSAIDs. Patient understands to avoid decongestants.  Issues to address at subsequent visits: None   Pharmacist comments: 70 YO pleasant female presenting with her husband for HF followup. Pt denies SE to current regimen and denies s/sx bleeding with xarelto. Pt states that she is having trouble with copays of her meds due to being in her deductible with medicare.  Will refer to [facility] for the [place] to assist with copay for her HF meds.  Pt states that her xarelto was ~$150 at last visit.    Time with patient: 10 min  Preparation and documentation time: 5 min  Total time: 15 min

## 2015-08-17 NOTE — Progress Notes (Signed)
Patient ID: Destiny Shelton, female   DOB: [birth year], 70 y.o.   MRN: [id number]  PCP: Dr. Brigitte Pulse Cardiology: Dr Ellyn Hack HF Cardiology: Dr. Aundra Dubin  70 yo with history of paroxysmal atrial fibrillation, chronic diastolic CHF with restrictive hemodynamics on 2013 RHC, COPD on home oxygen, inappropriate sinus tachycardia, and concern for intracardiac shunting presents for CHF clinic evaluation.  She has been followed for several years by both cardiology and pulmonology.  She is a prior smoker and has been diagnosed with COPD.  Interestingly, her PFTs in 10/16 showed improvement, suggesting mild obstructive airways disease.  However, she still requires home oxygen.  She had transcranial dopplers in 2013 suggesting a medium-sized right to left shunt.  This led to an extensive workup.  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  Cardiac MRI was unremarkable, no evidence for shunt lesion.  RHC/LHC showed no coronary disease but it did show restrictive hemodynamics and volume overload.  Qp/Qs from this study was 1.27/1, suggesting a relatively small left to right shunt.  A definite shunt lesion was never identified.    When I saw her at last appointment, she reported significant exertional dyspnea and looked volume overloaded on exam.  I increased her Lasix.  I did an echo, showing normal LV size and systolic function and normal RV.  I also did a RHC: on higher dose diuretics, she had mild pulmonary hypertension and normal PCWP.  There was no evidence for a shunt lesion with Qp/Qs 0.97.   She returns for HF followup. Overall feeling pretty good. Mild dyspnea with exertion. Denies orthopnea/PND. Wears oxygen --> 4 liters continuous and 6 liters with exertion. Weight at home 206-212 pounds. Ambulates with rolling walker.  Taking all medications.    Labs (9/16): digoxin 0.6, K 4.3, creatinine 0.85 Labs (1/17): K 3.9, creatinine 0.96, hgb 8.9 Labs 07/20/2015: K 4.3 Creatinine 0.99   PMH: 1.  Atrial fibrillation: Paroxysmal.  2. HTN 3. COPD: Home oxygen.  PFTs (10/16) with FVC 77%, FEV1 68%, TLC 89% => mild obstruction (improved from the past).   4. Type II diabetes 5. Obesity 6. Inappropriate sinus tachycardia 7. OSA: Does not use CPAP, uses oxygen at night.  8. Chronic diastolic CHF: RHC/LHC in 0000000 with no significant CAD, mean RA 19, RV 46/18, PA 45/29 mean 37, mean PCWP 22, CI 4.1, PVR 1.4 WU => restrictive hemodynamics with no evidence for constriction;  Qp/Qs 1.27/1; saturation run difficult to interpret.  TEE (10/13): EF 55-60%, LVH, mild MR, bubble study showed no evidence for immediate R=>L bubble crossing, small amount of bubbles ended up in left heart > 5 seconds after injection suggesting possible intrapulmonary shunting; no shunt by color doppler.  Cardiac MRI (8/13) with EF 59%, no LGE, normal RV size/systolic function => interpreted as normal.  RHC 07/04/2015: RA mean 9, RV 45/10, PA 41/20 mean 30, PCWP mean 13, no step up on shunt run,cardiac output (Fick) 6.7 cardiac index (Fick) 3.3, PVR 2.5 WU, Qp/Qs = 0.97. Echo (2/17) with EF 55-60%, mild LVH, mild MR, normal RV size and systolic function.  9. ?Intracardiac shunt: Transcranial dopplers in 10/13 were suggestive of medium-sized right to left shunt.  Bubble study on TEE in 10/13 showed no evidence for immediate R=>L bubble crossing, small amount of bubbles ended up in left heart > 5 seconds after injection suggesting possible intrapulmonary shunting; no shunt by color doppler. Cardiac MRI showed no evidence for shunting.  Cardiac cath in 2013 actually was suggestive of a  small left to right shunt.  Cardiac cath in 2/17 NOT suggestive of significant shunt, Qp/Qs 0.97.  10. ECHO 07/2015 EF 55-60%. Grade I DD  SH: Married, prior heavy smoker (quit 1996).  No ETOH.   Family History  Problem Relation Age of Onset  . Heart disease Mother   . Stroke Mother   . Heart disease Father   . Heart disease Brother   . Stroke Brother     . Skin cancer Brother   . Colon cancer Neg Hx   . Colon polyps Sister   . Diabetes Sister   . Diabetes Brother   . Irritable bowel syndrome Daughter    ROS: All systems reviewed and negative except as per HPI.   Current Outpatient Prescriptions  Medication Sig Dispense Refill  . acetaminophen (TYLENOL) 500 MG tablet 500 mg every 6 (six) hours as needed for mild pain. Take per bottle as needed for pain    . albuterol (PROVENTIL) (2.5 MG/3ML) 0.083% nebulizer solution Take 3 mLs (2.5 mg total) by nebulization every 6 (six) hours as needed for wheezing or shortness of breath. 75 mL 3  . ALPRAZolam (XANAX) 0.5 MG tablet Take 1/2 tablet by mouth daily as needed for anxiety    . AMBULATORY NON FORMULARY MEDICATION Continuous O2 @4LMP     . atorvastatin (LIPITOR) 40 MG tablet Take 1 tablet by mouth at bedtime.     . benzonatate (TESSALON) 200 MG capsule Take 400 mg by mouth 3 (three) times daily as needed for cough.     . budesonide-formoterol (SYMBICORT) 160-4.5 MCG/ACT inhaler Inhale 2 puffs into the lungs 2 (two) times daily.    Marland Kitchen buPROPion (WELLBUTRIN XL) 150 MG 24 hr tablet Take 300 mg by mouth every morning.     Marland Kitchen DALIRESP 500 MCG TABS tablet Take 1 tablet by mouth daily.  6  . docusate sodium (COLACE) 100 MG capsule Take 100 mg by mouth every morning.     . DULoxetine (CYMBALTA) 60 MG capsule Take 60 mg by mouth every morning.     . furosemide (LASIX) 40 MG tablet Take 60 mg by mouth 2 (two) times daily.    Marland Kitchen gabapentin (NEURONTIN) 600 MG tablet Take 600 mg by mouth 3 (three) times daily.     Marland Kitchen HYDROcodone-acetaminophen (NORCO/VICODIN) 5-325 MG per tablet Take 1 tablet by mouth every 4 (four) hours as needed for moderate pain.     Marland Kitchen insulin glargine (LANTUS) 100 UNIT/ML injection Inject 70 Units into the skin daily before breakfast. 10 mL   . insulin lispro (HUMALOG) 100 UNIT/ML injection Inject 8 Units into the skin 2 (two) times daily.    . irbesartan (AVAPRO) 150 MG tablet TAKE 1  TABLET(150 MG) BY MOUTH DAILY 30 tablet 3  . iron polysaccharides (NIFEREX) 150 MG capsule Take 150 mg by mouth daily.    Marland Kitchen levalbuterol (XOPENEX HFA) 45 MCG/ACT inhaler Inhale 1-2 puffs into the lungs every 4 (four) hours as needed for wheezing. 1 Inhaler 12  . Multiple Vitamin (MULTIVITAMIN) tablet Take 1 tablet by mouth every morning.     Marland Kitchen omeprazole (PRILOSEC) 20 MG capsule Take 20 mg by mouth daily before breakfast.     . ONE TOUCH ULTRA TEST test strip 1 each by Other route 3 (three) times daily.   4  . OXYGEN Increase oxygen to 6L with acitivity    . polyethylene glycol (MIRALAX / GLYCOLAX) packet Take 17 g by mouth daily.    . potassium chloride (KLOR-CON)  10 MEQ CR tablet Take 20 mEq by mouth 2 (two) times daily.    . rivaroxaban (XARELTO) 20 MG TABS tablet Take 1 tablet (20 mg total) by mouth daily with supper. Restart on Wednesday May 11 , 2016 30 tablet   . tiotropium (SPIRIVA) 18 MCG inhalation capsule Place 1 capsule (18 mcg total) into inhaler and inhale every morning. 30 capsule 11  . verapamil (VERELAN PM) 360 MG 24 hr capsule TAKE ONE CAPSULE BY MOUTH DAILY 30 capsule 5   No current facility-administered medications for this encounter.   BP 130/51 mmHg  Pulse 90  Wt 210 lb (95.255 kg)  SpO2 92% General: NAD, obese, wearing oxygen.  Neck: JVP 7-8 cm, no thyromegaly or thyroid nodule.  Lungs: Clear to auscultation bilaterally with normal respiratory effort. CV: Nondisplaced PMI.  Heart tachy, regular S1/S2, no S3/S4, 2/6 HSM LLSB.  No edema.  No carotid bruit.  Normal pedal pulses.  Abdomen: Soft, nontender, no hepatosplenomegaly, no distention.  Skin: Intact without lesions or rashes.  Neurologic: Alert and oriented x 3.  Psych: Normal affect. Extremities: No clubbing or cyanosis.  HEENT: Normal.   Assessment/Plan: 1. COPD: On home oxygen, 4L at rest and 6L with exertion.  PFTs in 10/16 actually showed only mild obstruction (improved).  Breathing is better with  diuresis, suggesting that CHF plays a significant role in her symptoms in addition to COPD. Stable.  2. Atrial fibrillation: Paroxysmal, CHADSVASC = 4.    - She will continue Xarelto.  - She can continue verapamil  3. Chronic diastolic CHF: At last appointment lasix was increased.  NYHA class III, as I suspect that COPD plays a role in the dyspnea as well.  She does not look volume overloaded on exam. - Continue Lasix 60 mg bid.  BMET today --> K 4.2 Creatinine 0.96  4. ?Shunt lesion: She had transcranial dopplers in 2013 suggesting a medium-sized right to left shunt.  This led to an extensive workup.  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  Cardiac MRI was unremarkable, no evidence for shunt lesion.  RHC/LHC in 2013 showed no coronary disease but it did show restrictive hemodynamics and volume overload.  Qp/Qs from this study was 1.27/1, suggesting a relatively small left to right shunt.  A definite shunt lesion was never identified.  Based on prior workup, it seems most likely that the right to left shunting by transcranial dopplers and TEE bubble study was due to pulmonary AVMs.  RHC repeated in 2/17 did not show a significant shunt lesion, with Qp/Qs 0.97.  5. Inappropriate sinus tachycardia: Resolved. HR today 90.  6. Pulmonary hypertension: Mild pulmonary hypertension on RHC with PVR only 2.5 WU.  Probably due to COPD and elevated left atrial pressure.  No specific treatment other than diuresis.   Follow up 3 months.   Demosthenes Virnig NP-C  08/17/2015

## 2015-08-22 DIAGNOSIS — I10 Essential (primary) hypertension: Secondary | ICD-10-CM | POA: Diagnosis not present

## 2015-08-22 DIAGNOSIS — Z1389 Encounter for screening for other disorder: Secondary | ICD-10-CM | POA: Diagnosis not present

## 2015-08-22 DIAGNOSIS — E1149 Type 2 diabetes mellitus with other diabetic neurological complication: Secondary | ICD-10-CM | POA: Diagnosis not present

## 2015-08-22 DIAGNOSIS — E784 Other hyperlipidemia: Secondary | ICD-10-CM | POA: Diagnosis not present

## 2015-08-22 DIAGNOSIS — N182 Chronic kidney disease, stage 2 (mild): Secondary | ICD-10-CM | POA: Diagnosis not present

## 2015-08-22 DIAGNOSIS — E1129 Type 2 diabetes mellitus with other diabetic kidney complication: Secondary | ICD-10-CM | POA: Diagnosis not present

## 2015-08-22 DIAGNOSIS — I272 Other secondary pulmonary hypertension: Secondary | ICD-10-CM | POA: Diagnosis not present

## 2015-08-22 DIAGNOSIS — Z0000 Encounter for general adult medical examination without abnormal findings: Secondary | ICD-10-CM | POA: Diagnosis not present

## 2015-08-22 DIAGNOSIS — Z6833 Body mass index (BMI) 33.0-33.9, adult: Secondary | ICD-10-CM | POA: Diagnosis not present

## 2015-08-22 DIAGNOSIS — J449 Chronic obstructive pulmonary disease, unspecified: Secondary | ICD-10-CM | POA: Diagnosis not present

## 2015-08-22 DIAGNOSIS — I4891 Unspecified atrial fibrillation: Secondary | ICD-10-CM | POA: Diagnosis not present

## 2015-08-22 DIAGNOSIS — I6529 Occlusion and stenosis of unspecified carotid artery: Secondary | ICD-10-CM | POA: Diagnosis not present

## 2015-08-23 DIAGNOSIS — Z23 Encounter for immunization: Secondary | ICD-10-CM | POA: Diagnosis not present

## 2015-08-28 DIAGNOSIS — Z1212 Encounter for screening for malignant neoplasm of rectum: Secondary | ICD-10-CM | POA: Diagnosis not present

## 2015-09-03 ENCOUNTER — Telehealth (HOSPITAL_COMMUNITY): Payer: Self-pay | Admitting: Pharmacist

## 2015-09-03 NOTE — Telephone Encounter (Signed)
[employer] denied patient assistance for Xarelto unless patient has spent 3% annual income on prescriptions for the year. Discussed this with Mrs. Simson and she will get pharmacy print out for [place].   Ruta Hinds. Velva Harman, PharmD, BCPS, CPP Clinical Pharmacist Pager: [phone] Phone: [phone] 09/03/2015 3:53 PM

## 2015-09-06 ENCOUNTER — Encounter: Payer: Self-pay | Admitting: Cardiology

## 2015-09-10 ENCOUNTER — Telehealth (HOSPITAL_COMMUNITY): Payer: Self-pay | Admitting: *Deleted

## 2015-09-10 DIAGNOSIS — Z6834 Body mass index (BMI) 34.0-34.9, adult: Secondary | ICD-10-CM | POA: Diagnosis not present

## 2015-09-10 DIAGNOSIS — J449 Chronic obstructive pulmonary disease, unspecified: Secondary | ICD-10-CM | POA: Diagnosis not present

## 2015-09-10 DIAGNOSIS — E1149 Type 2 diabetes mellitus with other diabetic neurological complication: Secondary | ICD-10-CM | POA: Diagnosis not present

## 2015-09-10 DIAGNOSIS — E11621 Type 2 diabetes mellitus with foot ulcer: Secondary | ICD-10-CM | POA: Diagnosis not present

## 2015-09-10 NOTE — Telephone Encounter (Signed)
Pt called to request samples of Xarelto, she also states she has her paperwork Destiny Shelton needed her to bring in for her application for pt assit program.  Samples left a front desk, pt will pick them up and leave the paperwork.  Medication Samples have been provided to the patient.  Drug name: Xarelto       Strength: 20mg         Qty: 1 bottle  LOT: [id number]  Exp.Date: 8/19  The patient has been instructed regarding the correct time, dose, and frequency of taking this medication, including desired effects and most common side effects.   Destiny Shelton 8:50 AM 09/10/2015

## 2015-10-16 ENCOUNTER — Ambulatory Visit (INDEPENDENT_AMBULATORY_CARE_PROVIDER_SITE_OTHER): Payer: Medicare Other | Admitting: Internal Medicine

## 2015-10-16 ENCOUNTER — Encounter: Payer: Self-pay | Admitting: Internal Medicine

## 2015-10-16 VITALS — BP 120/60 | HR 98 | Ht 65.0 in | Wt 218.0 lb

## 2015-10-16 DIAGNOSIS — D125 Benign neoplasm of sigmoid colon: Secondary | ICD-10-CM | POA: Diagnosis not present

## 2015-10-16 DIAGNOSIS — R195 Other fecal abnormalities: Secondary | ICD-10-CM

## 2015-10-16 DIAGNOSIS — D509 Iron deficiency anemia, unspecified: Secondary | ICD-10-CM | POA: Diagnosis not present

## 2015-10-16 NOTE — Patient Instructions (Signed)
  We will request your lab work done at Dr Raul Del office.    Follow up with Dr Carlean Purl in 6 months.  Recall put in.     I appreciate the opportunity to care for you. Silvano Rusk, MD, [facility]

## 2015-10-16 NOTE — Progress Notes (Signed)
   Subjective:    Patient ID: Destiny Shelton, female    DOB: 10/08/1945, 70 y.o.   MRN: [id number] Chief complaint: Follow-up colon polyp, heme positive HPI Destiny Shelton is here with her husband. She has a history of numerous colon polyps and she also had high-grade dysplasia in a large polyp in her sigmoid colon. Repeat exams over time have shown no high-grade dysplasia and I removed most of the large sigmoid adenoma and ablated it, last exam with colonoscopy polypectomy and ablation I 2016. He reports that she was Heme + on routine annual Hemoccult testing through Dr. Raul Del office. She has not had any bleeding though she is anemic with a hemoglobin of 10.3 and MCV 71. She remains on Xarelto. She is on Niferex daily. Medications, allergies, past medical history, past surgical history, family history and social history are reviewed and updated in the EMR.  Review of Systems Remains on home oxygen no she is now on 4-6 L which is increased. Chronic dyspnea on exertion.    Objective:   Physical Exam BP 120/60 mmHg  Pulse 98  Ht 5\' 5"  (1.651 m)  Wt 218 lb (98.884 kg)  BMI 36.28 kg/m2 She is chronically ill but in no acute distress      Assessment & Plan:   1. Benign neoplasm of sigmoid colon   2. Microcytic anemia   3. Heme + stool    Her medical status seems to be worse. I'm not inclined to pursue a colonoscopy right now on neither she. Her anemia is really chronic. I'm not surprised at all that she had Hemoccult-positive stool given what we know about her and the fact that she probably has residual polyp in her sigmoid colon. We will reassess her in 6 months sooner if needed. She should remain on iron, she may need a higher dose. He could have some component of anemia of chronic disease.  I told her she does not need to do annual Hemoccults.  15 minutes time spent with patient > half in counseling coordination of care  I appreciate the opportunity to care for this patient. CC: Marton Redwood, MD

## 2015-10-22 ENCOUNTER — Encounter: Payer: Self-pay | Admitting: Internal Medicine

## 2015-11-12 ENCOUNTER — Other Ambulatory Visit: Payer: Self-pay | Admitting: Internal Medicine

## 2015-11-12 ENCOUNTER — Ambulatory Visit (INDEPENDENT_AMBULATORY_CARE_PROVIDER_SITE_OTHER): Payer: Medicare Other | Admitting: Pulmonary Disease

## 2015-11-12 ENCOUNTER — Encounter: Payer: Self-pay | Admitting: Pulmonary Disease

## 2015-11-12 VITALS — BP 134/64 | HR 100 | Ht 65.0 in | Wt 218.4 lb

## 2015-11-12 DIAGNOSIS — I272 Other secondary pulmonary hypertension: Secondary | ICD-10-CM

## 2015-11-12 DIAGNOSIS — J449 Chronic obstructive pulmonary disease, unspecified: Secondary | ICD-10-CM | POA: Diagnosis not present

## 2015-11-12 DIAGNOSIS — J9611 Chronic respiratory failure with hypoxia: Secondary | ICD-10-CM | POA: Diagnosis not present

## 2015-11-12 MED ORDER — DOXYCYCLINE HYCLATE 100 MG PO TABS: 100.0000 mg | ORAL_TABLET | Freq: Two times a day (BID) | ORAL | Status: DC

## 2015-11-12 NOTE — Assessment & Plan Note (Signed)
Due to severe COPD, no indication for pulmonary vasodilator. Her weight has been up recently, I believe this is due to dietary indiscretion (specifically, not limiting her fluid intake).  Plan: Instructed her to follow strict limitations on fluid intake for the next 7 days, weight herself daily, and then follow-up with Dr. Aundra Dubin next Monday. If no change in her weight (today's was 218 pounds) then he may want to adjust her diuretics

## 2015-11-12 NOTE — Assessment & Plan Note (Signed)
Though she only has moderate airflow obstruction she has severe symptoms.  In the last month, she has been experiencing increasing mucus production. This is somewhat subacute and so I wonder if this may be representative of an atypical infection.  Plan Continue Symbicort and Spiriva Sputum culture for bacteria, fungal, AFB After providing sputum culture treat as a typical exacerbation with doxycycline Follow-up in 3 months

## 2015-11-12 NOTE — Patient Instructions (Signed)
Provide [place] a sample mucus After you have coughed up the mucus, start taking the antibiotic twice a day for 5 days Keep taking your inhaled medicines as you're doing Increase oxygen 6 L when walking Limit your fluid intake to less than 2 L a day Weight yourself daily We will see you back in 3 months or sooner if needed

## 2015-11-12 NOTE — Assessment & Plan Note (Signed)
She was advised today to increase her O2 to 6 L when exerting herself, 5 L at rest

## 2015-11-12 NOTE — Progress Notes (Signed)
Subjective:    Patient ID: Destiny Shelton, female    DOB: 1946-02-02, 70 y.o.   MRN: [id number]  Synopsys: Former patient of Dr. Gwenette Greet with pulmonary hypertension, Afib, and COPD.  She smoked 2 packs per day for 25 years and quit in the 1990's.  She has been on oxygen since 2012.  As of 2017 She has been using 5L O2 at rest and 6 L with exertion.   She does not have a history of PE that she is aware of.   pfts 2011:  FEV1 1.34 (55%), ratio 68, ++airtrapping on lung volumes, nl TLC, DLCO 40% pred. PFT's  03/23/2015  FEV1 1.61 (68 % ) ratio 69  p no % improvement from saba on   Symb/spiriva  Echo 2012: nl LV and EF, impaired relaxation, nothing to suggest pulm htn. Myoview 2012: no ischemia LHC 12/2011: non-obstructive cad RHC 12/2011:  PA 45/29 with mean 59mm, PCWP 22, CO Fick 10.25, PVR 1.17 wood units, slight step up in saturation from   RV to PA [place] 12/2011:  Equal 4 chamber pressures, but no square root sign.   Echo [id number]:  Nl LV, diastolic dysfxn, normal RV, estimated RVSP 46.  TEE 03/2012:  A few late bubbles seen felt c/w small IP shunting.  CT chest 12/2011:  No PE, very mild mosaic perfusion abnl (prob secondary to her known airflow obstruction).  Cardiac MRI 2013:  No infiltrative process.  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  07/04/2015 RHC: RA mean 9 RV 45/10 PA 41/20, mean 30 PCWP mean 13  Oxygen saturations: SVC 68% RA 65% RV 61% PA 69% LA 98% Peripheral sat 93%  Cardiac Output (Fick) 6.7  Cardiac Index (Fick) 3.3 PVR 2.5 WU Qp/Qs = 0.97    HPI Chief Complaint  Patient presents with  . Follow-up    pt has good and bad days- sob with exertion worse qam, prod cough with green mucus X1 month.     Norrie says that she has been doing OK.  She feels like sometimes her fluid pill is not working as well as it should.  She says that she his not urinating as much and her weight has been going up.  She does not restrict her fluid intake.  She hsa  been trying to keep her weight at 212 or 213, but she has been going up over the last month.  She continues to cough and produce mucus with green mucus production.  She says it is green nearly every time.  This has been going on for a month now.  She says the coughing spells were worse a month ago.  She feels a bit more short of breath and she has been wheezing some, maybe not too much more than before.    She has been more short of breath with minimal activity such as walking to the bathroom.  She has not taken prednisone or antibiotics since the last visit.  Notes compliance with her symbicort and spiriva.   Past Medical History  Diagnosis Date  . ALLERGIC RHINITIS   . Hypertension   . Adenomatous colon polyp   . Gastritis   . Diverticulosis of colon   . Diabetes mellitus type 2 with neurological manifestations ([place])     On Insulin  . Depression   . Osteoporosis   . Morbidly obese ([place])   . Back pain, chronic   . Anxiety   . Hyperlipemia   .  COPD (chronic obstructive pulmonary disease) ([place])     Requiring Home O2 at 4L  . GERD (gastroesophageal reflux disease)   . Urosepsis 05/26/2012  . Atrial tachycardia (HCC)     Mostly Sinus Tachycardia  . PAF (paroxysmal atrial fibrillation) ([place])   . Osteoarthritis   . External hemorrhoids   . Carotid stenosis   . Nephrolithiasis   . Headache(784.0)     tension  . Neuromuscular disorder ([place])     diabetic neuropathy  . Fibromyalgia     "pain in arms and shoulders."  . Anemia     iron deficient  . Inappropriate sinus tachycardia ([place])   . 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  . Community acquired pneumonia - Recent Admission (07/2013) 07/08/2013  . Sleep apnea     no cpap machine  02 at 4l/min all the time      Review of Systems  Constitutional: Negative for fever, chills, diaphoresis and fatigue.  HENT: Negative for postnasal drip, rhinorrhea and sinus pressure.   Respiratory: Positive for cough and shortness of breath. Negative for  wheezing.   Cardiovascular: Negative for chest pain, palpitations and leg swelling.       Objective:   Physical Exam Filed Vitals:   11/12/15 1029  BP: 134/64  Pulse: 100  Height: 5\' 5"  (1.651 m)  Weight: 99.066 kg (218 lb 6.4 oz)  SpO2: 92%   5L [place]  Gen: chronically ill appearing HENT: OP clear, TM's clear, neck supple PULM: few fine crackles bases B, normal percussion CV: RRR,slight systolic murmur LUSB, trace edema GI: BS+, soft, nontender Derm: no cyanosis or rash Psyche: normal mood and affect      Assessment & Plan:  Pulmonary hypertension (HCC) Due to severe COPD, no indication for pulmonary vasodilator. Her weight has been up recently, I believe this is due to dietary indiscretion (specifically, not limiting her fluid intake).  Plan: Instructed her to follow strict limitations on fluid intake for the next 7 days, weight herself daily, and then follow-up with Dr. Aundra Dubin next Monday. If no change in her weight (today's was 218 pounds) then he may want to adjust her diuretics  Chronic respiratory failure ([place]) She was advised today to increase her O2 to 6 L when exerting herself, 5 L at rest  COPD GOLD II  Though she only has moderate airflow obstruction she has severe symptoms.  In the last month, she has been experiencing increasing mucus production. This is somewhat subacute and so I wonder if this may be representative of an atypical infection.  Plan Continue Symbicort and Spiriva Sputum culture for bacteria, fungal, AFB After providing sputum culture treat as a typical exacerbation with doxycycline Follow-up in 3 months     Current outpatient prescriptions:  .  acetaminophen (TYLENOL) 500 MG tablet, 500 mg every 6 (six) hours as needed for mild pain. Take per bottle as needed for pain, Disp: , Rfl:  .  albuterol (PROVENTIL) (2.5 MG/3ML) 0.083% nebulizer solution, Take 3 mLs (2.5 mg total) by nebulization every 6 (six) hours as needed for wheezing or  shortness of breath., Disp: 75 mL, Rfl: 3 .  ALPRAZolam (XANAX) 0.5 MG tablet, Take 1/2 tablet by mouth daily as needed for anxiety, Disp: , Rfl:  .  AMBULATORY NON FORMULARY MEDICATION, Continuous O2 @4LMP , Disp: , Rfl:  .  atorvastatin (LIPITOR) 40 MG tablet, Take 1 tablet by mouth at bedtime. , Disp: , Rfl:  .  benzonatate (TESSALON) 200 MG capsule, Take 400 mg by mouth 3 (  three) times daily as needed for cough. , Disp: , Rfl:  .  budesonide-formoterol (SYMBICORT) 160-4.5 MCG/ACT inhaler, Inhale 2 puffs into the lungs 2 (two) times daily., Disp: , Rfl:  .  buPROPion (WELLBUTRIN XL) 150 MG 24 hr tablet, Take 300 mg by mouth every morning. , Disp: , Rfl:  .  DALIRESP 500 MCG TABS tablet, Take 1 tablet by mouth daily., Disp: , Rfl: 6 .  docusate sodium (COLACE) 100 MG capsule, Take 100 mg by mouth every morning. , Disp: , Rfl:  .  DULoxetine (CYMBALTA) 60 MG capsule, Take 60 mg by mouth every morning. , Disp: , Rfl:  .  furosemide (LASIX) 40 MG tablet, Take 60 mg by mouth 2 (two) times daily., Disp: , Rfl:  .  gabapentin (NEURONTIN) 600 MG tablet, Take 600 mg by mouth 3 (three) times daily. , Disp: , Rfl:  .  HYDROcodone-acetaminophen (NORCO/VICODIN) 5-325 MG per tablet, Take 1 tablet by mouth every 4 (four) hours as needed for moderate pain. , Disp: , Rfl:  .  insulin glargine (LANTUS) 100 UNIT/ML injection, Inject 70 Units into the skin daily before breakfast., Disp: 10 mL, Rfl:  .  insulin lispro (HUMALOG) 100 UNIT/ML injection, Inject 8 Units into the skin 2 (two) times daily., Disp: , Rfl:  .  irbesartan (AVAPRO) 150 MG tablet, TAKE 1 TABLET(150 MG) BY MOUTH DAILY, Disp: 30 tablet, Rfl: 3 .  iron polysaccharides (NIFEREX) 150 MG capsule, Take 150 mg by mouth daily., Disp: , Rfl:  .  levalbuterol (XOPENEX HFA) 45 MCG/ACT inhaler, Inhale 1-2 puffs into the lungs every 4 (four) hours as needed for wheezing., Disp: 1 Inhaler, Rfl: 12 .  Multiple Vitamin (MULTIVITAMIN) tablet, Take 1 tablet by  mouth every morning. , Disp: , Rfl:  .  omeprazole (PRILOSEC) 20 MG capsule, Take 20 mg by mouth daily before breakfast. , Disp: , Rfl:  .  ONE TOUCH ULTRA TEST test strip, 1 each by Other route 3 (three) times daily. , Disp: , Rfl: 4 .  OXYGEN, Increase oxygen to 6L with acitivity, Disp: , Rfl:  .  polyethylene glycol (MIRALAX / GLYCOLAX) packet, Take 17 g by mouth daily., Disp: , Rfl:  .  potassium chloride (KLOR-CON) 10 MEQ CR tablet, Take 20 mEq by mouth 2 (two) times daily., Disp: , Rfl:  .  rivaroxaban (XARELTO) 20 MG TABS tablet, Take 1 tablet (20 mg total) by mouth daily with supper. Restart on Wednesday May 11 , 2016, Disp: 30 tablet, Rfl:  .  tiotropium (SPIRIVA) 18 MCG inhalation capsule, Place 1 capsule (18 mcg total) into inhaler and inhale every morning., Disp: 30 capsule, Rfl: 11 .  verapamil (VERELAN PM) 360 MG 24 hr capsule, TAKE ONE CAPSULE BY MOUTH DAILY, Disp: 30 capsule, Rfl: 5 .  doxycycline (VIBRA-TABS) 100 MG tablet, Take 1 tablet (100 mg total) by mouth 2 (two) times daily., Disp: 10 tablet, Rfl: 0

## 2015-11-12 NOTE — Addendum Note (Signed)
Addended by: Len Blalock on: 11/12/2015 12:19 PM   Modules accepted: Orders

## 2015-11-13 DIAGNOSIS — H40023 Open angle with borderline findings, high risk, bilateral: Secondary | ICD-10-CM | POA: Diagnosis not present

## 2015-11-13 DIAGNOSIS — E119 Type 2 diabetes mellitus without complications: Secondary | ICD-10-CM | POA: Diagnosis not present

## 2015-11-13 DIAGNOSIS — H11153 Pinguecula, bilateral: Secondary | ICD-10-CM | POA: Diagnosis not present

## 2015-11-13 DIAGNOSIS — H04123 Dry eye syndrome of bilateral lacrimal glands: Secondary | ICD-10-CM | POA: Diagnosis not present

## 2015-11-13 DIAGNOSIS — Z7984 Long term (current) use of oral hypoglycemic drugs: Secondary | ICD-10-CM | POA: Diagnosis not present

## 2015-11-13 DIAGNOSIS — Z961 Presence of intraocular lens: Secondary | ICD-10-CM | POA: Diagnosis not present

## 2015-11-13 DIAGNOSIS — H40003 Preglaucoma, unspecified, bilateral: Secondary | ICD-10-CM | POA: Diagnosis not present

## 2015-11-13 DIAGNOSIS — H11423 Conjunctival edema, bilateral: Secondary | ICD-10-CM | POA: Diagnosis not present

## 2015-11-13 DIAGNOSIS — Z9849 Cataract extraction status, unspecified eye: Secondary | ICD-10-CM | POA: Diagnosis not present

## 2015-11-13 DIAGNOSIS — H18413 Arcus senilis, bilateral: Secondary | ICD-10-CM | POA: Diagnosis not present

## 2015-11-14 ENCOUNTER — Other Ambulatory Visit: Payer: Medicare Other

## 2015-11-14 DIAGNOSIS — J449 Chronic obstructive pulmonary disease, unspecified: Secondary | ICD-10-CM

## 2015-11-15 ENCOUNTER — Other Ambulatory Visit: Payer: Self-pay | Admitting: Cardiology

## 2015-11-15 NOTE — Telephone Encounter (Signed)
Rx(s) sent to pharmacy electronically.  

## 2015-11-16 ENCOUNTER — Other Ambulatory Visit: Payer: Self-pay

## 2015-11-16 MED ORDER — IRBESARTAN 150 MG PO TABS: 150.0000 mg | ORAL_TABLET | Freq: Every day | ORAL | Status: DC

## 2015-11-16 NOTE — Telephone Encounter (Signed)
Rx Refill

## 2015-11-19 ENCOUNTER — Encounter (HOSPITAL_COMMUNITY): Payer: Self-pay

## 2015-11-19 ENCOUNTER — Ambulatory Visit (HOSPITAL_COMMUNITY)
Admission: RE | Admit: 2015-11-19 | Discharge: 2015-11-19 | Disposition: A | Discharge status: Home / Self Care | Payer: Medicare Other | Source: Ambulatory Visit | Attending: Cardiology | Admitting: Cardiology

## 2015-11-19 VITALS — BP 134/60 | HR 91 | Wt 218.0 lb

## 2015-11-19 DIAGNOSIS — I480 Paroxysmal atrial fibrillation: Secondary | ICD-10-CM | POA: Diagnosis not present

## 2015-11-19 DIAGNOSIS — Z87891 Personal history of nicotine dependence: Secondary | ICD-10-CM | POA: Diagnosis not present

## 2015-11-19 DIAGNOSIS — E669 Obesity, unspecified: Secondary | ICD-10-CM | POA: Diagnosis not present

## 2015-11-19 DIAGNOSIS — Z79899 Other long term (current) drug therapy: Secondary | ICD-10-CM | POA: Insufficient documentation

## 2015-11-19 DIAGNOSIS — Z833 Family history of diabetes mellitus: Secondary | ICD-10-CM | POA: Insufficient documentation

## 2015-11-19 DIAGNOSIS — Z823 Family history of stroke: Secondary | ICD-10-CM | POA: Diagnosis not present

## 2015-11-19 DIAGNOSIS — Z8249 Family history of ischemic heart disease and other diseases of the circulatory system: Secondary | ICD-10-CM | POA: Insufficient documentation

## 2015-11-19 DIAGNOSIS — R000 Tachycardia, unspecified: Secondary | ICD-10-CM | POA: Insufficient documentation

## 2015-11-19 DIAGNOSIS — I272 Other secondary pulmonary hypertension: Secondary | ICD-10-CM | POA: Insufficient documentation

## 2015-11-19 DIAGNOSIS — Z9981 Dependence on supplemental oxygen: Secondary | ICD-10-CM | POA: Diagnosis not present

## 2015-11-19 DIAGNOSIS — I110 Hypertensive heart disease with heart failure: Secondary | ICD-10-CM | POA: Insufficient documentation

## 2015-11-19 DIAGNOSIS — G4733 Obstructive sleep apnea (adult) (pediatric): Secondary | ICD-10-CM | POA: Diagnosis not present

## 2015-11-19 DIAGNOSIS — Z7901 Long term (current) use of anticoagulants: Secondary | ICD-10-CM | POA: Diagnosis not present

## 2015-11-19 DIAGNOSIS — I5032 Chronic diastolic (congestive) heart failure: Secondary | ICD-10-CM | POA: Diagnosis not present

## 2015-11-19 DIAGNOSIS — J449 Chronic obstructive pulmonary disease, unspecified: Secondary | ICD-10-CM | POA: Diagnosis not present

## 2015-11-19 DIAGNOSIS — Z7951 Long term (current) use of inhaled steroids: Secondary | ICD-10-CM | POA: Diagnosis not present

## 2015-11-19 DIAGNOSIS — Z6836 Body mass index (BMI) 36.0-36.9, adult: Secondary | ICD-10-CM | POA: Insufficient documentation

## 2015-11-19 DIAGNOSIS — E119 Type 2 diabetes mellitus without complications: Secondary | ICD-10-CM | POA: Insufficient documentation

## 2015-11-19 DIAGNOSIS — Z794 Long term (current) use of insulin: Secondary | ICD-10-CM | POA: Diagnosis not present

## 2015-11-19 LAB — RESPIRATORY CULTURE OR RESPIRATORY AND SPUTUM CULTURE

## 2015-11-19 LAB — BASIC METABOLIC PANEL
ANION GAP: 8 (ref 5–15)
BUN: 11 mg/dL (ref 6–20)
CALCIUM: 8.9 mg/dL (ref 8.9–10.3)
CHLORIDE: 105 mmol/L (ref 101–111)
CO2: 27 mmol/L (ref 22–32)
Creatinine, Ser: 0.8 mg/dL (ref 0.44–1.00)
GFR calc non Af Amer: >60 mL/min (ref >60)
Glucose, Bld: 124 mg/dL — ABNORMAL HIGH (ref 65–99)
Potassium: 3.8 mmol/L (ref 3.5–5.1)
Sodium: 140 mmol/L (ref 135–145)

## 2015-11-19 LAB — BRAIN NATRIURETIC PEPTIDE: B NATRIURETIC PEPTIDE 5: 30.3 pg/mL (ref 0.0–100.0)

## 2015-11-19 MED ORDER — POTASSIUM CHLORIDE CRYS ER 10 MEQ PO TBCR: 30.0000 meq | EXTENDED_RELEASE_TABLET | Freq: Two times a day (BID) | ORAL | Status: DC

## 2015-11-19 NOTE — Progress Notes (Signed)
Patient ID: Destiny Shelton, female   DOB: 1945/08/09, 70 y.o.   MRN: [id number]  PCP: Dr. Brigitte Pulse HF Cardiology: Dr. Aundra Dubin  70 yo with history of paroxysmal atrial fibrillation, chronic diastolic CHF with restrictive hemodynamics on 2013 RHC, COPD on home oxygen, inappropriate sinus tachycardia, and concern for intracardiac shunting presents for CHF clinic evaluation.  She has been followed for several years by both cardiology and pulmonology.  She is a prior smoker and has been diagnosed with COPD.  Interestingly, her PFTs in 10/16 showed improvement, suggesting mild obstructive airways disease.  However, she still requires home oxygen.  She had transcranial dopplers in 2013 suggesting a medium-sized right to left shunt.  This led to an extensive workup.  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  Cardiac MRI was unremarkable, no evidence for shunt lesion.  RHC/LHC showed no coronary disease but it did show restrictive hemodynamics and volume overload.  Qp/Qs from this study was 1.27/1, suggesting a relatively small left to right shunt.  A definite shunt lesion was never identified.    Echo in 2/17 showed normal LV size and systolic function and normal RV.  I also did a RHC in 2/17: on higher dose diuretics, she had mild pulmonary hypertension and normal PCWP.  There was no evidence for a shunt lesion with Qp/Qs 0.97.   She returns for HF followup. More recently, she has been more short of breath.  She was recently treated with antibiotics for COPD exacerbation.  Weight is up about 8 lbs.  She is now on oxygen at 5L at rest.  More dyspneic x 2 weeks, now SOB walking to bathroom in her house.  She has been sleeping in a recliner for years.  She has a cough with occasional green sputum.  No chest pain.    Labs (9/16): digoxin 0.6, K 4.3, creatinine 0.85 Labs (1/17): K 3.9, creatinine 0.96, hgb 8.9 Labs (3/17): K 4.2, creatinine 0.96  PMH: 1. Atrial fibrillation: Paroxysmal.  2.  HTN 3. COPD: Home oxygen.  PFTs (10/16) with FVC 77%, FEV1 68%, TLC 89% => mild obstruction (improved from the past).   4. Type II diabetes 5. Obesity 6. Inappropriate sinus tachycardia 7. OSA: Does not use CPAP, uses oxygen at night.  8. Chronic diastolic CHF: RHC/LHC in 0000000 with no significant CAD, mean RA 19, RV 46/18, PA 45/29 mean 37, mean PCWP 22, CI 4.1, PVR 1.4 WU => restrictive hemodynamics with no evidence for constriction;  Qp/Qs 1.27/1; saturation run difficult to interpret.  TEE (10/13): EF 55-60%, LVH, mild MR, bubble study showed no evidence for immediate R=>L bubble crossing, small amount of bubbles ended up in left heart > 5 seconds after injection suggesting possible intrapulmonary shunting; no shunt by color doppler.  Cardiac MRI (8/13) with EF 59%, no LGE, normal RV size/systolic function => interpreted as normal.  RHC 07/04/2015: RA mean 9, RV 45/10, PA 41/20 mean 30, PCWP mean 13, no step up on shunt run,cardiac output (Fick) 6.7 cardiac index (Fick) 3.3, PVR 2.5 WU, Qp/Qs = 0.97. Echo (2/17) with EF 55-60%, mild LVH, mild MR, normal RV size and systolic function.  9. ?Intracardiac shunt: Transcranial dopplers in 10/13 were suggestive of medium-sized right to left shunt.  Bubble study on TEE in 10/13 showed no evidence for immediate R=>L bubble crossing, small amount of bubbles ended up in left heart > 5 seconds after injection suggesting possible intrapulmonary shunting; no shunt by color doppler. Cardiac MRI showed no evidence  for shunting.  Cardiac cath in 2013 actually was suggestive of a small left to right shunt.  Cardiac cath in 2/17 NOT suggestive of significant shunt, Qp/Qs 0.97.   SH: Married, prior heavy smoker (quit 1996).  No ETOH.   Family History  Problem Relation Age of Onset  . Heart disease Mother   . Stroke Mother   . Heart disease Father   . Heart disease Brother   . Stroke Brother   . Skin cancer Brother   . Colon cancer Neg Hx   . Colon polyps Sister    . Diabetes Sister   . Diabetes Brother   . Irritable bowel syndrome Daughter    ROS: All systems reviewed and negative except as per HPI.   Current Outpatient Prescriptions  Medication Sig Dispense Refill  . acetaminophen (TYLENOL) 500 MG tablet 500 mg every 6 (six) hours as needed for mild pain. Take per bottle as needed for pain    . ALPRAZolam (XANAX) 0.5 MG tablet Take 1/2 tablet by mouth daily as needed for anxiety    . AMBULATORY NON FORMULARY MEDICATION Continuous O2 @4LMP     . atorvastatin (LIPITOR) 40 MG tablet Take 1 tablet by mouth at bedtime.     . benzonatate (TESSALON) 200 MG capsule Take 400 mg by mouth at bedtime as needed for cough.     . budesonide-formoterol (SYMBICORT) 160-4.5 MCG/ACT inhaler Inhale 2 puffs into the lungs 2 (two) times daily.    Marland Kitchen buPROPion (WELLBUTRIN XL) 150 MG 24 hr tablet Take 300 mg by mouth every morning.     Marland Kitchen DALIRESP 500 MCG TABS tablet Take 1 tablet by mouth daily.  6  . docusate sodium (COLACE) 100 MG capsule Take 100 mg by mouth every morning.     . DULoxetine (CYMBALTA) 60 MG capsule Take 60 mg by mouth every morning.     . furosemide (LASIX) 40 MG tablet Take 80 mg by mouth 2 (two) times daily.    Marland Kitchen gabapentin (NEURONTIN) 600 MG tablet Take 600 mg by mouth 3 (three) times daily.     Marland Kitchen HYDROcodone-acetaminophen (NORCO/VICODIN) 5-325 MG per tablet Take 1 tablet by mouth every 4 (four) hours as needed for moderate pain.     Marland Kitchen insulin glargine (LANTUS) 100 UNIT/ML injection Inject 70 Units into the skin daily before breakfast. 10 mL   . insulin lispro (HUMALOG) 100 UNIT/ML injection Inject 8 Units into the skin 2 (two) times daily.    . irbesartan (AVAPRO) 150 MG tablet Take 1 tablet (150 mg total) by mouth daily. PLEASE CONTACT OFFICE FOR ADDITIONAL REFILLS 30 tablet 0  . iron polysaccharides (NIFEREX) 150 MG capsule Take 150 mg by mouth daily.    Marland Kitchen levalbuterol (XOPENEX HFA) 45 MCG/ACT inhaler Inhale 1-2 puffs into the lungs every 4 (four)  hours as needed for wheezing. 1 Inhaler 12  . Multiple Vitamin (MULTIVITAMIN) tablet Take 1 tablet by mouth every morning.     Marland Kitchen omeprazole (PRILOSEC) 20 MG capsule Take 20 mg by mouth daily before breakfast.     . ONE TOUCH ULTRA TEST test strip 1 each by Other route 3 (three) times daily.   4  . OXYGEN Increase oxygen to 6L with acitivity    . polyethylene glycol (MIRALAX / GLYCOLAX) packet Take 17 g by mouth daily.    . potassium chloride SA (K-DUR,KLOR-CON) 10 MEQ tablet Take 3 tablets (30 mEq total) by mouth 2 (two) times daily. 180 tablet 3  . rivaroxaban (XARELTO)  20 MG TABS tablet Take 1 tablet (20 mg total) by mouth daily with supper. Restart on Wednesday May 11 , 2016 30 tablet   . SYMBICORT 160-4.5 MCG/ACT inhaler INHALE 2 PUFFS BY MOUTH TWICE DAILY 10.2 g 5  . tiotropium (SPIRIVA) 18 MCG inhalation capsule Place 1 capsule (18 mcg total) into inhaler and inhale every morning. 30 capsule 11  . verapamil (VERELAN PM) 360 MG 24 hr capsule TAKE ONE CAPSULE BY MOUTH DAILY 30 capsule 5  . albuterol (PROVENTIL) (2.5 MG/3ML) 0.083% nebulizer solution Take 3 mLs (2.5 mg total) by nebulization every 6 (six) hours as needed for wheezing or shortness of breath. (Patient not taking: Reported on 11/19/2015) 75 mL 3   No current facility-administered medications for this encounter.   BP 134/60 mmHg  Pulse 91  Wt 218 lb (98.884 kg)  SpO2 95% General: NAD, obese, wearing oxygen.  Neck: JVP 8-9 cm, no thyromegaly or thyroid nodule.  Lungs: Slight crackles right base CV: Nondisplaced PMI.  Heart regular S1/S2, no S3/S4, 2/6 HSM LLSB.  Trace ankle edema.  No carotid bruit.  Normal pedal pulses.  Abdomen: Soft, nontender, no hepatosplenomegaly, no distention.  Skin: Intact without lesions or rashes.  Neurologic: Alert and oriented x 3.  Psych: Normal affect. Extremities: No clubbing or cyanosis.  HEENT: Normal.   Assessment/Plan: 1. COPD: On home oxygen, 5L at rest and 6L with exertion.  PFTs in  10/16 actually showed only mild obstruction (improved).  Breathing has improved in the past with increased diuresis, suggesting that CHF plays a significant role in her symptoms in addition to COPD.  2. Atrial fibrillation: Paroxysmal, CHADSVASC = 4.  She remains in NSR today and has not felt atrial fibrillation recently.   - She will continue Xarelto.  - She can continue verapamil.     3. Chronic diastolic CHF: NYHA class III symptoms with volume overload on exam.  Weight is up 8 lbs.  More dyspneic for about 2 wks.  No clear trigger for worsening.  - Increase Lasix to 80 mg bid with KCl 30 bid.  BMET/BNP today and repeat BMET in 10 days.   4. ?Shunt lesion: She had transcranial dopplers in 2013 suggesting a medium-sized right to left shunt.  This led to an extensive workup.  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  Cardiac MRI was unremarkable, no evidence for shunt lesion.  RHC/LHC in 2013 showed no coronary disease but it did show restrictive hemodynamics and volume overload.  Qp/Qs from this study was 1.27/1, suggesting a relatively small left to right shunt.  A definite shunt lesion was never identified.  Based on prior workup, it seems most likely that the right to left shunting by transcranial dopplers and TEE bubble study was due to pulmonary AVMs.  RHC repeated in 2/17 did not show a significant shunt lesion, with Qp/Qs 0.97.  5. Inappropriate sinus tachycardia: HR now around 90.  Had discussed using Corlanor in the past but interacts with verapamil.  6. Pulmonary hypertension: Mild pulmonary hypertension on RHC with PVR only 2.5 WU.  Probably due to COPD and elevated left atrial pressure.  No specific treatment other than diuresis.   Followup in 3 wks.   Loralie Champagne 11/19/2015

## 2015-11-19 NOTE — Progress Notes (Signed)
Advanced Heart Failure Medication Review by a Pharmacist  Does the patient  feel that his/her medications are working for him/her?  yes  Has the patient been experiencing any side effects to the medications prescribed?  no  Does the patient measure his/her own blood pressure or blood glucose at home?  no   Does the patient have any problems obtaining medications due to transportation or finances?   no  Understanding of regimen: good Understanding of indications: good Potential of compliance: good Patient understands to avoid NSAIDs. Patient understands to avoid decongestants.   Pharmacist comments: Ms. Destiny Shelton is a pleasant 26 yof presenting to clinic for a follow-up appointment. She denies any medication-related side effects at this time. She does states that she has been experiencing increased dizziness when bending over, increased SOB upon exertion, and increased leg swelling. She also endorses a chronic irritating cough that she believes to be an infection. She states that her potassium tablets (35mEq) are very difficult for her to swallow and asked if she could take  2 of the 64mEq tablets instead. This was relayed to and accepted by provider. She did not have any additional medication-related questions or concerns at this time.   Glorene Leitzke C. Lennox Grumbles, PharmD Pharmacy Resident  Pager: [phone] 11/19/2015 10:28 AM    Time with patient: 15 min  Preparation and documentation time: 3 min  Total time: 18 min

## 2015-11-19 NOTE — Patient Instructions (Signed)
Increase Lasix to 80mg  twice daily.  Increase Potassium to 66meq twice daily.  Routine lab work today. Will notify you of abnormal results  Repeat Labs (bmet) in 10days.  Follow up with Dr.McLean in 3 weeks

## 2015-11-30 ENCOUNTER — Ambulatory Visit (HOSPITAL_COMMUNITY)
Admission: RE | Admit: 2015-11-30 | Discharge: 2015-11-30 | Disposition: A | Discharge status: Home / Self Care | Payer: Medicare Other | Source: Ambulatory Visit | Attending: Cardiology | Admitting: Cardiology

## 2015-11-30 DIAGNOSIS — I5032 Chronic diastolic (congestive) heart failure: Secondary | ICD-10-CM | POA: Diagnosis not present

## 2015-11-30 DIAGNOSIS — I5022 Chronic systolic (congestive) heart failure: Secondary | ICD-10-CM

## 2015-11-30 LAB — BASIC METABOLIC PANEL
ANION GAP: 11 (ref 5–15)
BUN: 15 mg/dL (ref 6–20)
CO2: 27 mmol/L (ref 22–32)
Calcium: 9.6 mg/dL (ref 8.9–10.3)
Chloride: 100 mmol/L — ABNORMAL LOW (ref 101–111)
Creatinine, Ser: 1.03 mg/dL — ABNORMAL HIGH (ref 0.44–1.00)
GFR, EST NON AFRICAN AMERICAN: 53 mL/min — AB (ref >60)
Glucose, Bld: 108 mg/dL — ABNORMAL HIGH (ref 65–99)
POTASSIUM: 4.2 mmol/L (ref 3.5–5.1)
SODIUM: 138 mmol/L (ref 135–145)

## 2015-12-12 ENCOUNTER — Encounter (HOSPITAL_COMMUNITY): Payer: Self-pay

## 2015-12-12 ENCOUNTER — Ambulatory Visit (HOSPITAL_COMMUNITY)
Admission: RE | Admit: 2015-12-12 | Discharge: 2015-12-12 | Disposition: A | Discharge status: Home / Self Care | Payer: Medicare Other | Source: Ambulatory Visit | Attending: Cardiology | Admitting: Cardiology

## 2015-12-12 VITALS — BP 136/66 | HR 111 | Wt 216.8 lb

## 2015-12-12 DIAGNOSIS — Z7901 Long term (current) use of anticoagulants: Secondary | ICD-10-CM | POA: Diagnosis not present

## 2015-12-12 DIAGNOSIS — Z9981 Dependence on supplemental oxygen: Secondary | ICD-10-CM | POA: Diagnosis not present

## 2015-12-12 DIAGNOSIS — G4733 Obstructive sleep apnea (adult) (pediatric): Secondary | ICD-10-CM | POA: Diagnosis not present

## 2015-12-12 DIAGNOSIS — R000 Tachycardia, unspecified: Secondary | ICD-10-CM | POA: Diagnosis not present

## 2015-12-12 DIAGNOSIS — E119 Type 2 diabetes mellitus without complications: Secondary | ICD-10-CM | POA: Diagnosis not present

## 2015-12-12 DIAGNOSIS — J449 Chronic obstructive pulmonary disease, unspecified: Secondary | ICD-10-CM | POA: Insufficient documentation

## 2015-12-12 DIAGNOSIS — I272 Other secondary pulmonary hypertension: Secondary | ICD-10-CM | POA: Insufficient documentation

## 2015-12-12 DIAGNOSIS — I480 Paroxysmal atrial fibrillation: Secondary | ICD-10-CM | POA: Insufficient documentation

## 2015-12-12 DIAGNOSIS — Z794 Long term (current) use of insulin: Secondary | ICD-10-CM | POA: Diagnosis not present

## 2015-12-12 DIAGNOSIS — I110 Hypertensive heart disease with heart failure: Secondary | ICD-10-CM | POA: Diagnosis not present

## 2015-12-12 DIAGNOSIS — E669 Obesity, unspecified: Secondary | ICD-10-CM | POA: Insufficient documentation

## 2015-12-12 DIAGNOSIS — I5032 Chronic diastolic (congestive) heart failure: Secondary | ICD-10-CM | POA: Diagnosis not present

## 2015-12-12 NOTE — Patient Instructions (Signed)
Your physician recommends that you schedule a follow-up appointment in: 6 weeks  

## 2015-12-12 NOTE — Progress Notes (Signed)
Patient ID: Destiny Shelton, female   DOB: [birth year], 70 y.o.   MRN: [id number]  PCP: Dr. Brigitte Pulse HF Cardiology: Dr. Aundra Dubin  70 yo with history of paroxysmal atrial fibrillation, chronic diastolic CHF with restrictive hemodynamics on 2013 RHC, COPD on home oxygen, inappropriate sinus tachycardia, and concern for intracardiac shunting presents for CHF clinic evaluation.  She has been followed for several years by both cardiology and pulmonology.  She is a prior smoker and has been diagnosed with COPD.  Interestingly, her PFTs in 10/16 showed improvement, suggesting mild obstructive airways disease.  However, she still requires home oxygen.  She had transcranial dopplers in 2013 suggesting a medium-sized right to left shunt.  This led to an extensive workup.  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  Cardiac MRI was unremarkable, no evidence for shunt lesion.  RHC/LHC showed no coronary disease but it did show restrictive hemodynamics and volume overload.  Qp/Qs from this study was 1.27/1, suggesting a relatively small left to right shunt.  A definite shunt lesion was never identified.    Echo in 2/17 showed normal LV size and systolic function and normal RV.  I also did a RHC in 2/17: on higher dose diuretics, she had mild pulmonary hypertension and normal PCWP.  There was no evidence for a shunt lesion with Qp/Qs 0.97.   She returns for HF followup. At last appointment, she was thought to be more volume overloaded and Lasix was increased.  Breathing is back to baseline.  Weight is down 2 lbs.  No wheezing.  She walks with a walker, has mild dyspnea after walking 50-100 feet.  This is her chronic pattern.  No BRBPR or melena.     Labs (9/16): digoxin 0.6, K 4.3, creatinine 0.85 Labs (1/17): K 3.9, creatinine 0.96, hgb 8.9 Labs (3/17): K 4.2, creatinine 0.96 Labs (6/17): K 4.2, creatinine 1.03  PMH: 1. Atrial fibrillation: Paroxysmal.  2. HTN 3. COPD: Home oxygen.  PFTs (10/16)  with FVC 77%, FEV1 68%, TLC 89% => mild obstruction (improved from the past).   4. Type II diabetes 5. Obesity 6. Inappropriate sinus tachycardia 7. OSA: Does not use CPAP, uses oxygen at night.  8. Chronic diastolic CHF: RHC/LHC in 0000000 with no significant CAD, mean RA 19, RV 46/18, PA 45/29 mean 37, mean PCWP 22, CI 4.1, PVR 1.4 WU => restrictive hemodynamics with no evidence for constriction;  Qp/Qs 1.27/1; saturation run difficult to interpret.  TEE (10/13): EF 55-60%, LVH, mild MR, bubble study showed no evidence for immediate R=>L bubble crossing, small amount of bubbles ended up in left heart > 5 seconds after injection suggesting possible intrapulmonary shunting; no shunt by color doppler.  Cardiac MRI (8/13) with EF 59%, no LGE, normal RV size/systolic function => interpreted as normal.  RHC 07/04/2015: RA mean 9, RV 45/10, PA 41/20 mean 30, PCWP mean 13, no step up on shunt run,cardiac output (Fick) 6.7 cardiac index (Fick) 3.3, PVR 2.5 WU, Qp/Qs = 0.97. Echo (2/17) with EF 55-60%, mild LVH, mild MR, normal RV size and systolic function.  9. ?Intracardiac shunt: Transcranial dopplers in 10/13 were suggestive of medium-sized right to left shunt.  Bubble study on TEE in 10/13 showed no evidence for immediate R=>L bubble crossing, small amount of bubbles ended up in left heart > 5 seconds after injection suggesting possible intrapulmonary shunting; no shunt by color doppler. Cardiac MRI showed no evidence for shunting.  Cardiac cath in 2013 actually was suggestive of a  small left to right shunt.  Cardiac cath in 2/17 NOT suggestive of significant shunt, Qp/Qs 0.97.   SH: Married, prior heavy smoker (quit 1996).  No ETOH.   Family History  Problem Relation Age of Onset  . Heart disease Mother   . Stroke Mother   . Heart disease Father   . Heart disease Brother   . Stroke Brother   . Skin cancer Brother   . Colon cancer Neg Hx   . Colon polyps Sister   . Diabetes Sister   . Diabetes Brother     . Irritable bowel syndrome Daughter    ROS: All systems reviewed and negative except as per HPI.   Current Outpatient Prescriptions  Medication Sig Dispense Refill  . acetaminophen (TYLENOL) 500 MG tablet 500 mg every 6 (six) hours as needed for mild pain. Take per bottle as needed for pain    . albuterol (PROVENTIL) (2.5 MG/3ML) 0.083% nebulizer solution Take 3 mLs (2.5 mg total) by nebulization every 6 (six) hours as needed for wheezing or shortness of breath. 75 mL 3  . ALPRAZolam (XANAX) 0.5 MG tablet Take 1/2 tablet by mouth daily as needed for anxiety    . AMBULATORY NON FORMULARY MEDICATION Continuous O2 @4LMP     . atorvastatin (LIPITOR) 40 MG tablet Take 1 tablet by mouth at bedtime.     . benzonatate (TESSALON) 200 MG capsule Take 400 mg by mouth at bedtime as needed for cough.     . budesonide-formoterol (SYMBICORT) 160-4.5 MCG/ACT inhaler Inhale 2 puffs into the lungs 2 (two) times daily.    Marland Kitchen buPROPion (WELLBUTRIN XL) 150 MG 24 hr tablet Take 300 mg by mouth every morning.     Marland Kitchen DALIRESP 500 MCG TABS tablet Take 1 tablet by mouth daily.  6  . docusate sodium (COLACE) 100 MG capsule Take 100 mg by mouth every morning.     . DULoxetine (CYMBALTA) 60 MG capsule Take 60 mg by mouth every morning.     . furosemide (LASIX) 40 MG tablet Take 80 mg by mouth 2 (two) times daily.    Marland Kitchen gabapentin (NEURONTIN) 600 MG tablet Take 600 mg by mouth 3 (three) times daily.     . insulin glargine (LANTUS) 100 UNIT/ML injection Inject 70 Units into the skin daily before breakfast. 10 mL   . insulin lispro (HUMALOG) 100 UNIT/ML injection Inject 8 Units into the skin 2 (two) times daily.    . irbesartan (AVAPRO) 150 MG tablet Take 1 tablet (150 mg total) by mouth daily. PLEASE CONTACT OFFICE FOR ADDITIONAL REFILLS 30 tablet 0  . iron polysaccharides (NIFEREX) 150 MG capsule Take 150 mg by mouth daily.    Marland Kitchen levalbuterol (XOPENEX HFA) 45 MCG/ACT inhaler Inhale 1-2 puffs into the lungs every 4 (four)  hours as needed for wheezing. 1 Inhaler 12  . Multiple Vitamin (MULTIVITAMIN) tablet Take 1 tablet by mouth every morning.     Marland Kitchen omeprazole (PRILOSEC) 20 MG capsule Take 20 mg by mouth daily before breakfast.     . ONE TOUCH ULTRA TEST test strip 1 each by Other route 3 (three) times daily.   4  . OXYGEN Increase oxygen to 6L with acitivity    . polyethylene glycol (MIRALAX / GLYCOLAX) packet Take 17 g by mouth daily.    . potassium chloride SA (K-DUR,KLOR-CON) 10 MEQ tablet Take 3 tablets (30 mEq total) by mouth 2 (two) times daily. 180 tablet 3  . rivaroxaban (XARELTO) 20 MG TABS tablet  Take 1 tablet (20 mg total) by mouth daily with supper. Restart on Wednesday May 11 , 2016 30 tablet   . SYMBICORT 160-4.5 MCG/ACT inhaler INHALE 2 PUFFS BY MOUTH TWICE DAILY 10.2 g 5  . tiotropium (SPIRIVA) 18 MCG inhalation capsule Place 1 capsule (18 mcg total) into inhaler and inhale every morning. 30 capsule 11  . verapamil (VERELAN PM) 360 MG 24 hr capsule TAKE ONE CAPSULE BY MOUTH DAILY 30 capsule 5  . HYDROcodone-acetaminophen (NORCO/VICODIN) 5-325 MG per tablet Take 1 tablet by mouth every 4 (four) hours as needed for moderate pain. Reported on 12/12/2015     No current facility-administered medications for this encounter.   BP 136/66 mmHg  Pulse 111  Wt 216 lb 12 oz (98.317 kg)  SpO2 94% General: NAD, obese, wearing oxygen.  Neck: JVP 7 cm, no thyromegaly or thyroid nodule.  Lungs: CTAB CV: Nondisplaced PMI.  Heart regular S1/S2, no S3/S4, 2/6 HSM LLSB.  Trace ankle edema.  No carotid bruit.  Normal pedal pulses.  Abdomen: Soft, nontender, no hepatosplenomegaly, no distention.  Skin: Intact without lesions or rashes.  Neurologic: Alert and oriented x 3.  Psych: Normal affect. Extremities: No clubbing or cyanosis.  HEENT: Normal.   Assessment/Plan: 1. COPD: On home oxygen, 5L at rest and 6L with exertion.  PFTs in 10/16 actually showed only mild obstruction (improved).  Breathing has improved  in the past with increased diuresis, suggesting that CHF plays a significant role in her symptoms in addition to COPD.  2. Atrial fibrillation: Paroxysmal, CHADSVASC = 4.  She remains in NSR today and has not felt atrial fibrillation recently.   - She will continue Xarelto.  - She can continue verapamil.     3. Chronic diastolic CHF: NYHA class III symptoms, likely due to combination of CHF and COPD.  Weight is down and she looks near-euvolemic.  - Continue Lasix 80 mg po bid.   4. ?Shunt lesion: She had transcranial dopplers in 2013 suggesting a medium-sized right to left shunt.  This led to an extensive workup.  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  Cardiac MRI was unremarkable, no evidence for shunt lesion.  RHC/LHC in 2013 showed no coronary disease but it did show restrictive hemodynamics and volume overload.  Qp/Qs from this study was 1.27/1, suggesting a relatively small left to right shunt.  A definite shunt lesion was never identified.  Based on prior workup, it seems most likely that the right to left shunting by transcranial dopplers and TEE bubble study was due to pulmonary AVMs.  RHC repeated in 2/17 did not show a significant shunt lesion, with Qp/Qs 0.97.  5. Inappropriate sinus tachycardia: HR around 100 today.  Had discussed using Corlanor in the past but interacts with verapamil.  6. Pulmonary hypertension: Mild pulmonary hypertension on RHC with PVR only 2.5 WU.  Probably due to COPD and elevated left atrial pressure.  No specific treatment other than diuresis.   Followup in 6 wks.   Loralie Champagne 12/12/2015

## 2015-12-16 ENCOUNTER — Other Ambulatory Visit: Payer: Self-pay | Admitting: Cardiology

## 2016-01-01 DIAGNOSIS — E1149 Type 2 diabetes mellitus with other diabetic neurological complication: Secondary | ICD-10-CM | POA: Diagnosis not present

## 2016-01-01 DIAGNOSIS — I10 Essential (primary) hypertension: Secondary | ICD-10-CM | POA: Diagnosis not present

## 2016-01-01 DIAGNOSIS — I4891 Unspecified atrial fibrillation: Secondary | ICD-10-CM | POA: Diagnosis not present

## 2016-01-01 DIAGNOSIS — I509 Heart failure, unspecified: Secondary | ICD-10-CM | POA: Diagnosis not present

## 2016-01-01 DIAGNOSIS — J9611 Chronic respiratory failure with hypoxia: Secondary | ICD-10-CM | POA: Diagnosis not present

## 2016-01-01 DIAGNOSIS — E784 Other hyperlipidemia: Secondary | ICD-10-CM | POA: Diagnosis not present

## 2016-01-01 DIAGNOSIS — D649 Anemia, unspecified: Secondary | ICD-10-CM | POA: Diagnosis not present

## 2016-01-01 DIAGNOSIS — E1129 Type 2 diabetes mellitus with other diabetic kidney complication: Secondary | ICD-10-CM | POA: Diagnosis not present

## 2016-01-01 DIAGNOSIS — Z7901 Long term (current) use of anticoagulants: Secondary | ICD-10-CM | POA: Diagnosis not present

## 2016-01-01 DIAGNOSIS — J449 Chronic obstructive pulmonary disease, unspecified: Secondary | ICD-10-CM | POA: Diagnosis not present

## 2016-01-01 DIAGNOSIS — Z6834 Body mass index (BMI) 34.0-34.9, adult: Secondary | ICD-10-CM | POA: Diagnosis not present

## 2016-01-01 LAB — FUNGUS CULTURE W SMEAR

## 2016-01-01 LAB — AFB CULTURE WITH SMEAR (NOT AT ARMC)

## 2016-01-09 DIAGNOSIS — Z6834 Body mass index (BMI) 34.0-34.9, adult: Secondary | ICD-10-CM | POA: Diagnosis not present

## 2016-01-09 DIAGNOSIS — Z79899 Other long term (current) drug therapy: Secondary | ICD-10-CM | POA: Diagnosis not present

## 2016-01-09 DIAGNOSIS — M810 Age-related osteoporosis without current pathological fracture: Secondary | ICD-10-CM | POA: Diagnosis not present

## 2016-01-11 ENCOUNTER — Other Ambulatory Visit: Payer: Self-pay | Admitting: Cardiology

## 2016-01-11 NOTE — Telephone Encounter (Signed)
REFILL 

## 2016-01-17 DIAGNOSIS — I10 Essential (primary) hypertension: Secondary | ICD-10-CM | POA: Diagnosis not present

## 2016-01-17 DIAGNOSIS — J449 Chronic obstructive pulmonary disease, unspecified: Secondary | ICD-10-CM | POA: Diagnosis not present

## 2016-01-17 DIAGNOSIS — S40812A Abrasion of left upper arm, initial encounter: Secondary | ICD-10-CM | POA: Diagnosis not present

## 2016-01-17 DIAGNOSIS — Z6835 Body mass index (BMI) 35.0-35.9, adult: Secondary | ICD-10-CM | POA: Diagnosis not present

## 2016-01-18 ENCOUNTER — Encounter (HOSPITAL_COMMUNITY): Payer: Medicare Other

## 2016-01-22 ENCOUNTER — Encounter (HOSPITAL_COMMUNITY): Payer: Self-pay

## 2016-01-22 ENCOUNTER — Emergency Department (HOSPITAL_COMMUNITY)
Admission: EM | Admit: 2016-01-22 | Discharge: 2016-01-22 | Discharge status: Absent without leave | Payer: Medicare Other | Source: Home / Self Care

## 2016-01-22 ENCOUNTER — Inpatient Hospital Stay (HOSPITAL_COMMUNITY)
Admission: EM | Admit: 2016-01-22 | Discharge: 2016-01-24 | DRG: 190 | Disposition: A | Discharge status: Home / Self Care | Payer: Medicare Other | Attending: Internal Medicine | Admitting: Internal Medicine

## 2016-01-22 ENCOUNTER — Emergency Department (HOSPITAL_COMMUNITY): Payer: Medicare Other

## 2016-01-22 DIAGNOSIS — J9610 Chronic respiratory failure, unspecified whether with hypoxia or hypercapnia: Secondary | ICD-10-CM | POA: Diagnosis present

## 2016-01-22 DIAGNOSIS — J441 Chronic obstructive pulmonary disease with (acute) exacerbation: Secondary | ICD-10-CM

## 2016-01-22 DIAGNOSIS — Z794 Long term (current) use of insulin: Secondary | ICD-10-CM

## 2016-01-22 DIAGNOSIS — I10 Essential (primary) hypertension: Secondary | ICD-10-CM | POA: Diagnosis present

## 2016-01-22 DIAGNOSIS — E1149 Type 2 diabetes mellitus with other diabetic neurological complication: Secondary | ICD-10-CM | POA: Diagnosis present

## 2016-01-22 DIAGNOSIS — G4730 Sleep apnea, unspecified: Secondary | ICD-10-CM | POA: Diagnosis present

## 2016-01-22 DIAGNOSIS — F329 Major depressive disorder, single episode, unspecified: Secondary | ICD-10-CM | POA: Diagnosis present

## 2016-01-22 DIAGNOSIS — J9620 Acute and chronic respiratory failure, unspecified whether with hypoxia or hypercapnia: Secondary | ICD-10-CM | POA: Diagnosis present

## 2016-01-22 DIAGNOSIS — L03115 Cellulitis of right lower limb: Secondary | ICD-10-CM | POA: Diagnosis not present

## 2016-01-22 DIAGNOSIS — M810 Age-related osteoporosis without current pathological fracture: Secondary | ICD-10-CM | POA: Diagnosis present

## 2016-01-22 DIAGNOSIS — Z79899 Other long term (current) drug therapy: Secondary | ICD-10-CM

## 2016-01-22 DIAGNOSIS — F419 Anxiety disorder, unspecified: Secondary | ICD-10-CM | POA: Diagnosis present

## 2016-01-22 DIAGNOSIS — M999 Biomechanical lesion, unspecified: Secondary | ICD-10-CM | POA: Diagnosis present

## 2016-01-22 DIAGNOSIS — F32A Depression, unspecified: Secondary | ICD-10-CM | POA: Diagnosis present

## 2016-01-22 DIAGNOSIS — S2231XA Fracture of one rib, right side, initial encounter for closed fracture: Secondary | ICD-10-CM | POA: Diagnosis not present

## 2016-01-22 DIAGNOSIS — R0781 Pleurodynia: Secondary | ICD-10-CM | POA: Diagnosis not present

## 2016-01-22 DIAGNOSIS — I5032 Chronic diastolic (congestive) heart failure: Secondary | ICD-10-CM | POA: Diagnosis not present

## 2016-01-22 DIAGNOSIS — I110 Hypertensive heart disease with heart failure: Secondary | ICD-10-CM | POA: Diagnosis present

## 2016-01-22 DIAGNOSIS — D509 Iron deficiency anemia, unspecified: Secondary | ICD-10-CM | POA: Diagnosis present

## 2016-01-22 DIAGNOSIS — Z7951 Long term (current) use of inhaled steroids: Secondary | ICD-10-CM

## 2016-01-22 DIAGNOSIS — K219 Gastro-esophageal reflux disease without esophagitis: Secondary | ICD-10-CM | POA: Diagnosis present

## 2016-01-22 DIAGNOSIS — I480 Paroxysmal atrial fibrillation: Secondary | ICD-10-CM | POA: Diagnosis present

## 2016-01-22 DIAGNOSIS — Z87891 Personal history of nicotine dependence: Secondary | ICD-10-CM

## 2016-01-22 DIAGNOSIS — D649 Anemia, unspecified: Secondary | ICD-10-CM | POA: Diagnosis present

## 2016-01-22 DIAGNOSIS — E785 Hyperlipidemia, unspecified: Secondary | ICD-10-CM | POA: Diagnosis present

## 2016-01-22 DIAGNOSIS — M797 Fibromyalgia: Secondary | ICD-10-CM | POA: Diagnosis present

## 2016-01-22 DIAGNOSIS — R0789 Other chest pain: Secondary | ICD-10-CM | POA: Diagnosis not present

## 2016-01-22 DIAGNOSIS — S299XXA Unspecified injury of thorax, initial encounter: Secondary | ICD-10-CM | POA: Diagnosis not present

## 2016-01-22 DIAGNOSIS — Z9981 Dependence on supplemental oxygen: Secondary | ICD-10-CM

## 2016-01-22 DIAGNOSIS — Z7901 Long term (current) use of anticoagulants: Secondary | ICD-10-CM

## 2016-01-22 DIAGNOSIS — E119 Type 2 diabetes mellitus without complications: Secondary | ICD-10-CM | POA: Diagnosis present

## 2016-01-22 LAB — CBC WITH DIFFERENTIAL/PLATELET
BASOS ABS: 0 10*3/uL (ref 0.0–0.1)
BASOS PCT: 0 %
Eosinophils Absolute: 0.2 10*3/uL (ref 0.0–0.7)
Eosinophils Relative: 2 %
HEMATOCRIT: 35.9 % — AB (ref 36.0–46.0)
HEMOGLOBIN: 10.6 g/dL — AB (ref 12.0–15.0)
LYMPHS PCT: 25 %
Lymphs Abs: 2.5 10*3/uL (ref 0.7–4.0)
MCH: 24.7 pg — ABNORMAL LOW (ref 26.0–34.0)
MCHC: 29.5 g/dL — ABNORMAL LOW (ref 30.0–36.0)
MCV: 83.5 fL (ref 78.0–100.0)
Monocytes Absolute: 1.1 10*3/uL — ABNORMAL HIGH (ref 0.1–1.0)
Monocytes Relative: 11 %
NEUTROS ABS: 6.2 10*3/uL (ref 1.7–7.7)
NEUTROS PCT: 62 %
Platelets: 274 10*3/uL (ref 150–400)
RBC: 4.3 MIL/uL (ref 3.87–5.11)
RDW: 16.8 % — AB (ref 11.5–15.5)
WBC: 10.1 10*3/uL (ref 4.0–10.5)

## 2016-01-22 LAB — BASIC METABOLIC PANEL
ANION GAP: 10 (ref 5–15)
BUN: 13 mg/dL (ref 6–20)
CALCIUM: 9.3 mg/dL (ref 8.9–10.3)
CHLORIDE: 96 mmol/L — AB (ref 101–111)
CO2: 29 mmol/L (ref 22–32)
Creatinine, Ser: 0.86 mg/dL (ref 0.44–1.00)
GFR calc non Af Amer: >60 mL/min (ref >60)
Glucose, Bld: 102 mg/dL — ABNORMAL HIGH (ref 65–99)
POTASSIUM: 3.5 mmol/L (ref 3.5–5.1)
Sodium: 135 mmol/L (ref 135–145)

## 2016-01-22 LAB — MAGNESIUM: MAGNESIUM: 2.3 mg/dL (ref 1.7–2.4)

## 2016-01-22 LAB — PHOSPHORUS: Phosphorus: 4.3 mg/dL (ref 2.5–4.6)

## 2016-01-22 LAB — TROPONIN I: Troponin I: <0.03 ng/mL (ref <0.03)

## 2016-01-22 LAB — BRAIN NATRIURETIC PEPTIDE: B NATRIURETIC PEPTIDE 5: 23.7 pg/mL (ref 0.0–100.0)

## 2016-01-22 LAB — GLUCOSE, CAPILLARY: GLUCOSE-CAPILLARY: 121 mg/dL — AB (ref 65–99)

## 2016-01-22 MED ORDER — IOPAMIDOL (ISOVUE-300) INJECTION 61%
INTRAVENOUS | Status: AC
  Administered 2016-01-22: 75 mL
  Filled 2016-01-22: qty 75

## 2016-01-22 MED ORDER — OXYCODONE-ACETAMINOPHEN 5-325 MG PO TABS
1.0000 | ORAL_TABLET | Freq: Once | ORAL | Status: AC
  Administered 2016-01-22: 1 via ORAL
  Filled 2016-01-22: qty 1

## 2016-01-22 MED ORDER — ONDANSETRON HCL 4 MG/2ML IJ SOLN: 4.0000 mg | Freq: Four times a day (QID) | INTRAMUSCULAR | Status: DC | PRN

## 2016-01-22 MED ORDER — IPRATROPIUM-ALBUTEROL 0.5-2.5 (3) MG/3ML IN SOLN
3.0000 mL | Freq: Once | RESPIRATORY_TRACT | Status: AC
  Administered 2016-01-22: 3 mL via RESPIRATORY_TRACT
  Filled 2016-01-22: qty 3

## 2016-01-22 MED ORDER — ALPRAZOLAM 0.25 MG PO TABS: 0.2500 mg | ORAL_TABLET | Freq: Three times a day (TID) | ORAL | Status: DC | PRN

## 2016-01-22 MED ORDER — POTASSIUM CHLORIDE CRYS ER 20 MEQ PO TBCR
30.0000 meq | EXTENDED_RELEASE_TABLET | Freq: Two times a day (BID) | ORAL | Status: DC
  Administered 2016-01-22 – 2016-01-24 (×4): 30 meq via ORAL
  Filled 2016-01-22 (×8): qty 1

## 2016-01-22 MED ORDER — GLUCOSE BLOOD VI STRP: 1.0000 | ORAL_STRIP | Freq: Three times a day (TID) | Status: DC

## 2016-01-22 MED ORDER — BENZONATATE 100 MG PO CAPS: 400.0000 mg | ORAL_CAPSULE | Freq: Every evening | ORAL | Status: DC | PRN

## 2016-01-22 MED ORDER — POLYSACCHARIDE IRON COMPLEX 150 MG PO CAPS
150.0000 mg | ORAL_CAPSULE | Freq: Every day | ORAL | Status: DC
  Administered 2016-01-23: 150 mg via ORAL
  Filled 2016-01-22: qty 1

## 2016-01-22 MED ORDER — INSULIN ASPART 100 UNIT/ML ~~LOC~~ SOLN
0.0000 [IU] | Freq: Three times a day (TID) | SUBCUTANEOUS | Status: DC
  Administered 2016-01-23: 3 [IU] via SUBCUTANEOUS
  Administered 2016-01-23: 4 [IU] via SUBCUTANEOUS
  Administered 2016-01-23 – 2016-01-24 (×2): 7 [IU] via SUBCUTANEOUS

## 2016-01-22 MED ORDER — MOMETASONE FURO-FORMOTEROL FUM 200-5 MCG/ACT IN AERO
2.0000 | INHALATION_SPRAY | Freq: Two times a day (BID) | RESPIRATORY_TRACT | Status: DC
  Administered 2016-01-22: 2 via RESPIRATORY_TRACT
  Filled 2016-01-22: qty 8.8

## 2016-01-22 MED ORDER — ALBUTEROL SULFATE (2.5 MG/3ML) 0.083% IN NEBU: 2.5000 mg | INHALATION_SOLUTION | Freq: Four times a day (QID) | RESPIRATORY_TRACT | Status: DC | PRN

## 2016-01-22 MED ORDER — BUPROPION HCL ER (XL) 150 MG PO TB24
300.0000 mg | ORAL_TABLET | ORAL | Status: DC
  Administered 2016-01-23 – 2016-01-24 (×2): 300 mg via ORAL
  Filled 2016-01-22 (×2): qty 2

## 2016-01-22 MED ORDER — RIVAROXABAN 20 MG PO TABS
20.0000 mg | ORAL_TABLET | Freq: Every day | ORAL | Status: DC
  Administered 2016-01-22 – 2016-01-24 (×3): 20 mg via ORAL
  Filled 2016-01-22 (×3): qty 1

## 2016-01-22 MED ORDER — DULOXETINE HCL 60 MG PO CPEP
60.0000 mg | ORAL_CAPSULE | Freq: Every morning | ORAL | Status: DC
  Administered 2016-01-23 – 2016-01-24 (×2): 60 mg via ORAL
  Filled 2016-01-22 (×2): qty 1

## 2016-01-22 MED ORDER — SODIUM CHLORIDE 0.9% FLUSH
3.0000 mL | Freq: Two times a day (BID) | INTRAVENOUS | Status: DC
  Administered 2016-01-22 – 2016-01-24 (×4): 3 mL via INTRAVENOUS

## 2016-01-22 MED ORDER — FUROSEMIDE 80 MG PO TABS
80.0000 mg | ORAL_TABLET | Freq: Two times a day (BID) | ORAL | Status: DC
  Administered 2016-01-23 – 2016-01-24 (×3): 80 mg via ORAL
  Filled 2016-01-22 (×4): qty 1

## 2016-01-22 MED ORDER — PANTOPRAZOLE SODIUM 40 MG PO TBEC
40.0000 mg | DELAYED_RELEASE_TABLET | Freq: Every day | ORAL | Status: DC
  Administered 2016-01-22 – 2016-01-24 (×3): 40 mg via ORAL
  Filled 2016-01-22 (×3): qty 1

## 2016-01-22 MED ORDER — HYDROCODONE-ACETAMINOPHEN 5-325 MG PO TABS
1.0000 | ORAL_TABLET | ORAL | Status: DC | PRN
  Administered 2016-01-23 – 2016-01-24 (×4): 1 via ORAL
  Filled 2016-01-22 (×4): qty 1

## 2016-01-22 MED ORDER — ACETAMINOPHEN 500 MG PO TABS: 500.0000 mg | ORAL_TABLET | Freq: Four times a day (QID) | ORAL | Status: DC | PRN

## 2016-01-22 MED ORDER — IRBESARTAN 300 MG PO TABS
150.0000 mg | ORAL_TABLET | Freq: Every day | ORAL | Status: DC
  Administered 2016-01-22 – 2016-01-24 (×3): 150 mg via ORAL
  Filled 2016-01-22 (×3): qty 1

## 2016-01-22 MED ORDER — GABAPENTIN 600 MG PO TABS
600.0000 mg | ORAL_TABLET | Freq: Three times a day (TID) | ORAL | Status: DC
  Administered 2016-01-22 – 2016-01-24 (×6): 600 mg via ORAL
  Filled 2016-01-22 (×6): qty 1

## 2016-01-22 MED ORDER — IPRATROPIUM-ALBUTEROL 0.5-2.5 (3) MG/3ML IN SOLN
3.0000 mL | Freq: Four times a day (QID) | RESPIRATORY_TRACT | Status: DC
  Administered 2016-01-22 – 2016-01-24 (×7): 3 mL via RESPIRATORY_TRACT
  Filled 2016-01-22 (×7): qty 3

## 2016-01-22 MED ORDER — DOCUSATE SODIUM 100 MG PO CAPS
100.0000 mg | ORAL_CAPSULE | ORAL | Status: DC
  Administered 2016-01-23: 100 mg via ORAL
  Filled 2016-01-22 (×3): qty 1

## 2016-01-22 MED ORDER — ROFLUMILAST 500 MCG PO TABS
500.0000 ug | ORAL_TABLET | Freq: Every day | ORAL | Status: DC
  Administered 2016-01-23 – 2016-01-24 (×2): 500 ug via ORAL
  Filled 2016-01-22 (×2): qty 1

## 2016-01-22 MED ORDER — FENTANYL CITRATE (PF) 100 MCG/2ML IJ SOLN
25.0000 ug | Freq: Once | INTRAMUSCULAR | Status: AC
  Administered 2016-01-22: 25 ug via INTRAVENOUS
  Filled 2016-01-22: qty 2

## 2016-01-22 MED ORDER — METHYLPREDNISOLONE SODIUM SUCC 40 MG IJ SOLR
40.0000 mg | Freq: Four times a day (QID) | INTRAMUSCULAR | Status: DC
  Administered 2016-01-22 – 2016-01-23 (×2): 40 mg via INTRAVENOUS
  Filled 2016-01-22 (×2): qty 1

## 2016-01-22 MED ORDER — VERAPAMIL HCL ER 120 MG PO TBCR
360.0000 mg | EXTENDED_RELEASE_TABLET | Freq: Every day | ORAL | Status: DC
  Administered 2016-01-23: 360 mg via ORAL
  Filled 2016-01-22 (×2): qty 3

## 2016-01-22 MED ORDER — LEVALBUTEROL HCL 0.63 MG/3ML IN NEBU: 0.6300 mg | INHALATION_SOLUTION | RESPIRATORY_TRACT | Status: DC | PRN

## 2016-01-22 MED ORDER — OXYCODONE-ACETAMINOPHEN 5-325 MG PO TABS
1.0000 | ORAL_TABLET | Freq: Four times a day (QID) | ORAL | Status: DC | PRN
  Administered 2016-01-23 – 2016-01-24 (×3): 1 via ORAL
  Filled 2016-01-22 (×3): qty 1

## 2016-01-22 MED ORDER — INSULIN GLARGINE 100 UNIT/ML ~~LOC~~ SOLN
66.0000 [IU] | Freq: Every day | SUBCUTANEOUS | Status: DC
  Administered 2016-01-23 – 2016-01-24 (×2): 66 [IU] via SUBCUTANEOUS
  Filled 2016-01-22 (×3): qty 0.66

## 2016-01-22 MED ORDER — POLYETHYLENE GLYCOL 3350 17 G PO PACK
17.0000 g | PACK | Freq: Every day | ORAL | Status: DC
  Filled 2016-01-22: qty 1

## 2016-01-22 MED ORDER — METHYLPREDNISOLONE SODIUM SUCC 125 MG IJ SOLR
125.0000 mg | Freq: Once | INTRAMUSCULAR | Status: AC
  Administered 2016-01-22: 125 mg via INTRAVENOUS
  Filled 2016-01-22: qty 2

## 2016-01-22 MED ORDER — ENOXAPARIN SODIUM 40 MG/0.4ML ~~LOC~~ SOLN: 40.0000 mg | SUBCUTANEOUS | Status: DC

## 2016-01-22 MED ORDER — ATORVASTATIN CALCIUM 40 MG PO TABS
40.0000 mg | ORAL_TABLET | Freq: Every day | ORAL | Status: DC
  Administered 2016-01-22 – 2016-01-23 (×2): 40 mg via ORAL
  Filled 2016-01-22 (×2): qty 1

## 2016-01-22 MED ORDER — OXYCODONE-ACETAMINOPHEN 5-325 MG PO TABS
1.0000 | ORAL_TABLET | Freq: Once | ORAL | Status: AC
  Administered 2016-01-22: 1 via ORAL

## 2016-01-22 MED ORDER — ADULT MULTIVITAMIN W/MINERALS CH
1.0000 | ORAL_TABLET | Freq: Every morning | ORAL | Status: DC
  Administered 2016-01-23 – 2016-01-24 (×2): 1 via ORAL
  Filled 2016-01-22 (×2): qty 1

## 2016-01-22 MED ORDER — ONDANSETRON HCL 4 MG PO TABS: 4.0000 mg | ORAL_TABLET | Freq: Four times a day (QID) | ORAL | Status: DC | PRN

## 2016-01-22 MED ORDER — SODIUM CHLORIDE 0.9 % IV BOLUS (SEPSIS)
500.0000 mL | Freq: Once | INTRAVENOUS | Status: AC
Start: 2016-01-22 — End: 2016-01-22
  Administered 2016-01-22: 500 mL via INTRAVENOUS

## 2016-01-22 MED ORDER — TIOTROPIUM BROMIDE MONOHYDRATE 18 MCG IN CAPS
18.0000 ug | ORAL_CAPSULE | RESPIRATORY_TRACT | Status: DC
  Administered 2016-01-23: 18 ug via RESPIRATORY_TRACT
  Filled 2016-01-22: qty 5

## 2016-01-22 NOTE — ED Notes (Signed)
Called service response to have meal tray delivered to [id number]

## 2016-01-22 NOTE — ED Provider Notes (Signed)
[facility] DEPT Provider Note   CSN: [id number] Arrival date & time: 01/22/16  1056     History   Chief Complaint Chief Complaint  Patient presents with  . Fall  . Rib Injury    HPI Destiny Shelton is a 70 y.o. female.  HPI  History of Dm, HTN, Obseity, pHTN, pAF, chronic respiratory failure, CHF, who presents for a fall.    This patient has chronic weakness with recurrent falls, and is supposed to use a walker.  She was up walking without her walker on Sunday when she states "my legs gave out" and she fell onto her left side against a dresser.  She has had pain in the left chest since then, sharp, 10/10, worse with deep breath, and mildly improved with Vicodin.    Past Medical History:  Diagnosis Date  . Adenomatous colon polyp   . ALLERGIC RHINITIS   . Anemia    iron deficient  . Anxiety   . Atrial tachycardia (HCC)    Mostly Sinus Tachycardia  . Back pain, chronic   . Carotid stenosis   . Community acquired pneumonia - Recent Admission (07/2013) 07/08/2013  . COPD (chronic obstructive pulmonary disease) ([place])    Requiring Home O2 at 4L  . Depression   . Diabetes mellitus type 2 with neurological manifestations ([place])    On Insulin  . Diverticulosis of colon   . External hemorrhoids   . Fibromyalgia    "pain in arms and shoulders."  . Gastritis   . GERD (gastroesophageal reflux disease)   . Headache(784.0)    tension  . Hyperlipemia   . Hypertension   . Inappropriate sinus tachycardia ([place])   . Morbidly obese ([place])   . Nephrolithiasis   . Neuromuscular disorder ([place])    diabetic neuropathy  . Osteoarthritis   . Osteoporosis   . PAF (paroxysmal atrial fibrillation) ([place])   . 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  . Sleep apnea    no cpap machine  02 at 4l/min all the time  . Urosepsis 05/26/2012    Patient Active Problem List   Diagnosis Date Noted  . Chronic diastolic CHF (congestive heart failure) ([place]) 12/12/2015  . Morbid obesity ([place]) 12/29/2014  . Benign  neoplasm of sigmoid colon   . Chronic diastolic heart failure of unknown etiology ([place]) 08/17/2013  . Chronic respiratory failure ([place]) 04/21/2013  . Polyposis coli-attenuated 01/24/2013  . Sigmoid tubular adenoma with high-grade dysplasia 10/01/2012  . Gastric polyp 10/01/2012  . Schatzki's ring 10/01/2012  . PAF (paroxysmal atrial fibrillation) ([place]) 05/29/2012  . Pulmonary hypertension ([place]) 04/15/2012  . Inappropriate sinus tachycardia ([place])   . Obesity, Class II, BMI 35-39.9, with comorbidity (HCC)     Class: Chronic  . History of failed moderate sedation 06/10/2011  . Pulmonary nodules 09/11/2010  . COPD GOLD II  09/11/2010  . CHRONIC DYSPNEA 07/26/2009  . Diabetes mellitus type 2 with neurological manifestations ([place]) 07/29/2007    Class: Diagnosis of  . DIABETIC PERIPHERAL NEUROPATHY 07/29/2007  . ANXIETY 07/29/2007  . DEPRESSION 07/29/2007  . Essential hypertension 07/29/2007  . NEPHROLITHIASIS 07/29/2007  . OSTEOARTHRITIS 07/29/2007  . BACK PAIN, CHRONIC 07/29/2007  . OSTEOPOROSIS 07/29/2007  . Personal history of colonic polyps 04/28/2007    Past Surgical History:  Procedure Laterality Date  . ABDOMINAL HYSTERECTOMY  1978  . APPENDECTOMY  1963  . CARDIAC CATHETERIZATION N/A 07/04/2015   Procedure: Right Heart Cath;  Surgeon: Larey Dresser, MD;  Location: [facility];  Service: Cardiovascular;  Laterality: N/A;  . CHOLECYSTECTOMY  1963  . COLONOSCOPY N/A 12/21/2012   Procedure: COLONOSCOPY;  Surgeon: Gatha Mayer, MD;  Location: [facility];  Service: Endoscopy;  Laterality: N/A;  . COLONOSCOPY N/A 11/17/2013   Procedure: COLONOSCOPY;  Surgeon: Gatha Mayer, MD;  Location: [facility];  Service: Endoscopy;  Laterality: N/A;  . COLONOSCOPY W/ BIOPSIES AND POLYPECTOMY  07/22/2007, 04/28/2007   2009: diverticulosis, 2008: diverticulosis, adenomatous polyps  . COLONOSCOPY WITH PROPOFOL N/A 10/01/2012   Procedure: COLONOSCOPY WITH PROPOFOL;  Surgeon: Gatha Mayer, MD;  Location: [facility];  Service: Endoscopy;  Laterality: N/A;  . COLONOSCOPY WITH PROPOFOL N/A 10/10/2014   Procedure: COLONOSCOPY WITH PROPOFOL;  Surgeon: Gatha Mayer, MD;  Location: [facility];  Service: Endoscopy;  Laterality: N/A;  . ESOPHAGOGASTRODUODENOSCOPY (EGD) WITH PROPOFOL N/A 10/01/2012   Procedure: ESOPHAGOGASTRODUODENOSCOPY (EGD) WITH PROPOFOL;  Surgeon: Gatha Mayer, MD;  Location: [facility];  Service: Endoscopy;  Laterality: N/A;  . HOT HEMOSTASIS N/A 12/21/2012   Procedure: HOT HEMOSTASIS (ARGON PLASMA COAGULATION/BICAP);  Surgeon: Gatha Mayer, MD;  Location: [facility];  Service: Endoscopy;  Laterality: N/A;  . HOT HEMOSTASIS N/A 10/10/2014   Procedure: HOT HEMOSTASIS (ARGON PLASMA COAGULATION/BICAP);  Surgeon: Gatha Mayer, MD;  Location: [facility];  Service: Endoscopy;  Laterality: N/A;  . LEFT AND RIGHT HEART CATHETERIZATION WITH CORONARY ANGIOGRAM  12/19/2011   Procedure: LEFT AND RIGHT HEART CATHETERIZATION WITH CORONARY ANGIOGRAM;  Surgeon: Leonie Man, MD;  Location: [facility] CATH LAB;  Service: Cardiovascular;;  . RIGHT AND LEFT CARDIAC CATHETERIZATION  July 2013   RAP: 22 mmHg, and RVP: 46/16 mmHg, RV EDP 18; L. BP 110/11 mmHg, LVEDP 20 mmHg;  PAP 45/29 mmHg (mean 37 mmHg); PCWP 29/22 mmHg - 26 mmHg; no significant coronary disease  . TEE WITHOUT CARDIOVERSION  03/24/2012   Procedure: TRANSESOPHAGEAL ECHOCARDIOGRAM (TEE);  Surgeon: Pixie Casino, MD;  Location: [facility] OR;  Service: Cardiovascular;  Laterality: N/A;  TEE with Bubble  . TOTAL KNEE ARTHROPLASTY  1997   right  . UPPER GASTROINTESTINAL ENDOSCOPY  04/09/2006   w/Dilation, gastritis    OB History    No data available       Home Medications    Prior to Admission medications   Medication Sig Start Date End Date Taking? Authorizing Provider  acetaminophen (TYLENOL) 500 MG tablet 500 mg every 6 (six) hours as needed for mild pain. Take per bottle as needed for pain    Historical  Provider, MD  albuterol (PROVENTIL) (2.5 MG/3ML) 0.083% nebulizer solution Take 3 mLs (2.5 mg total) by nebulization every 6 (six) hours as needed for wheezing or shortness of breath. 06/28/14   Kathee Delton, MD  ALPRAZolam Duanne Moron) 0.5 MG tablet Take 1/2 tablet by mouth daily as needed for anxiety    Historical Provider, MD  AMBULATORY NON FORMULARY MEDICATION Continuous O2 @4LMP     Historical Provider, MD  atorvastatin (LIPITOR) 40 MG tablet Take 1 tablet by mouth at bedtime.  06/06/13   Historical Provider, MD  benzonatate (TESSALON) 200 MG capsule Take 400 mg by mouth at bedtime as needed for cough.     Historical Provider, MD  budesonide-formoterol (SYMBICORT) 160-4.5 MCG/ACT inhaler Inhale 2 puffs into the lungs 2 (two) times daily.    Historical Provider, MD  buPROPion (WELLBUTRIN XL) 150 MG 24 hr tablet Take 300 mg by mouth every morning.     Historical Provider, MD  DALIRESP 500 MCG TABS tablet Take  1 tablet by mouth daily. 07/04/15   Historical Provider, MD  docusate sodium (COLACE) 100 MG capsule Take 100 mg by mouth every morning.     Historical Provider, MD  DULoxetine (CYMBALTA) 60 MG capsule Take 60 mg by mouth every morning.     Historical Provider, MD  furosemide (LASIX) 40 MG tablet Take 80 mg by mouth 2 (two) times daily. 07/13/13   Marton Redwood, MD  gabapentin (NEURONTIN) 600 MG tablet Take 600 mg by mouth 3 (three) times daily.     Historical Provider, MD  HYDROcodone-acetaminophen (NORCO/VICODIN) 5-325 MG per tablet Take 1 tablet by mouth every 4 (four) hours as needed for moderate pain. Reported on 12/12/2015    Historical Provider, MD  insulin glargine (LANTUS) 100 UNIT/ML injection Inject 70 Units into the skin daily before breakfast. 06/01/12   Marton Redwood, MD  insulin lispro (HUMALOG) 100 UNIT/ML injection Inject 8 Units into the skin 2 (two) times daily.    Historical Provider, MD  irbesartan (AVAPRO) 150 MG tablet Take 1 tablet (150 mg total) by mouth daily. KEEP OV. 01/11/16    Leonie Man, MD  iron polysaccharides (NIFEREX) 150 MG capsule Take 150 mg by mouth daily.    Historical Provider, MD  levalbuterol [facility] HFA) 45 MCG/ACT inhaler Inhale 1-2 puffs into the lungs every 4 (four) hours as needed for wheezing. 06/01/12   Marton Redwood, MD  Multiple Vitamin (MULTIVITAMIN) tablet Take 1 tablet by mouth every morning.     Historical Provider, MD  omeprazole (PRILOSEC) 20 MG capsule Take 20 mg by mouth daily before breakfast.     Historical Provider, MD  ONE TOUCH ULTRA TEST test strip 1 each by Other route 3 (three) times daily.  07/10/14   Historical Provider, MD  OXYGEN Increase oxygen to 6L with acitivity    Historical Provider, MD  polyethylene glycol (MIRALAX / GLYCOLAX) packet Take 17 g by mouth daily.    Historical Provider, MD  potassium chloride SA (K-DUR,KLOR-CON) 10 MEQ tablet Take 3 tablets (30 mEq total) by mouth 2 (two) times daily. 11/19/15   Larey Dresser, MD  rivaroxaban (XARELTO) 20 MG TABS tablet Take 1 tablet (20 mg total) by mouth daily with supper. Restart on Wednesday May 11 , 2016 10/10/14   Gatha Mayer, MD  SYMBICORT 160-4.5 MCG/ACT inhaler INHALE 2 PUFFS BY MOUTH TWICE DAILY 11/12/15   Juanito Doom, MD  tiotropium (SPIRIVA) 18 MCG inhalation capsule Place 1 capsule (18 mcg total) into inhaler and inhale every morning. 08/10/15   Juanito Doom, MD  verapamil (VERELAN PM) 360 MG 24 hr capsule TAKE ONE CAPSULE BY MOUTH DAILY 12/17/15   Leonie Man, MD    Family History Family History  Problem Relation Age of Onset  . Heart disease Mother   . Stroke Mother   . Heart disease Father   . Heart disease Brother   . Stroke Brother   . Skin cancer Brother   . Colon polyps Sister   . Diabetes Sister   . Diabetes Brother   . Irritable bowel syndrome Daughter   . Colon cancer Neg Hx     Social History Social History  Substance Use Topics  . Smoking status: Former Smoker    Packs/day: 2.00    Years: 30.00    Types: Cigarettes     Quit date: 06/02/1994  . Smokeless tobacco: Never Used  . Alcohol use No     Allergies   Aspirin and Nsaids  Review of Systems Review of Systems  Constitutional: Negative for chills and fever.  HENT: Negative for ear pain and sore throat.   Eyes: Negative for pain and visual disturbance.  Respiratory: Positive for shortness of breath. Negative for cough.   Cardiovascular: Positive for chest pain. Negative for palpitations.  Gastrointestinal: Negative for abdominal pain and vomiting.  Genitourinary: Negative for dysuria and hematuria.  Musculoskeletal: Negative for arthralgias and back pain.  Skin: Negative for color change and rash.  Neurological: Negative for seizures and syncope.  All other systems reviewed and are negative.    Physical Exam Updated Vital Signs BP 120/55 (BP Location: Right Arm)   Pulse 105   Temp 98 F (36.7 C) (Oral)   Resp 22   Ht 5\' 5"  (1.651 m)   Wt 98.4 kg   SpO2 91%   BMI 36.11 kg/m   Physical Exam  Constitutional: She appears well-developed and well-nourished. No distress.  HENT:  Head: Normocephalic and atraumatic.  Eyes: Conjunctivae are normal.  Neck: Neck supple.  Cardiovascular: Normal rate and regular rhythm.   No murmur heard. Pulmonary/Chest: Breath sounds normal. Tachypnea noted. She is in respiratory distress. She exhibits tenderness.  Ambulatory O2 sats 60%   Abdominal: Soft. There is no tenderness.  Musculoskeletal: She exhibits no edema.  Neurological: She is alert.  Skin: Skin is warm and dry.  Psychiatric: She has a normal mood and affect.  Nursing note and vitals reviewed.    ED Treatments / Results  Labs (all labs ordered are listed, but only abnormal results are displayed) Labs Reviewed - No data to display  EKG  EKG Interpretation None       Radiology Dg Ribs Unilateral W/chest Left  Result Date: 01/22/2016 CLINICAL DATA:  Fall 3 days ago, left rib pain EXAM: LEFT RIBS AND CHEST - 3+ VIEW  COMPARISON:  12/28/2014 FINDINGS: Three views left ribs submitted. Study is limited by diffuse osteopenia. Mild perihilar bronchitic changes. Streaky bilateral basilar atelectasis, scarring or infiltrate. Question nondisplaced fracture of the left seventh rib. Question nondisplaced fracture of the left 8 rib. No pneumothorax. IMPRESSION: Study is limited by diffuse osteopenia. Mild perihilar bronchitic changes. Streaky bilateral basilar atelectasis, scarring or infiltrate. Question nondisplaced fracture of the left seventh rib. Question nondisplaced fracture of the left 8 rib. No pneumothorax. Electronically Signed   By: Lahoma Crocker M.D.   On: 01/22/2016 11:57    Procedures Procedures (including critical care time)  Medications Ordered in ED Medications - No data to display   Initial Impression / Assessment and Plan / ED Course  I have reviewed the triage vital signs and the nursing notes.  Pertinent labs & imaging results that were available during my care of the patient were reviewed by me and considered in my medical decision making (see chart for details).  Clinical Course    Patient with story consistent with mechanical fall in setting of not using required DME.  She has significant desaturations when ambulatory (60s) on her oxygen.  Resting desaturations on home 4L to 80s, and is requiring 6L at rest now.  She has tenderness over the left rib cage concerning for bruising. Scant wheezes on exam.  Concern for COPD exacerbation due to left rib cage bruising and lack of deep breathes.  Given steroids and duoneb.  Labs show stable anemia and negative troponin.  Will admit to hospitalist for further care given her new oxygen requirement.  Final Clinical Impressions(s) / ED Diagnoses   Final diagnoses:  None    New Prescriptions New Prescriptions   No medications on file     Levada Schilling, MD 01/22/16 [place], MD 01/26/16 2102

## 2016-01-22 NOTE — ED Triage Notes (Signed)
Pt on oxygen 4L via  when sitting and if she is up moving around she increases to 6L.

## 2016-01-22 NOTE — ED Triage Notes (Signed)
Onset early morning 01-20-16 pt fell in bathroom hitting left rib cage on toilet   Pt reports recent falls because knees get weak and give out.  Pt uses walker and a cane but did not use this time as she was in hurry to get to bathroom.

## 2016-01-22 NOTE — ED Notes (Signed)
Meal tray ordered 

## 2016-01-22 NOTE — H&P (Addendum)
History and Physical    [name] DOB: 11/16/1945 DOA: 01/22/2016  PCP: Marton Redwood, MD   Patient coming from: Home.  Chief Complaint: Chest wall pain.  HPI: Destiny Shelton is a 70 y.o. female with medical history significant of COPD on home oxygen usually at 4 LPM, type 2 diabetes, anemia, anxiety, paroxysmal atrial fibrillation, carotid stenosis, depression, diverticulosis, fibromyalgia, GERD, gastritis, hyperlipidemia, hypertension, osteoarthritis, osteoporosis, sleep apnea not on CPAP who came to the emergency department due to left-sided chest wall pain.   Per patient, her symptoms started after sustaining direct trauma on her left-sided chest wall during a mechanical fall at home 2 days ago. She states that she has been having worsening dyspnea secondary to shallow respirations. Her O2 sat was in the 80s at home on 4 LPM and her oxygen requirement now is 6 LPM. She reports wheezing and chronic cough. She denies precordial chest pain, palpitations, diaphoresis, pitting edema of the lower extremities.   ED Course: The patient improved significantly with bronchodilators and corticosteroids, but unfortunately her O2 saturation decreased to the 60s when ambulating. Workup shows that hemoglobin level 10.6 g/dL,  chest radiograph showed atelectasis and mild perihilar bronchitic changes. CT angiogram chest did not show evidence of PE.  Review of Systems: As per HPI otherwise 10 point review of systems negative.    Past Medical History:  Diagnosis Date  . Adenomatous colon polyp   . ALLERGIC RHINITIS   . Anemia    iron deficient  . Anxiety   . Atrial tachycardia (HCC)    Mostly Sinus Tachycardia  . Back pain, chronic   . Carotid stenosis   . Community acquired pneumonia - Recent Admission (07/2013) 07/08/2013  . COPD (chronic obstructive pulmonary disease) ([place])    Requiring Home O2 at 4L  . Depression   . Diabetes mellitus type 2 with neurological manifestations  ([place])    On Insulin  . Diverticulosis of colon   . External hemorrhoids   . Fibromyalgia    "pain in arms and shoulders."  . Gastritis   . GERD (gastroesophageal reflux disease)   . Headache(784.0)    tension  . Hyperlipemia   . Hypertension   . Inappropriate sinus tachycardia ([place])   . Morbidly obese ([place])   . Nephrolithiasis   . Neuromuscular disorder ([place])    diabetic neuropathy  . Osteoarthritis   . Osteoporosis   . PAF (paroxysmal atrial fibrillation) ([place])   . 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  . Sleep apnea    no cpap machine  02 at 4l/min all the time  . Urosepsis 05/26/2012    Past Surgical History:  Procedure Laterality Date  . ABDOMINAL HYSTERECTOMY  1978  . APPENDECTOMY  1963  . CARDIAC CATHETERIZATION N/A 07/04/2015   Procedure: Right Heart Cath;  Surgeon: Larey Dresser, MD;  Location: [place] [facility];  Service: Cardiovascular;  Laterality: N/A;  . CHOLECYSTECTOMY  1963  . COLONOSCOPY N/A 12/21/2012   Procedure: COLONOSCOPY;  Surgeon: Gatha Mayer, MD;  Location: [facility];  Service: Endoscopy;  Laterality: N/A;  . COLONOSCOPY N/A 11/17/2013   Procedure: COLONOSCOPY;  Surgeon: Gatha Mayer, MD;  Location: [facility];  Service: Endoscopy;  Laterality: N/A;  . COLONOSCOPY W/ BIOPSIES AND POLYPECTOMY  07/22/2007, 04/28/2007   2009: diverticulosis, 2008: diverticulosis, adenomatous polyps  . COLONOSCOPY WITH PROPOFOL N/A 10/01/2012   Procedure: COLONOSCOPY WITH PROPOFOL;  Surgeon: Gatha Mayer, MD;  Location: [facility];  Service: Endoscopy;  Laterality:  N/A;  . COLONOSCOPY WITH PROPOFOL N/A 10/10/2014   Procedure: COLONOSCOPY WITH PROPOFOL;  Surgeon: Gatha Mayer, MD;  Location: [facility];  Service: Endoscopy;  Laterality: N/A;  . ESOPHAGOGASTRODUODENOSCOPY (EGD) WITH PROPOFOL N/A 10/01/2012   Procedure: ESOPHAGOGASTRODUODENOSCOPY (EGD) WITH PROPOFOL;  Surgeon: Gatha Mayer, MD;  Location: [facility];  Service: Endoscopy;  Laterality: N/A;  . HOT  HEMOSTASIS N/A 12/21/2012   Procedure: HOT HEMOSTASIS (ARGON PLASMA COAGULATION/BICAP);  Surgeon: Gatha Mayer, MD;  Location: [facility];  Service: Endoscopy;  Laterality: N/A;  . HOT HEMOSTASIS N/A 10/10/2014   Procedure: HOT HEMOSTASIS (ARGON PLASMA COAGULATION/BICAP);  Surgeon: Gatha Mayer, MD;  Location: [facility];  Service: Endoscopy;  Laterality: N/A;  . LEFT AND RIGHT HEART CATHETERIZATION WITH CORONARY ANGIOGRAM  12/19/2011   Procedure: LEFT AND RIGHT HEART CATHETERIZATION WITH CORONARY ANGIOGRAM;  Surgeon: Leonie Man, MD;  Location: [facility] CATH LAB;  Service: Cardiovascular;;  . RIGHT AND LEFT CARDIAC CATHETERIZATION  July 2013   RAP: 22 mmHg, and RVP: 46/16 mmHg, RV EDP 18; L. BP 110/11 mmHg, LVEDP 20 mmHg;  PAP 45/29 mmHg (mean 37 mmHg); PCWP 29/22 mmHg - 26 mmHg; no significant coronary disease  . TEE WITHOUT CARDIOVERSION  03/24/2012   Procedure: TRANSESOPHAGEAL ECHOCARDIOGRAM (TEE);  Surgeon: Pixie Casino, MD;  Location: [facility] OR;  Service: Cardiovascular;  Laterality: N/A;  TEE with [place]  . TOTAL KNEE ARTHROPLASTY  1997   right  . UPPER GASTROINTESTINAL ENDOSCOPY  04/09/2006   w/Dilation, gastritis     reports that she quit smoking about 21 years ago. Her smoking use included Cigarettes. She has a 60.00 pack-year smoking history. She has never used smokeless tobacco. She reports that she does not drink alcohol or use drugs.  Allergies  Allergen Reactions  . Aspirin Shortness Of Breath  . Nsaids Shortness Of Breath    Family History  Problem Relation Age of Onset  . Heart disease Mother   . Stroke Mother   . Heart disease Father   . Heart disease Brother   . Stroke Brother   . Skin cancer Brother   . Colon polyps Sister   . Diabetes Sister   . Diabetes Brother   . Irritable bowel syndrome Daughter   . Colon cancer Neg Hx      Prior to Admission medications   Medication Sig Start Date End Date Taking? Authorizing Provider  acetaminophen (TYLENOL) 500  MG tablet 500 mg every 6 (six) hours as needed for mild pain. Take per bottle as needed for pain   Yes Historical Provider, MD  albuterol (PROVENTIL) (2.5 MG/3ML) 0.083% nebulizer solution Take 3 mLs (2.5 mg total) by nebulization every 6 (six) hours as needed for wheezing or shortness of breath. 06/28/14  Yes Kathee Delton, MD  ALPRAZolam Duanne Moron) 0.5 MG tablet Take 1/2 tablet by mouth daily as needed for anxiety   Yes Historical Provider, MD  [place] Continuous O2 @4LMP    Yes Historical Provider, MD  atorvastatin (LIPITOR) 40 MG tablet Take 1 tablet by mouth at bedtime.  06/06/13  Yes Historical Provider, MD  benzonatate (TESSALON) 200 MG capsule Take 400 mg by mouth at bedtime as needed for cough.    Yes Historical Provider, MD  buPROPion (WELLBUTRIN XL) 150 MG 24 hr tablet Take 300 mg by mouth every morning.    Yes Historical Provider, MD  DALIRESP 500 MCG TABS tablet Take 1 tablet by mouth daily. 07/04/15  Yes Historical Provider, MD  docusate sodium (COLACE) 100 MG capsule Take 100 mg by mouth every morning.    Yes Historical Provider, MD  DULoxetine (CYMBALTA) 60 MG capsule Take 60 mg by mouth every morning.    Yes Historical Provider, MD  furosemide (LASIX) 40 MG tablet Take 80 mg by mouth 2 (two) times daily. 07/13/13  Yes Marton Redwood, MD  gabapentin (NEURONTIN) 600 MG tablet Take 600 mg by mouth 3 (three) times daily.    Yes Historical Provider, MD  HYDROcodone-acetaminophen (NORCO/VICODIN) 5-325 MG per tablet Take 1 tablet by mouth every 4 (four) hours as needed for moderate pain. Reported on 12/12/2015   Yes Historical Provider, MD  insulin glargine (LANTUS) 100 UNIT/ML injection Inject 70 Units into the skin daily before breakfast. Patient taking differently: Inject 66 Units into the skin daily before breakfast.  06/01/12  Yes Marton Redwood, MD  insulin lispro (HUMALOG) 100 UNIT/ML injection Inject 8 Units into the skin 2 (two) times daily.   Yes Historical Provider,  MD  irbesartan (AVAPRO) 150 MG tablet Take 1 tablet (150 mg total) by mouth daily. KEEP OV. 01/11/16  Yes Leonie Man, MD  iron polysaccharides (NIFEREX) 150 MG capsule Take 150 mg by mouth daily.   Yes Historical Provider, MD  levalbuterol [facility] HFA) 45 MCG/ACT inhaler Inhale 1-2 puffs into the lungs every 4 (four) hours as needed for wheezing. 06/01/12  Yes Marton Redwood, MD  Multiple Vitamin (MULTIVITAMIN) tablet Take 1 tablet by mouth every morning.    Yes Historical Provider, MD  omeprazole (PRILOSEC) 20 MG capsule Take 20 mg by mouth daily before breakfast.    Yes Historical Provider, MD  ONE TOUCH ULTRA TEST test strip 1 each by Other route 3 (three) times daily.  07/10/14  Yes Historical Provider, MD  OXYGEN 4 L at baseline, Increase oxygen to 6L with acitivity   Yes Historical Provider, MD  polyethylene glycol (MIRALAX / GLYCOLAX) packet Take 17 g by mouth daily.   Yes Historical Provider, MD  potassium chloride SA (K-DUR,KLOR-CON) 10 MEQ tablet Take 3 tablets (30 mEq total) by mouth 2 (two) times daily. 11/19/15  Yes Larey Dresser, MD  rivaroxaban (XARELTO) 20 MG TABS tablet Take 1 tablet (20 mg total) by mouth daily with supper. Restart on Wednesday May 11 , 2016 10/10/14  Yes Gatha Mayer, MD  SYMBICORT 160-4.5 MCG/ACT inhaler INHALE 2 PUFFS BY MOUTH TWICE DAILY 11/12/15  Yes Juanito Doom, MD  tiotropium (SPIRIVA) 18 MCG inhalation capsule Place 1 capsule (18 mcg total) into inhaler and inhale every morning. 08/10/15  Yes Juanito Doom, MD  verapamil (VERELAN PM) 360 MG 24 hr capsule TAKE ONE CAPSULE BY MOUTH DAILY 12/17/15  Yes Leonie Man, MD     Constitutional: NAD, calm, comfortable. Vitals:   01/22/16 1830 01/22/16 1845 01/22/16 1900 01/22/16 1945  BP: (!) 108/45 (!) 111/46 (!) 107/51 (!) 100/52  Pulse: 94 90 89 86  Resp:      Temp:      TempSrc:      SpO2: 92% 91% 92% 92%  Weight:      Height:       Eyes: PERRL, lids and conjunctivae normal ENMT: Nasal  cannula in place. Mucous membranes are moist. Posterior pharynx clear of any exudate or lesions.  Neck: normal, supple, no masses, no thyromegaly Respiratory: Decreased breath sounds and wheezing bilaterally. No accessory muscle use. Cardiovascular: Regular rate and rhythm, no murmurs / rubs / gallops. No extremity edema. 2+ pedal  pulses. No carotid bruits.  Abdomen:  Bowel sounds positive. no tenderness, no masses palpated. No hepatosplenomegaly. Musculoskeletal: no clubbing / cyanosis. No joint deformity upper and lower extremities. Good ROM, no contractures. Normal muscle tone.  Skin: Multiple areas of ecchymosis on extremities. Neurologic: CN 2-12 grossly intact. Sensation intact, DTR normal. Strength 5/5 in all 4.  Psychiatric: Normal judgment and insight. Alert and oriented x 4. Normal mood.     Labs on Admission: I have personally reviewed following labs and imaging studies  CBC:  Recent Labs Lab 01/22/16 1635  WBC 10.1  NEUTROABS 6.2  HGB 10.6*  HCT 35.9*  MCV 83.5  PLT 123456   Basic Metabolic Panel:  Recent Labs Lab 01/22/16 1635  NA 135  K 3.5  CL 96*  CO2 29  GLUCOSE 102*  BUN 13  CREATININE 0.86  CALCIUM 9.3   GFR: Estimated Creatinine Clearance: 69.7 mL/min (by C-G formula based on SCr of 0.86 mg/dL).  Cardiac Enzymes:  Recent Labs Lab 01/22/16 1635  TROPONINI <0.03   Urine analysis:    Component Value Date/Time   COLORURINE YELLOW 07/09/2013 0058   APPEARANCEUR CLEAR 07/09/2013 0058   LABSPEC 1.011 07/09/2013 0058   PHURINE 5.5 07/09/2013 0058   GLUCOSEU NEGATIVE 07/09/2013 0058   HGBUR NEGATIVE 07/09/2013 0058   BILIRUBINUR NEGATIVE 07/09/2013 0058   KETONESUR NEGATIVE 07/09/2013 0058   PROTEINUR NEGATIVE 07/09/2013 0058   UROBILINOGEN 0.2 07/09/2013 0058   NITRITE NEGATIVE 07/09/2013 0058   LEUKOCYTESUR NEGATIVE 07/09/2013 0058    Radiological Exams on Admission: Dg Ribs Unilateral W/chest Left  Result Date: 01/22/2016 CLINICAL DATA:   Fall 3 days ago, left rib pain EXAM: LEFT RIBS AND CHEST - 3+ VIEW COMPARISON:  12/28/2014 FINDINGS: Three views left ribs submitted. Study is limited by diffuse osteopenia. Mild perihilar bronchitic changes. Streaky bilateral basilar atelectasis, scarring or infiltrate. Question nondisplaced fracture of the left seventh rib. Question nondisplaced fracture of the left 8 rib. No pneumothorax. IMPRESSION: Study is limited by diffuse osteopenia. Mild perihilar bronchitic changes. Streaky bilateral basilar atelectasis, scarring or infiltrate. Question nondisplaced fracture of the left seventh rib. Question nondisplaced fracture of the left 8 rib. No pneumothorax. Electronically Signed   By: Lahoma Crocker M.D.   On: 01/22/2016 11:57   Ct Chest W Contrast  Result Date: 01/22/2016 CLINICAL DATA:  Increasing shortness of breath. Left rib fractures post fall. History of COPD. EXAM: CT CHEST WITH CONTRAST TECHNIQUE: Multidetector CT imaging of the chest was performed during intravenous contrast administration. CONTRAST:  45mL ISOVUE-300 IOPAMIDOL (ISOVUE-300) INJECTION 61% COMPARISON:  Chest radiograph 01/22/2016, CT of the chest 01/08/202013 FINDINGS: Cardiovascular: The heart is normal in size. There is no pericardial effusion. There is no evidence of central pulmonary embolus or thoracic aortic dissection. The segmental and subsegmental branches of the lower lobes are poorly evaluated due to breathing motion artifact. Mediastinum/Nodes: Stable borderline superior mediastinal lymphadenopathy with left pretracheal lymph node 11 in short axis an ABI window node measuring 11 mm in short axis. Lungs/Pleura: Upper lobe predominant traction bronchiectasis, and mild emphysematous changes. Hyperventilatory changes of bilateral lung bases and right middle lobe. Upper Abdomen: No acute findings. Musculoskeletal: No chest wall mass or suspicious bone lesions identified. IMPRESSION: No evidence of central pulmonary embolus or thoracic  dissection. COPD. Electronically Signed   By: Fidela Salisbury M.D.   On: 01/22/2016 18:09   Echocardiogram 07/17/2015  ------------------------------------------------------------------- LV EF: 55% -   60%  ------------------------------------------------------------------- Indications:      CHF (I50.32).  -------------------------------------------------------------------  History:   PMH:   Dyspnea.  Congestive heart failure.  Chronic obstructive pulmonary disease.  Risk factors:  Pulmonary hypertension. Chronic respiratory failure. Schatzki&'s ring. Peripheral neuropathy. Osteoarthritis. Osteoporosis. Nephrolithiasis. Anxiety. Depression. Former tobacco use. Hypertension. Diabetes mellitus. Morbidly obese.  ------------------------------------------------------------------- Study Conclusions  - Left ventricle: The cavity size was normal. Wall thickness was   increased in a pattern of mild LVH. Systolic function was normal.   The estimated ejection fraction was in the range of 55% to 60%.   Wall motion was normal; there were no regional wall motion   abnormalities. Doppler parameters are consistent with abnormal   left ventricular relaxation (grade 1 diastolic dysfunction). - Mitral valve: There was mild regurgitation. - Left atrium: The atrium was mildly dilated.  Impressions:  - Normal LV systolic function; grade 1 diastolic dysfunction; mild   MR; mild LAE.    Assessment/Plan Principal Problem:   COPD exacerbation (HCC)   Chronic respiratory failure (HCC) Telemetry/observation. Continue supplemental oxygen. DuoNeb every 6 hours. Continue beta agonist as needed. Continue Solu-Medrol 40 mg IVP every 6 hours.  Active Problems:   Diabetes mellitus type 2 with neurological manifestations (HCC) Carbohydrate modified diet. Continue Lantus 66 units SQ daily. CBG monitoring with regular insulin sliding scale.    Depression/Anxiety Continue fluoxetine 60 mg by  mouth every morning. Continue Wellbutrin 300 mg by mouth every morning. Continue alprazolam 0.25 mg as needed.    Essential hypertension Continue furosemide 80 mg by mouth twice a day. Continue verapamil 360 mg by mouth daily. Monitor blood pressure closely.    PAF (paroxysmal atrial fibrillation) (HCC) CHA2DS2-VASc Score of at least 7. Continue Xarelto 20 mg by mouth daily. Continue verapamil 360 mg by mouth daily for rate control      Chronic diastolic CHF (congestive heart failure) (HCC) Continue furosemide 80 mg by mouth twice a day. KCl 30 mEq twice a day. Verapamil 360 mg by mouth daily    Biomechanical lesion of rib cage. Continue analgesics as needed. Repeat rib series if pain worsens to reevaluate for rib fractures.    Anemia Monitor hematocrit and hemoglobin.     GERD. Continue PPI.   DVT prophylaxis: On Xarelto Code Status: Full code. Family Communication: Her spouse Wynetta Emery was present in the room. Disposition Plan: Admit overnight for COPD exacerbation treatment. Consults called: Admission status: Telemetry/observation.   Reubin Milan MD [facility] Pager [phone].  If 7PM-7AM, please contact night-coverage [email or web address] Password [facility]  01/22/2016, 8:34 PM

## 2016-01-23 DIAGNOSIS — I480 Paroxysmal atrial fibrillation: Secondary | ICD-10-CM | POA: Diagnosis present

## 2016-01-23 DIAGNOSIS — E1149 Type 2 diabetes mellitus with other diabetic neurological complication: Secondary | ICD-10-CM | POA: Diagnosis not present

## 2016-01-23 DIAGNOSIS — M797 Fibromyalgia: Secondary | ICD-10-CM | POA: Diagnosis present

## 2016-01-23 DIAGNOSIS — R0789 Other chest pain: Secondary | ICD-10-CM | POA: Diagnosis not present

## 2016-01-23 DIAGNOSIS — I5032 Chronic diastolic (congestive) heart failure: Secondary | ICD-10-CM

## 2016-01-23 DIAGNOSIS — Z794 Long term (current) use of insulin: Secondary | ICD-10-CM | POA: Diagnosis not present

## 2016-01-23 DIAGNOSIS — M810 Age-related osteoporosis without current pathological fracture: Secondary | ICD-10-CM | POA: Diagnosis present

## 2016-01-23 DIAGNOSIS — Z79899 Other long term (current) drug therapy: Secondary | ICD-10-CM | POA: Diagnosis not present

## 2016-01-23 DIAGNOSIS — D509 Iron deficiency anemia, unspecified: Secondary | ICD-10-CM | POA: Diagnosis not present

## 2016-01-23 DIAGNOSIS — M999 Biomechanical lesion, unspecified: Secondary | ICD-10-CM | POA: Diagnosis present

## 2016-01-23 DIAGNOSIS — Z7901 Long term (current) use of anticoagulants: Secondary | ICD-10-CM | POA: Diagnosis not present

## 2016-01-23 DIAGNOSIS — E785 Hyperlipidemia, unspecified: Secondary | ICD-10-CM | POA: Diagnosis present

## 2016-01-23 DIAGNOSIS — J441 Chronic obstructive pulmonary disease with (acute) exacerbation: Secondary | ICD-10-CM | POA: Diagnosis not present

## 2016-01-23 DIAGNOSIS — F419 Anxiety disorder, unspecified: Secondary | ICD-10-CM | POA: Diagnosis present

## 2016-01-23 DIAGNOSIS — Z9981 Dependence on supplemental oxygen: Secondary | ICD-10-CM | POA: Diagnosis not present

## 2016-01-23 DIAGNOSIS — K219 Gastro-esophageal reflux disease without esophagitis: Secondary | ICD-10-CM | POA: Diagnosis present

## 2016-01-23 DIAGNOSIS — Z7951 Long term (current) use of inhaled steroids: Secondary | ICD-10-CM | POA: Diagnosis not present

## 2016-01-23 DIAGNOSIS — I110 Hypertensive heart disease with heart failure: Secondary | ICD-10-CM | POA: Diagnosis present

## 2016-01-23 DIAGNOSIS — G4730 Sleep apnea, unspecified: Secondary | ICD-10-CM | POA: Diagnosis present

## 2016-01-23 DIAGNOSIS — J9620 Acute and chronic respiratory failure, unspecified whether with hypoxia or hypercapnia: Secondary | ICD-10-CM | POA: Diagnosis present

## 2016-01-23 DIAGNOSIS — Z87891 Personal history of nicotine dependence: Secondary | ICD-10-CM | POA: Diagnosis not present

## 2016-01-23 DIAGNOSIS — F329 Major depressive disorder, single episode, unspecified: Secondary | ICD-10-CM | POA: Diagnosis present

## 2016-01-23 LAB — RETICULOCYTES
RBC.: 4.13 MIL/uL (ref 3.87–5.11)
Retic Count, Absolute: 82.6 10*3/uL (ref 19.0–186.0)
Retic Ct Pct: 2 % (ref 0.4–3.1)

## 2016-01-23 LAB — CBC WITH DIFFERENTIAL/PLATELET
BASOS PCT: 0 %
Basophils Absolute: 0 10*3/uL (ref 0.0–0.1)
EOS ABS: 0 10*3/uL (ref 0.0–0.7)
EOS PCT: 0 %
HCT: 34.3 % — ABNORMAL LOW (ref 36.0–46.0)
HEMOGLOBIN: 9.9 g/dL — AB (ref 12.0–15.0)
Lymphocytes Relative: 11 %
Lymphs Abs: 0.6 10*3/uL — ABNORMAL LOW (ref 0.7–4.0)
MCH: 24 pg — ABNORMAL LOW (ref 26.0–34.0)
MCHC: 28.9 g/dL — AB (ref 30.0–36.0)
MCV: 83.1 fL (ref 78.0–100.0)
MONOS PCT: 1 %
Monocytes Absolute: 0.1 10*3/uL (ref 0.1–1.0)
NEUTROS PCT: 88 %
Neutro Abs: 4.6 10*3/uL (ref 1.7–7.7)
PLATELETS: 260 10*3/uL (ref 150–400)
RBC: 4.13 MIL/uL (ref 3.87–5.11)
RDW: 16.7 % — ABNORMAL HIGH (ref 11.5–15.5)
WBC: 5.3 10*3/uL (ref 4.0–10.5)

## 2016-01-23 LAB — IRON AND TIBC
IRON: 14 ug/dL — AB (ref 28–170)
SATURATION RATIOS: 4 % — AB (ref 10.4–31.8)
TIBC: 371 ug/dL (ref 250–450)
UIBC: 357 ug/dL

## 2016-01-23 LAB — COMPREHENSIVE METABOLIC PANEL
ALBUMIN: 3.1 g/dL — AB (ref 3.5–5.0)
ALK PHOS: 59 U/L (ref 38–126)
ALT: 15 U/L (ref 14–54)
ANION GAP: 7 (ref 5–15)
AST: 19 U/L (ref 15–41)
BUN: 15 mg/dL (ref 6–20)
CALCIUM: 9.2 mg/dL (ref 8.9–10.3)
CHLORIDE: 101 mmol/L (ref 101–111)
CO2: 29 mmol/L (ref 22–32)
Creatinine, Ser: 0.81 mg/dL (ref 0.44–1.00)
GFR calc non Af Amer: >60 mL/min (ref >60)
GLUCOSE: 210 mg/dL — AB (ref 65–99)
Potassium: 4.1 mmol/L (ref 3.5–5.1)
SODIUM: 137 mmol/L (ref 135–145)
Total Bilirubin: 0.4 mg/dL (ref 0.3–1.2)
Total Protein: 6.3 g/dL — ABNORMAL LOW (ref 6.5–8.1)

## 2016-01-23 LAB — FOLATE: FOLATE: 49.5 ng/mL (ref >5.9)

## 2016-01-23 LAB — GLUCOSE, CAPILLARY
GLUCOSE-CAPILLARY: 207 mg/dL — AB (ref 65–99)
Glucose-Capillary: 198 mg/dL — ABNORMAL HIGH (ref 65–99)
Glucose-Capillary: 144 mg/dL — ABNORMAL HIGH (ref 65–99)
Glucose-Capillary: 160 mg/dL — ABNORMAL HIGH (ref 65–99)

## 2016-01-23 LAB — FERRITIN: FERRITIN: 6 ng/mL — AB (ref 11–307)

## 2016-01-23 LAB — VITAMIN B12: Vitamin B-12: 1073 pg/mL — ABNORMAL HIGH (ref 180–914)

## 2016-01-23 MED ORDER — AZITHROMYCIN 500 MG PO TABS
250.0000 mg | ORAL_TABLET | Freq: Every day | ORAL | Status: DC
  Administered 2016-01-24: 250 mg via ORAL
  Filled 2016-01-23: qty 1

## 2016-01-23 MED ORDER — CETYLPYRIDINIUM CHLORIDE 0.05 % MT LIQD
7.0000 mL | Freq: Two times a day (BID) | OROMUCOSAL | Status: DC
  Administered 2016-01-23 – 2016-01-24 (×3): 7 mL via OROMUCOSAL

## 2016-01-23 MED ORDER — SODIUM CHLORIDE 0.9 % IV SOLN
510.0000 mg | Freq: Once | INTRAVENOUS | Status: AC
  Administered 2016-01-23: 510 mg via INTRAVENOUS
  Filled 2016-01-23 (×2): qty 17

## 2016-01-23 MED ORDER — AZITHROMYCIN 500 MG PO TABS
500.0000 mg | ORAL_TABLET | Freq: Every day | ORAL | Status: AC
  Administered 2016-01-23: 500 mg via ORAL
  Filled 2016-01-23: qty 1

## 2016-01-23 MED ORDER — POLYSACCHARIDE IRON COMPLEX 150 MG PO CAPS
150.0000 mg | ORAL_CAPSULE | Freq: Two times a day (BID) | ORAL | Status: DC
  Administered 2016-01-23 – 2016-01-24 (×2): 150 mg via ORAL
  Filled 2016-01-23 (×2): qty 1

## 2016-01-23 MED ORDER — PREDNISONE 20 MG PO TABS
40.0000 mg | ORAL_TABLET | Freq: Every day | ORAL | Status: DC
  Administered 2016-01-23 – 2016-01-24 (×2): 40 mg via ORAL
  Filled 2016-01-23 (×2): qty 2

## 2016-01-23 NOTE — Consult Note (Signed)
   [facility] Inpatient Consult   01/23/2016  Destiny Shelton [facility] 11-01-1945 [id number]  Referral received to assess for care management services. Patient is a beneficiary in the [facility] [place] registry.     Met with the patient, husband, and daughter at the bedside regarding the benefits of [facility] Care Management services. Patient expressed she had home health in the past and she did not think she had any needs for charges to her Medicare. Patient has a HX of COPD and a new for High flow oxygen and has it serviced through [facility].  She states she uses 4L at rest and "if I get up and stirring around I pump it up to 6."  Patient denies any medications issues or transportation issues.  Explained that [facility] is a covered benefit of insurance and no co-pays or any additional cost for services. Review information for [facility] Care Management and a brochure was provided with contact information and accepted for review.  Explained that [facility] does not interfere with or replace any services arranged by the inpatient care management staff.  Patient declined services with [facility].  Encouraged the patient to call at anytime to start services as needed.  Patient verbalized understanding.  For questions, please contact:  Natividad Brood, RN BSN [facility] Liaison  [phone] business mobile phone Toll free office [phone]

## 2016-01-23 NOTE — Care Management Note (Signed)
Case Management Note  Patient Details  Name: Destiny Shelton MRN: [id number] Date of Birth: 29-Nov-1945  Subjective/Objective:                 Pt admitted with COPD exacerbation. Lives at home with husband. Patient stills drives. On 4L O2 at home supplied through [facility], has nebulizer, No CPAP needed at this time. Has cane and rolator.  Declined HH. Patient has [facility] banner, referral made to follow for COPD. PCP Dr Brigitte Pulse, pharmacy [facility] on [address].    Action/Plan:  Will DC to home self care when medically stable.   Expected Discharge Date:                  Expected Discharge Plan:  Home/Self Care  In-House Referral:  NA  Discharge planning Services  CM Consult  Post Acute Care Choice:  NA Choice offered to:  NA  DME Arranged:  N/A DME Agency:  NA  HH Arranged:  NA HH Agency:  NA  Status of Service:  Completed, signed off  If discussed at [place] of Stay Meetings, dates discussed:    Additional Comments:  Carles Collet, RN 01/23/2016, 11:16 AM

## 2016-01-23 NOTE — Progress Notes (Signed)
PROGRESS NOTE  Destiny Shelton  [id number] DOB: [birth year] DOA: 01/22/2016 PCP: Marton Redwood, MD  Brief Narrative:  Destiny Shelton is a 70 y.o. female with medical history significant of COPD on home oxygen usually at 4 LPM, type 2 diabetes, anemia, anxiety, paroxysmal atrial fibrillation, carotid stenosis, depression, diverticulosis, fibromyalgia, GERD, gastritis, hyperlipidemia, hypertension, osteoarthritis, osteoporosis, sleep apnea not on CPAP who came to the emergency department due to left-sided chest wall pain after sustaining direct trauma on her left-sided chest wall during a mechanical fall at home 2 days prior to admission. She states that she has been having worsening dyspnea secondary to shallow respirations. Her O2 sat was in the 80s at home on 4 LPM and her oxygen requirement now is 6 LPM.   CXR demonstrated atelectasis.  CT angio was negative for PE.  Assessment & Plan:   Principal Problem:   COPD exacerbation ([place]) Active Problems:   Diabetes mellitus type 2 with neurological manifestations ([place])   Depression   Essential hypertension   PAF (paroxysmal atrial fibrillation) (HCC)   Chronic respiratory failure (HCC)   Chronic diastolic CHF (congestive heart failure) (HCC)   Biomechanical lesion of rib cage   Anemia    Acute on chronic respiratory failure secondary to splinting and acute COPD exacerbation (HCC) Continue supplemental oxygen. DuoNeb every 6 hours. Continue beta agonist as needed. D/c Solu-Medrol and start prednisone Start azithromycin Start incentive spirometer Pain control Ambulatory pulse ox and PT eval:  Quick to have respiratory distress and O2 sat dropped to 60s.  Slow to recover  Active Problems:   Diabetes mellitus type 2 with neurological manifestations ([place]), CBG mildly elevated on steroids Carbohydrate modified diet. Continue Lantus 66 units SQ daily. CBG monitoring with regular insulin sliding scale.    Depression/Anxiety Continue  fluoxetine 60 mg by mouth every morning. Continue Wellbutrin 300 mg by mouth every morning. Continue alprazolam 0.25 mg as needed.    Essential hypertension Continue furosemide 80 mg by mouth twice a day. Continue verapamil 360 mg by mouth daily. Monitor blood pressure closely.    PAF (paroxysmal atrial fibrillation) (HCC) CHA2DS2-VASc Score of at least 7. Continue Xarelto 20 mg by mouth daily. Continue verapamil 360 mg by mouth daily for rate control      Chronic diastolic CHF (congestive heart failure) (HCC) Continue furosemide 80 mg by mouth twice a day. KCl 30 mEq twice a day. Verapamil 360 mg by mouth daily    Biomechanical lesion of rib cage. Continue analgesics as needed. Repeat rib series if pain worsens to reevaluate for rib fractures.  Iron deficiency Anemia - feraheme x 1 - increase to BID iron     GERD. Continue PPI.   DVT prophylaxis: On Xarelto Code Status: Full code. Family Communication:  patient alone Disposition Plan:  Await improvement in dyspnea.  Needs to be able to perform basic ADLs like going to the bathroom without risk of hypoxic syncope.  Consultants:   none  Procedures:  none  Antimicrobials:   azithro 8/23 >    Subjective:  Ongoing pain of the left chest.  Less SOB than before, however.    Objective: Vitals:   01/23/16 0109 01/23/16 0534 01/23/16 0620 01/23/16 1440  BP:  (!) 93/41 (!) 106/48 (!) 92/42  Pulse:  98 95 92  Resp:  18    Temp:  97.8 F (36.6 C)  98.2 F (36.8 C)  TempSrc:  Oral  Oral  SpO2: 93% 91%  92%  Weight:  Height:        Intake/Output Summary (Last 24 hours) at 01/23/16 1826 Last data filed at 01/23/16 1300  Gross per 24 hour  Intake              683 ml  Output             1325 ml  Net             -642 ml   Filed Weights   01/22/16 1108 01/22/16 2054  Weight: 98.4 kg (217 lb) 98 kg (216 lb 0.8 oz)    Examination:  General exam:  Adult female.  No acute distress.  HEENT:  NCAT,  MMM Respiratory system:  Diminished bilateral BS, no focal rales or rhonchi Cardiovascular system:  Tachycardic to 120s, normal S1/S2. No murmurs, rubs, gallops or clicks.  Warm extremities Gastrointestinal system: Normal active bowel sounds, soft, nondistended, nontender. MSK:  Normal tone and bulk, no lower extremity edema Neuro:  Grossly intact    Data Reviewed: I have personally reviewed following labs and imaging studies  CBC:  Recent Labs Lab 01/22/16 1635 01/23/16 0520  WBC 10.1 5.3  NEUTROABS 6.2 4.6  HGB 10.6* 9.9*  HCT 35.9* 34.3*  MCV 83.5 83.1  PLT 274 [id number]   Basic Metabolic Panel:  Recent Labs Lab 01/22/16 1635 01/23/16 0520  NA 135 137  K 3.5 4.1  CL 96* 101  CO2 29 29  GLUCOSE 102* 210*  BUN 13 15  CREATININE 0.86 0.81  CALCIUM 9.3 9.2  MG 2.3  --   PHOS 4.3  --    GFR: Estimated Creatinine Clearance: 73.8 mL/min (by C-G formula based on SCr of 0.81 mg/dL). Liver Function Tests:  Recent Labs Lab 01/23/16 0520  AST 19  ALT 15  ALKPHOS 59  BILITOT 0.4  PROT 6.3*  ALBUMIN 3.1*   No results for input(s): LIPASE, AMYLASE in the last 168 hours. No results for input(s): AMMONIA in the last 168 hours. Coagulation Profile: No results for input(s): INR, PROTIME in the last 168 hours. Cardiac Enzymes:  Recent Labs Lab 01/22/16 1635  TROPONINI <0.03   BNP (last 3 results) No results for input(s): PROBNP in the last 8760 hours. HbA1C: No results for input(s): HGBA1C in the last 72 hours. CBG:  Recent Labs Lab 01/22/16 2054 01/23/16 0800 01/23/16 1135 01/23/16 1607  GLUCAP 121* 198* 207* 144*   Lipid Profile: No results for input(s): CHOL, HDL, LDLCALC, TRIG, CHOLHDL, LDLDIRECT in the last 72 hours. Thyroid Function Tests: No results for input(s): TSH, T4TOTAL, FREET4, T3FREE, THYROIDAB in the last 72 hours. Anemia Panel:  Recent Labs  01/23/16 0520 01/23/16 0521  VITAMINB12 1,073*  --   FOLATE  --  49.5  FERRITIN 6*  --     TIBC 371  --   IRON 14*  --   RETICCTPCT 2.0  --    Urine analysis:    Component Value Date/Time   COLORURINE YELLOW 07/09/2013 0058   APPEARANCEUR CLEAR 07/09/2013 0058   LABSPEC 1.011 07/09/2013 0058   PHURINE 5.5 07/09/2013 0058   GLUCOSEU NEGATIVE 07/09/2013 0058   HGBUR NEGATIVE 07/09/2013 0058   BILIRUBINUR NEGATIVE 07/09/2013 0058   KETONESUR NEGATIVE 07/09/2013 0058   PROTEINUR NEGATIVE 07/09/2013 0058   UROBILINOGEN 0.2 07/09/2013 0058   NITRITE NEGATIVE 07/09/2013 0058   LEUKOCYTESUR NEGATIVE 07/09/2013 0058   Sepsis Labs: @LABRCNTIP (procalcitonin:4,lacticidven:4)  )No results found for this or any previous visit (from the past 240 hour(s)).  Radiology Studies: Dg Ribs Unilateral W/chest Left  Result Date: 01/22/2016 CLINICAL DATA:  Fall 3 days ago, left rib pain EXAM: LEFT RIBS AND CHEST - 3+ VIEW COMPARISON:  12/28/2014 FINDINGS: Three views left ribs submitted. Study is limited by diffuse osteopenia. Mild perihilar bronchitic changes. Streaky bilateral basilar atelectasis, scarring or infiltrate. Question nondisplaced fracture of the left seventh rib. Question nondisplaced fracture of the left 8 rib. No pneumothorax. IMPRESSION: Study is limited by diffuse osteopenia. Mild perihilar bronchitic changes. Streaky bilateral basilar atelectasis, scarring or infiltrate. Question nondisplaced fracture of the left seventh rib. Question nondisplaced fracture of the left 8 rib. No pneumothorax. Electronically Signed   By: Lahoma Crocker M.D.   On: 01/22/2016 11:57   Ct Chest W Contrast  Result Date: 01/22/2016 CLINICAL DATA:  Increasing shortness of breath. Left rib fractures post fall. History of COPD. EXAM: CT CHEST WITH CONTRAST TECHNIQUE: Multidetector CT imaging of the chest was performed during intravenous contrast administration. CONTRAST:  55mL ISOVUE-300 IOPAMIDOL (ISOVUE-300) INJECTION 61% COMPARISON:  Chest radiograph 01/22/2016, CT of the chest 2020-11-2211 FINDINGS:  Cardiovascular: The heart is normal in size. There is no pericardial effusion. There is no evidence of central pulmonary embolus or thoracic aortic dissection. The segmental and subsegmental branches of the lower lobes are poorly evaluated due to breathing motion artifact. Mediastinum/Nodes: Stable borderline superior mediastinal lymphadenopathy with left pretracheal lymph node 11 in Nataki Mccrumb axis an ABI window node measuring 11 mm in Talma Aguillard axis. Lungs/Pleura: Upper lobe predominant traction bronchiectasis, and mild emphysematous changes. Hyperventilatory changes of bilateral lung bases and right middle lobe. Upper Abdomen: No acute findings. Musculoskeletal: No chest wall mass or suspicious bone lesions identified. IMPRESSION: No evidence of central pulmonary embolus or thoracic dissection. COPD. Electronically Signed   By: Fidela Salisbury M.D.   On: 01/22/2016 18:09     Scheduled Meds: . antiseptic oral rinse  7 mL Mouth Rinse BID  . atorvastatin  40 mg Oral QHS  . [START ON 01/24/2016] azithromycin  250 mg Oral Daily  . buPROPion  300 mg Oral BH-q7a  . docusate sodium  100 mg Oral BH-q7a  . DULoxetine  60 mg Oral q morning - 10a  . furosemide  80 mg Oral BID  . gabapentin  600 mg Oral TID  . insulin aspart  0-20 Units Subcutaneous TID WC  . insulin glargine  66 Units Subcutaneous QAC breakfast  . ipratropium-albuterol  3 mL Nebulization Q6H  . irbesartan  150 mg Oral Daily  . iron polysaccharides  150 mg Oral Q1200  . multivitamin with minerals  1 tablet Oral q morning - 10a  . pantoprazole  40 mg Oral Daily  . polyethylene glycol  17 g Oral Daily  . potassium chloride  30 mEq Oral BID  . predniSONE  40 mg Oral Q breakfast  . rivaroxaban  20 mg Oral Q supper  . roflumilast  500 mcg Oral Daily  . sodium chloride flush  3 mL Intravenous Q12H  . tiotropium  18 mcg Inhalation BH-q7a  . verapamil  360 mg Oral Daily   Continuous Infusions:    LOS: 0 days    Time spent: 30  min    Janece Canterbury, MD [facility] Pager [phone]  If 7PM-7AM, please contact night-coverage [email or web address] Password [facility] 01/23/2016, 6:26 PM

## 2016-01-23 NOTE — Care Management Obs Status (Signed)
[place] NOTIFICATION   Patient Details  Name: Destiny Shelton MRN: [id number] Date of Birth: 1946-05-18   Medicare Observation Status Notification Given:  Yes    Carles Collet, RN 01/23/2016, 11:15 AM

## 2016-01-23 NOTE — Progress Notes (Signed)
NURSING PROGRESS NOTE  Destiny Dees MedearisMRN: [id number] Admission Data: 01/23/2016 at 8:45PM Attending Provider: Reubin Milan, MD PCP: Marton Redwood, MD Code status: Full  Allergies:  Allergies  Allergen Reactions  . Aspirin Shortness Of Breath  . Nsaids Shortness Of Breath   Past Medical History:  Past Medical History:  Diagnosis Date  . Adenomatous colon polyp   . ALLERGIC RHINITIS   . Anemia    iron deficient  . Anxiety   . Atrial tachycardia (HCC)    Mostly Sinus Tachycardia  . Back pain, chronic   . Carotid stenosis   . Community acquired pneumonia - Recent Admission (07/2013) 07/08/2013  . COPD (chronic obstructive pulmonary disease) ([place])    Requiring Home O2 at 4L  . Depression   . Diabetes mellitus type 2 with neurological manifestations ([place])    On Insulin  . Diverticulosis of colon   . External hemorrhoids   . Fibromyalgia    "pain in arms and shoulders."  . Gastritis   . GERD (gastroesophageal reflux disease)   . Headache(784.0)    tension  . Hyperlipemia   . Hypertension   . Inappropriate sinus tachycardia ([place])   . Morbidly obese ([place])   . Nephrolithiasis   . Neuromuscular disorder ([place])    diabetic neuropathy  . Osteoarthritis   . Osteoporosis   . PAF (paroxysmal atrial fibrillation) ([place])   . 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  . Sleep apnea    no cpap machine  02 at 4l/min all the time  . Urosepsis 05/26/2012   Past Surgical History:  Past Surgical History:  Procedure Laterality Date  . ABDOMINAL HYSTERECTOMY  1978  . APPENDECTOMY  1963  . CARDIAC CATHETERIZATION N/A 07/04/2015   Procedure: Right Heart Cath;  Surgeon: Larey Dresser, MD;  Location: [facility];  Service: Cardiovascular;  Laterality: N/A;  . CHOLECYSTECTOMY  1963  . COLONOSCOPY N/A 12/21/2012   Procedure: COLONOSCOPY;  Surgeon: Gatha Mayer, MD;  Location: [facility];  Service: Endoscopy;  Laterality: N/A;  . COLONOSCOPY N/A 11/17/2013   Procedure: COLONOSCOPY;   Surgeon: Gatha Mayer, MD;  Location: [facility];  Service: Endoscopy;  Laterality: N/A;  . COLONOSCOPY W/ BIOPSIES AND POLYPECTOMY  07/22/2007, 04/28/2007   2009: diverticulosis, 2008: diverticulosis, adenomatous polyps  . COLONOSCOPY WITH PROPOFOL N/A 10/01/2012   Procedure: COLONOSCOPY WITH PROPOFOL;  Surgeon: Gatha Mayer, MD;  Location: [facility];  Service: Endoscopy;  Laterality: N/A;  . COLONOSCOPY WITH PROPOFOL N/A 10/10/2014   Procedure: COLONOSCOPY WITH PROPOFOL;  Surgeon: Gatha Mayer, MD;  Location: [facility];  Service: Endoscopy;  Laterality: N/A;  . ESOPHAGOGASTRODUODENOSCOPY (EGD) WITH PROPOFOL N/A 10/01/2012   Procedure: ESOPHAGOGASTRODUODENOSCOPY (EGD) WITH PROPOFOL;  Surgeon: Gatha Mayer, MD;  Location: [facility];  Service: Endoscopy;  Laterality: N/A;  . HOT HEMOSTASIS N/A 12/21/2012   Procedure: HOT HEMOSTASIS (ARGON PLASMA COAGULATION/BICAP);  Surgeon: Gatha Mayer, MD;  Location: [facility];  Service: Endoscopy;  Laterality: N/A;  . HOT HEMOSTASIS N/A 10/10/2014   Procedure: HOT HEMOSTASIS (ARGON PLASMA COAGULATION/BICAP);  Surgeon: Gatha Mayer, MD;  Location: [facility];  Service: Endoscopy;  Laterality: N/A;  . LEFT AND RIGHT HEART CATHETERIZATION WITH CORONARY ANGIOGRAM  12/19/2011   Procedure: LEFT AND RIGHT HEART CATHETERIZATION WITH CORONARY ANGIOGRAM;  Surgeon: Leonie Man, MD;  Location: [facility] CATH LAB;  Service: Cardiovascular;;  . RIGHT AND LEFT CARDIAC CATHETERIZATION  July 2013   RAP: 22 mmHg, and RVP: 46/16 mmHg, RV EDP 18;  L. BP 110/11 mmHg, LVEDP 20 mmHg;  PAP 45/29 mmHg (mean 37 mmHg); PCWP 29/22 mmHg - 26 mmHg; no significant coronary disease  . TEE WITHOUT CARDIOVERSION  03/24/2012   Procedure: TRANSESOPHAGEAL ECHOCARDIOGRAM (TEE);  Surgeon: Pixie Casino, MD;  Location[facility];  Service: Cardiovascular;  Laterality: N/A;  TEE with [place]  . TOTAL KNEE ARTHROPLASTY  1997   right  . UPPER GASTROINTESTINAL ENDOSCOPY  04/09/2006    w/Dilation, gastritis   Destiny Shelton is a 70 y.o. female patient, arrived to floor in room 5W06 via stretcher, transferred from ED. Patient alert and oriented X 4. No acute distress noted. Complains of pain 5/10 left rib cage pain; patient denies wanting any pain medication at this time.   Vital signs: Oral temperature 97.7 F (36.5 C), Blood pressure 114/53, Pulse 96, RR 24, SpO2 91 % on 6 liters of oxygen. Height 5'5", weight 216 lbs (98 kg).   Cardiac monitoring: Telemetry box 5W #20 in place. Second verified by Blanchie Serve, Nurse Tech  IV access: Right forearm IV-saline locked; condition patent and no redness.  Skin: intact, no pressure ulcer noted in sacral area. Generalized bruising noted on arms and legs bilaterally along with abrasions on both arms. Callus noted on Right second toe.   Patient's ID armband verified with patient/ family, and in place. Information packet given to patient/ family. Fall risk assessed, SR up X2, patient/ family able to verbalize understanding of risks associated with falls and to call nurse or staff to assist before getting out of bed. Patient/ family oriented to room and equipment. Call bell within reach.

## 2016-01-23 NOTE — Evaluation (Signed)
Physical Therapy Evaluation Patient Details Name: Destiny Shelton MRN: [id number] DOB: November 18, 1945 Today's Date: 01/23/2016   History of Present Illness  70 y.o. female with medical history significant of COPD on home oxygen usually at 4 LPM, type 2 diabetes, anemia, anxiety, paroxysmal atrial fibrillation, carotid stenosis, depression, diverticulosis, fibromyalgia, GERD, gastritis, hyperlipidemia, hypertension, osteoarthritis, osteoporosis, sleep apnea not on CPAP who came to the emergency department due to left-sided chest wall pain.  Clinical Impression  Pt admitted with above diagnosis. Pt currently with functional limitations due to the deficits listed below (see PT Problem List). On eval, pt required min guard assist for transfers and gait with RW. Mobility significantly limited by cardiopulmonary deficits. Pt with O2 sat of 67% following short mobility distance in room. Sats increased to 88% with pursed-lip breathing and return to elevated position in bed. Pt will benefit from skilled PT to increase their independence and safety with mobility to allow discharge to the venue listed below.       Follow Up Recommendations Home health PT;Supervision for mobility/OOB    Equipment Recommendations  None recommended by PT    Recommendations for Other Services       Precautions / Restrictions Precautions Precautions: Fall Precaution Comments: 2 recent falls at home Restrictions Weight Bearing Restrictions: No      Mobility  Bed Mobility Overal bed mobility: Needs Assistance Bed Mobility: Supine to Sit;Sit to Supine     Supine to sit: Supervision;HOB elevated Sit to supine: Supervision   General bed mobility comments: +rail, Pt reports sleeping in recliner at home.  Transfers Overall transfer level: Needs assistance Equipment used: Rolling walker (2 wheeled) Transfers: Sit to/from [facility] Sit to Stand: Min guard Stand pivot transfers: Min guard        General transfer comment: verbal cues for hand placement  Ambulation/Gait Ambulation/Gait assistance: Min guard Ambulation Distance (Feet): 15 Feet (x 2) Assistive device: Rolling walker (2 wheeled) Gait Pattern/deviations: Step-through pattern;Decreased stride length Gait velocity: decreased   General Gait Details: Gait distance limited by desaturation. After ambulating to bathroom, O2 sats at 77% recovering to 85% with pursed-lip breathing. After completing toilet hygeine and return ambulation to EOB, O2 sats at 67%. With pursed-lip breathing, sats increased to 81% EOB. Upon return to bed with HOB elevated, sats at 88%.  Stairs            Wheelchair Mobility    Modified Rankin (Stroke Patients Only)       Balance Overall balance assessment: Needs assistance;History of Falls Sitting-balance support: Feet supported;No upper extremity supported Sitting balance-Leahy Scale: Good     Standing balance support: Bilateral upper extremity supported;During functional activity Standing balance-Leahy Scale: Fair                               Pertinent Vitals/Pain Pain Assessment: No/denies pain    Home Living Family/patient expects to be discharged to:: Private residence Living Arrangements: Spouse/significant other Available Help at Discharge: Family;Available 24 hours/day Type of Home: House Home Access: Level entry     Home Layout: One level Home Equipment: Walker - 4 wheels;Cane - single point;Shower seat;Wheelchair - manual;Other (comment) Additional Comments: home O2    Prior Function Level of Independence: Independent with assistive device(s)         Comments: Drives. Ambulates in community with rollator.     Hand Dominance   Dominant Hand: Right    Extremity/Trunk Assessment   Upper  Extremity Assessment: Overall WFL for tasks assessed           Lower Extremity Assessment: Generalized weakness      Cervical / Trunk Assessment:  Kyphotic  Communication   Communication: No difficulties  Cognition Arousal/Alertness: Awake/alert Behavior During Therapy: WFL for tasks assessed/performed Overall Cognitive Status: Within Functional Limits for tasks assessed                      General Comments      Exercises        Assessment/Plan    PT Assessment Patient needs continued PT services  PT Diagnosis Difficulty walking   PT Problem List Decreased strength;Decreased activity tolerance;Decreased balance;Cardiopulmonary status limiting activity;Decreased mobility  PT Treatment Interventions Gait training;Functional mobility training;Therapeutic activities;Therapeutic exercise;Balance training;Patient/family education   PT Goals (Current goals can be found in the Care Plan section) Acute Rehab PT Goals Patient Stated Goal: home PT Goal Formulation: With patient Time For Goal Achievement: 02/06/16 Potential to Achieve Goals: Good    Frequency Min 3X/week   Barriers to discharge        Co-evaluation               End of Session Equipment Utilized During Treatment: Gait belt Activity Tolerance: Treatment limited secondary to medical complications (Comment) (desats) Patient left: in bed;with family/visitor present;with call bell/phone within reach;with bed alarm set Nurse Communication: Mobility status    Functional Assessment Tool Used: clinical judgement Functional Limitation: Mobility: Walking and moving around Mobility: Walking and Moving Around Current Status [id number]): At least 20 percent but less than 40 percent impaired, limited or restricted Mobility: Walking and Moving Around Goal Status [phone]): At least 1 percent but less than 20 percent impaired, limited or restricted    Time: 0931-1002 PT Time Calculation (min) (ACUTE ONLY): 31 min   Charges:   PT Evaluation $PT Eval Moderate Complexity: 1 Procedure PT Treatments $Therapeutic Activity: 8-22 mins   PT G Codes:   PT G-Codes  **NOT FOR INPATIENT CLASS** Functional Assessment Tool Used: clinical judgement Functional Limitation: Mobility: Walking and moving around Mobility: Walking and Moving Around Current Status [id number]): At least 20 percent but less than 40 percent impaired, limited or restricted Mobility: Walking and Moving Around Goal Status [phone]): At least 1 percent but less than 20 percent impaired, limited or restricted    Lorriane Shire 01/23/2016, 10:12 AM

## 2016-01-23 NOTE — Progress Notes (Signed)
PT worked with patient prior to me checking her oxygen saturations at rest. May refer to PT note for O2 sats. On 4L of oxygen at rest 85% noted. 6L of oxygen at rest 92%.

## 2016-01-24 LAB — GLUCOSE, CAPILLARY
GLUCOSE-CAPILLARY: 94 mg/dL (ref 65–99)
GLUCOSE-CAPILLARY: 122 mg/dL — AB (ref 65–99)
Glucose-Capillary: 212 mg/dL — ABNORMAL HIGH (ref 65–99)

## 2016-01-24 MED ORDER — INSULIN GLARGINE 100 UNIT/ML ~~LOC~~ SOLN: 66.0000 [IU] | Freq: Every day | SUBCUTANEOUS | Status: AC

## 2016-01-24 MED ORDER — POLYSACCHARIDE IRON COMPLEX 150 MG PO CAPS: 150.0000 mg | ORAL_CAPSULE | Freq: Two times a day (BID) | ORAL | 0 refills | Status: AC

## 2016-01-24 MED ORDER — LIDOCAINE 5 % EX PTCH
1.0000 | MEDICATED_PATCH | CUTANEOUS | Status: DC
  Administered 2016-01-24: 1 via TRANSDERMAL
  Filled 2016-01-24: qty 1

## 2016-01-24 MED ORDER — ALBUTEROL SULFATE (2.5 MG/3ML) 0.083% IN NEBU: 2.5000 mg | INHALATION_SOLUTION | RESPIRATORY_TRACT | Status: DC | PRN

## 2016-01-24 MED ORDER — PREDNISONE 20 MG PO TABS: ORAL_TABLET | ORAL | 0 refills | Status: DC

## 2016-01-24 MED ORDER — AZITHROMYCIN 250 MG PO TABS: 250.0000 mg | ORAL_TABLET | Freq: Every day | ORAL | 0 refills | Status: DC

## 2016-01-24 MED ORDER — ALBUTEROL SULFATE (2.5 MG/3ML) 0.083% IN NEBU: 2.5000 mg | INHALATION_SOLUTION | Freq: Two times a day (BID) | RESPIRATORY_TRACT | Status: DC

## 2016-01-24 NOTE — Discharge Summary (Addendum)
Physician Discharge Summary  Destiny Shelton [id number] DOB: 23-Feb-1946 DOA: 01/22/2016  PCP: Marton Redwood, MD  Admit date: 01/22/2016 Discharge date: 01/24/2016  Admitted From: Home  Disposition:  Home  Recommendations for Outpatient Follow-up:  1. Follow up with Pulmonology ([facility] or [facility]) in 2 weeks  Home Health:  Yes.  RN and PT  Equipment/Devices:  Oxygen and nebulizer already at home  Discharge Condition:  Stable, improved CODE STATUS:  full  Diet recommendation:  diabetic   Brief/Interim Summary:  Destiny Shelton a 70 y.o.femalewith medical history significant of COPD on home oxygen usually at 4 LPM, type 2 diabetes, anemia, anxiety, paroxysmal atrial fibrillation, carotid stenosis, depression, diverticulosis, fibromyalgia, GERD, gastritis, hyperlipidemia, hypertension, osteoarthritis, osteoporosis, sleep apnea not on CPAP who came to the emergency department due to left-sided chest wall pain after sustaining direct trauma on her left-sided chest wall during a mechanical fall at home 2 days prior to admission. She states that she has been having worsening dyspnea secondary to shallow respirations. Her O2 sat was in the 80s at home on 4 LPM and her oxygen requirement now is 6 LPM.   CXR demonstrated atelectasis.  CT angio was negative for PE.  Discharge Diagnoses:  Principal Problem:   COPD exacerbation (Destiny Shelton) Active Problems:   Diabetes mellitus type 2 with neurological manifestations (Destiny Shelton)   Depression   Essential hypertension   PAF (paroxysmal atrial fibrillation) (HCC)   Chronic respiratory failure (HCC)   Chronic diastolic CHF (congestive heart failure) (HCC)   Biomechanical lesion of rib cage   Anemia  Acute on chronic respiratory failure secondary to splinting and acute COPD exacerbation (Destiny Shelton).  She was started on pain medication, lidocaine patch and encouraged to use an incentive spirometer to help increase her breath depth.  She was also started on  steroids, nebulizer treatments and azithromycin for possible COPD exacerbation.  She required 6L [place] at rest and had desaturations to the 60s with minimal exertion at the time of admission, but by the time of discharge, she was able to use her home 4L [place] at rest and 6L with exertion.  She felt that she was able to perform basic ADLs at home at the time of discharge and asked to be sent home.  She had O2 sats that decreased to the low/mid-80s with respiratory distress that improved after about a minute.  PT recommended home health services.  She was given prescriptions for prednisone taper, azithromycin.  She already takes hydrocodone for pain and was not given a new prescription.  She should continue her Daliresp, symbicort, spiriva, and nebulizer treatments. She should follow up with Pulmonology in 2 weeks or sooner if possible.    Active Problems: Diabetes mellitus type 2 with neurological manifestations ([place]), CBG mildly elevated on steroids.   Carbohydrate modified diet. Continued Lantus 66 units SQ daily. CBG monitoring with regular insulin sliding scale.  Depression/Anxiety Continued fluoxetine 60 mg by mouth every morning. Continued Wellbutrin 300 mg by mouth every morning. Continuedalprazolam 0.25 mg as needed.  Essential hypertension Continue furosemide 80 mg by mouth twice a day. Continue verapamil 360 mg by mouth daily.  PAF (paroxysmal atrial fibrillation) (HCC) CHA2DS2-VASc Scoreof at least 7. Continue Xarelto 20 mg by mouth daily. Continue verapamil 360 mg by mouth daily for rate control  Chronic diastolic CHF (congestive heart failure) (HCC) Continued furosemide 80 mg by mouth twice a day. KCl 30 mEq twice a day. Verapamil 360 mg by mouth daily  Biomechanical lesion of rib cage. Continue  analgesics as needed. Repeat rib series if pain worsens to reevaluate for rib fractures.  Iron deficiencyAnemia - feraheme x 1 - increased to BID iron    GERD. Continued PPI.   Discharge Instructions  Discharge Instructions    (HEART FAILURE PATIENTS) Call MD:  Anytime you have any of the following symptoms: 1) 3 pound weight gain in 24 hours or 5 pounds in 1 week 2) shortness of breath, with or without a dry hacking cough 3) swelling in the hands, feet or stomach 4) if you have to sleep on extra pillows at night in order to breathe.    Complete by:  As directed   Call MD for:  difficulty breathing, headache or visual disturbances    Complete by:  As directed   Call MD for:  extreme fatigue    Complete by:  As directed   Call MD for:  hives    Complete by:  As directed   Call MD for:  persistant dizziness or light-headedness    Complete by:  As directed   Call MD for:  persistant nausea and vomiting    Complete by:  As directed   Call MD for:  severe uncontrolled pain    Complete by:  As directed   Call MD for:  temperature >100.4    Complete by:  As directed   Diet Carb Modified    Complete by:  As directed   Driving Restrictions    Complete by:  As directed   No driving until cleared by your doctor.   Increase activity slowly    Complete by:  As directed       Medication List    STOP taking these medications   AMBULATORY NON FORMULARY MEDICATION     TAKE these medications   acetaminophen 500 MG tablet Commonly known as:  TYLENOL 500 mg every 6 (six) hours as needed for mild pain. Take per bottle as needed for pain   albuterol (2.5 MG/3ML) 0.083% nebulizer solution Commonly known as:  PROVENTIL Take 3 mLs (2.5 mg total) by nebulization every 6 (six) hours as needed for wheezing or shortness of breath.   ALPRAZolam 0.5 MG tablet Commonly known as:  XANAX Take 1/2 tablet by mouth daily as needed for anxiety   atorvastatin 40 MG tablet Commonly known as:  LIPITOR Take 1 tablet by mouth at bedtime.   azithromycin 250 MG tablet Commonly known as:  ZITHROMAX Take 1 tablet (250 mg total) by mouth daily.   benzonatate  200 MG capsule Commonly known as:  TESSALON Take 400 mg by mouth at bedtime as needed for cough.   buPROPion 150 MG 24 hr tablet Commonly known as:  WELLBUTRIN XL Take 300 mg by mouth every morning.   DALIRESP 500 MCG Tabs tablet Generic drug:  roflumilast Take 1 tablet by mouth daily.   docusate sodium 100 MG capsule Commonly known as:  COLACE Take 100 mg by mouth every morning.   DULoxetine 60 MG capsule Commonly known as:  CYMBALTA Take 60 mg by mouth every morning.   furosemide 40 MG tablet Commonly known as:  LASIX Take 80 mg by mouth 2 (two) times daily.   gabapentin 600 MG tablet Commonly known as:  NEURONTIN Take 600 mg by mouth 3 (three) times daily.   HYDROcodone-acetaminophen 5-325 MG tablet Commonly known as:  NORCO/VICODIN Take 1 tablet by mouth every 4 (four) hours as needed for moderate pain. Reported on 12/12/2015   insulin glargine 100 UNIT/ML  injection Commonly known as:  LANTUS Inject 0.66 mLs (66 Units total) into the skin daily before breakfast.   insulin lispro 100 UNIT/ML injection Commonly known as:  HUMALOG Inject 8 Units into the skin 2 (two) times daily.   irbesartan 150 MG tablet Commonly known as:  AVAPRO Take 1 tablet (150 mg total) by mouth daily. KEEP OV.   iron polysaccharides 150 MG capsule Commonly known as:  NIFEREX Take 1 capsule (150 mg total) by mouth 2 (two) times daily. What changed:  when to take this   levalbuterol 45 MCG/ACT inhaler Commonly known as:  XOPENEX HFA Inhale 1-2 puffs into the lungs every 4 (four) hours as needed for wheezing.   multivitamin tablet Take 1 tablet by mouth every morning.   omeprazole 20 MG capsule Commonly known as:  PRILOSEC Take 20 mg by mouth daily before breakfast.   ONE TOUCH ULTRA TEST test strip Generic drug:  glucose blood 1 each by Other route 3 (three) times daily.   OXYGEN 4 L at baseline, Increase oxygen to 6L with acitivity   polyethylene glycol packet Commonly known  as:  MIRALAX / GLYCOLAX Take 17 g by mouth daily.   potassium chloride 10 MEQ tablet Commonly known as:  K-DUR,KLOR-CON Take 3 tablets (30 mEq total) by mouth 2 (two) times daily.   predniSONE 20 MG tablet Commonly known as:  DELTASONE Take 2 tabs daily for 2 days, 1 tab daily for 2 days, half tab daily for 2 days, then stop   rivaroxaban 20 MG Tabs tablet Commonly known as:  XARELTO Take 1 tablet (20 mg total) by mouth daily with supper. Restart on Wednesday May 11 , 2016   SYMBICORT 160-4.5 MCG/ACT inhaler Generic drug:  budesonide-formoterol INHALE 2 PUFFS BY MOUTH TWICE DAILY   tiotropium 18 MCG inhalation capsule Commonly known as:  SPIRIVA Place 1 capsule (18 mcg total) into inhaler and inhale every morning.   verapamil 360 MG 24 hr capsule Commonly known as:  VERELAN PM TAKE ONE CAPSULE BY MOUTH DAILY      Follow-up Information    Simonne Maffucci, MD Follow up in 2 week(s).   Specialty:  Pulmonary Disease Contact information: [place] [address] [phone]        Marton Redwood, MD Follow up today.   Specialty:  Internal Medicine Why:  as needed Contact information: [address] [place] [address] [phone]          Allergies  Allergen Reactions  . Aspirin Shortness Of Breath  . Nsaids Shortness Of Breath    Consultations: none   Procedures/Studies: Dg Ribs Unilateral W/chest Left  Result Date: 01/22/2016 CLINICAL DATA:  Fall 3 days ago, left rib pain EXAM: LEFT RIBS AND CHEST - 3+ VIEW COMPARISON:  12/28/2014 FINDINGS: Three views left ribs submitted. Study is limited by diffuse osteopenia. Mild perihilar bronchitic changes. Streaky bilateral basilar atelectasis, scarring or infiltrate. Question nondisplaced fracture of the left seventh rib. Question nondisplaced fracture of the left 8 rib. No pneumothorax. IMPRESSION: Study is limited by diffuse osteopenia. Mild perihilar bronchitic changes. Streaky bilateral basilar  atelectasis, scarring or infiltrate. Question nondisplaced fracture of the left seventh rib. Question nondisplaced fracture of the left 8 rib. No pneumothorax. Electronically Signed   By: Lahoma Crocker M.D.   On: 01/22/2016 11:57   Ct Chest W Contrast  Result Date: 01/22/2016 CLINICAL DATA:  Increasing shortness of breath. Left rib fractures post fall. History of COPD. EXAM: CT CHEST WITH CONTRAST  TECHNIQUE: Multidetector CT imaging of the chest was performed during intravenous contrast administration. CONTRAST:  75mL ISOVUE-300 IOPAMIDOL (ISOVUE-300) INJECTION 61% COMPARISON:  Chest radiograph 01/22/2016, CT of the chest April 23, 202013 FINDINGS: Cardiovascular: The heart is normal in size. There is no pericardial effusion. There is no evidence of central pulmonary embolus or thoracic aortic dissection. The segmental and subsegmental branches of the lower lobes are poorly evaluated due to breathing motion artifact. Mediastinum/Nodes: Stable borderline superior mediastinal lymphadenopathy with left pretracheal lymph node 11 in Euan Wandler axis an ABI window node measuring 11 mm in Mckynzi Cammon axis. Lungs/Pleura: Upper lobe predominant traction bronchiectasis, and mild emphysematous changes. Hyperventilatory changes of bilateral lung bases and right middle lobe. Upper Abdomen: No acute findings. Musculoskeletal: No chest wall mass or suspicious bone lesions identified. IMPRESSION: No evidence of central pulmonary embolus or thoracic dissection. COPD. Electronically Signed   By: Fidela Salisbury M.D.   On: 01/22/2016 18:09    Subjective:  Was feeling better but this morning she was being pulled up in bed so that the nurse tech and nurse could change her linens and she felt her left chest wall get strained.  She has been in pain ever since and having more difficulty breathing.    Discharge Exam: Vitals:   01/24/16 1349 01/24/16 1356  BP:  (!) 102/48  Pulse: (!) 106 (!) 104  Resp: 19   Temp: 98.6 F (37 C)    Vitals:    01/24/16 0512 01/24/16 0827 01/24/16 1349 01/24/16 1356  BP: (!) 99/50   (!) 102/48  Pulse: 93  (!) 106 (!) 104  Resp: 18  19   Temp: 97.7 F (36.5 C)  98.6 F (37 C)   TempSrc: Oral     SpO2: 92% 94% 93%   Weight:      Height:        General exam:  Adult female.  SCM retractions and cyanotic lips.  HEENT:  NCAT, MMM Respiratory system:  Diminished bilateral BS, no focal rales or rhonchi, high pitched expiratory wheeze Cardiovascular system:  Rate improved from yesterday, 90s at time of exam, normal S1/S2. No murmurs, rubs, gallops or clicks.  Warm extremities Gastrointestinal system: Normal active bowel sounds, soft, nondistended, nontender. MSK:  Normal tone and bulk, no lower extremity edema Neuro:  Grossly intact    The results of significant diagnostics from this hospitalization (including imaging, microbiology, ancillary and laboratory) are listed below for reference.     Microbiology: No results found for this or any previous visit (from the past 240 hour(s)).   Labs: BNP (last 3 results)  Recent Labs  07/20/15 0944 11/19/15 1025 01/22/16 1632  BNP 26.8 30.3 0000000   Basic Metabolic Panel:  Recent Labs Lab 01/22/16 1635 01/23/16 0520  NA 135 137  K 3.5 4.1  CL 96* 101  CO2 29 29  GLUCOSE 102* 210*  BUN 13 15  CREATININE 0.86 0.81  CALCIUM 9.3 9.2  MG 2.3  --   PHOS 4.3  --    Liver Function Tests:  Recent Labs Lab 01/23/16 0520  AST 19  ALT 15  ALKPHOS 59  BILITOT 0.4  PROT 6.3*  ALBUMIN 3.1*   No results for input(s): LIPASE, AMYLASE in the last 168 hours. No results for input(s): AMMONIA in the last 168 hours. CBC:  Recent Labs Lab 01/22/16 1635 01/23/16 0520  WBC 10.1 5.3  NEUTROABS 6.2 4.6  HGB 10.6* 9.9*  HCT 35.9* 34.3*  MCV 83.5 83.1  PLT 274 260  Cardiac Enzymes:  Recent Labs Lab 01/22/16 1635  TROPONINI <0.03   BNP: Invalid input(s): POCBNP CBG:  Recent Labs Lab 01/23/16 1607 01/23/16 2033  01/24/16 0802 01/24/16 1155 01/24/16 1649  GLUCAP 144* 160* 94 212* 122*   D-Dimer No results for input(s): DDIMER in the last 72 hours. Hgb A1c No results for input(s): HGBA1C in the last 72 hours. Lipid Profile No results for input(s): CHOL, HDL, LDLCALC, TRIG, CHOLHDL, LDLDIRECT in the last 72 hours. Thyroid function studies No results for input(s): TSH, T4TOTAL, T3FREE, THYROIDAB in the last 72 hours.  Invalid input(s): FREET3 Anemia work up  Recent Labs  01/23/16 0520 01/23/16 0521  VITAMINB12 1,073*  --   FOLATE  --  49.5  FERRITIN 6*  --   TIBC 371  --   IRON 14*  --   RETICCTPCT 2.0  --    Urinalysis    Component Value Date/Time   COLORURINE YELLOW 07/09/2013 0058   APPEARANCEUR CLEAR 07/09/2013 0058   LABSPEC 1.011 07/09/2013 0058   PHURINE 5.5 07/09/2013 0058   GLUCOSEU NEGATIVE 07/09/2013 0058   HGBUR NEGATIVE 07/09/2013 0058   BILIRUBINUR NEGATIVE 07/09/2013 0058   KETONESUR NEGATIVE 07/09/2013 0058   PROTEINUR NEGATIVE 07/09/2013 0058   UROBILINOGEN 0.2 07/09/2013 0058   NITRITE NEGATIVE 07/09/2013 0058   LEUKOCYTESUR NEGATIVE 07/09/2013 0058   Sepsis Labs Invalid input(s): PROCALCITONIN,  WBC,  LACTICIDVEN   Time coordinating discharge: Over 30 minutes  SIGNED:   Janece Canterbury, MD  [facility] 01/24/2016, 6:14 PM Pager   If 7PM-7AM, please contact night-coverage [email or web address] Password [email or web address]

## 2016-01-24 NOTE — Progress Notes (Signed)
Physical Therapy Treatment Patient Details Name: Destiny Shelton MRN: [id number] DOB: 1946/03/20 Today's Date: 01/24/2016    History of Present Illness 70 y.o. female with medical history significant of COPD on home oxygen usually at 4 LPM, type 2 diabetes, anemia, anxiety, paroxysmal atrial fibrillation, carotid stenosis, depression, diverticulosis, fibromyalgia, GERD, gastritis, hyperlipidemia, hypertension, osteoarthritis, osteoporosis, sleep apnea not on CPAP who came to the emergency department due to left-sided chest wall pain.    PT Comments    Pt sitting EOB Indep on arrival on 6 lts nasal ( uses 4 lts at home).  Sats 90% with HR 109.  Assisted with amb to and from the bathroom only due to O2 sats decreasing to low 80s and 3/4 DOE.  Much coughing and chest congestion noted after activity.  Pt required extended rest break between activity.  Assisted back to bed as pt c/o MAX fatigue from session.  Pt  Plans to return home with spouse.    Follow Up Recommendations  Home health PT;Supervision for mobility/OOB     Equipment Recommendations  None recommended by PT    Recommendations for Other Services       Precautions / Restrictions Precautions Precautions: Fall Precaution Comments: home O2 4 lts and recent falls Restrictions Weight Bearing Restrictions: No    Mobility  Bed Mobility Overal bed mobility: Needs Assistance         Sit to supine: Min assist   General bed mobility comments: min assist for B LE up onto bed then Max Assist + 2 to scoot to [facility]  Transfers Overall transfer level: Needs assistance Equipment used: Rolling walker (2 wheeled) Transfers: Sit to/from [facility] Sit to Stand: Min guard Stand pivot transfers: Min guard       General transfer comment: verbal cues for hand placement and increased time to rise.  Assisted off elevated bed and on/off lower surface level toilet.    Ambulation/Gait Ambulation/Gait assistance: Min  guard;Supervision Ambulation Distance (Feet): 16 Feet (8 feet x 2 to and from bathroom only)   Gait Pattern/deviations: Step-through pattern;Decreased stride length;Shuffle Gait velocity: decreased   General Gait Details: very limited amb distance due to O2 sats decreasing from 90% at rest on 6 lts nasal to 80% with activity with noted 3/4 DOE.  Pt required extended sitting rest break on toilet.     Stairs            Wheelchair Mobility    Modified Rankin (Stroke Patients Only)       Balance                                    Cognition Arousal/Alertness: Awake/alert Behavior During Therapy: WFL for tasks assessed/performed Overall Cognitive Status: Within Functional Limits for tasks assessed                      Exercises      General Comments        Pertinent Vitals/Pain Pain Assessment: Faces Faces Pain Scale: Hurts a little bit Pain Location: L side esp when coughing Pain Descriptors / Indicators: Grimacing Pain Intervention(s): Monitored during session;Repositioned    Home Living                      Prior Function            PT Goals (current goals can now be found in  the care plan section)      Frequency  Min 3X/week    PT Plan Current plan remains appropriate    Co-evaluation             End of Session Equipment Utilized During Treatment: Gait belt Activity Tolerance: Treatment limited secondary to medical complications (Comment);Patient limited by fatigue Patient left: in bed;with family/visitor present;with call bell/phone within reach;with bed alarm set     Time: [id number] PT Time Calculation (min) (ACUTE ONLY): 25 min  Charges:  $Gait Training: 8-22 mins $Therapeutic Activity: 8-22 mins                    G Codes:      Rica Koyanagi  PTA [facility]  Acute  Rehab Pager      [phone]

## 2016-01-25 ENCOUNTER — Ambulatory Visit (HOSPITAL_COMMUNITY)
Admission: RE | Admit: 2016-01-25 | Discharge: 2016-01-25 | Disposition: A | Discharge status: Home / Self Care | Payer: Medicare Other | Source: Ambulatory Visit | Attending: Cardiology | Admitting: Cardiology

## 2016-01-25 ENCOUNTER — Encounter (HOSPITAL_COMMUNITY): Payer: Self-pay

## 2016-01-25 VITALS — BP 120/54 | HR 81 | Wt 218.2 lb

## 2016-01-25 DIAGNOSIS — I10 Essential (primary) hypertension: Secondary | ICD-10-CM

## 2016-01-25 DIAGNOSIS — Z833 Family history of diabetes mellitus: Secondary | ICD-10-CM | POA: Insufficient documentation

## 2016-01-25 DIAGNOSIS — Z9981 Dependence on supplemental oxygen: Secondary | ICD-10-CM | POA: Diagnosis not present

## 2016-01-25 DIAGNOSIS — E119 Type 2 diabetes mellitus without complications: Secondary | ICD-10-CM | POA: Diagnosis not present

## 2016-01-25 DIAGNOSIS — Z7901 Long term (current) use of anticoagulants: Secondary | ICD-10-CM | POA: Diagnosis not present

## 2016-01-25 DIAGNOSIS — R000 Tachycardia, unspecified: Secondary | ICD-10-CM | POA: Diagnosis not present

## 2016-01-25 DIAGNOSIS — Z823 Family history of stroke: Secondary | ICD-10-CM | POA: Insufficient documentation

## 2016-01-25 DIAGNOSIS — Z8249 Family history of ischemic heart disease and other diseases of the circulatory system: Secondary | ICD-10-CM | POA: Insufficient documentation

## 2016-01-25 DIAGNOSIS — I5032 Chronic diastolic (congestive) heart failure: Secondary | ICD-10-CM | POA: Diagnosis not present

## 2016-01-25 DIAGNOSIS — Z7951 Long term (current) use of inhaled steroids: Secondary | ICD-10-CM | POA: Insufficient documentation

## 2016-01-25 DIAGNOSIS — E669 Obesity, unspecified: Secondary | ICD-10-CM | POA: Insufficient documentation

## 2016-01-25 DIAGNOSIS — Z6836 Body mass index (BMI) 36.0-36.9, adult: Secondary | ICD-10-CM | POA: Diagnosis not present

## 2016-01-25 DIAGNOSIS — Z79899 Other long term (current) drug therapy: Secondary | ICD-10-CM | POA: Insufficient documentation

## 2016-01-25 DIAGNOSIS — I110 Hypertensive heart disease with heart failure: Secondary | ICD-10-CM | POA: Diagnosis not present

## 2016-01-25 DIAGNOSIS — I4891 Unspecified atrial fibrillation: Secondary | ICD-10-CM | POA: Diagnosis present

## 2016-01-25 DIAGNOSIS — Z87891 Personal history of nicotine dependence: Secondary | ICD-10-CM | POA: Diagnosis not present

## 2016-01-25 DIAGNOSIS — I272 Other secondary pulmonary hypertension: Secondary | ICD-10-CM | POA: Insufficient documentation

## 2016-01-25 DIAGNOSIS — J449 Chronic obstructive pulmonary disease, unspecified: Secondary | ICD-10-CM | POA: Diagnosis not present

## 2016-01-25 DIAGNOSIS — G4733 Obstructive sleep apnea (adult) (pediatric): Secondary | ICD-10-CM | POA: Insufficient documentation

## 2016-01-25 DIAGNOSIS — I480 Paroxysmal atrial fibrillation: Secondary | ICD-10-CM

## 2016-01-25 DIAGNOSIS — Z794 Long term (current) use of insulin: Secondary | ICD-10-CM | POA: Insufficient documentation

## 2016-01-25 NOTE — Patient Instructions (Signed)
Your physician recommends that you schedule a follow-up appointment in: 6 weeks  

## 2016-01-26 NOTE — Progress Notes (Signed)
Patient ID: Destiny Shelton, female   DOB: 1946-05-19, 70 y.o.   MRN: [id number]  PCP: Dr. Brigitte Pulse HF Cardiology: Dr. Aundra Dubin  70 yo with history of paroxysmal atrial fibrillation, chronic diastolic CHF with restrictive hemodynamics on 2013 RHC, COPD on home oxygen, inappropriate sinus tachycardia, and concern for intracardiac shunting presents for CHF clinic evaluation.  She has been followed for several years by both cardiology and pulmonology.  She is a prior smoker and has been diagnosed with COPD.  Interestingly, her PFTs in 10/16 showed improvement, suggesting mild obstructive airways disease.  However, she still requires home oxygen.  She had transcranial dopplers in 2013 suggesting a medium-sized right to left shunt.  This led to an extensive workup.  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  Cardiac MRI was unremarkable, no evidence for shunt lesion.  RHC/LHC showed no coronary disease but it did show restrictive hemodynamics and volume overload.  Qp/Qs from this study was 1.27/1, suggesting a relatively small left to right shunt.  A definite shunt lesion was never identified.    Echo in 2/17 showed normal LV size and systolic function and normal RV.  I also did a RHC in 2/17: on higher dose diuretics, she had mild pulmonary hypertension and normal PCWP.  There was no evidence for a shunt lesion with Qp/Qs 0.97.   She returns for HF followup.  Earlier in 8/17, she feel onto her left side with fractured ribs on the left.  She was admitted with pain and dyspnea due to splinting.  Pain was treated and she was given a course of steroids.  Pain is slowly improving.  Breathing is also improving with decreased pleuritic pain.  Minimal DOE walking on flat ground.  Chronic orthopnea, has slept in a recliner since the 1990s.  No BRBPR or melena.    ECG: sinus tachy 111, iRBBB  Labs (9/16): digoxin 0.6, K 4.3, creatinine 0.85 Labs (1/17): K 3.9, creatinine 0.96, hgb 8.9 Labs (3/17): K  4.2, creatinine 0.96 Labs (6/17): K 4.2, creatinine 1.03 Labs (8/17): K 4.1, creatinine 0.81, HCT 34.3  PMH: 1. Atrial fibrillation: Paroxysmal.  2. HTN 3. COPD: Home oxygen.  PFTs (10/16) with FVC 77%, FEV1 68%, TLC 89% => mild obstruction (improved from the past).   4. Type II diabetes 5. Obesity 6. Inappropriate sinus tachycardia 7. OSA: Does not use CPAP, uses oxygen at night.  8. Chronic diastolic CHF: RHC/LHC in 0000000 with no significant CAD, mean RA 19, RV 46/18, PA 45/29 mean 37, mean PCWP 22, CI 4.1, PVR 1.4 WU => restrictive hemodynamics with no evidence for constriction;  Qp/Qs 1.27/1; saturation run difficult to interpret.  TEE (10/13): EF 55-60%, LVH, mild MR, bubble study showed no evidence for immediate R=>L bubble crossing, small amount of bubbles ended up in left heart > 5 seconds after injection suggesting possible intrapulmonary shunting; no shunt by color doppler.  Cardiac MRI (8/13) with EF 59%, no LGE, normal RV size/systolic function => interpreted as normal.  RHC 07/04/2015: RA mean 9, RV 45/10, PA 41/20 mean 30, PCWP mean 13, no step up on shunt run,cardiac output (Fick) 6.7 cardiac index (Fick) 3.3, PVR 2.5 WU, Qp/Qs = 0.97. Echo (2/17) with EF 55-60%, mild LVH, mild MR, normal RV size and systolic function.  9. ?Intracardiac shunt: Transcranial dopplers in 10/13 were suggestive of medium-sized right to left shunt.  Bubble study on TEE in 10/13 showed no evidence for immediate R=>L bubble crossing, small amount of bubbles ended  up in left heart > 5 seconds after injection suggesting possible intrapulmonary shunting; no shunt by color doppler. Cardiac MRI showed no evidence for shunting.  Cardiac cath in 2013 actually was suggestive of a small left to right shunt.  Cardiac cath in 2/17 NOT suggestive of significant shunt, Qp/Qs 0.97.   SH: Married, prior heavy smoker (quit 1996).  No ETOH.   Family History  Problem Relation Age of Onset  . Heart disease Mother   . Stroke  Mother   . Heart disease Father   . Heart disease Brother   . Stroke Brother   . Skin cancer Brother   . Colon polyps Sister   . Diabetes Sister   . Diabetes Brother   . Irritable bowel syndrome Daughter   . Colon cancer Neg Hx    ROS: All systems reviewed and negative except as per HPI.   Current Outpatient Prescriptions  Medication Sig Dispense Refill  . acetaminophen (TYLENOL) 500 MG tablet 500 mg every 6 (six) hours as needed for mild pain. Take per bottle as needed for pain    . albuterol (PROVENTIL) (2.5 MG/3ML) 0.083% nebulizer solution Take 3 mLs (2.5 mg total) by nebulization every 6 (six) hours as needed for wheezing or shortness of breath. 75 mL 3  . ALPRAZolam (XANAX) 0.5 MG tablet Take 1/2 tablet by mouth daily as needed for anxiety    . atorvastatin (LIPITOR) 40 MG tablet Take 1 tablet by mouth at bedtime.     . benzonatate (TESSALON) 200 MG capsule Take 400 mg by mouth at bedtime as needed for cough.     Marland Kitchen buPROPion (WELLBUTRIN XL) 150 MG 24 hr tablet Take 300 mg by mouth every morning.     Marland Kitchen DALIRESP 500 MCG TABS tablet Take 1 tablet by mouth daily.  6  . docusate sodium (COLACE) 100 MG capsule Take 100 mg by mouth every morning.     . DULoxetine (CYMBALTA) 60 MG capsule Take 60 mg by mouth every morning.     . furosemide (LASIX) 40 MG tablet Take 80 mg by mouth 2 (two) times daily.    Marland Kitchen gabapentin (NEURONTIN) 600 MG tablet Take 600 mg by mouth 3 (three) times daily.     Marland Kitchen HYDROcodone-acetaminophen (NORCO/VICODIN) 5-325 MG per tablet Take 1 tablet by mouth every 4 (four) hours as needed for moderate pain. Reported on 12/12/2015    . insulin glargine (LANTUS) 100 UNIT/ML injection Inject 0.66 mLs (66 Units total) into the skin daily before breakfast.    . insulin lispro (HUMALOG) 100 UNIT/ML injection Inject 8 Units into the skin 2 (two) times daily.    . irbesartan (AVAPRO) 150 MG tablet Take 1 tablet (150 mg total) by mouth daily. KEEP OV. 90 tablet 0  . iron  polysaccharides (NIFEREX) 150 MG capsule Take 1 capsule (150 mg total) by mouth 2 (two) times daily. 60 capsule 0  . levalbuterol (XOPENEX HFA) 45 MCG/ACT inhaler Inhale 1-2 puffs into the lungs every 4 (four) hours as needed for wheezing. 1 Inhaler 12  . Multiple Vitamin (MULTIVITAMIN) tablet Take 1 tablet by mouth every morning.     Marland Kitchen omeprazole (PRILOSEC) 20 MG capsule Take 20 mg by mouth daily before breakfast.     . ONE TOUCH ULTRA TEST test strip 1 each by Other route 3 (three) times daily.   4  . OXYGEN 4 L at baseline, Increase oxygen to 6L with acitivity    . polyethylene glycol (MIRALAX / GLYCOLAX) packet  Take 17 g by mouth daily.    . potassium chloride SA (K-DUR,KLOR-CON) 10 MEQ tablet Take 3 tablets (30 mEq total) by mouth 2 (two) times daily. 180 tablet 3  . rivaroxaban (XARELTO) 20 MG TABS tablet Take 1 tablet (20 mg total) by mouth daily with supper. Restart on Wednesday May 11 , 2016 30 tablet   . SYMBICORT 160-4.5 MCG/ACT inhaler INHALE 2 PUFFS BY MOUTH TWICE DAILY 10.2 g 5  . tiotropium (SPIRIVA) 18 MCG inhalation capsule Place 1 capsule (18 mcg total) into inhaler and inhale every morning. 30 capsule 11  . verapamil (VERELAN PM) 360 MG 24 hr capsule TAKE ONE CAPSULE BY MOUTH DAILY 30 capsule 4  . predniSONE (DELTASONE) 20 MG tablet Take 2 tabs daily for 2 days, 1 tab daily for 2 days, half tab daily for 2 days, then stop (Patient not taking: Reported on 01/25/2016) 7 tablet 0   No current facility-administered medications for this encounter.    BP (!) 120/54   Pulse 81   Wt 218 lb 4 oz (99 kg)   SpO2 93% Comment: on 4L of O2  BMI 36.32 kg/m  General: NAD, obese, wearing oxygen.  Neck: JVP 7 cm, no thyromegaly or thyroid nodule.  Lungs: Slight crackles at bases bilaterally.  CV: Nondisplaced PMI.  Heart regular S1/S2, no S3/S4, 2/6 HSM LLSB.  Trace ankle edema.  No carotid bruit.  Normal pedal pulses.  Abdomen: Soft, nontender, no hepatosplenomegaly, no distention.    Skin: Intact without lesions or rashes.  Neurologic: Alert and oriented x 3.  Psych: Normal affect. Extremities: No clubbing or cyanosis.  HEENT: Normal.   Assessment/Plan: 1. COPD: On home oxygen, 5L at rest and 6L with exertion.  PFTs in 10/16 actually showed only mild obstruction (improved).  Breathing has improved in the past with increased diuresis, suggesting that CHF plays a significant role in her symptoms in addition to COPD.  2. Atrial fibrillation: Paroxysmal, CHADSVASC = 4.  She remains in NSR today and has not felt atrial fibrillation recently.  She is mildly tachycardic, this has been chronic (suspected inappropriate sinus tachycardia).  - She will continue Xarelto.  - She can continue verapamil.     3. Chronic diastolic CHF: NYHA class III symptoms, likely due to combination of CHF and COPD.  She looks near-euvolemic.  - Continue Lasix 80 mg po bid.   4. ?Shunt lesion: She had transcranial dopplers in 2013 suggesting a medium-sized right to left shunt.  This led to an extensive workup.  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  Cardiac MRI was unremarkable, no evidence for shunt lesion.  RHC/LHC in 2013 showed no coronary disease but it did show restrictive hemodynamics and volume overload.  Qp/Qs from this study was 1.27/1, suggesting a relatively small left to right shunt.  A definite shunt lesion was never identified.  Based on prior workup, it seems most likely that the right to left shunting by transcranial dopplers and TEE bubble study was due to pulmonary AVMs.  RHC repeated in 2/17 did not show a significant shunt lesion, with Qp/Qs 0.97.  5. Inappropriate sinus tachycardia: HR around 110 today.  Had discussed using Corlanor in the past but interacts with verapamil.  6. Pulmonary hypertension: Mild pulmonary hypertension on RHC with PVR only 2.5 WU.  Probably due to COPD and elevated left atrial pressure.  No specific treatment other than  diuresis.   Loralie Champagne 01/26/2016

## 2016-02-07 ENCOUNTER — Other Ambulatory Visit (HOSPITAL_COMMUNITY): Payer: Self-pay | Admitting: *Deleted

## 2016-02-08 ENCOUNTER — Encounter (HOSPITAL_COMMUNITY)
Admission: RE | Admit: 2016-02-08 | Discharge: 2016-02-08 | Disposition: A | Discharge status: Home / Self Care | Payer: Medicare Other | Source: Ambulatory Visit | Attending: Internal Medicine | Admitting: Internal Medicine

## 2016-02-08 DIAGNOSIS — M810 Age-related osteoporosis without current pathological fracture: Secondary | ICD-10-CM | POA: Insufficient documentation

## 2016-02-08 MED ORDER — ZOLEDRONIC ACID 5 MG/100ML IV SOLN
5.0000 mg | Freq: Once | INTRAVENOUS | Status: AC
  Administered 2016-02-08: 5 mg via INTRAVENOUS

## 2016-02-08 MED ORDER — ZOLEDRONIC ACID 5 MG/100ML IV SOLN
INTRAVENOUS | Status: AC
  Administered 2016-02-08: 5 mg via INTRAVENOUS
  Filled 2016-02-08: qty 100

## 2016-02-10 ENCOUNTER — Emergency Department (HOSPITAL_COMMUNITY): Payer: Medicare Other

## 2016-02-10 ENCOUNTER — Inpatient Hospital Stay (HOSPITAL_COMMUNITY)
Admission: EM | Admit: 2016-02-10 | Discharge: 2016-02-15 | DRG: 190 | Disposition: A | Discharge status: Home Health | Payer: Medicare Other | Attending: Internal Medicine | Admitting: Internal Medicine

## 2016-02-10 ENCOUNTER — Encounter (HOSPITAL_COMMUNITY): Payer: Self-pay

## 2016-02-10 DIAGNOSIS — R000 Tachycardia, unspecified: Secondary | ICD-10-CM | POA: Diagnosis present

## 2016-02-10 DIAGNOSIS — E11649 Type 2 diabetes mellitus with hypoglycemia without coma: Secondary | ICD-10-CM | POA: Diagnosis present

## 2016-02-10 DIAGNOSIS — Z823 Family history of stroke: Secondary | ICD-10-CM | POA: Diagnosis not present

## 2016-02-10 DIAGNOSIS — E119 Type 2 diabetes mellitus without complications: Secondary | ICD-10-CM | POA: Diagnosis present

## 2016-02-10 DIAGNOSIS — J9620 Acute and chronic respiratory failure, unspecified whether with hypoxia or hypercapnia: Secondary | ICD-10-CM

## 2016-02-10 DIAGNOSIS — K219 Gastro-esophageal reflux disease without esophagitis: Secondary | ICD-10-CM | POA: Diagnosis present

## 2016-02-10 DIAGNOSIS — M549 Dorsalgia, unspecified: Secondary | ICD-10-CM | POA: Diagnosis present

## 2016-02-10 DIAGNOSIS — E1149 Type 2 diabetes mellitus with other diabetic neurological complication: Secondary | ICD-10-CM | POA: Diagnosis present

## 2016-02-10 DIAGNOSIS — I272 Other secondary pulmonary hypertension: Secondary | ICD-10-CM | POA: Diagnosis present

## 2016-02-10 DIAGNOSIS — Z833 Family history of diabetes mellitus: Secondary | ICD-10-CM

## 2016-02-10 DIAGNOSIS — J9621 Acute and chronic respiratory failure with hypoxia: Secondary | ICD-10-CM | POA: Diagnosis present

## 2016-02-10 DIAGNOSIS — M797 Fibromyalgia: Secondary | ICD-10-CM | POA: Diagnosis present

## 2016-02-10 DIAGNOSIS — M810 Age-related osteoporosis without current pathological fracture: Secondary | ICD-10-CM | POA: Diagnosis present

## 2016-02-10 DIAGNOSIS — G4730 Sleep apnea, unspecified: Secondary | ICD-10-CM | POA: Diagnosis present

## 2016-02-10 DIAGNOSIS — E785 Hyperlipidemia, unspecified: Secondary | ICD-10-CM | POA: Diagnosis present

## 2016-02-10 DIAGNOSIS — Z6836 Body mass index (BMI) 36.0-36.9, adult: Secondary | ICD-10-CM

## 2016-02-10 DIAGNOSIS — Z90710 Acquired absence of both cervix and uterus: Secondary | ICD-10-CM

## 2016-02-10 DIAGNOSIS — I480 Paroxysmal atrial fibrillation: Secondary | ICD-10-CM | POA: Diagnosis present

## 2016-02-10 DIAGNOSIS — I4711 Inappropriate sinus tachycardia, so stated: Secondary | ICD-10-CM | POA: Diagnosis present

## 2016-02-10 DIAGNOSIS — F411 Generalized anxiety disorder: Secondary | ICD-10-CM | POA: Diagnosis present

## 2016-02-10 DIAGNOSIS — Z9981 Dependence on supplemental oxygen: Secondary | ICD-10-CM | POA: Diagnosis not present

## 2016-02-10 DIAGNOSIS — J441 Chronic obstructive pulmonary disease with (acute) exacerbation: Principal | ICD-10-CM | POA: Diagnosis present

## 2016-02-10 DIAGNOSIS — I5032 Chronic diastolic (congestive) heart failure: Secondary | ICD-10-CM | POA: Diagnosis not present

## 2016-02-10 DIAGNOSIS — F329 Major depressive disorder, single episode, unspecified: Secondary | ICD-10-CM | POA: Diagnosis present

## 2016-02-10 DIAGNOSIS — I10 Essential (primary) hypertension: Secondary | ICD-10-CM | POA: Diagnosis not present

## 2016-02-10 DIAGNOSIS — F32A Depression, unspecified: Secondary | ICD-10-CM | POA: Diagnosis present

## 2016-02-10 DIAGNOSIS — E162 Hypoglycemia, unspecified: Secondary | ICD-10-CM | POA: Diagnosis not present

## 2016-02-10 DIAGNOSIS — J432 Centrilobular emphysema: Secondary | ICD-10-CM | POA: Diagnosis present

## 2016-02-10 DIAGNOSIS — R0602 Shortness of breath: Secondary | ICD-10-CM | POA: Diagnosis present

## 2016-02-10 DIAGNOSIS — Z87891 Personal history of nicotine dependence: Secondary | ICD-10-CM | POA: Diagnosis not present

## 2016-02-10 DIAGNOSIS — D72829 Elevated white blood cell count, unspecified: Secondary | ICD-10-CM | POA: Diagnosis present

## 2016-02-10 DIAGNOSIS — Z8249 Family history of ischemic heart disease and other diseases of the circulatory system: Secondary | ICD-10-CM | POA: Diagnosis not present

## 2016-02-10 DIAGNOSIS — G8929 Other chronic pain: Secondary | ICD-10-CM | POA: Diagnosis present

## 2016-02-10 DIAGNOSIS — I110 Hypertensive heart disease with heart failure: Secondary | ICD-10-CM | POA: Diagnosis present

## 2016-02-10 DIAGNOSIS — J9611 Chronic respiratory failure with hypoxia: Secondary | ICD-10-CM | POA: Diagnosis not present

## 2016-02-10 DIAGNOSIS — J449 Chronic obstructive pulmonary disease, unspecified: Secondary | ICD-10-CM | POA: Diagnosis not present

## 2016-02-10 DIAGNOSIS — E161 Other hypoglycemia: Secondary | ICD-10-CM | POA: Diagnosis not present

## 2016-02-10 DIAGNOSIS — J9610 Chronic respiratory failure, unspecified whether with hypoxia or hypercapnia: Secondary | ICD-10-CM | POA: Diagnosis present

## 2016-02-10 DIAGNOSIS — Z7901 Long term (current) use of anticoagulants: Secondary | ICD-10-CM | POA: Diagnosis not present

## 2016-02-10 DIAGNOSIS — Z794 Long term (current) use of insulin: Secondary | ICD-10-CM

## 2016-02-10 HISTORY — DX: Heart failure, unspecified: I50.9

## 2016-02-10 LAB — CBC WITH DIFFERENTIAL/PLATELET
Basophils Absolute: 0 10*3/uL (ref 0.0–0.1)
Basophils Relative: 0 %
EOS ABS: 0 10*3/uL (ref 0.0–0.7)
EOS PCT: 0 %
HCT: 39.2 % (ref 36.0–46.0)
Hemoglobin: 11.6 g/dL — ABNORMAL LOW (ref 12.0–15.0)
LYMPHS ABS: 1.1 10*3/uL (ref 0.7–4.0)
LYMPHS PCT: 9 %
MCH: 26.3 pg (ref 26.0–34.0)
MCHC: 29.6 g/dL — AB (ref 30.0–36.0)
MCV: 88.9 fL (ref 78.0–100.0)
MONO ABS: 0.7 10*3/uL (ref 0.1–1.0)
MONOS PCT: 5 %
Neutro Abs: 10.8 10*3/uL — ABNORMAL HIGH (ref 1.7–7.7)
Neutrophils Relative %: 86 %
PLATELETS: 207 10*3/uL (ref 150–400)
RBC: 4.41 MIL/uL (ref 3.87–5.11)
RDW: 21 % — AB (ref 11.5–15.5)
WBC: 12.6 10*3/uL — AB (ref 4.0–10.5)

## 2016-02-10 LAB — COMPREHENSIVE METABOLIC PANEL
ALBUMIN: 3.9 g/dL (ref 3.5–5.0)
ALT: 25 U/L (ref 14–54)
AST: 29 U/L (ref 15–41)
Alkaline Phosphatase: 84 U/L (ref 38–126)
Anion gap: 7 (ref 5–15)
BUN: 14 mg/dL (ref 6–20)
CHLORIDE: 105 mmol/L (ref 101–111)
CO2: 29 mmol/L (ref 22–32)
CREATININE: 0.92 mg/dL (ref 0.44–1.00)
Calcium: 8.2 mg/dL — ABNORMAL LOW (ref 8.9–10.3)
GFR calc Af Amer: >60 mL/min (ref >60)
GLUCOSE: 127 mg/dL — AB (ref 65–99)
POTASSIUM: 3.8 mmol/L (ref 3.5–5.1)
Sodium: 141 mmol/L (ref 135–145)
Total Bilirubin: 0.4 mg/dL (ref 0.3–1.2)
Total Protein: 7.3 g/dL (ref 6.5–8.1)

## 2016-02-10 LAB — GLUCOSE, CAPILLARY
GLUCOSE-CAPILLARY: 147 mg/dL — AB (ref 65–99)
Glucose-Capillary: 280 mg/dL — ABNORMAL HIGH (ref 65–99)

## 2016-02-10 LAB — I-STAT CG4 LACTIC ACID, ED
LACTIC ACID, VENOUS: 1.76 mmol/L (ref 0.5–1.9)
Lactic Acid, Venous: 2.21 mmol/L (ref 0.5–1.9)

## 2016-02-10 LAB — CBG MONITORING, ED
GLUCOSE-CAPILLARY: 56 mg/dL — AB (ref 65–99)
Glucose-Capillary: 119 mg/dL — ABNORMAL HIGH (ref 65–99)
Glucose-Capillary: 149 mg/dL — ABNORMAL HIGH (ref 65–99)

## 2016-02-10 LAB — I-STAT TROPONIN, ED
TROPONIN I, POC: 0 ng/mL (ref 0.00–0.08)
Troponin i, poc: 0 ng/mL (ref 0.00–0.08)

## 2016-02-10 LAB — URINALYSIS, ROUTINE W REFLEX MICROSCOPIC
Bilirubin Urine: NEGATIVE
GLUCOSE, UA: NEGATIVE mg/dL
HGB URINE DIPSTICK: NEGATIVE
KETONES UR: NEGATIVE mg/dL
LEUKOCYTES UA: NEGATIVE
Nitrite: NEGATIVE
PROTEIN: NEGATIVE mg/dL
Specific Gravity, Urine: 1.011 (ref 1.005–1.030)
pH: 5.5 (ref 5.0–8.0)

## 2016-02-10 LAB — MRSA PCR SCREENING: MRSA by PCR: NEGATIVE

## 2016-02-10 LAB — LIPASE, BLOOD: LIPASE: 22 U/L (ref 11–51)

## 2016-02-10 LAB — BRAIN NATRIURETIC PEPTIDE: B NATRIURETIC PEPTIDE 5: 17.7 pg/mL (ref 0.0–100.0)

## 2016-02-10 MED ORDER — BUPROPION HCL ER (XL) 150 MG PO TB24
150.0000 mg | ORAL_TABLET | Freq: Every day | ORAL | Status: DC
  Administered 2016-02-11 – 2016-02-15 (×5): 150 mg via ORAL
  Filled 2016-02-10 (×5): qty 1

## 2016-02-10 MED ORDER — DEXTROSE 50 % IV SOLN
INTRAVENOUS | Status: AC
  Filled 2016-02-10: qty 50

## 2016-02-10 MED ORDER — ACETAMINOPHEN 500 MG PO TABS
500.0000 mg | ORAL_TABLET | Freq: Four times a day (QID) | ORAL | Status: DC | PRN
  Administered 2016-02-11 – 2016-02-15 (×4): 500 mg via ORAL
  Filled 2016-02-10 (×4): qty 1

## 2016-02-10 MED ORDER — FUROSEMIDE 40 MG PO TABS
80.0000 mg | ORAL_TABLET | Freq: Two times a day (BID) | ORAL | Status: DC
  Administered 2016-02-10 – 2016-02-15 (×9): 80 mg via ORAL
  Filled 2016-02-10 (×9): qty 2

## 2016-02-10 MED ORDER — INSULIN GLARGINE 100 UNIT/ML ~~LOC~~ SOLN
30.0000 [IU] | Freq: Every day | SUBCUTANEOUS | Status: DC
  Administered 2016-02-11 – 2016-02-12 (×2): 30 [IU] via SUBCUTANEOUS
  Filled 2016-02-10 (×3): qty 0.3

## 2016-02-10 MED ORDER — IPRATROPIUM BROMIDE 0.02 % IN SOLN
0.5000 mg | Freq: Once | RESPIRATORY_TRACT | Status: AC
  Administered 2016-02-10: 0.5 mg via RESPIRATORY_TRACT
  Filled 2016-02-10: qty 2.5

## 2016-02-10 MED ORDER — BUDESONIDE 0.25 MG/2ML IN SUSP
0.2500 mg | Freq: Two times a day (BID) | RESPIRATORY_TRACT | Status: DC
  Administered 2016-02-10 – 2016-02-14 (×8): 0.25 mg via RESPIRATORY_TRACT
  Filled 2016-02-10 (×8): qty 2

## 2016-02-10 MED ORDER — SODIUM CHLORIDE 0.9% FLUSH
3.0000 mL | Freq: Two times a day (BID) | INTRAVENOUS | Status: DC
  Administered 2016-02-10 – 2016-02-15 (×10): 3 mL via INTRAVENOUS

## 2016-02-10 MED ORDER — RIVAROXABAN 20 MG PO TABS
20.0000 mg | ORAL_TABLET | Freq: Every day | ORAL | Status: DC
  Administered 2016-02-10 – 2016-02-14 (×5): 20 mg via ORAL
  Filled 2016-02-10 (×5): qty 1

## 2016-02-10 MED ORDER — DM-GUAIFENESIN ER 30-600 MG PO TB12
1.0000 | ORAL_TABLET | Freq: Two times a day (BID) | ORAL | Status: DC
  Administered 2016-02-10 – 2016-02-15 (×11): 1 via ORAL
  Filled 2016-02-10 (×7): qty 1
  Filled 2016-02-10: qty 2
  Filled 2016-02-10 (×3): qty 1

## 2016-02-10 MED ORDER — ALPRAZOLAM 0.25 MG PO TABS
0.2500 mg | ORAL_TABLET | Freq: Every day | ORAL | Status: DC | PRN
  Administered 2016-02-12 – 2016-02-14 (×3): 0.25 mg via ORAL
  Filled 2016-02-10 (×3): qty 1

## 2016-02-10 MED ORDER — INSULIN ASPART 100 UNIT/ML ~~LOC~~ SOLN
0.0000 [IU] | Freq: Three times a day (TID) | SUBCUTANEOUS | Status: DC
  Administered 2016-02-10: 5 [IU] via SUBCUTANEOUS
  Administered 2016-02-11 – 2016-02-12 (×4): 2 [IU] via SUBCUTANEOUS
  Administered 2016-02-12: 3 [IU] via SUBCUTANEOUS
  Administered 2016-02-12 – 2016-02-13 (×3): 2 [IU] via SUBCUTANEOUS
  Administered 2016-02-13 – 2016-02-14 (×2): 5 [IU] via SUBCUTANEOUS
  Administered 2016-02-14 (×2): 2 [IU] via SUBCUTANEOUS

## 2016-02-10 MED ORDER — ONDANSETRON HCL 4 MG PO TABS: 4.0000 mg | ORAL_TABLET | Freq: Four times a day (QID) | ORAL | Status: DC | PRN

## 2016-02-10 MED ORDER — ALBUTEROL (5 MG/ML) CONTINUOUS INHALATION SOLN
10.0000 mg/h | INHALATION_SOLUTION | Freq: Once | RESPIRATORY_TRACT | Status: AC
  Administered 2016-02-10: 10 mg/h via RESPIRATORY_TRACT

## 2016-02-10 MED ORDER — ORAL CARE MOUTH RINSE
15.0000 mL | Freq: Two times a day (BID) | OROMUCOSAL | Status: DC
  Administered 2016-02-10 – 2016-02-15 (×10): 15 mL via OROMUCOSAL

## 2016-02-10 MED ORDER — ALBUTEROL (5 MG/ML) CONTINUOUS INHALATION SOLN
10.0000 mg/h | INHALATION_SOLUTION | Freq: Once | RESPIRATORY_TRACT | Status: AC
  Administered 2016-02-10: 10 mg/h via RESPIRATORY_TRACT
  Filled 2016-02-10: qty 20

## 2016-02-10 MED ORDER — METHYLPREDNISOLONE SODIUM SUCC 125 MG IJ SOLR
60.0000 mg | Freq: Two times a day (BID) | INTRAMUSCULAR | Status: DC
  Administered 2016-02-10 – 2016-02-12 (×4): 60 mg via INTRAVENOUS
  Filled 2016-02-10 (×4): qty 2

## 2016-02-10 MED ORDER — DULOXETINE HCL 30 MG PO CPEP
60.0000 mg | ORAL_CAPSULE | Freq: Every morning | ORAL | Status: DC
  Administered 2016-02-11 – 2016-02-15 (×5): 60 mg via ORAL
  Filled 2016-02-10 (×5): qty 2

## 2016-02-10 MED ORDER — POLYETHYLENE GLYCOL 3350 17 G PO PACK
17.0000 g | PACK | Freq: Every day | ORAL | Status: DC
  Administered 2016-02-11 – 2016-02-14 (×4): 17 g via ORAL
  Filled 2016-02-10 (×5): qty 1

## 2016-02-10 MED ORDER — IPRATROPIUM-ALBUTEROL 0.5-2.5 (3) MG/3ML IN SOLN
3.0000 mL | Freq: Four times a day (QID) | RESPIRATORY_TRACT | Status: DC
  Administered 2016-02-10 – 2016-02-11 (×4): 3 mL via RESPIRATORY_TRACT
  Filled 2016-02-10 (×4): qty 3

## 2016-02-10 MED ORDER — DEXTROSE 50 % IV SOLN
1.0000 | Freq: Once | INTRAVENOUS | Status: AC
  Administered 2016-02-10: 50 mL via INTRAVENOUS

## 2016-02-10 MED ORDER — SODIUM CHLORIDE 0.9 % IV BOLUS (SEPSIS)
500.0000 mL | Freq: Once | INTRAVENOUS | Status: AC
Start: 2016-02-10 — End: 2016-02-11
  Administered 2016-02-10: 500 mL via INTRAVENOUS

## 2016-02-10 MED ORDER — ATORVASTATIN CALCIUM 40 MG PO TABS
40.0000 mg | ORAL_TABLET | Freq: Every day | ORAL | Status: DC
  Administered 2016-02-10 – 2016-02-14 (×5): 40 mg via ORAL
  Filled 2016-02-10 (×6): qty 1

## 2016-02-10 MED ORDER — INSULIN ASPART 100 UNIT/ML ~~LOC~~ SOLN
0.0000 [IU] | Freq: Every day | SUBCUTANEOUS | Status: DC
  Administered 2016-02-12 – 2016-02-13 (×2): 2 [IU] via SUBCUTANEOUS

## 2016-02-10 MED ORDER — DEXTROSE 5 % IV SOLN
500.0000 mg | Freq: Once | INTRAVENOUS | Status: AC
  Administered 2016-02-10: 500 mg via INTRAVENOUS
  Filled 2016-02-10: qty 500

## 2016-02-10 MED ORDER — LEVOFLOXACIN IN D5W 500 MG/100ML IV SOLN
500.0000 mg | INTRAVENOUS | Status: DC
  Administered 2016-02-11: 500 mg via INTRAVENOUS
  Filled 2016-02-10: qty 100

## 2016-02-10 MED ORDER — IRBESARTAN 150 MG PO TABS
150.0000 mg | ORAL_TABLET | Freq: Every day | ORAL | Status: DC
  Administered 2016-02-12 – 2016-02-15 (×4): 150 mg via ORAL
  Filled 2016-02-10 (×5): qty 1

## 2016-02-10 MED ORDER — ONDANSETRON HCL 4 MG/2ML IJ SOLN: 4.0000 mg | Freq: Four times a day (QID) | INTRAMUSCULAR | Status: DC | PRN

## 2016-02-10 MED ORDER — ROFLUMILAST 500 MCG PO TABS
500.0000 ug | ORAL_TABLET | Freq: Every day | ORAL | Status: DC
  Administered 2016-02-11 – 2016-02-15 (×5): 500 ug via ORAL
  Filled 2016-02-10 (×5): qty 1

## 2016-02-10 MED ORDER — GABAPENTIN 300 MG PO CAPS
600.0000 mg | ORAL_CAPSULE | Freq: Three times a day (TID) | ORAL | Status: DC
  Administered 2016-02-10 – 2016-02-15 (×14): 600 mg via ORAL
  Filled 2016-02-10 (×15): qty 2

## 2016-02-10 MED ORDER — HYDROCODONE-ACETAMINOPHEN 5-325 MG PO TABS
1.0000 | ORAL_TABLET | ORAL | Status: DC | PRN
  Administered 2016-02-11 – 2016-02-15 (×5): 1 via ORAL
  Filled 2016-02-10 (×5): qty 1

## 2016-02-10 MED ORDER — SODIUM CHLORIDE 0.9 % IV BOLUS (SEPSIS)
1000.0000 mL | Freq: Once | INTRAVENOUS | Status: AC
  Administered 2016-02-10: 1000 mL via INTRAVENOUS

## 2016-02-10 MED ORDER — VERAPAMIL HCL ER 180 MG PO TBCR
360.0000 mg | EXTENDED_RELEASE_TABLET | Freq: Every day | ORAL | Status: DC
  Administered 2016-02-11 – 2016-02-15 (×5): 360 mg via ORAL
  Filled 2016-02-10 (×5): qty 2

## 2016-02-10 NOTE — ED Notes (Signed)
Hypoglycemia on assessment.  Meds ordered and given.

## 2016-02-10 NOTE — ED Triage Notes (Signed)
Per EMS, pt from home.  Pt c/o weakness and shortness of breath.  Pt is a diabetic with COPD.  Pt woke up this morning with a cbg of 91.  Pt took her insulin and did not eat. CBG decreased to 41.  Pt was altered.  Decreased LOC and diaphoretic.  Pt given 100cc D10 IV, 18g Lt forearm. CBG 199.  A&O x 4. Pt COPD home oxygen at 4L with increase to 6L with activity.  Pt congested.  Albuterol 5mg , 125mg  solumedrol given in route.  Initial SP02 86% with Ashley.  Nebulizer tx, sats improved 97%.  Pt denies pain.  Pt takes fluid pills for CHF but claims decreased urinary output with edema to rt lower extremity.  HX of UTI, a-fib.  Vitals:  114/46, hr 104, resp 26, 97% of neb.

## 2016-02-10 NOTE — ED Notes (Signed)
Verbal order per RN to in and out cath this pt

## 2016-02-10 NOTE — ED Provider Notes (Signed)
[facility] DEPT Provider Note   CSN: [id number] Arrival date & time: 02/10/16  [id number]     History   Chief Complaint Chief Complaint  Patient presents with  . Weakness  . Shortness of Breath    HPI Destiny Shelton is a 70 y.o. female.  HPI 2 days of shortness of breath, increased cough, coughing up green sputum.  Feels severe generalized weakness, nausea, decreased appettite. Admitted 8/22 after fall and bruised ribs (CT does not  have rib fx) and had COPD exacerbation. Still reports soreness to this area of ribs, however no other chest pain. No known fevers at home. Took insulin after eating very little for breakfast and sugar dropped    Past Medical History:  Diagnosis Date  . Adenomatous colon polyp   . ALLERGIC RHINITIS   . Anemia    iron deficient  . Anxiety   . Atrial tachycardia (HCC)    Mostly Sinus Tachycardia  . Back pain, chronic   . Carotid stenosis   . CHF (congestive heart failure) ([place])   . Community acquired pneumonia - Recent Admission (07/2013) 07/08/2013  . COPD (chronic obstructive pulmonary disease) ([place])    Requiring Home O2 at 4L  . Depression   . Diabetes mellitus type 2 with neurological manifestations ([place])    On Insulin  . Diverticulosis of colon   . External hemorrhoids   . Fibromyalgia    "pain in arms and shoulders."  . Gastritis   . GERD (gastroesophageal reflux disease)   . Headache(784.0)    tension  . Hyperlipemia   . Hypertension   . Inappropriate sinus tachycardia ([place])   . Morbidly obese ([place])   . Nephrolithiasis   . Neuromuscular disorder ([place])    diabetic neuropathy  . Osteoarthritis   . Osteoporosis   . PAF (paroxysmal atrial fibrillation) ([place])   . 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  . Sleep apnea    no cpap machine  02 at 4l/min all the time  . Urosepsis 05/26/2012    Patient Active Problem List   Diagnosis Date Noted  . COPD exacerbation ([place]) 01/22/2016  . Biomechanical lesion of rib cage 01/22/2016  . Anemia  01/22/2016  . Chronic diastolic CHF (congestive heart failure) ([place]) 12/12/2015  . Morbid obesity ([place]) 12/29/2014  . Benign neoplasm of sigmoid colon   . Chronic diastolic heart failure of unknown etiology ([place]) 08/17/2013  . Chronic respiratory failure ([place]) 04/21/2013  . Polyposis coli-attenuated 01/24/2013  . Sigmoid tubular adenoma with high-grade dysplasia 10/01/2012  . Gastric polyp 10/01/2012  . Schatzki's ring 10/01/2012  . PAF (paroxysmal atrial fibrillation) ([place]) 05/29/2012  . Pulmonary hypertension ([place]) 04/15/2012  . Inappropriate sinus tachycardia ([place])   . Obesity, Class II, BMI 35-39.9, with comorbidity (HCC)     Class: Chronic  . History of failed moderate sedation 06/10/2011  . Pulmonary nodules 09/11/2010  . COPD GOLD II  09/11/2010  . CHRONIC DYSPNEA 07/26/2009  . Diabetes mellitus type 2 with neurological manifestations ([place]) 07/29/2007    Class: Diagnosis of  . DIABETIC PERIPHERAL NEUROPATHY 07/29/2007  . Anxiety state 07/29/2007  . Depression 07/29/2007  . Essential hypertension 07/29/2007  . NEPHROLITHIASIS 07/29/2007  . OSTEOARTHRITIS 07/29/2007  . Backache 07/29/2007  . OSTEOPOROSIS 07/29/2007  . Personal history of colonic polyps 04/28/2007    Past Surgical History:  Procedure Laterality Date  . ABDOMINAL HYSTERECTOMY  1978  . APPENDECTOMY  1963  . CARDIAC CATHETERIZATION N/A 07/04/2015   Procedure: Right Heart Cath;  Surgeon: Elby Showers  Aundra Dubin, MD;  Location: [facility];  Service: Cardiovascular;  Laterality: N/A;  . CHOLECYSTECTOMY  1963  . COLONOSCOPY N/A 12/21/2012   Procedure: COLONOSCOPY;  Surgeon: Gatha Mayer, MD;  Location: [facility];  Service: Endoscopy;  Laterality: N/A;  . COLONOSCOPY N/A 11/17/2013   Procedure: COLONOSCOPY;  Surgeon: Gatha Mayer, MD;  Location: [facility];  Service: Endoscopy;  Laterality: N/A;  . COLONOSCOPY W/ BIOPSIES AND POLYPECTOMY  07/22/2007, 04/28/2007   2009: diverticulosis, 2008: diverticulosis,  adenomatous polyps  . COLONOSCOPY WITH PROPOFOL N/A 10/01/2012   Procedure: COLONOSCOPY WITH PROPOFOL;  Surgeon: Gatha Mayer, MD;  Location: [facility];  Service: Endoscopy;  Laterality: N/A;  . COLONOSCOPY WITH PROPOFOL N/A 10/10/2014   Procedure: COLONOSCOPY WITH PROPOFOL;  Surgeon: Gatha Mayer, MD;  Location: [facility];  Service: Endoscopy;  Laterality: N/A;  . ESOPHAGOGASTRODUODENOSCOPY (EGD) WITH PROPOFOL N/A 10/01/2012   Procedure: ESOPHAGOGASTRODUODENOSCOPY (EGD) WITH PROPOFOL;  Surgeon: Gatha Mayer, MD;  Location: [facility];  Service: Endoscopy;  Laterality: N/A;  . HOT HEMOSTASIS N/A 12/21/2012   Procedure: HOT HEMOSTASIS (ARGON PLASMA COAGULATION/BICAP);  Surgeon: Gatha Mayer, MD;  Location: [facility];  Service: Endoscopy;  Laterality: N/A;  . HOT HEMOSTASIS N/A 10/10/2014   Procedure: HOT HEMOSTASIS (ARGON PLASMA COAGULATION/BICAP);  Surgeon: Gatha Mayer, MD;  Location: [facility];  Service: Endoscopy;  Laterality: N/A;  . LEFT AND RIGHT HEART CATHETERIZATION WITH CORONARY ANGIOGRAM  12/19/2011   Procedure: LEFT AND RIGHT HEART CATHETERIZATION WITH CORONARY ANGIOGRAM;  Surgeon: Leonie Man, MD;  Location: [facility];  Service: Cardiovascular;;  . RIGHT AND LEFT CARDIAC CATHETERIZATION  July 2013   RAP: 22 mmHg, and RVP: 46/16 mmHg, RV EDP 18; L. BP 110/11 mmHg, LVEDP 20 mmHg;  PAP 45/29 mmHg (mean 37 mmHg); PCWP 29/22 mmHg - 26 mmHg; no significant coronary disease  . TEE WITHOUT CARDIOVERSION  03/24/2012   Procedure: TRANSESOPHAGEAL ECHOCARDIOGRAM (TEE);  Surgeon: Pixie Casino, MD;  Location: [facility] OR;  Service: Cardiovascular;  Laterality: N/A;  TEE with [place]  . TOTAL KNEE ARTHROPLASTY  1997   right  . UPPER GASTROINTESTINAL ENDOSCOPY  04/09/2006   w/Dilation, gastritis    OB History    No data available       Home Medications    Prior to Admission medications   Medication Sig Start Date End Date Taking? Authorizing Provider  acetaminophen  (TYLENOL) 500 MG tablet 500 mg every 6 (six) hours as needed for mild pain. Take per bottle as needed for pain   Yes Historical Provider, MD  ALPRAZolam Duanne Moron) 0.5 MG tablet Take 0.25 mg by mouth daily as needed for anxiety or sleep.    Yes Historical Provider, MD  atorvastatin (LIPITOR) 40 MG tablet Take 40 mg by mouth at bedtime.  06/06/13  Yes Historical Provider, MD  benzonatate (TESSALON) 200 MG capsule Take 200 mg by mouth at bedtime as needed for cough.    Yes Historical Provider, MD  buPROPion (WELLBUTRIN XL) 150 MG 24 hr tablet Take 150 mg by mouth every morning.    Yes Historical Provider, MD  DALIRESP 500 MCG TABS tablet Take 500 mcg by mouth daily.  07/04/15  Yes Historical Provider, MD  DULoxetine (CYMBALTA) 60 MG capsule Take 60 mg by mouth every morning.    Yes Historical Provider, MD  furosemide (LASIX) 40 MG tablet Take 80 mg by mouth 2 (two) times daily. 07/13/13  Yes Marton Redwood, MD  gabapentin (NEURONTIN) 600 MG tablet  Take 600 mg by mouth 3 (three) times daily.    Yes Historical Provider, MD  HYDROcodone-acetaminophen (NORCO/VICODIN) 5-325 MG per tablet Take 1 tablet by mouth every 4 (four) hours as needed for moderate pain. Reported on 12/12/2015   Yes Historical Provider, MD  insulin glargine (LANTUS) 100 UNIT/ML injection Inject 0.66 mLs (66 Units total) into the skin daily before breakfast. 01/24/16  Yes Janece Canterbury, MD  insulin lispro (HUMALOG) 100 UNIT/ML injection Inject 8 Units into the skin 2 (two) times daily.   Yes Historical Provider, MD  irbesartan (AVAPRO) 150 MG tablet Take 1 tablet (150 mg total) by mouth daily. KEEP OV. 01/11/16  Yes Leonie Man, MD  iron polysaccharides (NIFEREX) 150 MG capsule Take 1 capsule (150 mg total) by mouth 2 (two) times daily. 01/24/16  Yes Janece Canterbury, MD  levalbuterol [facility] HFA) 45 MCG/ACT inhaler Inhale 1-2 puffs into the lungs every 4 (four) hours as needed for wheezing. 06/01/12  Yes Marton Redwood, MD  Multiple Vitamin  (MULTIVITAMIN) tablet Take 1 tablet by mouth every morning.    Yes Historical Provider, MD  omeprazole (PRILOSEC) 20 MG capsule Take 20 mg by mouth daily.    Yes Historical Provider, MD  ONE TOUCH ULTRA TEST test strip 1 each by Other route 3 (three) times daily.  07/10/14  Yes Historical Provider, MD  OXYGEN 4 L at baseline, Increase oxygen to 6L with acitivity   Yes Historical Provider, MD  polyethylene glycol (MIRALAX / GLYCOLAX) packet Take 17 g by mouth daily.   Yes Historical Provider, MD  potassium chloride SA (K-DUR,KLOR-CON) 10 MEQ tablet Take 3 tablets (30 mEq total) by mouth 2 (two) times daily. 11/19/15  Yes Larey Dresser, MD  rivaroxaban (XARELTO) 20 MG TABS tablet Take 1 tablet (20 mg total) by mouth daily with supper. Restart on Wednesday May 11 , 2016 10/10/14  Yes Gatha Mayer, MD  SYMBICORT 160-4.5 MCG/ACT inhaler INHALE 2 PUFFS BY MOUTH TWICE DAILY 11/12/15  Yes Juanito Doom, MD  tiotropium (SPIRIVA) 18 MCG inhalation capsule Place 1 capsule (18 mcg total) into inhaler and inhale every morning. 08/10/15  Yes Juanito Doom, MD  verapamil (VERELAN PM) 360 MG 24 hr capsule TAKE ONE CAPSULE BY MOUTH DAILY Patient taking differently: TAKE 360 MG BY MOUTH DAILY 12/17/15  Yes Leonie Man, MD  albuterol (PROVENTIL) (2.5 MG/3ML) 0.083% nebulizer solution Take 3 mLs (2.5 mg total) by nebulization every 6 (six) hours as needed for wheezing or shortness of breath. Patient not taking: Reported on 02/10/2016 06/28/14   Kathee Delton, MD  predniSONE (DELTASONE) 20 MG tablet Take 2 tabs daily for 2 days, 1 tab daily for 2 days, half tab daily for 2 days, then stop Patient not taking: Reported on 01/25/2016 01/24/16   Janece Canterbury, MD    Family History Family History  Problem Relation Age of Onset  . Heart disease Mother   . Stroke Mother   . Heart disease Father   . Heart disease Brother   . Stroke Brother   . Skin cancer Brother   . Colon polyps Sister   . Diabetes Sister     . Diabetes Brother   . Irritable bowel syndrome Daughter   . Colon cancer Neg Hx     Social History Social History  Substance Use Topics  . Smoking status: Former Smoker    Packs/day: 2.00    Years: 30.00    Types: Cigarettes    Quit date:  06/02/1994  . Smokeless tobacco: Never Used  . Alcohol use No     Allergies   Aspirin and Nsaids   Review of Systems Review of Systems  Constitutional: Positive for appetite change and fatigue. Negative for fever.  HENT: Negative for sore throat.   Eyes: Negative for visual disturbance.  Respiratory: Positive for cough, shortness of breath and wheezing.   Cardiovascular: Negative for chest pain (over sore ribs left lateral, reports had rib fx from fall 8.22).  Gastrointestinal: Positive for constipation and nausea. Negative for abdominal pain and diarrhea.  Genitourinary: Positive for dysuria. Negative for difficulty urinating (not urinating as much).  Musculoskeletal: Negative for back pain and neck pain.  Skin: Negative for rash.  Neurological: Negative for syncope and headaches.     Physical Exam Updated Vital Signs BP (!) 88/21   Pulse (!) 101   Temp 98.2 F (36.8 C) (Oral)   Resp (!) 22   Ht 5\' 5"  (1.651 m)   Wt 222 lb 0.1 oz (100.7 kg)   SpO2 92%   BMI 36.94 kg/m   Physical Exam  Constitutional: She is oriented to person, place, and time. She appears well-developed and well-nourished. No distress.  On nonrebreather on arrival, appears dyspneic, however able to speak in full sentences  HENT:  Head: Normocephalic and atraumatic.  Eyes: Conjunctivae and EOM are normal.  Neck: Normal range of motion.  Cardiovascular: Regular rhythm, normal heart sounds and intact distal pulses.  Tachycardia present.  Exam reveals no gallop and no friction rub.   No murmur heard. Pulmonary/Chest: She is in respiratory distress. She has decreased breath sounds (diminished breath sounds throughout). She has wheezes (occasional). She has  rhonchi (diffuse). She has no rales.  Abdominal: Soft. She exhibits no distension. There is no tenderness. There is no guarding.  Musculoskeletal: She exhibits no edema or tenderness.  Neurological: She is alert and oriented to person, place, and time.  Skin: Skin is warm and dry. No rash noted. She is not diaphoretic. No erythema.  Nursing note and vitals reviewed.    ED Treatments / Results  Labs (all labs ordered are listed, but only abnormal results are displayed) Labs Reviewed  COMPREHENSIVE METABOLIC PANEL - Abnormal; Notable for the following:       Result Value   Glucose, Bld 127 (*)    Calcium 8.2 (*)    All other components within normal limits  CBC WITH DIFFERENTIAL/PLATELET - Abnormal; Notable for the following:    WBC 12.6 (*)    Hemoglobin 11.6 (*)    MCHC 29.6 (*)    RDW 21.0 (*)    Neutro Abs 10.8 (*)    All other components within normal limits  GLUCOSE, CAPILLARY - Abnormal; Notable for the following:    Glucose-Capillary 280 (*)    All other components within normal limits  GLUCOSE, CAPILLARY - Abnormal; Notable for the following:    Glucose-Capillary 147 (*)    All other components within normal limits  CBG MONITORING, ED - Abnormal; Notable for the following:    Glucose-Capillary 56 (*)    All other components within normal limits  CBG MONITORING, ED - Abnormal; Notable for the following:    Glucose-Capillary 119 (*)    All other components within normal limits  CBG MONITORING, ED - Abnormal; Notable for the following:    Glucose-Capillary 149 (*)    All other components within normal limits  I-STAT CG4 LACTIC ACID, ED - Abnormal; Notable for the following:  Lactic Acid, Venous 2.21 (*)    All other components within normal limits  CULTURE, BLOOD (ROUTINE X 2)  CULTURE, BLOOD (ROUTINE X 2)  MRSA PCR SCREENING  URINE CULTURE  URINALYSIS, ROUTINE W REFLEX MICROSCOPIC (NOT AT [facility])  LIPASE, BLOOD  BRAIN NATRIURETIC PEPTIDE  COMPREHENSIVE METABOLIC  PANEL  CBC  I-STAT CG4 LACTIC ACID, ED  I-STAT TROPOININ, ED  CBG MONITORING, ED  I-STAT TROPOININ, ED  Randolm Idol, ED    EKG  EKG Interpretation  Date/Time:  Sunday February 10 2016 10:05:02 EDT Ventricular Rate:  99 PR Interval:    QRS Duration: 111 QT Interval:  358 QTC Calculation: 460 R Axis:   76 Text Interpretation:  Sinus rhythm RSR' in V1 or V2, right VCD or RVH No significant change since last tracing Confirmed by [facility] MD, Tiro ([id number]) on 02/10/2016 10:50:14 AM       Radiology Dg Chest 1 View  Result Date: 02/10/2016 CLINICAL DATA:  Hypoglycemia with shortness of breath and weakness, decreased O2 sats history of COPD EXAM: CHEST 1 VIEW COMPARISON:  01/22/2016 FINDINGS: AP portable semi erect image of the chest. Mild diffuse interstitial prominence as before. Streaky subsegmental atelectasis versus scarring in the left lung base. No acute consolidation or effusion. Cardiomediastinal silhouette upper limits of normal with atherosclerosis of the aorta. No pneumothorax. IMPRESSION: 1. No acute consolidation or effusion 2. Diffuse interstitial prominence with subsegmental atelectasis or scarring in the left base 3. Atherosclerotic vascular calcifications of the aorta Electronically Signed   By: Donavan Foil M.D.   On: 02/10/2016 11:49    Procedures Procedures (including critical care time)  Medications Ordered in ED Medications  sodium chloride flush (NS) 0.9 % injection 3 mL (3 mLs Intravenous Given 02/10/16 2200)  ondansetron (ZOFRAN) tablet 4 mg (not administered)    Or  ondansetron (ZOFRAN) injection 4 mg (not administered)  ipratropium-albuterol (DUONEB) 0.5-2.5 (3) MG/3ML nebulizer solution 3 mL (3 mLs Nebulization Given 02/10/16 2056)  budesonide (PULMICORT) nebulizer solution 0.25 mg (0.25 mg Nebulization Given 02/10/16 2056)  methylPREDNISolone sodium succinate (SOLU-MEDROL) 125 mg/2 mL injection 60 mg (60 mg Intravenous Given 02/10/16 1550)    levofloxacin (LEVAQUIN) IVPB 500 mg (not administered)  insulin glargine (LANTUS) injection 30 Units (not administered)  irbesartan (AVAPRO) tablet 150 mg (not administered)  verapamil (CALAN-SR) CR tablet 360 mg (not administered)  roflumilast (DALIRESP) tablet 500 mcg (not administered)  polyethylene glycol (MIRALAX / GLYCOLAX) packet 17 g (not administered)  rivaroxaban (XARELTO) tablet 20 mg (20 mg Oral Given 02/10/16 1743)  buPROPion (WELLBUTRIN XL) 24 hr tablet 150 mg (not administered)  furosemide (LASIX) tablet 80 mg (80 mg Oral Given 02/10/16 1743)  acetaminophen (TYLENOL) tablet 500 mg (not administered)  atorvastatin (LIPITOR) tablet 40 mg (40 mg Oral Given 02/10/16 2101)  HYDROcodone-acetaminophen (NORCO/VICODIN) 5-325 MG per tablet 1 tablet (not administered)  DULoxetine (CYMBALTA) DR capsule 60 mg (not administered)  ALPRAZolam (XANAX) tablet 0.25 mg (not administered)  gabapentin (NEURONTIN) capsule 600 mg (600 mg Oral Given 02/10/16 2101)  dextromethorphan-guaiFENesin (MUCINEX DM) 30-600 MG per 12 hr tablet 1 tablet (1 tablet Oral Given 02/10/16 2101)  insulin aspart (novoLOG) injection 0-9 Units (5 Units Subcutaneous Given 02/10/16 1747)  insulin aspart (novoLOG) injection 0-5 Units (0 Units Subcutaneous Not Given 02/10/16 2200)  MEDLINE mouth rinse (15 mLs Mouth Rinse Given 02/10/16 2200)  sodium chloride 0.9 % bolus 500 mL (500 mLs Intravenous Given 02/11/16 0020)  dextrose 50 % solution 50 mL (50 mLs Intravenous Given 02/10/16 1010)  albuterol (PROVENTIL,VENTOLIN) solution continuous neb (10 mg/hr Nebulization Given 02/10/16 1039)  ipratropium (ATROVENT) nebulizer solution 0.5 mg (0.5 mg Nebulization Given 02/10/16 1039)  azithromycin (ZITHROMAX) 500 mg in dextrose 5 % 250 mL IVPB (0 mg Intravenous Stopped 02/10/16 1413)  albuterol (PROVENTIL,VENTOLIN) solution continuous neb (10 mg/hr Nebulization Given 02/10/16 1423)  sodium chloride 0.9 % bolus 1,000 mL (1,000 mLs Intravenous New  Bag/Given 02/10/16 1415)  sodium chloride 0.9 % bolus 500 mL (500 mLs Intravenous Given 02/10/16 2100)     Initial Impression / Assessment and Plan / ED Course  I have reviewed the triage vital signs and the nursing notes.  Pertinent labs & imaging results that were available during my care of the patient were reviewed by me and considered in my medical decision making (see chart for details). CRITICAL CARE: COPD exacerbation requiring multiple doses of continuous medications Performed by: Alvino Chapel   Total critical care time: 27minutes  Critical care time was exclusive of separately billable procedures and treating other patients.  Critical care was necessary to treat or prevent imminent or life-threatening deterioration.  Critical care was time spent personally by me on the following activities: development of treatment plan with patient and/or surrogate as well as nursing, discussions with consultants, evaluation of patient's response to treatment, examination of patient, obtaining history from patient or surrogate, ordering and performing treatments and interventions, ordering and review of laboratory studies, ordering and review of radiographic studies, pulse oximetry and re-evaluation of patient's condition.  Clinical Course   70 year old female with a history of COPD on 4 L of oxygen at home with increased to 6 L with exertion, diabetes, hyperlipidemia, hypertension, paroxysmal atrial fibrillation on several toe, CHF, recent fall with rib contusions presents with concern for generalized weakness, nausea, increased shortness of breath and cough. Patient hypoxic to 86% on home oxygen on arrival of EMS. She had taken insulin this morning with very little to eat, and was hypoglycemic. She received teaching D10 with EMS with improvement, however glucose decreased again to the 50s on arrival to the emergency department. She was given additional D50, and was able to maintain her  glucose following this.  Regarding her dyspnea, suspect COPD exacerbation as etiology of her symptoms. Chest x-ray shows no signs of pneumonia, just heart failure. BNP is 17. She is on xarelto, and have low suspicion for pulmonary embolus. Troponin is negative, and she does not have any chest pain to suggest ACS. She received Solu-Medrol with EMS, was placed on continuous albuterol and Atrovent with initial improvement while on treatment, however had increased dyspnea while off of her treatment, and additional albuterol was ordered. She was also given azithromycin for concern for COPD exacerbation. Initial lactate was negative, and second lactate of 2 is likely secondary to albuterol and respiratory distress, and  doubt sepsis. She did report urinary symptoms, urinalysis was negative. Tachycardia worsened likely secondary to albuterol. Will give 1L NS. Patient will be admitted to stepdown unit for further care.   Final Clinical Impressions(s) / ED Diagnoses   Final diagnoses:  COPD exacerbation ([place])  Acute on chronic respiratory failure, unspecified whether with hypoxia or hypercapnia [facility])    New Prescriptions Current Discharge Medication List       Gareth Morgan, MD 02/11/16 0110

## 2016-02-10 NOTE — H&P (Signed)
History and Physical    Destiny Shelton [id number] DOB: [birth year] DOA: 02/10/2016  PCP: Marton Redwood, MD  Outpatient Specialists: Pulmonology, Dr. Lake Bells Patient coming from: home  Chief Complaint: Shortness of breath  HPI: Destiny Shelton is a 70 y.o. female with medical history significant of COPD with chronic hypoxic respiratory failure on 4 L nasal baseline, hypertension, diabetes mellitus, paroxysmal atrial fibrillation on chronic Xarelto, presents to the emergency room with a chief complaint of shortness of breath. Patient has been feeling more short of breath over the last 3-4 days, worse on the last 2 days, she has noticed increased cough which is without and with green sputum, and has had several coughing spells that she was barely able to get her breath. She denies any blood sputum. Her husband is at bedside, tells me that this morning he got very concerned as patient has had so much difficulty breathing that she became somnolent and she appeared confused. He decided to call EMS. Patient denies any chest pain, she has no fever or chills. She denies palpitations. She has no abdominal pain, nausea, vomiting or diarrhea. She noticed this morning that her sugar was low after giving herself her insulin. She did not eat much this morning   ED Course: In the emergency room, patient was found to be hypoxic on her home oxygen 4 L, requiring additional oxygen, she is tachypneic and tachycardic, labs show mild leukocytosis otherwise unremarkable, chest x-ray without clear evidence for pneumonia. Given respiratory distress, [facility] asked to admit this patient to step down for COPD exacerbation   Review of Systems: As per HPI otherwise 10 point review of systems negative.   Past Medical History:  Diagnosis Date  . Adenomatous colon polyp   . ALLERGIC RHINITIS   . Anemia    iron deficient  . Anxiety   . Atrial tachycardia (HCC)    Mostly Sinus Tachycardia  . Back pain, chronic   . Carotid  stenosis   . CHF (congestive heart failure) ([place])   . Community acquired pneumonia - Recent Admission (07/2013) 07/08/2013  . COPD (chronic obstructive pulmonary disease) ([place])    Requiring Home O2 at 4L  . Depression   . Diabetes mellitus type 2 with neurological manifestations ([place])    On Insulin  . Diverticulosis of colon   . External hemorrhoids   . Fibromyalgia    "pain in arms and shoulders."  . Gastritis   . GERD (gastroesophageal reflux disease)   . Headache(784.0)    tension  . Hyperlipemia   . Hypertension   . Inappropriate sinus tachycardia ([place])   . Morbidly obese ([place])   . Nephrolithiasis   . Neuromuscular disorder ([place])    diabetic neuropathy  . Osteoarthritis   . Osteoporosis   . PAF (paroxysmal atrial fibrillation) ([place])   . 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  . Sleep apnea    no cpap machine  02 at 4l/min all the time  . Urosepsis 05/26/2012    Past Surgical History:  Procedure Laterality Date  . ABDOMINAL HYSTERECTOMY  1978  . APPENDECTOMY  1963  . CARDIAC CATHETERIZATION N/A 07/04/2015   Procedure: Right Heart Cath;  Surgeon: Larey Dresser, MD;  Location: [facility];  Service: Cardiovascular;  Laterality: N/A;  . CHOLECYSTECTOMY  1963  . COLONOSCOPY N/A 12/21/2012   Procedure: COLONOSCOPY;  Surgeon: Gatha Mayer, MD;  Location: [facility];  Service: Endoscopy;  Laterality: N/A;  . COLONOSCOPY N/A 11/17/2013   Procedure: COLONOSCOPY;  Surgeon:  Gatha Mayer, MD;  Location: [facility];  Service: Endoscopy;  Laterality: N/A;  . COLONOSCOPY W/ BIOPSIES AND POLYPECTOMY  07/22/2007, 04/28/2007   2009: diverticulosis, 2008: diverticulosis, adenomatous polyps  . COLONOSCOPY WITH PROPOFOL N/A 10/01/2012   Procedure: COLONOSCOPY WITH PROPOFOL;  Surgeon: Gatha Mayer, MD;  Location: [facility];  Service: Endoscopy;  Laterality: N/A;  . COLONOSCOPY WITH PROPOFOL N/A 10/10/2014   Procedure: COLONOSCOPY WITH PROPOFOL;  Surgeon: Gatha Mayer, MD;  Location: [facility]  ENDOS[facility];  Service: Endoscopy;  Laterality: N/A;  . ESOPHAGOGASTRODUODENOSCOPY (EGD) WITH PROPOFOL N/A 10/01/2012   Procedure: ESOPHAGOGASTRODUODENOSCOPY (EGD) WITH PROPOFOL;  Surgeon: Gatha Mayer, MD;  Location: [facility];  Service: Endoscopy;  Laterality: N/A;  . HOT HEMOSTASIS N/A 12/21/2012   Procedure: HOT HEMOSTASIS (ARGON PLASMA COAGULATION/BICAP);  Surgeon: Gatha Mayer, MD;  Location: [facility];  Service: Endoscopy;  Laterality: N/A;  . HOT HEMOSTASIS N/A 10/10/2014   Procedure: HOT HEMOSTASIS (ARGON PLASMA COAGULATION/BICAP);  Surgeon: Gatha Mayer, MD;  Location: [facility];  Service: Endoscopy;  Laterality: N/A;  . LEFT AND RIGHT HEART CATHETERIZATION WITH CORONARY ANGIOGRAM  12/19/2011   Procedure: LEFT AND RIGHT HEART CATHETERIZATION WITH CORONARY ANGIOGRAM;  Surgeon: Leonie Man, MD;  Location: [facility] CATH LAB;  Service: Cardiovascular;;  . RIGHT AND LEFT CARDIAC CATHETERIZATION  July 2013   RAP: 22 mmHg, and RVP: 46/16 mmHg, RV EDP 18; L. BP 110/11 mmHg, LVEDP 20 mmHg;  PAP 45/29 mmHg (mean 37 mmHg); PCWP 29/22 mmHg - 26 mmHg; no significant coronary disease  . TEE WITHOUT CARDIOVERSION  03/24/2012   Procedure: TRANSESOPHAGEAL ECHOCARDIOGRAM (TEE);  Surgeon: Pixie Casino, MD;  Location: [facility];  Service: Cardiovascular;  Laterality: N/A;  TEE with Bubble  . TOTAL KNEE ARTHROPLASTY  1997   right  . UPPER GASTROINTESTINAL ENDOSCOPY  04/09/2006   w/Dilation, gastritis     reports that she quit smoking about 21 years ago. Her smoking use included Cigarettes. She has a 60.00 pack-year smoking history. She has never used smokeless tobacco. She reports that she does not drink alcohol or use drugs.  Allergies  Allergen Reactions  . Aspirin Shortness Of Breath  . Nsaids Shortness Of Breath    Family History  Problem Relation Age of Onset  . Heart disease Mother   . Stroke Mother   . Heart disease Father   . Heart disease Brother   . Stroke Brother   . Skin cancer  Brother   . Colon polyps Sister   . Diabetes Sister   . Diabetes Brother   . Irritable bowel syndrome Daughter   . Colon cancer Neg Hx     Prior to Admission medications   Medication Sig Start Date End Date Taking? Authorizing Provider  acetaminophen (TYLENOL) 500 MG tablet 500 mg every 6 (six) hours as needed for mild pain. Take per bottle as needed for pain   Yes Historical Provider, MD  ALPRAZolam Duanne Moron) 0.5 MG tablet Take 0.25 mg by mouth daily as needed for anxiety or sleep.    Yes Historical Provider, MD  atorvastatin (LIPITOR) 40 MG tablet Take 40 mg by mouth at bedtime.  06/06/13  Yes Historical Provider, MD  benzonatate (TESSALON) 200 MG capsule Take 200 mg by mouth at bedtime as needed for cough.    Yes Historical Provider, MD  buPROPion (WELLBUTRIN XL) 150 MG 24 hr tablet Take 150 mg by mouth every morning.    Yes Historical Provider, MD  DALIRESP 500 MCG TABS tablet  Take 500 mcg by mouth daily.  07/04/15  Yes Historical Provider, MD  DULoxetine (CYMBALTA) 60 MG capsule Take 60 mg by mouth every morning.    Yes Historical Provider, MD  furosemide (LASIX) 40 MG tablet Take 80 mg by mouth 2 (two) times daily. 07/13/13  Yes Marton Redwood, MD  gabapentin (NEURONTIN) 600 MG tablet Take 600 mg by mouth 3 (three) times daily.    Yes Historical Provider, MD  HYDROcodone-acetaminophen (NORCO/VICODIN) 5-325 MG per tablet Take 1 tablet by mouth every 4 (four) hours as needed for moderate pain. Reported on 12/12/2015   Yes Historical Provider, MD  insulin glargine (LANTUS) 100 UNIT/ML injection Inject 0.66 mLs (66 Units total) into the skin daily before breakfast. 01/24/16  Yes Janece Canterbury, MD  insulin lispro (HUMALOG) 100 UNIT/ML injection Inject 8 Units into the skin 2 (two) times daily.   Yes Historical Provider, MD  irbesartan (AVAPRO) 150 MG tablet Take 1 tablet (150 mg total) by mouth daily. KEEP OV. 01/11/16  Yes Leonie Man, MD  iron polysaccharides (NIFEREX) 150 MG capsule Take 1  capsule (150 mg total) by mouth 2 (two) times daily. 01/24/16  Yes Janece Canterbury, MD  levalbuterol [facility] HFA) 45 MCG/ACT inhaler Inhale 1-2 puffs into the lungs every 4 (four) hours as needed for wheezing. 06/01/12  Yes Marton Redwood, MD  Multiple Vitamin (MULTIVITAMIN) tablet Take 1 tablet by mouth every morning.    Yes Historical Provider, MD  omeprazole (PRILOSEC) 20 MG capsule Take 20 mg by mouth daily.    Yes Historical Provider, MD  ONE TOUCH ULTRA TEST test strip 1 each by Other route 3 (three) times daily.  07/10/14  Yes Historical Provider, MD  OXYGEN 4 L at baseline, Increase oxygen to 6L with acitivity   Yes Historical Provider, MD  polyethylene glycol (MIRALAX / GLYCOLAX) packet Take 17 g by mouth daily.   Yes Historical Provider, MD  potassium chloride SA (K-DUR,KLOR-CON) 10 MEQ tablet Take 3 tablets (30 mEq total) by mouth 2 (two) times daily. 11/19/15  Yes Larey Dresser, MD  rivaroxaban (XARELTO) 20 MG TABS tablet Take 1 tablet (20 mg total) by mouth daily with supper. Restart on Wednesday May 11 , 2016 10/10/14  Yes Gatha Mayer, MD  SYMBICORT 160-4.5 MCG/ACT inhaler INHALE 2 PUFFS BY MOUTH TWICE DAILY 11/12/15  Yes Juanito Doom, MD  tiotropium (SPIRIVA) 18 MCG inhalation capsule Place 1 capsule (18 mcg total) into inhaler and inhale every morning. 08/10/15  Yes Juanito Doom, MD  verapamil (VERELAN PM) 360 MG 24 hr capsule TAKE ONE CAPSULE BY MOUTH DAILY Patient taking differently: TAKE 360 MG BY MOUTH DAILY 12/17/15  Yes Leonie Man, MD  albuterol (PROVENTIL) (2.5 MG/3ML) 0.083% nebulizer solution Take 3 mLs (2.5 mg total) by nebulization every 6 (six) hours as needed for wheezing or shortness of breath. Patient not taking: Reported on 02/10/2016 06/28/14   Kathee Delton, MD  predniSONE (DELTASONE) 20 MG tablet Take 2 tabs daily for 2 days, 1 tab daily for 2 days, half tab daily for 2 days, then stop Patient not taking: Reported on 01/25/2016 01/24/16   Janece Canterbury,  MD    Physical Exam: Vitals:   02/10/16 1300 02/10/16 1338 02/10/16 1413 02/10/16 1423  BP: (!) 97/45 (!) 105/48 (!) 109/54   Pulse: (!) 122 (!) 122 (!) 124   Resp: 25 22 21    Temp:      TempSrc:      SpO2: Marland Kitchen)  89% 93% 91% 90%      Constitutional: Appears anxious, tachypneic Vitals:   02/10/16 1300 02/10/16 1338 02/10/16 1413 02/10/16 1423  BP: (!) 97/45 (!) 105/48 (!) 109/54   Pulse: (!) 122 (!) 122 (!) 124   Resp: 25 22 21    Temp:      TempSrc:      SpO2: (!) 89% 93% 91% 90%   Eyes: PERRL ENMT: Mucous membranes are moist. Posterior pharynx clear of any exudate or lesions. Neck: normal, supple, obese Respiratory:  overall distant breath sounds, wheezing bilaterally, no crackles  Cardiovascular: Regular rate and rhythm, no murmurs / rubs / gallops. No extremity edema. 2+ pedal pulses.  Tachycardic Abdomen: no tenderness, no masses palpated. Bowel sounds positive.  Musculoskeletal: no clubbing / cyanosis. Normal muscle tone.  Skin: no rashes, lesions, ulcers. No induration Neurologic: non focal  Psychiatric: Alert and oriented x 3. Anxious  Labs on Admission: I have personally reviewed following labs and imaging studies  CBC:  Recent Labs Lab 02/10/16 1030  WBC 12.6*  NEUTROABS 10.8*  HGB 11.6*  HCT 39.2  MCV 88.9  PLT [id number]   Basic Metabolic Panel:  Recent Labs Lab 02/10/16 1030  NA 141  K 3.8  CL 105  CO2 29  GLUCOSE 127*  BUN 14  CREATININE 0.92  CALCIUM 8.2*   GFR: Estimated Creatinine Clearance: 65.3 mL/min (by C-G formula based on SCr of 0.92 mg/dL). Liver Function Tests:  Recent Labs Lab 02/10/16 1030  AST 29  ALT 25  ALKPHOS 84  BILITOT 0.4  PROT 7.3  ALBUMIN 3.9    Recent Labs Lab 02/10/16 1030  LIPASE 22   No results for input(s): AMMONIA in the last 168 hours. Coagulation Profile: No results for input(s): INR, PROTIME in the last 168 hours. Cardiac Enzymes: No results for input(s): CKTOTAL, CKMB, CKMBINDEX, TROPONINI in  the last 168 hours. BNP (last 3 results) No results for input(s): PROBNP in the last 8760 hours. HbA1C: No results for input(s): HGBA1C in the last 72 hours. CBG:  Recent Labs Lab 02/10/16 1004 02/10/16 1051 02/10/16 1156  GLUCAP 56* 119* 149*   Lipid Profile: No results for input(s): CHOL, HDL, LDLCALC, TRIG, CHOLHDL, LDLDIRECT in the last 72 hours. Thyroid Function Tests: No results for input(s): TSH, T4TOTAL, FREET4, T3FREE, THYROIDAB in the last 72 hours. Anemia Panel: No results for input(s): VITAMINB12, FOLATE, FERRITIN, TIBC, IRON, RETICCTPCT in the last 72 hours. Urine analysis:    Component Value Date/Time   COLORURINE YELLOW 02/10/2016 1150   APPEARANCEUR CLEAR 02/10/2016 1150   LABSPEC 1.011 02/10/2016 1150   PHURINE 5.5 02/10/2016 1150   GLUCOSEU NEGATIVE 02/10/2016 1150   HGBUR NEGATIVE 02/10/2016 1150   BILIRUBINUR NEGATIVE 02/10/2016 1150   KETONESUR NEGATIVE 02/10/2016 1150   PROTEINUR NEGATIVE 02/10/2016 1150   UROBILINOGEN 0.2 07/09/2013 0058   NITRITE NEGATIVE 02/10/2016 1150   LEUKOCYTESUR NEGATIVE 02/10/2016 1150   Sepsis Labs: @LABRCNTIP (procalcitonin:4,lacticidven:4) ) Recent Results (from the past 240 hour(s))  Blood Culture (routine x 2)     Status: None (Preliminary result)   Collection Time: 02/10/16 10:45 AM  Result Value Ref Range Status   Specimen Description   Final    BLOOD BLOOD LEFT FOREARM Performed at [facility]    Special Requests NONE  Final   Culture PENDING  Incomplete   Report Status PENDING  Incomplete  Blood Culture (routine x 2)     Status: None (Preliminary result)   Collection Time: 02/10/16 10:55 AM  Result Value Ref Range Status   Specimen Description BLOOD BLOOD RIGHT HAND  Final   Special Requests   Final    BOTTLES DRAWN AEROBIC AND ANAEROBIC 5CC Performed at [facility]    Culture PENDING  Incomplete   Report Status PENDING  Incomplete     Radiological Exams on Admission: Dg Chest 1  View  Result Date: 02/10/2016 CLINICAL DATA:  Hypoglycemia with shortness of breath and weakness, decreased O2 sats history of COPD EXAM: CHEST 1 VIEW COMPARISON:  01/22/2016 FINDINGS: AP portable semi erect image of the chest. Mild diffuse interstitial prominence as before. Streaky subsegmental atelectasis versus scarring in the left lung base. No acute consolidation or effusion. Cardiomediastinal silhouette upper limits of normal with atherosclerosis of the aorta. No pneumothorax. IMPRESSION: 1. No acute consolidation or effusion 2. Diffuse interstitial prominence with subsegmental atelectasis or scarring in the left base 3. Atherosclerotic vascular calcifications of the aorta Electronically Signed   By: Donavan Foil M.D.   On: 02/10/2016 11:49    EKG: Independently reviewed. Sinus rhythm   Assessment/Plan Active Problems:   Diabetes mellitus type 2 with neurological manifestations (HCC)   Anxiety state   Depression   Essential hypertension   Backache   COPD GOLD II    Inappropriate sinus tachycardia (HCC)   Pulmonary hypertension (HCC)   PAF (paroxysmal atrial fibrillation) (HCC)   Chronic respiratory failure (HCC)   Morbid obesity (HCC)   Chronic diastolic CHF (congestive heart failure) (HCC)   COPD exacerbation (HCC)    COPD exacerbation with acute on chronic hypoxic respiratory failure  - Patient is currently maintaining sats on 6 L nasal cannula, she is about to get another breathing treatment, however she is not tachypnea and I will admit her to stepdown for close monitoring. She may need BiPAP. She is a full code, and low threshold to consult the CCM if her breathing deteriorates  - duonebs, Pulmicort, IV steroids, levofloxacin   Type 2 diabetes mellitus with neuropathy - Resume Lantus at lower dose given hypoglycemia at home - SSI  Paroxysmal atrial fibrillation, currently in sinus rhythm - Continue Xarelto for anticoagulation  Chronic diastolic CHF - No evidence of  fluid overload, BNP normal - continue home Lasix  HTN - resume home medications  Depression / anxiety - continue Xanax, Wellbutrin  HLD - continue statin   DVT prophylaxis: Xarelto  Code Status: Full  Family Communication: husband and daughter bedside Disposition Plan: admit to SDU Consults called: none  Admission status: inpatient    Marzetta Board, MD [facility] Pager [phone]  If 7PM-7AM, please contact night-coverage [email or web address] Password [email or web address]  02/10/2016, 2:29 PM

## 2016-02-10 NOTE — ED Notes (Addendum)
RN and EDP made aware of critical CBG

## 2016-02-10 NOTE — Progress Notes (Signed)
Patient's BP gradually trending down.  BP at 97/30 (51) at 2000. [facility] NP paged. Verbal orders for 500cc bolus NS over 4 hours received from K. Schorr. BP currently 102/27 (52).  Patient A&Ox4. Will continue to monitor.

## 2016-02-10 NOTE — ED Notes (Signed)
Bed: WA17 Expected date:  Expected time:  Means of arrival:  Comments: 70 yo gen weakness

## 2016-02-11 LAB — URINE CULTURE: CULTURE: NO GROWTH

## 2016-02-11 LAB — COMPREHENSIVE METABOLIC PANEL
ALBUMIN: 3.6 g/dL (ref 3.5–5.0)
ALT: 20 U/L (ref 14–54)
ANION GAP: 7 (ref 5–15)
AST: 20 U/L (ref 15–41)
Alkaline Phosphatase: 72 U/L (ref 38–126)
BUN: 12 mg/dL (ref 6–20)
CHLORIDE: 107 mmol/L (ref 101–111)
CO2: 26 mmol/L (ref 22–32)
Calcium: 7.5 mg/dL — ABNORMAL LOW (ref 8.9–10.3)
Creatinine, Ser: 0.67 mg/dL (ref 0.44–1.00)
GFR calc Af Amer: >60 mL/min (ref >60)
GFR calc non Af Amer: >60 mL/min (ref >60)
GLUCOSE: 165 mg/dL — AB (ref 65–99)
POTASSIUM: 4.3 mmol/L (ref 3.5–5.1)
SODIUM: 140 mmol/L (ref 135–145)
Total Bilirubin: 0.6 mg/dL (ref 0.3–1.2)
Total Protein: 6.6 g/dL (ref 6.5–8.1)

## 2016-02-11 LAB — CBC
HEMATOCRIT: 36.4 % (ref 36.0–46.0)
HEMOGLOBIN: 11 g/dL — AB (ref 12.0–15.0)
MCH: 26.3 pg (ref 26.0–34.0)
MCHC: 30.2 g/dL (ref 30.0–36.0)
MCV: 87.1 fL (ref 78.0–100.0)
PLATELETS: 189 10*3/uL (ref 150–400)
RBC: 4.18 MIL/uL (ref 3.87–5.11)
RDW: 21 % — AB (ref 11.5–15.5)
WBC: 8.5 10*3/uL (ref 4.0–10.5)

## 2016-02-11 LAB — GLUCOSE, CAPILLARY
GLUCOSE-CAPILLARY: 143 mg/dL — AB (ref 65–99)
GLUCOSE-CAPILLARY: 160 mg/dL — AB (ref 65–99)
Glucose-Capillary: 172 mg/dL — ABNORMAL HIGH (ref 65–99)
Glucose-Capillary: 164 mg/dL — ABNORMAL HIGH (ref 65–99)

## 2016-02-11 MED ORDER — SODIUM CHLORIDE 0.9 % IV BOLUS (SEPSIS)
500.0000 mL | Freq: Once | INTRAVENOUS | Status: AC
  Administered 2016-02-11: 500 mL via INTRAVENOUS

## 2016-02-11 MED ORDER — ALBUTEROL SULFATE (2.5 MG/3ML) 0.083% IN NEBU
2.5000 mg | INHALATION_SOLUTION | RESPIRATORY_TRACT | Status: DC | PRN
  Administered 2016-02-14: 2.5 mg via RESPIRATORY_TRACT
  Filled 2016-02-11: qty 3

## 2016-02-11 MED ORDER — CEFUROXIME AXETIL 500 MG PO TABS
500.0000 mg | ORAL_TABLET | Freq: Two times a day (BID) | ORAL | Status: DC
  Administered 2016-02-11 – 2016-02-15 (×8): 500 mg via ORAL
  Filled 2016-02-11 (×8): qty 1

## 2016-02-11 MED ORDER — IPRATROPIUM-ALBUTEROL 0.5-2.5 (3) MG/3ML IN SOLN
3.0000 mL | Freq: Three times a day (TID) | RESPIRATORY_TRACT | Status: DC
  Administered 2016-02-11 – 2016-02-14 (×8): 3 mL via RESPIRATORY_TRACT
  Filled 2016-02-11 (×8): qty 3

## 2016-02-11 NOTE — Care Management Note (Signed)
Case Management Note  Patient Details  Name: Destiny Shelton MRN: [id number] Date of Birth: [birth year]  Subjective/Objective:     hypovoilemia versus early sepsis               Action/Plan:   Expected Discharge Date:  02/14/16               Expected Discharge Plan:  Home/Self Care  In-House Referral:  NA  Discharge planning Services  NA  Post Acute Care Choice:  NA Choice offered to:     DME Arranged:  N/A DME Agency:  NA  HH Arranged:  NA HH Agency:  NA  Status of Service:  In process, will continue to follow  If discussed at Long Length of Stay Meetings, dates discussed:    Additional Comments:Date:  February 11, 2016 Chart reviewed for concurrent status and case management needs. Will continue to follow the patient for status change: Discharge Planning: following for needs Expected discharge date: [id number] Velva Harman, BSN, [place], [place]  Leeroy Cha, RN 02/11/2016, 10:01 AM

## 2016-02-11 NOTE — Progress Notes (Signed)
Patient's BP remaining 90s/30s after 500cc bolus. [facility] NP K. Schorr paged.  Additionally 500cc bolus NS ordered.  Patient remains A&Ox4, voiding, lungs diminished but clear bilaterally. Will continue to monitor.

## 2016-02-11 NOTE — Progress Notes (Signed)
PROGRESS NOTE    Destiny Shelton  [id number] DOB: Jan 02, 1946 DOA: 02/10/2016 PCP: Marton Redwood, MD   Outpatient Specialists:    Brief Narrative:  Destiny Shelton is a 70 y.o. female with medical history significant of COPD with chronic hypoxic respiratory failure on 4 L nasal baseline, hypertension, diabetes mellitus, paroxysmal atrial fibrillation on chronic Xarelto, presents to the emergency room with a chief complaint of shortness of breath. Patient has been feeling more short of breath over the last 3-4 days, worse on the last 2 days, she has noticed increased cough which is without and with green sputum, and has had several coughing spells that she was barely able to get her breath. She denies any blood sputum. Her husband is at bedside, tells me that this morning he got very concerned as patient has had so much difficulty breathing that she became somnolent and she appeared confused. He decided to call EMS. Patient denies any chest pain, she has no fever or chills. She denies palpitations. She has no abdominal pain, nausea, vomiting or diarrhea. She noticed this morning that her sugar was low after giving herself her insulin. She did not eat much this morning    Assessment & Plan:   Active Problems:   Diabetes mellitus type 2 with neurological manifestations (HCC)   Anxiety state   Depression   Essential hypertension   Backache   COPD GOLD II    Inappropriate sinus tachycardia (HCC)   Pulmonary hypertension (HCC)   PAF (paroxysmal atrial fibrillation) (HCC)   Chronic respiratory failure (HCC)   Morbid obesity (HCC)   Chronic diastolic CHF (congestive heart failure) (HCC)   COPD exacerbation (HCC)   COPD exacerbation with acute on chronic hypoxic respiratory failure  - Patient is currently maintaining sats on 5 L nasal cannula -low threshold to consult the CCM if her breathing deteriorates  - duonebs, Pulmicort, IV steroids, levofloxacin   Type 2 diabetes mellitus with  neuropathy - Resume Lantus at lower dose given hypoglycemia at home - SSI  Paroxysmal atrial fibrillation, currently in sinus rhythm - Continue Xarelto for anticoagulation  Chronic diastolic CHF - No evidence of fluid overload, BNP normal - resume home Lasix tomm  HTN - holding parameters for hypotension  Depression / anxiety - continue Xanax, Wellbutrin  HLD - continue statin   DVT prophylaxis:  Fully anticoagulated   Code Status: Full Code   Family Communication: progress  Disposition Plan:  Remain in SDU   Consultants:     Procedures:        Subjective: Still with some SOB  Objective: Vitals:   02/11/16 0732 02/11/16 0800 02/11/16 0900 02/11/16 0953  BP:  (!) 111/35 (!) 133/116 (!) 104/35  Pulse:  (!) 108 (!) 115   Resp:  (!) 25 (!) 28   Temp: 98.3 F (36.8 C)     TempSrc: Oral     SpO2:  (!) 89% (!) 87%   Weight:      Height:        Intake/Output Summary (Last 24 hours) at 02/11/16 1010 Last data filed at 02/11/16 0400  Gross per 24 hour  Intake             1140 ml  Output             1800 ml  Net             -660 ml   Filed Weights   02/10/16 1646  Weight: 100.7 kg (222 lb  0.1 oz)    Examination:  General exam: Appears calm and comfortable  Respiratory system: diminished, no wheezing Cardiovascular system: tachy. No pedal edema. Gastrointestinal system: Abdomen is nondistended, soft and nontender. No organomegaly or masses felt. Normal bowel sounds heard. Central nervous system: Alert and oriented. No focal neurological deficits. Extremities: Symmetric 5 x 5 power. Skin: No rashes, lesions or ulcers Psychiatry: Judgement and insight appear normal. Mood & affect appropriate.     Data Reviewed: I have personally reviewed following labs and imaging studies  CBC:  Recent Labs Lab 02/10/16 1030 02/11/16 0304  WBC 12.6* 8.5  NEUTROABS 10.8*  --   HGB 11.6* 11.0*  HCT 39.2 36.4  MCV 88.9 87.1  PLT 207 [id number]    Basic Metabolic Panel:  Recent Labs Lab 02/10/16 1030 02/11/16 0304  NA 141 140  K 3.8 4.3  CL 105 107  CO2 29 26  GLUCOSE 127* 165*  BUN 14 12  CREATININE 0.92 0.67  CALCIUM 8.2* 7.5*   GFR: Estimated Creatinine Clearance: 75.9 mL/min (by C-G formula based on SCr of 0.8 mg/dL). Liver Function Tests:  Recent Labs Lab 02/10/16 1030 02/11/16 0304  AST 29 20  ALT 25 20  ALKPHOS 84 72  BILITOT 0.4 0.6  PROT 7.3 6.6  ALBUMIN 3.9 3.6    Recent Labs Lab 02/10/16 1030  LIPASE 22   No results for input(s): AMMONIA in the last 168 hours. Coagulation Profile: No results for input(s): INR, PROTIME in the last 168 hours. Cardiac Enzymes: No results for input(s): CKTOTAL, CKMB, CKMBINDEX, TROPONINI in the last 168 hours. BNP (last 3 results) No results for input(s): PROBNP in the last 8760 hours. HbA1C: No results for input(s): HGBA1C in the last 72 hours. CBG:  Recent Labs Lab 02/10/16 1051 02/10/16 1156 02/10/16 1657 02/10/16 2145 02/11/16 0712  GLUCAP 119* 149* 280* 147* 172*   Lipid Profile: No results for input(s): CHOL, HDL, LDLCALC, TRIG, CHOLHDL, LDLDIRECT in the last 72 hours. Thyroid Function Tests: No results for input(s): TSH, T4TOTAL, FREET4, T3FREE, THYROIDAB in the last 72 hours. Anemia Panel: No results for input(s): VITAMINB12, FOLATE, FERRITIN, TIBC, IRON, RETICCTPCT in the last 72 hours. Urine analysis:    Component Value Date/Time   COLORURINE YELLOW 02/10/2016 1150   APPEARANCEUR CLEAR 02/10/2016 1150   LABSPEC 1.011 02/10/2016 1150   PHURINE 5.5 02/10/2016 1150   GLUCOSEU NEGATIVE 02/10/2016 1150   HGBUR NEGATIVE 02/10/2016 1150   BILIRUBINUR NEGATIVE 02/10/2016 1150   KETONESUR NEGATIVE 02/10/2016 1150   PROTEINUR NEGATIVE 02/10/2016 1150   UROBILINOGEN 0.2 07/09/2013 0058   NITRITE NEGATIVE 02/10/2016 1150   LEUKOCYTESUR NEGATIVE 02/10/2016 1150     ) Recent Results (from the past 240 hour(s))  Blood Culture (routine x 2)      Status: None (Preliminary result)   Collection Time: 02/10/16 10:45 AM  Result Value Ref Range Status   Specimen Description   Final    BLOOD BLOOD LEFT FOREARM Performed at [facility]    Special Requests NONE  Final   Culture PENDING  Incomplete   Report Status PENDING  Incomplete  Blood Culture (routine x 2)     Status: None (Preliminary result)   Collection Time: 02/10/16 10:55 AM  Result Value Ref Range Status   Specimen Description BLOOD BLOOD RIGHT HAND  Final   Special Requests   Final    BOTTLES DRAWN AEROBIC AND ANAEROBIC 5CC Performed at [facility]    Culture PENDING  Incomplete  Report Status PENDING  Incomplete  MRSA PCR Screening     Status: None   Collection Time: 02/10/16  4:45 PM  Result Value Ref Range Status   MRSA by PCR NEGATIVE NEGATIVE Final    Comment:        The GeneXpert MRSA Assay (FDA approved for NASAL specimens only), is one component of a comprehensive MRSA colonization surveillance program. It is not intended to diagnose MRSA infection nor to guide or monitor treatment for MRSA infections.       Anti-infectives    Start     Dose/Rate Route Frequency Ordered Stop   02/11/16 1200  levofloxacin (LEVAQUIN) IVPB 500 mg     500 mg 100 mL/hr over 60 Minutes Intravenous Every 24 hours 02/10/16 1426     02/10/16 1215  azithromycin (ZITHROMAX) 500 mg in dextrose 5 % 250 mL IVPB     500 mg 250 mL/hr over 60 Minutes Intravenous  Once 02/10/16 1210 02/10/16 1413       Radiology Studies: Dg Chest 1 View  Result Date: 02/10/2016 CLINICAL DATA:  Hypoglycemia with shortness of breath and weakness, decreased O2 sats history of COPD EXAM: CHEST 1 VIEW COMPARISON:  01/22/2016 FINDINGS: AP portable semi erect image of the chest. Mild diffuse interstitial prominence as before. Streaky subsegmental atelectasis versus scarring in the left lung base. No acute consolidation or effusion. Cardiomediastinal silhouette upper limits of  normal with atherosclerosis of the aorta. No pneumothorax. IMPRESSION: 1. No acute consolidation or effusion 2. Diffuse interstitial prominence with subsegmental atelectasis or scarring in the left base 3. Atherosclerotic vascular calcifications of the aorta Electronically Signed   By: Donavan Foil M.D.   On: 02/10/2016 11:49        Scheduled Meds: . atorvastatin  40 mg Oral QHS  . budesonide (PULMICORT) nebulizer solution  0.25 mg Nebulization BID  . buPROPion  150 mg Oral Daily  . dextromethorphan-guaiFENesin  1 tablet Oral BID  . DULoxetine  60 mg Oral q morning - 10a  . furosemide  80 mg Oral BID  . gabapentin  600 mg Oral TID  . insulin aspart  0-5 Units Subcutaneous QHS  . insulin aspart  0-9 Units Subcutaneous TID WC  . insulin glargine  30 Units Subcutaneous Daily  . ipratropium-albuterol  3 mL Nebulization Q6H  . irbesartan  150 mg Oral Daily  . levofloxacin (LEVAQUIN) IV  500 mg Intravenous Q24H  . mouth rinse  15 mL Mouth Rinse BID  . methylPREDNISolone (SOLU-MEDROL) injection  60 mg Intravenous Q12H  . polyethylene glycol  17 g Oral Daily  . rivaroxaban  20 mg Oral Q supper  . roflumilast  500 mcg Oral Daily  . sodium chloride flush  3 mL Intravenous Q12H  . verapamil  360 mg Oral Daily   Continuous Infusions:    LOS: 1 day    Time spent: 35 min    [place], DO [facility] Pager [phone]  If 7PM-7AM, please contact night-coverage [email or web address] Password [email or web address] 02/11/2016, 10:10 AM

## 2016-02-12 ENCOUNTER — Ambulatory Visit: Payer: Medicare Other | Admitting: Pulmonary Disease

## 2016-02-12 LAB — GLUCOSE, CAPILLARY
GLUCOSE-CAPILLARY: 167 mg/dL — AB (ref 65–99)
Glucose-Capillary: 217 mg/dL — ABNORMAL HIGH (ref 65–99)
Glucose-Capillary: 155 mg/dL — ABNORMAL HIGH (ref 65–99)
Glucose-Capillary: 242 mg/dL — ABNORMAL HIGH (ref 65–99)

## 2016-02-12 MED ORDER — METHYLPREDNISOLONE SODIUM SUCC 125 MG IJ SOLR
60.0000 mg | Freq: Four times a day (QID) | INTRAMUSCULAR | Status: DC
  Administered 2016-02-12 – 2016-02-14 (×9): 60 mg via INTRAVENOUS
  Filled 2016-02-12 (×9): qty 2

## 2016-02-12 NOTE — Evaluation (Signed)
Physical Therapy Evaluation Patient Details Name: Destiny Shelton MRN: [id number] DOB: 06/22/1945 Today's Date: 02/12/2016   History of Present Illness  70 y.o. female with medical history significant of COPD on home oxygen usually at 4 LPM, type 2 diabetes, anemia, anxiety, paroxysmal atrial fibrillation, carotid stenosis, depression, diverticulosis, fibromyalgia, GERD, gastritis, hyperlipidemia, hypertension, osteoarthritis, osteoporosis, sleep apnea who came to the emergency department 9/10 due to SOB.Marland Kitchen Recent fall with L rib factures.  Clinical Impression  The patient is quite limited  With desaturation on 5 liters [place] and DOE. She reports that she does not desire HHPT. Spouse present. Pt admitted with above diagnosis. Pt currently with functional limitations due to the deficits listed below (see PT Problem List). Pt will benefit from skilled PT to increase their independence and safety with mobility to allow discharge to the venue listed below.       Follow Up Recommendations Home health PT;Supervision/Assistance - 24 hour (patient reports that she does not desire HHPT)    Equipment Recommendations  None recommended by PT    Recommendations for Other Services       Precautions / Restrictions Precautions Precautions: Fall Precaution Comments: on 5 liters Hi flow oxygen      Mobility  Bed Mobility Overal bed mobility: Needs Assistance Bed Mobility: Supine to Sit;Sit to Supine     Supine to sit: Supervision Sit to supine: Min assist   General bed mobility comments: min assist for B LE up onto bed then Max Assist + 2 to scoot to [facility]  Transfers Overall transfer level: Needs assistance Equipment used: 1 person hand held assist Transfers: Sit to/from Stand Sit to Stand: Min assist         General transfer comment: Steady assist to stand up from bed and BSC with HHA.  Ambulation/Gait Ambulation/Gait assistance: Min assist Ambulation Distance (Feet): 6 Feet (x  2) Assistive device: 1 person hand held assist Gait Pattern/deviations: Step-through pattern;Decreased stride length     General Gait Details: limited ambulation on 5 liters, sats noted to drop to 81 after sitting down for break between walks. Required 4 minutes to recover to >90% before ambulating again. , HR 117, RR 30  Stairs            Wheelchair Mobility    Modified Rankin (Stroke Patients Only)       Balance Overall balance assessment: History of Falls;Needs assistance Sitting-balance support: No upper extremity supported;Feet supported Sitting balance-Leahy Scale: Fair     Standing balance support: During functional activity;Single extremity supported Standing balance-Leahy Scale: Poor                               Pertinent Vitals/Pain Faces Pain Scale: Hurts a little bit Pain Location:  L side    Home Living Family/patient expects to be discharged to:: Private residence Living Arrangements: Spouse/significant other Available Help at Discharge: Family;Available 24 hours/day Type of Home: House Home Access: Level entry     Home Layout: One level Home Equipment: Walker - 4 wheels;Cane - single point;Shower seat;Wheelchair - Education administrator (comment) Additional Comments: home O2    Prior Function Level of Independence: Independent with assistive device(s)               Hand Dominance        Extremity/Trunk Assessment   Upper Extremity Assessment: Generalized weakness           Lower Extremity Assessment: Generalized weakness  Cervical / Trunk Assessment: Kyphotic  Communication   Communication: No difficulties  Cognition Arousal/Alertness: Awake/alert Behavior During Therapy: WFL for tasks assessed/performed Overall Cognitive Status: Within Functional Limits for tasks assessed                      General Comments      Exercises        Assessment/Plan    PT Assessment Patient needs continued PT  services  PT Diagnosis Difficulty walking;Generalized weakness   PT Problem List Decreased strength;Decreased activity tolerance;Decreased balance;Cardiopulmonary status limiting activity;Decreased mobility  PT Treatment Interventions Gait training;Functional mobility training;Therapeutic activities;Therapeutic exercise;Balance training;Patient/family education   PT Goals (Current goals can be found in the Care Plan section) Acute Rehab PT Goals Patient Stated Goal: home PT Goal Formulation: With patient/family Time For Goal Achievement: 02/26/16 Potential to Achieve Goals: Fair    Frequency Min 3X/week   Barriers to discharge        Co-evaluation               End of Session   Activity Tolerance: Treatment limited secondary to medical complications (Comment);Patient limited by fatigue (decreased sats and SOB) Patient left: in bed;with call bell/phone within reach;with bed alarm set;with family/visitor present Nurse Communication: Mobility status         Time: 1345-1405 PT Time Calculation (min) (ACUTE ONLY): 20 min   Charges:   PT Evaluation $PT Eval Low Complexity: 1 Procedure     PT G CodesClaretha Cooper 02/12/2016, 7:10 PM Tresa Endo PT [phone]

## 2016-02-12 NOTE — Consult Note (Signed)
   [facility] Inpatient Consult   02/12/2016  Destiny Shelton [facility] 1945-09-11 [id number]    Patient screened for [place] [facility] services for COPD and recent hospital readmission. Chart reviewed. Noted she declined [place] [facility] program during last hospitalization. She is currently in stepdown unit at [facility]. Will attempt to speak with Destiny Shelton at a more appropriate time about engaging with [place] [facility] program. Spoke with inpatient RNCM to make aware of the above.   Marthenia Rolling, MSN-Ed, RN,BSN [facility] Liaison [phone]

## 2016-02-12 NOTE — Progress Notes (Signed)
PT Cancellation Note  Patient Details Name: ELVERIA BRAKEFIELD MRN: [id number] DOB: 05/27/1946   Cancelled Treatment:    Reason Eval/Treat Not Completed: Other (comment) (patient was eating. will check back later.)   Claretha Cooper 02/12/2016, 11:37 AM

## 2016-02-12 NOTE — Progress Notes (Addendum)
PROGRESS NOTE    Destiny Shelton  [id number] DOB: 06/25/1945 DOA: 02/10/2016 PCP: Marton Redwood, MD   Outpatient Specialists:    Brief Narrative:  Destiny Shelton is a 70 y.o. female with medical history significant of COPD with chronic hypoxic respiratory failure on 4 L nasal baseline, up to 6 when ambulating, hypertension, diabetes mellitus, paroxysmal atrial fibrillation on chronic Xarelto, presents to the emergency room with a chief complaint of shortness of breath. Patient has been feeling more short of breath over the last 3-4 days, worse on the last 2 days, she has noticed increased cough which is without and with green sputum, and has had several coughing spells that she was barely able to get her breath.  Slow to improve, has remained in SDU as with any exertion, she de-sats into the 70s-80s.   Assessment & Plan:   Active Problems:   Diabetes mellitus type 2 with neurological manifestations (HCC)   Anxiety state   Depression   Essential hypertension   Backache   COPD GOLD II    Inappropriate sinus tachycardia (HCC)   Pulmonary hypertension (HCC)   PAF (paroxysmal atrial fibrillation) (HCC)   Chronic respiratory failure (HCC)   Morbid obesity (HCC)   Chronic diastolic CHF (congestive heart failure) (HCC)   COPD exacerbation (HCC)   COPD exacerbation with acute on chronic hypoxic respiratory failure  - Patient is currently maintaining sats on 4 L nasal cannula unitl she moves in bed when her O2 sats drop to the 70s-80s -increase IV steroids to more frequently - duonebs, Pulmicort -abx   Type 2 diabetes mellitus with neuropathy - Resume Lantus at lower dose given hypoglycemia at home - SSI  Paroxysmal atrial fibrillation, currently in sinus rhythm - Continue Xarelto for anticoagulation  Chronic diastolic CHF - No evidence of fluid overload, BNP normal - resume home Lasix   HTN - holding parameters for hypotension  Depression / anxiety - continue  Xanax, Wellbutrin  HLD - continue statin   DVT prophylaxis:  Fully anticoagulated   Code Status: Full Code   Family Communication: Husband at bedside 9/11 during PM rounds  Disposition Plan:  Remain in SDU for de-satting with movement   Consultants:     Procedures:        Subjective: Gets short of breath with any movement No CP  Objective: Vitals:   02/12/16 0333 02/12/16 0604 02/12/16 0700 02/12/16 0800  BP:  (!) 119/48    Pulse:  (!) 101 100 (!) 103  Resp:  16 19 (!) 21  Temp: 98.5 F (36.9 C)   97.4 F (36.3 C)  TempSrc: Oral   Oral  SpO2:  93% 90% 91%  Weight:      Height:        Intake/Output Summary (Last 24 hours) at 02/12/16 0958 Last data filed at 02/12/16 0800  Gross per 24 hour  Intake             1240 ml  Output             3250 ml  Net            -2010 ml   Filed Weights   02/10/16 1646  Weight: 100.7 kg (222 lb 0.1 oz)    Examination:  General exam: Appears calm and comfortable  Respiratory system: diminished, exp wheezing Cardiovascular system: tachy. No pedal edema. Gastrointestinal system: Abdomen is nondistended, soft and nontender. No organomegaly or masses felt. Normal bowel sounds heard. Central nervous system:  Alert and oriented. No focal neurological deficits.     Data Reviewed: I have personally reviewed following labs and imaging studies  CBC:  Recent Labs Lab 02/10/16 1030 02/11/16 0304  WBC 12.6* 8.5  NEUTROABS 10.8*  --   HGB 11.6* 11.0*  HCT 39.2 36.4  MCV 88.9 87.1  PLT 207 [id number]   Basic Metabolic Panel:  Recent Labs Lab 02/10/16 1030 02/11/16 0304  NA 141 140  K 3.8 4.3  CL 105 107  CO2 29 26  GLUCOSE 127* 165*  BUN 14 12  CREATININE 0.92 0.67  CALCIUM 8.2* 7.5*   GFR: Estimated Creatinine Clearance: 75.9 mL/min (by C-G formula based on SCr of 0.8 mg/dL). Liver Function Tests:  Recent Labs Lab 02/10/16 1030 02/11/16 0304  AST 29 20  ALT 25 20  ALKPHOS 84 72  BILITOT 0.4 0.6   PROT 7.3 6.6  ALBUMIN 3.9 3.6    Recent Labs Lab 02/10/16 1030  LIPASE 22   No results for input(s): AMMONIA in the last 168 hours. Coagulation Profile: No results for input(s): INR, PROTIME in the last 168 hours. Cardiac Enzymes: No results for input(s): CKTOTAL, CKMB, CKMBINDEX, TROPONINI in the last 168 hours. BNP (last 3 results) No results for input(s): PROBNP in the last 8760 hours. HbA1C: No results for input(s): HGBA1C in the last 72 hours. CBG:  Recent Labs Lab 02/11/16 0712 02/11/16 1136 02/11/16 1659 02/11/16 2120 02/12/16 0808  GLUCAP 172* 164* 143* 160* 167*   Lipid Profile: No results for input(s): CHOL, HDL, LDLCALC, TRIG, CHOLHDL, LDLDIRECT in the last 72 hours. Thyroid Function Tests: No results for input(s): TSH, T4TOTAL, FREET4, T3FREE, THYROIDAB in the last 72 hours. Anemia Panel: No results for input(s): VITAMINB12, FOLATE, FERRITIN, TIBC, IRON, RETICCTPCT in the last 72 hours. Urine analysis:    Component Value Date/Time   COLORURINE YELLOW 02/10/2016 1150   APPEARANCEUR CLEAR 02/10/2016 1150   LABSPEC 1.011 02/10/2016 1150   PHURINE 5.5 02/10/2016 1150   GLUCOSEU NEGATIVE 02/10/2016 1150   HGBUR NEGATIVE 02/10/2016 1150   BILIRUBINUR NEGATIVE 02/10/2016 1150   KETONESUR NEGATIVE 02/10/2016 1150   PROTEINUR NEGATIVE 02/10/2016 1150   UROBILINOGEN 0.2 07/09/2013 0058   NITRITE NEGATIVE 02/10/2016 1150   LEUKOCYTESUR NEGATIVE 02/10/2016 1150     ) Recent Results (from the past 240 hour(s))  Blood Culture (routine x 2)     Status: None (Preliminary result)   Collection Time: 02/10/16 10:45 AM  Result Value Ref Range Status   Specimen Description BLOOD BLOOD LEFT FOREARM  Final   Special Requests NONE  Final   Culture   Final    NO GROWTH 1 DAY Performed at [facility]    Report Status PENDING  Incomplete  Blood Culture (routine x 2)     Status: None (Preliminary result)   Collection Time: 02/10/16 10:55 AM  Result Value  Ref Range Status   Specimen Description BLOOD BLOOD RIGHT HAND  Final   Special Requests BOTTLES DRAWN AEROBIC AND ANAEROBIC 5CC  Final   Culture   Final    NO GROWTH 1 DAY Performed at [facility]    Report Status PENDING  Incomplete  Urine culture     Status: None   Collection Time: 02/10/16 11:50 AM  Result Value Ref Range Status   Specimen Description URINE, CATHETERIZED  Final   Special Requests NONE  Final   Culture NO GROWTH Performed at [facility]   Final   Report Status 02/11/2016  FINAL  Final  MRSA PCR Screening     Status: None   Collection Time: 02/10/16  4:45 PM  Result Value Ref Range Status   MRSA by PCR NEGATIVE NEGATIVE Final    Comment:        The GeneXpert MRSA Assay (FDA approved for NASAL specimens only), is one component of a comprehensive MRSA colonization surveillance program. It is not intended to diagnose MRSA infection nor to guide or monitor treatment for MRSA infections.       Anti-infectives    Start     Dose/Rate Route Frequency Ordered Stop   02/11/16 1700  cefUROXime (CEFTIN) tablet 500 mg     500 mg Oral 2 times daily with meals 02/11/16 1441     02/11/16 1200  levofloxacin (LEVAQUIN) IVPB 500 mg  Status:  Discontinued     500 mg 100 mL/hr over 60 Minutes Intravenous Every 24 hours 02/10/16 1426 02/11/16 1441   02/10/16 1215  azithromycin (ZITHROMAX) 500 mg in dextrose 5 % 250 mL IVPB     500 mg 250 mL/hr over 60 Minutes Intravenous  Once 02/10/16 1210 02/10/16 1413       Radiology Studies: Dg Chest 1 View  Result Date: 02/10/2016 CLINICAL DATA:  Hypoglycemia with shortness of breath and weakness, decreased O2 sats history of COPD EXAM: CHEST 1 VIEW COMPARISON:  01/22/2016 FINDINGS: AP portable semi erect image of the chest. Mild diffuse interstitial prominence as before. Streaky subsegmental atelectasis versus scarring in the left lung base. No acute consolidation or effusion. Cardiomediastinal silhouette upper  limits of normal with atherosclerosis of the aorta. No pneumothorax. IMPRESSION: 1. No acute consolidation or effusion 2. Diffuse interstitial prominence with subsegmental atelectasis or scarring in the left base 3. Atherosclerotic vascular calcifications of the aorta Electronically Signed   By: Donavan Foil M.D.   On: 02/10/2016 11:49        Scheduled Meds: . atorvastatin  40 mg Oral QHS  . budesonide (PULMICORT) nebulizer solution  0.25 mg Nebulization BID  . buPROPion  150 mg Oral Daily  . cefUROXime  500 mg Oral BID WC  . dextromethorphan-guaiFENesin  1 tablet Oral BID  . DULoxetine  60 mg Oral q morning - 10a  . furosemide  80 mg Oral BID  . gabapentin  600 mg Oral TID  . insulin aspart  0-5 Units Subcutaneous QHS  . insulin aspart  0-9 Units Subcutaneous TID WC  . insulin glargine  30 Units Subcutaneous Daily  . ipratropium-albuterol  3 mL Nebulization TID  . irbesartan  150 mg Oral Daily  . mouth rinse  15 mL Mouth Rinse BID  . methylPREDNISolone (SOLU-MEDROL) injection  60 mg Intravenous Q6H  . polyethylene glycol  17 g Oral Daily  . rivaroxaban  20 mg Oral Q supper  . roflumilast  500 mcg Oral Daily  . sodium chloride flush  3 mL Intravenous Q12H  . verapamil  360 mg Oral Daily   Continuous Infusions:    LOS: 2 days    Time spent: 35 min    [place], DO [facility] Pager [phone]  If 7PM-7AM, please contact night-coverage [email or web address] Password [facility] 02/12/2016, 9:58 AM

## 2016-02-13 DIAGNOSIS — I10 Essential (primary) hypertension: Secondary | ICD-10-CM

## 2016-02-13 DIAGNOSIS — I480 Paroxysmal atrial fibrillation: Secondary | ICD-10-CM

## 2016-02-13 DIAGNOSIS — J9611 Chronic respiratory failure with hypoxia: Secondary | ICD-10-CM

## 2016-02-13 DIAGNOSIS — J449 Chronic obstructive pulmonary disease, unspecified: Secondary | ICD-10-CM

## 2016-02-13 DIAGNOSIS — E1149 Type 2 diabetes mellitus with other diabetic neurological complication: Secondary | ICD-10-CM

## 2016-02-13 DIAGNOSIS — I5032 Chronic diastolic (congestive) heart failure: Secondary | ICD-10-CM

## 2016-02-13 LAB — CBC
HEMATOCRIT: 39.1 % (ref 36.0–46.0)
HEMOGLOBIN: 12.1 g/dL (ref 12.0–15.0)
MCH: 26.7 pg (ref 26.0–34.0)
MCHC: 30.9 g/dL (ref 30.0–36.0)
MCV: 86.3 fL (ref 78.0–100.0)
Platelets: 214 10*3/uL (ref 150–400)
RBC: 4.53 MIL/uL (ref 3.87–5.11)
RDW: 21 % — AB (ref 11.5–15.5)
WBC: 8.9 10*3/uL (ref 4.0–10.5)

## 2016-02-13 LAB — GLUCOSE, CAPILLARY
GLUCOSE-CAPILLARY: 185 mg/dL — AB (ref 65–99)
GLUCOSE-CAPILLARY: 269 mg/dL — AB (ref 65–99)
Glucose-Capillary: 175 mg/dL — ABNORMAL HIGH (ref 65–99)
Glucose-Capillary: 237 mg/dL — ABNORMAL HIGH (ref 65–99)

## 2016-02-13 LAB — BASIC METABOLIC PANEL
ANION GAP: 8 (ref 5–15)
BUN: 27 mg/dL — ABNORMAL HIGH (ref 6–20)
CALCIUM: 8 mg/dL — AB (ref 8.9–10.3)
CHLORIDE: 106 mmol/L (ref 101–111)
CO2: 26 mmol/L (ref 22–32)
Creatinine, Ser: 0.91 mg/dL (ref 0.44–1.00)
GFR calc Af Amer: >60 mL/min (ref >60)
GFR calc non Af Amer: >60 mL/min (ref >60)
GLUCOSE: 219 mg/dL — AB (ref 65–99)
Potassium: 3.7 mmol/L (ref 3.5–5.1)
Sodium: 140 mmol/L (ref 135–145)

## 2016-02-13 MED ORDER — MOMETASONE FURO-FORMOTEROL FUM 200-5 MCG/ACT IN AERO
2.0000 | INHALATION_SPRAY | Freq: Two times a day (BID) | RESPIRATORY_TRACT | Status: DC
  Administered 2016-02-14 – 2016-02-15 (×2): 2 via RESPIRATORY_TRACT
  Filled 2016-02-13 (×2): qty 8.8

## 2016-02-13 MED ORDER — LEVALBUTEROL TARTRATE 45 MCG/ACT IN AERO: 1.0000 | INHALATION_SPRAY | RESPIRATORY_TRACT | Status: DC | PRN

## 2016-02-13 MED ORDER — PANTOPRAZOLE SODIUM 40 MG PO TBEC
40.0000 mg | DELAYED_RELEASE_TABLET | Freq: Every day | ORAL | Status: DC
  Administered 2016-02-13 – 2016-02-15 (×3): 40 mg via ORAL
  Filled 2016-02-13 (×3): qty 1

## 2016-02-13 MED ORDER — POLYSACCHARIDE IRON COMPLEX 150 MG PO CAPS
150.0000 mg | ORAL_CAPSULE | Freq: Two times a day (BID) | ORAL | Status: DC
  Administered 2016-02-13 – 2016-02-15 (×5): 150 mg via ORAL
  Filled 2016-02-13 (×5): qty 1

## 2016-02-13 MED ORDER — TIOTROPIUM BROMIDE MONOHYDRATE 18 MCG IN CAPS
18.0000 ug | ORAL_CAPSULE | RESPIRATORY_TRACT | Status: DC
  Administered 2016-02-14 – 2016-02-15 (×2): 18 ug via RESPIRATORY_TRACT
  Filled 2016-02-13: qty 5

## 2016-02-13 MED ORDER — INSULIN GLARGINE 100 UNIT/ML ~~LOC~~ SOLN
30.0000 [IU] | Freq: Two times a day (BID) | SUBCUTANEOUS | Status: DC
  Administered 2016-02-13 – 2016-02-15 (×5): 30 [IU] via SUBCUTANEOUS
  Filled 2016-02-13 (×5): qty 0.3

## 2016-02-13 NOTE — Progress Notes (Signed)
[facility] PROGRESS NOTE    Progress Note  Destiny Shelton  [id number] DOB: [birth year] DOA: 02/10/2016 PCP: Marton Redwood, MD     Brief Narrative:   Destiny Shelton is an 70 y.o. female past medical history of COPD on 4 L nasal cannula paroxysmal atrial fibrillation on Xarelto who came into the ED with increased cough and productive sputum. She was started on IV Solu-Medrol, inhaler and antibiotics.  Assessment/Plan:   Acute on chronic respiratory failure with hypoxia due to COPD exacerbation (HCC)/COPD GOLD II : She was started on IV steroids and inhaler and antibiotics. His saturations improved. Transfer to MedSurg  Diabetes mellitus type 2 with neurological manifestations [facility]): Continue Lantus plus sliding scale insulin.  Paroxysmal afib: Rate control continue Xarelto.  Chronic diastolic heart failure: Continue current dose of Lasix she seems to be euvolemic.  Essential hypertension: Cont current regimen.  Depression: Cont Xanax, wellbutrim.  Morbid obesity ([place]) Counseling.  DVT prophylaxis: lovenox Family Communication:none Disposition Plan/Barrier to D/C: in 2-3 days, transferred to [facility] Code Status:     Code Status Orders        Start     Ordered   02/10/16 1425  Full code  Continuous     02/10/16 1425    Code Status History    Date Active Date Inactive Code Status Order ID Comments User Context   01/22/2016  8:54 PM 01/24/2016 10:11 PM Full Code [id number]  Reubin Milan, MD Inpatient   07/04/2015 11:03 AM 07/04/2015  2:45 PM Full Code [id number]  Larey Dresser, MD Inpatient   07/08/2013  4:22 PM 07/13/2013  2:01 PM Full Code [id number]  Marton Redwood, MD ED   05/26/2012  6:06 PM 06/01/2012  2:30 PM Full Code [id number]  Sydnee Levans, RN Inpatient        IV Access:    Peripheral IV   Procedures and diagnostic studies:   No results found.   Medical Consultants:    None.  Anti-Infectives:   Levaquin  Subjective:     Destiny Shelton she relates her breathing is better still anorexic but once ago home.  Objective:    Vitals:   02/13/16 0200 02/13/16 0300 02/13/16 0400 02/13/16 0500  BP: (!) 120/49  132/61   Pulse: 83 94 87 85  Resp: 20 20 18 19   Temp:   97.9 F (36.6 C)   TempSrc:   Oral   SpO2: 93% 96% 94% 95%  Weight:      Height:        Intake/Output Summary (Last 24 hours) at 02/13/16 0742 Last data filed at 02/12/16 2100  Gross per 24 hour  Intake              360 ml  Output             3600 ml  Net            -3240 ml   Filed Weights   02/10/16 1646  Weight: 100.7 kg (222 lb 0.1 oz)    Exam: General exam: In no acute distress. Respiratory system: Air movement and clear to auscultation. Cardiovascular system: S1 & S2 heard, RRR.  Gastrointestinal system: Abdomen is nondistended, soft and nontender.  Central nervous system: Alert and oriented. No focal neurological deficits. Extremities: No pedal edema. Skin: No rashes, lesions or ulcers Psychiatry: Judgement and insight appear normal. Mood & affect appropriate.    Data Reviewed:    Labs: Basic Metabolic Panel:  Recent  Labs Lab 02/10/16 1030 02/11/16 0304 02/13/16 0339  NA 141 140 140  K 3.8 4.3 3.7  CL 105 107 106  CO2 29 26 26   GLUCOSE 127* 165* 219*  BUN 14 12 27*  CREATININE 0.92 0.67 0.91  CALCIUM 8.2* 7.5* 8.0*   GFR Estimated Creatinine Clearance: 66.7 mL/min (by C-G formula based on SCr of 0.91 mg/dL). Liver Function Tests:  Recent Labs Lab 02/10/16 1030 02/11/16 0304  AST 29 20  ALT 25 20  ALKPHOS 84 72  BILITOT 0.4 0.6  PROT 7.3 6.6  ALBUMIN 3.9 3.6    Recent Labs Lab 02/10/16 1030  LIPASE 22   No results for input(s): AMMONIA in the last 168 hours. Coagulation profile No results for input(s): INR, PROTIME in the last 168 hours.  CBC:  Recent Labs Lab 02/10/16 1030 02/11/16 0304 02/13/16 0339  WBC 12.6* 8.5 8.9  NEUTROABS 10.8*  --   --   HGB 11.6* 11.0* 12.1  HCT  39.2 36.4 39.1  MCV 88.9 87.1 86.3  PLT 207 189 214   Cardiac Enzymes: No results for input(s): CKTOTAL, CKMB, CKMBINDEX, TROPONINI in the last 168 hours. BNP (last 3 results) No results for input(s): PROBNP in the last 8760 hours. CBG:  Recent Labs Lab 02/11/16 2120 02/12/16 0808 02/12/16 1232 02/12/16 1544 02/12/16 2130  GLUCAP 160* 167* 217* 155* 242*   D-Dimer: No results for input(s): DDIMER in the last 72 hours. Hgb A1c: No results for input(s): HGBA1C in the last 72 hours. Lipid Profile: No results for input(s): CHOL, HDL, LDLCALC, TRIG, CHOLHDL, LDLDIRECT in the last 72 hours. Thyroid function studies: No results for input(s): TSH, T4TOTAL, T3FREE, THYROIDAB in the last 72 hours.  Invalid input(s): FREET3 Anemia work up: No results for input(s): VITAMINB12, FOLATE, FERRITIN, TIBC, IRON, RETICCTPCT in the last 72 hours. Sepsis Labs:  Recent Labs Lab 02/10/16 1030 02/10/16 1053 02/10/16 1311 02/11/16 0304 02/13/16 0339  WBC 12.6*  --   --  8.5 8.9  LATICACIDVEN  --  1.76 2.21*  --   --    Microbiology Recent Results (from the past 240 hour(s))  Blood Culture (routine x 2)     Status: None (Preliminary result)   Collection Time: 02/10/16 10:45 AM  Result Value Ref Range Status   Specimen Description BLOOD BLOOD LEFT FOREARM  Final   Special Requests NONE  Final   Culture   Final    NO GROWTH 2 DAYS Performed at [facility]    Report Status PENDING  Incomplete  Blood Culture (routine x 2)     Status: None (Preliminary result)   Collection Time: 02/10/16 10:55 AM  Result Value Ref Range Status   Specimen Description BLOOD BLOOD RIGHT HAND  Final   Special Requests BOTTLES DRAWN AEROBIC AND ANAEROBIC 5CC  Final   Culture   Final    NO GROWTH 2 DAYS Performed at [facility]    Report Status PENDING  Incomplete  Urine culture     Status: None   Collection Time: 02/10/16 11:50 AM  Result Value Ref Range Status   Specimen Description  URINE, CATHETERIZED  Final   Special Requests NONE  Final   Culture NO GROWTH Performed at [facility]   Final   Report Status 02/11/2016 FINAL  Final  MRSA PCR Screening     Status: None   Collection Time: 02/10/16  4:45 PM  Result Value Ref Range Status   MRSA by PCR NEGATIVE  NEGATIVE Final    Comment:        The GeneXpert MRSA Assay (FDA approved for NASAL specimens only), is one component of a comprehensive MRSA colonization surveillance program. It is not intended to diagnose MRSA infection nor to guide or monitor treatment for MRSA infections.      Medications:   . atorvastatin  40 mg Oral QHS  . budesonide (PULMICORT) nebulizer solution  0.25 mg Nebulization BID  . buPROPion  150 mg Oral Daily  . cefUROXime  500 mg Oral BID WC  . dextromethorphan-guaiFENesin  1 tablet Oral BID  . DULoxetine  60 mg Oral q morning - 10a  . furosemide  80 mg Oral BID  . gabapentin  600 mg Oral TID  . insulin aspart  0-5 Units Subcutaneous QHS  . insulin aspart  0-9 Units Subcutaneous TID WC  . insulin glargine  30 Units Subcutaneous Daily  . ipratropium-albuterol  3 mL Nebulization TID  . irbesartan  150 mg Oral Daily  . mouth rinse  15 mL Mouth Rinse BID  . methylPREDNISolone (SOLU-MEDROL) injection  60 mg Intravenous Q6H  . polyethylene glycol  17 g Oral Daily  . rivaroxaban  20 mg Oral Q supper  . roflumilast  500 mcg Oral Daily  . sodium chloride flush  3 mL Intravenous Q12H  . verapamil  360 mg Oral Daily   Continuous Infusions:   Time spent: 25 min   LOS: 3 days   Charlynne Cousins  [facility] Pager [phone]  *Please refer to [email or web address], password [email or web address] to get updated schedule on who will round on this patient, as hospitalists switch teams weekly. If 7PM-7AM, please contact night-coverage at [email or web address], password [email or web address] for any overnight needs.  02/13/2016, 7:42 AM

## 2016-02-13 NOTE — Progress Notes (Signed)
Physical Therapy Treatment Patient Details Name: DELMAR KNOLL MRN: [id number] DOB: 10/04/1945 Today's Date: 02/13/2016    History of Present Illness 70 y.o. female with medical history significant of COPD on home oxygen usually at 4 LPM, type 2 diabetes, anemia, anxiety, paroxysmal atrial fibrillation, carotid stenosis, depression, diverticulosis, fibromyalgia, GERD, gastritis, hyperlipidemia, hypertension, osteoarthritis, osteoporosis, sleep apnea who came to the emergency department 9/10 due to SOB.Marland Kitchen Recent fall with L rib factures.    PT Comments    Pt feeling better and moved out of ICU.  In bed on 5 lts sats 92% at rest.  Applied 6 lts nasal for activity and assisted with amb a greater distance in hallway.  (see below)    Follow Up Recommendations  Home health PT;Supervision/Assistance - 24 hour (pt might decline and thats fine.  Pt knowledgable and aware of her current medical condition. )     Equipment Recommendations  None recommended by PT    Recommendations for Other Services       Precautions / Restrictions Precautions Precautions: None Precaution Comments: on 5 liters Hi flow oxygen currently Restrictions Weight Bearing Restrictions: No    Mobility  Bed Mobility Overal bed mobility: Needs Assistance Bed Mobility: Supine to Sit;Sit to Supine     Supine to sit: Supervision Sit to supine: Min guard   General bed mobility comments: increased time but able to self perform  Transfers Overall transfer level: Needs assistance Equipment used: 1 person hand held assist Transfers: Sit to/from Stand Sit to Stand: Min guard;Supervision         General transfer comment: good safety cognition and use of hands to steady self  Ambulation/Gait Ambulation/Gait assistance: Min guard Ambulation Distance (Feet): 52 Feet Assistive device: Rolling walker (2 wheeled) Gait Pattern/deviations: Step-through pattern Gait velocity: decreased   General Gait Details: amb on  6lts sats decreased to 84% and HR increased to 124.  Min c/o dyspnea.  Functional distance.  allowed 5 min recovery.     Stairs            Wheelchair Mobility    Modified Rankin (Stroke Patients Only)       Balance                                    Cognition Arousal/Alertness: Awake/alert Behavior During Therapy: WFL for tasks assessed/performed Overall Cognitive Status: Within Functional Limits for tasks assessed                      Exercises      General Comments        Pertinent Vitals/Pain Pain Assessment: No/denies pain    Home Living                      Prior Function            PT Goals (current goals can now be found in the care plan section) Progress towards PT goals: Progressing toward goals    Frequency  Min 3X/week    PT Plan Current plan remains appropriate    Co-evaluation             End of Session Equipment Utilized During Treatment: Gait belt;Oxygen Activity Tolerance: Patient tolerated treatment well Patient left: in bed;with call bell/phone within reach;with bed alarm set;with family/visitor present     Time: [id number] PT Time Calculation (min) (ACUTE ONLY): 24 min  Charges:  $Gait Training: 8-22 mins $Therapeutic Activity: 8-22 mins                    G Codes:      Rica Koyanagi  PTA [facility]  Acute  Rehab Pager      [phone]

## 2016-02-13 NOTE — Progress Notes (Signed)
Pt transferred off of ICU unit at 1100 by RN.

## 2016-02-13 NOTE — Progress Notes (Signed)
Received patient form ICU, agree with previous assessment

## 2016-02-14 ENCOUNTER — Encounter: Payer: Self-pay | Admitting: Internal Medicine

## 2016-02-14 ENCOUNTER — Ambulatory Visit: Payer: Medicare Other | Admitting: Pulmonary Disease

## 2016-02-14 DIAGNOSIS — J441 Chronic obstructive pulmonary disease with (acute) exacerbation: Principal | ICD-10-CM

## 2016-02-14 LAB — GLUCOSE, CAPILLARY
GLUCOSE-CAPILLARY: 184 mg/dL — AB (ref 65–99)
GLUCOSE-CAPILLARY: 147 mg/dL — AB (ref 65–99)
Glucose-Capillary: 189 mg/dL — ABNORMAL HIGH (ref 65–99)
Glucose-Capillary: 269 mg/dL — ABNORMAL HIGH (ref 65–99)

## 2016-02-14 MED ORDER — PREDNISONE 50 MG PO TABS
60.0000 mg | ORAL_TABLET | Freq: Every day | ORAL | Status: DC
  Administered 2016-02-15: 60 mg via ORAL
  Filled 2016-02-14: qty 1

## 2016-02-14 NOTE — NC FL2 (Deleted)
[place] LEVEL OF CARE SCREENING TOOL     IDENTIFICATION  Patient Name: Destiny Shelton Birthdate: [birth year] Sex: female Admission Date (Current Location): 02/10/2016  [facility] and [place] Number:  Herbalist and Address:  [facility],  [place], [place]      Provider Number: [id number]  Attending Physician Name and Address:  Charlynne Cousins, MD  Relative Name and Phone Number:       Current Level of Care: SNF Recommended Level of Care: [place] Prior Approval Number:    Date Approved/Denied:   PASRR Number:    Discharge Plan: SNF    Current Diagnoses: Patient Active Problem List   Diagnosis Date Noted  . COPD exacerbation ([place]) 01/22/2016  . Biomechanical lesion of rib cage 01/22/2016  . Anemia 01/22/2016  . Chronic diastolic CHF (congestive heart failure) ([place]) 12/12/2015  . Morbid obesity ([place]) 12/29/2014  . Benign neoplasm of sigmoid colon   . Chronic diastolic heart failure of unknown etiology ([place]) 08/17/2013  . Chronic respiratory failure ([place]) 04/21/2013  . Polyposis coli-attenuated 01/24/2013  . Sigmoid tubular adenoma with high-grade dysplasia 10/01/2012  . Gastric polyp 10/01/2012  . Schatzki's ring 10/01/2012  . PAF (paroxysmal atrial fibrillation) ([place]) 05/29/2012  . Pulmonary hypertension ([place]) 04/15/2012  . 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 Obesity, Class II, BMI 35-39.9, with comorbidity (HCC)     Class: Chronic  . History of failed moderate sedation 06/10/2011  . Pulmonary nodules 09/11/2010  . COPD GOLD II  09/11/2010  . CHRONIC DYSPNEA 07/26/2009  . Diabetes mellitus type 2 with neurological manifestations ([place]) 07/29/2007    Class: Diagnosis of  . DIABETIC PERIPHERAL NEUROPATHY 07/29/2007  . Anxiety state 07/29/2007  . Depression 07/29/2007  . Essential hypertension 07/29/2007  . NEPHROLITHIASIS 07/29/2007  . OSTEOARTHRITIS 07/29/2007  . Backache  07/29/2007  . OSTEOPOROSIS 07/29/2007  . Personal history of colonic polyps 04/28/2007    Orientation RESPIRATION BLADDER Height & Weight     Self, Time, Situation, Place  O2 (5L) Continent Weight: 222 lb 0.1 oz (100.7 kg) Height:  5\' 5"  (165.1 cm)  BEHAVIORAL SYMPTOMS/MOOD NEUROLOGICAL BOWEL NUTRITION STATUS      Continent Diet (Heart Healthy)  AMBULATORY STATUS COMMUNICATION OF NEEDS Skin   Extensive Assist Verbally Normal                       Personal Care Assistance Level of Assistance  Bathing, Feeding, Dressing Bathing Assistance: Limited assistance Feeding assistance: Independent Dressing Assistance: Limited assistance     Functional Limitations Info  Sight, Hearing, Speech Sight Info: Adequate Hearing Info: Adequate Speech Info: Adequate    SPECIAL CARE FACTORS FREQUENCY  PT (By licensed PT), OT (By licensed OT)     PT Frequency: 5              Contractures Contractures Info: Not present    Additional Factors Info  Code Status, Allergies Code Status Info: Full Code Allergies Info: Aspirin, Nsaids           Current Medications (02/14/2016):  This is the current hospital active medication list Current Facility-Administered Medications  Medication Dose Route Frequency Provider Last Rate Last Dose  . acetaminophen (TYLENOL) tablet 500 mg  500 mg Oral Q6H PRN Caren Griffins, MD   500 mg at 02/13/16 2143  . albuterol (PROVENTIL) (2.5 MG/3ML) 0.083% nebulizer solution 2.5 mg  2.5 mg Nebulization Q4H PRN Geradine Girt,  DO      . ALPRAZolam Duanne Moron) tablet 0.25 mg  0.25 mg Oral Daily PRN Caren Griffins, MD   0.25 mg at 02/13/16 2132  . atorvastatin (LIPITOR) tablet 40 mg  40 mg Oral QHS Caren Griffins, MD   40 mg at 02/13/16 2133  . buPROPion (WELLBUTRIN XL) 24 hr tablet 150 mg  150 mg Oral Daily Costin Karlyne Greenspan, MD   150 mg at 02/14/16 1005  . cefUROXime (CEFTIN) tablet 500 mg  500 mg Oral BID WC Jessica U Vann, DO   500 mg at 02/14/16 1005  .  dextromethorphan-guaiFENesin (MUCINEX DM) 30-600 MG per 12 hr tablet 1 tablet  1 tablet Oral BID Caren Griffins, MD   1 tablet at 02/14/16 1004  . DULoxetine (CYMBALTA) DR capsule 60 mg  60 mg Oral q morning - 10a Costin Karlyne Greenspan, MD   60 mg at 02/14/16 1004  . furosemide (LASIX) tablet 80 mg  80 mg Oral BID Caren Griffins, MD   80 mg at 02/14/16 1006  . gabapentin (NEURONTIN) capsule 600 mg  600 mg Oral TID Caren Griffins, MD   600 mg at 02/14/16 1004  . HYDROcodone-acetaminophen (NORCO/VICODIN) 5-325 MG per tablet 1 tablet  1 tablet Oral Q4H PRN Caren Griffins, MD   1 tablet at 02/12/16 1445  . insulin aspart (novoLOG) injection 0-5 Units  0-5 Units Subcutaneous QHS Caren Griffins, MD   2 Units at 02/13/16 2131  . insulin aspart (novoLOG) injection 0-9 Units  0-9 Units Subcutaneous TID WC Costin Karlyne Greenspan, MD   2 Units at 02/14/16 1001  . insulin glargine (LANTUS) injection 30 Units  30 Units Subcutaneous BID Charlynne Cousins, MD   30 Units at 02/14/16 1001  . irbesartan (AVAPRO) tablet 150 mg  150 mg Oral Daily Geradine Girt, DO   150 mg at 02/14/16 1004  . iron polysaccharides (NIFEREX) capsule 150 mg  150 mg Oral BID Charlynne Cousins, MD   150 mg at 02/14/16 1006  . MEDLINE mouth rinse  15 mL Mouth Rinse BID Caren Griffins, MD   15 mL at 02/14/16 1000  . methylPREDNISolone sodium succinate (SOLU-MEDROL) 125 mg/2 mL injection 60 mg  60 mg Intravenous Q6H Jessica U Vann, DO   60 mg at 02/14/16 1002  . mometasone-formoterol (DULERA) 200-5 MCG/ACT inhaler 2 puff  2 puff Inhalation BID Charlynne Cousins, MD      . ondansetron [facility]) tablet 4 mg  4 mg Oral Q6H PRN Costin Karlyne Greenspan, MD       Or  . ondansetron (ZOFRAN) injection 4 mg  4 mg Intravenous Q6H PRN Costin Karlyne Greenspan, MD      . pantoprazole (PROTONIX) EC tablet 40 mg  40 mg Oral Daily Charlynne Cousins, MD   40 mg at 02/14/16 1004  . polyethylene glycol (MIRALAX / GLYCOLAX) packet 17 g  17 g Oral Daily Costin Karlyne Greenspan, MD   17 g at 02/14/16 1006  . rivaroxaban (XARELTO) tablet 20 mg  20 mg Oral Q supper Caren Griffins, MD   20 mg at 02/13/16 1800  . roflumilast (DALIRESP) tablet 500 mcg  500 mcg Oral Daily Caren Griffins, MD   500 mcg at 02/14/16 1005  . sodium chloride flush (NS) 0.9 % injection 3 mL  3 mL Intravenous Q12H Costin Karlyne Greenspan, MD   3 mL at 02/14/16 1000  . tiotropium (SPIRIVA) inhalation capsule  18 mcg  18 mcg Inhalation BH-q7a Charlynne Cousins, MD   18 mcg at 02/14/16 [id number]  . verapamil (CALAN-SR) CR tablet 360 mg  360 mg Oral Daily Costin Karlyne Greenspan, MD   360 mg at 02/14/16 1004     Discharge Medications: Please see discharge summary for a list of discharge medications.  Relevant Imaging Results:  Relevant Lab Results:   Additional Information ss# [id number]  Karly Pitter A Shaarav Ripple, LCSW

## 2016-02-14 NOTE — Care Management Important Message (Signed)
Important Message  Patient Details  Name: Destiny Shelton MRN: [id number] Date of Birth: Sep 04, 1945   Medicare Important Message Given:  Yes    Camillo Flaming 02/14/2016, 10:21 AMImportant Message  Patient Details  Name: Destiny Shelton MRN: [id number] Date of Birth: 03-24-1946   Medicare Important Message Given:  Yes    Camillo Flaming 02/14/2016, 10:21 AM

## 2016-02-14 NOTE — Progress Notes (Signed)
[facility] PROGRESS NOTE    Progress Note  Destiny Shelton  [id number] DOB: 1946/05/01 DOA: 02/10/2016 PCP: Marton Redwood, MD     Brief Narrative:   Destiny Shelton is an 70 y.o. female past medical history of COPD on 4 L nasal cannula paroxysmal atrial fibrillation on Xarelto who came into the ED with increased cough and productive sputum. She was started on IV Solu-Medrol, inhaler and antibiotics.  Assessment/Plan:   Acute on chronic respiratory failure with hypoxia due to COPD exacerbation (HCC)/COPD GOLD II : Transition to oral steroids and antibiotics. Her saturation have remains stable even with talking.  Diabetes mellitus type 2 with neurological manifestations [facility]): Continue Lantus plus sliding scale insulin.  Paroxysmal afib: Rate control continue Xarelto.  Chronic diastolic heart failure: Continue current dose of Lasix she seems to be euvolemic.  Essential hypertension: Cont current regimen.  Depression: Cont Xanax, wellbutrim.  Morbid obesity ([place]) Counseling.  DVT prophylaxis: lovenox Family Communication:none Disposition Plan/Barrier to D/C: in am Code Status:     Code Status Orders        Start     Ordered   02/10/16 1425  Full code  Continuous     02/10/16 1425    Code Status History    Date Active Date Inactive Code Status Order ID Comments User Context   01/22/2016  8:54 PM 01/24/2016 10:11 PM Full Code [id number]  Reubin Milan, MD Inpatient   07/04/2015 11:03 AM 07/04/2015  2:45 PM Full Code [id number]  Larey Dresser, MD Inpatient   07/08/2013  4:22 PM 07/13/2013  2:01 PM Full Code [id number]  Marton Redwood, MD ED   05/26/2012  6:06 PM 06/01/2012  2:30 PM Full Code [id number]  Sydnee Levans, RN Inpatient        IV Access:    Peripheral IV   Procedures and diagnostic studies:   No results found.   Medical Consultants:    None.  Anti-Infectives:   Levaquin  Subjective:    Destiny Shelton Breathing cont to  improve.  Objective:    Vitals:   02/14/16 0539 02/14/16 0843 02/14/16 0847 02/14/16 1332  BP: (!) 126/44   (!) 122/57  Pulse: 89   (!) 101  Resp: 19   18  Temp: 98.1 F (36.7 C)   98 F (36.7 C)  TempSrc: Oral   Oral  SpO2: 94% 92% (!) 2% 96%  Weight:      Height:        Intake/Output Summary (Last 24 hours) at 02/14/16 1431 Last data filed at 02/14/16 0800  Gross per 24 hour  Intake              480 ml  Output                0 ml  Net              480 ml   Filed Weights   02/10/16 1646  Weight: 100.7 kg (222 lb 0.1 oz)    Exam: General exam: In no acute distress. Respiratory system: Air movement and clear to auscultation. Cardiovascular system: S1 & S2 heard, RRR.  Gastrointestinal system: Abdomen is nondistended, soft and nontender.  Central nervous system: Alert and oriented. No focal neurological deficits. Extremities: No pedal edema. Skin: No rashes, lesions or ulcers Psychiatry: Judgement and insight appear normal. Mood & affect appropriate.    Data Reviewed:    Labs: Basic Metabolic Panel:  Recent Labs Lab 02/10/16  1030 02/11/16 0304 02/13/16 0339  NA 141 140 140  K 3.8 4.3 3.7  CL 105 107 106  CO2 29 26 26   GLUCOSE 127* 165* 219*  BUN 14 12 27*  CREATININE 0.92 0.67 0.91  CALCIUM 8.2* 7.5* 8.0*   GFR Estimated Creatinine Clearance: 66.7 mL/min (by C-G formula based on SCr of 0.91 mg/dL). Liver Function Tests:  Recent Labs Lab 02/10/16 1030 02/11/16 0304  AST 29 20  ALT 25 20  ALKPHOS 84 72  BILITOT 0.4 0.6  PROT 7.3 6.6  ALBUMIN 3.9 3.6    Recent Labs Lab 02/10/16 1030  LIPASE 22   No results for input(s): AMMONIA in the last 168 hours. Coagulation profile No results for input(s): INR, PROTIME in the last 168 hours.  CBC:  Recent Labs Lab 02/10/16 1030 02/11/16 0304 02/13/16 0339  WBC 12.6* 8.5 8.9  NEUTROABS 10.8*  --   --   HGB 11.6* 11.0* 12.1  HCT 39.2 36.4 39.1  MCV 88.9 87.1 86.3  PLT 207 189 214    Cardiac Enzymes: No results for input(s): CKTOTAL, CKMB, CKMBINDEX, TROPONINI in the last 168 hours. BNP (last 3 results) No results for input(s): PROBNP in the last 8760 hours. CBG:  Recent Labs Lab 02/13/16 1140 02/13/16 1633 02/13/16 2120 02/14/16 0734 02/14/16 1137  GLUCAP 269* 175* 237* 189* 269*   D-Dimer: No results for input(s): DDIMER in the last 72 hours. Hgb A1c: No results for input(s): HGBA1C in the last 72 hours. Lipid Profile: No results for input(s): CHOL, HDL, LDLCALC, TRIG, CHOLHDL, LDLDIRECT in the last 72 hours. Thyroid function studies: No results for input(s): TSH, T4TOTAL, T3FREE, THYROIDAB in the last 72 hours.  Invalid input(s): FREET3 Anemia work up: No results for input(s): VITAMINB12, FOLATE, FERRITIN, TIBC, IRON, RETICCTPCT in the last 72 hours. Sepsis Labs:  Recent Labs Lab 02/10/16 1030 02/10/16 1053 02/10/16 1311 02/11/16 0304 02/13/16 0339  WBC 12.6*  --   --  8.5 8.9  LATICACIDVEN  --  1.76 2.21*  --   --    Microbiology Recent Results (from the past 240 hour(s))  Blood Culture (routine x 2)     Status: None (Preliminary result)   Collection Time: 02/10/16 10:45 AM  Result Value Ref Range Status   Specimen Description BLOOD BLOOD LEFT FOREARM  Final   Special Requests NONE  Final   Culture   Final    NO GROWTH 3 DAYS Performed at [facility]    Report Status PENDING  Incomplete  Blood Culture (routine x 2)     Status: None (Preliminary result)   Collection Time: 02/10/16 10:55 AM  Result Value Ref Range Status   Specimen Description BLOOD BLOOD RIGHT HAND  Final   Special Requests BOTTLES DRAWN AEROBIC AND ANAEROBIC 5CC  Final   Culture   Final    NO GROWTH 3 DAYS Performed at [facility]    Report Status PENDING  Incomplete  Urine culture     Status: None   Collection Time: 02/10/16 11:50 AM  Result Value Ref Range Status   Specimen Description URINE, CATHETERIZED  Final   Special Requests NONE   Final   Culture NO GROWTH Performed at [facility]   Final   Report Status 02/11/2016 FINAL  Final  MRSA PCR Screening     Status: None   Collection Time: 02/10/16  4:45 PM  Result Value Ref Range Status   MRSA by PCR NEGATIVE NEGATIVE Final  Comment:        The GeneXpert MRSA Assay (FDA approved for NASAL specimens only), is one component of a comprehensive MRSA colonization surveillance program. It is not intended to diagnose MRSA infection nor to guide or monitor treatment for MRSA infections.      Medications:   . atorvastatin  40 mg Oral QHS  . buPROPion  150 mg Oral Daily  . cefUROXime  500 mg Oral BID WC  . dextromethorphan-guaiFENesin  1 tablet Oral BID  . DULoxetine  60 mg Oral q morning - 10a  . furosemide  80 mg Oral BID  . gabapentin  600 mg Oral TID  . insulin aspart  0-5 Units Subcutaneous QHS  . insulin aspart  0-9 Units Subcutaneous TID WC  . insulin glargine  30 Units Subcutaneous BID  . irbesartan  150 mg Oral Daily  . iron polysaccharides  150 mg Oral BID  . mouth rinse  15 mL Mouth Rinse BID  . methylPREDNISolone (SOLU-MEDROL) injection  60 mg Intravenous Q6H  . mometasone-formoterol  2 puff Inhalation BID  . pantoprazole  40 mg Oral Daily  . polyethylene glycol  17 g Oral Daily  . rivaroxaban  20 mg Oral Q supper  . roflumilast  500 mcg Oral Daily  . sodium chloride flush  3 mL Intravenous Q12H  . tiotropium  18 mcg Inhalation BH-q7a  . verapamil  360 mg Oral Daily   Continuous Infusions:   Time spent: 15 min   LOS: 4 days   Destiny Shelton  [facility] Pager [phone]  *Please refer to [email or web address], password [email or web address] to get updated schedule on who will round on this patient, as hospitalists switch teams weekly. If 7PM-7AM, please contact night-coverage at [email or web address], password [email or web address] for any overnight needs.  02/14/2016, 2:31 PM

## 2016-02-14 NOTE — Progress Notes (Signed)
Date:  February 14, 2016 Chart reviewed for concurrent status and case management needs. Will continue to follow the patient for status change: iv solu-medrol and bronchodilators ongoing.  Desats easily with activites. Discharge Planning: following for needs Expected discharge date: [id number] Velva Harman, BSN, [place], [place]

## 2016-02-14 NOTE — Progress Notes (Signed)
Patient reports she is not interested in going to SNF at this time.She plans to return home with spouse.  [place] signing off at this time.

## 2016-02-15 LAB — CULTURE, BLOOD (ROUTINE X 2)
CULTURE: NO GROWTH
Culture: NO GROWTH

## 2016-02-15 LAB — GLUCOSE, CAPILLARY: Glucose-Capillary: 94 mg/dL (ref 65–99)

## 2016-02-15 MED ORDER — CEFUROXIME AXETIL 500 MG PO TABS: 500.0000 mg | ORAL_TABLET | Freq: Two times a day (BID) | ORAL | 0 refills | Status: DC

## 2016-02-15 MED ORDER — PREDNISONE 10 MG PO TABS: ORAL_TABLET | ORAL | 0 refills | Status: DC

## 2016-02-15 NOTE — Discharge Summary (Signed)
Physician Discharge Summary  Destiny Shelton [facility] [id number] DOB: Feb 04, 1946 DOA: 02/10/2016  PCP: Marton Redwood, MD  Admit date: 02/10/2016 Discharge date: 02/15/2016  Admitted From:Home Disposition:  Home  Recommendations for Outpatient Follow-up:  1. Follow up with PCP in 1-2 weeks 2. Please obtain BMP/CBC in one week 3. Follow-up with PCP in check saturations and breathing status.  Home Health:yes Equipment/Devices:none  Discharge Condition:stable CODE STATUS:Full Diet recommendation: Carb Modified  Brief/Interim Summary: Destiny Shelton is an 71 y.o. female past medical history of COPD on 4 L nasal cannula paroxysmal atrial fibrillation on Xarelto who came into the ED with increased cough and productive sputum.   Discharge Diagnoses:  Acute on chronic respiratory failure with hypoxia due to COPD exacerbation (HCC)/COPD GOLD II : She was admitted to the stepdown and started on BiPAP, IV Solu-Medrol steroids and inhalers. She did improve remarkable turnaround within 24 hours, she was continue on IV steroids for 1 additional day then transitioned to oral steroids which she will continue as an outpatient.  She'll go home on a 2 week steroid taper 2 additional days of IV antibiotics and discharge home.  Diabetes mellitus type 2 with neurological manifestations [facility]): No changes made.  Paroxysmal afib: Rate control continue Xarelto.  Chronic diastolic heart failure: No changes made.  Essential hypertension: Cont current regimen.  Depression: Cont Xanax, wellbutrim.  Morbid obesity ([place]) Counseling.    Discharge Instructions     Medication List    TAKE these medications   acetaminophen 500 MG tablet Commonly known as:  TYLENOL 500 mg every 6 (six) hours as needed for mild pain. Take per bottle as needed for pain   albuterol (2.5 MG/3ML) 0.083% nebulizer solution Commonly known as:  PROVENTIL Take 3 mLs (2.5 mg total) by nebulization every 6 (six) hours  as needed for wheezing or shortness of breath.   ALPRAZolam 0.5 MG tablet Commonly known as:  XANAX Take 0.25 mg by mouth daily as needed for anxiety or sleep.   atorvastatin 40 MG tablet Commonly known as:  LIPITOR Take 40 mg by mouth at bedtime.   benzonatate 200 MG capsule Commonly known as:  TESSALON Take 200 mg by mouth at bedtime as needed for cough.   buPROPion 150 MG 24 hr tablet Commonly known as:  WELLBUTRIN XL Take 150 mg by mouth every morning.   cefUROXime 500 MG tablet Commonly known as:  CEFTIN Take 1 tablet (500 mg total) by mouth 2 (two) times daily with a meal.   DALIRESP 500 MCG Tabs tablet Generic drug:  roflumilast Take 500 mcg by mouth daily.   DULoxetine 60 MG capsule Commonly known as:  CYMBALTA Take 60 mg by mouth every morning.   furosemide 40 MG tablet Commonly known as:  LASIX Take 80 mg by mouth 2 (two) times daily.   gabapentin 600 MG tablet Commonly known as:  NEURONTIN Take 600 mg by mouth 3 (three) times daily.   HYDROcodone-acetaminophen 5-325 MG tablet Commonly known as:  NORCO/VICODIN Take 1 tablet by mouth every 4 (four) hours as needed for moderate pain. Reported on 12/12/2015   insulin glargine 100 UNIT/ML injection Commonly known as:  LANTUS Inject 0.66 mLs (66 Units total) into the skin daily before breakfast.   insulin lispro 100 UNIT/ML injection Commonly known as:  HUMALOG Inject 8 Units into the skin 2 (two) times daily.   irbesartan 150 MG tablet Commonly known as:  AVAPRO Take 1 tablet (150 mg total) by mouth daily. KEEP OV.  iron polysaccharides 150 MG capsule Commonly known as:  NIFEREX Take 1 capsule (150 mg total) by mouth 2 (two) times daily.   levalbuterol 45 MCG/ACT inhaler Commonly known as:  XOPENEX HFA Inhale 1-2 puffs into the lungs every 4 (four) hours as needed for wheezing.   multivitamin tablet Take 1 tablet by mouth every morning.   omeprazole 20 MG capsule Commonly known as:   PRILOSEC Take 20 mg by mouth daily.   ONE TOUCH ULTRA TEST test strip Generic drug:  glucose blood 1 each by Other route 3 (three) times daily.   OXYGEN 4 L at baseline, Increase oxygen to 6L with acitivity   polyethylene glycol packet Commonly known as:  MIRALAX / GLYCOLAX Take 17 g by mouth daily.   potassium chloride 10 MEQ tablet Commonly known as:  K-DUR,KLOR-CON Take 3 tablets (30 mEq total) by mouth 2 (two) times daily.   predniSONE 10 MG tablet Commonly known as:  DELTASONE Takes 6 tablets for 2 days, then 5 tablets for 2 days, then 4 tablets for 2 days, then 3 tablets for 2 days, then 2 tabs for 2 days, then 1 tab for 2 days, and then stop. What changed:  medication strength  additional instructions   rivaroxaban 20 MG Tabs tablet Commonly known as:  XARELTO Take 1 tablet (20 mg total) by mouth daily with supper. Restart on Wednesday May 11 , 2016   SYMBICORT 160-4.5 MCG/ACT inhaler Generic drug:  budesonide-formoterol INHALE 2 PUFFS BY MOUTH TWICE DAILY   tiotropium 18 MCG inhalation capsule Commonly known as:  SPIRIVA Place 1 capsule (18 mcg total) into inhaler and inhale every morning.   verapamil 360 MG 24 hr capsule Commonly known as:  VERELAN PM TAKE ONE CAPSULE BY MOUTH DAILY What changed:  See the new instructions.       Allergies  Allergen Reactions  . Aspirin Shortness Of Breath  . Nsaids Shortness Of Breath    Consultations:  None   Procedures/Studies: Dg Chest 1 View  Result Date: 02/10/2016 CLINICAL DATA:  Hypoglycemia with shortness of breath and weakness, decreased O2 sats history of COPD EXAM: CHEST 1 VIEW COMPARISON:  01/22/2016 FINDINGS: AP portable semi erect image of the chest. Mild diffuse interstitial prominence as before. Streaky subsegmental atelectasis versus scarring in the left lung base. No acute consolidation or effusion. Cardiomediastinal silhouette upper limits of normal with atherosclerosis of the aorta. No  pneumothorax. IMPRESSION: 1. No acute consolidation or effusion 2. Diffuse interstitial prominence with subsegmental atelectasis or scarring in the left base 3. Atherosclerotic vascular calcifications of the aorta Electronically Signed   By: Donavan Foil M.D.   On: 02/10/2016 11:49   Dg Ribs Unilateral W/chest Left  Result Date: 01/22/2016 CLINICAL DATA:  Fall 3 days ago, left rib pain EXAM: LEFT RIBS AND CHEST - 3+ VIEW COMPARISON:  12/28/2014 FINDINGS: Three views left ribs submitted. Study is limited by diffuse osteopenia. Mild perihilar bronchitic changes. Streaky bilateral basilar atelectasis, scarring or infiltrate. Question nondisplaced fracture of the left seventh rib. Question nondisplaced fracture of the left 8 rib. No pneumothorax. IMPRESSION: Study is limited by diffuse osteopenia. Mild perihilar bronchitic changes. Streaky bilateral basilar atelectasis, scarring or infiltrate. Question nondisplaced fracture of the left seventh rib. Question nondisplaced fracture of the left 8 rib. No pneumothorax. Electronically Signed   By: Lahoma Crocker M.D.   On: 01/22/2016 11:57   Ct Chest W Contrast  Result Date: 01/22/2016 CLINICAL DATA:  Increasing shortness of breath. Left rib fractures  post fall. History of COPD. EXAM: CT CHEST WITH CONTRAST TECHNIQUE: Multidetector CT imaging of the chest was performed during intravenous contrast administration. CONTRAST:  80mL ISOVUE-300 IOPAMIDOL (ISOVUE-300) INJECTION 61% COMPARISON:  Chest radiograph 01/22/2016, CT of the chest 11/06/202013 FINDINGS: Cardiovascular: The heart is normal in size. There is no pericardial effusion. There is no evidence of central pulmonary embolus or thoracic aortic dissection. The segmental and subsegmental branches of the lower lobes are poorly evaluated due to breathing motion artifact. Mediastinum/Nodes: Stable borderline superior mediastinal lymphadenopathy with left pretracheal lymph node 11 in short axis an ABI window node measuring  11 mm in short axis. Lungs/Pleura: Upper lobe predominant traction bronchiectasis, and mild emphysematous changes. Hyperventilatory changes of bilateral lung bases and right middle lobe. Upper Abdomen: No acute findings. Musculoskeletal: No chest wall mass or suspicious bone lesions identified. IMPRESSION: No evidence of central pulmonary embolus or thoracic dissection. COPD. Electronically Signed   By: Fidela Salisbury M.D.   On: 01/22/2016 18:09    (Echo, Carotid, EGD, Colonoscopy, ERCP)    Subjective: No new complaints she feels great tolerating her diet and her medication.  Discharge Exam: Vitals:   02/15/16 0432 02/15/16 0435  BP:  123/78  Pulse:  85  Resp:  16  Temp: 98.5 F (36.9 C) 98.9 F (37.2 C)   Vitals:   02/14/16 1332 02/14/16 2007 02/15/16 0432 02/15/16 0435  BP: (!) 122/57 (!) 114/53  123/78  Pulse: (!) 101 87  85  Resp: 18 20  16   Temp: 98 F (36.7 C) 98.6 F (37 C) 98.5 F (36.9 C) 98.9 F (37.2 C)  TempSrc: Oral Oral Oral Oral  SpO2: 96% 93%  100%  Weight:      Height:        General: Pt is alert, awake, not in acute distress Cardiovascular: RRR, S1/S2 +, no rubs, no gallops Respiratory: CTA bilaterally, no wheezing, no rhonchi Abdominal: Soft, NT, ND, bowel sounds + Extremities: no edema, no cyanosis    The results of significant diagnostics from this hospitalization (including imaging, microbiology, ancillary and laboratory) are listed below for reference.     Microbiology: Recent Results (from the past 240 hour(s))  Blood Culture (routine x 2)     Status: None (Preliminary result)   Collection Time: 02/10/16 10:45 AM  Result Value Ref Range Status   Specimen Description BLOOD BLOOD LEFT FOREARM  Final   Special Requests NONE  Final   Culture   Final    NO GROWTH 4 DAYS Performed at [facility]    Report Status PENDING  Incomplete  Blood Culture (routine x 2)     Status: None (Preliminary result)   Collection Time: 02/10/16  10:55 AM  Result Value Ref Range Status   Specimen Description BLOOD BLOOD RIGHT HAND  Final   Special Requests BOTTLES DRAWN AEROBIC AND ANAEROBIC 5CC  Final   Culture   Final    NO GROWTH 4 DAYS Performed at [facility]    Report Status PENDING  Incomplete  Urine culture     Status: None   Collection Time: 02/10/16 11:50 AM  Result Value Ref Range Status   Specimen Description URINE, CATHETERIZED  Final   Special Requests NONE  Final   Culture NO GROWTH Performed at [facility]   Final   Report Status 02/11/2016 FINAL  Final  MRSA PCR Screening     Status: None   Collection Time: 02/10/16  4:45 PM  Result Value Ref  Range Status   MRSA by PCR NEGATIVE NEGATIVE Final    Comment:        The GeneXpert MRSA Assay (FDA approved for NASAL specimens only), is one component of a comprehensive MRSA colonization surveillance program. It is not intended to diagnose MRSA infection nor to guide or monitor treatment for MRSA infections.      Labs: BNP (last 3 results)  Recent Labs  11/19/15 1025 01/22/16 1632 02/10/16 1030  BNP 30.3 23.7 [id number]   Basic Metabolic Panel:  Recent Labs Lab 02/10/16 1030 02/11/16 0304 02/13/16 0339  NA 141 140 140  K 3.8 4.3 3.7  CL 105 107 106  CO2 29 26 26   GLUCOSE 127* 165* 219*  BUN 14 12 27*  CREATININE 0.92 0.67 0.91  CALCIUM 8.2* 7.5* 8.0*   Liver Function Tests:  Recent Labs Lab 02/10/16 1030 02/11/16 0304  AST 29 20  ALT 25 20  ALKPHOS 84 72  BILITOT 0.4 0.6  PROT 7.3 6.6  ALBUMIN 3.9 3.6    Recent Labs Lab 02/10/16 1030  LIPASE 22   No results for input(s): AMMONIA in the last 168 hours. CBC:  Recent Labs Lab 02/10/16 1030 02/11/16 0304 02/13/16 0339  WBC 12.6* 8.5 8.9  NEUTROABS 10.8*  --   --   HGB 11.6* 11.0* 12.1  HCT 39.2 36.4 39.1  MCV 88.9 87.1 86.3  PLT 207 189 214   Cardiac Enzymes: No results for input(s): CKTOTAL, CKMB, CKMBINDEX, TROPONINI in the last 168  hours. BNP: Invalid input(s): POCBNP CBG:  Recent Labs Lab 02/14/16 0734 02/14/16 1137 02/14/16 1641 02/14/16 2011 02/15/16 0746  GLUCAP 189* 269* 184* 147* 94   D-Dimer No results for input(s): DDIMER in the last 72 hours. Hgb A1c No results for input(s): HGBA1C in the last 72 hours. Lipid Profile No results for input(s): CHOL, HDL, LDLCALC, TRIG, CHOLHDL, LDLDIRECT in the last 72 hours. Thyroid function studies No results for input(s): TSH, T4TOTAL, T3FREE, THYROIDAB in the last 72 hours.  Invalid input(s): FREET3 Anemia work up No results for input(s): VITAMINB12, FOLATE, FERRITIN, TIBC, IRON, RETICCTPCT in the last 72 hours. Urinalysis    Component Value Date/Time   COLORURINE YELLOW 02/10/2016 1150   APPEARANCEUR CLEAR 02/10/2016 1150   LABSPEC 1.011 02/10/2016 1150   PHURINE 5.5 02/10/2016 1150   GLUCOSEU NEGATIVE 02/10/2016 1150   HGBUR NEGATIVE 02/10/2016 [place] 02/10/2016 1150   KETONESUR NEGATIVE 02/10/2016 1150   PROTEINUR NEGATIVE 02/10/2016 1150   UROBILINOGEN 0.2 07/09/2013 0058   NITRITE NEGATIVE 02/10/2016 1150   LEUKOCYTESUR NEGATIVE 02/10/2016 1150   Sepsis Labs Invalid input(s): PROCALCITONIN,  WBC,  LACTICIDVEN Microbiology Recent Results (from the past 240 hour(s))  Blood Culture (routine x 2)     Status: None (Preliminary result)   Collection Time: 02/10/16 10:45 AM  Result Value Ref Range Status   Specimen Description BLOOD BLOOD LEFT FOREARM  Final   Special Requests NONE  Final   Culture   Final    NO GROWTH 4 DAYS Performed at [facility]    Report Status PENDING  Incomplete  Blood Culture (routine x 2)     Status: None (Preliminary result)   Collection Time: 02/10/16 10:55 AM  Result Value Ref Range Status   Specimen Description BLOOD BLOOD RIGHT HAND  Final   Special Requests BOTTLES DRAWN AEROBIC AND ANAEROBIC 5CC  Final   Culture   Final    NO GROWTH 4 DAYS Performed at [facility]     Report Status PENDING  Incomplete  Urine culture     Status: None   Collection Time: 02/10/16 11:50 AM  Result Value Ref Range Status   Specimen Description URINE, CATHETERIZED  Final   Special Requests NONE  Final   Culture NO GROWTH Performed at [facility]   Final   Report Status 02/11/2016 FINAL  Final  MRSA PCR Screening     Status: None   Collection Time: 02/10/16  4:45 PM  Result Value Ref Range Status   MRSA by PCR NEGATIVE NEGATIVE Final    Comment:        The GeneXpert MRSA Assay (FDA approved for NASAL specimens only), is one component of a comprehensive MRSA colonization surveillance program. It is not intended to diagnose MRSA infection nor to guide or monitor treatment for MRSA infections.      Time coordinating discharge: Over 30 minutes  SIGNED:   Charlynne Cousins, MD  [facility] 02/15/2016, 8:01 AM Pager   If 7PM-7AM, please contact night-coverage [email or web address] Password [email or web address]

## 2016-02-15 NOTE — Progress Notes (Signed)
SATURATION QUALIFICATIONS: (This note is used to comply with regulatory documentation for home oxygen)  Saturations at rest on 6L of oxygen: 95  Patient Saturations on 6 Liters of oxygen while Ambulating = 90-92%  Please briefly explain why patient needs home oxygen: On home oxygen at home-back to baseline. Patient uses 6L while ambulating at home and 4L at rest.

## 2016-02-15 NOTE — Progress Notes (Signed)
Pt given discharge instructions and all questions answered. Pt will be taken home by husband via private vehicle. Home O2 tank at bedside.

## 2016-02-20 ENCOUNTER — Other Ambulatory Visit (HOSPITAL_COMMUNITY): Payer: Self-pay | Admitting: *Deleted

## 2016-02-20 MED ORDER — POTASSIUM CHLORIDE CRYS ER 10 MEQ PO TBCR: 30.0000 meq | EXTENDED_RELEASE_TABLET | Freq: Two times a day (BID) | ORAL | 3 refills | Status: DC

## 2016-02-22 DIAGNOSIS — R350 Frequency of micturition: Secondary | ICD-10-CM | POA: Diagnosis not present

## 2016-03-07 ENCOUNTER — Encounter (HOSPITAL_COMMUNITY): Payer: Self-pay

## 2016-03-07 ENCOUNTER — Ambulatory Visit (HOSPITAL_COMMUNITY)
Admission: RE | Admit: 2016-03-07 | Discharge: 2016-03-07 | Disposition: A | Discharge status: Home / Self Care | Payer: Medicare Other | Source: Ambulatory Visit | Attending: Cardiology | Admitting: Cardiology

## 2016-03-07 VITALS — BP 114/43 | HR 109 | Ht 65.0 in | Wt 216.8 lb

## 2016-03-07 DIAGNOSIS — Z9981 Dependence on supplemental oxygen: Secondary | ICD-10-CM | POA: Insufficient documentation

## 2016-03-07 DIAGNOSIS — G4733 Obstructive sleep apnea (adult) (pediatric): Secondary | ICD-10-CM | POA: Diagnosis not present

## 2016-03-07 DIAGNOSIS — E119 Type 2 diabetes mellitus without complications: Secondary | ICD-10-CM | POA: Insufficient documentation

## 2016-03-07 DIAGNOSIS — I5032 Chronic diastolic (congestive) heart failure: Secondary | ICD-10-CM | POA: Diagnosis not present

## 2016-03-07 DIAGNOSIS — Z79899 Other long term (current) drug therapy: Secondary | ICD-10-CM | POA: Insufficient documentation

## 2016-03-07 DIAGNOSIS — I272 Pulmonary hypertension, unspecified: Secondary | ICD-10-CM | POA: Insufficient documentation

## 2016-03-07 DIAGNOSIS — Z7901 Long term (current) use of anticoagulants: Secondary | ICD-10-CM | POA: Insufficient documentation

## 2016-03-07 DIAGNOSIS — R000 Tachycardia, unspecified: Secondary | ICD-10-CM | POA: Diagnosis not present

## 2016-03-07 DIAGNOSIS — I480 Paroxysmal atrial fibrillation: Secondary | ICD-10-CM | POA: Insufficient documentation

## 2016-03-07 DIAGNOSIS — Z794 Long term (current) use of insulin: Secondary | ICD-10-CM | POA: Insufficient documentation

## 2016-03-07 DIAGNOSIS — J449 Chronic obstructive pulmonary disease, unspecified: Secondary | ICD-10-CM | POA: Insufficient documentation

## 2016-03-07 DIAGNOSIS — E669 Obesity, unspecified: Secondary | ICD-10-CM | POA: Diagnosis not present

## 2016-03-07 DIAGNOSIS — I110 Hypertensive heart disease with heart failure: Secondary | ICD-10-CM | POA: Diagnosis not present

## 2016-03-07 NOTE — Patient Instructions (Signed)
Increase Furosemide (Lasix) to 120 mg (1 & 1/2 tabs) in AM and 80 mg in PM for 3 DAYS ONLY, then back to 80 mg Twice daily   Increase Potassium to 40 meq Twice daily FOR 3 DAYS ONLY, then back to 30 meq Twice daily   Your physician recommends that you schedule a follow-up appointment in: 6 weeks

## 2016-03-08 NOTE — Progress Notes (Signed)
Patient ID: Destiny Shelton, female   DOB: 08-Sep-1945, 70 y.o.   MRN: [id number]  PCP: Dr. Brigitte Pulse HF Cardiology: Dr. Aundra Dubin  70 yo with history of paroxysmal atrial fibrillation, chronic diastolic CHF with restrictive hemodynamics on 2013 RHC, COPD on home oxygen, inappropriate sinus tachycardia, and concern for intracardiac shunting presents for CHF clinic evaluation.  She has been followed for several years by both cardiology and pulmonology.  She is a prior smoker and has been diagnosed with COPD.  Interestingly, her PFTs in 10/16 showed improvement, suggesting mild obstructive airways disease.  However, she still requires home oxygen.  She had transcranial dopplers in 2013 suggesting a medium-sized right to left shunt.  This led to an extensive workup.  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  Cardiac MRI was unremarkable, no evidence for shunt lesion.  RHC/LHC showed no coronary disease but it did show restrictive hemodynamics and volume overload.  Qp/Qs from this study was 1.27/1, suggesting a relatively small left to right shunt.  A definite shunt lesion was never identified.    Echo in 2/17 showed normal LV size and systolic function and normal RV.  I also did a RHC in 2/17: on higher dose diuretics, she had mild pulmonary hypertension and normal PCWP.  There was no evidence for a shunt lesion with Qp/Qs 0.97.   She was admitted in 9/17 with acute exacerbation of COPD.  She was treated with steroids and nebs.  Since that time, she has not felt good. Weight is down 2 lbs.  Breathing is stable.  She uses a walker.  Ok with slow walking on flat ground.  Dyspnea if she tries to walk fast.  No chest pain.  Chronic orthopnea, has slept in a recliner since the 1990s.  No BRBPR or melena.    Labs (9/16): digoxin 0.6, K 4.3, creatinine 0.85 Labs (1/17): K 3.9, creatinine 0.96, hgb 8.9 Labs (3/17): K 4.2, creatinine 0.96 Labs (6/17): K 4.2, creatinine 1.03 Labs (8/17): K 4.1,  creatinine 0.81, HCT 34.3 Labs (9/17): K 3.7, creatinine 0.91, hgb 12.1  PMH: 1. Atrial fibrillation: Paroxysmal.  2. HTN 3. COPD: Home oxygen.  PFTs (10/16) with FVC 77%, FEV1 68%, TLC 89% => mild obstruction (improved from the past).   4. Type II diabetes 5. Obesity 6. Inappropriate sinus tachycardia 7. OSA: Does not use CPAP, uses oxygen at night.  8. Chronic diastolic CHF: RHC/LHC in 0000000 with no significant CAD, mean RA 19, RV 46/18, PA 45/29 mean 37, mean PCWP 22, CI 4.1, PVR 1.4 WU => restrictive hemodynamics with no evidence for constriction;  Qp/Qs 1.27/1; saturation run difficult to interpret.  TEE (10/13): EF 55-60%, LVH, mild MR, bubble study showed no evidence for immediate R=>L bubble crossing, small amount of bubbles ended up in left heart > 5 seconds after injection suggesting possible intrapulmonary shunting; no shunt by color doppler.  Cardiac MRI (8/13) with EF 59%, no LGE, normal RV size/systolic function => interpreted as normal.  RHC 07/04/2015: RA mean 9, RV 45/10, PA 41/20 mean 30, PCWP mean 13, no step up on shunt run,cardiac output (Fick) 6.7 cardiac index (Fick) 3.3, PVR 2.5 WU, Qp/Qs = 0.97. Echo (2/17) with EF 55-60%, mild LVH, mild MR, normal RV size and systolic function.  9. ?Intracardiac shunt: Transcranial dopplers in 10/13 were suggestive of medium-sized right to left shunt.  Bubble study on TEE in 10/13 showed no evidence for immediate R=>L bubble crossing, small amount of bubbles ended up in  left heart > 5 seconds after injection suggesting possible intrapulmonary shunting; no shunt by color doppler. Cardiac MRI showed no evidence for shunting.  Cardiac cath in 2013 actually was suggestive of a small left to right shunt.  Cardiac cath in 2/17 NOT suggestive of significant shunt, Qp/Qs 0.97.   SH: Married, prior heavy smoker (quit 1996).  No ETOH.   Family History  Problem Relation Age of Onset  . Heart disease Mother   . Stroke Mother   . Heart disease Father     . Heart disease Brother   . Stroke Brother   . Skin cancer Brother   . Colon polyps Sister   . Diabetes Sister   . Diabetes Brother   . Irritable bowel syndrome Daughter   . Colon cancer Neg Hx    ROS: All systems reviewed and negative except as per HPI.   Current Outpatient Prescriptions  Medication Sig Dispense Refill  . acetaminophen (TYLENOL) 500 MG tablet 500 mg every 6 (six) hours as needed for mild pain. Take per bottle as needed for pain    . albuterol (PROVENTIL) (2.5 MG/3ML) 0.083% nebulizer solution Take 3 mLs (2.5 mg total) by nebulization every 6 (six) hours as needed for wheezing or shortness of breath. 75 mL 3  . ALPRAZolam (XANAX) 0.5 MG tablet Take 0.25 mg by mouth daily as needed for anxiety or sleep.     Marland Kitchen atorvastatin (LIPITOR) 40 MG tablet Take 40 mg by mouth at bedtime.     . benzonatate (TESSALON) 200 MG capsule Take 200 mg by mouth at bedtime as needed for cough.     Marland Kitchen buPROPion (WELLBUTRIN XL) 150 MG 24 hr tablet Take 150 mg by mouth every morning.     Marland Kitchen DALIRESP 500 MCG TABS tablet Take 500 mcg by mouth daily.   6  . DULoxetine (CYMBALTA) 60 MG capsule Take 60 mg by mouth every morning.     . furosemide (LASIX) 40 MG tablet Take 80 mg by mouth 2 (two) times daily.    Marland Kitchen gabapentin (NEURONTIN) 600 MG tablet Take 600 mg by mouth 3 (three) times daily.     Marland Kitchen HYDROcodone-acetaminophen (NORCO/VICODIN) 5-325 MG per tablet Take 1 tablet by mouth every 4 (four) hours as needed for moderate pain. Reported on 12/12/2015    . insulin glargine (LANTUS) 100 UNIT/ML injection Inject 0.66 mLs (66 Units total) into the skin daily before breakfast.    . insulin lispro (HUMALOG) 100 UNIT/ML injection Inject 8 Units into the skin 2 (two) times daily.    . irbesartan (AVAPRO) 150 MG tablet Take 1 tablet (150 mg total) by mouth daily. KEEP OV. 90 tablet 0  . iron polysaccharides (NIFEREX) 150 MG capsule Take 1 capsule (150 mg total) by mouth 2 (two) times daily. 60 capsule 0  .  levalbuterol (XOPENEX HFA) 45 MCG/ACT inhaler Inhale 1-2 puffs into the lungs every 4 (four) hours as needed for wheezing. 1 Inhaler 12  . Multiple Vitamin (MULTIVITAMIN) tablet Take 1 tablet by mouth every morning.     Marland Kitchen omeprazole (PRILOSEC) 20 MG capsule Take 20 mg by mouth daily.     . ONE TOUCH ULTRA TEST test strip 1 each by Other route 3 (three) times daily.   4  . OXYGEN 4 L at baseline, Increase oxygen to 6L with acitivity    . polyethylene glycol (MIRALAX / GLYCOLAX) packet Take 17 g by mouth daily.    . potassium chloride (K-DUR,KLOR-CON) 10 MEQ tablet Take  3 tablets (30 mEq total) by mouth 2 (two) times daily. 180 tablet 3  . rivaroxaban (XARELTO) 20 MG TABS tablet Take 1 tablet (20 mg total) by mouth daily with supper. Restart on Wednesday May 11 , 2016 30 tablet   . solifenacin (VESICARE) 5 MG tablet Take 5 mg by mouth daily.    . SYMBICORT 160-4.5 MCG/ACT inhaler INHALE 2 PUFFS BY MOUTH TWICE DAILY 10.2 g 5  . tiotropium (SPIRIVA) 18 MCG inhalation capsule Place 1 capsule (18 mcg total) into inhaler and inhale every morning. 30 capsule 11  . verapamil (VERELAN PM) 360 MG 24 hr capsule TAKE ONE CAPSULE BY MOUTH DAILY (Patient taking differently: TAKE 360 MG BY MOUTH DAILY) 30 capsule 4   No current facility-administered medications for this encounter.    BP (!) 114/43 (BP Location: Right Arm, Patient Position: Sitting, Cuff Size: Normal)   Pulse (!) 109   Ht 5\' 5"  (1.651 m)   Wt 216 lb 12.8 oz (98.3 kg)   BMI 36.08 kg/m  General: NAD, obese, wearing oxygen.  Neck: JVP 8 cm, no thyromegaly or thyroid nodule.  Lungs: Slight crackles at bases bilaterally.  CV: Nondisplaced PMI.  Heart mildly tachy, regular S1/S2, no S3/S4, 2/6 HSM LLSB.  Right ankle 2+ edema, left ankle trace edema.  No carotid bruit.  Normal pedal pulses.  Abdomen: Soft, nontender, no hepatosplenomegaly, no distention.  Skin: Intact without lesions or rashes.  Neurologic: Alert and oriented x 3.  Psych:  Normal affect. Extremities: No clubbing or cyanosis.  HEENT: Normal.   Assessment/Plan: 1. COPD: On home oxygen, 5L at rest and 6L with exertion.  PFTs in 10/16 actually showed only mild obstruction (improved).  Breathing has improved in the past with increased diuresis, suggesting that CHF plays a significant role in her symptoms in addition to COPD.  2. Atrial fibrillation: Paroxysmal, CHADSVASC = 4.  She remains in NSR today and has not felt atrial fibrillation recently.  She is mildly tachycardic, this has been chronic (suspected inappropriate sinus tachycardia).  - She will continue Xarelto.  - She can continue verapamil.     3. Chronic diastolic CHF: NYHA class III symptoms, likely due to combination of CHF and COPD.  Possible mild volume overload, symptomatically a bit worse recently.  - Increase Lasix to 120 qam/80 qpm x 3 days then back to 80 mg bid.  Increase KCl to 40 mEq bid x 3 days then back to 30 mEq bid.   4. ?Shunt lesion: She had transcranial dopplers in 2013 suggesting a medium-sized right to left shunt.  This led to an extensive workup.  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  Cardiac MRI was unremarkable, no evidence for shunt lesion.  RHC/LHC in 2013 showed no coronary disease but it did show restrictive hemodynamics and volume overload.  Qp/Qs from this study was 1.27/1, suggesting a relatively small left to right shunt.  A definite shunt lesion was never identified.  Based on prior workup, it seems most likely that the right to left shunting by transcranial dopplers and TEE bubble study was due to pulmonary AVMs.  RHC repeated in 2/17 did not show a significant shunt lesion, with Qp/Qs 0.97.  5. Inappropriate sinus tachycardia: HR in 100s today.  Had discussed using Corlanor in the past but interacts with verapamil.  6. Pulmonary hypertension: Mild pulmonary hypertension on RHC with PVR only 2.5 WU.  Probably due to COPD and elevated left atrial  pressure.  No specific treatment other than diuresis.   Followup in 6 wks.   Loralie Champagne 03/08/2016

## 2016-03-10 ENCOUNTER — Ambulatory Visit (INDEPENDENT_AMBULATORY_CARE_PROVIDER_SITE_OTHER): Payer: Medicare Other | Admitting: Pulmonary Disease

## 2016-03-10 ENCOUNTER — Encounter: Payer: Self-pay | Admitting: Pulmonary Disease

## 2016-03-10 DIAGNOSIS — J9611 Chronic respiratory failure with hypoxia: Secondary | ICD-10-CM

## 2016-03-10 DIAGNOSIS — I272 Pulmonary hypertension, unspecified: Secondary | ICD-10-CM

## 2016-03-10 DIAGNOSIS — J432 Centrilobular emphysema: Secondary | ICD-10-CM | POA: Diagnosis not present

## 2016-03-10 DIAGNOSIS — Z23 Encounter for immunization: Secondary | ICD-10-CM

## 2016-03-10 NOTE — Assessment & Plan Note (Signed)
She has gold grade D COPD with significant hypoxemia. She was recently hospitalized for a severe COPD exacerbation, but fortunately this has been the only such episode this year. I explained to her today that exacerbations can worsen her overall condition and so we need to do whatever we can to try to reduce the incidence of these.  Plan: Flu shot today Continue Symbicort and Spiriva If she has another exacerbation within the next 12 months then we will need to start either Daliresp or daily azithromycin, but considering the side effect profiles from these and the fact that she's only had one exacerbation this year were going to hold off for now. Follow-up 2 months

## 2016-03-10 NOTE — Assessment & Plan Note (Signed)
This is secondary to her severe COPD. No indication for pulmonary vasodilator. However, because of her pulmonary hypertension she does have significant edema, fortunately this has significantly improved after the adjustment to diuretic medicines by her cardiology team.  Plan: Continue Lasix as prescribed by cardiology

## 2016-03-10 NOTE — Assessment & Plan Note (Signed)
Continue 5 L of oxygen at rest, 6 L with exertion.

## 2016-03-10 NOTE — Progress Notes (Signed)
Subjective:    Patient ID: Destiny Shelton, female    DOB: [birth year], 70 y.o.   MRN: [id number]  Synopsys: Former patient of Dr. Gwenette Greet with pulmonary hypertension, Afib, and COPD.  She smoked 2 packs per day for 25 years and quit in the 1990's.  She has been on oxygen since 2012.  As of 2017 She has been using 5L O2 at rest and 6 L with exertion.   She does not have a history of PE that she is aware of.   pfts 2011:  FEV1 1.34 (55%), ratio 68, ++airtrapping on lung volumes, nl TLC, DLCO 40% pred. PFT's  03/23/2015  FEV1 1.61 (68 % ) ratio 69  p no % improvement from saba on   Symb/spiriva  Echo 2012: nl LV and EF, impaired relaxation, nothing to suggest pulm htn. Myoview 2012: no ischemia LHC 12/2011: non-obstructive cad RHC 12/2011:  PA 45/29 with mean 6mm, PCWP 22, CO Fick 10.25, PVR 1.17 wood units, slight step up in saturation from   RV to PA [place] 12/2011:  Equal 4 chamber pressures, but no square root sign.   Echo [id number]:  Nl LV, diastolic dysfxn, normal RV, estimated RVSP 46.  TEE 03/2012:  A few late bubbles seen felt c/w small IP shunting.  CT chest 12/2011:  No PE, very mild mosaic perfusion abnl (prob secondary to her known airflow obstruction).  Cardiac MRI 2013:  No infiltrative process.  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  07/04/2015 RHC: RA mean 9 RV 45/10 PA 41/20, mean 30 PCWP mean 13  Oxygen saturations: SVC 68% RA 65% RV 61% PA 69% LA 98% Peripheral sat 93%  Cardiac Output (Fick) 6.7  Cardiac Index (Fick) 3.3 PVR 2.5 WU Qp/Qs = 0.97    HPI Chief Complaint  Patient presents with  . Follow-up    pt recently hospitalized for COPD exacerbation.  pt c/o SOB with exertion, prod cough with green mucus.     [place] was hospitalized in September 2017 for a COPD exacerbation. She had an incident of hypoglycemia one Sunday morning and then she became confused.  She didn't really have any trouble breathing prior to this that she recalls.  She  said that she had a hard time breathing in the hospital however so she ended up with a diagnosis of a COPD exacerbation.    At home her breating has been OK.  She has been walking around some.  She says that she had some fluid build up and she was coughing more.    Her cardiologist recently increased her lasix for three days, which helped some.    She is still using 5L O2 at home. She notes that she had been using 4 prior to her hospitalization.    Past Medical History:  Diagnosis Date  . Adenomatous colon polyp   . ALLERGIC RHINITIS   . Anemia    iron deficient  . Anxiety   . Atrial tachycardia (HCC)    Mostly Sinus Tachycardia  . Back pain, chronic   . Carotid stenosis   . CHF (congestive heart failure) ([place])   . Community acquired pneumonia - Recent Admission (07/2013) 07/08/2013  . COPD (chronic obstructive pulmonary disease) ([place])    Requiring Home O2 at 4L  . Depression   . Diabetes mellitus type 2 with neurological manifestations ([place])    On Insulin  . Diverticulosis of colon   . External hemorrhoids   .  Fibromyalgia    "pain in arms and shoulders."  . Gastritis   . GERD (gastroesophageal reflux disease)   . Headache(784.0)    tension  . Hyperlipemia   . Hypertension   . Inappropriate sinus tachycardia   . Morbidly obese ([place])   . Nephrolithiasis   . Neuromuscular disorder ([place])    diabetic neuropathy  . Osteoarthritis   . Osteoporosis   . PAF (paroxysmal atrial fibrillation) ([place])   . 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  . Sleep apnea    no cpap machine  02 at 4l/min all the time  . Urosepsis 05/26/2012      Review of Systems  Constitutional: Negative for chills, diaphoresis, fatigue and fever.  HENT: Negative for postnasal drip, rhinorrhea and sinus pressure.   Respiratory: Positive for cough and shortness of breath. Negative for wheezing.   Cardiovascular: Negative for chest pain, palpitations and leg swelling.       Objective:   Physical Exam Vitals:    03/10/16 1124  BP: 124/62  Pulse: (!) 103  SpO2: 93%  Weight: 217 lb 12.8 oz (98.8 kg)  Height: 5\' 5"  (1.651 m)   5L [place] > walked about 150 feet in the office and O2 saturation   Gen: chronically ill appearing HENT: OP clear, TM's clear, neck supple PULM: few fine crackles bases B, normal percussion CV: RRR,slight systolic murmur LUSB, trace edema GI: BS+, soft, nontender Derm: no cyanosis or rash Psyche: normal mood and affect  Hospital records from September 2017 reviewed where she was treated for a COPD exacerbation. She was discharged on Symbicort, Spiriva, and a prednisone taper.  August 2017 CT chest images personally reviewed showing centrilobular emphysema, airway thickening bilaterally, there are nonspecific interstitial changes predominantly in the right middle lobe and some in the left upper lobe but no findings to suggest diffuse parenchymal lung disease.     Assessment & Plan:  Pulmonary hypertension ([place]) This is secondary to her severe COPD. No indication for pulmonary vasodilator. However, because of her pulmonary hypertension she does have significant edema, fortunately this has significantly improved after the adjustment to diuretic medicines by her cardiology team.  Plan: Continue Lasix as prescribed by cardiology  Centrilobular emphysema ([place]) She has gold grade D COPD with significant hypoxemia. She was recently hospitalized for a severe COPD exacerbation, but fortunately this has been the only such episode this year. I explained to her today that exacerbations can worsen her overall condition and so we need to do whatever we can to try to reduce the incidence of these.  Plan: Flu shot today Continue Symbicort and Spiriva If she has another exacerbation within the next 12 months then we will need to start either Daliresp or daily azithromycin, but considering the side effect profiles from these and the fact that she's only had one exacerbation this year were  going to hold off for now. Follow-up 2 months  Chronic respiratory failure (HCC) Continue 5 L of oxygen at rest, 6 L with exertion.    Current Outpatient Prescriptions:  .  acetaminophen (TYLENOL) 500 MG tablet, 500 mg every 6 (six) hours as needed for mild pain. Take per bottle as needed for pain, Disp: , Rfl:  .  albuterol (PROVENTIL) (2.5 MG/3ML) 0.083% nebulizer solution, Take 3 mLs (2.5 mg total) by nebulization every 6 (six) hours as needed for wheezing or shortness of breath., Disp: 75 mL, Rfl: 3 .  ALPRAZolam (XANAX) 0.5 MG tablet, Take 0.25 mg by mouth daily as needed for anxiety  or sleep. , Disp: , Rfl:  .  atorvastatin (LIPITOR) 40 MG tablet, Take 40 mg by mouth at bedtime. , Disp: , Rfl:  .  benzonatate (TESSALON) 200 MG capsule, Take 200 mg by mouth at bedtime as needed for cough. , Disp: , Rfl:  .  buPROPion (WELLBUTRIN XL) 150 MG 24 hr tablet, Take 150 mg by mouth every morning. , Disp: , Rfl:  .  DALIRESP 500 MCG TABS tablet, Take 500 mcg by mouth daily. , Disp: , Rfl: 6 .  DULoxetine (CYMBALTA) 60 MG capsule, Take 60 mg by mouth every morning. , Disp: , Rfl:  .  furosemide (LASIX) 40 MG tablet, Take 80 mg by mouth 2 (two) times daily., Disp: , Rfl:  .  gabapentin (NEURONTIN) 600 MG tablet, Take 600 mg by mouth 3 (three) times daily. , Disp: , Rfl:  .  HYDROcodone-acetaminophen (NORCO/VICODIN) 5-325 MG per tablet, Take 1 tablet by mouth every 4 (four) hours as needed for moderate pain. Reported on 12/12/2015, Disp: , Rfl:  .  insulin glargine (LANTUS) 100 UNIT/ML injection, Inject 0.66 mLs (66 Units total) into the skin daily before breakfast., Disp: , Rfl:  .  insulin lispro (HUMALOG) 100 UNIT/ML injection, Inject 8 Units into the skin 2 (two) times daily., Disp: , Rfl:  .  irbesartan (AVAPRO) 150 MG tablet, Take 1 tablet (150 mg total) by mouth daily. KEEP OV., Disp: 90 tablet, Rfl: 0 .  iron polysaccharides (NIFEREX) 150 MG capsule, Take 1 capsule (150 mg total) by mouth 2  (two) times daily., Disp: 60 capsule, Rfl: 0 .  levalbuterol (XOPENEX HFA) 45 MCG/ACT inhaler, Inhale 1-2 puffs into the lungs every 4 (four) hours as needed for wheezing., Disp: 1 Inhaler, Rfl: 12 .  Multiple Vitamin (MULTIVITAMIN) tablet, Take 1 tablet by mouth every morning. , Disp: , Rfl:  .  omeprazole (PRILOSEC) 20 MG capsule, Take 20 mg by mouth daily. , Disp: , Rfl:  .  ONE TOUCH ULTRA TEST test strip, 1 each by Other route 3 (three) times daily. , Disp: , Rfl: 4 .  OXYGEN, 4 L at baseline, Increase oxygen to 6L with acitivity, Disp: , Rfl:  .  polyethylene glycol (MIRALAX / GLYCOLAX) packet, Take 17 g by mouth daily., Disp: , Rfl:  .  potassium chloride (K-DUR,KLOR-CON) 10 MEQ tablet, Take 3 tablets (30 mEq total) by mouth 2 (two) times daily., Disp: 180 tablet, Rfl: 3 .  rivaroxaban (XARELTO) 20 MG TABS tablet, Take 1 tablet (20 mg total) by mouth daily with supper. Restart on Wednesday May 11 , 2016, Disp: 30 tablet, Rfl:  .  solifenacin (VESICARE) 5 MG tablet, Take 5 mg by mouth daily., Disp: , Rfl:  .  SYMBICORT 160-4.5 MCG/ACT inhaler, INHALE 2 PUFFS BY MOUTH TWICE DAILY, Disp: 10.2 g, Rfl: 5 .  tiotropium (SPIRIVA) 18 MCG inhalation capsule, Place 1 capsule (18 mcg total) into inhaler and inhale every morning., Disp: 30 capsule, Rfl: 11 .  verapamil (VERELAN PM) 360 MG 24 hr capsule, TAKE ONE CAPSULE BY MOUTH DAILY (Patient taking differently: TAKE 360 MG BY MOUTH DAILY), Disp: 30 capsule, Rfl: 4

## 2016-03-10 NOTE — Patient Instructions (Signed)
Keep using your Symbicort and Spiriva as you are doing Keep using your oxygen as you're doing Stay active as much as possible We will see you back in 2 months or sooner if needed

## 2016-04-18 ENCOUNTER — Ambulatory Visit (HOSPITAL_COMMUNITY)
Admission: RE | Admit: 2016-04-18 | Discharge: 2016-04-18 | Disposition: A | Discharge status: Home / Self Care | Payer: Medicare Other | Source: Ambulatory Visit | Attending: Cardiology | Admitting: Cardiology

## 2016-04-18 ENCOUNTER — Other Ambulatory Visit (HOSPITAL_COMMUNITY): Payer: Self-pay | Admitting: *Deleted

## 2016-04-18 VITALS — BP 128/62 | HR 126 | Wt 222.0 lb

## 2016-04-18 DIAGNOSIS — I272 Pulmonary hypertension, unspecified: Secondary | ICD-10-CM | POA: Diagnosis not present

## 2016-04-18 DIAGNOSIS — I10 Essential (primary) hypertension: Secondary | ICD-10-CM | POA: Diagnosis not present

## 2016-04-18 DIAGNOSIS — G4733 Obstructive sleep apnea (adult) (pediatric): Secondary | ICD-10-CM | POA: Diagnosis not present

## 2016-04-18 DIAGNOSIS — E119 Type 2 diabetes mellitus without complications: Secondary | ICD-10-CM | POA: Diagnosis not present

## 2016-04-18 DIAGNOSIS — Z7901 Long term (current) use of anticoagulants: Secondary | ICD-10-CM | POA: Diagnosis not present

## 2016-04-18 DIAGNOSIS — I110 Hypertensive heart disease with heart failure: Secondary | ICD-10-CM | POA: Diagnosis not present

## 2016-04-18 DIAGNOSIS — I5032 Chronic diastolic (congestive) heart failure: Secondary | ICD-10-CM | POA: Diagnosis not present

## 2016-04-18 DIAGNOSIS — Z6836 Body mass index (BMI) 36.0-36.9, adult: Secondary | ICD-10-CM | POA: Diagnosis not present

## 2016-04-18 DIAGNOSIS — Z79899 Other long term (current) drug therapy: Secondary | ICD-10-CM | POA: Diagnosis not present

## 2016-04-18 DIAGNOSIS — E669 Obesity, unspecified: Secondary | ICD-10-CM | POA: Insufficient documentation

## 2016-04-18 DIAGNOSIS — I480 Paroxysmal atrial fibrillation: Secondary | ICD-10-CM | POA: Diagnosis not present

## 2016-04-18 DIAGNOSIS — Z87891 Personal history of nicotine dependence: Secondary | ICD-10-CM | POA: Insufficient documentation

## 2016-04-18 DIAGNOSIS — J449 Chronic obstructive pulmonary disease, unspecified: Secondary | ICD-10-CM | POA: Insufficient documentation

## 2016-04-18 DIAGNOSIS — Z9981 Dependence on supplemental oxygen: Secondary | ICD-10-CM | POA: Diagnosis not present

## 2016-04-18 DIAGNOSIS — R0602 Shortness of breath: Secondary | ICD-10-CM

## 2016-04-18 DIAGNOSIS — Z794 Long term (current) use of insulin: Secondary | ICD-10-CM | POA: Insufficient documentation

## 2016-04-18 DIAGNOSIS — R000 Tachycardia, unspecified: Secondary | ICD-10-CM

## 2016-04-18 LAB — BASIC METABOLIC PANEL
Anion gap: 8 (ref 5–15)
BUN: 14 mg/dL (ref 6–20)
CALCIUM: 8.8 mg/dL — AB (ref 8.9–10.3)
CHLORIDE: 101 mmol/L (ref 101–111)
CO2: 29 mmol/L (ref 22–32)
CREATININE: 0.9 mg/dL (ref 0.44–1.00)
GFR calc non Af Amer: >60 mL/min (ref >60)
Glucose, Bld: 162 mg/dL — ABNORMAL HIGH (ref 65–99)
Potassium: 4.4 mmol/L (ref 3.5–5.1)
SODIUM: 138 mmol/L (ref 135–145)

## 2016-04-18 LAB — BRAIN NATRIURETIC PEPTIDE: B Natriuretic Peptide: 20.7 pg/mL (ref 0.0–100.0)

## 2016-04-18 MED ORDER — POTASSIUM CHLORIDE CRYS ER 10 MEQ PO TBCR: 30.0000 meq | EXTENDED_RELEASE_TABLET | Freq: Two times a day (BID) | ORAL | 3 refills | Status: DC

## 2016-04-18 NOTE — Patient Instructions (Addendum)
For the next THREE DAYS (Saturday, Sunday, and Monday)... INCREASE Lasix to 120 mg (3 tabs) in am and 80 mg (2 tabs) in pm. INCREASE Potassium to 40 meq (4 tabs) twice daily.  Starting Tuesday, reduce doses back to previously prescribed. Lasix 80 mg (2 tabs) twice daily. Potassium 30 meq (3 tabs) twice daily.  Routine lab work today. Will notify you of abnormal results, otherwise no news is good news!  Follow up 6 weeks with Dr. Aundra Dubin.  Do the following things EVERYDAY: 1) Weigh yourself in the morning before breakfast. Write it down and keep it in a log. 2) Take your medicines as prescribed 3) Eat low salt foods-Limit salt (sodium) to 2000 mg per day.  4) Stay as active as you can everyday 5) Limit all fluids for the day to less than 2 liters

## 2016-04-18 NOTE — Progress Notes (Signed)
Patient ID: Destiny Shelton, female   DOB: 12-10-1945, 70 y.o.   MRN: [id number]  PCP: Dr. Brigitte Pulse HF Cardiology: Dr. Aundra Dubin  70 yo with history of paroxysmal atrial fibrillation, chronic diastolic CHF with restrictive hemodynamics on 2013 RHC, COPD on home oxygen, inappropriate sinus tachycardia, and concern for intracardiac shunting presents for CHF clinic evaluation.  She has been followed for several years by both cardiology and pulmonology.  She is a prior smoker and has been diagnosed with COPD.  Interestingly, her PFTs in 10/16 showed improvement, suggesting mild obstructive airways disease.  However, she still requires home oxygen.  She had transcranial dopplers in 2013 suggesting a medium-sized right to left shunt.  This led to an extensive workup.  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  Cardiac MRI was unremarkable, no evidence for shunt lesion.  RHC/LHC showed no coronary disease but it did show restrictive hemodynamics and volume overload.  Qp/Qs from this study was 1.27/1, suggesting a relatively small left to right shunt.  A definite shunt lesion was never identified.    Echo in 2/17 showed normal LV size and systolic function and normal RV.  I also did a RHC in 2/17: on higher dose diuretics, she had mild pulmonary hypertension and normal PCWP.  There was no evidence for a shunt lesion with Qp/Qs 0.97.   She was admitted in 9/17 with acute exacerbation of COPD.  She was treated with steroids and nebs.   She presents today for regular follow up. Weight up 5 lbs from last visit (6 weeks).  Initially felt better, with weight down and improved breathing.  This week she is more SOB and weight is back up. She occasionally takes an extra 40-80 mg or lasix and that seems to help.   Weight at home 218 this am. Had been as low as 210 in the past and felt great. Drinks 3-4 16 oz cups of fluid a day + 3 cups of coffee a day. Had a shake and bake chicken last night.  Doesn't shake  on salt. Uses walker to get round.  Gets SOB when she bathes or changes clothes, also when she walks if she hurries. No chest pain. Chronic orthopnea, sleeps in recliner. No BRBPR or melena.     Labs (9/16): digoxin 0.6, K 4.3, creatinine 0.85 Labs (1/17): K 3.9, creatinine 0.96, hgb 8.9 Labs (3/17): K 4.2, creatinine 0.96 Labs (6/17): K 4.2, creatinine 1.03 Labs (8/17): K 4.1, creatinine 0.81, HCT 34.3 Labs (9/17): K 3.7, creatinine 0.91, hgb 12.1  PMH: 1. Atrial fibrillation: Paroxysmal.  2. HTN 3. COPD: Home oxygen.  PFTs (10/16) with FVC 77%, FEV1 68%, TLC 89% => mild obstruction (improved from the past).   4. Type II diabetes 5. Obesity 6. Inappropriate sinus tachycardia 7. OSA: Does not use CPAP, uses oxygen at night.  8. Chronic diastolic CHF: RHC/LHC in 0000000 with no significant CAD, mean RA 19, RV 46/18, PA 45/29 mean 37, mean PCWP 22, CI 4.1, PVR 1.4 WU => restrictive hemodynamics with no evidence for constriction;  Qp/Qs 1.27/1; saturation run difficult to interpret.  TEE (10/13): EF 55-60%, LVH, mild MR, bubble study showed no evidence for immediate R=>L bubble crossing, small amount of bubbles ended up in left heart > 5 seconds after injection suggesting possible intrapulmonary shunting; no shunt by color doppler.  Cardiac MRI (8/13) with EF 59%, no LGE, normal RV size/systolic function => interpreted as normal.  RHC 07/04/2015: RA mean 9, RV 45/10, PA  41/20 mean 30, PCWP mean 13, no step up on shunt run,cardiac output (Fick) 6.7 cardiac index (Fick) 3.3, PVR 2.5 WU, Qp/Qs = 0.97. Echo (2/17) with EF 55-60%, mild LVH, mild MR, normal RV size and systolic function.  9. ?Intracardiac shunt: Transcranial dopplers in 10/13 were suggestive of medium-sized right to left shunt.  Bubble study on TEE in 10/13 showed no evidence for immediate R=>L bubble crossing, small amount of bubbles ended up in left heart > 5 seconds after injection suggesting possible intrapulmonary shunting; no shunt by  color doppler. Cardiac MRI showed no evidence for shunting.  Cardiac cath in 2013 actually was suggestive of a small left to right shunt.  Cardiac cath in 2/17 NOT suggestive of significant shunt, Qp/Qs 0.97.   SH: Married, prior heavy smoker (quit 1996).  No ETOH.   Family History  Problem Relation Age of Onset  . Heart disease Mother   . Stroke Mother   . Heart disease Father   . Heart disease Brother   . Stroke Brother   . Skin cancer Brother   . Colon polyps Sister   . Diabetes Sister   . Diabetes Brother   . Irritable bowel syndrome Daughter   . Colon cancer Neg Hx    ROS: All systems reviewed and negative except as per HPI.   Current Outpatient Prescriptions  Medication Sig Dispense Refill  . acetaminophen (TYLENOL) 500 MG tablet 500 mg every 6 (six) hours as needed for mild pain. Take per bottle as needed for pain    . albuterol (PROVENTIL) (2.5 MG/3ML) 0.083% nebulizer solution Take 3 mLs (2.5 mg total) by nebulization every 6 (six) hours as needed for wheezing or shortness of breath. 75 mL 3  . ALPRAZolam (XANAX) 0.5 MG tablet Take 0.25 mg by mouth daily as needed for anxiety or sleep.     Marland Kitchen atorvastatin (LIPITOR) 40 MG tablet Take 40 mg by mouth at bedtime.     . benzonatate (TESSALON) 200 MG capsule Take 200 mg by mouth at bedtime as needed for cough.     Marland Kitchen buPROPion (WELLBUTRIN XL) 150 MG 24 hr tablet Take 150 mg by mouth every morning.     Marland Kitchen DALIRESP 500 MCG TABS tablet Take 500 mcg by mouth daily.   6  . DULoxetine (CYMBALTA) 60 MG capsule Take 60 mg by mouth every morning.     . furosemide (LASIX) 40 MG tablet Take 80 mg by mouth 2 (two) times daily.    Marland Kitchen gabapentin (NEURONTIN) 600 MG tablet Take 600 mg by mouth 3 (three) times daily.     Marland Kitchen HYDROcodone-acetaminophen (NORCO/VICODIN) 5-325 MG per tablet Take 1 tablet by mouth every 4 (four) hours as needed for moderate pain. Reported on 12/12/2015    . insulin glargine (LANTUS) 100 UNIT/ML injection Inject 0.66 mLs (66  Units total) into the skin daily before breakfast.    . insulin lispro (HUMALOG) 100 UNIT/ML injection Inject 8 Units into the skin 2 (two) times daily.    . irbesartan (AVAPRO) 150 MG tablet Take 1 tablet (150 mg total) by mouth daily. KEEP OV. 90 tablet 0  . iron polysaccharides (NIFEREX) 150 MG capsule Take 1 capsule (150 mg total) by mouth 2 (two) times daily. 60 capsule 0  . levalbuterol (XOPENEX HFA) 45 MCG/ACT inhaler Inhale 1-2 puffs into the lungs every 4 (four) hours as needed for wheezing. 1 Inhaler 12  . Multiple Vitamin (MULTIVITAMIN) tablet Take 1 tablet by mouth every morning.     Marland Kitchen  omeprazole (PRILOSEC) 20 MG capsule Take 20 mg by mouth daily.     . ONE TOUCH ULTRA TEST test strip 1 each by Other route 3 (three) times daily.   4  . OXYGEN 4 L at baseline, Increase oxygen to 6L with acitivity    . polyethylene glycol (MIRALAX / GLYCOLAX) packet Take 17 g by mouth daily.    . potassium chloride (K-DUR,KLOR-CON) 10 MEQ tablet Take 3 tablets (30 mEq total) by mouth 2 (two) times daily. 180 tablet 3  . rivaroxaban (XARELTO) 20 MG TABS tablet Take 1 tablet (20 mg total) by mouth daily with supper. Restart on Wednesday May 11 , 2016 30 tablet   . solifenacin (VESICARE) 5 MG tablet Take 5 mg by mouth daily.    . SYMBICORT 160-4.5 MCG/ACT inhaler INHALE 2 PUFFS BY MOUTH TWICE DAILY 10.2 g 5  . tiotropium (SPIRIVA) 18 MCG inhalation capsule Place 1 capsule (18 mcg total) into inhaler and inhale every morning. 30 capsule 11  . verapamil (VERELAN PM) 360 MG 24 hr capsule Take 360 mg by mouth at bedtime.     No current facility-administered medications for this encounter.    BP 128/62 (BP Location: Left Arm, Patient Position: Sitting, Cuff Size: Normal)   Pulse (!) 126   Wt 222 lb (100.7 kg)   SpO2 90%   BMI 36.94 kg/m    Wt Readings from Last 3 Encounters:  04/18/16 222 lb (100.7 kg)  03/10/16 217 lb 12.8 oz (98.8 kg)  03/07/16 216 lb 12.8 oz (98.3 kg)   General: NAD, obese,  wearing oxygen.  Neck: JVP 8-9 cm, no thyromegaly or thyroid nodule.  Lungs: Mildly diminished basilar sounds with scant crackles. CV: Nondisplaced PMI.  Heart mildly tachy, regular S1/S2, no S3/S4, 2/6 HSM LLSB.  Right ankle 2+ edema, left ankle trace edema.  No carotid bruit.  Normal pedal pulses.  Abdomen: Soft, NT, ND, no HSM. No bruits or masses. +BS  Skin: Intact without lesions or rashes.  Neurologic: Alert and oriented x 3.  Psych: Normal affect. Extremities: No clubbing or cyanosis.  HEENT: Normal.   EKG: Sinus Tach 113 bpm  Assessment/Plan: 1. COPD: On home oxygen, 5L at rest and 6L with exertion.  PFTs in 10/16 actually showed only mild obstruction (improved).  Breathing has improved in the past with increased diuresis, suggesting that CHF plays a significant role in her symptoms in addition to COPD.  2. Atrial fibrillation: Paroxysmal, CHADSVASC = 4.  She remains in NSR today and has not felt atrial fibrillation recently.   - Remains mildly tachycardic chronically.   - She will continue Xarelto.  - She can continue verapamil.     3. Chronic diastolic CHF: NYHA class III symptoms, likely due to combination of CHF and COPD.  Possible mild volume overload, symptomatically a bit worse recently.  - Will have her take extra 40 mg of lasix the next 3 days with extra 20 meq of potassium each day total. - Otherwise, continue chronic dosing of Lasix 80 mg BID and potassium 30 meq BID.  4. ?Shunt lesion: She had transcranial dopplers in 2013 suggesting a medium-sized right to left shunt.  This led to an extensive workup.  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  Cardiac MRI was unremarkable, no evidence for shunt lesion.  RHC/LHC in 2013 showed no coronary disease but it did show restrictive hemodynamics and volume overload.  Qp/Qs from this study was 1.27/1, suggesting a relatively  small left to right shunt.  A definite shunt lesion was never identified.  Based  on prior workup, it seems most likely that the right to left shunting by transcranial dopplers and TEE bubble study was due to pulmonary AVMs.  RHC repeated in 2/17 did not show a significant shunt lesion, with Qp/Qs 0.97.  5. Inappropriate sinus tachycardia: - HR in 100-110s today. Have discussed using Corlanor in the past, but interaction with verapamil prohibits.  6. Pulmonary hypertension: Mild pulmonary hypertension on RHC with PVR only 2.5 WU.  Probably due to COPD and elevated left atrial pressure.  No specific treatment other than diuresis.   Follow up with MD in 6 weeks. BMET/BNP today.   Shirley Friar, PA-C 04/18/2016   Total time spent > 25 minutes. Over half that time spent discussing the above.

## 2016-04-18 NOTE — Progress Notes (Signed)
Advanced Heart Failure Medication Review by a Pharmacist  Does the patient  feel that his/her medications are working for him/her?  yes  Has the patient been experiencing any side effects to the medications prescribed?  no  Does the patient measure his/her own blood pressure or blood glucose at home?  yes   Does the patient have any problems obtaining medications due to transportation or finances?   no  Understanding of regimen: good Understanding of indications: good Potential of compliance: good Patient understands to avoid NSAIDs. Patient understands to avoid decongestants.  Issues to address at subsequent visits: None   Pharmacist comments:  Destiny Shelton is a pleasant 70 yo F presenting with her husband and without a medication list. She reports good compliance with her regimen and did not have any specific medication-related questions or concerns for me at this time.   Ruta Hinds. Velva Harman, PharmD, BCPS, CPP Clinical Pharmacist Pager: [phone] Phone: [phone] 04/18/2016 9:59 AM      Time with patient: 10 minutes Preparation and documentation time: 2 minutes Total time: 12 minutes

## 2016-04-29 ENCOUNTER — Encounter: Payer: Self-pay | Admitting: Internal Medicine

## 2016-04-29 ENCOUNTER — Ambulatory Visit (INDEPENDENT_AMBULATORY_CARE_PROVIDER_SITE_OTHER): Payer: Medicare Other | Admitting: Internal Medicine

## 2016-04-29 VITALS — BP 122/64 | HR 97 | Ht 65.0 in | Wt 223.4 lb

## 2016-04-29 DIAGNOSIS — D125 Benign neoplasm of sigmoid colon: Secondary | ICD-10-CM | POA: Diagnosis not present

## 2016-04-29 DIAGNOSIS — D649 Anemia, unspecified: Secondary | ICD-10-CM

## 2016-04-29 NOTE — Patient Instructions (Signed)
   I hope things stay ok for you. We decided not to pursue anymore colonoscopy so I will be available if needed.  I appreciate the opportunity to care for you. Gatha Mayer, MD, [facility]

## 2016-04-29 NOTE — Progress Notes (Signed)
   Destiny Shelton 70 y.o. 12-Jun-1945 [id number]  Assessment & Plan:   1. Benign neoplasm of sigmoid colon   2. Chronic anemia    This is in the setting of severe cardiopulmonary comorbidities including emphysema, cold stage D, on 5 L oxygen, recent exacerbation this fall and hospitalization. 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  With her worsening lung status over time, I'll think it makes sense to pursue a colonoscopy as her ability to tolerate problems associated with the procedure is limited. She and her husband agree. If she develops particular problems, we could investigate on an as-needed basis perhaps. Fortunately she is not anemic at this time and does not show signs of overt bleeding.  She will follow-up with Dr. Brigitte Pulse and I will see her back as needed.  I appreciate the opportunity to care for this patient. CC: Marton Redwood, MD   Subjective:   Chief Complaint: Follow-up of anemia and colon polyp  HPI Areas here with her husband today, she says she is stable at this time. She uses a walker to ambulate and she has 5 L nasal cannula oxygen therapy at all times. In September she was hospitalized with a COPD flare. She continues to follow with pulmonary and heart failure clinics. She sees primary care, Dr. Brigitte Pulse tomorrow. She remains on Xarelto. No visible rectal bleeding MiraLAX treats constipation 1 or 2 doses a day, no problems. Her last colonoscopy was in May 2016, and I removed and ablated residual sigmoid polyp that came back as adenoma and there were just a couple of other small adenomas seen. Medications, allergies, past medical history, past surgical history, family history and social history are reviewed and updated in the EMR.  Review of Systems As per history of present illness  Objective:   Physical Exam BP 122/64 (BP Location: Right Arm, Patient Position: Sitting, Cuff Size: Large)   Pulse 97   Ht 5\' 5"  (1.651 m)   Wt 223 lb 6.4 oz (101.3 kg)    SpO2 91%   BMI 37.18 kg/m  Chronically ill, using  nasal cannula oxygen in no acute distress

## 2016-04-30 DIAGNOSIS — E784 Other hyperlipidemia: Secondary | ICD-10-CM | POA: Diagnosis not present

## 2016-04-30 DIAGNOSIS — E1129 Type 2 diabetes mellitus with other diabetic kidney complication: Secondary | ICD-10-CM | POA: Diagnosis not present

## 2016-04-30 DIAGNOSIS — E1149 Type 2 diabetes mellitus with other diabetic neurological complication: Secondary | ICD-10-CM | POA: Diagnosis not present

## 2016-04-30 DIAGNOSIS — D6489 Other specified anemias: Secondary | ICD-10-CM | POA: Diagnosis not present

## 2016-04-30 DIAGNOSIS — I509 Heart failure, unspecified: Secondary | ICD-10-CM | POA: Diagnosis not present

## 2016-04-30 DIAGNOSIS — J9611 Chronic respiratory failure with hypoxia: Secondary | ICD-10-CM | POA: Diagnosis not present

## 2016-04-30 DIAGNOSIS — J449 Chronic obstructive pulmonary disease, unspecified: Secondary | ICD-10-CM | POA: Diagnosis not present

## 2016-04-30 DIAGNOSIS — I10 Essential (primary) hypertension: Secondary | ICD-10-CM | POA: Diagnosis not present

## 2016-04-30 DIAGNOSIS — Z6835 Body mass index (BMI) 35.0-35.9, adult: Secondary | ICD-10-CM | POA: Diagnosis not present

## 2016-05-09 ENCOUNTER — Other Ambulatory Visit: Payer: Self-pay | Admitting: Cardiology

## 2016-05-09 NOTE — Telephone Encounter (Signed)
Rx(s) sent to pharmacy electronically.  

## 2016-05-13 ENCOUNTER — Other Ambulatory Visit: Payer: Self-pay | Admitting: Cardiology

## 2016-05-13 ENCOUNTER — Other Ambulatory Visit: Payer: Self-pay | Admitting: Pulmonary Disease

## 2016-05-13 NOTE — Telephone Encounter (Signed)
REFILL 

## 2016-05-14 DIAGNOSIS — H18413 Arcus senilis, bilateral: Secondary | ICD-10-CM | POA: Diagnosis not present

## 2016-05-14 DIAGNOSIS — H11153 Pinguecula, bilateral: Secondary | ICD-10-CM | POA: Diagnosis not present

## 2016-05-14 DIAGNOSIS — H40023 Open angle with borderline findings, high risk, bilateral: Secondary | ICD-10-CM | POA: Diagnosis not present

## 2016-05-14 DIAGNOSIS — Z794 Long term (current) use of insulin: Secondary | ICD-10-CM | POA: Diagnosis not present

## 2016-05-14 DIAGNOSIS — H11423 Conjunctival edema, bilateral: Secondary | ICD-10-CM | POA: Diagnosis not present

## 2016-05-14 DIAGNOSIS — Z9849 Cataract extraction status, unspecified eye: Secondary | ICD-10-CM | POA: Diagnosis not present

## 2016-05-14 DIAGNOSIS — E109 Type 1 diabetes mellitus without complications: Secondary | ICD-10-CM | POA: Diagnosis not present

## 2016-05-14 DIAGNOSIS — H5340 Unspecified visual field defects: Secondary | ICD-10-CM | POA: Diagnosis not present

## 2016-05-14 DIAGNOSIS — Z7984 Long term (current) use of oral hypoglycemic drugs: Secondary | ICD-10-CM | POA: Diagnosis not present

## 2016-05-14 DIAGNOSIS — Z961 Presence of intraocular lens: Secondary | ICD-10-CM | POA: Diagnosis not present

## 2016-05-19 ENCOUNTER — Encounter: Payer: Self-pay | Admitting: Pulmonary Disease

## 2016-05-19 ENCOUNTER — Other Ambulatory Visit (INDEPENDENT_AMBULATORY_CARE_PROVIDER_SITE_OTHER): Payer: Medicare Other

## 2016-05-19 ENCOUNTER — Ambulatory Visit (INDEPENDENT_AMBULATORY_CARE_PROVIDER_SITE_OTHER): Payer: Medicare Other | Admitting: Pulmonary Disease

## 2016-05-19 VITALS — BP 116/62 | HR 117 | Temp 97.9°F | Ht 65.0 in | Wt 222.0 lb

## 2016-05-19 DIAGNOSIS — R918 Other nonspecific abnormal finding of lung field: Secondary | ICD-10-CM

## 2016-05-19 DIAGNOSIS — I272 Pulmonary hypertension, unspecified: Secondary | ICD-10-CM | POA: Diagnosis not present

## 2016-05-19 DIAGNOSIS — J9611 Chronic respiratory failure with hypoxia: Secondary | ICD-10-CM

## 2016-05-19 DIAGNOSIS — R0602 Shortness of breath: Secondary | ICD-10-CM | POA: Diagnosis not present

## 2016-05-19 LAB — BASIC METABOLIC PANEL
BUN: 26 mg/dL — AB (ref 6–23)
CHLORIDE: 98 meq/L (ref 96–112)
CO2: 33 mEq/L — ABNORMAL HIGH (ref 19–32)
Calcium: 9.3 mg/dL (ref 8.4–10.5)
Creatinine, Ser: 1.31 mg/dL — ABNORMAL HIGH (ref 0.40–1.20)
GFR: 42.48 mL/min — AB (ref >60.00)
GLUCOSE: 126 mg/dL — AB (ref 70–99)
POTASSIUM: 4.5 meq/L (ref 3.5–5.1)
SODIUM: 140 meq/L (ref 135–145)

## 2016-05-19 MED ORDER — DOXYCYCLINE HYCLATE 100 MG PO TABS: 100.0000 mg | ORAL_TABLET | Freq: Two times a day (BID) | ORAL | 0 refills | Status: DC

## 2016-05-19 MED ORDER — METOLAZONE 5 MG PO TABS: 5.0000 mg | ORAL_TABLET | Freq: Every day | ORAL | 0 refills | Status: DC

## 2016-05-19 MED ORDER — PREDNISONE 20 MG PO TABS: 20.0000 mg | ORAL_TABLET | Freq: Every day | ORAL | 0 refills | Status: DC

## 2016-05-19 NOTE — Progress Notes (Signed)
Subjective:    Patient ID: Destiny Shelton, female    DOB: February 06, 1946, 70 y.o.   MRN: [id number]  Synopsys: Former patient of Dr. Gwenette Greet with pulmonary hypertension, Afib, and COPD.  She smoked 2 packs per day for 25 years and quit in the 1990's.  She has been on oxygen since 2012.  As of 2017 She has been using 5L O2 at rest and 6 L with exertion.   She does not have a history of PE that she is aware of.   pfts 2011:  FEV1 1.34 (55%), ratio 68, ++airtrapping on lung volumes, nl TLC, DLCO 40% pred. PFT's  03/23/2015  FEV1 1.61 (68 % ) ratio 69  p no % improvement from saba on   Symb/spiriva  Echo 2012: nl LV and EF, impaired relaxation, nothing to suggest pulm htn. Myoview 2012: no ischemia LHC 12/2011: non-obstructive cad RHC 12/2011:  PA 45/29 with mean 62mm, PCWP 22, CO Fick 10.25, PVR 1.17 wood units, slight step up in saturation from   RV to PA [place] 12/2011:  Equal 4 chamber pressures, but no square root sign.   Echo [id number]:  Nl LV, diastolic dysfxn, normal RV, estimated RVSP 46.  TEE 03/2012:  A few late bubbles seen felt c/w small IP shunting.  CT chest 12/2011:  No PE, very mild mosaic perfusion abnl (prob secondary to her known airflow obstruction).  Cardiac MRI 2013:  No infiltrative process.  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  07/04/2015 RHC: RA mean 9 RV 45/10 PA 41/20, mean 30 PCWP mean 13  Oxygen saturations: SVC 68% RA 65% RV 61% PA 69% LA 98% Peripheral sat 93%  Cardiac Output (Fick) 6.7  Cardiac Index (Fick) 3.3 PVR 2.5 WU Qp/Qs = 0.97    HPI Chief Complaint  Patient presents with  . Follow-up    pt c/o increased fatigue, weakness, sinus congestion, chest congestion, prod cough with light green mucus X1 week.    Destiny Shelton says that she has not been doing well.  She says that her breathing is worse when she exerts herself.  Just trying to get dressed will make her quite dypneic to the point of needing to rest for 15 minutes afterwards.   This has been worse over the last few days as well.  She says that she couldn't even get dressed today without getting dyspneic.    She has been coughing more lately.  Some mucus congestion in her chest.  The mucus she produces is yellow to green.  She is still taking her symbicort and Spiriva routinely.  She has been using her rescue inhaler more in the last few days, this helps.    She has an appointment with Cardiology on 12/28.  Her weight has been up and down. Her weight was 218 at home today, normal for her is 216-220.  She says her weight was down to 210 a few months ago.  She says that the lasix makes her go urinate right away.   No sick contacts lately.    Past Medical History:  Diagnosis Date  . Adenomatous colon polyp   . ALLERGIC RHINITIS   . Anemia    iron deficient  . Anxiety   . Atrial tachycardia (HCC)    Mostly Sinus Tachycardia  . Back pain, chronic   . Carotid stenosis   . CHF (congestive heart failure) ([place])   . Community acquired pneumonia - Recent Admission (07/2013) 07/08/2013  .  COPD (chronic obstructive pulmonary disease) ([place])    Requiring Home O2 at 4L  . Depression   . Diabetes mellitus type 2 with neurological manifestations ([place])    On Insulin  . Diverticulosis of colon   . External hemorrhoids   . Fibromyalgia    "pain in arms and shoulders."  . Gastritis   . GERD (gastroesophageal reflux disease)   . Headache(784.0)    tension  . Hyperlipemia   . Hypertension   . Inappropriate sinus tachycardia   . Morbidly obese ([place])   . Nephrolithiasis   . Neuromuscular disorder ([place])    diabetic neuropathy  . Osteoarthritis   . Osteoporosis   . PAF (paroxysmal atrial fibrillation) ([place])   . 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  . Sleep apnea    no cpap machine  02 at 4l/min all the time  . Urosepsis 05/26/2012      Review of Systems  Constitutional: Negative for chills, diaphoresis, fatigue and fever.  HENT: Negative for postnasal drip, rhinorrhea and sinus  pressure.   Respiratory: Positive for cough and shortness of breath. Negative for wheezing.   Cardiovascular: Negative for chest pain, palpitations and leg swelling.       Objective:   Physical Exam Vitals:   05/19/16 1002  BP: 116/62  Pulse: (!) 117  Temp: 97.9 F (36.6 C)  TempSrc: Oral  SpO2: 90%  Weight: 222 lb (100.7 kg)  Height: 5\' 5"  (1.651 m)   5L [place]   Gen: chronically ill appearing HENT: OP clear, TM's clear, neck supple PULM: few fine crackles bases B, normal percussion, no wheezing CV: RRR,slight systolic murmur LUSB, trace edema GI: BS+, soft, nontender Derm: no cyanosis or rash Psyche: normal mood and affect  Images from her CT chest from 2017 personally reviewed showing no pulmonary embolism but significant airway thickening and emphysema bilaterally with questionable fibrotic scarring in the lingula and right middle lobe bilaterally, nonspecific groundglass changes in the bases  BMET    Component Value Date/Time   NA 138 04/18/2016 1026   K 4.4 04/18/2016 1026   CL 101 04/18/2016 1026   CO2 29 04/18/2016 1026   GLUCOSE 162 (H) 04/18/2016 1026   BUN 14 04/18/2016 1026   CREATININE 0.90 04/18/2016 1026   CREATININE 0.85 02/12/2015 0949   CALCIUM 8.8 (L) 04/18/2016 1026   GFRNONAA >60 04/18/2016 1026   GFRAA >60 04/18/2016 1026        Assessment & Plan:  Impression: COPD exacerbation CHF Chronic respiratory failure with hypoxemia  Discussion: Ammy continues to struggle with her severe respiratory failure with hypoxemia. She's having another exacerbation of COPD that in addition to that her weight is up by about 10 pounds compared to wear was earlier this year and her physical exam is consistent with volume overload. So I believe she's having exacerbation of both her COPD and CHF right now.  We will treat her with prednisone and adjust her diuretics but I advised her today that she may need to go to the hospital if things don't improve.  Further,  I continue to remain a little bit confused about the severity of her chronic hypoxemia and by my personal review of the CT scan of her chest from this year I worry about the possibility of concomitant interstitial lung disease.  For your COPD exacerbation: Take the prednisone 20mg  daily x5 days Take the doxycycline 100 mg twice a day 5 days Keep taking your Symbicort and Spiriva Using her albuterol as needed for chest  tightness and shortness of breath  For your congestive heart failure: Keep taking furosemide 80 mg twice a day We will check a basic metabolic panel to check kidney function We will check a pro BNP to check air-fluid level Take metolazone 5 mg daily 30 minutes prior to the morning dose of furosemide for 3 days  Follow-up with cardiology  For your chronic respiratory failure with hypoxemia: Keep using her oxygen as you're doing We will arrange for a high-resolution CT scan of your chest in January to see if there is another pulmonary process such as pulmonary fibrosis or scarring happening  We will see you back in January with either myself or one of the nurse practitioners  Current Outpatient Prescriptions:  .  acetaminophen (TYLENOL) 500 MG tablet, 500 mg every 6 (six) hours as needed for mild pain. Take per bottle as needed for pain, Disp: , Rfl:  .  albuterol (PROVENTIL) (2.5 MG/3ML) 0.083% nebulizer solution, Take 3 mLs (2.5 mg total) by nebulization every 6 (six) hours as needed for wheezing or shortness of breath., Disp: 75 mL, Rfl: 3 .  ALPRAZolam (XANAX) 0.5 MG tablet, Take 0.25 mg by mouth daily as needed for anxiety or sleep. , Disp: , Rfl:  .  atorvastatin (LIPITOR) 40 MG tablet, Take 40 mg by mouth at bedtime. , Disp: , Rfl:  .  benzonatate (TESSALON) 200 MG capsule, Take 200 mg by mouth at bedtime as needed for cough. , Disp: , Rfl:  .  buPROPion (WELLBUTRIN XL) 150 MG 24 hr tablet, Take 150 mg by mouth every morning. , Disp: , Rfl:  .  DALIRESP 500 MCG TABS  tablet, Take 500 mcg by mouth daily. , Disp: , Rfl: 6 .  DULoxetine (CYMBALTA) 60 MG capsule, Take 60 mg by mouth every morning. , Disp: , Rfl:  .  furosemide (LASIX) 40 MG tablet, Take 80 mg by mouth 2 (two) times daily., Disp: , Rfl:  .  gabapentin (NEURONTIN) 600 MG tablet, Take 600 mg by mouth 3 (three) times daily. , Disp: , Rfl:  .  HYDROcodone-acetaminophen (NORCO/VICODIN) 5-325 MG per tablet, Take 1 tablet by mouth every 4 (four) hours as needed for moderate pain. Reported on 12/12/2015, Disp: , Rfl:  .  insulin glargine (LANTUS) 100 UNIT/ML injection, Inject 0.66 mLs (66 Units total) into the skin daily before breakfast., Disp: , Rfl:  .  insulin lispro (HUMALOG) 100 UNIT/ML injection, Inject 8 Units into the skin 2 (two) times daily., Disp: , Rfl:  .  irbesartan (AVAPRO) 150 MG tablet, Take 1 tablet (150 mg total) by mouth daily. PLEASE CONTACT OFFICE FOR ADDITIONAL REFILLS, Disp: 15 tablet, Rfl: 0 .  iron polysaccharides (NIFEREX) 150 MG capsule, Take 1 capsule (150 mg total) by mouth 2 (two) times daily., Disp: 60 capsule, Rfl: 0 .  levalbuterol (XOPENEX HFA) 45 MCG/ACT inhaler, Inhale 1-2 puffs into the lungs every 4 (four) hours as needed for wheezing., Disp: 1 Inhaler, Rfl: 12 .  Multiple Vitamin (MULTIVITAMIN) tablet, Take 1 tablet by mouth every morning. , Disp: , Rfl:  .  omeprazole (PRILOSEC) 20 MG capsule, Take 20 mg by mouth daily. , Disp: , Rfl:  .  ONE TOUCH ULTRA TEST test strip, 1 each by Other route 3 (three) times daily. , Disp: , Rfl: 4 .  OXYGEN, 5 L at baseline, Increase oxygen to 6L with acitivity, Disp: , Rfl:  .  polyethylene glycol (MIRALAX / GLYCOLAX) packet, Take 17 g by mouth daily., Disp: ,  Rfl:  .  potassium chloride (K-DUR,KLOR-CON) 10 MEQ tablet, Take 3 tablets (30 mEq total) by mouth 2 (two) times daily., Disp: 180 tablet, Rfl: 3 .  rivaroxaban (XARELTO) 20 MG TABS tablet, Take 1 tablet (20 mg total) by mouth daily with supper. Restart on Wednesday May 11 ,  2016, Disp: 30 tablet, Rfl:  .  solifenacin (VESICARE) 5 MG tablet, Take 5 mg by mouth daily., Disp: , Rfl:  .  SYMBICORT 160-4.5 MCG/ACT inhaler, INHALE 2 PUFFS BY MOUTH TWICE DAILY, Disp: 10.2 g, Rfl: 5 .  tiotropium (SPIRIVA) 18 MCG inhalation capsule, Place 1 capsule (18 mcg total) into inhaler and inhale every morning., Disp: 30 capsule, Rfl: 11 .  verapamil (VERELAN PM) 360 MG 24 hr capsule, Take 1 capsule (360 mg total) by mouth daily. KEEP OV., Disp: 90 capsule, Rfl: 0

## 2016-05-19 NOTE — Patient Instructions (Signed)
For your COPD exacerbation: Take the prednisone 20mg  daily x5 days Take the doxycycline 100 mg twice a day 5 days Keep taking your Symbicort and Spiriva Using her albuterol as needed for chest tightness and shortness of breath  For your congestive heart failure: Keep taking furosemide 80 mg twice a day We will check a basic metabolic panel to check kidney function We will check a pro BNP to check air-fluid level Take metolazone 5 mg daily 30 minutes prior to the morning dose of furosemide for 3 days  Follow-up with cardiology  For your chronic respiratory failure with hypoxemia: Keep using her oxygen as you're doing We will arrange for a high-resolution CT scan of your chest in January to see if there is another pulmonary process such as pulmonary fibrosis or scarring happening  We will see you back in January with either myself or one of the nurse practitioners

## 2016-05-20 LAB — PRO B NATRIURETIC PEPTIDE: NT-Pro BNP: 47 pg/mL (ref 0–301)

## 2016-05-22 ENCOUNTER — Other Ambulatory Visit: Payer: Self-pay

## 2016-05-25 ENCOUNTER — Inpatient Hospital Stay (HOSPITAL_COMMUNITY)
Admission: EM | Admit: 2016-05-25 | Discharge: 2016-05-28 | DRG: 191 | Disposition: A | Discharge status: Home Health | Payer: Medicare Other | Attending: Family Medicine | Admitting: Family Medicine

## 2016-05-25 ENCOUNTER — Emergency Department (HOSPITAL_COMMUNITY): Payer: Medicare Other

## 2016-05-25 ENCOUNTER — Encounter (HOSPITAL_COMMUNITY): Payer: Self-pay | Admitting: Family Medicine

## 2016-05-25 DIAGNOSIS — E876 Hypokalemia: Secondary | ICD-10-CM | POA: Diagnosis present

## 2016-05-25 DIAGNOSIS — Z86010 Personal history of colonic polyps: Secondary | ICD-10-CM

## 2016-05-25 DIAGNOSIS — Z96651 Presence of right artificial knee joint: Secondary | ICD-10-CM | POA: Diagnosis present

## 2016-05-25 DIAGNOSIS — M810 Age-related osteoporosis without current pathological fracture: Secondary | ICD-10-CM | POA: Diagnosis present

## 2016-05-25 DIAGNOSIS — N179 Acute kidney failure, unspecified: Secondary | ICD-10-CM | POA: Diagnosis present

## 2016-05-25 DIAGNOSIS — Z9981 Dependence on supplemental oxygen: Secondary | ICD-10-CM | POA: Diagnosis not present

## 2016-05-25 DIAGNOSIS — E871 Hypo-osmolality and hyponatremia: Secondary | ICD-10-CM | POA: Diagnosis not present

## 2016-05-25 DIAGNOSIS — Z8249 Family history of ischemic heart disease and other diseases of the circulatory system: Secondary | ICD-10-CM

## 2016-05-25 DIAGNOSIS — F329 Major depressive disorder, single episode, unspecified: Secondary | ICD-10-CM | POA: Diagnosis present

## 2016-05-25 DIAGNOSIS — J432 Centrilobular emphysema: Secondary | ICD-10-CM | POA: Diagnosis not present

## 2016-05-25 DIAGNOSIS — E872 Acidosis: Secondary | ICD-10-CM | POA: Diagnosis present

## 2016-05-25 DIAGNOSIS — G4730 Sleep apnea, unspecified: Secondary | ICD-10-CM | POA: Diagnosis present

## 2016-05-25 DIAGNOSIS — J208 Acute bronchitis due to other specified organisms: Secondary | ICD-10-CM | POA: Diagnosis present

## 2016-05-25 DIAGNOSIS — E8729 Other acidosis: Secondary | ICD-10-CM | POA: Diagnosis present

## 2016-05-25 DIAGNOSIS — J449 Chronic obstructive pulmonary disease, unspecified: Secondary | ICD-10-CM | POA: Diagnosis present

## 2016-05-25 DIAGNOSIS — I480 Paroxysmal atrial fibrillation: Secondary | ICD-10-CM | POA: Diagnosis present

## 2016-05-25 DIAGNOSIS — I110 Hypertensive heart disease with heart failure: Secondary | ICD-10-CM | POA: Diagnosis present

## 2016-05-25 DIAGNOSIS — W1830XA Fall on same level, unspecified, initial encounter: Secondary | ICD-10-CM | POA: Diagnosis present

## 2016-05-25 DIAGNOSIS — J9611 Chronic respiratory failure with hypoxia: Secondary | ICD-10-CM | POA: Diagnosis present

## 2016-05-25 DIAGNOSIS — F32A Depression, unspecified: Secondary | ICD-10-CM | POA: Diagnosis present

## 2016-05-25 DIAGNOSIS — Z7901 Long term (current) use of anticoagulants: Secondary | ICD-10-CM | POA: Diagnosis not present

## 2016-05-25 DIAGNOSIS — J441 Chronic obstructive pulmonary disease with (acute) exacerbation: Principal | ICD-10-CM | POA: Diagnosis present

## 2016-05-25 DIAGNOSIS — M25552 Pain in left hip: Secondary | ICD-10-CM | POA: Diagnosis present

## 2016-05-25 DIAGNOSIS — I10 Essential (primary) hypertension: Secondary | ICD-10-CM | POA: Diagnosis present

## 2016-05-25 DIAGNOSIS — E119 Type 2 diabetes mellitus without complications: Secondary | ICD-10-CM

## 2016-05-25 DIAGNOSIS — S79912A Unspecified injury of left hip, initial encounter: Secondary | ICD-10-CM | POA: Diagnosis not present

## 2016-05-25 DIAGNOSIS — K219 Gastro-esophageal reflux disease without esophagitis: Secondary | ICD-10-CM | POA: Diagnosis present

## 2016-05-25 DIAGNOSIS — Z7951 Long term (current) use of inhaled steroids: Secondary | ICD-10-CM

## 2016-05-25 DIAGNOSIS — I5032 Chronic diastolic (congestive) heart failure: Secondary | ICD-10-CM | POA: Diagnosis present

## 2016-05-25 DIAGNOSIS — Z886 Allergy status to analgesic agent status: Secondary | ICD-10-CM

## 2016-05-25 DIAGNOSIS — J9621 Acute and chronic respiratory failure with hypoxia: Secondary | ICD-10-CM | POA: Insufficient documentation

## 2016-05-25 DIAGNOSIS — S299XXA Unspecified injury of thorax, initial encounter: Secondary | ICD-10-CM | POA: Diagnosis not present

## 2016-05-25 DIAGNOSIS — Z6836 Body mass index (BMI) 36.0-36.9, adult: Secondary | ICD-10-CM

## 2016-05-25 DIAGNOSIS — Y92002 Bathroom of unspecified non-institutional (private) residence single-family (private) house as the place of occurrence of the external cause: Secondary | ICD-10-CM

## 2016-05-25 DIAGNOSIS — Z87891 Personal history of nicotine dependence: Secondary | ICD-10-CM

## 2016-05-25 DIAGNOSIS — Z833 Family history of diabetes mellitus: Secondary | ICD-10-CM

## 2016-05-25 DIAGNOSIS — Z794 Long term (current) use of insulin: Secondary | ICD-10-CM | POA: Diagnosis not present

## 2016-05-25 DIAGNOSIS — E1149 Type 2 diabetes mellitus with other diabetic neurological complication: Secondary | ICD-10-CM | POA: Diagnosis present

## 2016-05-25 DIAGNOSIS — E860 Dehydration: Secondary | ICD-10-CM | POA: Diagnosis not present

## 2016-05-25 DIAGNOSIS — Z7952 Long term (current) use of systemic steroids: Secondary | ICD-10-CM

## 2016-05-25 DIAGNOSIS — R0600 Dyspnea, unspecified: Secondary | ICD-10-CM

## 2016-05-25 DIAGNOSIS — T148XXA Other injury of unspecified body region, initial encounter: Secondary | ICD-10-CM | POA: Diagnosis not present

## 2016-05-25 DIAGNOSIS — R0602 Shortness of breath: Secondary | ICD-10-CM | POA: Diagnosis not present

## 2016-05-25 DIAGNOSIS — J9610 Chronic respiratory failure, unspecified whether with hypoxia or hypercapnia: Secondary | ICD-10-CM | POA: Diagnosis present

## 2016-05-25 DIAGNOSIS — W19XXXA Unspecified fall, initial encounter: Secondary | ICD-10-CM

## 2016-05-25 LAB — I-STAT VENOUS BLOOD GAS, ED
ACID-BASE EXCESS: 22 mmol/L — AB (ref 0.0–2.0)
Bicarbonate: 50.3 mmol/L — ABNORMAL HIGH (ref 20.0–28.0)
O2 SAT: 95 %
TCO2: >50 mmol/L (ref 0–100)
pCO2, Ven: 67.3 mmHg — ABNORMAL HIGH (ref 44.0–60.0)
pH, Ven: 7.482 — ABNORMAL HIGH (ref 7.250–7.430)
pO2, Ven: 74 mmHg — ABNORMAL HIGH (ref 32.0–45.0)

## 2016-05-25 LAB — CBC WITH DIFFERENTIAL/PLATELET
BAND NEUTROPHILS: 4 %
BLASTS: 0 %
Basophils Absolute: 0 10*3/uL (ref 0.0–0.1)
Basophils Relative: 0 %
EOS ABS: 0 10*3/uL (ref 0.0–0.7)
EOS PCT: 0 %
HEMATOCRIT: 40.9 % (ref 36.0–46.0)
HEMOGLOBIN: 13.3 g/dL (ref 12.0–15.0)
LYMPHS ABS: 2 10*3/uL (ref 0.7–4.0)
LYMPHS PCT: 12 %
MCH: 29.7 pg (ref 26.0–34.0)
MCHC: 32.5 g/dL (ref 30.0–36.0)
MCV: 91.3 fL (ref 78.0–100.0)
MONOS PCT: 8 %
Metamyelocytes Relative: 2 %
Monocytes Absolute: 1.3 10*3/uL — ABNORMAL HIGH (ref 0.1–1.0)
Myelocytes: 0 %
NEUTROS ABS: 13 10*3/uL — AB (ref 1.7–7.7)
Neutrophils Relative %: 72 %
OTHER: 2 %
Platelets: 195 10*3/uL (ref 150–400)
Promyelocytes Absolute: 0 %
RBC: 4.48 MIL/uL (ref 3.87–5.11)
RDW: 15.3 % (ref 11.5–15.5)
WBC: 16.7 10*3/uL — ABNORMAL HIGH (ref 4.0–10.5)
nRBC: 0 /100 WBC

## 2016-05-25 LAB — BASIC METABOLIC PANEL
ANION GAP: 16 — AB (ref 5–15)
BUN: 63 mg/dL — ABNORMAL HIGH (ref 6–20)
CHLORIDE: 81 mmol/L — AB (ref 101–111)
CO2: 35 mmol/L — AB (ref 22–32)
Calcium: 10.2 mg/dL (ref 8.9–10.3)
Creatinine, Ser: 2.07 mg/dL — ABNORMAL HIGH (ref 0.44–1.00)
GFR calc Af Amer: 27 mL/min — ABNORMAL LOW (ref >60)
GFR, EST NON AFRICAN AMERICAN: 23 mL/min — AB (ref >60)
GLUCOSE: 194 mg/dL — AB (ref 65–99)
POTASSIUM: 3 mmol/L — AB (ref 3.5–5.1)
Sodium: 132 mmol/L — ABNORMAL LOW (ref 135–145)

## 2016-05-25 LAB — TROPONIN I: TROPONIN I: 0.03 ng/mL — AB (ref <0.03)

## 2016-05-25 LAB — BRAIN NATRIURETIC PEPTIDE: B Natriuretic Peptide: 15.5 pg/mL (ref 0.0–100.0)

## 2016-05-25 LAB — TYPE AND SCREEN
ABO/RH(D): A POS
Antibody Screen: NEGATIVE

## 2016-05-25 LAB — PROTIME-INR
INR: 1.05
Prothrombin Time: 13.8 seconds (ref 11.4–15.2)

## 2016-05-25 LAB — I-STAT CG4 LACTIC ACID, ED: LACTIC ACID, VENOUS: 2.16 mmol/L — AB (ref 0.5–1.9)

## 2016-05-25 LAB — GLUCOSE, CAPILLARY: Glucose-Capillary: 222 mg/dL — ABNORMAL HIGH (ref 65–99)

## 2016-05-25 LAB — MAGNESIUM: MAGNESIUM: 2.3 mg/dL (ref 1.7–2.4)

## 2016-05-25 MED ORDER — INSULIN GLARGINE 100 UNIT/ML ~~LOC~~ SOLN
60.0000 [IU] | Freq: Every day | SUBCUTANEOUS | Status: DC
  Administered 2016-05-26 – 2016-05-28 (×3): 60 [IU] via SUBCUTANEOUS
  Filled 2016-05-25 (×3): qty 0.6

## 2016-05-25 MED ORDER — MORPHINE SULFATE (PF) 4 MG/ML IV SOLN: 2.0000 mg | INTRAVENOUS | Status: DC | PRN

## 2016-05-25 MED ORDER — POLYETHYLENE GLYCOL 3350 17 G PO PACK
17.0000 g | PACK | Freq: Every day | ORAL | Status: DC
  Administered 2016-05-26 – 2016-05-28 (×3): 17 g via ORAL
  Filled 2016-05-25 (×3): qty 1

## 2016-05-25 MED ORDER — ONDANSETRON HCL 4 MG/2ML IJ SOLN: 4.0000 mg | Freq: Four times a day (QID) | INTRAMUSCULAR | Status: DC | PRN

## 2016-05-25 MED ORDER — BUPROPION HCL ER (XL) 150 MG PO TB24
150.0000 mg | ORAL_TABLET | ORAL | Status: DC
  Administered 2016-05-26 – 2016-05-28 (×3): 150 mg via ORAL
  Filled 2016-05-25 (×3): qty 1

## 2016-05-25 MED ORDER — ADULT MULTIVITAMIN W/MINERALS CH
1.0000 | ORAL_TABLET | Freq: Every morning | ORAL | Status: DC
  Administered 2016-05-26 – 2016-05-28 (×3): 1 via ORAL
  Filled 2016-05-25 (×3): qty 1

## 2016-05-25 MED ORDER — POLYSACCHARIDE IRON COMPLEX 150 MG PO CAPS: 150.0000 mg | ORAL_CAPSULE | Freq: Two times a day (BID) | ORAL | Status: DC

## 2016-05-25 MED ORDER — RIVAROXABAN 15 MG PO TABS
15.0000 mg | ORAL_TABLET | Freq: Every day | ORAL | Status: DC
  Administered 2016-05-26 – 2016-05-27 (×2): 15 mg via ORAL
  Filled 2016-05-25 (×2): qty 1

## 2016-05-25 MED ORDER — MAGNESIUM SULFATE IN D5W 1-5 GM/100ML-% IV SOLN: 1.0000 g | Freq: Once | INTRAVENOUS | Status: DC

## 2016-05-25 MED ORDER — INSULIN ASPART 100 UNIT/ML ~~LOC~~ SOLN
3.0000 [IU] | Freq: Three times a day (TID) | SUBCUTANEOUS | Status: DC
  Administered 2016-05-26 – 2016-05-28 (×8): 3 [IU] via SUBCUTANEOUS

## 2016-05-25 MED ORDER — IPRATROPIUM-ALBUTEROL 0.5-2.5 (3) MG/3ML IN SOLN
3.0000 mL | Freq: Once | RESPIRATORY_TRACT | Status: AC
  Administered 2016-05-25: 3 mL via RESPIRATORY_TRACT
  Filled 2016-05-25: qty 3

## 2016-05-25 MED ORDER — POTASSIUM CHLORIDE CRYS ER 20 MEQ PO TBCR
40.0000 meq | EXTENDED_RELEASE_TABLET | Freq: Once | ORAL | Status: AC
  Administered 2016-05-25: 40 meq via ORAL
  Filled 2016-05-25: qty 2

## 2016-05-25 MED ORDER — PANTOPRAZOLE SODIUM 40 MG PO TBEC
40.0000 mg | DELAYED_RELEASE_TABLET | Freq: Every day | ORAL | Status: DC
  Administered 2016-05-26 – 2016-05-28 (×3): 40 mg via ORAL
  Filled 2016-05-25 (×3): qty 1

## 2016-05-25 MED ORDER — INSULIN ASPART 100 UNIT/ML ~~LOC~~ SOLN
0.0000 [IU] | Freq: Three times a day (TID) | SUBCUTANEOUS | Status: DC
  Administered 2016-05-26: 8 [IU] via SUBCUTANEOUS
  Administered 2016-05-26 (×2): 5 [IU] via SUBCUTANEOUS
  Administered 2016-05-27 (×2): 3 [IU] via SUBCUTANEOUS
  Administered 2016-05-27 – 2016-05-28 (×2): 8 [IU] via SUBCUTANEOUS
  Administered 2016-05-28: 3 [IU] via SUBCUTANEOUS

## 2016-05-25 MED ORDER — ALPRAZOLAM 0.25 MG PO TABS: 0.2500 mg | ORAL_TABLET | Freq: Three times a day (TID) | ORAL | Status: DC | PRN

## 2016-05-25 MED ORDER — BENZONATATE 100 MG PO CAPS
200.0000 mg | ORAL_CAPSULE | Freq: Every evening | ORAL | Status: DC | PRN
  Administered 2016-05-25: 200 mg via ORAL
  Filled 2016-05-25: qty 2

## 2016-05-25 MED ORDER — ACETAMINOPHEN 325 MG PO TABS: 650.0000 mg | ORAL_TABLET | Freq: Four times a day (QID) | ORAL | Status: DC | PRN

## 2016-05-25 MED ORDER — METHYLPREDNISOLONE SODIUM SUCC 125 MG IJ SOLR
60.0000 mg | Freq: Four times a day (QID) | INTRAMUSCULAR | Status: DC
  Administered 2016-05-25 – 2016-05-28 (×11): 60 mg via INTRAVENOUS
  Filled 2016-05-25 (×11): qty 2

## 2016-05-25 MED ORDER — RIVAROXABAN 20 MG PO TABS: 20.0000 mg | ORAL_TABLET | Freq: Every day | ORAL | Status: DC

## 2016-05-25 MED ORDER — ROFLUMILAST 500 MCG PO TABS
500.0000 ug | ORAL_TABLET | Freq: Every day | ORAL | Status: DC
  Administered 2016-05-26 – 2016-05-28 (×3): 500 ug via ORAL
  Filled 2016-05-25 (×3): qty 1

## 2016-05-25 MED ORDER — GABAPENTIN 600 MG PO TABS
600.0000 mg | ORAL_TABLET | Freq: Three times a day (TID) | ORAL | Status: DC
  Administered 2016-05-25 – 2016-05-28 (×8): 600 mg via ORAL
  Filled 2016-05-25 (×8): qty 1

## 2016-05-25 MED ORDER — DEXTROSE 5 % IV SOLN
1.0000 g | Freq: Once | INTRAVENOUS | Status: AC
  Administered 2016-05-25: 1 g via INTRAVENOUS
  Filled 2016-05-25: qty 10

## 2016-05-25 MED ORDER — VERAPAMIL HCL ER 120 MG PO TBCR
360.0000 mg | EXTENDED_RELEASE_TABLET | Freq: Every day | ORAL | Status: DC
  Administered 2016-05-26 – 2016-05-28 (×3): 360 mg via ORAL
  Filled 2016-05-25 (×3): qty 3

## 2016-05-25 MED ORDER — POTASSIUM CHLORIDE IN NACL 20-0.9 MEQ/L-% IV SOLN
INTRAVENOUS | Status: AC
  Administered 2016-05-25: 20:00:00 via INTRAVENOUS
  Filled 2016-05-25: qty 1000

## 2016-05-25 MED ORDER — INSULIN ASPART 100 UNIT/ML ~~LOC~~ SOLN
0.0000 [IU] | Freq: Every day | SUBCUTANEOUS | Status: DC
  Administered 2016-05-25 – 2016-05-26 (×2): 2 [IU] via SUBCUTANEOUS

## 2016-05-25 MED ORDER — ONDANSETRON HCL 4 MG PO TABS: 4.0000 mg | ORAL_TABLET | Freq: Four times a day (QID) | ORAL | Status: DC | PRN

## 2016-05-25 MED ORDER — ALBUTEROL SULFATE (2.5 MG/3ML) 0.083% IN NEBU
5.0000 mg | INHALATION_SOLUTION | Freq: Once | RESPIRATORY_TRACT | Status: DC
  Filled 2016-05-25: qty 6

## 2016-05-25 MED ORDER — DARIFENACIN HYDROBROMIDE ER 7.5 MG PO TB24
7.5000 mg | ORAL_TABLET | Freq: Every day | ORAL | Status: DC
  Administered 2016-05-26 – 2016-05-28 (×3): 7.5 mg via ORAL
  Filled 2016-05-25 (×3): qty 1

## 2016-05-25 MED ORDER — DULOXETINE HCL 60 MG PO CPEP
60.0000 mg | ORAL_CAPSULE | Freq: Every morning | ORAL | Status: DC
  Administered 2016-05-26 – 2016-05-28 (×3): 60 mg via ORAL
  Filled 2016-05-25 (×3): qty 1

## 2016-05-25 MED ORDER — IPRATROPIUM-ALBUTEROL 0.5-2.5 (3) MG/3ML IN SOLN
RESPIRATORY_TRACT | Status: AC
  Administered 2016-05-25: 3 mL
  Filled 2016-05-25: qty 3

## 2016-05-25 MED ORDER — ATORVASTATIN CALCIUM 40 MG PO TABS
40.0000 mg | ORAL_TABLET | Freq: Every day | ORAL | Status: DC
  Administered 2016-05-25 – 2016-05-27 (×3): 40 mg via ORAL
  Filled 2016-05-25 (×3): qty 1

## 2016-05-25 MED ORDER — SODIUM CHLORIDE 0.9 % IV BOLUS (SEPSIS)
500.0000 mL | Freq: Once | INTRAVENOUS | Status: AC
  Administered 2016-05-25: 500 mL via INTRAVENOUS

## 2016-05-25 MED ORDER — LEVOFLOXACIN IN D5W 500 MG/100ML IV SOLN
500.0000 mg | INTRAVENOUS | Status: DC
  Administered 2016-05-26: 500 mg via INTRAVENOUS
  Filled 2016-05-25: qty 100

## 2016-05-25 MED ORDER — MOMETASONE FURO-FORMOTEROL FUM 200-5 MCG/ACT IN AERO
2.0000 | INHALATION_SPRAY | Freq: Two times a day (BID) | RESPIRATORY_TRACT | Status: DC
  Administered 2016-05-25 – 2016-05-28 (×5): 2 via RESPIRATORY_TRACT
  Filled 2016-05-25 (×2): qty 8.8

## 2016-05-25 MED ORDER — METHYLPREDNISOLONE SODIUM SUCC 125 MG IJ SOLR
125.0000 mg | Freq: Once | INTRAMUSCULAR | Status: AC
  Administered 2016-05-25: 125 mg via INTRAVENOUS
  Filled 2016-05-25: qty 2

## 2016-05-25 MED ORDER — HYDROCODONE-ACETAMINOPHEN 5-325 MG PO TABS
1.0000 | ORAL_TABLET | ORAL | Status: DC | PRN
  Administered 2016-05-25 – 2016-05-27 (×4): 1 via ORAL
  Filled 2016-05-25 (×4): qty 1

## 2016-05-25 MED ORDER — SODIUM CHLORIDE 0.9% FLUSH
3.0000 mL | Freq: Two times a day (BID) | INTRAVENOUS | Status: DC
  Administered 2016-05-25 – 2016-05-28 (×6): 3 mL via INTRAVENOUS

## 2016-05-25 MED ORDER — IPRATROPIUM-ALBUTEROL 0.5-2.5 (3) MG/3ML IN SOLN: 3.0000 mL | RESPIRATORY_TRACT | Status: DC | PRN

## 2016-05-25 NOTE — H&P (Signed)
History and Physical    SHNIYA ANCEL [id number] DOB: [birth year] DOA: 05/25/2016  PCP: Marton Redwood, MD   Patient coming from: Home   Chief Complaint: Fall with left hip pain, dyspnea, cough  HPI: Destiny Shelton is a 70 y.o. female with medical history significant for COPD with chronic hypoxic respiratory failure, chronic diastolic CHF, paroxysmal atrial fibrillation on Xarelto, insulin-dependent diabetes mellitus, and hypertension who presents to the emergency department with left hip pain following a fall at home, and several days of progressive dyspnea and productive cough. Patient reports that she has been suffering from increased dyspnea with cough productive of thick sputum and an increase in her supplemental oxygen requirement for more than a week now. She was evaluated by her pulmonologist on 05/19/2016, diagnosed with exacerbations in her COPD and CHF, and was prescribed a 5 day course of prednisone, doxycycline, and three-day course of metolazone. Patient has lost several pounds with this regimen, but reports no appreciable improvement in her respiratory status. She denies any fevers or chills, denies rhinorrhea or sore throat, and denies sick contacts. She also denies chest pain or palpitations and reports resolution and her bilateral lower extremity edema with the recent metolazone. Just prior to arrival, she suffered a mechanical fall in her bathroom at home, striking her left hip on the bathtub, but with no head strike or loss of consciousness.   ED Course: Upon arrival to the ED, patient is found to be afebrile, saturating high 80s on 4 L/m supplemental oxygen, soft but stable blood pressure, and with vitals otherwise stable. EKG features a sinus rhythm with interventricular conduction delay and inferior Q wave. Chest x-ray is notable for cardiomegaly with increased peripheral interstitial thickening, possibly suggestive of mild pulmonary vascular congestion. Radiographs of the  hip are negative for acute fracture. Chemistry panel features widespread derangements, including sodium 132, potassium 3.0, BUN 63, and serum creatinine of 2.07, up from an apparent baseline of 0.9. CBC includes a leukocytosis to 16,700 with a mild left shift. Lactic acid is elevated at 2.16, INR is within the normal limits, BNP is 15, and troponin is borderline elevated to 0.03. Bedside ultrasound was performed by the ED physician and there appeared to be good EF and collapse of the IVC with respirations. Patient was given a 500 mL normal saline bolus, blood cultures were obtained, and she was treated with Rocephin, DuoNeb, 125 mg IV Solu-Medrol, and 40 mEq of oral potassium. Blood pressure is remained soft, but stable, and the patient is saturating and mentating appropriately now on 6 L/m of supplemental oxygen. She will be admitted to the telemetry unit for ongoing evaluation and management of COPD with acute exacerbation, a catheter, and possible respiratory infection.  Review of Systems:  All other systems reviewed and apart from HPI, are negative.  Past Medical History:  Diagnosis Date  . Adenomatous colon polyp   . ALLERGIC RHINITIS   . Anemia    iron deficient  . Anxiety   . Atrial tachycardia (HCC)    Mostly Sinus Tachycardia  . Back pain, chronic   . Carotid stenosis   . CHF (congestive heart failure) ([place])   . Community acquired pneumonia - Recent Admission (07/2013) 07/08/2013  . COPD (chronic obstructive pulmonary disease) ([place])    Requiring Home O2 at 4L  . Depression   . Diabetes mellitus type 2 with neurological manifestations ([place])    On Insulin  . Diverticulosis of colon   . External hemorrhoids   . Fibromyalgia    "  pain in arms and shoulders."  . Gastritis   . GERD (gastroesophageal reflux disease)   . Headache(784.0)    tension  . Hyperlipemia   . Hypertension   . Inappropriate sinus tachycardia   . Morbidly obese ([place])   . Nephrolithiasis   . Neuromuscular  disorder ([place])    diabetic neuropathy  . Osteoarthritis   . Osteoporosis   . PAF (paroxysmal atrial fibrillation) ([place])   . 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  . Sleep apnea    no cpap machine  02 at 4l/min all the time  . Urosepsis 05/26/2012    Past Surgical History:  Procedure Laterality Date  . ABDOMINAL HYSTERECTOMY  1978  . APPENDECTOMY  1963  . CARDIAC CATHETERIZATION N/A 07/04/2015   Procedure: Right Heart Cath;  Surgeon: Larey Dresser, MD;  Location: [facility];  Service: Cardiovascular;  Laterality: N/A;  . CHOLECYSTECTOMY  1963  . COLONOSCOPY N/A 12/21/2012   Procedure: COLONOSCOPY;  Surgeon: Gatha Mayer, MD;  Location: [facility];  Service: Endoscopy;  Laterality: N/A;  . COLONOSCOPY N/A 11/17/2013   Procedure: COLONOSCOPY;  Surgeon: Gatha Mayer, MD;  Location: [facility];  Service: Endoscopy;  Laterality: N/A;  . COLONOSCOPY W/ BIOPSIES AND POLYPECTOMY  07/22/2007, 04/28/2007   2009: diverticulosis, 2008: diverticulosis, adenomatous polyps  . COLONOSCOPY WITH PROPOFOL N/A 10/01/2012   Procedure: COLONOSCOPY WITH PROPOFOL;  Surgeon: Gatha Mayer, MD;  Location: [facility];  Service: Endoscopy;  Laterality: N/A;  . COLONOSCOPY WITH PROPOFOL N/A 10/10/2014   Procedure: COLONOSCOPY WITH PROPOFOL;  Surgeon: Gatha Mayer, MD;  Location: [facility];  Service: Endoscopy;  Laterality: N/A;  . ESOPHAGOGASTRODUODENOSCOPY (EGD) WITH PROPOFOL N/A 10/01/2012   Procedure: ESOPHAGOGASTRODUODENOSCOPY (EGD) WITH PROPOFOL;  Surgeon: Gatha Mayer, MD;  Location: [facility];  Service: Endoscopy;  Laterality: N/A;  . HOT HEMOSTASIS N/A 12/21/2012   Procedure: HOT HEMOSTASIS (ARGON PLASMA COAGULATION/BICAP);  Surgeon: Gatha Mayer, MD;  Location: [facility];  Service: Endoscopy;  Laterality: N/A;  . HOT HEMOSTASIS N/A 10/10/2014   Procedure: HOT HEMOSTASIS (ARGON PLASMA COAGULATION/BICAP);  Surgeon: Gatha Mayer, MD;  Location: [facility];  Service: Endoscopy;  Laterality:  N/A;  . LEFT AND RIGHT HEART CATHETERIZATION WITH CORONARY ANGIOGRAM  12/19/2011   Procedure: LEFT AND RIGHT HEART CATHETERIZATION WITH CORONARY ANGIOGRAM;  Surgeon: Leonie Man, MD;  Location: [facility] CATH LAB;  Service: Cardiovascular;;  . RIGHT AND LEFT CARDIAC CATHETERIZATION  July 2013   RAP: 22 mmHg, and RVP: 46/16 mmHg, RV EDP 18; L. BP 110/11 mmHg, LVEDP 20 mmHg;  PAP 45/29 mmHg (mean 37 mmHg); PCWP 29/22 mmHg - 26 mmHg; no significant coronary disease  . TEE WITHOUT CARDIOVERSION  03/24/2012   Procedure: TRANSESOPHAGEAL ECHOCARDIOGRAM (TEE);  Surgeon: Pixie Casino, MD;  Location: [facility] OR;  Service: Cardiovascular;  Laterality: N/A;  TEE with Bubble  . TOTAL KNEE ARTHROPLASTY  1997   right  . UPPER GASTROINTESTINAL ENDOSCOPY  04/09/2006   w/Dilation, gastritis     reports that she quit smoking about 21 years ago. Her smoking use included Cigarettes. She has a 60.00 pack-year smoking history. She has never used smokeless tobacco. She reports that she does not drink alcohol or use drugs.  Allergies  Allergen Reactions  . Aspirin Shortness Of Breath  . Nsaids Shortness Of Breath    Family History  Problem Relation Age of Onset  . Heart disease Mother   . Stroke Mother   . Heart disease Father   .  Heart disease Brother   . Stroke Brother   . Skin cancer Brother   . Colon polyps Sister   . Diabetes Sister   . Diabetes Brother   . Irritable bowel syndrome Daughter   . Colon cancer Neg Hx      Prior to Admission medications   Medication Sig Start Date End Date Taking? Authorizing Provider  acetaminophen (TYLENOL) 500 MG tablet 500 mg every 6 (six) hours as needed for mild pain. Take per bottle as needed for pain    Historical Provider, MD  albuterol (PROVENTIL) (2.5 MG/3ML) 0.083% nebulizer solution Take 3 mLs (2.5 mg total) by nebulization every 6 (six) hours as needed for wheezing or shortness of breath. 06/28/14   Kathee Delton, MD  ALPRAZolam Duanne Moron) 0.5 MG tablet Take  0.25 mg by mouth daily as needed for anxiety or sleep.     Historical Provider, MD  atorvastatin (LIPITOR) 40 MG tablet Take 40 mg by mouth at bedtime.  06/06/13   Historical Provider, MD  benzonatate (TESSALON) 200 MG capsule Take 200 mg by mouth at bedtime as needed for cough.     Historical Provider, MD  buPROPion (WELLBUTRIN XL) 150 MG 24 hr tablet Take 150 mg by mouth every morning.     Historical Provider, MD  DALIRESP 500 MCG TABS tablet Take 500 mcg by mouth daily.  07/04/15   Historical Provider, MD  doxycycline (VIBRA-TABS) 100 MG tablet Take 1 tablet (100 mg total) by mouth 2 (two) times daily. 05/19/16   Juanito Doom, MD  DULoxetine (CYMBALTA) 60 MG capsule Take 60 mg by mouth every morning.     Historical Provider, MD  furosemide (LASIX) 40 MG tablet Take 80 mg by mouth 2 (two) times daily. 07/13/13   Marton Redwood, MD  gabapentin (NEURONTIN) 600 MG tablet Take 600 mg by mouth 3 (three) times daily.     Historical Provider, MD  HYDROcodone-acetaminophen (NORCO/VICODIN) 5-325 MG per tablet Take 1 tablet by mouth every 4 (four) hours as needed for moderate pain. Reported on 12/12/2015    Historical Provider, MD  insulin glargine (LANTUS) 100 UNIT/ML injection Inject 0.66 mLs (66 Units total) into the skin daily before breakfast. 01/24/16   Janece Canterbury, MD  insulin lispro (HUMALOG) 100 UNIT/ML injection Inject 8 Units into the skin 2 (two) times daily.    Historical Provider, MD  irbesartan (AVAPRO) 150 MG tablet Take 1 tablet (150 mg total) by mouth daily. PLEASE CONTACT OFFICE FOR ADDITIONAL REFILLS 05/09/16   Leonie Man, MD  iron polysaccharides (NIFEREX) 150 MG capsule Take 1 capsule (150 mg total) by mouth 2 (two) times daily. 01/24/16   Janece Canterbury, MD  levalbuterol [facility] HFA) 45 MCG/ACT inhaler Inhale 1-2 puffs into the lungs every 4 (four) hours as needed for wheezing. 06/01/12   Marton Redwood, MD  metolazone (ZAROXOLYN) 5 MG tablet Take 1 tablet (5 mg total) by mouth  daily. 05/19/16   Juanito Doom, MD  Multiple Vitamin (MULTIVITAMIN) tablet Take 1 tablet by mouth every morning.     Historical Provider, MD  omeprazole (PRILOSEC) 20 MG capsule Take 20 mg by mouth daily.     Historical Provider, MD  ONE TOUCH ULTRA TEST test strip 1 each by Other route 3 (three) times daily.  07/10/14   Historical Provider, MD  OXYGEN 5 L at baseline, Increase oxygen to 6L with acitivity    Historical Provider, MD  polyethylene glycol (MIRALAX / GLYCOLAX) packet Take 17 g by  mouth daily.    Historical Provider, MD  potassium chloride (K-DUR,KLOR-CON) 10 MEQ tablet Take 3 tablets (30 mEq total) by mouth 2 (two) times daily. 04/18/16   Larey Dresser, MD  predniSONE (DELTASONE) 20 MG tablet Take 1 tablet (20 mg total) by mouth daily with breakfast. 05/19/16   Juanito Doom, MD  rivaroxaban (XARELTO) 20 MG TABS tablet Take 1 tablet (20 mg total) by mouth daily with supper. Restart on Wednesday May 11 , 2016 10/10/14   Gatha Mayer, MD  solifenacin (VESICARE) 5 MG tablet Take 5 mg by mouth daily.    Historical Provider, MD  SYMBICORT 160-4.5 MCG/ACT inhaler INHALE 2 PUFFS BY MOUTH TWICE DAILY 05/13/16   Juanito Doom, MD  tiotropium (SPIRIVA) 18 MCG inhalation capsule Place 1 capsule (18 mcg total) into inhaler and inhale every morning. 08/10/15   Juanito Doom, MD  verapamil (VERELAN PM) 360 MG 24 hr capsule Take 1 capsule (360 mg total) by mouth daily. KEEP OV. 05/13/16   Leonie Man, MD    Physical Exam: Vitals:   05/25/16 1543 05/25/16 1545 05/25/16 1630 05/25/16 1828  BP: (!) 117/51  (!) 103/45   Pulse: 93  90   Resp: 18  18   Temp: 97.3 F (36.3 C)   98.3 F (36.8 C)  TempSrc: Oral   Rectal  SpO2: (!) 89%  92%   Weight:  97.1 kg (214 lb)        Constitutional: Appears chronically-ill, uncomfortable, and in mild respiratory distress with accessory muscle recruitment. No pallor.  Eyes: PERTLA, lids and conjunctivae normal ENMT: Mucous membranes  are moist. Posterior pharynx clear of any exudate or lesions.   Neck: normal, supple, no masses, no thyromegaly Respiratory: Increased WOB. Prolonged expiratory phase with expiratory wheezes. No pallor or cyanosis.  Cardiovascular: S1 & S2 heard, regular rate and rhythm. No extremity edema. No significant JVD. Abdomen: No distension, no tenderness, no masses palpated. Bowel sounds normal.  Musculoskeletal: no clubbing / cyanosis. No joint deformity upper and lower extremities. Tender in left flank and hip without ecchymosis or skin break. Sensation and pulse intact in left foot.  Skin: no significant rashes, lesions, ulcers. Warm, dry, well-perfused. Neurologic: CN 2-12 grossly intact. Sensation intact, DTR normal. Strength 5/5 in all 4 limbs.  Psychiatric: Normal judgment and insight. Alert and oriented x 3. Normal mood and affect.     Labs on Admission: I have personally reviewed following labs and imaging studies  CBC:  Recent Labs Lab 05/25/16 1558  WBC 16.7*  NEUTROABS 13.0*  HGB 13.3  HCT 40.9  MCV 91.3  PLT 0000000   Basic Metabolic Panel:  Recent Labs Lab 05/19/16 1059 05/25/16 1558 05/25/16 1744  NA 140 132*  --   K 4.5 3.0*  --   CL 98 81*  --   CO2 33* 35*  --   GLUCOSE 126* 194*  --   BUN 26* 63*  --   CREATININE 1.31* 2.07*  --   CALCIUM 9.3 10.2  --   MG  --   --  2.3   GFR: Estimated Creatinine Clearance: 28.7 mL/min (by C-G formula based on SCr of 2.07 mg/dL (H)). Liver Function Tests: No results for input(s): AST, ALT, ALKPHOS, BILITOT, PROT, ALBUMIN in the last 168 hours. No results for input(s): LIPASE, AMYLASE in the last 168 hours. No results for input(s): AMMONIA in the last 168 hours. Coagulation Profile:  Recent Labs Lab 05/25/16 1558  INR  1.05   Cardiac Enzymes:  Recent Labs Lab 05/25/16 1558  TROPONINI 0.03*   BNP (last 3 results)  Recent Labs  05/19/16 1059  PROBNP 47   HbA1C: No results for input(s): HGBA1C in the last 72  hours. CBG: No results for input(s): GLUCAP in the last 168 hours. Lipid Profile: No results for input(s): CHOL, HDL, LDLCALC, TRIG, CHOLHDL, LDLDIRECT in the last 72 hours. Thyroid Function Tests: No results for input(s): TSH, T4TOTAL, FREET4, T3FREE, THYROIDAB in the last 72 hours. Anemia Panel: No results for input(s): VITAMINB12, FOLATE, FERRITIN, TIBC, IRON, RETICCTPCT in the last 72 hours. Urine analysis:    Component Value Date/Time   COLORURINE YELLOW 02/10/2016 1150   APPEARANCEUR CLEAR 02/10/2016 1150   LABSPEC 1.011 02/10/2016 1150   PHURINE 5.5 02/10/2016 1150   GLUCOSEU NEGATIVE 02/10/2016 1150   HGBUR NEGATIVE 02/10/2016 1150   BILIRUBINUR NEGATIVE 02/10/2016 1150   KETONESUR NEGATIVE 02/10/2016 1150   PROTEINUR NEGATIVE 02/10/2016 1150   UROBILINOGEN 0.2 07/09/2013 0058   NITRITE NEGATIVE 02/10/2016 1150   LEUKOCYTESUR NEGATIVE 02/10/2016 1150   Sepsis Labs: @LABRCNTIP (procalcitonin:4,lacticidven:4) )No results found for this or any previous visit (from the past 240 hour(s)).   Radiological Exams on Admission: Dg Chest 1 View  Result Date: 05/25/2016 CLINICAL DATA:  Fall with left hip pain. EXAM: CHEST 1 VIEW COMPARISON:  02/10/2016 FINDINGS: Hyperinflation. Apical lordotic positioning. Midline trachea. Cardiomegaly accentuated by AP portable technique. Atherosclerosis in the transverse aorta. No pleural effusion or pneumothorax. Increased peripheral predominant interstitial thickening. Left base volume loss with scarring or subsegmental atelectasis. IMPRESSION: No acute or posttraumatic deformity identified. Cardiomegaly. Increase in peripheral predominant interstitial thickening which in the acute setting could represent mild pulmonary venous congestion. Left base scarring or atelectasis. Electronically Signed   By: Abigail Miyamoto M.D.   On: 05/25/2016 17:23   Ct Hip Left Wo Contrast  Result Date: 05/25/2016 CLINICAL DATA:  Fall, left hip injury, left hip pain.  EXAM: CT OF THE LEFT HIP WITHOUT CONTRAST TECHNIQUE: Multidetector CT imaging of the left hip was performed according to the standard protocol. Multiplanar CT image reconstructions were also generated. COMPARISON:  Radiographs from 05/25/2016 FINDINGS: Bones/Joint/Cartilage Lower lumbar facet arthropathy at L4-5 and L5-S1, considerable central narrowing of the thecal sac at L4-5 due to disc bulge in the underlying facet arthropathy. I do not see a definite hip fracture on the left. There is some spurring and sclerosis between the fourth and fifth sacral segments partially included on the sagittal projections such as image 1/204. No definite left hip joint effusion. Articular cartilage in the left hip is preserved. Sacrococcygeal junction Ligaments Suboptimally assessed by CT. Muscles and Tendons Minimal thickening along the iliotibial band but without masslike hematoma. Soft tissues Sigmoid diverticulosis. Iliac artery atherosclerotic vascular disease. IMPRESSION: 1. Considerable central narrowing of the thecal sac at L4-5 due to disc bulge and facet arthropathy. 2. There is some mild thickening of the iliotibial band which could reflect a low-grade injury, but there is no mass like hematoma or characteristic Sherry Ruffing lesion. 3. Atherosclerosis. 4. Sigmoid diverticulosis. Electronically Signed   By: Van Clines M.D.   On: 05/25/2016 18:22   Dg Hip Unilat With Pelvis 2-3 Views Left  Result Date: 05/25/2016 CLINICAL DATA:  Fall.  Pain. EXAM: DG HIP (WITH OR WITHOUT PELVIS) 2-3V LEFT COMPARISON:  None. FINDINGS: AP view of the pelvis and AP/ frog leg views of the left hip. Femoral heads are located. Vascular calcifications. Sacroiliac joints are symmetric. No acute  fracture. IMPRESSION: No acute osseous abnormality. Electronically Signed   By: Abigail Miyamoto M.D.   On: 05/25/2016 17:21    EKG: Independently reviewed. Sinus rhythm, IVCD, inferior Q-wave   Assessment/Plan  1. COPD with acute  exacerbation  - Worsening dyspnea and increased O2-requirement despite just completing a course of prednisone and doxycycline  - There is a mild leukocytosis (possibly d/t steroid), mild elevation in lactate, but no fever and no infiltrate on CXR  - She was treated with Rocephin, 125 mg IV Solu-Medrol, and DuoNeb in ED - Obtain sputum culture and gram stain, continue abx with Levaquin, continue systemic steroid with 60 mg IV Solu-Medrol q6h, continue nebs, Daliresp    2. Acute kidney injury  - SCr is 2.07 on admission, up from baseline of 0.9  - Suspected secondary to recent diuresis with metolazone in addition to her usual Lasix  - Wt is 214 lbs on admission, down from usual wt of 216-220 per recent specialist notes - She was given 500 cc NS in ED and will be continued on a very gentle IVF hydration overnight - Hold Lasix and ARB for now, check urine studies, repeat chem panel in am; if renal fxn fails to improve soon, may need to change her anticoagulation   3. Hypokalemia  - Serum potassium is 3.0 on admission with normal mag level - She was given 40 mEq of oral potassium in ED  - KCl added to IVF with gentle infusion overnight  - Monitor on telemetry, repeat chem panel in am   4. Chronic diastolic CHF  - Pt appears dry on admission and was given 500 cc NS in ED  - TTE (07/17/15) with EF 55-60%, mild LVH, grade 1 diastolic CHF, mild MR, and mild LAE  - Plan to continue a very gentle IVF hydration overnight, follow daily wts and strict I/Os, hold Lasix on admission and resume as appropriate; her ARB is held on admission in setting of AKI    5. Paroxysmal atrial fibrillation  - Pt is in sinus rhythm on admission  - CHAD-VASc is 53 (age, gender, CHF, HTN)  - Continue anticoagulation with Xarelto, mindful that if renal fxn worsens, will likely need to change  - Continue verapamil for rate-control    6. Insulin-dependent DM  - A1c was 6.4% in 2013  - She is managed with Lantus 66 units qD  and Humalog 8 units BID at home - Check CBG with meals and qHS  - Continue Lantus at 60 units qD with Novololg 3 units with meals and per moderate-intensity sliding-scale   7. Fall with left hip pain   - Pt suffered a mechanical fall at home just PTA and struck left hip on bathtub; no head strike or LOC  - Radiographs negative for acute fracture  - CT obtained given persistent pain and difficulty ambulating; this is notable for possible low-grade IT band injury, but no fracture or dislocation  - Pain-control prn; PT eval requested    8. Hyponatremia  - Serum sodium is 132 on admission in setting of dehydration  - She was given 500 cc NS in ED and is continued on a gentle NS infusion overnight  - Repeat chem panel in am   9. Hypertension - BP has been soft but stable in ED - Hold irbesartan and Lasix in setting of AKI and soft BP  - Continue verapamil as tolerated for rate-control    DVT prophylaxis: Xarelto  Code Status: Full  Family Communication:  Husband updated at bedside  Disposition Plan: Admit to telemetry Consults called: None Admission status: Inpatient    Vianne Bulls, MD [facility] Pager [phone]  If 7PM-7AM, please contact night-coverage [email or web address] Password [facility]  05/25/2016, 7:15 PM

## 2016-05-25 NOTE — ED Triage Notes (Signed)
Per [facility] patient from home after fall.  Patient hit left hip on bathtub when she fell, complains of left hip pain.  Patient also has been feeling increased shortness of breath in the last few days.  Patient denies LOC.  Patient alert and oriented at this time.

## 2016-05-25 NOTE — ED Provider Notes (Signed)
[facility] DEPT Provider Note   CSN: [id number] Arrival date & time: 05/25/16  1535     History   Chief Complaint Chief Complaint  Patient presents with  . Fall    HPI Destiny Shelton is a 70 y.o. female.  Patient with history of COPD on home oxygen 3-4 L, reflux, urosepsis, congestive heart failure, paroxysmal atrial fibrillation presents with worsening short of breath and a fall at home. Patient is had worsening shortness of breath for past few days worse with activity. Patient's had protective cough and is on oral antibiotic at home. Patient slipped wearing slippers and hit her left hip at home without head injury. Pain with range of motion. Patient has had no fevers or chills. Symptoms intermittent. Patient has had increased Lasix dose recently and has had mild weight loss since.      Past Medical History:  Diagnosis Date  . Adenomatous colon polyp   . ALLERGIC RHINITIS   . Anemia    iron deficient  . Anxiety   . Atrial tachycardia (HCC)    Mostly Sinus Tachycardia  . Back pain, chronic   . Carotid stenosis   . CHF (congestive heart failure) ([place])   . Community acquired pneumonia - Recent Admission (07/2013) 07/08/2013  . COPD (chronic obstructive pulmonary disease) ([place])    Requiring Home O2 at 4L  . Depression   . Diabetes mellitus type 2 with neurological manifestations ([place])    On Insulin  . Diverticulosis of colon   . External hemorrhoids   . Fibromyalgia    "pain in arms and shoulders."  . Gastritis   . GERD (gastroesophageal reflux disease)   . Headache(784.0)    tension  . Hyperlipemia   . Hypertension   . Inappropriate sinus tachycardia   . Morbidly obese ([place])   . Nephrolithiasis   . Neuromuscular disorder ([place])    diabetic neuropathy  . Osteoarthritis   . Osteoporosis   . PAF (paroxysmal atrial fibrillation) ([place])   . 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  . Sleep apnea    no cpap machine  02 at 4l/min all the time  . Urosepsis 05/26/2012     Patient Active Problem List   Diagnosis Date Noted  . AKI (acute kidney injury) ([place]) 05/25/2016  . Acute on chronic respiratory failure with hypoxia ([place]) 05/25/2016  . Hyponatremia 05/25/2016  . Metabolic acidosis [id number]  . Acute hip pain, left 05/25/2016  . Acute renal failure (ARF) ([place]) 05/25/2016  . COPD exacerbation ([place]) 01/22/2016  . Biomechanical lesion of rib cage 01/22/2016  . Anemia 01/22/2016  . Chronic diastolic CHF (congestive heart failure) ([place]) 12/12/2015  . Morbid obesity ([place]) 12/29/2014  . Benign neoplasm of sigmoid colon   . Chronic diastolic heart failure of unknown etiology ([place]) 08/17/2013  . Chronic respiratory failure ([place]) 04/21/2013  . Polyposis coli-attenuated 01/24/2013  . Hypokalemia 10/02/2012  . Sigmoid tubular adenoma with high-grade dysplasia 10/01/2012  . Gastric polyp 10/01/2012  . Schatzki's ring 10/01/2012  . PAF (paroxysmal atrial fibrillation) ([place]) 05/29/2012  . Pulmonary hypertension ([place]) 04/15/2012  . Inappropriate sinus tachycardia   . Obesity, Class II, BMI 35-39.9, with comorbidity (HCC)     Class: Chronic  . History of failed moderate sedation 06/10/2011  . Pulmonary nodules 09/11/2010  . Centrilobular emphysema ([place]) 09/11/2010  . CHRONIC DYSPNEA 07/26/2009  . Diabetes mellitus, type II, insulin dependent ([place]) 07/29/2007    Class: Diagnosis of  . DIABETIC PERIPHERAL NEUROPATHY 07/29/2007  . Anxiety state 07/29/2007  .  Depression 07/29/2007  . Essential hypertension 07/29/2007  . NEPHROLITHIASIS 07/29/2007  . OSTEOARTHRITIS 07/29/2007  . Backache 07/29/2007  . OSTEOPOROSIS 07/29/2007  . Personal history of colonic polyps 04/28/2007    Past Surgical History:  Procedure Laterality Date  . ABDOMINAL HYSTERECTOMY  1978  . APPENDECTOMY  1963  . CARDIAC CATHETERIZATION N/A 07/04/2015   Procedure: Right Heart Cath;  Surgeon: Larey Dresser, MD;  Location: [facility];  Service: Cardiovascular;  Laterality:  N/A;  . CHOLECYSTECTOMY  1963  . COLONOSCOPY N/A 12/21/2012   Procedure: COLONOSCOPY;  Surgeon: Gatha Mayer, MD;  Location: [facility];  Service: Endoscopy;  Laterality: N/A;  . COLONOSCOPY N/A 11/17/2013   Procedure: COLONOSCOPY;  Surgeon: Gatha Mayer, MD;  Location: [facility];  Service: Endoscopy;  Laterality: N/A;  . COLONOSCOPY W/ BIOPSIES AND POLYPECTOMY  07/22/2007, 04/28/2007   2009: diverticulosis, 2008: diverticulosis, adenomatous polyps  . COLONOSCOPY WITH PROPOFOL N/A 10/01/2012   Procedure: COLONOSCOPY WITH PROPOFOL;  Surgeon: Gatha Mayer, MD;  Location: [facility];  Service: Endoscopy;  Laterality: N/A;  . COLONOSCOPY WITH PROPOFOL N/A 10/10/2014   Procedure: COLONOSCOPY WITH PROPOFOL;  Surgeon: Gatha Mayer, MD;  Location: [facility];  Service: Endoscopy;  Laterality: N/A;  . ESOPHAGOGASTRODUODENOSCOPY (EGD) WITH PROPOFOL N/A 10/01/2012   Procedure: ESOPHAGOGASTRODUODENOSCOPY (EGD) WITH PROPOFOL;  Surgeon: Gatha Mayer, MD;  Location: [facility];  Service: Endoscopy;  Laterality: N/A;  . HOT HEMOSTASIS N/A 12/21/2012   Procedure: HOT HEMOSTASIS (ARGON PLASMA COAGULATION/BICAP);  Surgeon: Gatha Mayer, MD;  Location: [facility];  Service: Endoscopy;  Laterality: N/A;  . HOT HEMOSTASIS N/A 10/10/2014   Procedure: HOT HEMOSTASIS (ARGON PLASMA COAGULATION/BICAP);  Surgeon: Gatha Mayer, MD;  Location: [facility];  Service: Endoscopy;  Laterality: N/A;  . LEFT AND RIGHT HEART CATHETERIZATION WITH CORONARY ANGIOGRAM  12/19/2011   Procedure: LEFT AND RIGHT HEART CATHETERIZATION WITH CORONARY ANGIOGRAM;  Surgeon: Leonie Man, MD;  Location: [facility] CATH LAB;  Service: Cardiovascular;;  . RIGHT AND LEFT CARDIAC CATHETERIZATION  July 2013   RAP: 22 mmHg, and RVP: 46/16 mmHg, RV EDP 18; L. BP 110/11 mmHg, LVEDP 20 mmHg;  PAP 45/29 mmHg (mean 37 mmHg); PCWP 29/22 mmHg - 26 mmHg; no significant coronary disease  . TEE WITHOUT CARDIOVERSION  03/24/2012   Procedure:  TRANSESOPHAGEAL ECHOCARDIOGRAM (TEE);  Surgeon: Pixie Casino, MD;  Location: [facility] OR;  Service: Cardiovascular;  Laterality: N/A;  TEE with [place]  . TOTAL KNEE ARTHROPLASTY  1997   right  . UPPER GASTROINTESTINAL ENDOSCOPY  04/09/2006   w/Dilation, gastritis    OB History    No data available       Home Medications    Prior to Admission medications   Medication Sig Start Date End Date Taking? Authorizing Provider  acetaminophen (TYLENOL) 500 MG tablet 500 mg every 6 (six) hours as needed for mild pain. Take per bottle as needed for pain    Historical Provider, MD  albuterol (PROVENTIL) (2.5 MG/3ML) 0.083% nebulizer solution Take 3 mLs (2.5 mg total) by nebulization every 6 (six) hours as needed for wheezing or shortness of breath. 06/28/14   Kathee Delton, MD  ALPRAZolam Duanne Moron) 0.5 MG tablet Take 0.25 mg by mouth daily as needed for anxiety or sleep.     Historical Provider, MD  atorvastatin (LIPITOR) 40 MG tablet Take 40 mg by mouth at bedtime.  06/06/13   Historical Provider, MD  benzonatate (TESSALON) 200 MG capsule Take 200 mg by  mouth at bedtime as needed for cough.     Historical Provider, MD  buPROPion (WELLBUTRIN XL) 150 MG 24 hr tablet Take 150 mg by mouth every morning.     Historical Provider, MD  DALIRESP 500 MCG TABS tablet Take 500 mcg by mouth daily.  07/04/15   Historical Provider, MD  doxycycline (VIBRA-TABS) 100 MG tablet Take 1 tablet (100 mg total) by mouth 2 (two) times daily. 05/19/16   Juanito Doom, MD  DULoxetine (CYMBALTA) 60 MG capsule Take 60 mg by mouth every morning.     Historical Provider, MD  furosemide (LASIX) 40 MG tablet Take 80 mg by mouth 2 (two) times daily. 07/13/13   Marton Redwood, MD  gabapentin (NEURONTIN) 600 MG tablet Take 600 mg by mouth 3 (three) times daily.     Historical Provider, MD  HYDROcodone-acetaminophen (NORCO/VICODIN) 5-325 MG per tablet Take 1 tablet by mouth every 4 (four) hours as needed for moderate pain. Reported on 12/12/2015     Historical Provider, MD  insulin glargine (LANTUS) 100 UNIT/ML injection Inject 0.66 mLs (66 Units total) into the skin daily before breakfast. 01/24/16   Janece Canterbury, MD  insulin lispro (HUMALOG) 100 UNIT/ML injection Inject 8 Units into the skin 2 (two) times daily.    Historical Provider, MD  irbesartan (AVAPRO) 150 MG tablet Take 1 tablet (150 mg total) by mouth daily. PLEASE CONTACT OFFICE FOR ADDITIONAL REFILLS 05/09/16   Leonie Man, MD  iron polysaccharides (NIFEREX) 150 MG capsule Take 1 capsule (150 mg total) by mouth 2 (two) times daily. 01/24/16   Janece Canterbury, MD  levalbuterol [facility] HFA) 45 MCG/ACT inhaler Inhale 1-2 puffs into the lungs every 4 (four) hours as needed for wheezing. 06/01/12   Marton Redwood, MD  metolazone (ZAROXOLYN) 5 MG tablet Take 1 tablet (5 mg total) by mouth daily. 05/19/16   Juanito Doom, MD  Multiple Vitamin (MULTIVITAMIN) tablet Take 1 tablet by mouth every morning.     Historical Provider, MD  omeprazole (PRILOSEC) 20 MG capsule Take 20 mg by mouth daily.     Historical Provider, MD  ONE TOUCH ULTRA TEST test strip 1 each by Other route 3 (three) times daily.  07/10/14   Historical Provider, MD  OXYGEN 5 L at baseline, Increase oxygen to 6L with acitivity    Historical Provider, MD  polyethylene glycol (MIRALAX / GLYCOLAX) packet Take 17 g by mouth daily.    Historical Provider, MD  potassium chloride (K-DUR,KLOR-CON) 10 MEQ tablet Take 3 tablets (30 mEq total) by mouth 2 (two) times daily. 04/18/16   Larey Dresser, MD  predniSONE (DELTASONE) 20 MG tablet Take 1 tablet (20 mg total) by mouth daily with breakfast. 05/19/16   Juanito Doom, MD  rivaroxaban (XARELTO) 20 MG TABS tablet Take 1 tablet (20 mg total) by mouth daily with supper. Restart on Wednesday May 11 , 2016 10/10/14   Gatha Mayer, MD  solifenacin (VESICARE) 5 MG tablet Take 5 mg by mouth daily.    Historical Provider, MD  SYMBICORT 160-4.5 MCG/ACT inhaler INHALE 2 PUFFS BY  MOUTH TWICE DAILY 05/13/16   Juanito Doom, MD  tiotropium (SPIRIVA) 18 MCG inhalation capsule Place 1 capsule (18 mcg total) into inhaler and inhale every morning. 08/10/15   Juanito Doom, MD  verapamil (VERELAN PM) 360 MG 24 hr capsule Take 1 capsule (360 mg total) by mouth daily. KEEP OV. 05/13/16   Leonie Man, MD    Family  History Family History  Problem Relation Age of Onset  . Heart disease Mother   . Stroke Mother   . Heart disease Father   . Heart disease Brother   . Stroke Brother   . Skin cancer Brother   . Colon polyps Sister   . Diabetes Sister   . Diabetes Brother   . Irritable bowel syndrome Daughter   . Colon cancer Neg Hx     Social History Social History  Substance Use Topics  . Smoking status: Former Smoker    Packs/day: 2.00    Years: 30.00    Types: Cigarettes    Quit date: 06/02/1994  . Smokeless tobacco: Never Used  . Alcohol use No     Allergies   Aspirin and Nsaids   Review of Systems Review of Systems  Constitutional: Negative for chills and fever.  HENT: Negative for congestion.   Eyes: Negative for visual disturbance.  Respiratory: Positive for cough and shortness of breath.   Cardiovascular: Negative for chest pain.  Gastrointestinal: Negative for abdominal pain and vomiting.  Genitourinary: Negative for dysuria and flank pain.  Musculoskeletal: Positive for arthralgias and gait problem. Negative for back pain, neck pain and neck stiffness.  Skin: Negative for rash.  Neurological: Negative for light-headedness and headaches.     Physical Exam Updated Vital Signs BP (!) 103/45   Pulse 90   Temp 97.3 F (36.3 C) (Oral)   Resp 18   Wt 214 lb (97.1 kg)   SpO2 92%   BMI 35.61 kg/m   Physical Exam  Constitutional: She is oriented to person, place, and time. She appears well-developed and well-nourished. No distress.  HENT:  Head: Normocephalic and atraumatic.  Eyes: Conjunctivae are normal. Right eye exhibits no  discharge. Left eye exhibits no discharge.  Neck: Normal range of motion. Neck supple. No tracheal deviation present.  Cardiovascular: Normal rate and regular rhythm.   No murmur heard. Pulmonary/Chest: No respiratory distress. She has rales (mild crackles at bases bilateral).  Abdominal: Soft. She exhibits no distension. There is no tenderness. There is no guarding.  Musculoskeletal: She exhibits no edema.  Neurological: She is alert and oriented to person, place, and time.  Skin: Skin is warm and dry. No rash noted. No erythema.  Psychiatric: She has a normal mood and affect.  Nursing note and vitals reviewed.    ED Treatments / Results  Labs (all labs ordered are listed, but only abnormal results are displayed) Labs Reviewed  BASIC METABOLIC PANEL - Abnormal; Notable for the following:       Result Value   Sodium 132 (*)    Potassium 3.0 (*)    Chloride 81 (*)    CO2 35 (*)    Glucose, Bld 194 (*)    BUN 63 (*)    Creatinine, Ser 2.07 (*)    GFR calc non Af Amer 23 (*)    GFR calc Af Amer 27 (*)    Anion gap 16 (*)    All other components within normal limits  CBC WITH DIFFERENTIAL/PLATELET - Abnormal; Notable for the following:    WBC 16.7 (*)    Neutro Abs 13.0 (*)    Monocytes Absolute 1.3 (*)    All other components within normal limits  TROPONIN I - Abnormal; Notable for the following:    Troponin I 0.03 (*)    All other components within normal limits  I-STAT CG4 LACTIC ACID, ED - Abnormal; Notable for the following:  Lactic Acid, Venous 2.16 (*)    All other components within normal limits  I-STAT VENOUS BLOOD GAS, ED - Abnormal; Notable for the following:    pH, Ven 7.482 (*)    pCO2, Ven 67.3 (*)    pO2, Ven 74.0 (*)    Bicarbonate 50.3 (*)    Acid-Base Excess 22.0 (*)    All other components within normal limits  CULTURE, BLOOD (ROUTINE X 2)  CULTURE, BLOOD (ROUTINE X 2)  PROTIME-INR  BRAIN NATRIURETIC PEPTIDE  BLOOD GAS, VENOUS  MAGNESIUM  TYPE  AND SCREEN    EKG  EKG Interpretation  Date/Time:  Sunday May 25 2016 16:00:17 EST Ventricular Rate:  89 PR Interval:    QRS Duration: 125 QT Interval:  425 QTC Calculation: 518 R Axis:   68 Text Interpretation:  Sinus rhythm IVCD, consider atypical RBBB Abnormal inferior Q waves QT prolonged Confirmed by Reather Converse MD, Gabriellah Rabel [phone]) on 05/25/2016 5:37:15 PM       Radiology Dg Chest 1 View  Result Date: 05/25/2016 CLINICAL DATA:  Fall with left hip pain. EXAM: CHEST 1 VIEW COMPARISON:  02/10/2016 FINDINGS: Hyperinflation. Apical lordotic positioning. Midline trachea. Cardiomegaly accentuated by AP portable technique. Atherosclerosis in the transverse aorta. No pleural effusion or pneumothorax. Increased peripheral predominant interstitial thickening. Left base volume loss with scarring or subsegmental atelectasis. IMPRESSION: No acute or posttraumatic deformity identified. Cardiomegaly. Increase in peripheral predominant interstitial thickening which in the acute setting could represent mild pulmonary venous congestion. Left base scarring or atelectasis. Electronically Signed   By: Abigail Miyamoto M.D.   On: 05/25/2016 17:23   Dg Hip Unilat With Pelvis 2-3 Views Left  Result Date: 05/25/2016 CLINICAL DATA:  Fall.  Pain. EXAM: DG HIP (WITH OR WITHOUT PELVIS) 2-3V LEFT COMPARISON:  None. FINDINGS: AP view of the pelvis and AP/ frog leg views of the left hip. Femoral heads are located. Vascular calcifications. Sacroiliac joints are symmetric. No acute fracture. IMPRESSION: No acute osseous abnormality. Electronically Signed   By: Abigail Miyamoto M.D.   On: 05/25/2016 17:21    Procedures Procedures (including critical care time)   EMERGENCY DEPARTMENT [place] CARDIAC EXAM "Study: Limited Ultrasound of the heart and pericardium"  INDICATIONS:Dyspnea Multiple views of the heart and pericardium were obtained in real-time with a multi-frequency probe.  PERFORMED [id number]  IMAGES ARCHIVED?:  Yes  FINDINGS: No pericardial effusion, IVC flat and Tamponade physiology absent  LIMITATIONS:  Body habitus  VIEWS USED: Subcostal 4 chamber, Parasternal long axis, Parasternal short axis and Inferior Vena Cava  INTERPRETATION: Cardiac activity present, Pericardial effusioin absent and Probable low CVP  CPT Code: [phone] (limited transthoracic cardiac)  Medications Ordered in ED Medications  ipratropium-albuterol (DUONEB) 0.5-2.5 (3) MG/3ML nebulizer solution 3 mL (not administered)  potassium chloride SA (K-DUR,KLOR-CON) CR tablet 40 mEq (not administered)  cefTRIAXone (ROCEPHIN) 1 g in dextrose 5 % 50 mL IVPB (not administered)  sodium chloride 0.9 % bolus 500 mL (not administered)  methylPREDNISolone sodium succinate (SOLU-MEDROL) 125 mg/2 mL injection 125 mg (125 mg Intravenous Given 05/25/16 1720)     Initial Impression / Assessment and Plan / ED Course  I have reviewed the triage vital signs and the nursing notes.  Pertinent labs & imaging results that were available during my care of the patient were reviewed by me and considered in my medical decision making (see chart for details).  Clinical Course    Patient presents after fall with left hip pain. Plan for hip fracture workup and in addition  shortness of breath evaluation for worsening dyspnea/fatigue which may have led to her fall. Patient requiring increased oxygen than her baseline. Evaluating for signs of pneumonia Versus COPD versus CHF.Marland Kitchen Patient also has underlying COPD. Patient does have a few crackles on exam which concerning for mild CHF versus pneumonia. Patient's BNP is normal, bedside ultrasound revealed okay ejection fraction and collapsing IVC. I feel that she has been over diuresed and is now intravascularly dehydrated. IV fluid bolus ordered. Rocephin ordered for possible pneumonia held on azithromycin due to prolonged QT.  Plan for telemetry admission. Discussed this with family. X-ray no acute fracture  left hip however patient does have persistent pain CT scan ordered.  The patients results and plan were reviewed and discussed.   Any x-rays performed were independently reviewed by myself.   Differential diagnosis were considered with the presenting HPI.  Medications  ipratropium-albuterol (DUONEB) 0.5-2.5 (3) MG/3ML nebulizer solution 3 mL (not administered)  potassium chloride SA (K-DUR,KLOR-CON) CR tablet 40 mEq (not administered)  cefTRIAXone (ROCEPHIN) 1 g in dextrose 5 % 50 mL IVPB (not administered)  sodium chloride 0.9 % bolus 500 mL (not administered)  methylPREDNISolone sodium succinate (SOLU-MEDROL) 125 mg/2 mL injection 125 mg (125 mg Intravenous Given 05/25/16 1720)    Vitals:   05/25/16 1543 05/25/16 1545 05/25/16 1630  BP: (!) 117/51  (!) 103/45  Pulse: 93  90  Resp: 18  18  Temp: 97.3 F (36.3 C)    TempSrc: Oral    SpO2: (!) 89%  92%  Weight:  214 lb (97.1 kg)     Final diagnoses:  Acute dyspnea  Left hip pain  Fall, initial encounter  Acute renal failure, unspecified acute renal failure type ([place])  Hypokalemia    Admission/ observation were discussed with the admitting physician, patient and/or family and they are comfortable with the plan.       Final Clinical Impressions(s) / ED Diagnoses   Final diagnoses:  Acute dyspnea  Left hip pain  Fall, initial encounter  Acute renal failure, unspecified acute renal failure type ([place])  Hypokalemia    New Prescriptions New Prescriptions   No medications on file     Elnora Morrison, MD 05/25/16 [phone]

## 2016-05-26 DIAGNOSIS — W19XXXA Unspecified fall, initial encounter: Secondary | ICD-10-CM

## 2016-05-26 LAB — GLUCOSE, CAPILLARY
GLUCOSE-CAPILLARY: 224 mg/dL — AB (ref 65–99)
Glucose-Capillary: 208 mg/dL — ABNORMAL HIGH (ref 65–99)
Glucose-Capillary: 279 mg/dL — ABNORMAL HIGH (ref 65–99)
Glucose-Capillary: 214 mg/dL — ABNORMAL HIGH (ref 65–99)

## 2016-05-26 LAB — BASIC METABOLIC PANEL
ANION GAP: 11 (ref 5–15)
BUN: 44 mg/dL — ABNORMAL HIGH (ref 6–20)
CALCIUM: 9.6 mg/dL (ref 8.9–10.3)
CO2: 40 mmol/L — AB (ref 22–32)
Chloride: 85 mmol/L — ABNORMAL LOW (ref 101–111)
Creatinine, Ser: 1.32 mg/dL — ABNORMAL HIGH (ref 0.44–1.00)
GFR, EST AFRICAN AMERICAN: 46 mL/min — AB (ref >60)
GFR, EST NON AFRICAN AMERICAN: 40 mL/min — AB (ref >60)
Glucose, Bld: 228 mg/dL — ABNORMAL HIGH (ref 65–99)
Potassium: 3 mmol/L — ABNORMAL LOW (ref 3.5–5.1)
Sodium: 136 mmol/L (ref 135–145)

## 2016-05-26 LAB — URINALYSIS, ROUTINE W REFLEX MICROSCOPIC
BILIRUBIN URINE: NEGATIVE
Bacteria, UA: NONE SEEN
Glucose, UA: >=500 mg/dL — AB
HGB URINE DIPSTICK: NEGATIVE
Ketones, ur: NEGATIVE mg/dL
LEUKOCYTES UA: NEGATIVE
NITRITE: NEGATIVE
PH: 6 (ref 5.0–8.0)
Protein, ur: NEGATIVE mg/dL
SPECIFIC GRAVITY, URINE: 1.01 (ref 1.005–1.030)
Squamous Epithelial / LPF: NONE SEEN

## 2016-05-26 LAB — MAGNESIUM: Magnesium: 2.3 mg/dL (ref 1.7–2.4)

## 2016-05-26 LAB — SODIUM, URINE, RANDOM: Sodium, Ur: 11 mmol/L

## 2016-05-26 LAB — CREATININE, URINE, RANDOM: Creatinine, Urine: 46.25 mg/dL

## 2016-05-26 MED ORDER — POTASSIUM CHLORIDE CRYS ER 20 MEQ PO TBCR
40.0000 meq | EXTENDED_RELEASE_TABLET | Freq: Once | ORAL | Status: AC
  Administered 2016-05-26: 40 meq via ORAL
  Filled 2016-05-26: qty 2

## 2016-05-26 NOTE — Progress Notes (Signed)
Pt. Sleeping well with no distress, 02 at 5l and depended at home, Has been up x1 at to bedside commode, has equipment at the house with husband, plan to continue antibiotic and Iv steroid.

## 2016-05-26 NOTE — Progress Notes (Signed)
[facility] Progress Note  Patient: Destiny Shelton [id number]   PCP: Marton Redwood, MD DOB: [birth year]   DOA: 05/25/2016   DOS: 05/26/2016   Date of Service: the patient was seen and examined on 05/26/2016  Brief hospital course: Pt. with PMH of COPD, type II DM, HTN, PAF, chronic respiratory failure on 5 L of oxygen; admitted on 05/25/2016, with complaint of shortness of breath, was found to have COPD exacerbation. Currently further plan is continue current management.  Assessment and Plan: 1. COPD exacerbation ([place]) Continue IV Levaquin, continue duo nebs, continue steroids. Monitor cultures. PCR negative.  2. Acute kidney injury. Improving with home stopping of diuretics. We will resume diuretic starting tomorrow. Avoid IV fluids.  3. Hypokalemia  - Serum potassium is 3.0 on admission with normal mag level - She was given 40 mEq of oral potassium in ED  - KCl added to IVF with gentle infusion overnight  - Monitor on telemetry, repeat chem panel in am   4. Chronic diastolic CHF  - Pt appears dry on admission and was given 500 cc NS in ED  - TTE (07/17/15) with EF 55-60%, mild LVH, grade 1 diastolic CHF, mild MR, and mild LAE  - Plan to continue a very gentle IVF hydration overnight, follow daily wts and strict I/Os, hold Lasix on admission and resume as appropriate; her ARB is held on admission in setting of AKI    5. Paroxysmal atrial fibrillation  - Pt is in sinus rhythm on admission  - CHAD-VASc is 87 (age, gender, CHF, HTN)  - Continue anticoagulation with Xarelto, mindful that if renal fxn worsens, will likely need to change  - Continue verapamil for rate-control    6. Insulin-dependent DM  - A1c was 6.4% in 2013  - She is managed with Lantus 66 units qD and Humalog 8 units BID at home - Check CBG with meals and qHS  - Continue Lantus at 60 units qD with Novololg 3 units with meals and per moderate-intensity sliding-scale   7. Fall with left hip pain     - Pt suffered a mechanical fall at home just PTA and struck left hip on bathtub; no head strike or LOC  - Radiographs negative for acute fracture  - CT obtained given persistent pain and difficulty ambulating; this is notable for possible low-grade IT band injury, but no fracture or dislocation  - Pain-control prn; PT eval requested    8. Hyponatremia  - Serum sodium is 132 on admission in setting of dehydration  - She was given 500 cc NS in ED and is continued on a gentle NS infusion overnight  - Repeat chem panel in am   9. Hypertension - BP has been soft but stable in ED - Hold irbesartan and Lasix in setting of AKI and soft BP  - Continue verapamil as tolerated for rate-control   Pain management: PRN tylenol Activity: consulted physical therapy Bowel regimen: last BM prior to admission Diet: cardiac diet DVT Prophylaxis: xaerlto  Advance goals of care discussion: full code  Family Communication: family was present at bedside, at the time of interview. The pt provided permission to discuss medical plan with the family. Opportunity was given to ask question and all questions were answered satisfactorily.   Disposition:  Discharge to home. Expected discharge date: 05/28/2016,   Consultants: none Procedures: none  Antibiotics: Anti-infectives    Start     Dose/Rate Route Frequency Ordered Stop   05/25/16 2115  levofloxacin (LEVAQUIN)  IVPB 500 mg     500 mg 100 mL/hr over 60 Minutes Intravenous Every 48 hours 05/25/16 1915 06/02/16 2114   05/25/16 1800  cefTRIAXone (ROCEPHIN) 1 g in dextrose 5 % 50 mL IVPB     1 g 100 mL/hr over 30 Minutes Intravenous  Once 05/25/16 1752 05/25/16 1917        Subjective: pain well controlled  Objective: Physical Exam: Vitals:   05/26/16 0115 05/26/16 0434 05/26/16 0750 05/26/16 1624  BP: (!) 120/44 (!) 109/51 (!) 118/47 (!) 105/44  Pulse: (!) 104 (!) 104 99 88  Resp: 17 18 18 18   Temp: 98.2 F (36.8 C) 97.7 F (36.5 C) 98.5  F (36.9 C) 97.9 F (36.6 C)  TempSrc: Oral Oral Oral   SpO2: 90% 91% 90% 94%  Weight:  98.1 kg (216 lb 4.8 oz)    Height:        Intake/Output Summary (Last 24 hours) at 05/26/16 1744 Last data filed at 05/26/16 1700  Gross per 24 hour  Intake             1080 ml  Output             2050 ml  Net             -970 ml   Filed Weights   05/25/16 1545 05/25/16 2108 05/26/16 0434  Weight: 97.1 kg (214 lb) 98 kg (216 lb) 98.1 kg (216 lb 4.8 oz)    General: Alert, Awake and Oriented to Time, Place and Person. Appear in mild distress, affect appropriate Eyes: PERRL, Conjunctiva normal ENT: Oral Mucosa clear moisty. Neck: no JVD, no Abnormal Mass Or lumps Cardiovascular: S1 and S2 Present, no Murmur, Respiratory: Bilateral Air entry equal and Decreased, no use of accessory muscle, no Crackles, bilateral wheezes Abdomen: Bowel Sound present, Soft and no tenderness Skin: no redness, no Rash, no induration Extremities: no Pedal edema, no calf tenderness Neurologic: Grossly no focal neuro deficit. Bilaterally Equal motor strength  Data Reviewed: CBC:  Recent Labs Lab 05/25/16 1558  WBC 16.7*  NEUTROABS 13.0*  HGB 13.3  HCT 40.9  MCV 91.3  PLT 0000000   Basic Metabolic Panel:  Recent Labs Lab 05/25/16 1558 05/25/16 1744 05/26/16 0308  NA 132*  --  136  K 3.0*  --  3.0*  CL 81*  --  85*  CO2 35*  --  40*  GLUCOSE 194*  --  228*  BUN 63*  --  44*  CREATININE 2.07*  --  1.32*  CALCIUM 10.2  --  9.6  MG  --  2.3 2.3    Liver Function Tests: No results for input(s): AST, ALT, ALKPHOS, BILITOT, PROT, ALBUMIN in the last 168 hours. No results for input(s): LIPASE, AMYLASE in the last 168 hours. No results for input(s): AMMONIA in the last 168 hours. Coagulation Profile:  Recent Labs Lab 05/25/16 1558  INR 1.05   Cardiac Enzymes:  Recent Labs Lab 05/25/16 1558  TROPONINI 0.03*   BNP (last 3 results)  Recent Labs  05/19/16 1059  PROBNP 47     CBG:  Recent Labs Lab 05/25/16 2111 05/26/16 0540 05/26/16 1140 05/26/16 1620  GLUCAP 222* 208* 279* 224*    Studies: Ct Hip Left Wo Contrast  Result Date: 05/25/2016 CLINICAL DATA:  Fall, left hip injury, left hip pain. EXAM: CT OF THE LEFT HIP WITHOUT CONTRAST TECHNIQUE: Multidetector CT imaging of the left hip was performed according to the standard protocol. Multiplanar  CT image reconstructions were also generated. COMPARISON:  Radiographs from 05/25/2016 FINDINGS: Bones/Joint/Cartilage Lower lumbar facet arthropathy at L4-5 and L5-S1, considerable central narrowing of the thecal sac at L4-5 due to disc bulge in the underlying facet arthropathy. I do not see a definite hip fracture on the left. There is some spurring and sclerosis between the fourth and fifth sacral segments partially included on the sagittal projections such as image 1/204. No definite left hip joint effusion. Articular cartilage in the left hip is preserved. Sacrococcygeal junction Ligaments Suboptimally assessed by CT. Muscles and Tendons Minimal thickening along the iliotibial band but without masslike hematoma. Soft tissues Sigmoid diverticulosis. Iliac artery atherosclerotic vascular disease. IMPRESSION: 1. Considerable central narrowing of the thecal sac at L4-5 due to disc bulge and facet arthropathy. 2. There is some mild thickening of the iliotibial band which could reflect a low-grade injury, but there is no mass like hematoma or characteristic Sherry Ruffing lesion. 3. Atherosclerosis. 4. Sigmoid diverticulosis. Electronically Signed   By: Van Clines M.D.   On: 05/25/2016 18:22     Scheduled Meds: . atorvastatin  40 mg Oral QHS  . buPROPion  150 mg Oral BH-q7a  . darifenacin  7.5 mg Oral Daily  . DULoxetine  60 mg Oral q morning - 10a  . gabapentin  600 mg Oral TID  . insulin aspart  0-15 Units Subcutaneous TID WC  . insulin aspart  0-5 Units Subcutaneous QHS  . insulin aspart  3 Units  Subcutaneous TID WC  . insulin glargine  60 Units Subcutaneous Daily  . levofloxacin (LEVAQUIN) IV  500 mg Intravenous Q48H  . methylPREDNISolone (SOLU-MEDROL) injection  60 mg Intravenous Q6H  . mometasone-formoterol  2 puff Inhalation BID  . multivitamin with minerals  1 tablet Oral q morning - 10a  . pantoprazole  40 mg Oral Daily  . polyethylene glycol  17 g Oral Daily  . rivaroxaban  15 mg Oral Q supper  . roflumilast  500 mcg Oral Daily  . sodium chloride flush  3 mL Intravenous Q12H  . verapamil  360 mg Oral Daily   Continuous Infusions: PRN Meds: acetaminophen, ALPRAZolam, benzonatate, HYDROcodone-acetaminophen, ipratropium-albuterol, morphine injection, ondansetron **OR** ondansetron (ZOFRAN) IV  Time spent: 30 minutes  Author: Berle Mull, MD [facility] Pager: [phone] 05/26/2016 5:44 PM  If 7PM-7AM, please contact night-coverage at [email or web address], password [facility]

## 2016-05-26 NOTE — Progress Notes (Signed)
Pt remains alert and orient, assisted to [facility] and recliner, K-Pad under patient hip while in chair, NAD noted.

## 2016-05-26 NOTE — Progress Notes (Addendum)
Pt resting quietly.

## 2016-05-27 LAB — GLUCOSE, CAPILLARY
GLUCOSE-CAPILLARY: 191 mg/dL — AB (ref 65–99)
GLUCOSE-CAPILLARY: 285 mg/dL — AB (ref 65–99)
GLUCOSE-CAPILLARY: 156 mg/dL — AB (ref 65–99)
Glucose-Capillary: 172 mg/dL — ABNORMAL HIGH (ref 65–99)

## 2016-05-27 LAB — CBC WITH DIFFERENTIAL/PLATELET
Basophils Absolute: 0 10*3/uL (ref 0.0–0.1)
Basophils Relative: 0 %
EOS ABS: 0 10*3/uL (ref 0.0–0.7)
EOS PCT: 0 %
HCT: 41.4 % (ref 36.0–46.0)
Hemoglobin: 13 g/dL (ref 12.0–15.0)
LYMPHS ABS: 1 10*3/uL (ref 0.7–4.0)
Lymphocytes Relative: 7 %
MCH: 29.2 pg (ref 26.0–34.0)
MCHC: 31.4 g/dL (ref 30.0–36.0)
MCV: 93 fL (ref 78.0–100.0)
MONOS PCT: 5 %
Monocytes Absolute: 0.7 10*3/uL (ref 0.1–1.0)
Neutro Abs: 12.7 10*3/uL — ABNORMAL HIGH (ref 1.7–7.7)
Neutrophils Relative %: 88 %
PLATELETS: 174 10*3/uL (ref 150–400)
RBC: 4.45 MIL/uL (ref 3.87–5.11)
RDW: 14.7 % (ref 11.5–15.5)
WBC: 14.5 10*3/uL — ABNORMAL HIGH (ref 4.0–10.5)

## 2016-05-27 LAB — HEMOGLOBIN A1C
Hgb A1c MFr Bld: 5.9 % — ABNORMAL HIGH (ref 4.8–5.6)
MEAN PLASMA GLUCOSE: 123 mg/dL

## 2016-05-27 LAB — COMPREHENSIVE METABOLIC PANEL
ALT: 22 U/L (ref 14–54)
ANION GAP: 9 (ref 5–15)
AST: 27 U/L (ref 15–41)
Albumin: 3.3 g/dL — ABNORMAL LOW (ref 3.5–5.0)
Alkaline Phosphatase: 57 U/L (ref 38–126)
BUN: 29 mg/dL — ABNORMAL HIGH (ref 6–20)
CALCIUM: 9.8 mg/dL (ref 8.9–10.3)
CHLORIDE: 91 mmol/L — AB (ref 101–111)
CO2: 39 mmol/L — AB (ref 22–32)
Creatinine, Ser: 1.03 mg/dL — ABNORMAL HIGH (ref 0.44–1.00)
GFR calc non Af Amer: 53 mL/min — ABNORMAL LOW (ref >60)
Glucose, Bld: 174 mg/dL — ABNORMAL HIGH (ref 65–99)
Potassium: 4.4 mmol/L (ref 3.5–5.1)
SODIUM: 139 mmol/L (ref 135–145)
Total Bilirubin: 0.6 mg/dL (ref 0.3–1.2)
Total Protein: 6.5 g/dL (ref 6.5–8.1)

## 2016-05-27 LAB — MAGNESIUM: Magnesium: 2.5 mg/dL — ABNORMAL HIGH (ref 1.7–2.4)

## 2016-05-27 LAB — UREA NITROGEN, URINE: UREA NITROGEN UR: 756 mg/dL

## 2016-05-27 MED ORDER — FUROSEMIDE 10 MG/ML IJ SOLN
20.0000 mg | Freq: Once | INTRAMUSCULAR | Status: AC
  Administered 2016-05-27: 20 mg via INTRAVENOUS
  Filled 2016-05-27: qty 2

## 2016-05-27 MED ORDER — ALBUTEROL SULFATE (2.5 MG/3ML) 0.083% IN NEBU
INHALATION_SOLUTION | RESPIRATORY_TRACT | Status: AC
  Filled 2016-05-27: qty 3

## 2016-05-27 MED ORDER — FUROSEMIDE 10 MG/ML IJ SOLN: 40.0000 mg | Freq: Once | INTRAMUSCULAR | Status: DC

## 2016-05-27 MED ORDER — LEVOFLOXACIN IN D5W 500 MG/100ML IV SOLN
500.0000 mg | INTRAVENOUS | Status: DC
  Administered 2016-05-27 – 2016-05-28 (×2): 500 mg via INTRAVENOUS
  Filled 2016-05-27 (×2): qty 100

## 2016-05-27 NOTE — Progress Notes (Signed)
Nutrition Brief Note  RD consult received from COPD protocol. Per pt her weight has been stable. She has had no changes in her appetite. Pt has been eating 100% of her meals.   Lab Results  Component Value Date   HGBA1C 5.9 (H) 05/26/2016    Wt Readings from Last 15 Encounters:  05/27/16 216 lb 11.2 oz (98.3 kg)  05/19/16 222 lb (100.7 kg)  04/29/16 223 lb 6.4 oz (101.3 kg)  04/18/16 222 lb (100.7 kg)  03/10/16 217 lb 12.8 oz (98.8 kg)  03/07/16 216 lb 12.8 oz (98.3 kg)  02/10/16 222 lb 0.1 oz (100.7 kg)  02/08/16 218 lb (98.9 kg)  01/25/16 218 lb 4 oz (99 kg)  01/22/16 216 lb 0.8 oz (98 kg)  12/12/15 216 lb 12 oz (98.3 kg)  11/19/15 218 lb (98.9 kg)  11/12/15 218 lb 6.4 oz (99.1 kg)  10/16/15 218 lb (98.9 kg)  08/17/15 210 lb (95.3 kg)    Body mass index is 36.06 kg/m. Patient meets criteria for obesity class II based on current BMI.   Current diet order is Heart Healthy/CHO Modified, patient is consuming approximately 100% of meals at this time. Labs and medications reviewed.   No nutrition interventions warranted at this time. If nutrition issues arise, please consult RD.   Spencer, Daisy, [place] Pager [phone] After Hours Pager

## 2016-05-27 NOTE — Care Management Note (Signed)
Case Management Note  Patient Details  Name: Destiny Shelton MRN: [id number] Date of Birth: July 20, 1945  Subjective/Objective:       Admitted with COPD             Action/Plan: Patient lives at home with spouse; PCP is Dr Marton Redwood; has private insurance with Medicare / [facility] with prescription drug coverage; pharmacy of choice is [facility]; DME - home 02, nebulizer machine, rollater ( rolling walker with seat); patient could benefit from a Disease Management Program for COPD; [place] choice offered, spouse chose [place]; Hoyle Sauer with [facility] called for arrangements; Attending MD at discharge please enter the face to face in Epic for [facility] services.  Expected Discharge Date:    possibly 05/30/2016              Expected Discharge Plan:  [place]  Discharge planning Services  CM Consult   Choice offered to:  Spouse, Patient  HH Arranged:  RN, Disease Management, PT, Nurse's Aide [place] Agency:  [place]  Status of Service:  In process, will continue to follow  Sherrilyn Rist [phone] 05/27/2016, 3:19 PM

## 2016-05-27 NOTE — Evaluation (Signed)
Physical Therapy Evaluation Patient Details Name: Destiny Shelton MRN: [id number] DOB: April 09, 1946 Today's Date: 05/27/2016   History of Present Illness  Pt. with PMH of COPD, type II DM, HTN, PAF, chronic respiratory failure on 5 L of oxygen; admitted on 05/25/2016, with complaint of shortness of breath, was found to have COPD exacerbation  Clinical Impression  Patient seen for mobility evaluation. Patient demonstrates deficits in functional mobility as indicated below. Will need continued skilled PT to address deficits and maximize function. Will see as indicated and progress as tolerated.  OF NOTE: Patient desaturates to 76% on 5 liters when going from [facility] to chair. 4/4 DOE noted with audible breath sounds and >4 minutes to rebound to mid 80's. HR 120s with activity. Nsg aware.  Patient also with palpable bump under her skin on her back that she reports is very tender. Nsg aware.     Follow Up Recommendations Home health PT;Supervision/Assistance - 24 hour    Equipment Recommendations  None recommended by PT    Recommendations for Other Services       Precautions / Restrictions Precautions Precautions: Fall Restrictions Weight Bearing Restrictions: No      Mobility  Bed Mobility Overal bed mobility: Needs Assistance Bed Mobility: Supine to Sit     Supine to sit: Modified independent (Device/Increase time)     General bed mobility comments: use of bed rail, no physical assist required  Transfers Overall transfer level: Needs assistance Equipment used: 1 person hand held assist Transfers: Sit to/from Stand Sit to Stand: Min guard         General transfer comment: min guard for stability and safety  Ambulation/Gait Ambulation/Gait assistance: Min guard Ambulation Distance (Feet): 12 Feet Assistive device: 1 person hand held assist Gait Pattern/deviations: Step-through pattern;Decreased stride length;Trunk flexed Gait velocity: decreased   General Gait Details:  patient with increased DOE upon very limited ambulation from [facility] around to chair. Patient with audible breath sounds  Stairs            Wheelchair Mobility    Modified Rankin (Stroke Patients Only)       Balance Overall balance assessment: History of Falls                                           Pertinent Vitals/Pain Pain Assessment: 0-10 Pain Score: 5  Pain Location: back Pain Descriptors / Indicators: Sore;Tender Pain Intervention(s): Monitored during session    Home Living Family/patient expects to be discharged to:: Private residence Living Arrangements: Spouse/significant other Available Help at Discharge: Family;Available 24 hours/day Type of Home: House Home Access: Level entry     Home Layout: One level Home Equipment: Walker - 4 wheels;Cane - single point;Shower seat;Wheelchair - Education administrator (comment) Additional Comments: home O2    Prior Function Level of Independence: Independent with assistive device(s)         Comments: uses 4 wheeled walked at home     Hand Dominance   Dominant Hand: Right    Extremity/Trunk Assessment   Upper Extremity Assessment Upper Extremity Assessment: Generalized weakness    Lower Extremity Assessment Lower Extremity Assessment: Generalized weakness    Cervical / Trunk Assessment Cervical / Trunk Assessment:  (increased body habitus)  Communication   Communication: No difficulties  Cognition Arousal/Alertness: Awake/alert Behavior During Therapy: WFL for tasks assessed/performed Overall Cognitive Status: Within Functional Limits for tasks assessed  General Comments      Exercises     Assessment/Plan    PT Assessment Patient needs continued PT services  PT Problem List Decreased strength;Decreased activity tolerance;Decreased balance;Decreased mobility;Cardiopulmonary status limiting activity;Obesity;Pain          PT Treatment Interventions DME  instruction;Gait training;Functional mobility training;Therapeutic activities;Therapeutic exercise;Balance training;Neuromuscular re-education;Patient/family education    PT Goals (Current goals can be found in the Care Plan section)  Acute Rehab PT Goals Patient Stated Goal: to walk PT Goal Formulation: With patient Time For Goal Achievement: 06/10/16 Potential to Achieve Goals: Fair    Frequency Min 3X/week   Barriers to discharge        Co-evaluation               End of Session Equipment Utilized During Treatment: Gait belt;Oxygen Activity Tolerance: Treatment limited secondary to medical complications (Comment) (desaturations to 76% with extremely limited activity) Patient left: in chair;with call bell/phone within reach;with family/visitor present Nurse Communication: Mobility status         Time: [id number] PT Time Calculation (min) (ACUTE ONLY): 21 min   Charges:   PT Evaluation $PT Eval Moderate Complexity: 1 Procedure     PT G Codes:        Duncan Dull 06/06/16, 11:04 AM Alben Deeds, PT DPT  [phone]

## 2016-05-27 NOTE — Consult Note (Signed)
   [facility] Inpatient Consult   05/27/2016  A[facility] 1945/09/11 [id number]  Met with the patient at the bedside.  Patient is COPD Gold.  Admitted for COPD exacerbation.  Patient and her husband, Wynetta Emery,  at the bedside. Explained [place] Management for post hospital follow up.  Patient denies any transportation needs, pharmacy concerns., at this time.  Patient could benefit from [facility] follow up.  Patient states she did not feel a home visit was necessary as she has some home health care.  She [facility]. Patient has been set up with EMMI calls by inpatient [facility] for COPD follow up. [facility] Care Management will follow as needed.  For questions, please contact:  Natividad Brood, RN BSN [place] [facility] Liaison  [phone] business mobile phone Toll free office [phone]

## 2016-05-27 NOTE — Progress Notes (Signed)
[facility] Progress Note  Patient: Destiny Shelton [id number]   PCP: Marton Redwood, MD DOB: 13-Jul-1945   DOA: 05/25/2016   DOS: 05/27/2016   Date of Service: the patient was seen and examined on 05/27/2016  Brief hospital course: Pt. with PMH of COPD, type II DM, HTN, PAF, chronic respiratory failure on 5 L of oxygen; admitted on 05/25/2016, with complaint of shortness of breath, was found to have COPD exacerbation.Patient remained significantly hypoxic with saturation dropping to 76% on exertion despite on 5 L of oxygen Currently further plan is continue current management.  Assessment and Plan: 1. COPD exacerbation likely viral bronchitis Continue IV Levaquin, continue duo nebs, continue steroids. Check influenza PCR Negative blood cultures so far. With Profound hypoxia at present I will give 1 dose of IV Lasix and monitor renal function. May require pulmonary input  2. Acute kidney injury. Baseline less than 1, on presentation to 0.07. Improving with stopping of diuretics. Likely due to Zaroxolyn initiation At present we will give 1 dose of IV Lasix and monitor renal function.  3. Hypokalemia  - Serum potassium is 3.0 on admission with normal mag level Resolved  4. Chronic diastolic CHF  - Pt appears dry on admission and was given 500 cc NS in ED  - TTE (07/17/15) with EF 55-60%, mild LVH, grade 1 diastolic CHF, mild MR, and mild LAE  She was given gentle IV fluids, significant improvement in renal function after stopping ARB as well as Lasix. This is likely in the setting of use of Zaroxolyn. Given the patient has worsening hypoxia at present I would give 1 dose of IV Lasix and monitor.  5. Paroxysmal atrial fibrillation  - Pt is in sinus rhythm on admission  - CHAD-VASc is 36 (age, gender, CHF, HTN)  - Continue anticoagulation with Xarelto,  - Continue verapamil for rate-control    6. Insulin-dependent DM  - A1c was 6.4% in 2013  - She is managed with Lantus  66 units qD and Humalog 8 units BID at home - Check CBG with meals and qHS  - Continue Lantus at 60 units qD with Novololg 3 units with meals and per moderate-intensity sliding-scale   7. Fall with left hip pain   - Pt suffered a mechanical fall at home just PTA and struck left hip on bathtub; no head strike or LOC  - Radiographs negative for acute fracture  - CT obtained given persistent pain and difficulty ambulating; this is notable for possible low-grade IT band injury, but no fracture or dislocation  - Pain-control prn; PT eval requested    8. Hyponatremia  Resolved with fluids  9. Hypertension - BP has normal - Continue verapamil as tolerated for rate-control   Pain management: PRN tylenol Activity: Home health per physical therapy Bowel regimen: last BM 05/26/2016 Diet: cardiac diet DVT Prophylaxis: xaerlto  Advance goals of care discussion: full code  Family Communication: family was present at bedside, at the time of interview. The pt provided permission to discuss medical plan with the family. Opportunity was given to ask question and all questions were answered satisfactorily.   Disposition:  Discharge to home. Expected discharge date: 05/29/2016,   Consultants: none Procedures: none  Antibiotics: Anti-infectives    Start     Dose/Rate Route Frequency Ordered Stop   05/27/16 1200  levofloxacin (LEVAQUIN) IVPB 500 mg     500 mg 100 mL/hr over 60 Minutes Intravenous Every 24 hours 05/27/16 1105 06/02/16 2359   05/25/16 2115  levofloxacin (LEVAQUIN) IVPB 500 mg  Status:  Discontinued     500 mg 100 mL/hr over 60 Minutes Intravenous Every 48 hours 05/25/16 1915 05/27/16 1105   05/25/16 1800  cefTRIAXone (ROCEPHIN) 1 g in dextrose 5 % 50 mL IVPB     1 g 100 mL/hr over 30 Minutes Intravenous  Once 05/25/16 1752 05/25/16 1917      Subjective: Left hip pain is resolved. Denies any shortness of breath at baseline at rest, significantly short of breath on exertion  today.  Objective: Physical Exam: Vitals:   05/27/16 0505 05/27/16 0724 05/27/16 0800 05/27/16 1200  BP: (!) 115/50  (!) 120/52 (!) 122/52  Pulse: 96 92 95 99  Resp: 18 17 18 18   Temp: 98.4 F (36.9 C)  98.5 F (36.9 C) 98.2 F (36.8 C)  TempSrc: Oral  Oral Oral  SpO2: 93% 94% 94% 95%  Weight: 98.3 kg (216 lb 11.2 oz)     Height:        Intake/Output Summary (Last 24 hours) at 05/27/16 1618 Last data filed at 05/27/16 1100  Gross per 24 hour  Intake             1080 ml  Output             1960 ml  Net             -880 ml   Filed Weights   05/25/16 2108 05/26/16 0434 05/27/16 0505  Weight: 98 kg (216 lb) 98.1 kg (216 lb 4.8 oz) 98.3 kg (216 lb 11.2 oz)    General: Alert, Awake and Oriented to Time, Place and Person. Appear in mild distress, affect appropriate Eyes: PERRL, Conjunctiva normal ENT: Oral Mucosa clear moisty. Neck: no JVD, no Abnormal Mass Or lumps Cardiovascular: S1 and S2 Present, no Murmur, Respiratory: Bilateral Air entry equal and Decreased, no use of accessory muscle, no Crackles, bilateral wheezes Abdomen: Bowel Sound present, Soft and no tenderness Skin: no redness, no Rash, no induration Extremities: no Pedal edema, no calf tenderness Neurologic: Grossly no focal neuro deficit. Bilaterally Equal motor strength  Data Reviewed: CBC:  Recent Labs Lab 05/25/16 1558 05/27/16 0347  WBC 16.7* 14.5*  NEUTROABS 13.0* 12.7*  HGB 13.3 13.0  HCT 40.9 41.4  MCV 91.3 93.0  PLT 195 [id number]   Basic Metabolic Panel:  Recent Labs Lab 05/25/16 1558 05/25/16 1744 05/26/16 0308 05/27/16 0347  NA 132*  --  136 139  K 3.0*  --  3.0* 4.4  CL 81*  --  85* 91*  CO2 35*  --  40* 39*  GLUCOSE 194*  --  228* 174*  BUN 63*  --  44* 29*  CREATININE 2.07*  --  1.32* 1.03*  CALCIUM 10.2  --  9.6 9.8  MG  --  2.3 2.3 2.5*    Liver Function Tests:  Recent Labs Lab 05/27/16 0347  AST 27  ALT 22  ALKPHOS 57  BILITOT 0.6  PROT 6.5  ALBUMIN 3.3*   No  results for input(s): LIPASE, AMYLASE in the last 168 hours. No results for input(s): AMMONIA in the last 168 hours. Coagulation Profile:  Recent Labs Lab 05/25/16 1558  INR 1.05   Cardiac Enzymes:  Recent Labs Lab 05/25/16 1558  TROPONINI 0.03*   BNP (last 3 results)  Recent Labs  05/19/16 1059  PROBNP 47    CBG:  Recent Labs Lab 05/26/16 1140 05/26/16 1620 05/26/16 2103 05/27/16 0609 05/27/16 1129  GLUCAP 279*  224* 214* 191* 285*    Studies: No results found.   Scheduled Meds: . atorvastatin  40 mg Oral QHS  . buPROPion  150 mg Oral BH-q7a  . darifenacin  7.5 mg Oral Daily  . DULoxetine  60 mg Oral q morning - 10a  . furosemide  20 mg Intravenous Once  . gabapentin  600 mg Oral TID  . insulin aspart  0-15 Units Subcutaneous TID WC  . insulin aspart  0-5 Units Subcutaneous QHS  . insulin aspart  3 Units Subcutaneous TID WC  . insulin glargine  60 Units Subcutaneous Daily  . levofloxacin (LEVAQUIN) IV  500 mg Intravenous Q24H  . methylPREDNISolone (SOLU-MEDROL) injection  60 mg Intravenous Q6H  . mometasone-formoterol  2 puff Inhalation BID  . multivitamin with minerals  1 tablet Oral q morning - 10a  . pantoprazole  40 mg Oral Daily  . polyethylene glycol  17 g Oral Daily  . rivaroxaban  15 mg Oral Q supper  . roflumilast  500 mcg Oral Daily  . sodium chloride flush  3 mL Intravenous Q12H  . verapamil  360 mg Oral Daily   Continuous Infusions: PRN Meds: acetaminophen, ALPRAZolam, benzonatate, HYDROcodone-acetaminophen, ipratropium-albuterol, morphine injection, ondansetron **OR** ondansetron (ZOFRAN) IV  Time spent: 30 minutes  Author: Berle Mull, MD [facility] Pager: [phone] 05/27/2016 4:18 PM  If 7PM-7AM, please contact night-coverage at [email or web address], password [facility]

## 2016-05-28 LAB — COMPREHENSIVE METABOLIC PANEL
ALK PHOS: 52 U/L (ref 38–126)
ALT: 21 U/L (ref 14–54)
AST: 21 U/L (ref 15–41)
Albumin: 2.9 g/dL — ABNORMAL LOW (ref 3.5–5.0)
Anion gap: 7 (ref 5–15)
BUN: 27 mg/dL — ABNORMAL HIGH (ref 6–20)
CALCIUM: 9 mg/dL (ref 8.9–10.3)
CHLORIDE: 93 mmol/L — AB (ref 101–111)
CO2: 40 mmol/L — ABNORMAL HIGH (ref 22–32)
CREATININE: 0.84 mg/dL (ref 0.44–1.00)
Glucose, Bld: 170 mg/dL — ABNORMAL HIGH (ref 65–99)
Potassium: 3.4 mmol/L — ABNORMAL LOW (ref 3.5–5.1)
Sodium: 140 mmol/L (ref 135–145)
TOTAL PROTEIN: 5.9 g/dL — AB (ref 6.5–8.1)
Total Bilirubin: 0.4 mg/dL (ref 0.3–1.2)

## 2016-05-28 LAB — CBC WITH DIFFERENTIAL/PLATELET
Basophils Absolute: 0 10*3/uL (ref 0.0–0.1)
Basophils Relative: 0 %
EOS PCT: 0 %
Eosinophils Absolute: 0 10*3/uL (ref 0.0–0.7)
HCT: 40.8 % (ref 36.0–46.0)
Hemoglobin: 13 g/dL (ref 12.0–15.0)
LYMPHS ABS: 1 10*3/uL (ref 0.7–4.0)
LYMPHS PCT: 9 %
MCH: 29.3 pg (ref 26.0–34.0)
MCHC: 31.9 g/dL (ref 30.0–36.0)
MCV: 91.9 fL (ref 78.0–100.0)
Monocytes Absolute: 0.4 10*3/uL (ref 0.1–1.0)
Monocytes Relative: 4 %
Neutro Abs: 9.9 10*3/uL — ABNORMAL HIGH (ref 1.7–7.7)
Neutrophils Relative %: 87 %
PLATELETS: 176 10*3/uL (ref 150–400)
RBC: 4.44 MIL/uL (ref 3.87–5.11)
RDW: 14.5 % (ref 11.5–15.5)
WBC: 11.4 10*3/uL — AB (ref 4.0–10.5)

## 2016-05-28 LAB — GLUCOSE, CAPILLARY
GLUCOSE-CAPILLARY: 281 mg/dL — AB (ref 65–99)
Glucose-Capillary: 160 mg/dL — ABNORMAL HIGH (ref 65–99)

## 2016-05-28 LAB — MAGNESIUM: MAGNESIUM: 2.5 mg/dL — AB (ref 1.7–2.4)

## 2016-05-28 LAB — INFLUENZA PANEL BY PCR (TYPE A & B)
Influenza A By PCR: NEGATIVE
Influenza B By PCR: NEGATIVE

## 2016-05-28 MED ORDER — LEVOFLOXACIN 500 MG PO TABS: 500.0000 mg | ORAL_TABLET | Freq: Every day | ORAL | 0 refills | Status: AC

## 2016-05-28 MED ORDER — FUROSEMIDE 40 MG PO TABS: 80.0000 mg | ORAL_TABLET | Freq: Every day | ORAL | Status: DC

## 2016-05-28 MED ORDER — PREDNISONE 20 MG PO TABS: ORAL_TABLET | ORAL | 0 refills | Status: DC

## 2016-05-28 MED ORDER — POTASSIUM CHLORIDE CRYS ER 20 MEQ PO TBCR
40.0000 meq | EXTENDED_RELEASE_TABLET | Freq: Once | ORAL | Status: AC
  Administered 2016-05-28: 40 meq via ORAL
  Filled 2016-05-28: qty 2

## 2016-05-28 NOTE — Progress Notes (Signed)
Patient given discharge instructions and all questions answered.  Pt. Discharged via wheelchair with all belongings.   

## 2016-05-28 NOTE — Discharge Summary (Signed)
Physician Discharge Summary  Destiny Shelton [facility] [id number] DOB: 24-May-1946 DOA: 05/25/2016  PCP: Marton Redwood, MD  Admit date: 05/25/2016 Discharge date: 05/28/2016  Admitted From: Home  Disposition: Home   Recommendations for Outpatient Follow-up:  1. Follow up with PCP in 1 weeks 2. Follow up with cardiologist tomorrow as scheduled 12/28 3. Follow up with pulmonologist in 2-3 weeks 4. Please obtain BMP/CBC in one week 5. Please evaluate lasix and avapro as had significant renal failure on admission (now resolved)   Home Health: YES, HHPT, RN Equipment/Devices: Resume home oxygen  [place] 5L/m  Discharge Condition: STABLE CODE STATUS: FULL  Diet recommendation: Heart Healthy / Carb Modified  Brief/Interim Summary: Brief hospital course: Pt. with PMH of COPD, type II DM, HTN, PAF, chronic respiratory failure on 5 L of oxygen; admitted on 05/25/2016, with complaint of shortness of breath, was found to have COPD exacerbation.Patient remained significantly hypoxic with saturation dropping to 76% on exertion despite on 5 L of oxygen.    Assessment and Plan: 1. COPD exacerbation likely viral bronchitis Continue IV Levaquin, continue duo nebs, continue steroids. Influenza PCR negative Negative blood cultures so far. GOLD COPD Home Management Program in Place  Discharge home on oral prednisone taper, levofloxacin x 5 more days, close follow up with PCP, cardiologist and pulmonologist  2. Acute kidney injury. Baseline less than 1, on presentation to 0.07. Improved.    3. Hypokalemia  - Serum potassium is 3.0 on admission with normal mag level, repleted.   4. Chronic diastolic CHF  - Pt appears dry on admission and was given 500 cc NS in ED  - TTE (07/17/15) with EF 55-60%, mild LVH, grade 1 diastolic CHF, mild MR, and mild LAE  She was given gentle IV fluids, significant improvement in renal function after stopping ARB as well as Lasix.  5. Paroxysmal atrial fibrillation  -  Pt is in sinus rhythm on admission  - CHAD-VASc is 29 (age, gender, CHF, HTN)  - Continue anticoagulation with Xarelto,  - Continue verapamil for rate-control   6. Insulin-dependent DM  - A1c was 6.4% in 2013  - She is managed with Lantus 66 units qD and Humalog 8 units BID at home - Check CBG with meals and qHS  - Continue Lantus at 60 units qD with Novololg 3 units with meals and per moderate-intensity sliding-scale  CBG (last 3)   Recent Labs (last 2 labs)    Recent Labs  05/27/16 1632 05/27/16 2101 05/28/16 0545  GLUCAP 156* 172* 160*      7. Fall with left hip pain  - Pt suffered a mechanical fall at home just PTA and struck left hip on bathtub; no head strike or LOC  - Radiographs negative for acute fracture  - CT obtained given persistent pain and difficulty ambulating; this is notable for possible low-grade IT band injury, but no fracture or dislocation  - Pain-control prn; PT eval recommending HHPT  8. Hyponatremia  Resolved with fluids  9.Hypertension - BP has normal - Continue verapamil for rate-control   Pain management: PRN tylenol Activity: Home health per physical therapy Diet: cardiac diet DVT Prophylaxis: xarelto  Advance goals of care discussion: full code  Family Communication: family was present at bedside, at the time of interview. The pt provided permission to discuss medical plan with the family. Opportunity was given to ask question and all questions were answered satisfactorily.   Disposition:  Discharge to home. Expected discharge date: 05/29/2016    Discharge Diagnoses:  Principal Problem:   COPD exacerbation ([place]) Active Problems:   Diabetes mellitus, type II, insulin dependent (HCC)   Depression   Essential hypertension   Centrilobular emphysema (HCC)   PAF (paroxysmal atrial fibrillation) (HCC)   Hypokalemia   Chronic respiratory failure (HCC)   Chronic diastolic CHF (congestive heart failure) (HCC)   AKI (acute  kidney injury) ([place])   Hyponatremia   Metabolic acidosis   Acute hip pain, left   COPD with acute exacerbation [facility])   Fall   Discharge Instructions  Discharge Instructions    Diet - low sodium heart healthy    Complete by:  As directed    Increase activity slowly    Complete by:  As directed      Allergies as of 05/28/2016      Reactions   Aspirin Shortness Of Breath   Nsaids Shortness Of Breath      Medication List    TAKE these medications   acetaminophen 500 MG tablet Commonly known as:  TYLENOL Take 1,000 mg by mouth every 6 (six) hours as needed for mild pain or headache.   albuterol (2.5 MG/3ML) 0.083% nebulizer solution Commonly known as:  PROVENTIL Take 3 mLs (2.5 mg total) by nebulization every 6 (six) hours as needed for wheezing or shortness of breath.   ALPRAZolam 0.5 MG tablet Commonly known as:  XANAX Take 0.25 mg by mouth at bedtime as needed for sleep.   ARTIFICIAL TEARS 1.4 % ophthalmic solution Generic drug:  polyvinyl alcohol Place 1 drop into both eyes daily.   atorvastatin 40 MG tablet Commonly known as:  LIPITOR Take 40 mg by mouth at bedtime.   benzonatate 200 MG capsule Commonly known as:  TESSALON Take 200 mg by mouth at bedtime.   buPROPion 300 MG 24 hr tablet Commonly known as:  WELLBUTRIN XL Take 300 mg by mouth daily.   docusate sodium 100 MG capsule Commonly known as:  COLACE Take 100 mg by mouth 2 (two) times daily as needed for mild constipation.   DULoxetine 60 MG capsule Commonly known as:  CYMBALTA Take 60 mg by mouth daily.   furosemide 40 MG tablet Commonly known as:  LASIX Take 2 tablets (80 mg total) by mouth daily. What changed:  when to take this   gabapentin 600 MG tablet Commonly known as:  NEURONTIN Take 600 mg by mouth 3 (three) times daily.   HYDROcodone-acetaminophen 5-325 MG tablet Commonly known as:  NORCO/VICODIN Take 1 tablet by mouth every 4 (four) hours as needed for moderate pain. Reported  on 12/12/2015   insulin glargine 100 UNIT/ML injection Commonly known as:  LANTUS Inject 0.66 mLs (66 Units total) into the skin daily before breakfast. What changed:  how much to take   insulin lispro 100 UNIT/ML injection Commonly known as:  HUMALOG Inject 6 Units into the skin See admin instructions. Inject 6 units subcutaneously twice daily (with breakfast and supper) if CBG >150   irbesartan 150 MG tablet Commonly known as:  AVAPRO Take 1 tablet (150 mg total) by mouth daily. PLEASE CONTACT OFFICE FOR ADDITIONAL REFILLS   iron polysaccharides 150 MG capsule Commonly known as:  NIFEREX Take 1 capsule (150 mg total) by mouth 2 (two) times daily.   levalbuterol 45 MCG/ACT inhaler Commonly known as:  XOPENEX HFA Inhale 1-2 puffs into the lungs every 4 (four) hours as needed for wheezing.   levofloxacin 500 MG tablet Commonly known as:  LEVAQUIN Take 1 tablet (500 mg total) by  mouth daily.   MUCINEX MAXIMUM STRENGTH 1200 MG Tb12 Generic drug:  Guaifenesin Take 1,200 mg by mouth every 12 (twelve) hours.   multivitamin with minerals Tabs tablet Take 1 tablet by mouth daily.   omeprazole 20 MG capsule Commonly known as:  PRILOSEC Take 20 mg by mouth daily after supper.   ONE TOUCH ULTRA TEST test strip Generic drug:  glucose blood 1 each by Other route 3 (three) times daily.   OXYGEN Inhale 5 L into the lungs continuous.   polyethylene glycol packet Commonly known as:  MIRALAX / GLYCOLAX Take 17 g by mouth daily. Mix in 8 oz liquid and drink   potassium chloride 10 MEQ tablet Commonly known as:  K-DUR,KLOR-CON Take 3 tablets (30 mEq total) by mouth 2 (two) times daily.   predniSONE 20 MG tablet Commonly known as:  DELTASONE Take 3 tabs daily with breakfast for 5 days, then 2 tabs daily with breakfast for 5 days, then 1 tab daily for 5 days, then stop   rivaroxaban 20 MG Tabs tablet Commonly known as:  XARELTO Take 1 tablet (20 mg total) by mouth daily with  supper. Restart on Wednesday May 11 , 2016   roflumilast 500 MCG Tabs tablet Commonly known as:  DALIRESP Take 500 mcg by mouth daily.   solifenacin 5 MG tablet Commonly known as:  VESICARE Take 5 mg by mouth daily.   SYMBICORT 160-4.5 MCG/ACT inhaler Generic drug:  budesonide-formoterol INHALE 2 PUFFS BY MOUTH TWICE DAILY   tiotropium 18 MCG inhalation capsule Commonly known as:  SPIRIVA Place 1 capsule (18 mcg total) into inhaler and inhale every morning. What changed:  when to take this   verapamil 360 MG 24 hr capsule Commonly known as:  VERELAN PM Take 1 capsule (360 mg total) by mouth daily. KEEP OV.      Follow-up Information    [facility] Follow up.   Specialty:  [place] Why:  They will do your home health care at your home Contact information: [place] [address] [phone]        Loralie Champagne, MD. Go on 05/29/2016.   Specialty:  Cardiology Why:  as scheduled Contact information: [address] [place] [address] [phone]        Marton Redwood, MD. Schedule an appointment as soon as possible for a visit in 1 week(s).   Specialty:  Internal Medicine Why:  Hospital Follow Up  Contact information: [place] [address] [phone]        Simonne Maffucci, MD. Schedule an appointment as soon as possible for a visit in 2 week(s).   Specialty:  Pulmonary Disease Why:  Hospital Follow Up  Contact information: [address] [place] [address] [phone]          Allergies  Allergen Reactions  . Aspirin Shortness Of Breath  . Nsaids Shortness Of Breath   Procedures/Studies: Dg Chest 1 View  Result Date: 05/25/2016 CLINICAL DATA:  Fall with left hip pain. EXAM: CHEST 1 VIEW COMPARISON:  02/10/2016 FINDINGS: Hyperinflation. Apical lordotic positioning. Midline trachea. Cardiomegaly accentuated by AP portable technique. Atherosclerosis in the transverse aorta. No  pleural effusion or pneumothorax. Increased peripheral predominant interstitial thickening. Left base volume loss with scarring or subsegmental atelectasis. IMPRESSION: No acute or posttraumatic deformity identified. Cardiomegaly. Increase in peripheral predominant interstitial thickening which in the acute setting could represent mild pulmonary venous congestion. Left base scarring or atelectasis. Electronically Signed  By: Abigail Miyamoto M.D.   On: 05/25/2016 17:23   Ct Hip Left Wo Contrast  Result Date: 05/25/2016 CLINICAL DATA:  Fall, left hip injury, left hip pain. EXAM: CT OF THE LEFT HIP WITHOUT CONTRAST TECHNIQUE: Multidetector CT imaging of the left hip was performed according to the standard protocol. Multiplanar CT image reconstructions were also generated. COMPARISON:  Radiographs from 05/25/2016 FINDINGS: Bones/Joint/Cartilage Lower lumbar facet arthropathy at L4-5 and L5-S1, considerable central narrowing of the thecal sac at L4-5 due to disc bulge in the underlying facet arthropathy. I do not see a definite hip fracture on the left. There is some spurring and sclerosis between the fourth and fifth sacral segments partially included on the sagittal projections such as image 1/204. No definite left hip joint effusion. Articular cartilage in the left hip is preserved. Sacrococcygeal junction Ligaments Suboptimally assessed by CT. Muscles and Tendons Minimal thickening along the iliotibial band but without masslike hematoma. Soft tissues Sigmoid diverticulosis. Iliac artery atherosclerotic vascular disease. IMPRESSION: 1. Considerable central narrowing of the thecal sac at L4-5 due to disc bulge and facet arthropathy. 2. There is some mild thickening of the iliotibial band which could reflect a low-grade injury, but there is no mass like hematoma or characteristic Sherry Ruffing lesion. 3. Atherosclerosis. 4. Sigmoid diverticulosis. Electronically Signed   By: Van Clines M.D.   On: 05/25/2016  18:22   Dg Hip Unilat With Pelvis 2-3 Views Left  Result Date: 05/25/2016 CLINICAL DATA:  Fall.  Pain. EXAM: DG HIP (WITH OR WITHOUT PELVIS) 2-3V LEFT COMPARISON:  None. FINDINGS: AP view of the pelvis and AP/ frog leg views of the left hip. Femoral heads are located. Vascular calcifications. Sacroiliac joints are symmetric. No acute fracture. IMPRESSION: No acute osseous abnormality. Electronically Signed   By: Abigail Miyamoto M.D.   On: 05/25/2016 17:21    Subjective: Pt says she feels much better and ready to go home, says she will see her cardiologist tomorrow already scheduled.    Discharge Exam: Vitals:   05/27/16 2027 05/28/16 0429  BP: (!) 106/56 (!) 112/58  Pulse: 87 94  Resp: 18 18  Temp: 98.4 F (36.9 C) 98.8 F (37.1 C)   Vitals:   05/27/16 2027 05/27/16 2100 05/28/16 0429 05/28/16 1019  BP: (!) 106/56  (!) 112/58   Pulse: 87  94   Resp: 18  18   Temp: 98.4 F (36.9 C)  98.8 F (37.1 C)   TempSrc: Oral  Oral   SpO2: 97% 95% 95% 91%  Weight:   98.7 kg (217 lb 11.2 oz)   Height:        The results of significant diagnostics from this hospitalization (including imaging, microbiology, ancillary and laboratory) are listed below for reference.     Microbiology: Recent Results (from the past 240 hour(s))  Blood culture (routine x 2)     Status: None (Preliminary result)   Collection Time: 05/25/16  3:57 PM  Result Value Ref Range Status   Specimen Description BLOOD LEFT HAND  Final   Special Requests BOTTLES DRAWN AEROBIC AND ANAEROBIC 5 CC  Final   Culture NO GROWTH 2 DAYS  Final   Report Status PENDING  Incomplete  Blood culture (routine x 2)     Status: None (Preliminary result)   Collection Time: 05/25/16  3:58 PM  Result Value Ref Range Status   Specimen Description BLOOD BLOOD RIGHT FOREARM  Final   Special Requests BOTTLES DRAWN AEROBIC AND ANAEROBIC 5CC  Final  Culture NO GROWTH 2 DAYS  Final   Report Status PENDING  Incomplete     Labs: BNP (last 3  results)  Recent Labs  02/10/16 1030 04/18/16 1026 05/25/16 1558  BNP 17.7 20.7 [id number]   Basic Metabolic Panel:  Recent Labs Lab 05/25/16 1558 05/25/16 1744 05/26/16 0308 05/27/16 0347 05/28/16 0545  NA 132*  --  136 139 140  K 3.0*  --  3.0* 4.4 3.4*  CL 81*  --  85* 91* 93*  CO2 35*  --  40* 39* 40*  GLUCOSE 194*  --  228* 174* 170*  BUN 63*  --  44* 29* 27*  CREATININE 2.07*  --  1.32* 1.03* 0.84  CALCIUM 10.2  --  9.6 9.8 9.0  MG  --  2.3 2.3 2.5* 2.5*   Liver Function Tests:  Recent Labs Lab 05/27/16 0347 05/28/16 0545  AST 27 21  ALT 22 21  ALKPHOS 57 52  BILITOT 0.6 0.4  PROT 6.5 5.9*  ALBUMIN 3.3* 2.9*   No results for input(s): LIPASE, AMYLASE in the last 168 hours. No results for input(s): AMMONIA in the last 168 hours. CBC:  Recent Labs Lab 05/25/16 1558 05/27/16 0347 05/28/16 0545  WBC 16.7* 14.5* 11.4*  NEUTROABS 13.0* 12.7* 9.9*  HGB 13.3 13.0 13.0  HCT 40.9 41.4 40.8  MCV 91.3 93.0 91.9  PLT 195 174 176   Cardiac Enzymes:  Recent Labs Lab 05/25/16 1558  TROPONINI 0.03*   BNP: Invalid input(s): POCBNP CBG:  Recent Labs Lab 05/27/16 0609 05/27/16 1129 05/27/16 1632 05/27/16 2101 05/28/16 0545  GLUCAP 191* 285* 156* 172* 160*   D-Dimer No results for input(s): DDIMER in the last 72 hours. Hgb A1c  Recent Labs  05/26/16 0308  HGBA1C 5.9*   Lipid Profile No results for input(s): CHOL, HDL, LDLCALC, TRIG, CHOLHDL, LDLDIRECT in the last 72 hours. Thyroid function studies No results for input(s): TSH, T4TOTAL, T3FREE, THYROIDAB in the last 72 hours.  Invalid input(s): FREET3 Anemia work up No results for input(s): VITAMINB12, FOLATE, FERRITIN, TIBC, IRON, RETICCTPCT in the last 72 hours. Urinalysis    Component Value Date/Time   COLORURINE STRAW (A) 05/25/2016 0205   APPEARANCEUR CLEAR 05/25/2016 0205   LABSPEC 1.010 05/25/2016 0205   PHURINE 6.0 05/25/2016 0205   GLUCOSEU >=500 (A) 05/25/2016 0205   HGBUR  NEGATIVE 05/25/2016 0205   BILIRUBINUR NEGATIVE 05/25/2016 0205   KETONESUR NEGATIVE 05/25/2016 0205   PROTEINUR NEGATIVE 05/25/2016 0205   UROBILINOGEN 0.2 07/09/2013 0058   NITRITE NEGATIVE 05/25/2016 0205   LEUKOCYTESUR NEGATIVE 05/25/2016 0205   Sepsis Labs Invalid input(s): PROCALCITONIN,  WBC,  LACTICIDVEN Microbiology Recent Results (from the past 240 hour(s))  Blood culture (routine x 2)     Status: None (Preliminary result)   Collection Time: 05/25/16  3:57 PM  Result Value Ref Range Status   Specimen Description BLOOD LEFT HAND  Final   Special Requests BOTTLES DRAWN AEROBIC AND ANAEROBIC 5 CC  Final   Culture NO GROWTH 2 DAYS  Final   Report Status PENDING  Incomplete  Blood culture (routine x 2)     Status: None (Preliminary result)   Collection Time: 05/25/16  3:58 PM  Result Value Ref Range Status   Specimen Description BLOOD BLOOD RIGHT FOREARM  Final   Special Requests BOTTLES DRAWN AEROBIC AND ANAEROBIC 5CC  Final   Culture NO GROWTH 2 DAYS  Final   Report Status PENDING  Incomplete   Time coordinating discharge:  32 minutes  SIGNED:  Irwin Brakeman, MD  [facility] 05/28/2016, 11:38 AM Pager   If 7PM-7AM, please contact night-coverage [email or web address] Password [email or web address]

## 2016-05-28 NOTE — Progress Notes (Signed)
Physical Therapy Treatment Patient Details Name: Destiny Shelton MRN: [id number] DOB: 1945/12/07 Today's Date: 05/28/2016    History of Present Illness Pt. with PMH of COPD, type II DM, HTN, PAF, chronic respiratory failure on 5 L of oxygen; admitted on 05/25/2016, with complaint of shortness of breath, was found to have COPD exacerbation    PT Comments    Patient seen for mobility progression. Tolerated some in room ambulation on 5 liters [place], patient with desaturation to 77% with HR 122. Improved to 90% and HR of 113 after 2 minutes of seated rest break with pursed lip breathing. Nsg aware. Improved stability with ambulation but continues to desaturate at this time. Will continue to see and progress as tolerated.   Follow Up Recommendations  Home health PT;Supervision/Assistance - 24 hour     Equipment Recommendations  None recommended by PT    Recommendations for Other Services       Precautions / Restrictions Precautions Precautions: Fall Restrictions Weight Bearing Restrictions: No    Mobility  Bed Mobility Overal bed mobility: Needs Assistance Bed Mobility: Supine to Sit     Supine to sit: Modified independent (Device/Increase time)     General bed mobility comments: use of bed rail, no physical assist required  Transfers Overall transfer level: Needs assistance Equipment used: Rolling walker (2 wheeled) Transfers: Sit to/from Stand Sit to Stand: Supervision         General transfer comment: supervision for safety, no physical assist required  Ambulation/Gait Ambulation/Gait assistance: Supervision Ambulation Distance (Feet): 40 Feet Assistive device: Rolling walker (2 wheeled) Gait Pattern/deviations: Step-through pattern;Decreased stride length;Trunk flexed Gait velocity: decreased   General Gait Details: in room ambulation on 5 liters [place], patient with desaturation to 77% with HR 122. Improved to 90% and HR of 113 after 2 minutes of seated rest break  with pursed lip breathing.   Stairs            Wheelchair Mobility    Modified Rankin (Stroke Patients Only)       Balance Overall balance assessment: History of Falls                                  Cognition Arousal/Alertness: Awake/alert Behavior During Therapy: WFL for tasks assessed/performed Overall Cognitive Status: Within Functional Limits for tasks assessed                      Exercises      General Comments        Pertinent Vitals/Pain Pain Assessment: 0-10 Pain Score: 4  Pain Location: back and left side  Pain Descriptors / Indicators: Sore    Home Living                      Prior Function            PT Goals (current goals can now be found in the care plan section) Acute Rehab PT Goals Patient Stated Goal: to walk PT Goal Formulation: With patient Time For Goal Achievement: 06/10/16 Potential to Achieve Goals: Fair Progress towards PT goals: Progressing toward goals    Frequency    Min 3X/week      PT Plan Current plan remains appropriate    Co-evaluation             End of Session Equipment Utilized During Treatment: Gait belt;Oxygen Activity Tolerance: Patient limited by fatigue (  desaturations to 76% with extremely limited activity) Patient left: in chair;with call bell/phone within reach;with family/visitor present     Time: [id number] PT Time Calculation (min) (ACUTE ONLY): 18 min  Charges:  $Gait Training: 8-22 mins                    G Codes:      Duncan Dull 06/03/2016, 9:10 AM Alben Deeds, PT DPT  [phone]

## 2016-05-28 NOTE — Discharge Instructions (Signed)
Shortness of Breath  Shortness of breath means you have trouble breathing. Shortness of breath needs medical care right away.  HOME CARE   ? Do not smoke.  ? Avoid being around chemicals or things (paint fumes, dust) that may bother your breathing.  ? Rest as needed. Slowly begin your normal activities.  ? Only take medicines as told by your doctor.  ? Keep all doctor visits as told.  GET HELP RIGHT AWAY IF:   ? Your shortness of breath gets worse.  ? You feel lightheaded, pass out (faint), or have a cough that is not helped by medicine.  ? You cough up blood.  ? You have pain with breathing.  ? You have pain in your chest, arms, shoulders, or belly (abdomen).  ? You have a fever.  ? You cannot walk up stairs or exercise the way you normally do.  ? You do not get better in the time expected.  ? You have a hard time doing normal activities even with rest.  ? You have problems with your medicines.  ? You have any new symptoms.  MAKE SURE YOU:  ? Understand these instructions.  ? Will watch your condition.  ? Will get help right away if you are not doing well or get worse.  This information is not intended to replace advice given to you by your health care provider. Make sure you discuss any questions you have with your health care provider.  Document Released: 11/05/2007 Document Revised: 05/24/2013 Document Reviewed: 08/04/2011  Elsevier Interactive Patient Education ? 2017 [employer]

## 2016-05-28 NOTE — Progress Notes (Signed)
[facility] Progress Note  Patient: Destiny Shelton [id number]   PCP: Marton Redwood, MD DOB: 06/27/1945   DOA: 05/25/2016   DOS: 05/28/2016   Date of Service: the patient was seen and examined on 05/28/2016  Brief hospital course: Pt. with PMH of COPD, type II DM, HTN, PAF, chronic respiratory failure on 5 L of oxygen; admitted on 05/25/2016, with complaint of shortness of breath, was found to have COPD exacerbation.Patient remained significantly hypoxic with saturation dropping to 76% on exertion despite on 5 L of oxygen.    Assessment and Plan: 1. COPD exacerbation likely viral bronchitis Continue IV Levaquin, continue duo nebs, continue steroids. Influenza PCR negative Negative blood cultures so far. GOLD COPD Home Management Program in Place   2. Acute kidney injury. Baseline less than 1, on presentation to 0.07. Improved.    3. Hypokalemia  - Serum potassium is 3.0 on admission with normal mag level, repleted.   4. Chronic diastolic CHF  - Pt appears dry on admission and was given 500 cc NS in ED  - TTE (07/17/15) with EF 55-60%, mild LVH, grade 1 diastolic CHF, mild MR, and mild LAE  She was given gentle IV fluids, significant improvement in renal function after stopping ARB as well as Lasix.  5. Paroxysmal atrial fibrillation  - Pt is in sinus rhythm on admission  - CHAD-VASc is 74 (age, gender, CHF, HTN)  - Continue anticoagulation with Xarelto,  - Continue verapamil for rate-control    6. Insulin-dependent DM  - A1c was 6.4% in 2013  - She is managed with Lantus 66 units qD and Humalog 8 units BID at home - Check CBG with meals and qHS  - Continue Lantus at 60 units qD with Novololg 3 units with meals and per moderate-intensity sliding-scale  CBG (last 3)   Recent Labs  05/27/16 1632 05/27/16 2101 05/28/16 0545  GLUCAP 156* 172* 160*    7. Fall with left hip pain   - Pt suffered a mechanical fall at home just PTA and struck left hip on bathtub; no  head strike or LOC  - Radiographs negative for acute fracture  - CT obtained given persistent pain and difficulty ambulating; this is notable for possible low-grade IT band injury, but no fracture or dislocation  - Pain-control prn; PT eval recommending HHPT   8. Hyponatremia  Resolved with fluids  9. Hypertension - BP has normal - Continue verapamil for rate-control   Pain management: PRN tylenol Activity: Home health per physical therapy Diet: cardiac diet DVT Prophylaxis: xarelto  Advance goals of care discussion: full code  Family Communication: family was present at bedside, at the time of interview. The pt provided permission to discuss medical plan with the family. Opportunity was given to ask question and all questions were answered satisfactorily.   Disposition:  Discharge to home. Expected discharge date: 05/29/2016   Consultants: none Procedures: none  Antibiotics: Anti-infectives    Start     Dose/Rate Route Frequency Ordered Stop   05/27/16 1200  levofloxacin (LEVAQUIN) IVPB 500 mg     500 mg 100 mL/hr over 60 Minutes Intravenous Every 24 hours 05/27/16 1105 06/02/16 2359   05/25/16 2115  levofloxacin (LEVAQUIN) IVPB 500 mg  Status:  Discontinued     500 mg 100 mL/hr over 60 Minutes Intravenous Every 48 hours 05/25/16 1915 05/27/16 1105   05/25/16 1800  cefTRIAXone (ROCEPHIN) 1 g in dextrose 5 % 50 mL IVPB     1 g 100  mL/hr over 30 Minutes Intravenous  Once 05/25/16 1752 05/25/16 1917      Subjective:  Still SOB with exertion  Objective: Physical Exam: Vitals:   05/27/16 2027 05/27/16 2100 05/28/16 0429 05/28/16 1019  BP: (!) 106/56  (!) 112/58   Pulse: 87  94   Resp: 18  18   Temp: 98.4 F (36.9 C)  98.8 F (37.1 C)   TempSrc: Oral  Oral   SpO2: 97% 95% 95% 91%  Weight:   98.7 kg (217 lb 11.2 oz)   Height:        Intake/Output Summary (Last 24 hours) at 05/28/16 1054 Last data filed at 05/28/16 1002  Gross per 24 hour  Intake               660 ml  Output             1510 ml  Net             -850 ml   Filed Weights   05/26/16 0434 05/27/16 0505 05/28/16 0429  Weight: 98.1 kg (216 lb 4.8 oz) 98.3 kg (216 lb 11.2 oz) 98.7 kg (217 lb 11.2 oz)    General: Alert, Awake and Oriented to Time, Place and Person. Appear in mild distress, affect appropriate Eyes: PERRL, Conjunctiva normal ENT: Oral Mucosa clear moisty. Neck: no JVD, no Abnormal Mass Or lumps Cardiovascular: S1 and S2 Present, no Murmur, Respiratory: Bilateral Air entry equal and Decreased, no use of accessory muscle, no Crackles, bilateral wheezes Abdomen: Bowel Sound present, Soft and no tenderness Skin: no redness, no Rash, no induration Extremities: no Pedal edema, no calf tenderness Neurologic: Grossly no focal neuro deficit. Bilaterally Equal motor strength  Data Reviewed: CBC:  Recent Labs Lab 05/25/16 1558 05/27/16 0347 05/28/16 0545  WBC 16.7* 14.5* 11.4*  NEUTROABS 13.0* 12.7* 9.9*  HGB 13.3 13.0 13.0  HCT 40.9 41.4 40.8  MCV 91.3 93.0 91.9  PLT 195 174 0000000   Basic Metabolic Panel:  Recent Labs Lab 05/25/16 1558 05/25/16 1744 05/26/16 0308 05/27/16 0347 05/28/16 0545  NA 132*  --  136 139 140  K 3.0*  --  3.0* 4.4 3.4*  CL 81*  --  85* 91* 93*  CO2 35*  --  40* 39* 40*  GLUCOSE 194*  --  228* 174* 170*  BUN 63*  --  44* 29* 27*  CREATININE 2.07*  --  1.32* 1.03* 0.84  CALCIUM 10.2  --  9.6 9.8 9.0  MG  --  2.3 2.3 2.5* 2.5*    Liver Function Tests:  Recent Labs Lab 05/27/16 0347 05/28/16 0545  AST 27 21  ALT 22 21  ALKPHOS 57 52  BILITOT 0.6 0.4  PROT 6.5 5.9*  ALBUMIN 3.3* 2.9*   No results for input(s): LIPASE, AMYLASE in the last 168 hours. No results for input(s): AMMONIA in the last 168 hours. Coagulation Profile:  Recent Labs Lab 05/25/16 1558  INR 1.05   Cardiac Enzymes:  Recent Labs Lab 05/25/16 1558  TROPONINI 0.03*   BNP (last 3 results)  Recent Labs  05/19/16 1059  PROBNP 47     CBG:  Recent Labs Lab 05/27/16 0609 05/27/16 1129 05/27/16 1632 05/27/16 2101 05/28/16 0545  GLUCAP 191* 285* 156* 172* 160*    Studies: No results found.   Scheduled Meds: . atorvastatin  40 mg Oral QHS  . buPROPion  150 mg Oral BH-q7a  . darifenacin  7.5 mg Oral Daily  . DULoxetine  60 mg Oral q morning - 10a  . gabapentin  600 mg Oral TID  . insulin aspart  0-15 Units Subcutaneous TID WC  . insulin aspart  0-5 Units Subcutaneous QHS  . insulin aspart  3 Units Subcutaneous TID WC  . insulin glargine  60 Units Subcutaneous Daily  . levofloxacin (LEVAQUIN) IV  500 mg Intravenous Q24H  . methylPREDNISolone (SOLU-MEDROL) injection  60 mg Intravenous Q6H  . mometasone-formoterol  2 puff Inhalation BID  . multivitamin with minerals  1 tablet Oral q morning - 10a  . pantoprazole  40 mg Oral Daily  . polyethylene glycol  17 g Oral Daily  . potassium chloride  40 mEq Oral Once  . rivaroxaban  15 mg Oral Q supper  . roflumilast  500 mcg Oral Daily  . sodium chloride flush  3 mL Intravenous Q12H  . verapamil  360 mg Oral Daily   Continuous Infusions: PRN Meds: acetaminophen, ALPRAZolam, benzonatate, HYDROcodone-acetaminophen, ipratropium-albuterol, morphine injection, ondansetron **OR** ondansetron (ZOFRAN) IV  Time spent: 25 minutes  AuthorMurvin Natal, MD [facility] Pager: [phone] 05/28/2016 10:54 AM  If 7PM-7AM, please contact night-coverage at [email or web address], password [place]

## 2016-05-28 NOTE — Evaluation (Signed)
Occupational Therapy Evaluation and Discharge Patient Details Name: Destiny Shelton MRN: [id number] DOB: 1945-12-29 Today's Date: 05/28/2016    History of Present Illness Pt. with PMH of COPD, type II DM, HTN, PAF, chronic respiratory failure on 5 L of oxygen; admitted on 05/25/2016, with complaint of shortness of breath, was found to have COPD exacerbation   Clinical Impression   Pt at baseline with no shortness of breath this session. See performance level below. Pt and husband educated in energy conservation techniques for home and handout provided, as well as safety for tub transfers. Pt and husband with good understanding and safety routine after history of falls. Pt expected to dc today, and OT to sign off. Thank you for this referral.    Follow Up Recommendations  No OT follow up;Supervision - Intermittent    Equipment Recommendations  None recommended by OT    Recommendations for Other Services       Precautions / Restrictions Precautions Precautions: Fall Restrictions Weight Bearing Restrictions: No      Mobility Bed Mobility               General bed mobility comments: Pt sitting in recliner OOB when OT entered  Transfers Overall transfer level: Needs assistance Equipment used: Rolling walker (2 wheeled) Transfers: Sit to/from Stand Sit to Stand: Supervision         General transfer comment: supervision for safety, no physical assist required    Balance Overall balance assessment: History of Falls                                          ADL Overall ADL's : At baseline                                       General ADL Comments: Pt sitting down for seated grooming at sink. She reports this is baseline. 02 saturation dropped from ~90 to 77 during ambulation to sink with RW while on 5L 02. 02 returned to ~90 within 2 min. Pt and husband educated in [place] for LB dressing and bathing as Pt's husband has been providing  min assist, and also energy conservation.     Vision Vision Assessment?: No apparent visual deficits   Perception     Praxis      Pertinent Vitals/Pain Pain Assessment: 0-10 Pain Score: 10-Worst pain ever Pain Location: back and left side  Pain Descriptors / Indicators: Sore;Throbbing (hurts most when taking in a deep breath)     Hand Dominance Right   Extremity/Trunk Assessment Upper Extremity Assessment Upper Extremity Assessment: Generalized weakness   Lower Extremity Assessment Lower Extremity Assessment: Generalized weakness       Communication Communication Communication: No difficulties   Cognition Arousal/Alertness: Awake/alert Behavior During Therapy: WFL for tasks assessed/performed Overall Cognitive Status: Within Functional Limits for tasks assessed                     General Comments       Exercises       Shoulder Instructions      Home Living Family/patient expects to be discharged to:: Private residence Living Arrangements: Spouse/significant other Available Help at Discharge: Family;Available 24 hours/day Type of Home: House Home Access: Level entry     Home Layout: One level  Bathroom Shower/Tub: Risk analyst characteristics: Architectural technologist: Standard Bathroom Accessibility: No (only )   Home Equipment: Environmental consultant - 4 wheels;Cane - quad;Bedside commode;Shower seat;Hand held shower head;Wheelchair - manual   Additional Comments: home 02      Prior Functioning/Environment Level of Independence: Needs assistance  Gait / Transfers Assistance Needed: used cane for ambulation ADL's / Homemaking Assistance Needed: dependent in IADL, min A for ADL   Comments: uses 4 wheeled walked at home        OT Problem List: Decreased activity tolerance;Cardiopulmonary status limiting activity   OT Treatment/Interventions:      OT Goals(Current goals can be found in the care plan section) Acute Rehab OT  Goals Patient Stated Goal: to walk OT Goal Formulation: With patient Time For Goal Achievement: 06/04/16 Potential to Achieve Goals: Good  OT Frequency:     Barriers to D/C:            Co-evaluation              End of Session Equipment Utilized During Treatment: Rolling walker;Gait belt Nurse Communication: Mobility status;Other (comment) (Pt requests fluids (ice water))  Activity Tolerance: Patient tolerated treatment well Patient left: in chair;with call bell/phone within reach   Time: [id number] OT Time Calculation (min): 39 min Charges:  OT General Charges $OT Visit: 1 Procedure OT Evaluation $OT Eval Moderate Complexity: 1 Procedure OT Treatments $Self Care/Home Management : 23-37 mins G-Codes:    Destiny Shelton 06-01-2016, 3:01 PM

## 2016-05-29 ENCOUNTER — Ambulatory Visit (HOSPITAL_COMMUNITY)
Admission: RE | Admit: 2016-05-29 | Discharge: 2016-05-29 | Disposition: A | Discharge status: Home / Self Care | Payer: Medicare Other | Source: Ambulatory Visit | Attending: Cardiology | Admitting: Cardiology

## 2016-05-29 ENCOUNTER — Encounter (HOSPITAL_COMMUNITY): Payer: Self-pay

## 2016-05-29 VITALS — BP 106/58 | HR 91 | Wt 222.2 lb

## 2016-05-29 DIAGNOSIS — I272 Pulmonary hypertension, unspecified: Secondary | ICD-10-CM | POA: Diagnosis not present

## 2016-05-29 DIAGNOSIS — Z6836 Body mass index (BMI) 36.0-36.9, adult: Secondary | ICD-10-CM | POA: Diagnosis not present

## 2016-05-29 DIAGNOSIS — Z7901 Long term (current) use of anticoagulants: Secondary | ICD-10-CM | POA: Insufficient documentation

## 2016-05-29 DIAGNOSIS — Z79899 Other long term (current) drug therapy: Secondary | ICD-10-CM | POA: Insufficient documentation

## 2016-05-29 DIAGNOSIS — J449 Chronic obstructive pulmonary disease, unspecified: Secondary | ICD-10-CM | POA: Insufficient documentation

## 2016-05-29 DIAGNOSIS — Z794 Long term (current) use of insulin: Secondary | ICD-10-CM | POA: Insufficient documentation

## 2016-05-29 DIAGNOSIS — I5032 Chronic diastolic (congestive) heart failure: Secondary | ICD-10-CM | POA: Diagnosis not present

## 2016-05-29 DIAGNOSIS — Z87891 Personal history of nicotine dependence: Secondary | ICD-10-CM | POA: Insufficient documentation

## 2016-05-29 DIAGNOSIS — R000 Tachycardia, unspecified: Secondary | ICD-10-CM | POA: Diagnosis not present

## 2016-05-29 DIAGNOSIS — I480 Paroxysmal atrial fibrillation: Secondary | ICD-10-CM

## 2016-05-29 DIAGNOSIS — E119 Type 2 diabetes mellitus without complications: Secondary | ICD-10-CM | POA: Insufficient documentation

## 2016-05-29 DIAGNOSIS — G4733 Obstructive sleep apnea (adult) (pediatric): Secondary | ICD-10-CM | POA: Insufficient documentation

## 2016-05-29 DIAGNOSIS — Z9981 Dependence on supplemental oxygen: Secondary | ICD-10-CM | POA: Insufficient documentation

## 2016-05-29 DIAGNOSIS — I110 Hypertensive heart disease with heart failure: Secondary | ICD-10-CM | POA: Insufficient documentation

## 2016-05-29 DIAGNOSIS — E669 Obesity, unspecified: Secondary | ICD-10-CM | POA: Insufficient documentation

## 2016-05-29 MED ORDER — FUROSEMIDE 40 MG PO TABS: ORAL_TABLET | ORAL | 3 refills | Status: DC

## 2016-05-29 NOTE — Progress Notes (Signed)
Patient ID: Destiny Shelton, female   DOB: [birth year], 70 y.o.   MRN: [id number]  PCP: Dr. Brigitte Pulse HF Cardiology: Dr. Aundra Dubin  70 y.o. with history of paroxysmal atrial fibrillation, chronic diastolic CHF with restrictive hemodynamics on 2013 RHC, COPD on home oxygen, inappropriate sinus tachycardia, and concern for intracardiac shunting presents for CHF clinic evaluation.  She has been followed for several years by both cardiology and pulmonology.  She is a prior smoker and has been diagnosed with COPD.  Interestingly, her PFTs in 10/16 showed improvement, suggesting mild obstructive airways disease.  However, she still requires home oxygen.  She had transcranial dopplers in 2013 suggesting a medium-sized right to left shunt.  This led to an extensive workup.  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  Cardiac MRI was unremarkable, no evidence for shunt lesion.  RHC/LHC showed no coronary disease but it did show restrictive hemodynamics and volume overload.  Qp/Qs from this study was 1.27/1, suggesting a relatively small left to right shunt.  A definite shunt lesion was never identified.    Echo in 2/17 showed normal LV size and systolic function and normal RV.  I also did a RHC in 2/17: on higher dose diuretics, she had mild pulmonary hypertension and normal PCWP.  There was no evidence for a shunt lesion with Qp/Qs 0.97.   She was admitted in 9/17 with acute exacerbation of COPD.  She was treated with steroids and nebs.  She was admitted in 12/17 with suspected COPD exacerbation, treated with steroids and levofloxacin.  Lasix was decreased to 80 mg once daily.  She was just discharged yesterday.   Breathing back to baseline s/p recent admission for COPD exacerbation.  No dyspnea walking around her house.  Walked in with walker without problems.  Occasional cough.  No chest pain.  Chronic orthopnea, has slept in a recliner since the 1990s.  No BRBPR or melena.    Labs (9/16): digoxin  0.6, K 4.3, creatinine 0.85 Labs (1/17): K 3.9, creatinine 0.96, hgb 8.9 Labs (3/17): K 4.2, creatinine 0.96 Labs (6/17): K 4.2, creatinine 1.03 Labs (8/17): K 4.1, creatinine 0.81, HCT 34.3 Labs (9/17): K 3.7, creatinine 0.91, hgb 12.1 Labs (12/17): K 3.4, creatinine 0.84, hgb 13  PMH: 1. Atrial fibrillation: Paroxysmal.  2. HTN 3. COPD: Home oxygen.  PFTs (10/16) with FVC 77%, FEV1 68%, TLC 89% => mild obstruction (improved from the past).   4. Type II diabetes 5. Obesity 6. Inappropriate sinus tachycardia 7. OSA: Does not use CPAP, uses oxygen at night.  8. Chronic diastolic CHF: RHC/LHC in 0000000 with no significant CAD, mean RA 19, RV 46/18, PA 45/29 mean 37, mean PCWP 22, CI 4.1, PVR 1.4 WU => restrictive hemodynamics with no evidence for constriction;  Qp/Qs 1.27/1; saturation run difficult to interpret.  TEE (10/13): EF 55-60%, LVH, mild MR, bubble study showed no evidence for immediate R=>L bubble crossing, small amount of bubbles ended up in left heart > 5 seconds after injection suggesting possible intrapulmonary shunting; no shunt by color doppler.  Cardiac MRI (8/13) with EF 59%, no LGE, normal RV size/systolic function => interpreted as normal.  RHC 07/04/2015: RA mean 9, RV 45/10, PA 41/20 mean 30, PCWP mean 13, no step up on shunt run,cardiac output (Fick) 6.7 cardiac index (Fick) 3.3, PVR 2.5 WU, Qp/Qs = 0.97. Echo (2/17) with EF 55-60%, mild LVH, mild MR, normal RV size and systolic function.  9. ?Intracardiac shunt: Transcranial dopplers in 10/13 were suggestive  of medium-sized right to left shunt.  Bubble study on TEE in 10/13 showed no evidence for immediate R=>L bubble crossing, small amount of bubbles ended up in left heart > 5 seconds after injection suggesting possible intrapulmonary shunting; no shunt by color doppler. Cardiac MRI showed no evidence for shunting.  Cardiac cath in 2013 actually was suggestive of a small left to right shunt.  Cardiac cath in 2/17 NOT suggestive  of significant shunt, Qp/Qs 0.97.  10. ?Interstitial lung disease.   SH: Married, prior heavy smoker (quit 1996).  No ETOH.   Family History  Problem Relation Age of Onset  . Heart disease Mother   . Stroke Mother   . Heart disease Father   . Heart disease Brother   . Stroke Brother   . Skin cancer Brother   . Colon polyps Sister   . Diabetes Sister   . Diabetes Brother   . Irritable bowel syndrome Daughter   . Colon cancer Neg Hx    ROS: All systems reviewed and negative except as per HPI.   Current Outpatient Prescriptions  Medication Sig Dispense Refill  . acetaminophen (TYLENOL) 500 MG tablet Take 1,000 mg by mouth every 6 (six) hours as needed for mild pain or headache.     . albuterol (PROVENTIL) (2.5 MG/3ML) 0.083% nebulizer solution Take 3 mLs (2.5 mg total) by nebulization every 6 (six) hours as needed for wheezing or shortness of breath. 75 mL 3  . ALPRAZolam (XANAX) 0.5 MG tablet Take 0.25 mg by mouth at bedtime as needed for sleep.     Marland Kitchen atorvastatin (LIPITOR) 40 MG tablet Take 40 mg by mouth at bedtime.     . benzonatate (TESSALON) 200 MG capsule Take 200 mg by mouth at bedtime.     Marland Kitchen buPROPion (WELLBUTRIN XL) 300 MG 24 hr tablet Take 300 mg by mouth daily.    Marland Kitchen docusate sodium (COLACE) 100 MG capsule Take 100 mg by mouth 2 (two) times daily as needed for mild constipation.    . DULoxetine (CYMBALTA) 60 MG capsule Take 60 mg by mouth daily.     . furosemide (LASIX) 40 MG tablet Take 80 mg (2 Tabs) in the AM, take 40 mg (1 Tab) in the PM. 90 tablet 3  . gabapentin (NEURONTIN) 600 MG tablet Take 600 mg by mouth 3 (three) times daily.     . Guaifenesin (MUCINEX MAXIMUM STRENGTH) 1200 MG TB12 Take 1,200 mg by mouth every 12 (twelve) hours.    Marland Kitchen HYDROcodone-acetaminophen (NORCO/VICODIN) 5-325 MG per tablet Take 1 tablet by mouth every 4 (four) hours as needed for moderate pain. Reported on 12/12/2015    . insulin glargine (LANTUS) 100 UNIT/ML injection Inject 0.66 mLs (66  Units total) into the skin daily before breakfast. (Patient taking differently: Inject 64 Units into the skin daily before breakfast. )    . insulin lispro (HUMALOG) 100 UNIT/ML injection Inject 6 Units into the skin See admin instructions. Inject 6 units subcutaneously twice daily (with breakfast and supper) if CBG >150    . irbesartan (AVAPRO) 150 MG tablet Take 1 tablet (150 mg total) by mouth daily. PLEASE CONTACT OFFICE FOR ADDITIONAL REFILLS 15 tablet 0  . iron polysaccharides (NIFEREX) 150 MG capsule Take 1 capsule (150 mg total) by mouth 2 (two) times daily. 60 capsule 0  . levalbuterol (XOPENEX HFA) 45 MCG/ACT inhaler Inhale 1-2 puffs into the lungs every 4 (four) hours as needed for wheezing. 1 Inhaler 12  . levofloxacin (LEVAQUIN)  500 MG tablet Take 1 tablet (500 mg total) by mouth daily. 5 tablet 0  . Multiple Vitamin (MULTIVITAMIN WITH MINERALS) TABS tablet Take 1 tablet by mouth daily.    Marland Kitchen omeprazole (PRILOSEC) 20 MG capsule Take 20 mg by mouth daily after supper.     . ONE TOUCH ULTRA TEST test strip 1 each by Other route 3 (three) times daily.   4  . OXYGEN Inhale 5 L into the lungs continuous.     . polyethylene glycol (MIRALAX / GLYCOLAX) packet Take 17 g by mouth daily. Mix in 8 oz liquid and drink    . polyvinyl alcohol (ARTIFICIAL TEARS) 1.4 % ophthalmic solution Place 1 drop into both eyes daily.    . potassium chloride (K-DUR,KLOR-CON) 10 MEQ tablet Take 3 tablets (30 mEq total) by mouth 2 (two) times daily. 180 tablet 3  . predniSONE (DELTASONE) 20 MG tablet Take 3 tabs daily with breakfast for 5 days, then 2 tabs daily with breakfast for 5 days, then 1 tab daily for 5 days, then stop 30 tablet 0  . rivaroxaban (XARELTO) 20 MG TABS tablet Take 1 tablet (20 mg total) by mouth daily with supper. Restart on Wednesday May 11 , 2016 30 tablet   . roflumilast (DALIRESP) 500 MCG TABS tablet Take 500 mcg by mouth daily.    . solifenacin (VESICARE) 5 MG tablet Take 5 mg by mouth  daily.    . SYMBICORT 160-4.5 MCG/ACT inhaler INHALE 2 PUFFS BY MOUTH TWICE DAILY 10.2 g 5  . tiotropium (SPIRIVA) 18 MCG inhalation capsule Place 1 capsule (18 mcg total) into inhaler and inhale every morning. (Patient taking differently: Place 18 mcg into inhaler and inhale daily. ) 30 capsule 11  . verapamil (VERELAN PM) 360 MG 24 hr capsule Take 1 capsule (360 mg total) by mouth daily. KEEP OV. 90 capsule 0   No current facility-administered medications for this encounter.    BP (!) 106/58   Pulse 91   Wt 222 lb 4 oz (100.8 kg)   SpO2 95% Comment: on 5L of O2  BMI 36.98 kg/m  General: NAD, obese, wearing oxygen.  Neck: JVP 7-8 cm, no thyromegaly or thyroid nodule.  Lungs: Distant breath sounds bilaterally.   CV: Nondisplaced PMI.  Heart mildly tachy, regular S1/S2, no S3/S4, 2/6 HSM LLSB.  Trace ankle edema bilaterally.  No carotid bruit.  Normal pedal pulses.  Abdomen: Soft, nontender, no hepatosplenomegaly, no distention.  Skin: Intact without lesions or rashes.  Neurologic: Alert and oriented x 3.  Psych: Normal affect. Extremities: No clubbing or cyanosis.  HEENT: Normal.   Assessment/Plan: 1. COPD: On home oxygen, 5L at rest and 6L with exertion.  PFTs in 10/16 actually showed only mild obstruction (improved).  Breathing has improved in the past with increased diuresis, suggesting that CHF plays a significant role in her symptoms in addition to COPD.  She was just discharged yesterday from an admission for COPD exacerbation.  Of note, last pulmonary note raises concern for interstitial lung disease.  She has a high resolution CT chest scheduled.  2. Atrial fibrillation: Paroxysmal, CHADSVASC = 4.  She remains in NSR today and has not felt atrial fibrillation recently.   - She will continue Xarelto.  - She can continue verapamil.     3. Chronic diastolic CHF: NYHA class III symptoms, likely due to combination of CHF and COPD.  Volume looks ok today.   - Will increase Lasix back  up to 80 qam/40  qpm (was cut back to 80 daily in hospital).  Will follow clinically, but suspect she may need to go back up to 80 mg bid eventually.  BMET 1 week.  4. ?Shunt lesion: She had transcranial dopplers in 2013 suggesting a medium-sized right to left shunt.  This led to an extensive workup.  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  Cardiac MRI was unremarkable, no evidence for shunt lesion.  RHC/LHC in 2013 showed no coronary disease but it did show restrictive hemodynamics and volume overload.  Qp/Qs from this study was 1.27/1, suggesting a relatively small left to right shunt.  A definite shunt lesion was never identified.  Based on prior workup, it seems most likely that the right to left shunting by transcranial dopplers and TEE bubble study was due to pulmonary AVMs.  RHC repeated in 2/17 did not show a significant shunt lesion, with Qp/Qs 0.97.  5. Inappropriate sinus tachycardia: HR lower than usual in 90s today.  Had discussed using Corlanor in the past but interacts with verapamil.  6. Pulmonary hypertension: Mild pulmonary hypertension on RHC with PVR only 2.5 WU.  Probably due to COPD and elevated left atrial pressure.  No specific treatment other than diuresis.   Followup in 1 month.   Loralie Champagne 05/29/2016

## 2016-05-29 NOTE — Patient Instructions (Signed)
Increase Furosamide to 80 mg (2 Tabs) in the AM, 40 mg (1 Tab) in the PM  Lab work in 1 week  Follow up in 1 month

## 2016-05-30 LAB — CULTURE, BLOOD (ROUTINE X 2)
CULTURE: NO GROWTH
CULTURE: NO GROWTH

## 2016-06-05 ENCOUNTER — Ambulatory Visit (HOSPITAL_COMMUNITY)
Admission: RE | Admit: 2016-06-05 | Discharge: 2016-06-05 | Disposition: A | Discharge status: Home / Self Care | Payer: Medicare Other | Source: Ambulatory Visit | Attending: Internal Medicine | Admitting: Internal Medicine

## 2016-06-05 DIAGNOSIS — I5032 Chronic diastolic (congestive) heart failure: Secondary | ICD-10-CM | POA: Insufficient documentation

## 2016-06-05 LAB — BASIC METABOLIC PANEL
Anion gap: 9 (ref 5–15)
BUN: 18 mg/dL (ref 6–20)
CHLORIDE: 99 mmol/L — AB (ref 101–111)
CO2: 28 mmol/L (ref 22–32)
Calcium: 9 mg/dL (ref 8.9–10.3)
Creatinine, Ser: 1.27 mg/dL — ABNORMAL HIGH (ref 0.44–1.00)
GFR calc Af Amer: 48 mL/min — ABNORMAL LOW (ref >60)
GFR, EST NON AFRICAN AMERICAN: 41 mL/min — AB (ref >60)
Glucose, Bld: 248 mg/dL — ABNORMAL HIGH (ref 65–99)
POTASSIUM: 4.2 mmol/L (ref 3.5–5.1)
Sodium: 136 mmol/L (ref 135–145)

## 2016-06-09 ENCOUNTER — Encounter: Payer: Self-pay | Admitting: Acute Care

## 2016-06-09 ENCOUNTER — Ambulatory Visit (INDEPENDENT_AMBULATORY_CARE_PROVIDER_SITE_OTHER): Payer: Medicare Other | Admitting: Acute Care

## 2016-06-09 DIAGNOSIS — J9621 Acute and chronic respiratory failure with hypoxia: Secondary | ICD-10-CM | POA: Diagnosis not present

## 2016-06-09 DIAGNOSIS — J441 Chronic obstructive pulmonary disease with (acute) exacerbation: Secondary | ICD-10-CM | POA: Diagnosis not present

## 2016-06-09 MED ORDER — DOXYCYCLINE HYCLATE 100 MG PO TABS: 100.0000 mg | ORAL_TABLET | Freq: Two times a day (BID) | ORAL | 0 refills | Status: DC

## 2016-06-09 MED ORDER — ALBUTEROL SULFATE (2.5 MG/3ML) 0.083% IN NEBU: 2.5000 mg | INHALATION_SOLUTION | Freq: Four times a day (QID) | RESPIRATORY_TRACT | 3 refills | Status: DC | PRN

## 2016-06-09 NOTE — Progress Notes (Signed)
History of Present Illness Destiny Shelton is a 71 y.o. female former smoker ( 50 pack year smoking history/ Quit 1990's) with pulmonary hypertension, A-Fib, and COPD. She is a former patient of Dr. Gwenette Greet, currently followed by Dr. Lake Bells.  Synopsys: Former patient of Dr. Gwenette Greet with pulmonary hypertension, Afib, and COPD.  She smoked 2 packs per day for 25 years and quit in the 1990's.  She has been on oxygen since 2012.  As of 2017 She has been using 5L O2 at rest and 6 L with exertion.   She does not have a history of PE that she is aware of.   pfts 2011:  FEV1 1.34 (55%), ratio 68, ++airtrapping on lung volumes, nl TLC, DLCO 40% pred. PFT's  03/23/2015  FEV1 1.61 (68 % ) ratio 69  p no % improvement from saba on   Symb/spiriva  Echo 2012: nl LV and EF, impaired relaxation, nothing to suggest pulm htn. Myoview 2012: no ischemia LHC 12/2011: non-obstructive cad RHC 12/2011:  PA 45/29 with mean 21mm, PCWP 22, CO Fick 10.25, PVR 1.17 wood units, slight step up in saturation from                         RV to PA [place] 12/2011:  Equal 4 chamber pressures, but no square root sign.   Echo [id number]:  Nl LV, diastolic dysfxn, normal RV, estimated RVSP 46.  TEE 03/2012:  A few late bubbles seen felt c/w small IP shunting.  CT chest 12/2011:  No PE, very mild mosaic perfusion abnl (prob secondary to her known airflow obstruction).  Cardiac MRI 2013:  No infiltrative process.  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  07/04/2015 RHC: RA mean 9 RV 45/10 PA 41/20, mean 30 PCWP mean 13  Oxygen saturations: SVC 68% RA 65% RV 61% PA 69% LA 98% Peripheral sat 93%  Cardiac Output (Fick) 6.7  Cardiac Index (Fick) 3.3 PVR 2.5 WU Qp/Qs = 0.97     06/09/2016 Hospital Follow Up: Pt. Presents for Hospital Follow Up. She was admitted to the hospital for a fall, Acute COPD exacerbation 2/2 viral bronchitis from 05/25/2016-05/28/2016. She had been seen by Dr. Lake Bells 05/19/2016, and  treated at that time as an outpatient with a prednisone taper and doxycycline. Dr. Lake Bells did warn her at that time that if she did not improve, she would need hospitalization. She was treated as an inpatient post admission with IV Levaquin, IV steroids, and scheduled Bronchodilators. BNP on admission was 15.5 pg/mL She completed the Levaquin as instructed, and she is still tapering off the prednisone.She is compliant with her Symbicort , Daliresp and Spiriva.She states she is using her nebulizer treatments only when she needs them, which she states last time was about 6 months ago. We had a discussion about using her nebulizer treatments for bad days, which she does not seem to be doing. She is wearing her oxygen at 5 L [place]. She states she does have oxygen desaturations. She does have a HRCT scheduled for 06/17/2016 per Dr. Lake Bells.This is to evaluate for ILD as she seems to have oxygen desaturations that are disproportional to her degree of COPD.She denies fever, chest pain, orthopnea or hemoptysis. She does state she is coughing up thick green secretions.She did drop her saturations today with ambulation despite her 5 L oxygen. This is consistant with what was seen in the hospital. She is scheduled for  a HRCT 06/17/2016 per Dr. Lake Bells to evaluate for ILD. Additionally she has PFT's scheduled 06/26/2016. She states she is compliant with her Symbicort, Spiriva and Daliresp. We will send in a RX for her neb treatments at her request.I will also treat with Doxy x 1 week as she continues to have thick green secretions to allow for optimal imaging of any additional pulmonary issues on HRCT scheduled 06/17/2016.  Tests CXR 05/25/2016 FINDINGS: Hyperinflation. Apical lordotic positioning. Midline trachea. Cardiomegaly accentuated by AP portable technique. Atherosclerosis in the transverse aorta. No pleural effusion or pneumothorax. Increased peripheral predominant interstitial thickening. Left base volume loss with  scarring or subsegmental atelectasis.  Past medical hx Past Medical History:  Diagnosis Date  . Adenomatous colon polyp   . ALLERGIC RHINITIS   . Anemia    iron deficient  . Anxiety   . Atrial tachycardia (HCC)    Mostly Sinus Tachycardia  . Back pain, chronic   . Carotid stenosis   . CHF (congestive heart failure) ([place])   . Community acquired pneumonia - Recent Admission (07/2013) 07/08/2013  . COPD (chronic obstructive pulmonary disease) ([place])    Requiring Home O2 at 4L  . Depression   . Diabetes mellitus type 2 with neurological manifestations ([place])    On Insulin  . Diverticulosis of colon   . External hemorrhoids   . Fibromyalgia    "pain in arms and shoulders."  . Gastritis   . GERD (gastroesophageal reflux disease)   . Headache(784.0)    tension  . Hyperlipemia   . Hypertension   . Inappropriate sinus tachycardia   . Morbidly obese ([place])   . Nephrolithiasis   . Neuromuscular disorder ([place])    diabetic neuropathy  . Osteoarthritis   . Osteoporosis   . PAF (paroxysmal atrial fibrillation) ([place])   . 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  . Sleep apnea    no cpap machine  02 at 4l/min all the time  . Urosepsis 05/26/2012     Past surgical hx, Family hx, Social hx all reviewed.  Current Outpatient Prescriptions on File Prior to Visit  Medication Sig  . acetaminophen (TYLENOL) 500 MG tablet Take 1,000 mg by mouth every 6 (six) hours as needed for mild pain or headache.   . ALPRAZolam (XANAX) 0.5 MG tablet Take 0.25 mg by mouth at bedtime as needed for sleep.   Marland Kitchen atorvastatin (LIPITOR) 40 MG tablet Take 40 mg by mouth at bedtime.   . benzonatate (TESSALON) 200 MG capsule Take 200 mg by mouth at bedtime.   Marland Kitchen buPROPion (WELLBUTRIN XL) 300 MG 24 hr tablet Take 300 mg by mouth daily.  Marland Kitchen docusate sodium (COLACE) 100 MG capsule Take 100 mg by mouth 2 (two) times daily as needed for mild constipation.  . DULoxetine (CYMBALTA) 60 MG capsule Take 60 mg by mouth daily.   . furosemide  (LASIX) 40 MG tablet Take 80 mg (2 Tabs) in the AM, take 40 mg (1 Tab) in the PM.  . gabapentin (NEURONTIN) 600 MG tablet Take 600 mg by mouth 3 (three) times daily.   . Guaifenesin (MUCINEX MAXIMUM STRENGTH) 1200 MG TB12 Take 1,200 mg by mouth every 12 (twelve) hours.  Marland Kitchen HYDROcodone-acetaminophen (NORCO/VICODIN) 5-325 MG per tablet Take 1 tablet by mouth every 4 (four) hours as needed for moderate pain. Reported on 12/12/2015  . insulin glargine (LANTUS) 100 UNIT/ML injection Inject 0.66 mLs (66 Units total) into the skin daily before breakfast. (Patient taking differently: Inject 64 Units into the skin daily  before breakfast. )  . insulin lispro (HUMALOG) 100 UNIT/ML injection Inject 6 Units into the skin See admin instructions. Inject 6 units subcutaneously twice daily (with breakfast and supper) if CBG >150  . irbesartan (AVAPRO) 150 MG tablet Take 1 tablet (150 mg total) by mouth daily. PLEASE CONTACT OFFICE FOR ADDITIONAL REFILLS  . iron polysaccharides (NIFEREX) 150 MG capsule Take 1 capsule (150 mg total) by mouth 2 (two) times daily.  Marland Kitchen levalbuterol (XOPENEX HFA) 45 MCG/ACT inhaler Inhale 1-2 puffs into the lungs every 4 (four) hours as needed for wheezing.  . Multiple Vitamin (MULTIVITAMIN WITH MINERALS) TABS tablet Take 1 tablet by mouth daily.  Marland Kitchen omeprazole (PRILOSEC) 20 MG capsule Take 20 mg by mouth daily after supper.   . ONE TOUCH ULTRA TEST test strip 1 each by Other route 3 (three) times daily.   . OXYGEN Inhale 5 L into the lungs continuous.   . polyethylene glycol (MIRALAX / GLYCOLAX) packet Take 17 g by mouth daily. Mix in 8 oz liquid and drink  . polyvinyl alcohol (ARTIFICIAL TEARS) 1.4 % ophthalmic solution Place 1 drop into both eyes daily.  . potassium chloride (K-DUR,KLOR-CON) 10 MEQ tablet Take 3 tablets (30 mEq total) by mouth 2 (two) times daily.  . predniSONE (DELTASONE) 20 MG tablet Take 3 tabs daily with breakfast for 5 days, then 2 tabs daily with breakfast for 5 days,  then 1 tab daily for 5 days, then stop  . rivaroxaban (XARELTO) 20 MG TABS tablet Take 1 tablet (20 mg total) by mouth daily with supper. Restart on Wednesday May 11 , 2016  . roflumilast (DALIRESP) 500 MCG TABS tablet Take 500 mcg by mouth daily.  . solifenacin (VESICARE) 5 MG tablet Take 5 mg by mouth daily.  . SYMBICORT 160-4.5 MCG/ACT inhaler INHALE 2 PUFFS BY MOUTH TWICE DAILY  . tiotropium (SPIRIVA) 18 MCG inhalation capsule Place 1 capsule (18 mcg total) into inhaler and inhale every morning. (Patient taking differently: Place 18 mcg into inhaler and inhale daily. )  . verapamil (VERELAN PM) 360 MG 24 hr capsule Take 1 capsule (360 mg total) by mouth daily. KEEP OV.   No current facility-administered medications on file prior to visit.      Allergies  Allergen Reactions  . Aspirin Shortness Of Breath  . Nsaids Shortness Of Breath    Review Of Systems:  Constitutional:   No  weight loss, night sweats,  Fevers, chills, +fatigue, or no  lassitude.  HEENT:   No headaches,  Difficulty swallowing,  Tooth/dental problems, or  Sore throat,                No sneezing, itching, ear ache, nasal congestion, post nasal drip,   CV:  No chest pain,  Orthopnea, PND, swelling in lower extremities, anasarca, dizziness, palpitations, syncope.   GI  No heartburn, indigestion, abdominal pain, nausea, vomiting, diarrhea, change in bowel habits, loss of appetite, bloody stools.   Resp: + shortness of breath with exertion less  at rest.  + excess mucus, no productive cough,  No non-productive cough,  No coughing up of blood.  + change in color of mucus.  No wheezing.  No chest wall deformity  Skin: no rash or lesions.  GU: no dysuria, change in color of urine, no urgency or frequency.  No flank pain, no hematuria   MS:  No joint pain or swelling.  No decreased range of motion.  No back pain.  Psych:  No change in  mood or affect. No depression or anxiety.  No memory loss.   Vital Signs BP  108/60 (BP Location: Left Arm, Cuff Size: Normal)   Pulse (!) 111   Ht 5\' 5"  (1.651 m)   Wt 214 lb 12.8 oz (97.4 kg)   SpO2 94%   BMI 35.74 kg/m    Physical Exam:  General- No distress,  A&Ox3, frail female wearing nasal oxygen ENT: No sinus tenderness, TM clear, pale nasal mucosa, no oral exudate,no post nasal drip, no LAN Cardiac: S1, S2, regular rate and rhythm, no murmur Chest: No wheeze/ rales/ dullness; no accessory muscle use, no nasal flaring, no sternal retractions Abd.: Soft Non-tender Ext: No clubbing cyanosis, trace edema lower extremities Neuro:  Deconditioned at baseline, MAE x 4. appropriate Skin: No rashes, warm and dry Psych: normal mood and behavior   Assessment/Plan  Acute on chronic respiratory failure with hypoxia (HCC) Continue wearing Oxygen at 5 L    COPD exacerbation (HCC) Recent hospital admission for COPD exacerbation Continued desaturations with activity disproportionate to COPD Concern for additional possible cause Plan: We will send you home with a prescription for Doxycycline.. Doxycycline 100 mg twice a day x 7 days We will renew your Albuterol Neb prescription. Remember this is an option when you are having bad days. Continue Symbicort, Spiriva and Daliresp as you have been doing. Rinse mouth after use. You have a CT 06/17/2016. Follow up with Dr. Lake Bells 06/26/2016 as is already scheduled.( PFT's prior) Consider participating in the program for home health nursing visits through Dr. Brigitte Pulse. Please contact office for sooner follow up if symptoms do not improve or worsen or seek emergency care      Magdalen Spatz, NP 06/09/2016  8:21 PM

## 2016-06-09 NOTE — Assessment & Plan Note (Signed)
Recent hospital admission for COPD exacerbation Continued desaturations with activity disproportionate to COPD Concern for additional possible cause Plan: We will send you home with a prescription for Doxycycline.. Doxycycline 100 mg twice a day x 7 days We will renew your Albuterol Neb prescription. Remember this is an option when you are having bad days. Continue Symbicort, Spiriva and Daliresp as you have been doing. Rinse mouth after use. You have a CT 06/17/2016. Follow up with Dr. Lake Bells 06/26/2016 as is already scheduled.( PFT's prior) Consider participating in the program for home health nursing visits through Dr. Brigitte Pulse. Please contact office for sooner follow up if symptoms do not improve or worsen or seek emergency care

## 2016-06-09 NOTE — Assessment & Plan Note (Signed)
Continue wearing Oxygen at 5 L [place]

## 2016-06-09 NOTE — Patient Instructions (Addendum)
It is good to see you today. We will send you home with a prescription for Doxycycline. You have a CT 06/17/2016.Marland Kitchen Doxycycline 100 mg twice a day x 7 days We will renew your Albuterol Neb prescription. Remember this is an option when you are having bad days. Continue Symbicort, Spiriva and Daliresp as you have been doing. Rinse mouth after use. Follow up with Dr. Lake Bells 06/26/2016 as is already scheduled.( PFT's prior) Consider participating in the program for home health nursing visits through Dr. Brigitte Pulse. Please contact office for sooner follow up if symptoms do not improve or worsen or seek emergency care

## 2016-06-10 DIAGNOSIS — N179 Acute kidney failure, unspecified: Secondary | ICD-10-CM | POA: Diagnosis not present

## 2016-06-12 NOTE — Progress Notes (Signed)
Reviewed, agree 

## 2016-06-17 ENCOUNTER — Ambulatory Visit (INDEPENDENT_AMBULATORY_CARE_PROVIDER_SITE_OTHER)
Admission: RE | Admit: 2016-06-17 | Discharge: 2016-06-17 | Disposition: A | Discharge status: Home / Self Care | Payer: Medicare Other | Source: Ambulatory Visit | Attending: Pulmonary Disease | Admitting: Pulmonary Disease

## 2016-06-17 DIAGNOSIS — S2242XD Multiple fractures of ribs, left side, subsequent encounter for fracture with routine healing: Secondary | ICD-10-CM | POA: Diagnosis not present

## 2016-06-17 DIAGNOSIS — R918 Other nonspecific abnormal finding of lung field: Secondary | ICD-10-CM | POA: Diagnosis not present

## 2016-06-17 DIAGNOSIS — Z1231 Encounter for screening mammogram for malignant neoplasm of breast: Secondary | ICD-10-CM | POA: Diagnosis not present

## 2016-06-21 ENCOUNTER — Other Ambulatory Visit: Payer: Self-pay | Admitting: Cardiology

## 2016-06-26 ENCOUNTER — Ambulatory Visit (INDEPENDENT_AMBULATORY_CARE_PROVIDER_SITE_OTHER): Payer: Medicare Other | Admitting: Pulmonary Disease

## 2016-06-26 ENCOUNTER — Other Ambulatory Visit (INDEPENDENT_AMBULATORY_CARE_PROVIDER_SITE_OTHER): Payer: Medicare Other

## 2016-06-26 VITALS — BP 122/84 | HR 99 | Ht 65.0 in | Wt 216.0 lb

## 2016-06-26 DIAGNOSIS — R300 Dysuria: Secondary | ICD-10-CM | POA: Diagnosis not present

## 2016-06-26 DIAGNOSIS — J849 Interstitial pulmonary disease, unspecified: Secondary | ICD-10-CM | POA: Diagnosis not present

## 2016-06-26 DIAGNOSIS — J432 Centrilobular emphysema: Secondary | ICD-10-CM | POA: Diagnosis not present

## 2016-06-26 DIAGNOSIS — J9611 Chronic respiratory failure with hypoxia: Secondary | ICD-10-CM | POA: Diagnosis not present

## 2016-06-26 LAB — PULMONARY FUNCTION TEST
DL/VA % pred: 29 %
DL/VA: 1.44 ml/min/mmHg/L
DLCO UNC: 5.46 ml/min/mmHg
DLCO cor % pred: 20 %
DLCO cor: 5.14 ml/min/mmHg
DLCO unc % pred: 21 %
FEF 25-75 Post: 1.05 L/sec
FEF 25-75 Pre: 0.8 L/sec
FEF2575-%Change-Post: 30 %
FEF2575-%PRED-PRE: 42 %
FEF2575-%Pred-Post: 55 %
FEV1-%Change-Post: 8 %
FEV1-%PRED-POST: 70 %
FEV1-%PRED-PRE: 65 %
FEV1-POST: 1.63 L
FEV1-Pre: 1.5 L
FEV1FVC-%Change-Post: 6 %
FEV1FVC-%PRED-PRE: 85 %
FEV6-%Change-Post: 1 %
FEV6-%PRED-POST: 79 %
FEV6-%PRED-PRE: 77 %
FEV6-POST: 2.32 L
FEV6-Pre: 2.27 L
FEV6FVC-%CHANGE-POST: 0 %
FEV6FVC-%PRED-POST: 103 %
FEV6FVC-%Pred-Pre: 102 %
FVC-%CHANGE-POST: 1 %
FVC-%PRED-POST: 76 %
FVC-%PRED-PRE: 75 %
FVC-POST: 2.33 L
FVC-Pre: 2.3 L
POST FEV6/FVC RATIO: 99 %
PRE FEV1/FVC RATIO: 65 %
Post FEV1/FVC ratio: 70 %
Pre FEV6/FVC Ratio: 99 %
RV % pred: 100 %
RV: 2.28 L
TLC % PRED: 86 %
TLC: 4.48 L

## 2016-06-26 NOTE — Progress Notes (Signed)
Subjective:    Patient ID: Destiny Shelton, female    DOB: 23-Feb-1946, 71 y.o.   MRN: [id number]  Synopsys: Former patient of Dr. Gwenette Greet with pulmonary hypertension, Afib, and COPD.  She smoked 2 packs per day for 25 years and quit in the 1990's.  She has been on oxygen since 2012.  As of 2017 She has been using 5L O2 at rest and 6 L with exertion.   She does not have a history of PE that she is aware of.   pfts 2011:  FEV1 1.34 (55%), ratio 68, ++airtrapping on lung volumes, nl TLC, DLCO 40% pred. PFT's  03/23/2015  FEV1 1.61 (68 % ) ratio 69  p no % improvement from saba on   Symb/spiriva  Echo 2012: nl LV and EF, impaired relaxation, nothing to suggest pulm htn. Myoview 2012: no ischemia LHC 12/2011: non-obstructive cad RHC 12/2011:  PA 45/29 with mean 58mm, PCWP 22, CO Fick 10.25, PVR 1.17 wood units, slight step up in saturation from   RV to PA [place] 12/2011:  Equal 4 chamber pressures, but no square root sign.   Echo A999333:  Nl LV, diastolic dysfxn, normal RV, estimated RVSP 46.  TEE 03/2012:  A few late bubbles seen felt c/w small IP shunting.  CT chest 12/2011:  No PE, very mild mosaic perfusion abnl (prob secondary to her known airflow obstruction).  Cardiac MRI 2013:  No infiltrative process.  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  07/04/2015 RHC: RA mean 9 RV 45/10 PA 41/20, mean 30 PCWP mean 13  Oxygen saturations: SVC 68% RA 65% RV 61% PA 69% LA 98% Peripheral sat 93%  Cardiac Output (Fick) 6.7  Cardiac Index (Fick) 3.3 PVR 2.5 WU Qp/Qs = 0.97 06/2016 HRCT>   Upper lobe predominant fibrotic change and with centrilobular emphysema, no honeycombing January 2018 pulmonary function testing ratio 70%, FEV1 1.63 L 70% predicted, FVC 2.33 L 76% predicted, total lung capacity 4.48 L 86% predicted, DLCO 5. 09/06/2019 percent predicted  HPI Chief Complaint  Patient presents with  . Follow-up    for PFT results. Increased shortness of breath since last  visit.    Destiny Shelton was initially feeling better after her hospitalization, but she started having more shortness of breath a few weeks but her symptoms have started up again.  She has more cough, more mucus production, and she has noticed that her oxygen level will drop down into the 80's when walking.  Dyspnea is worse with exertion.  No fevers or chills.  She continues to use and benefit from her oxygen.  She believes that there may be a little water damage in the house, but she denies any new animals (dogs, cats).  No chemical exposure, no fumes in the house.  She notes that the house is very dusty.   s  She has also been having burning on urination for a few days.    Past Medical History:  Diagnosis Date  . Adenomatous colon polyp   . ALLERGIC RHINITIS   . Anemia    iron deficient  . Anxiety   . Atrial tachycardia (HCC)    Mostly Sinus Tachycardia  . Back pain, chronic   . Carotid stenosis   . CHF (congestive heart failure) ([place])   . Community acquired pneumonia - Recent Admission (07/2013) 07/08/2013  . COPD (chronic obstructive pulmonary disease) ([place])    Requiring Home O2 at 4L  . Depression   .  Diabetes mellitus type 2 with neurological manifestations ([place])    On Insulin  . Diverticulosis of colon   . External hemorrhoids   . Fibromyalgia    "pain in arms and shoulders."  . Gastritis   . GERD (gastroesophageal reflux disease)   . Headache(784.0)    tension  . Hyperlipemia   . Hypertension   . Inappropriate sinus tachycardia   . Morbidly obese ([place])   . Nephrolithiasis   . Neuromuscular disorder ([place])    diabetic neuropathy  . Osteoarthritis   . Osteoporosis   . PAF (paroxysmal atrial fibrillation) ([place])   . 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  . Sleep apnea    no cpap machine  02 at 4l/min all the time  . Urosepsis 05/26/2012      Review of Systems  Constitutional: Negative for chills, diaphoresis, fatigue and fever.  HENT: Negative for postnasal drip, rhinorrhea and  sinus pressure.   Respiratory: Positive for cough and shortness of breath. Negative for wheezing.   Cardiovascular: Negative for chest pain, palpitations and leg swelling.       Objective:   Physical Exam Vitals:   06/26/16 1627  BP: 122/84  Pulse: 99  SpO2: 92%  Weight: 216 lb (98 kg)  Height: 5\' 5"  (1.651 m)   5L [place]   Gen: chronically ill appearing HENT: OP clear, neck supple PULM: few crackles bases B, normal percussion CV: RRR, no mgr, trace edema GI: BS+, soft, nontender Derm: no cyanosis or rash Psyche: normal mood and affect    BMET    Component Value Date/Time   NA 136 06/05/2016 0933   K 4.2 06/05/2016 0933   CL 99 (L) 06/05/2016 0933   CO2 28 06/05/2016 0933   GLUCOSE 248 (H) 06/05/2016 0933   BUN 18 06/05/2016 0933   CREATININE 1.27 (H) 06/05/2016 0933   CREATININE 0.85 02/12/2015 0949   CALCIUM 9.0 06/05/2016 0933   GFRNONAA 41 (L) 06/05/2016 0933   GFRAA 48 (L) 06/05/2016 0933   Records from cardiology reviewed where her diuretic medicines were adjusted for her heart failure in December     Assessment & Plan:      Current Outpatient Prescriptions:  .  acetaminophen (TYLENOL) 500 MG tablet, Take 1,000 mg by mouth every 6 (six) hours as needed for mild pain or headache. , Disp: , Rfl:  .  albuterol (PROVENTIL) (2.5 MG/3ML) 0.083% nebulizer solution, Take 3 mLs (2.5 mg total) by nebulization every 6 (six) hours as needed for wheezing or shortness of breath., Disp: 75 mL, Rfl: 3 .  ALPRAZolam (XANAX) 0.5 MG tablet, Take 0.25 mg by mouth at bedtime as needed for sleep. , Disp: , Rfl:  .  atorvastatin (LIPITOR) 40 MG tablet, Take 40 mg by mouth at bedtime. , Disp: , Rfl:  .  benzonatate (TESSALON) 200 MG capsule, Take 200 mg by mouth at bedtime. , Disp: , Rfl:  .  buPROPion (WELLBUTRIN XL) 300 MG 24 hr tablet, Take 300 mg by mouth daily., Disp: , Rfl:  .  docusate sodium (COLACE) 100 MG capsule, Take 100 mg by mouth 2 (two) times daily as needed for  mild constipation., Disp: , Rfl:  .  doxycycline (VIBRA-TABS) 100 MG tablet, Take 1 tablet (100 mg total) by mouth 2 (two) times daily., Disp: 14 tablet, Rfl: 0 .  DULoxetine (CYMBALTA) 60 MG capsule, Take 60 mg by mouth daily. , Disp: , Rfl:  .  furosemide (LASIX) 40 MG tablet, Take 80 mg (2 Tabs) in  the AM, take 40 mg (1 Tab) in the PM., Disp: 90 tablet, Rfl: 3 .  gabapentin (NEURONTIN) 600 MG tablet, Take 600 mg by mouth 3 (three) times daily. , Disp: , Rfl:  .  Guaifenesin (MUCINEX MAXIMUM STRENGTH) 1200 MG TB12, Take 1,200 mg by mouth every 12 (twelve) hours., Disp: , Rfl:  .  HYDROcodone-acetaminophen (NORCO/VICODIN) 5-325 MG per tablet, Take 1 tablet by mouth every 4 (four) hours as needed for moderate pain. Reported on 12/12/2015, Disp: , Rfl:  .  insulin glargine (LANTUS) 100 UNIT/ML injection, Inject 0.66 mLs (66 Units total) into the skin daily before breakfast. (Patient taking differently: Inject 64 Units into the skin daily before breakfast. ), Disp: , Rfl:  .  insulin lispro (HUMALOG) 100 UNIT/ML injection, Inject 6 Units into the skin See admin instructions. Inject 6 units subcutaneously twice daily (with breakfast and supper) if CBG >150, Disp: , Rfl:  .  irbesartan (AVAPRO) 150 MG tablet, TAKE 1 TABLET BY MOUTH DAILY, Disp: 15 tablet, Rfl: 0 .  iron polysaccharides (NIFEREX) 150 MG capsule, Take 1 capsule (150 mg total) by mouth 2 (two) times daily., Disp: 60 capsule, Rfl: 0 .  levalbuterol (XOPENEX HFA) 45 MCG/ACT inhaler, Inhale 1-2 puffs into the lungs every 4 (four) hours as needed for wheezing., Disp: 1 Inhaler, Rfl: 12 .  Multiple Vitamin (MULTIVITAMIN WITH MINERALS) TABS tablet, Take 1 tablet by mouth daily., Disp: , Rfl:  .  omeprazole (PRILOSEC) 20 MG capsule, Take 20 mg by mouth daily after supper. , Disp: , Rfl:  .  ONE TOUCH ULTRA TEST test strip, 1 each by Other route 3 (three) times daily. , Disp: , Rfl: 4 .  OXYGEN, Inhale 5 L into the lungs continuous. , Disp: , Rfl:    .  polyethylene glycol (MIRALAX / GLYCOLAX) packet, Take 17 g by mouth daily. Mix in 8 oz liquid and drink, Disp: , Rfl:  .  polyvinyl alcohol (ARTIFICIAL TEARS) 1.4 % ophthalmic solution, Place 1 drop into both eyes daily., Disp: , Rfl:  .  potassium chloride (K-DUR,KLOR-CON) 10 MEQ tablet, Take 3 tablets (30 mEq total) by mouth 2 (two) times daily., Disp: 180 tablet, Rfl: 3 .  predniSONE (DELTASONE) 20 MG tablet, Take 3 tabs daily with breakfast for 5 days, then 2 tabs daily with breakfast for 5 days, then 1 tab daily for 5 days, then stop, Disp: 30 tablet, Rfl: 0 .  rivaroxaban (XARELTO) 20 MG TABS tablet, Take 1 tablet (20 mg total) by mouth daily with supper. Restart on Wednesday May 11 , 2016, Disp: 30 tablet, Rfl:  .  roflumilast (DALIRESP) 500 MCG TABS tablet, Take 500 mcg by mouth daily., Disp: , Rfl:  .  SYMBICORT 160-4.5 MCG/ACT inhaler, INHALE 2 PUFFS BY MOUTH TWICE DAILY, Disp: 10.2 g, Rfl: 5 .  tiotropium (SPIRIVA) 18 MCG inhalation capsule, Place 1 capsule (18 mcg total) into inhaler and inhale every morning. (Patient taking differently: Place 18 mcg into inhaler and inhale daily. ), Disp: 30 capsule, Rfl: 11 .  verapamil (VERELAN PM) 360 MG 24 hr capsule, Take 1 capsule (360 mg total) by mouth daily. KEEP OV., Disp: 90 capsule, Rfl: 0

## 2016-06-26 NOTE — Patient Instructions (Signed)
We will call you with the results of the urinalysis I am going to have you come back and see our nurse practitioner in 1-2 weeks to go over the results of the blood test I sent today. If it shows evidence of a condition called hypersensitivity pneumonitis than we will go over what he needs to avoid to prevent her lung disease from getting any worse. However, if the blood test is unrevealing then we will make arrangements for me to perform a bronchoscopy to try to better understand what is causing your interstitial lung disease.

## 2016-06-26 NOTE — Assessment & Plan Note (Signed)
Her high-resolution CT chest this month showed evidence of an interstitial lung disease and was suggestive of chronic hypersensitivity pneumonitis. This condition could certainly explain the severity of her profound hypoxemia and the fact that her symptoms recur quickly after returning home. There is no clear source based on history taking today though they do worry that there may be some mold damage in the home.  Plan: Check hypersensitivity pneumonitis panel If negative, then we will consider a bronchoscopy with BAL to assess for lymphocytes with a CD4 to CD8 ratio.

## 2016-06-26 NOTE — Assessment & Plan Note (Signed)
Continue oxygen as prescribed, 5 L at rest, 6 L with exertion.

## 2016-06-26 NOTE — Assessment & Plan Note (Signed)
She has had many recurrent exacerbations, most recent was about a month ago. She will continue taking Daliresp Symbicort and Spiriva.

## 2016-06-27 LAB — URINALYSIS, ROUTINE W REFLEX MICROSCOPIC
BILIRUBIN URINE: NEGATIVE
HGB URINE DIPSTICK: NEGATIVE
KETONES UR: NEGATIVE
LEUKOCYTES UA: NEGATIVE
Nitrite: NEGATIVE
PH: 6 (ref 5.0–8.0)
Specific Gravity, Urine: 1.015 (ref 1.000–1.030)
Total Protein, Urine: NEGATIVE
Urine Glucose: NEGATIVE
Urobilinogen, UA: 0.2 (ref 0.0–1.0)

## 2016-07-01 ENCOUNTER — Encounter (HOSPITAL_COMMUNITY): Payer: Self-pay

## 2016-07-01 ENCOUNTER — Ambulatory Visit (HOSPITAL_COMMUNITY)
Admission: RE | Admit: 2016-07-01 | Discharge: 2016-07-01 | Disposition: A | Discharge status: Home / Self Care | Payer: Medicare Other | Source: Ambulatory Visit | Attending: Cardiology | Admitting: Cardiology

## 2016-07-01 VITALS — BP 116/58 | HR 91 | Wt 215.8 lb

## 2016-07-01 DIAGNOSIS — E119 Type 2 diabetes mellitus without complications: Secondary | ICD-10-CM | POA: Diagnosis not present

## 2016-07-01 DIAGNOSIS — I110 Hypertensive heart disease with heart failure: Secondary | ICD-10-CM | POA: Insufficient documentation

## 2016-07-01 DIAGNOSIS — Z9981 Dependence on supplemental oxygen: Secondary | ICD-10-CM | POA: Insufficient documentation

## 2016-07-01 DIAGNOSIS — R9431 Abnormal electrocardiogram [ECG] [EKG]: Secondary | ICD-10-CM | POA: Insufficient documentation

## 2016-07-01 DIAGNOSIS — J449 Chronic obstructive pulmonary disease, unspecified: Secondary | ICD-10-CM | POA: Diagnosis not present

## 2016-07-01 DIAGNOSIS — Z79899 Other long term (current) drug therapy: Secondary | ICD-10-CM | POA: Insufficient documentation

## 2016-07-01 DIAGNOSIS — Z794 Long term (current) use of insulin: Secondary | ICD-10-CM | POA: Diagnosis not present

## 2016-07-01 DIAGNOSIS — I272 Pulmonary hypertension, unspecified: Secondary | ICD-10-CM | POA: Insufficient documentation

## 2016-07-01 DIAGNOSIS — J849 Interstitial pulmonary disease, unspecified: Secondary | ICD-10-CM | POA: Diagnosis not present

## 2016-07-01 DIAGNOSIS — R000 Tachycardia, unspecified: Secondary | ICD-10-CM | POA: Diagnosis present

## 2016-07-01 DIAGNOSIS — E669 Obesity, unspecified: Secondary | ICD-10-CM | POA: Diagnosis not present

## 2016-07-01 DIAGNOSIS — I4510 Unspecified right bundle-branch block: Secondary | ICD-10-CM | POA: Insufficient documentation

## 2016-07-01 DIAGNOSIS — Z7901 Long term (current) use of anticoagulants: Secondary | ICD-10-CM | POA: Insufficient documentation

## 2016-07-01 DIAGNOSIS — Z87891 Personal history of nicotine dependence: Secondary | ICD-10-CM | POA: Diagnosis not present

## 2016-07-01 DIAGNOSIS — Z6835 Body mass index (BMI) 35.0-35.9, adult: Secondary | ICD-10-CM | POA: Diagnosis not present

## 2016-07-01 DIAGNOSIS — I5032 Chronic diastolic (congestive) heart failure: Secondary | ICD-10-CM | POA: Insufficient documentation

## 2016-07-01 DIAGNOSIS — I480 Paroxysmal atrial fibrillation: Secondary | ICD-10-CM | POA: Diagnosis not present

## 2016-07-01 MED ORDER — IRBESARTAN 150 MG PO TABS: 150.0000 mg | ORAL_TABLET | Freq: Every day | ORAL | 3 refills | Status: DC

## 2016-07-01 NOTE — Progress Notes (Signed)
Patient ID: Destiny Shelton, female   DOB: 05-May-1946, 71 y.o.   MRN: [id number]  PCP: Dr. Brigitte Pulse HF Cardiology: Dr. Aundra Dubin  71 yo with history of paroxysmal atrial fibrillation, chronic diastolic CHF with restrictive hemodynamics on 2013 RHC, COPD on home oxygen, inappropriate sinus tachycardia, and concern for intracardiac shunting presents for CHF clinic evaluation.  She has been followed for several years by both cardiology and pulmonology.  She is a prior smoker and has been diagnosed with COPD.  Interestingly, her PFTs in 10/16 showed improvement, suggesting mild obstructive airways disease.  However, she still requires home oxygen.  She had transcranial dopplers in 2013 suggesting a medium-sized right to left shunt.  This led to an extensive workup.  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  Cardiac MRI was unremarkable, no evidence for shunt lesion.  RHC/LHC showed no coronary disease but it did show restrictive hemodynamics and volume overload.  Qp/Qs from this study was 1.27/1, suggesting a relatively small left to right shunt.  A definite shunt lesion was never identified.    Echo in 2/17 showed normal LV size and systolic function and normal RV.  I also did a RHC in 2/17: on higher dose diuretics, she had mild pulmonary hypertension and normal PCWP.  There was no evidence for a shunt lesion with Qp/Qs 0.97.   She was admitted in 9/17 with acute exacerbation of COPD.  She was treated with steroids and nebs.  She was admitted in 12/17 with suspected COPD exacerbation, treated with steroids and levofloxacin.  Lasix was decreased to 80 mg once daily but I increased it back to 80 qam/40 qpm when I saw her in followup.    She has been short of breath just walking around the house recently, even with her oxygen.  She was short of breath walking into the office today.  No chest pain.  No lightheadedness/falls.  Chronic orthopnea, has slept in a recliner since the 1990s.  No BRBPR or  melena.    Labs (9/16): digoxin 0.6, K 4.3, creatinine 0.85 Labs (1/17): K 3.9, creatinine 0.96, hgb 8.9 Labs (3/17): K 4.2, creatinine 0.96 Labs (6/17): K 4.2, creatinine 1.03 Labs (8/17): K 4.1, creatinine 0.81, HCT 34.3 Labs (9/17): K 3.7, creatinine 0.91, hgb 12.1 Labs (12/17): K 3.4, creatinine 0.84, hgb 13 Labs (1/18): K 4.2, creatinine 1.27  ECG: Sinus tachycardia at 110, right axis, iRBBB, poor RWP.   PMH: 1. Atrial fibrillation: Paroxysmal.  2. HTN 3. COPD: Home oxygen.  PFTs (10/16) with FVC 77%, FEV1 68%, TLC 89% => mild obstruction (improved from the past).   4. Type II diabetes 5. Obesity 6. Inappropriate sinus tachycardia 7. OSA: Does not use CPAP, uses oxygen at night.  8. Chronic diastolic CHF: RHC/LHC in 0000000 with no significant CAD, mean RA 19, RV 46/18, PA 45/29 mean 37, mean PCWP 22, CI 4.1, PVR 1.4 WU => restrictive hemodynamics with no evidence for constriction;  Qp/Qs 1.27/1; saturation run difficult to interpret.  TEE (10/13): EF 55-60%, LVH, mild MR, bubble study showed no evidence for immediate R=>L bubble crossing, small amount of bubbles ended up in left heart > 5 seconds after injection suggesting possible intrapulmonary shunting; no shunt by color doppler.  Cardiac MRI (8/13) with EF 59%, no LGE, normal RV size/systolic function => interpreted as normal.  RHC 07/04/2015: RA mean 9, RV 45/10, PA 41/20 mean 30, PCWP mean 13, no step up on shunt run,cardiac output (Fick) 6.7 cardiac index (Fick) 3.3,  PVR 2.5 WU, Qp/Qs = 0.97. Echo (2/17) with EF 55-60%, mild LVH, mild MR, normal RV size and systolic function.  9. ?Intracardiac shunt: Transcranial dopplers in 10/13 were suggestive of medium-sized right to left shunt.  Bubble study on TEE in 10/13 showed no evidence for immediate R=>L bubble crossing, small amount of bubbles ended up in left heart > 5 seconds after injection suggesting possible intrapulmonary shunting; no shunt by color doppler. Cardiac MRI showed no  evidence for shunting.  Cardiac cath in 2013 actually was suggestive of a small left to right shunt.  Cardiac cath in 2/17 NOT suggestive of significant shunt, Qp/Qs 0.97.  10. Interstitial lung disease: HRCT in 1/18 was concerning for ILD, most likely hypersensitivity pneumonitis.   SH: Married, prior heavy smoker (quit 1996).  No ETOH.   Family History  Problem Relation Age of Onset  . Heart disease Mother   . Stroke Mother   . Heart disease Father   . Heart disease Brother   . Stroke Brother   . Skin cancer Brother   . Colon polyps Sister   . Diabetes Sister   . Diabetes Brother   . Irritable bowel syndrome Daughter   . Colon cancer Neg Hx    ROS: All systems reviewed and negative except as per HPI.   Current Outpatient Prescriptions  Medication Sig Dispense Refill  . acetaminophen (TYLENOL) 500 MG tablet Take 1,000 mg by mouth every 6 (six) hours as needed for mild pain or headache.     . albuterol (PROVENTIL) (2.5 MG/3ML) 0.083% nebulizer solution Take 3 mLs (2.5 mg total) by nebulization every 6 (six) hours as needed for wheezing or shortness of breath. 75 mL 3  . ALPRAZolam (XANAX) 0.5 MG tablet Take 0.25 mg by mouth at bedtime as needed for sleep.     Marland Kitchen atorvastatin (LIPITOR) 40 MG tablet Take 40 mg by mouth at bedtime.     . benzonatate (TESSALON) 200 MG capsule Take 200 mg by mouth at bedtime.     Marland Kitchen buPROPion (WELLBUTRIN XL) 300 MG 24 hr tablet Take 300 mg by mouth daily.    Marland Kitchen docusate sodium (COLACE) 100 MG capsule Take 100 mg by mouth 2 (two) times daily as needed for mild constipation.    . DULoxetine (CYMBALTA) 60 MG capsule Take 60 mg by mouth daily.     . furosemide (LASIX) 40 MG tablet Take 80 mg (2 Tabs) in the AM, take 40 mg (1 Tab) in the PM. 90 tablet 3  . gabapentin (NEURONTIN) 600 MG tablet Take 600 mg by mouth 3 (three) times daily.     . Guaifenesin (MUCINEX MAXIMUM STRENGTH) 1200 MG TB12 Take 1,200 mg by mouth every 12 (twelve) hours.    Marland Kitchen  HYDROcodone-acetaminophen (NORCO/VICODIN) 5-325 MG per tablet Take 1 tablet by mouth every 4 (four) hours as needed for moderate pain. Reported on 12/12/2015    . insulin glargine (LANTUS) 100 UNIT/ML injection Inject 0.66 mLs (66 Units total) into the skin daily before breakfast. (Patient taking differently: Inject 64 Units into the skin daily before breakfast. )    . insulin lispro (HUMALOG) 100 UNIT/ML injection Inject 6 Units into the skin See admin instructions. Inject 6 units subcutaneously twice daily (with breakfast and supper) if CBG >150    . irbesartan (AVAPRO) 150 MG tablet Take 1 tablet (150 mg total) by mouth daily. 45 tablet 3  . iron polysaccharides (NIFEREX) 150 MG capsule Take 1 capsule (150 mg total) by mouth  2 (two) times daily. 60 capsule 0  . levalbuterol (XOPENEX HFA) 45 MCG/ACT inhaler Inhale 1-2 puffs into the lungs every 4 (four) hours as needed for wheezing. 1 Inhaler 12  . Multiple Vitamin (MULTIVITAMIN WITH MINERALS) TABS tablet Take 1 tablet by mouth daily.    Marland Kitchen omeprazole (PRILOSEC) 20 MG capsule Take 20 mg by mouth daily after supper.     . ONE TOUCH ULTRA TEST test strip 1 each by Other route 3 (three) times daily.   4  . OXYGEN Inhale 5 L into the lungs continuous.     . polyethylene glycol (MIRALAX / GLYCOLAX) packet Take 17 g by mouth daily. Mix in 8 oz liquid and drink    . polyvinyl alcohol (ARTIFICIAL TEARS) 1.4 % ophthalmic solution Place 1 drop into both eyes daily.    . potassium chloride (K-DUR,KLOR-CON) 10 MEQ tablet Take 3 tablets (30 mEq total) by mouth 2 (two) times daily. 180 tablet 3  . rivaroxaban (XARELTO) 20 MG TABS tablet Take 1 tablet (20 mg total) by mouth daily with supper. Restart on Wednesday May 11 , 2016 30 tablet   . roflumilast (DALIRESP) 500 MCG TABS tablet Take 500 mcg by mouth daily.    . SYMBICORT 160-4.5 MCG/ACT inhaler INHALE 2 PUFFS BY MOUTH TWICE DAILY 10.2 g 5  . tiotropium (SPIRIVA) 18 MCG inhalation capsule Place 1 capsule (18  mcg total) into inhaler and inhale every morning. (Patient taking differently: Place 18 mcg into inhaler and inhale daily. ) 30 capsule 11  . verapamil (VERELAN PM) 360 MG 24 hr capsule Take 1 capsule (360 mg total) by mouth daily. KEEP OV. 90 capsule 0   No current facility-administered medications for this encounter.    BP (!) 116/58   Pulse 91   Wt 215 lb 12 oz (97.9 kg)   SpO2 94% Comment: on 5L of O2  BMI 35.90 kg/m  General: NAD, obese, wearing oxygen.  Neck: JVP 7 cm, no thyromegaly or thyroid nodule.  Lungs: Distant breath sounds bilaterally.  Mild dry crackles near bases.  CV: Nondisplaced PMI.  Heart mildly tachy, regular S1/S2, no S3/S4, 2/6 HSM LLSB.  Trace ankle edema bilaterally.  No carotid bruit.  Normal pedal pulses.  Abdomen: Soft, nontender, no hepatosplenomegaly, no distention.  Skin: Intact without lesions or rashes.  Neurologic: Alert and oriented x 3.  Psych: Normal affect. Extremities: No clubbing or cyanosis.  HEENT: Normal.   Assessment/Plan: 1. COPD: On home oxygen, 5L at rest and 6L with exertion.  PFTs in 10/16 actually showed only mild obstruction (improved).  Breathing has improved in the past with increased diuresis, suggesting that CHF plays a significant role in her symptoms in addition to COPD.   2. Atrial fibrillation: Paroxysmal, CHADSVASC = 4.  She remains in NSR today and has not felt atrial fibrillation recently.   - She will continue Xarelto.  - She can continue verapamil.     3. Chronic diastolic CHF: NYHA class III symptoms, likely due to combination of CHF and COPD.  Volume looks ok today, so I suspect that her parenchymal lung disease, COPD and ILD, is the major contributor to her symptoms at this time.     -  Continue Lasix 80 qam/40 qpm.  4. ?Shunt lesion: She had transcranial dopplers in 2013 suggesting a medium-sized right to left shunt.  This led to an extensive workup.  TEE in 2013 showed late bubbles, suggestive of possible pulmonary  AVMs.  Cardiac MRI was unremarkable,  no evidence for shunt lesion.  RHC/LHC in 2013 showed no coronary disease but it did show restrictive hemodynamics and volume overload.  Qp/Qs from this study was 1.27/1, suggesting a relatively small left to right shunt.  A definite shunt lesion was never identified.  Based on prior workup, it seems most likely that the right to left shunting by transcranial dopplers and TEE bubble study was due to pulmonary AVMs.  RHC repeated in 2/17 did not show a significant shunt lesion, with Qp/Qs 0.97.  5. Inappropriate sinus tachycardia: Had discussed using Corlanor in the past but interacts with verapamil.  6. Pulmonary hypertension: Mild pulmonary hypertension on RHC with PVR only 2.5 WU.  Probably due to COPD and elevated left atrial pressure.  No specific treatment other than diuresis.  7. Interstitial lung disease: Possible hypersensitivity pneumonitis.  Followed by Dr. Lake Bells.   Followup in 3 months.   Loralie Champagne 07/01/2016

## 2016-07-01 NOTE — Patient Instructions (Signed)
Your physician recommends that you schedule a follow-up appointment in: 3 months.  

## 2016-07-10 DIAGNOSIS — N178 Other acute kidney failure: Secondary | ICD-10-CM | POA: Diagnosis not present

## 2016-07-10 DIAGNOSIS — Z6834 Body mass index (BMI) 34.0-34.9, adult: Secondary | ICD-10-CM | POA: Diagnosis not present

## 2016-07-10 DIAGNOSIS — I509 Heart failure, unspecified: Secondary | ICD-10-CM | POA: Diagnosis not present

## 2016-07-10 DIAGNOSIS — J9611 Chronic respiratory failure with hypoxia: Secondary | ICD-10-CM | POA: Diagnosis not present

## 2016-07-10 DIAGNOSIS — J449 Chronic obstructive pulmonary disease, unspecified: Secondary | ICD-10-CM | POA: Diagnosis not present

## 2016-07-10 DIAGNOSIS — E1129 Type 2 diabetes mellitus with other diabetic kidney complication: Secondary | ICD-10-CM | POA: Diagnosis not present

## 2016-07-14 ENCOUNTER — Other Ambulatory Visit: Payer: Self-pay | Admitting: Adult Health

## 2016-07-14 DIAGNOSIS — J849 Interstitial pulmonary disease, unspecified: Secondary | ICD-10-CM

## 2016-07-15 ENCOUNTER — Ambulatory Visit: Payer: Medicare Other | Admitting: Adult Health

## 2016-07-15 ENCOUNTER — Other Ambulatory Visit: Payer: Medicare Other

## 2016-07-15 DIAGNOSIS — J849 Interstitial pulmonary disease, unspecified: Secondary | ICD-10-CM | POA: Diagnosis not present

## 2016-07-18 ENCOUNTER — Ambulatory Visit (INDEPENDENT_AMBULATORY_CARE_PROVIDER_SITE_OTHER): Payer: Medicare Other | Admitting: Adult Health

## 2016-07-18 ENCOUNTER — Encounter: Payer: Self-pay | Admitting: Adult Health

## 2016-07-18 DIAGNOSIS — J849 Interstitial pulmonary disease, unspecified: Secondary | ICD-10-CM

## 2016-07-18 DIAGNOSIS — J9621 Acute and chronic respiratory failure with hypoxia: Secondary | ICD-10-CM | POA: Diagnosis not present

## 2016-07-18 DIAGNOSIS — J441 Chronic obstructive pulmonary disease with (acute) exacerbation: Secondary | ICD-10-CM | POA: Diagnosis not present

## 2016-07-18 LAB — HYPERSENSITIVITY PNEUMONITIS
A. FUMIGATUS #1 ABS: NEGATIVE
A. PULLULANS ABS: NEGATIVE
Micropolyspora faeni, IgG: NEGATIVE
PIGEON SERUM ABS: NEGATIVE
THERMOACTINOMYCES VULGARIS IGG: NEGATIVE
Thermoact. Saccharii: NEGATIVE

## 2016-07-18 MED ORDER — AZITHROMYCIN 250 MG PO TABS: ORAL_TABLET | ORAL | 0 refills | Status: AC

## 2016-07-18 NOTE — Assessment & Plan Note (Signed)
Progressive ILD changes , neg HSP panel  Case discussed with Dr . Lake Bells , pt to be set up for FOB next week w/ BAL  Benefits/risk reviewed with pt including dangers of procedure  Xarelto held 48hr prior to FOB

## 2016-07-18 NOTE — Assessment & Plan Note (Deleted)
Recurrent flare -mild  Will treat with Zpack  mucinex As needed   Continue on symbicort and spiriva .

## 2016-07-18 NOTE — Progress Notes (Signed)
_0  ID: Destiny Shelton, female    DOB: 1946-05-20, 71 y.o.   MRN: [id number]  Chief Complaint  Patient presents with  . Follow-up    ILD     Referring provider: Marton Redwood, MD  HPI: 71 year old female former smoker followed for COPD, pulmonary hypertension and ILD  TEST  06/2016 HRCT Chest >Fibrotic interstitial lung disease characterized by patchy peribronchovascular and subpleural reticulation, mild traction bronchiectasis, mild architectural distortion, mosaic attenuation with air trapping and scattered predominantly centrilobular nodularity. No frank honeycombing. No clear basilar gradient. Findings have progressed compared to 06/27/2011 chest CT, with minimal interval progression since 01/22/2016. Findings are favored represent chronic hypersensitivity pneumonitis. Findings are not consistent with usual interstitial pneumonia (UIP). 3. Mild centrilobular and paraseptal emphysema with mild diffuse bronchial wall thickening, suggesting COPD . pfts 2011:  FEV1 1.34 (55%), ratio 68, ++airtrapping on lung volumes, nl TLC, DLCO 40% pred. PFT's  03/23/2015  FEV1 1.61 (68 % ) ratio 69  p no % improvement from saba on   Symb/spiriva  Echo 2012: nl LV and EF, impaired relaxation, nothing to suggest pulm htn. Myoview 2012: no ischemia LHC 12/2011: non-obstructive cad RHC 12/2011:  PA 45/29 with mean 30m, PCWP 22, CO Fick 10.25, PVR 1.17 wood units, slight step up in saturation from                         RV to PA RSwisher7/2013:  Equal 4 chamber pressures, but no square root sign.   Echo 74/01/8118  Nl LV, diastolic dysfxn, normal RV, estimated RVSP 46.  TEE 03/2012:  A few late bubbles seen felt c/w small IP shunting.  CT chest 12/2011:  No PE, very mild mosaic perfusion abnl (prob secondary to her known airflow obstruction).  Cardiac MRI 2013:  No infiltrative process.  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  07/04/2015 RHC: RA mean 9 RV  45/10 PA 41/20, mean 30 PCWP mean 13  Oxygen saturations: SVC 68% RA 65% RV 61% PA 69% LA 98% Peripheral sat 93%  Cardiac Output (Fick) 6.7  Cardiac Index (Fick) 3.3 PVR 2.5 WU Qp/Qs = 0.97 06/2016 HRCT>   Upper lobe predominant fibrotic change and with centrilobular emphysema, no honeycombing January 2018 pulmonary function testing ratio 70%, FEV1 1.63 L 70% predicted, FVC 2.33 L 76% predicted, total lung capacity 4.48 L 86% predicted, DLCO 5. 09/06/2019 percent predicted    07/18/2016 Follow up: COPD/ILD /O2 RF  Presents for a 2 week follow-up. She has been having progressive DOE. Continues to be very sob with minimal activity . PFT last month showed DLCO at 21% and CT chest showed fibrotic ILD with progressive changes.  Hypersensitivity panel was done last ov , and was neg.  It was discussed last ov if this test was neg we would look to Bronchoscopy with BAL to assess for lymphocytes w/ Cd4 to CD 8 ratio . We discussed the procedure with benefit and risk including infection, bleeding , pneumothorax , death. She verbalizes under standing .   She remains on Symbicort and Spiriva for her COPD . Says over last week she has increased cough and congestion with thick yellow . Feels she needs abx so it does not get worse.  Cough is throughout day . No fever, hemoptyis , n/v.d. Or increased edema.   She requires oxygen 5l/m rest and 6l/m act. Says she has had less drops in her oxyge.  Remains on xarelto for hx of A Fib . We discussed this will need to be held 48hr prior to proceduer.     Allergies  Allergen Reactions  . Aspirin Shortness Of Breath  . Nsaids Shortness Of Breath    Immunization History  Administered Date(s) Administered  . Influenza Split 02/17/2011, 02/19/2012, 02/24/2015  . Influenza, High Dose Seasonal PF 03/10/2016  . Influenza,inj,Quad PF,36+ Mos 03/08/2013  . Influenza-Unspecified 01/31/2014  . Pneumococcal Conjugate-13 08/18/2008    Past Medical History:  Diagnosis  Date  . Adenomatous colon polyp   . ALLERGIC RHINITIS   . Anemia    iron deficient  . Anxiety   . Atrial tachycardia (HCC)    Mostly Sinus Tachycardia  . Back pain, chronic   . Carotid stenosis   . CHF (congestive heart failure) ([place])   . Community acquired pneumonia - Recent Admission (07/2013) 07/08/2013  . COPD (chronic obstructive pulmonary disease) ([place])    Requiring Home O2 at 4L  . Depression   . Diabetes mellitus type 2 with neurological manifestations ([place])    On Insulin  . Diverticulosis of colon   . External hemorrhoids   . Fibromyalgia    "pain in arms and shoulders."  . Gastritis   . GERD (gastroesophageal reflux disease)   . Headache(784.0)    tension  . Hyperlipemia   . Hypertension   . Inappropriate sinus tachycardia   . Morbidly obese ([place])   . Nephrolithiasis   . Neuromuscular disorder ([place])    diabetic neuropathy  . Osteoarthritis   . Osteoporosis   . PAF (paroxysmal atrial fibrillation) ([place])   . 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  . Sleep apnea    no cpap machine  02 at 4l/min all the time  . Urosepsis 05/26/2012    Tobacco History: History  Smoking Status  . Former Smoker  . Packs/day: 2.00  . Years: 30.00  . Types: Cigarettes  . Quit date: 06/02/1994  Smokeless Tobacco  . Never Used   Counseling given: Not Answered   Outpatient Encounter Prescriptions as of 07/18/2016  Medication Sig  . acetaminophen (TYLENOL) 500 MG tablet Take 1,000 mg by mouth every 6 (six) hours as needed for mild pain or headache.   . albuterol (PROVENTIL) (2.5 MG/3ML) 0.083% nebulizer solution Take 3 mLs (2.5 mg total) by nebulization every 6 (six) hours as needed for wheezing or shortness of breath.  . ALPRAZolam (XANAX) 0.5 MG tablet Take 0.25 mg by mouth at bedtime as needed for sleep.   Marland Kitchen atorvastatin (LIPITOR) 40 MG tablet Take 40 mg by mouth at bedtime.   . benzonatate (TESSALON) 200 MG capsule Take 200 mg by mouth at bedtime.   Marland Kitchen buPROPion (WELLBUTRIN XL) 300 MG 24 hr  tablet Take 300 mg by mouth daily.  Marland Kitchen docusate sodium (COLACE) 100 MG capsule Take 100 mg by mouth 2 (two) times daily as needed for mild constipation.  . DULoxetine (CYMBALTA) 60 MG capsule Take 60 mg by mouth daily.   . furosemide (LASIX) 40 MG tablet Take 80 mg (2 Tabs) in the AM, take 40 mg (1 Tab) in the PM.  . gabapentin (NEURONTIN) 600 MG tablet Take 600 mg by mouth 3 (three) times daily.   . Guaifenesin (MUCINEX MAXIMUM STRENGTH) 1200 MG TB12 Take 1,200 mg by mouth every 12 (twelve) hours.  Marland Kitchen HYDROcodone-acetaminophen (NORCO/VICODIN) 5-325 MG per tablet Take 1 tablet by mouth every 4 (four) hours as needed for moderate pain. Reported on 12/12/2015  . insulin glargine (  LANTUS) 100 UNIT/ML injection Inject 0.66 mLs (66 Units total) into the skin daily before breakfast. (Patient taking differently: Inject 64 Units into the skin daily before breakfast. )  . insulin lispro (HUMALOG) 100 UNIT/ML injection Inject 6 Units into the skin See admin instructions. Inject 6 units subcutaneously twice daily (with breakfast and supper) if CBG >150  . irbesartan (AVAPRO) 150 MG tablet Take 1 tablet (150 mg total) by mouth daily.  . iron polysaccharides (NIFEREX) 150 MG capsule Take 1 capsule (150 mg total) by mouth 2 (two) times daily.  Marland Kitchen levalbuterol (XOPENEX HFA) 45 MCG/ACT inhaler Inhale 1-2 puffs into the lungs every 4 (four) hours as needed for wheezing.  . Multiple Vitamin (MULTIVITAMIN WITH MINERALS) TABS tablet Take 1 tablet by mouth daily.  Marland Kitchen omeprazole (PRILOSEC) 20 MG capsule Take 20 mg by mouth daily after supper.   . ONE TOUCH ULTRA TEST test strip 1 each by Other route 3 (three) times daily.   . OXYGEN Inhale 5 L into the lungs continuous.   . polyethylene glycol (MIRALAX / GLYCOLAX) packet Take 17 g by mouth daily. Mix in 8 oz liquid and drink  . polyvinyl alcohol (ARTIFICIAL TEARS) 1.4 % ophthalmic solution Place 1 drop into both eyes daily.  . potassium chloride (K-DUR,KLOR-CON) 10 MEQ  tablet Take 3 tablets (30 mEq total) by mouth 2 (two) times daily.  . rivaroxaban (XARELTO) 20 MG TABS tablet Take 1 tablet (20 mg total) by mouth daily with supper. Restart on Wednesday May 11 , 2016  . roflumilast (DALIRESP) 500 MCG TABS tablet Take 500 mcg by mouth daily.  . SYMBICORT 160-4.5 MCG/ACT inhaler INHALE 2 PUFFS BY MOUTH TWICE DAILY  . tiotropium (SPIRIVA) 18 MCG inhalation capsule Place 1 capsule (18 mcg total) into inhaler and inhale every morning. (Patient taking differently: Place 18 mcg into inhaler and inhale daily. )  . verapamil (VERELAN PM) 360 MG 24 hr capsule Take 1 capsule (360 mg total) by mouth daily. KEEP OV.  Marland Kitchen azithromycin (ZITHROMAX Z-PAK) 250 MG tablet Take 2 tablets (500 mg) on  Day 1,  followed by 1 tablet (250 mg) once daily on Days 2 through 5.   No facility-administered encounter medications on file as of 07/18/2016.      Review of Systems  Constitutional:   No  weight loss, night sweats,  Fevers, chills, + fatigue, or  lassitude.  HEENT:   No headaches,  Difficulty swallowing,  Tooth/dental problems, or  Sore throat,                No sneezing, itching, ear ache,  +nasal congestion, post nasal drip,   CV:  No chest pain,  Orthopnea, PND, swelling in lower extremities, anasarca, dizziness, palpitations, syncope.   GI  No heartburn, indigestion, abdominal pain, nausea, vomiting, diarrhea, change in bowel habits, loss of appetite, bloody stools.   Resp:  .  No wheezing.  No chest wall deformity  Skin: no rash or lesions.  GU: no dysuria, change in color of urine, no urgency or frequency.  No flank pain, no hematuria   MS:  No joint pain or swelling.  No decreased range of motion.  No back pain.    Physical Exam  BP 102/62 (BP Location: Left Arm, Cuff Size: Normal)   Ht _0  (1.651 m)   Wt 218 lb (98.9 kg)   SpO2 91%   BMI 36.28 kg/m   GEN: A/Ox3; pleasant , NAD, eldelry on O2    HEENT:  /AT,  EACs-clear, TMs-wnl, NOSE-clear,  THROAT-clear, no lesions, no postnasal drip or exudate noted.   NECK:  Supple w/ fair ROM; no JVD; normal carotid impulses w/o bruits; no thyromegaly or nodules palpated; no lymphadenopathy.    RESP  BB crackles , . no accessory muscle use, no dullness to percussion  CARD:  RRR, no m/r/g, tr  peripheral edema, pulses intact, no cyanosis or clubbing.  GI:   Soft & nt; nml bowel sounds; no organomegaly or masses detected.   Musco: Warm bil, no deformities or joint swelling noted.   Neuro: alert, no focal deficits noted.    Skin: Warm, no lesions or rashes    Lab Results:  CBC Imaging: No results found.   Assessment & Plan:   COPD with acute exacerbation ([place]) Recurrent flare -mild  Will treat with Zpack  mucinex As needed   Continue on symbicort and spiriva .   ILD (interstitial lung disease) (HCC) Progressive ILD changes , neg HSP panel  Case discussed with Dr . Lake Bells , pt to be set up for FOB next week w/ BAL  Benefits/risk reviewed with pt including dangers of procedure  Xarelto held 48hr prior to FOB   Acute on chronic respiratory failure with hypoxia ([place]) Cont on oxygen      Rexene Edison, NP 07/18/2016

## 2016-07-18 NOTE — Assessment & Plan Note (Signed)
Recurrent flare -mild  Will treat with Zpack  mucinex As needed   Continue on symbicort and spiriva .

## 2016-07-18 NOTE — Assessment & Plan Note (Signed)
Cont on oxygen  

## 2016-07-18 NOTE — Patient Instructions (Addendum)
Zpack take as directed.  We are going to set you up for a Bronchoscopy .  You should hold Xarelto 48 hr prior to your procedure   Continue on Oxygen 5l/m rest and 6l/m walking. Follow up Dr. Lake Bells in 6 weeks and As needed.

## 2016-07-21 ENCOUNTER — Telehealth: Payer: Self-pay | Admitting: Pulmonary Disease

## 2016-07-21 NOTE — Telephone Encounter (Signed)
Spoke with pt. Advised her that she would need to contact the respiratory department about this. The phone number has been given to her. Nothing further was needed.

## 2016-07-21 NOTE — Progress Notes (Signed)
Reviewed and discussed with Tammy Parrett.  She has had progressive severe hypoxemia, I worry about the possibility of hypersensitivity pneumonitis.  Will plan for BAL only bronchoscopy on 2/20 PM at [facility].

## 2016-07-22 ENCOUNTER — Ambulatory Visit (HOSPITAL_COMMUNITY)
Admission: RE | Admit: 2016-07-22 | Discharge: 2016-07-22 | Disposition: A | Discharge status: Home / Self Care | Payer: Medicare Other | Source: Ambulatory Visit | Attending: Pulmonary Disease | Admitting: Pulmonary Disease

## 2016-07-22 ENCOUNTER — Encounter (HOSPITAL_COMMUNITY)
Admission: RE | Disposition: A | Discharge status: Home / Self Care | Payer: Self-pay | Source: Ambulatory Visit | Attending: Pulmonary Disease

## 2016-07-22 DIAGNOSIS — I110 Hypertensive heart disease with heart failure: Secondary | ICD-10-CM | POA: Insufficient documentation

## 2016-07-22 DIAGNOSIS — Z7901 Long term (current) use of anticoagulants: Secondary | ICD-10-CM | POA: Diagnosis not present

## 2016-07-22 DIAGNOSIS — F329 Major depressive disorder, single episode, unspecified: Secondary | ICD-10-CM | POA: Diagnosis not present

## 2016-07-22 DIAGNOSIS — I509 Heart failure, unspecified: Secondary | ICD-10-CM | POA: Insufficient documentation

## 2016-07-22 DIAGNOSIS — Z9981 Dependence on supplemental oxygen: Secondary | ICD-10-CM | POA: Insufficient documentation

## 2016-07-22 DIAGNOSIS — D509 Iron deficiency anemia, unspecified: Secondary | ICD-10-CM | POA: Insufficient documentation

## 2016-07-22 DIAGNOSIS — J432 Centrilobular emphysema: Secondary | ICD-10-CM | POA: Diagnosis not present

## 2016-07-22 DIAGNOSIS — E1140 Type 2 diabetes mellitus with diabetic neuropathy, unspecified: Secondary | ICD-10-CM | POA: Diagnosis not present

## 2016-07-22 DIAGNOSIS — F419 Anxiety disorder, unspecified: Secondary | ICD-10-CM | POA: Insufficient documentation

## 2016-07-22 DIAGNOSIS — I480 Paroxysmal atrial fibrillation: Secondary | ICD-10-CM | POA: Diagnosis not present

## 2016-07-22 DIAGNOSIS — Z87891 Personal history of nicotine dependence: Secondary | ICD-10-CM | POA: Diagnosis not present

## 2016-07-22 DIAGNOSIS — Z79899 Other long term (current) drug therapy: Secondary | ICD-10-CM | POA: Diagnosis not present

## 2016-07-22 DIAGNOSIS — J9610 Chronic respiratory failure, unspecified whether with hypoxia or hypercapnia: Secondary | ICD-10-CM | POA: Insufficient documentation

## 2016-07-22 DIAGNOSIS — Z7951 Long term (current) use of inhaled steroids: Secondary | ICD-10-CM | POA: Insufficient documentation

## 2016-07-22 DIAGNOSIS — J209 Acute bronchitis, unspecified: Secondary | ICD-10-CM | POA: Diagnosis not present

## 2016-07-22 DIAGNOSIS — Z794 Long term (current) use of insulin: Secondary | ICD-10-CM | POA: Diagnosis not present

## 2016-07-22 DIAGNOSIS — J449 Chronic obstructive pulmonary disease, unspecified: Secondary | ICD-10-CM | POA: Insufficient documentation

## 2016-07-22 DIAGNOSIS — G4730 Sleep apnea, unspecified: Secondary | ICD-10-CM | POA: Insufficient documentation

## 2016-07-22 DIAGNOSIS — J849 Interstitial pulmonary disease, unspecified: Secondary | ICD-10-CM | POA: Diagnosis not present

## 2016-07-22 HISTORY — PX: VIDEO BRONCHOSCOPY: SHX5072

## 2016-07-22 LAB — BODY FLUID CELL COUNT WITH DIFFERENTIAL
Eos, Fluid: 3 %
LYMPHS FL: 1 %
MONOCYTE-MACROPHAGE-SEROUS FLUID: 7 % — AB (ref 50–90)
Neutrophil Count, Fluid: 89 % — ABNORMAL HIGH (ref 0–25)
Total Nucleated Cell Count, Fluid: UNDETERMINED cu mm (ref 0–1000)

## 2016-07-22 LAB — GLUCOSE, CAPILLARY: Glucose-Capillary: 106 mg/dL — ABNORMAL HIGH (ref 65–99)

## 2016-07-22 SURGERY — BRONCHOSCOPY, WITH FLUOROSCOPY
Anesthesia: Moderate Sedation | Laterality: Bilateral

## 2016-07-22 MED ORDER — MIDAZOLAM HCL 10 MG/2ML IJ SOLN
INTRAMUSCULAR | Status: DC | PRN
  Administered 2016-07-22: 1 mg via INTRAVENOUS

## 2016-07-22 MED ORDER — FENTANYL CITRATE (PF) 100 MCG/2ML IJ SOLN
INTRAMUSCULAR | Status: DC | PRN
  Administered 2016-07-22: 25 ug via INTRAVENOUS

## 2016-07-22 MED ORDER — BUTAMBEN-TETRACAINE-BENZOCAINE 2-2-14 % EX AERO: 1.0000 | INHALATION_SPRAY | Freq: Once | CUTANEOUS | Status: DC

## 2016-07-22 MED ORDER — SODIUM CHLORIDE 0.9 % IV SOLN
INTRAVENOUS | Status: DC
  Administered 2016-07-22: 13:00:00 via INTRAVENOUS

## 2016-07-22 MED ORDER — LIDOCAINE HCL 1 % IJ SOLN
INTRAMUSCULAR | Status: DC | PRN
  Administered 2016-07-22: 6 mL

## 2016-07-22 MED ORDER — PHENYLEPHRINE HCL 0.25 % NA SOLN: 1.0000 | Freq: Four times a day (QID) | NASAL | Status: DC | PRN

## 2016-07-22 MED ORDER — PHENYLEPHRINE HCL 0.25 % NA SOLN
NASAL | Status: DC | PRN
  Administered 2016-07-22: 2 via NASAL

## 2016-07-22 MED ORDER — FENTANYL CITRATE (PF) 100 MCG/2ML IJ SOLN
INTRAMUSCULAR | Status: AC
  Filled 2016-07-22: qty 4

## 2016-07-22 MED ORDER — MIDAZOLAM HCL 5 MG/ML IJ SOLN
INTRAMUSCULAR | Status: AC
  Filled 2016-07-22: qty 2

## 2016-07-22 MED ORDER — LIDOCAINE HCL 2 % EX GEL: 1.0000 "application " | Freq: Once | CUTANEOUS | Status: DC

## 2016-07-22 MED ORDER — LIDOCAINE HCL 2 % EX GEL
CUTANEOUS | Status: DC | PRN
  Administered 2016-07-22: 1

## 2016-07-22 MED ORDER — DOXYCYCLINE HYCLATE 50 MG PO CAPS: 100.0000 mg | ORAL_CAPSULE | Freq: Two times a day (BID) | ORAL | 0 refills | Status: DC

## 2016-07-22 NOTE — H&P (Signed)
[facility]  HPI: Destiny Shelton is well known to me from clinic for chronc respiratory failure with hypomia.  This had been attributed to her COPD for years but recently we discovered that she has ILD which likely contributes to her disease.  Radiology feels she may have hypersensitivity pneumonitis.  Her Xarelto is on hold as of two days ago.  Past Medical History:  Diagnosis Date  . Adenomatous colon polyp   . ALLERGIC RHINITIS   . Anemia    iron deficient  . Anxiety   . Atrial tachycardia (HCC)    Mostly Sinus Tachycardia  . Back pain, chronic   . Carotid stenosis   . CHF (congestive heart failure) ([place])   . Community acquired pneumonia - Recent Admission (07/2013) 07/08/2013  . COPD (chronic obstructive pulmonary disease) ([place])    Requiring Home O2 at 4L  . Depression   . Diabetes mellitus type 2 with neurological manifestations ([place])    On Insulin  . Diverticulosis of colon   . External hemorrhoids   . Fibromyalgia    "pain in arms and shoulders."  . Gastritis   . GERD (gastroesophageal reflux disease)   . Headache(784.0)    tension  . Hyperlipemia   . Hypertension   . Inappropriate sinus tachycardia   . Morbidly obese ([place])   . Nephrolithiasis   . Neuromuscular disorder ([place])    diabetic neuropathy  . Osteoarthritis   . Osteoporosis   . PAF (paroxysmal atrial fibrillation) ([place])   . 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  . Sleep apnea    no cpap machine  02 at 4l/min all the time  . Urosepsis 05/26/2012     Family History  Problem Relation Age of Onset  . Heart disease Mother   . Stroke Mother   . Heart disease Father   . Heart disease Brother   . Stroke Brother   . Skin cancer Brother   . Colon polyps Sister   . Diabetes Sister   . Diabetes Brother   . Irritable bowel syndrome Daughter   . Colon cancer Neg Hx      Social History   Social History  . Marital status: Married    Spouse name: N/A  . Number of children: 2  . Years of education: HS   Occupational  History  . Disability    Social History Main Topics  . Smoking status: Former Smoker    Packs/day: 2.00    Years: 30.00    Types: Cigarettes    Quit date: 06/02/1994  . Smokeless tobacco: Never Used  . Alcohol use No  . Drug use: No  . Sexual activity: No   Other Topics Concern  . Not on file   Social History Narrative   She is a married mother of 2, grandmother of 79, great-grandmother of 2. She is with her husband here today. She does not really exercise as she is on home oxygen and has baseline dyspnea.    Does not smoke. Does not drink. She quit smoking somewhere between 10-15 years ago. She is now trying to work on picking up her activity level. She is trying to I think more than likely get back into pulmonary rehabilitation potentially.       Daily caffeine.      Allergies  Allergen Reactions  . Aspirin Shortness Of Breath  . Nsaids Shortness Of Breath     _0 @ Vitals:   07/22/16 1250 07/22/16 1255 07/22/16 1300 07/22/16 1305  BP: (!) 134/48 Marland Kitchen)  141/54 (!) 139/56 138/70  Pulse:      Resp: (!) 22 (!) _0 Temp:      TempSrc:      SpO2: 94% 94% 94% 97%   5L [place]  Gen: chronically ill appearing HENT: OP clear, TM's clear, neck supple PULM: Crackles RUL, normal effort, normal percussion CV: RRR, no mgr, trace edema GI: BS+, soft, nontender Derm: no cyanosis or rash Psyche: normal mood and affect  CBC    Component Value Date/Time   WBC 11.4 (H) 05/28/2016 0545   RBC 4.44 05/28/2016 0545   HGB 13.0 05/28/2016 0545   HCT 40.8 05/28/2016 0545   PLT 176 05/28/2016 0545   MCV 91.9 05/28/2016 0545   MCH 29.3 05/28/2016 0545   MCHC 31.9 05/28/2016 0545   RDW 14.5 05/28/2016 0545   LYMPHSABS 1.0 05/28/2016 0545   MONOABS 0.4 05/28/2016 0545   EOSABS 0.0 05/28/2016 0545   BASOSABS 0.0 05/28/2016 0545     CT chest images reviewed again showing air trapping and non-specific fibrotic changes most predominantly in the upper lobes with some  centrilobular nodules  Impression/Plan: ILD, concern for hypersensitivity pneumonitis  Bronchoscopy with BAL today    Roselie Awkward, MD [facility] Pager: [phone] Cell: [phone] After 3pm or if no response, call [phone]

## 2016-07-22 NOTE — Discharge Instructions (Signed)
Flexible Bronchoscopy, Care After These instructions give you information on caring for yourself after your procedure. Your doctor may also give you more specific instructions. Call your doctor if you have any problems or questions after your procedure. Follow these instructions at home:  Do not eat or drink anything for 2 hours after your procedure. If you try to eat or drink before the medicine wears off, food or drink could go into your lungs. You could also burn yourself.  After 2 hours have passed and when you can cough and gag normally, you may eat soft food and drink liquids slowly.  The day after the test, you may eat your normal diet.  You may do your normal activities.  Keep all doctor visits. Get help right away if:  You get more and more short of breath.  You get light-headed.  You feel like you are going to pass out (faint).  You have chest pain.  You have new problems that worry you.  You cough up more than a little blood.  You cough up more blood than before. This information is not intended to replace advice given to you by your health care provider. Make sure you discuss any questions you have with your health care provider. Document Released: 03/16/2009 Document Revised: 10/25/2015 Document Reviewed: 01/21/2013 Elsevier Interactive Patient Education  2017 [place].  Nothing to eat or drink until  3:30   pm  Today  07/22/2016

## 2016-07-22 NOTE — Progress Notes (Signed)
[facility] Ms. Ealy notes some increasing chest congestion in the last few days, feels like she may need an antibiotic.  She clearly had bronchitis changes on bronchoscopy today. Will send an Rx for doxycycline to her pharmacy. Roselie Awkward, MD [facility] Pager: [phone] Cell: [phone] After 3pm or if no response, call [phone]

## 2016-07-22 NOTE — Progress Notes (Signed)
Video Bronchoscopy done Intervention Bronchial washing Procedure tolerated well 

## 2016-07-22 NOTE — Op Note (Signed)
[facility] Cardiopulmonary Patient Name: Destiny Shelton Procedure Date: 07/22/2016 MRN: [id number] Attending MD: Juanito Doom , MD Date of Birth: 03-07-1946 CSN: [id number] Age: 71 Admit Type: Outpatient Ethnicity: Not Hispanic or Latino Procedure:            Bronchoscopy Indications:          Interstitial lung disease Providers:            Nathaneil Canary B. Indio Santilli, MD, Andre Lefort RRT,RCP, Cherre Huger RRT, RCP Referring MD:          Medicines:            Midazolam 1 mg IV, Fentanyl 25 mcg IV, Lidocaine                        applied to nares and subglottic space Complications:        No immediate complications Estimated Blood Loss: Estimated blood loss: none. Procedure:      Pre-Anesthesia Assessment:      - A History and Physical has been performed. Patient meds and allergies       have been reviewed. The risks and benefits of the procedure and the       sedation options and risks were discussed with the patient. All       questions were answered and informed consent was obtained. Patient       identification and proposed procedure were verified prior to the       procedure by the physician and the technician in the procedure room.       Mental Status Examination: alert and oriented. Airway Examination:       normal oropharyngeal airway. Respiratory Examination: bibasilar       crackles. CV Examination: normal. ASA Grade Assessment: III - A patient       with severe systemic disease. After reviewing the risks and benefits,       the patient was deemed in satisfactory condition to undergo the       procedure. The anesthesia plan was to use moderate sedation / analgesia       (conscious sedation). Immediately prior to administration of       medications, the patient was re-assessed for adequacy to receive       sedatives. The heart rate, respiratory rate, oxygen saturations, blood       pressure, adequacy of pulmonary ventilation, and  response to care were       monitored throughout the procedure. The physical status of the patient       was re-assessed after the procedure.      After obtaining informed consent, the bronchoscope was passed under       direct vision. Throughout the procedure, the patient's blood pressure,       pulse, and oxygen saturations were monitored continuously. the [id number]       [id number] scope was introduced through the right nostril and advanced to       the tracheobronchial tree. The procedure was accomplished without       difficulty. The patient tolerated the procedure well. The total duration       of the procedure was 3 minutes. Findings:      The larynx was normal in appearance and function and the trachea was  normal in caliber. The scope was passed into the major orifice of each       lobe and no obstructing mass or lesion was seen. Bilateral Lung       Abnormalities: Copious, mucopurulent secretions were found throughout       the tracheobronchial tree. They were not obstructing the airway. BAL was       performed in the RUL anterior segment (B3) and in the right middle lobe       of the lung and sent for cell count, bacterial culture, viral smears &       culture, and fungal & AFB analysis and flow cytometry. 90 mL of fluid       were instilled. 20 mL were returned. The return was cloudy. There were       no mucoid plugs in the return fluid. Multiple specimens were obtained       and pooled into one specimen, which was sent for analysis. Impression:      - Interstitial lung disease      - Copious, mucopurulent secretions were found throughout the       tracheobronchial tree.      - Bronchoalveolar lavage was performed. Moderate Sedation:      Moderate (conscious) sedation was personally administered by the       endoscopist. The following parameters were monitored: oxygen saturation,       heart rate, blood pressure, and response to care. Total physician       intraservice time  was 10 minutes. Recommendation:      - Await BAL results. Procedure Code(s):      --- Professional ---      [phone], Bronchoscopy, rigid or flexible, including fluoroscopic guidance,       when performed; with bronchial alveolar lavage      99152, Moderate sedation services provided by the same physician or       other qualified health care professional performing the diagnostic or       therapeutic service that the sedation supports, requiring the presence       of an independent trained observer to assist in the monitoring of the       patient's level of consciousness and physiological status; initial 15       minutes of intraservice time, patient age 69 years or older Diagnosis Code(s):      --- Professional ---      R09.89, Other specified symptoms and signs involving the circulatory and       respiratory systems      J84.9, Interstitial pulmonary disease, unspecified CPT copyright 2016 American Medical Association. All rights reserved. The codes documented in this report are preliminary and upon coder review may  be revised to meet current compliance requirements. Norlene Campbell, MD Juanito Doom, MD 07/22/2016 1:35:01 PM This report has been signed electronically. Number of Addenda: 0 Scope In: 1:21:31 PM Scope Out: 1:24:41 PM

## 2016-07-23 ENCOUNTER — Telehealth: Payer: Self-pay

## 2016-07-23 ENCOUNTER — Encounter (HOSPITAL_COMMUNITY): Payer: Self-pay | Admitting: Pulmonary Disease

## 2016-07-23 LAB — ACID FAST SMEAR (AFB, MYCOBACTERIA): Acid Fast Smear: NEGATIVE

## 2016-07-23 NOTE — Telephone Encounter (Signed)
Spoke with pt regarding preliminary results.  Pt expressed understanding.  Nothing further needed.

## 2016-07-23 NOTE — Telephone Encounter (Signed)
-----   Message from Juanito Doom, MD sent at 07/23/2016  1:38 PM EST ----- A, Please let her know that so far everything from the bronchoscopy is coming back OK, but we will still follow up her cultures which take a few days to come back. Thanks B

## 2016-07-24 NOTE — Progress Notes (Signed)
Spoke with pt and notified of results per Dr. TP. Pt verbalized understanding and denied any questions. 

## 2016-07-25 ENCOUNTER — Other Ambulatory Visit: Payer: Self-pay

## 2016-07-25 MED ORDER — LEVOFLOXACIN 500 MG PO TABS: 500.0000 mg | ORAL_TABLET | Freq: Every day | ORAL | 0 refills | Status: DC

## 2016-07-26 LAB — CULTURE, RESPIRATORY: SPECIAL REQUESTS: NORMAL

## 2016-07-26 LAB — CULTURE, RESPIRATORY W GRAM STAIN

## 2016-08-06 ENCOUNTER — Telehealth: Payer: Self-pay | Admitting: Pulmonary Disease

## 2016-08-06 MED ORDER — LEVOFLOXACIN 750 MG PO TABS: 750.0000 mg | ORAL_TABLET | Freq: Every day | ORAL | 0 refills | Status: DC

## 2016-08-06 NOTE — Telephone Encounter (Signed)
Rx Levaquin 750 po daily x 5 days Call if no improvement or worse

## 2016-08-06 NOTE — Telephone Encounter (Signed)
Spoke with pt. States that is not feeling well. Reports cough, SOB and wheezing. Cough is producing green mucus. Denies chest tightness or fever. Symptoms started on Friday when she finished Doxy that we prescribed on 07/22/16. Would like BQ's recommendations.  BQ - please advise. Thanks.

## 2016-08-06 NOTE — Telephone Encounter (Signed)
Rx sent to preferred pharmacy. Pt aware and voiced her understanding. Nothing further needed.  

## 2016-08-20 DIAGNOSIS — E1149 Type 2 diabetes mellitus with other diabetic neurological complication: Secondary | ICD-10-CM | POA: Diagnosis not present

## 2016-08-20 DIAGNOSIS — M810 Age-related osteoporosis without current pathological fracture: Secondary | ICD-10-CM | POA: Diagnosis not present

## 2016-08-20 DIAGNOSIS — I10 Essential (primary) hypertension: Secondary | ICD-10-CM | POA: Diagnosis not present

## 2016-08-20 DIAGNOSIS — E784 Other hyperlipidemia: Secondary | ICD-10-CM | POA: Diagnosis not present

## 2016-08-22 LAB — FUNGUS CULTURE WITH STAIN

## 2016-08-22 LAB — FUNGAL ORGANISM REFLEX

## 2016-08-22 LAB — FUNGUS CULTURE RESULT

## 2016-08-27 DIAGNOSIS — J449 Chronic obstructive pulmonary disease, unspecified: Secondary | ICD-10-CM | POA: Diagnosis not present

## 2016-08-27 DIAGNOSIS — Z7901 Long term (current) use of anticoagulants: Secondary | ICD-10-CM | POA: Diagnosis not present

## 2016-08-27 DIAGNOSIS — Z6836 Body mass index (BMI) 36.0-36.9, adult: Secondary | ICD-10-CM | POA: Diagnosis not present

## 2016-08-27 DIAGNOSIS — E1149 Type 2 diabetes mellitus with other diabetic neurological complication: Secondary | ICD-10-CM | POA: Diagnosis not present

## 2016-08-27 DIAGNOSIS — I5032 Chronic diastolic (congestive) heart failure: Secondary | ICD-10-CM | POA: Diagnosis not present

## 2016-08-27 DIAGNOSIS — E1129 Type 2 diabetes mellitus with other diabetic kidney complication: Secondary | ICD-10-CM | POA: Diagnosis not present

## 2016-08-27 DIAGNOSIS — J9611 Chronic respiratory failure with hypoxia: Secondary | ICD-10-CM | POA: Diagnosis not present

## 2016-08-27 DIAGNOSIS — Z0000 Encounter for general adult medical examination without abnormal findings: Secondary | ICD-10-CM | POA: Diagnosis not present

## 2016-08-27 DIAGNOSIS — I10 Essential (primary) hypertension: Secondary | ICD-10-CM | POA: Diagnosis not present

## 2016-08-27 DIAGNOSIS — E784 Other hyperlipidemia: Secondary | ICD-10-CM | POA: Diagnosis not present

## 2016-08-27 DIAGNOSIS — Z1389 Encounter for screening for other disorder: Secondary | ICD-10-CM | POA: Diagnosis not present

## 2016-08-27 DIAGNOSIS — E668 Other obesity: Secondary | ICD-10-CM | POA: Diagnosis not present

## 2016-09-03 LAB — ACID FAST CULTURE WITH REFLEXED SENSITIVITIES (MYCOBACTERIA): Acid Fast Culture: NEGATIVE

## 2016-09-08 ENCOUNTER — Ambulatory Visit (INDEPENDENT_AMBULATORY_CARE_PROVIDER_SITE_OTHER): Payer: Medicare Other | Admitting: Pulmonary Disease

## 2016-09-08 ENCOUNTER — Encounter: Payer: Self-pay | Admitting: Pulmonary Disease

## 2016-09-08 DIAGNOSIS — J432 Centrilobular emphysema: Secondary | ICD-10-CM | POA: Diagnosis not present

## 2016-09-08 DIAGNOSIS — R0602 Shortness of breath: Secondary | ICD-10-CM | POA: Diagnosis not present

## 2016-09-08 DIAGNOSIS — J849 Interstitial pulmonary disease, unspecified: Secondary | ICD-10-CM | POA: Diagnosis not present

## 2016-09-08 DIAGNOSIS — J9611 Chronic respiratory failure with hypoxia: Secondary | ICD-10-CM

## 2016-09-08 DIAGNOSIS — I272 Pulmonary hypertension, unspecified: Secondary | ICD-10-CM

## 2016-09-08 MED ORDER — AZITHROMYCIN 250 MG PO TABS: 250.0000 mg | ORAL_TABLET | Freq: Every day | ORAL | 2 refills | Status: DC

## 2016-09-08 NOTE — Patient Instructions (Signed)
Take azithromycin every day Keep taking your Symbicort and Spiriva Come back in 2 weeks where we will have an EKG test as well as blood test

## 2016-09-08 NOTE — Assessment & Plan Note (Signed)
Complicated by profound deconditioning. Encouraged to start exercising for

## 2016-09-08 NOTE — Assessment & Plan Note (Signed)
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 We will plan on repeating a lung function test in July 2018.

## 2016-09-08 NOTE — Assessment & Plan Note (Signed)
Her pulmonary hypertension is due to her chronic lung disease. We will continue treating with oxygen and focusing treatment on her lung disease. There is no role for a pulmonary vasodilator.

## 2016-09-08 NOTE — Assessment & Plan Note (Signed)
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 Her bronchoscopy from February 2018 showed that she is culture positive for Pseudomonas which is often associated with more severe disease and more severe symptoms. A bronchoscopy was also noted for profound mucopurulent bronchitis. This likely contributes to her symptoms of wheezing, dyspnea, and hypoxemia.  After lengthy discussion, we've decided to treat her with daily azithromycin. This is been associated with improved outcomes in patients who have recurrent exacerbations of COPD. While not often considered a Pseudomonas treatment, the cystic fibrosis population we know that patients who have Pseudomonas colonization tend to do better with daily azithromycin treatment because it is thought to breakdown the biofilm secreted by that bacteria.  Plan: Start azithromycin 250 mg daily indefinitely Counseled today at length to stop immediately if she feels a sensation of palpitations or hearing change Follow-up 2 weeks with an EKG and liver function test to make sure there is no evidence of toxicity Continue Symbicort and Spiriva Exercise regularly  Greater 50% of this 20 minute visit spent face-to-face

## 2016-09-08 NOTE — Progress Notes (Signed)
Subjective:    Patient ID: Destiny Shelton, female    DOB: 11/11/1945, 71 y.o.   MRN: [id number]  Synopsys: Former patient of Dr. Gwenette Greet with pulmonary hypertension, Afib, and COPD.  She smoked 2 packs per day for 25 years and quit in the 1990's.  She has been on oxygen since 2012.  As of 2017 She has been using 5L O2 at rest and 6 L with exertion.   She does not have a history of PE that she is aware of.    HPI Chief Complaint  Patient presents with  . Follow-up    pt has had no improvement since hospitalization- sob with any exertion, prod cough with white/yellow/green mucus, fatigue, weakness.    Destiny Shelton says she is really struggling with dyspnea. She says that she can't breathe most of the time. Any exertion makes her dyspneic. She coughs up green to white mucus, it changes colors some.  She took Levaquin but she can't remember if it helped much.   She continues to take [employer] and Xopenex.  She is using the Xopenex more frequently due to severe dyspnea. She says that she is using the Xopenex 2-3 times per week.  It helps    Past Medical History:  Diagnosis Date  . Adenomatous colon polyp   . ALLERGIC RHINITIS   . Anemia    iron deficient  . Anxiety   . Atrial tachycardia (HCC)    Mostly Sinus Tachycardia  . Back pain, chronic   . Carotid stenosis   . CHF (congestive heart failure) ([place])   . Community acquired pneumonia - Recent Admission (07/2013) 07/08/2013  . COPD (chronic obstructive pulmonary disease) ([place])    Requiring Home O2 at 4L  . Depression   . Diabetes mellitus type 2 with neurological manifestations ([place])    On Insulin  . Diverticulosis of colon   . External hemorrhoids   . Fibromyalgia    "pain in arms and shoulders."  . Gastritis   . GERD (gastroesophageal reflux disease)   . Headache(784.0)    tension  . Hyperlipemia   . Hypertension   . Inappropriate sinus tachycardia   . Morbidly obese ([place])   . Nephrolithiasis   . Neuromuscular  disorder ([place])    diabetic neuropathy  . Osteoarthritis   . Osteoporosis   . PAF (paroxysmal atrial fibrillation) ([place])   . 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  . Sleep apnea    no cpap machine  02 at 4l/min all the time  . Urosepsis 05/26/2012      Review of Systems  Constitutional: Negative for chills, diaphoresis, fatigue and fever.  HENT: Negative for postnasal drip, rhinorrhea and sinus pressure.   Respiratory: Positive for cough and shortness of breath. Negative for wheezing.   Cardiovascular: Negative for chest pain, palpitations and leg swelling.       Objective:   Physical Exam Vitals:   09/08/16 1630  BP: 114/64  Pulse: (!) 112  SpO2: 92%   5L [place]   Gen: chronically ill appearing HENT: OP clear, TM's clear, neck supple PULM: Wheezing L>R, poor air movement, normal percussion CV: RRR, no mgr, trace edema GI: BS+, soft, nontender Derm: thin skin, no bruising Psyche: normal mood and affect    BMET    Component Value Date/Time   NA 136 06/05/2016 0933   K 4.2 06/05/2016 0933   CL 99 (L) 06/05/2016 0933   CO2 28 06/05/2016 0933   GLUCOSE 248 (H) 06/05/2016 3646  BUN 18 06/05/2016 0933   CREATININE 1.27 (H) 06/05/2016 0933   CREATININE 0.85 02/12/2015 0949   CALCIUM 9.0 06/05/2016 0933   GFRNONAA 41 (L) 06/05/2016 0933   GFRAA 48 (L) 06/05/2016 0933   Microbiology  BAL February 2018 Pseudomonas  PFT: PFT 2011:  FEV1 1.34 (55%), ratio 68, ++airtrapping on lung volumes, nl TLC, DLCO 40% pred. PFT's  03/23/2015  FEV1 1.61 (68 % ) ratio 69  p no % improvement from saba on   Symb/spiriva January 2018 pulmonary function testing ratio 70%, FEV1 1.63 L 70% predicted, FVC 2.33 L 76% predicted, total lung capacity 4.48 L 86% predicted, DLCO 5. 09/06/2019 percent predicted  Heart Cath: LHC 12/2011: non-obstructive cad RHC 12/2011:  PA 45/29 with mean 75m, PCWP 22, CO Fick 10.25, PVR 1.17 wood units, slight step up in saturation from   RV to PA .  Imaging: CT chest  12/2011:  No PE, very mild mosaic perfusion abnl (prob secondary to her known airflow obstruction).  January 2018 high-resolution CT scan of the chest shows air trapping, irregular distribution upper lobe fibrotic changes (interlobular septal thickening, some patchy ground glass) in a patchy distribution not consistent with usual interstitial pneumonitis  Cardiac imaging: Myoview 2012: no ischemia Echo 2012: nl LV and EF, impaired relaxation, nothing to suggest pulm htn. Echo 79/41/7408  Nl LV, diastolic dysfxn, normal RV, estimated RVSP 46 TEE 03/2012:  A few late bubbles seen felt c/w small IP shunting.  Cardiac MRI 2013:  No infiltrative process.   07/04/2015 RHC: RA mean 9 RV 45/10 PA 41/20, mean 30 PCWP mean 13  Oxygen saturations: SVC 68% RA 65% RV 61% PA 69% LA 98% Peripheral sat 93%  Cardiac Output (Fick) 6.7  Cardiac Index (Fick) 3.3 PVR 2.5 WU Qp/Qs = 0.97 06/2016 HRCT>   Upper lobe predominant fibrotic change and with centrilobular emphysema, no honeycombing     Assessment & Plan:    Pulmonary hypertension ([place] Her pulmonary hypertension is due to her chronic lung disease. We will continue treating with oxygen and focusing treatment on her lung disease. There is no role for a pulmonary vasodilator.  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 We will plan on repeating a lung function test in July 2018.  Chronic respiratory failure (HCC) Severe hypoxemia due to chronic bronchitis from COPD and underlying ILD and possibly intrapulmonary shunt. Continue to titrate O2 as needed to maintain O2 sat ration greater than 88%  CHRONIC DYSPNEA Complicated by profound deconditioning. Encouraged to start exercising for  Centrilobular emphysema ([place] Her symptoms are consistent with gold grade D disease. Her bronchoscopy from February 2018 showed that she is culture positive for Pseudomonas which  is often associated with more severe disease and more severe symptoms. A bronchoscopy was also noted for profound mucopurulent bronchitis. This likely contributes to her symptoms of wheezing, dyspnea, and hypoxemia.  After lengthy discussion, we've decided to treat her with daily azithromycin. This is been associated with improved outcomes in patients who have recurrent exacerbations of COPD. While not often considered a Pseudomonas treatment, the cystic fibrosis population we know that patients who have Pseudomonas colonization tend to do better with daily azithromycin treatment because it is thought to breakdown the biofilm secreted by that bacteria.  Plan: Start azithromycin 250 mg daily indefinitely Counseled today at length to stop immediately if she feels a sensation of palpitations or hearing change Follow-up 2 weeks with  an EKG and liver function test to make sure there is no evidence of toxicity Continue Symbicort and Spiriva Exercise regularly  Greater 50% of this 20 minute visit spent face-to-face    Current Outpatient Prescriptions:  .  acetaminophen (TYLENOL) 500 MG tablet, Take 1,000 mg by mouth every 6 (six) hours as needed for mild pain or headache. , Disp: , Rfl:  .  albuterol (PROVENTIL) (2.5 MG/3ML) 0.083% nebulizer solution, Take 3 mLs (2.5 mg total) by nebulization every 6 (six) hours as needed for wheezing or shortness of breath., Disp: 75 mL, Rfl: 3 .  ALPRAZolam (XANAX) 0.25 MG tablet, Take 0.125 mg by mouth at bedtime as needed for anxiety., Disp: , Rfl:  .  atorvastatin (LIPITOR) 40 MG tablet, Take 40 mg by mouth at bedtime. , Disp: , Rfl:  .  benzonatate (TESSALON) 200 MG capsule, Take 200 mg by mouth at bedtime. , Disp: , Rfl:  .  buPROPion (WELLBUTRIN XL) 300 MG 24 hr tablet, Take 300 mg by mouth daily., Disp: , Rfl:  .  DULoxetine (CYMBALTA) 60 MG capsule, Take 60 mg by mouth daily. , Disp: , Rfl:  .  furosemide (LASIX) 40 MG tablet, Take 80 mg (2 Tabs) in the AM,  take 40 mg (1 Tab) in the PM., Disp: 90 tablet, Rfl: 3 .  gabapentin (NEURONTIN) 600 MG tablet, Take 600 mg by mouth 3 (three) times daily. , Disp: , Rfl:  .  Guaifenesin (MUCINEX MAXIMUM STRENGTH) 1200 MG TB12, Take 1,200 mg by mouth 2 (two) times daily as needed (cough / congestion). , Disp: , Rfl:  .  HYDROcodone-acetaminophen (NORCO/VICODIN) 5-325 MG per tablet, Take 1 tablet by mouth every 4 (four) hours as needed for moderate pain. Reported on 12/12/2015, Disp: , Rfl:  .  insulin glargine (LANTUS) 100 UNIT/ML injection, Inject 0.66 mLs (66 Units total) into the skin daily before breakfast. (Patient taking differently: Inject 64 Units into the skin daily before breakfast. ), Disp: , Rfl:  .  insulin lispro (HUMALOG) 100 UNIT/ML injection, Inject 6 Units into the skin See admin instructions. Inject 6 units subcutaneously twice daily (with breakfast and supper) if CBG >150, Disp: , Rfl:  .  irbesartan (AVAPRO) 150 MG tablet, Take 1 tablet (150 mg total) by mouth daily., Disp: 45 tablet, Rfl: 3 .  iron polysaccharides (NIFEREX) 150 MG capsule, Take 1 capsule (150 mg total) by mouth 2 (two) times daily., Disp: 60 capsule, Rfl: 0 .  levalbuterol (XOPENEX HFA) 45 MCG/ACT inhaler, Inhale 1-2 puffs into the lungs every 4 (four) hours as needed for wheezing. (Patient taking differently: Inhale 2 puffs into the lungs every 4 (four) hours as needed for wheezing or shortness of breath. ), Disp: 1 Inhaler, Rfl: 12 .  Multiple Vitamin (MULTIVITAMIN WITH MINERALS) TABS tablet, Take 1 tablet by mouth daily., Disp: , Rfl:  .  omeprazole (PRILOSEC) 20 MG capsule, Take 20 mg by mouth daily after supper. , Disp: , Rfl:  .  ONE TOUCH ULTRA TEST test strip, 1 each by Other route 3 (three) times daily. , Disp: , Rfl: 4 .  OXYGEN, Inhale 5 L into the lungs continuous. , Disp: , Rfl:  .  polyethylene glycol (MIRALAX / GLYCOLAX) packet, Take 17 g by mouth daily. Mix in 8 oz liquid and drink, Disp: , Rfl:  .  polyvinyl  alcohol (ARTIFICIAL TEARS) 1.4 % ophthalmic solution, Place 1 drop into both eyes 2 (two) times daily. , Disp: , Rfl:  .  potassium chloride (K-DUR,KLOR-CON) 10 MEQ tablet, Take 3 tablets (30 mEq total) by mouth 2 (two) times daily., Disp: 180 tablet, Rfl: 3 .  rivaroxaban (XARELTO) 20 MG TABS tablet, Take 1 tablet (20 mg total) by mouth daily with supper. Restart on Wednesday May 11 , 2016 (Patient taking differently: Take 20 mg by mouth daily with supper. ), Disp: 30 tablet, Rfl:  .  roflumilast (DALIRESP) 500 MCG TABS tablet, Take 500 mcg by mouth daily., Disp: , Rfl:  .  SYMBICORT 160-4.5 MCG/ACT inhaler, INHALE 2 PUFFS BY MOUTH TWICE DAILY, Disp: 10.2 g, Rfl: 5 .  tiotropium (SPIRIVA) 18 MCG inhalation capsule, Place 1 capsule (18 mcg total) into inhaler and inhale every morning. (Patient taking differently: Place 18 mcg into inhaler and inhale daily. ), Disp: 30 capsule, Rfl: 11 .  verapamil (VERELAN PM) 360 MG 24 hr capsule, Take 1 capsule (360 mg total) by mouth daily. KEEP OV., Disp: 90 capsule, Rfl: 0 .  azithromycin (ZITHROMAX) 250 MG tablet, Take 1 tablet (250 mg total) by mouth daily., Disp: 30 tablet, Rfl: 2

## 2016-09-08 NOTE — Assessment & Plan Note (Addendum)
Severe hypoxemia due to chronic bronchitis from COPD and underlying ILD and possibly intrapulmonary shunt. Continue to titrate O2 as needed to maintain O2 sat ration greater than 88%

## 2016-09-09 ENCOUNTER — Other Ambulatory Visit: Payer: Self-pay | Admitting: Cardiology

## 2016-09-09 NOTE — Telephone Encounter (Signed)
REFILL 

## 2016-09-22 ENCOUNTER — Ambulatory Visit (INDEPENDENT_AMBULATORY_CARE_PROVIDER_SITE_OTHER): Payer: Medicare Other | Admitting: Adult Health

## 2016-09-22 ENCOUNTER — Encounter: Payer: Self-pay | Admitting: Adult Health

## 2016-09-22 ENCOUNTER — Other Ambulatory Visit (INDEPENDENT_AMBULATORY_CARE_PROVIDER_SITE_OTHER): Payer: Medicare Other

## 2016-09-22 VITALS — BP 110/64 | HR 102 | Ht 65.0 in | Wt 218.4 lb

## 2016-09-22 DIAGNOSIS — J9611 Chronic respiratory failure with hypoxia: Secondary | ICD-10-CM | POA: Diagnosis not present

## 2016-09-22 DIAGNOSIS — J849 Interstitial pulmonary disease, unspecified: Secondary | ICD-10-CM | POA: Diagnosis not present

## 2016-09-22 DIAGNOSIS — J441 Chronic obstructive pulmonary disease with (acute) exacerbation: Secondary | ICD-10-CM

## 2016-09-22 DIAGNOSIS — J432 Centrilobular emphysema: Secondary | ICD-10-CM

## 2016-09-22 LAB — HEPATIC FUNCTION PANEL
ALK PHOS: 67 U/L (ref 39–117)
ALT: 12 U/L (ref 0–35)
AST: 18 U/L (ref 0–37)
Albumin: 4.1 g/dL (ref 3.5–5.2)
BILIRUBIN DIRECT: 0.1 mg/dL (ref 0.0–0.3)
TOTAL PROTEIN: 7.1 g/dL (ref 6.0–8.3)
Total Bilirubin: 0.4 mg/dL (ref 0.2–1.2)

## 2016-09-22 NOTE — Assessment & Plan Note (Signed)
Frequent exacerbations , recently started on daily Azithromycin  EKG stable w/ nml QT interval.  Check LFT  Cont on current regimen  follow up in 6 weeks.

## 2016-09-22 NOTE — Assessment & Plan Note (Signed)
Cont on O2 . , keep o2 sats >90%.

## 2016-09-22 NOTE — Patient Instructions (Signed)
Continue on current regimen .  Labs today .  follow up Dr. Lake Bells in 6 weeks and As needed

## 2016-09-22 NOTE — Progress Notes (Signed)
_0  ID: Destiny Shelton, female    DOB: 10/09/1945, 71 y.o.   MRN: [id number]  Chief Complaint  Patient presents with  . Follow-up    COPD    Referring provider: Marton Redwood, MD  HPI: 71 year old female former smoker followed for COPD, pulmonary hypertension and ILD  TEST  06/2016 HRCT Chest >Fibrotic interstitial lung disease characterized by patchy peribronchovascular and subpleural reticulation, mild traction bronchiectasis, mild architectural distortion, mosaic attenuation with air trapping and scattered predominantly centrilobular nodularity. No frank honeycombing. No clear basilar gradient. Findings have progressed compared to 2020-01-112 chest CT, with minimal interval progression since 01/22/2016. Findings are favored represent chronic hypersensitivity pneumonitis. Findings are not consistent with usual interstitial pneumonia (UIP). 3. Mild centrilobular and paraseptal emphysema with mild diffuse bronchial wall thickening, suggesting COPD . pfts 2011: FEV1 1.34 (55%), ratio 68, ++airtrapping on lung volumes, nl TLC, DLCO 40% pred. PFT's 03/23/2015 FEV1 1.61 (68 % ) ratio 69 p no % improvement from saba on Symb/spiriva  Echo 2012: nl LV and EF, impaired relaxation, nothing to suggest pulm htn. Myoview 2012: no ischemia LHC 12/2011: non-obstructive cad RHC 12/2011: PA 45/29 with mean 18m, PCWP 22, CO Fick 10.25, PVR 1.17 wood units, slight step up in saturation from RV to [place]/2013: Equal 4 chamber pressures, but no square root sign.  Echo 74/17/4081 Nl LV, diastolic dysfxn, normal RV, estimated RVSP 46.  TEE 03/2012: A few late bubbles seen felt c/w small IP shunting.  CT chest 12/2011: No PE, very mild mosaic perfusion abnl (prob secondary to her known airflow obstruction).  Cardiac MRI 2013: No infiltrative process.  TCD bubble study 03/2012: + for medium intracardiac right to left shunt.  07/04/2015 RHC: RA mean 9 RV  45/10 PA 41/20, mean 30 PCWP mean 13 Oxygen saturations: SVC 68% RA 65% RV 61% PA 69% LA 98% Peripheral sat 93% Cardiac Output (Fick) 6.7 Cardiac Index (Fick) 3.3 PVR 2.5 WU Qp/Qs = 0.97 06/2016 HRCT>Upper lobe predominant fibrotic change and with centrilobular emphysema, no honeycombing January 2018 pulmonary function testing ratio 70%, FEV1 1.63 L 70% predicted, FVC 2.33 L 76% predicted, total lung capacity 4.48 L 86% predicted, DLCO 5. 09/06/2019 percent predicted BAL 07/2016 >+few psuedomonas-pan sens    09/22/2016 Follow up : COPD/ILD/O2 RF /pulm HTN  Patient presents for a two-week follow-up. Last visit, daily. Azithromycin was added to her regimen. Patient says she is tolerating she denies any nausea, vomiting, diarrhea or appetite changes.. He has not noticed any change to her breathing. She continues to have shortness of breath with minimal activity. She remains on 5 L of oxygen at rest and 6 L of oxygen with activity.. CT chest January 2018 show progressive ILD changes. Pt underwent a bronchoscopy February 2018 that showed a few Pseudomonas on culture. She was treated with Levaquin for 7 days..  EKG done today shows ST w/ no acute changes and nml QT interval.    Allergies  Allergen Reactions  . Aspirin Shortness Of Breath  . Nsaids Shortness Of Breath    Immunization History  Administered Date(s) Administered  . Influenza Split 02/17/2011, 02/19/2012, 02/24/2015  . Influenza, High Dose Seasonal PF 03/10/2016  . Influenza,inj,Quad PF,36+ Mos 03/08/2013  . Influenza-Unspecified 01/31/2014  . Pneumococcal Conjugate-13 08/18/2008    Past Medical History:  Diagnosis Date  . Adenomatous colon polyp   . ALLERGIC RHINITIS   . Anemia    iron deficient  . Anxiety   . Atrial tachycardia ([place]  Mostly Sinus Tachycardia  . Back pain, chronic   . Carotid stenosis   . CHF (congestive heart failure) ([employer])   . Community acquired pneumonia - Recent Admission (07/2013) 07/08/2013    . COPD (chronic obstructive pulmonary disease) ([place])    Requiring Home O2 at 4L  . Depression   . Diabetes mellitus type 2 with neurological manifestations ([place])    On Insulin  . Diverticulosis of colon   . External hemorrhoids   . Fibromyalgia    "pain in arms and shoulders."  . Gastritis   . GERD (gastroesophageal reflux disease)   . Headache(784.0)    tension  . Hyperlipemia   . Hypertension   . Inappropriate sinus tachycardia   . Morbidly obese ([place])   . Nephrolithiasis   . Neuromuscular disorder ([place])    diabetic neuropathy  . Osteoarthritis   . Osteoporosis   . PAF (paroxysmal atrial fibrillation) ([place])   . 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  . Sleep apnea    no cpap machine  02 at 4l/min all the time  . Urosepsis 05/26/2012    Tobacco History: History  Smoking Status  . Former Smoker  . Packs/day: 2.00  . Years: 30.00  . Types: Cigarettes  . Quit date: 06/02/1994  Smokeless Tobacco  . Never Used   Counseling given: Not Answered   Outpatient Encounter Prescriptions as of 09/22/2016  Medication Sig  . acetaminophen (TYLENOL) 500 MG tablet Take 1,000 mg by mouth every 6 (six) hours as needed for mild pain or headache.   . albuterol (PROVENTIL) (2.5 MG/3ML) 0.083% nebulizer solution Take 3 mLs (2.5 mg total) by nebulization every 6 (six) hours as needed for wheezing or shortness of breath.  . ALPRAZolam (XANAX) 0.25 MG tablet Take 0.125 mg by mouth at bedtime as needed for anxiety.  Marland Kitchen atorvastatin (LIPITOR) 40 MG tablet Take 40 mg by mouth at bedtime.   Marland Kitchen azithromycin (ZITHROMAX) 250 MG tablet Take 1 tablet (250 mg total) by mouth daily.  . benzonatate (TESSALON) 200 MG capsule Take 200 mg by mouth at bedtime.   Marland Kitchen buPROPion (WELLBUTRIN XL) 300 MG 24 hr tablet Take 300 mg by mouth daily.  . DULoxetine (CYMBALTA) 60 MG capsule Take 60 mg by mouth daily.   . furosemide (LASIX) 40 MG tablet Take 80 mg (2 Tabs) in the AM, take 40 mg (1 Tab) in the PM.  . gabapentin  (NEURONTIN) 600 MG tablet Take 600 mg by mouth 3 (three) times daily.   . Guaifenesin (MUCINEX MAXIMUM STRENGTH) 1200 MG TB12 Take 1,200 mg by mouth 2 (two) times daily as needed (cough / congestion).   Marland Kitchen HYDROcodone-acetaminophen (NORCO/VICODIN) 5-325 MG per tablet Take 1 tablet by mouth every 4 (four) hours as needed for moderate pain. Reported on 12/12/2015  . insulin glargine (LANTUS) 100 UNIT/ML injection Inject 0.66 mLs (66 Units total) into the skin daily before breakfast. (Patient taking differently: Inject 64 Units into the skin daily before breakfast. )  . insulin lispro (HUMALOG) 100 UNIT/ML injection Inject 6 Units into the skin See admin instructions. Inject 6 units subcutaneously twice daily (with breakfast and supper) if CBG >150  . irbesartan (AVAPRO) 150 MG tablet Take 1 tablet (150 mg total) by mouth daily.  . iron polysaccharides (NIFEREX) 150 MG capsule Take 1 capsule (150 mg total) by mouth 2 (two) times daily.  Marland Kitchen levalbuterol (XOPENEX HFA) 45 MCG/ACT inhaler Inhale 1-2 puffs into the lungs every 4 (four) hours as needed for wheezing. (Patient taking differently:  Inhale 2 puffs into the lungs every 4 (four) hours as needed for wheezing or shortness of breath. )  . Multiple Vitamin (MULTIVITAMIN WITH MINERALS) TABS tablet Take 1 tablet by mouth daily.  Marland Kitchen omeprazole (PRILOSEC) 20 MG capsule Take 20 mg by mouth daily after supper.   . ONE TOUCH ULTRA TEST test strip 1 each by Other route 3 (three) times daily.   . OXYGEN Inhale 5 L into the lungs continuous.   . polyethylene glycol (MIRALAX / GLYCOLAX) packet Take 17 g by mouth daily. Mix in 8 oz liquid and drink  . polyvinyl alcohol (ARTIFICIAL TEARS) 1.4 % ophthalmic solution Place 1 drop into both eyes 2 (two) times daily.   . potassium chloride (K-DUR,KLOR-CON) 10 MEQ tablet Take 3 tablets (30 mEq total) by mouth 2 (two) times daily.  . rivaroxaban (XARELTO) 20 MG TABS tablet Take 1 tablet (20 mg total) by mouth daily with supper.  Restart on Wednesday May 11 , 2016 (Patient taking differently: Take 20 mg by mouth daily with supper. )  . roflumilast (DALIRESP) 500 MCG TABS tablet Take 500 mcg by mouth daily.  . SYMBICORT 160-4.5 MCG/ACT inhaler INHALE 2 PUFFS BY MOUTH TWICE DAILY  . tiotropium (SPIRIVA) 18 MCG inhalation capsule Place 1 capsule (18 mcg total) into inhaler and inhale every morning. (Patient taking differently: Place 18 mcg into inhaler and inhale daily. )  . verapamil (VERELAN PM) 360 MG 24 hr capsule TAKE ONE CAPSULE BY MOUTH DAILY   No facility-administered encounter medications on file as of 09/22/2016.      Review of Systems  Constitutional:   No  weight loss, night sweats,  Fevers, chills, +fatigue, or  lassitude.  HEENT:   No headaches,  Difficulty swallowing,  Tooth/dental problems, or  Sore throat,                No sneezing, itching, ear ache, nasal congestion, post nasal drip,   CV:  No chest pain,  Orthopnea, PND, swelling in lower extremities, anasarca, dizziness, palpitations, syncope.   GI  No heartburn, indigestion, abdominal pain, nausea, vomiting, diarrhea, change in bowel habits, loss of appetite, bloody stools.   Resp:    No chest wall deformity  Skin: no rash or lesions.  GU: no dysuria, change in color of urine, no urgency or frequency.  No flank pain, no hematuria   MS:  No joint pain or swelling.  No decreased range of motion.  No back pain.    Physical Exam  BP 110/64 (BP Location: Right Arm, Cuff Size: Normal)   Pulse (!) 102   Ht _0  (1.651 m)   Wt 218 lb 6.4 oz (99.1 kg)   SpO2 92%   BMI 36.34 kg/m   GEN: A/Ox3; pleasant , NAD, elderly on o2    HEENT:  [place]/AT,  EACs-clear, TMs-wnl, NOSE-clear, THROAT-clear, no lesions, no postnasal drip or exudate noted.   NECK:  Supple w/ fair ROM; no JVD; normal carotid impulses w/o bruits; no thyromegaly or nodules palpated; no lymphadenopathy.    RESP  BB crackles , no  accessory muscle use, no dullness to  percussion  CARD:  RRR, no m/r/g, no peripheral edema, pulses intact, no cyanosis or clubbing.  GI:   Soft & nt; nml bowel sounds; no organomegaly or masses detected.   Musco: Warm bil, no deformities or joint swelling noted. Rolling walker with seat  Neuro: alert, no focal deficits noted.    Skin: Warm, no lesions or rashes  Lab Results:   BNP  Imaging: No results found.   Assessment & Plan:   Chronic respiratory failure (HCC) Cont on O2 . , keep o2 sats >90%.    ILD (interstitial lung disease) ([place]) Cont on current regimen .  O2 to keep sats >90%.    COPD exacerbation (HCC) Frequent exacerbations , recently started on daily Azithromycin  EKG stable w/ nml QT interval.  Check LFT  Cont on current regimen  follow up in 6 weeks.       Rexene Edison, NP 09/22/2016

## 2016-09-22 NOTE — Assessment & Plan Note (Signed)
Cont on current regimen .  O2 to keep sats >90%.

## 2016-09-29 ENCOUNTER — Ambulatory Visit (HOSPITAL_COMMUNITY)
Admission: RE | Admit: 2016-09-29 | Discharge: 2016-09-29 | Disposition: A | Discharge status: Home / Self Care | Payer: Medicare Other | Source: Ambulatory Visit | Attending: Cardiology | Admitting: Cardiology

## 2016-09-29 ENCOUNTER — Encounter (HOSPITAL_COMMUNITY): Payer: Self-pay

## 2016-09-29 VITALS — BP 117/53 | HR 111 | Wt 219.5 lb

## 2016-09-29 DIAGNOSIS — Z808 Family history of malignant neoplasm of other organs or systems: Secondary | ICD-10-CM | POA: Diagnosis not present

## 2016-09-29 DIAGNOSIS — J449 Chronic obstructive pulmonary disease, unspecified: Secondary | ICD-10-CM | POA: Insufficient documentation

## 2016-09-29 DIAGNOSIS — I272 Pulmonary hypertension, unspecified: Secondary | ICD-10-CM | POA: Insufficient documentation

## 2016-09-29 DIAGNOSIS — Z823 Family history of stroke: Secondary | ICD-10-CM | POA: Insufficient documentation

## 2016-09-29 DIAGNOSIS — I480 Paroxysmal atrial fibrillation: Secondary | ICD-10-CM | POA: Diagnosis not present

## 2016-09-29 DIAGNOSIS — Z8371 Family history of colonic polyps: Secondary | ICD-10-CM | POA: Diagnosis not present

## 2016-09-29 DIAGNOSIS — Z7902 Long term (current) use of antithrombotics/antiplatelets: Secondary | ICD-10-CM | POA: Diagnosis not present

## 2016-09-29 DIAGNOSIS — Z6836 Body mass index (BMI) 36.0-36.9, adult: Secondary | ICD-10-CM | POA: Diagnosis not present

## 2016-09-29 DIAGNOSIS — I5032 Chronic diastolic (congestive) heart failure: Secondary | ICD-10-CM | POA: Diagnosis not present

## 2016-09-29 DIAGNOSIS — E669 Obesity, unspecified: Secondary | ICD-10-CM | POA: Insufficient documentation

## 2016-09-29 DIAGNOSIS — I110 Hypertensive heart disease with heart failure: Secondary | ICD-10-CM | POA: Insufficient documentation

## 2016-09-29 DIAGNOSIS — E119 Type 2 diabetes mellitus without complications: Secondary | ICD-10-CM | POA: Diagnosis not present

## 2016-09-29 DIAGNOSIS — Z87891 Personal history of nicotine dependence: Secondary | ICD-10-CM | POA: Diagnosis not present

## 2016-09-29 DIAGNOSIS — G4733 Obstructive sleep apnea (adult) (pediatric): Secondary | ICD-10-CM | POA: Diagnosis not present

## 2016-09-29 DIAGNOSIS — Z833 Family history of diabetes mellitus: Secondary | ICD-10-CM | POA: Insufficient documentation

## 2016-09-29 DIAGNOSIS — Z9981 Dependence on supplemental oxygen: Secondary | ICD-10-CM | POA: Insufficient documentation

## 2016-09-29 DIAGNOSIS — Z794 Long term (current) use of insulin: Secondary | ICD-10-CM | POA: Diagnosis not present

## 2016-09-29 DIAGNOSIS — J849 Interstitial pulmonary disease, unspecified: Secondary | ICD-10-CM | POA: Diagnosis not present

## 2016-09-29 DIAGNOSIS — Z8249 Family history of ischemic heart disease and other diseases of the circulatory system: Secondary | ICD-10-CM | POA: Diagnosis not present

## 2016-09-29 LAB — BASIC METABOLIC PANEL
ANION GAP: 8 (ref 5–15)
BUN: 14 mg/dL (ref 6–20)
CHLORIDE: 101 mmol/L (ref 101–111)
CO2: 29 mmol/L (ref 22–32)
Calcium: 9.2 mg/dL (ref 8.9–10.3)
Creatinine, Ser: 0.89 mg/dL (ref 0.44–1.00)
GFR calc non Af Amer: >60 mL/min (ref >60)
GLUCOSE: 156 mg/dL — AB (ref 65–99)
POTASSIUM: 4.2 mmol/L (ref 3.5–5.1)
Sodium: 138 mmol/L (ref 135–145)

## 2016-09-29 LAB — CBC
HEMATOCRIT: 38.9 % (ref 36.0–46.0)
HEMOGLOBIN: 11.8 g/dL — AB (ref 12.0–15.0)
MCH: 27.1 pg (ref 26.0–34.0)
MCHC: 30.3 g/dL (ref 30.0–36.0)
MCV: 89.4 fL (ref 78.0–100.0)
Platelets: 215 10*3/uL (ref 150–400)
RBC: 4.35 MIL/uL (ref 3.87–5.11)
RDW: 14.6 % (ref 11.5–15.5)
WBC: 10.2 10*3/uL (ref 4.0–10.5)

## 2016-09-29 LAB — BRAIN NATRIURETIC PEPTIDE: B NATRIURETIC PEPTIDE 5: 25.4 pg/mL (ref 0.0–100.0)

## 2016-09-29 MED ORDER — FUROSEMIDE 40 MG PO TABS: 80.0000 mg | ORAL_TABLET | Freq: Two times a day (BID) | ORAL | 3 refills | Status: DC

## 2016-09-29 NOTE — Patient Instructions (Signed)
Labs today (will call for abnormal results, otherwise no news is good news)  INCREASE Lasix to 80 mg (2 Tablets) Two Times Daily  Lab work in 10 days (BMET)  Follow up in 3 months

## 2016-09-29 NOTE — Progress Notes (Signed)
Patient ID: Destiny Shelton, female   DOB: [birth year], 71 y.o.   MRN: [id number]  PCP: Dr. Brigitte Pulse HF Cardiology: Dr. Aundra Dubin  71 yo with history of paroxysmal atrial fibrillation, chronic diastolic CHF with restrictive hemodynamics on 2013 RHC, COPD on home oxygen, inappropriate sinus tachycardia, and concern for intracardiac shunting presents for CHF clinic evaluation.  She has been followed for several years by both cardiology and pulmonology.  She is a prior smoker and has been diagnosed with COPD.  Interestingly, her PFTs in 10/16 showed improvement, suggesting mild obstructive airways disease.  However, she still requires home oxygen.  She had transcranial dopplers in 2013 suggesting a medium-sized right to left shunt.  This led to an extensive workup.  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  Cardiac MRI was unremarkable, no evidence for shunt lesion.  RHC/LHC showed no coronary disease but it did show restrictive hemodynamics and volume overload.  Qp/Qs from this study was 1.27/1, suggesting a relatively small left to right shunt.  A definite shunt lesion was never identified.    Echo in 2/17 showed normal LV size and systolic function and normal RV.  I also did a RHC in 2/17: on higher dose diuretics, she had mild pulmonary hypertension and normal PCWP.  There was no evidence for a shunt lesion with Qp/Qs 0.97.   She was admitted in 9/17 with acute exacerbation of COPD.  She was treated with steroids and nebs.  She was admitted in 12/17 with suspected COPD exacerbation, treated with steroids and levofloxacin.  Lasix was decreased to 80 mg once daily but I increased it back to 80 qam/40 qpm when I saw her in followup.    She remains short of breath with mild exertion.  Oxygen saturation drops into the 70s if she walks more than a very short distance.  She feels like her dyspnea has gradually worsened over time.  Weight is up 4 lbs. No lightheadedness or dyspnea. Only gets chest  pain with coughing.  She has a chronic dry cough.  No BRBPR/melena.   Labs (9/16): digoxin 0.6, K 4.3, creatinine 0.85 Labs (1/17): K 3.9, creatinine 0.96, hgb 8.9 Labs (3/17): K 4.2, creatinine 0.96 Labs (6/17): K 4.2, creatinine 1.03 Labs (8/17): K 4.1, creatinine 0.81, HCT 34.3 Labs (9/17): K 3.7, creatinine 0.91, hgb 12.1 Labs (12/17): K 3.4, creatinine 0.84, hgb 13 Labs (1/18): K 4.2, creatinine 1.27  ECG (4/18, personally reviewed): Sinus tachycardia at 109  PMH: 1. Atrial fibrillation: Paroxysmal.  2. HTN 3. COPD: Home oxygen.  PFTs (10/16) with FVC 77%, FEV1 68%, TLC 89% => mild obstruction (improved from the past).   4. Type II diabetes 5. Obesity 6. Inappropriate sinus tachycardia 7. OSA: Does not use CPAP, uses oxygen at night.  8. Chronic diastolic CHF: RHC/LHC in 8/31 with no significant CAD, mean RA 19, RV 46/18, PA 45/29 mean 37, mean PCWP 22, CI 4.1, PVR 1.4 WU => restrictive hemodynamics with no evidence for constriction;  Qp/Qs 1.27/1; saturation run difficult to interpret.  TEE (10/13): EF 55-60%, LVH, mild MR, bubble study showed no evidence for immediate R=>L bubble crossing, small amount of bubbles ended up in left heart > 5 seconds after injection suggesting possible intrapulmonary shunting; no shunt by color doppler.  Cardiac MRI (8/13) with EF 59%, no LGE, normal RV size/systolic function => interpreted as normal.  RHC 07/04/2015: RA mean 9, RV 45/10, PA 41/20 mean 30, PCWP mean 13, no step up on shunt run,cardiac  output (Fick) 6.7 cardiac index (Fick) 3.3, PVR 2.5 WU, Qp/Qs = 0.97. Echo (2/17) with EF 55-60%, mild LVH, mild MR, normal RV size and systolic function.  9. ?Intracardiac shunt: Transcranial dopplers in 10/13 were suggestive of medium-sized right to left shunt.  Bubble study on TEE in 10/13 showed no evidence for immediate R=>L bubble crossing, small amount of bubbles ended up in left heart > 5 seconds after injection suggesting possible intrapulmonary  shunting; no shunt by color doppler. Cardiac MRI showed no evidence for shunting.  Cardiac cath in 2013 actually was suggestive of a small left to right shunt.  Cardiac cath in 2/17 NOT suggestive of significant shunt, Qp/Qs 0.97.  10. Interstitial lung disease: HRCT in 1/18 was concerning for ILD, most likely hypersensitivity pneumonitis.   SH: Married, prior heavy smoker (quit 1996).  No ETOH.   Family History  Problem Relation Age of Onset  . Heart disease Mother   . Stroke Mother   . Heart disease Father   . Heart disease Brother   . Stroke Brother   . Skin cancer Brother   . Colon polyps Sister   . Diabetes Sister   . Diabetes Brother   . Irritable bowel syndrome Daughter   . Colon cancer Neg Hx    ROS: All systems reviewed and negative except as per HPI.   Current Outpatient Prescriptions  Medication Sig Dispense Refill  . acetaminophen (TYLENOL) 500 MG tablet Take 1,000 mg by mouth every 6 (six) hours as needed for mild pain or headache.     . albuterol (PROVENTIL) (2.5 MG/3ML) 0.083% nebulizer solution Take 3 mLs (2.5 mg total) by nebulization every 6 (six) hours as needed for wheezing or shortness of breath. 75 mL 3  . ALPRAZolam (XANAX) 0.25 MG tablet Take 0.125 mg by mouth at bedtime as needed for anxiety.    Marland Kitchen atorvastatin (LIPITOR) 40 MG tablet Take 40 mg by mouth at bedtime.     Marland Kitchen azithromycin (ZITHROMAX) 250 MG tablet Take 1 tablet (250 mg total) by mouth daily. 30 tablet 2  . benzonatate (TESSALON) 200 MG capsule Take 200 mg by mouth at bedtime.     Marland Kitchen buPROPion (WELLBUTRIN XL) 300 MG 24 hr tablet Take 300 mg by mouth daily.    . DULoxetine (CYMBALTA) 60 MG capsule Take 60 mg by mouth daily.     . furosemide (LASIX) 40 MG tablet Take 2 tablets (80 mg total) by mouth 2 (two) times daily. 120 tablet 3  . gabapentin (NEURONTIN) 600 MG tablet Take 600 mg by mouth 3 (three) times daily.     . Guaifenesin (MUCINEX MAXIMUM STRENGTH) 1200 MG TB12 Take 1,200 mg by mouth 2 (two)  times daily as needed (cough / congestion).     Marland Kitchen HYDROcodone-acetaminophen (NORCO/VICODIN) 5-325 MG per tablet Take 1 tablet by mouth every 4 (four) hours as needed for moderate pain. Reported on 12/12/2015    . insulin glargine (LANTUS) 100 UNIT/ML injection Inject 0.66 mLs (66 Units total) into the skin daily before breakfast. (Patient taking differently: Inject 64 Units into the skin daily before breakfast. )    . insulin lispro (HUMALOG) 100 UNIT/ML injection Inject 6 Units into the skin See admin instructions. Inject 6 units subcutaneously twice daily (with breakfast and supper) if CBG >150    . irbesartan (AVAPRO) 150 MG tablet Take 1 tablet (150 mg total) by mouth daily. 45 tablet 3  . iron polysaccharides (NIFEREX) 150 MG capsule Take 1 capsule (150 mg  total) by mouth 2 (two) times daily. 60 capsule 0  . levalbuterol (XOPENEX HFA) 45 MCG/ACT inhaler Inhale 1-2 puffs into the lungs every 4 (four) hours as needed for wheezing. (Patient taking differently: Inhale 2 puffs into the lungs every 4 (four) hours as needed for wheezing or shortness of breath. ) 1 Inhaler 12  . Multiple Vitamin (MULTIVITAMIN WITH MINERALS) TABS tablet Take 1 tablet by mouth daily.    Marland Kitchen omeprazole (PRILOSEC) 20 MG capsule Take 20 mg by mouth daily after supper.     . ONE TOUCH ULTRA TEST test strip 1 each by Other route 3 (three) times daily.   4  . OXYGEN Inhale 5 L into the lungs continuous.     . polyethylene glycol (MIRALAX / GLYCOLAX) packet Take 17 g by mouth daily. Mix in 8 oz liquid and drink    . polyvinyl alcohol (ARTIFICIAL TEARS) 1.4 % ophthalmic solution Place 1 drop into both eyes 2 (two) times daily.     . potassium chloride (K-DUR,KLOR-CON) 10 MEQ tablet Take 3 tablets (30 mEq total) by mouth 2 (two) times daily. 180 tablet 3  . rivaroxaban (XARELTO) 20 MG TABS tablet Take 1 tablet (20 mg total) by mouth daily with supper. Restart on Wednesday May 11 , 2016 30 tablet   . roflumilast (DALIRESP) 500 MCG  TABS tablet Take 500 mcg by mouth daily.    . SYMBICORT 160-4.5 MCG/ACT inhaler INHALE 2 PUFFS BY MOUTH TWICE DAILY 10.2 g 5  . tiotropium (SPIRIVA) 18 MCG inhalation capsule Place 1 capsule (18 mcg total) into inhaler and inhale every morning. (Patient taking differently: Place 18 mcg into inhaler and inhale daily. ) 30 capsule 11  . verapamil (VERELAN PM) 360 MG 24 hr capsule TAKE ONE CAPSULE BY MOUTH DAILY 90 capsule 0   No current facility-administered medications for this encounter.    BP (!) 117/53   Pulse (!) 111   Wt 219 lb 8 oz (99.6 kg)   SpO2 91% Comment: on 5L of O2  BMI 36.53 kg/m  General: NAD, obese, wearing oxygen.  Neck: JVP difficult, ?8-9 cm.  No thyromegaly or thyroid nodule.  Lungs: Distant breath sounds bilaterally.  Mild dry crackles near bases.  CV: Nondisplaced PMI.  Heart mildly tachy, regular S1/S2, no S3/S4.  No edema.  No carotid bruit.  Normal pedal pulses.  Abdomen: Soft, nontender, no hepatosplenomegaly, no distention.  Skin: Intact without lesions or rashes.  Neurologic: Alert and oriented x 3.  Psych: Normal affect. Extremities: No clubbing or cyanosis.  HEENT: Normal.   Assessment/Plan: 1. COPD: On home oxygen, 5L at rest and 6L with exertion.  PFTs in 10/16 actually showed only mild obstruction (improved).  Breathing has improved in the past with increased diuresis, suggesting that CHF plays a significant role in her symptoms in addition to COPD.   2. Atrial fibrillation: Paroxysmal, CHADSVASC = 4.  HR regular on exam today, and she has not felt atrial fibrillation recently.   - She will continue Xarelto, CBC today.  - She can continue verapamil.     3. Chronic diastolic CHF: NYHA class III symptoms, likely due to combination of CHF and COPD/ILD.  I suspect that her parenchymal lung disease, COPD and ILD, is the major contributor to her symptoms.  However, I would like to keep her lungs as dry as we can, and I suspect she has mild volume overload  today. Weight is up a bit.     -  Increase  Lasix to 80 mg bid.  BMET today and repeat in 10 days.   4. ?Shunt lesion: She had transcranial dopplers in 2013 suggesting a medium-sized right to left shunt.  This led to an extensive workup.  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  Cardiac MRI was unremarkable, no evidence for shunt lesion.  RHC/LHC in 2013 showed no coronary disease but it did show restrictive hemodynamics and volume overload.  Qp/Qs from this study was 1.27/1, suggesting a relatively small left to right shunt.  A definite shunt lesion was never identified.  Based on prior workup, it seems most likely that the right to left shunting by transcranial dopplers and TEE bubble study was due to pulmonary AVMs.  RHC repeated in 2/17 did not show a significant shunt lesion, with Qp/Qs 0.97.  5. Inappropriate sinus tachycardia: Had discussed using Corlanor in the past but interacts with verapamil.  6. Pulmonary hypertension: Mild pulmonary hypertension on RHC with PVR only 2.5 WU.  Probably due to COPD/ILD (group 3) and elevated left atrial pressure (group 2).  No specific treatment other than diuresis.  7. Interstitial lung disease: Possible hypersensitivity pneumonitis.  Followed by Dr. Lake Bells.  Dyspnea gradually worsening over time.   Followup in 3 months.   Loralie Champagne 09/29/2016

## 2016-10-06 NOTE — Progress Notes (Signed)
Reviewed, agree 

## 2016-10-09 ENCOUNTER — Ambulatory Visit (HOSPITAL_COMMUNITY)
Admission: RE | Admit: 2016-10-09 | Discharge: 2016-10-09 | Disposition: A | Discharge status: Home / Self Care | Payer: Medicare Other | Source: Ambulatory Visit | Attending: Internal Medicine | Admitting: Internal Medicine

## 2016-10-09 DIAGNOSIS — I5032 Chronic diastolic (congestive) heart failure: Secondary | ICD-10-CM | POA: Insufficient documentation

## 2016-10-09 LAB — BASIC METABOLIC PANEL
ANION GAP: 9 (ref 5–15)
BUN: 16 mg/dL (ref 6–20)
CHLORIDE: 99 mmol/L — AB (ref 101–111)
CO2: 30 mmol/L (ref 22–32)
Calcium: 9.6 mg/dL (ref 8.9–10.3)
Creatinine, Ser: 1.05 mg/dL — ABNORMAL HIGH (ref 0.44–1.00)
GFR calc non Af Amer: 52 mL/min — ABNORMAL LOW (ref >60)
Glucose, Bld: 144 mg/dL — ABNORMAL HIGH (ref 65–99)
Potassium: 4.3 mmol/L (ref 3.5–5.1)
Sodium: 138 mmol/L (ref 135–145)

## 2016-11-02 ENCOUNTER — Other Ambulatory Visit: Payer: Self-pay | Admitting: Pulmonary Disease

## 2016-11-03 DIAGNOSIS — J849 Interstitial pulmonary disease, unspecified: Secondary | ICD-10-CM | POA: Diagnosis not present

## 2016-11-03 DIAGNOSIS — F329 Major depressive disorder, single episode, unspecified: Secondary | ICD-10-CM | POA: Diagnosis not present

## 2016-11-03 DIAGNOSIS — Z6835 Body mass index (BMI) 35.0-35.9, adult: Secondary | ICD-10-CM | POA: Diagnosis not present

## 2016-11-03 DIAGNOSIS — J449 Chronic obstructive pulmonary disease, unspecified: Secondary | ICD-10-CM | POA: Diagnosis not present

## 2016-11-05 ENCOUNTER — Other Ambulatory Visit (INDEPENDENT_AMBULATORY_CARE_PROVIDER_SITE_OTHER): Payer: Medicare Other

## 2016-11-05 ENCOUNTER — Encounter: Payer: Self-pay | Admitting: Pulmonary Disease

## 2016-11-05 ENCOUNTER — Telehealth: Payer: Self-pay | Admitting: Pulmonary Disease

## 2016-11-05 ENCOUNTER — Ambulatory Visit (INDEPENDENT_AMBULATORY_CARE_PROVIDER_SITE_OTHER): Payer: Medicare Other | Admitting: Pulmonary Disease

## 2016-11-05 ENCOUNTER — Ambulatory Visit (INDEPENDENT_AMBULATORY_CARE_PROVIDER_SITE_OTHER)
Admission: RE | Admit: 2016-11-05 | Discharge: 2016-11-05 | Disposition: A | Discharge status: Home / Self Care | Payer: Medicare Other | Source: Ambulatory Visit | Attending: Pulmonary Disease | Admitting: Pulmonary Disease

## 2016-11-05 VITALS — BP 128/64 | HR 110 | Ht 65.0 in | Wt 218.2 lb

## 2016-11-05 DIAGNOSIS — R0602 Shortness of breath: Secondary | ICD-10-CM | POA: Diagnosis not present

## 2016-11-05 DIAGNOSIS — Z5181 Encounter for therapeutic drug level monitoring: Secondary | ICD-10-CM

## 2016-11-05 DIAGNOSIS — J432 Centrilobular emphysema: Secondary | ICD-10-CM

## 2016-11-05 LAB — HEPATIC FUNCTION PANEL
ALT: 14 U/L (ref 0–35)
AST: 22 U/L (ref 0–37)
Albumin: 4.1 g/dL (ref 3.5–5.2)
Alkaline Phosphatase: 62 U/L (ref 39–117)
BILIRUBIN DIRECT: 0.1 mg/dL (ref 0.0–0.3)
TOTAL PROTEIN: 7.2 g/dL (ref 6.0–8.3)
Total Bilirubin: 0.4 mg/dL (ref 0.2–1.2)

## 2016-11-05 MED ORDER — PREDNISONE 10 MG PO TABS: ORAL_TABLET | ORAL | 0 refills | Status: DC

## 2016-11-05 MED ORDER — LEVOFLOXACIN 750 MG PO TABS: 750.0000 mg | ORAL_TABLET | Freq: Every day | ORAL | 0 refills | Status: DC

## 2016-11-05 NOTE — Assessment & Plan Note (Signed)
Destiny Shelton is having an exacerbation of her severe COPD with increasing wheezing, shortness of breath, and cough.  She is colonized with Pseudomonas.  Prior to this exacerbation of her symptoms had been better controlled with azithromycin.  Plan: Levaquin 7 days Chest x-ray Prednisone taper Check liver function test to monitor for therapeutic drug toxicity from azithromycin Follow-up 2 weeks with our nurse practitioner

## 2016-11-05 NOTE — Patient Instructions (Signed)
We will call you with the results of the chest x-ray and blood work Take the prednisone as prescribed Take the Levaquin for 7 days, when you are taking this medicine do not take azithromycin After finishing Levaquin resume azithromycin If you get worse go the emergency room. We will see you back in 2 weeks with a nurse practitioner or sooner if needed

## 2016-11-05 NOTE — Telephone Encounter (Signed)
Called [facility] back. Was on hold for over 5 mins. Will call back.

## 2016-11-05 NOTE — Progress Notes (Signed)
Subjective:    Patient ID: Destiny Shelton, female    DOB: 04-27-1946, 71 y.o.   MRN: [id number]  Synopsys: Former patient of Dr. Gwenette Greet with pulmonary hypertension, Afib, and COPD.  She smoked 2 packs per day for 25 years and quit in the 1990's.  She has been on oxygen since 2012.  As of 2017 She has been using 5L O2 at rest and 6 L with exertion.   She does not have a history of PE that she is aware of.    HPI Chief Complaint  Patient presents with  . Follow-up    pt c/o worsening sob with any exertion, chest tightness, prod cough with green mucus.     She says that she is taking the antibiotic and in general she is feeling a better, but she started feeling worse a few days ago.  Destiny Shelton says that she is not as good as she should be.  She says that whenever she walks around she doesn't get enough air.  At times when she breathes through her nose she feels like she isn't getting any air.  She has been having more cough with mucus production.  She says that if she doesn't move she breathes fine.  However any movement makes her dyspneic.  She is bringing up more mucus in the mornings and in the evenings.  She says the dyspnea is severe.  She says that the coughing spells are worse.  No sick contacts.  The heat has made her breathing worse.  She is trying ot exercise.  She has noted some ankle swelling.  She denies chest pain.        Past Medical History:  Diagnosis Date  . Adenomatous colon polyp   . ALLERGIC RHINITIS   . Anemia    iron deficient  . Anxiety   . Atrial tachycardia (HCC)    Mostly Sinus Tachycardia  . Back pain, chronic   . Carotid stenosis   . CHF (congestive heart failure) ([place])   . Community acquired pneumonia - Recent Admission (07/2013) 07/08/2013  . COPD (chronic obstructive pulmonary disease) ([place])    Requiring Home O2 at 4L  . Depression   . Diabetes mellitus type 2 with neurological manifestations ([place])    On Insulin  . Diverticulosis of colon   . External  hemorrhoids   . Fibromyalgia    "pain in arms and shoulders."  . Gastritis   . GERD (gastroesophageal reflux disease)   . Headache(784.0)    tension  . Hyperlipemia   . Hypertension   . Inappropriate sinus tachycardia   . Morbidly obese ([place])   . Nephrolithiasis   . Neuromuscular disorder ([place])    diabetic neuropathy  . Osteoarthritis   . Osteoporosis   . PAF (paroxysmal atrial fibrillation) ([place])   . 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  . Sleep apnea    no cpap machine  02 at 4l/min all the time  . Urosepsis 05/26/2012      Review of Systems  Constitutional: Negative for chills, diaphoresis, fatigue and fever.  HENT: Negative for postnasal drip, rhinorrhea and sinus pressure.   Respiratory: Positive for cough, shortness of breath and wheezing.   Cardiovascular: Positive for leg swelling. Negative for chest pain and palpitations.       Objective:   Physical Exam Vitals:   11/05/16 1404  BP: 128/64  Pulse: (!) 110  SpO2: (!) 89%  Weight: 218 lb 3.2 oz (99 kg)  Height: _0  (1.651 m)  6L [place]   Gen: chronically ill  appearing HENT: OP clear, TM's clear, neck supple PULM: wheezing bases B, normal percussion CV: RRR, no mgr, trace edema GI: BS+, soft, nontender Derm: no cyanosis or rash Psyche: normal mood and affect     BMET    Component Value Date/Time   NA 138 10/09/2016 0954   K 4.3 10/09/2016 0954   CL 99 (L) 10/09/2016 0954   CO2 30 10/09/2016 0954   GLUCOSE 144 (H) 10/09/2016 0954   BUN 16 10/09/2016 0954   CREATININE 1.05 (H) 10/09/2016 0954   CREATININE 0.85 02/12/2015 0949   CALCIUM 9.6 10/09/2016 0954   GFRNONAA 52 (L) 10/09/2016 0954   GFRAA >60 10/09/2016 0954   Microbiology  BAL February 2018 Pseudomonas  PFT: PFT 2011:  FEV1 1.34 (55%), ratio 68, ++airtrapping on lung volumes, nl TLC, DLCO 40% pred. PFT's  03/23/2015  FEV1 1.61 (68 % ) ratio 69  p no % improvement from saba on   Symb/spiriva January 2018 pulmonary function testing ratio  70%, FEV1 1.63 L 70% predicted, FVC 2.33 L 76% predicted, total lung capacity 4.48 L 86% predicted, DLCO 5. 09/06/2019 percent predicted  Heart Cath: LHC 12/2011: non-obstructive cad RHC 12/2011:  PA 45/29 with mean 71m, PCWP 22, CO Fick 10.25, PVR 1.17 wood units, slight step up in saturation from   RV to PA .  Imaging: CT chest 12/2011:  No PE, very mild mosaic perfusion abnl (prob secondary to her known airflow obstruction).  January 2018 high-resolution CT scan of the chest shows air trapping, irregular distribution upper lobe fibrotic changes (interlobular septal thickening, some patchy ground glass) in a patchy distribution not consistent with usual interstitial pneumonitis  Cardiac imaging: Myoview 2012: no ischemia Echo 2012: nl LV and EF, impaired relaxation, nothing to suggest pulm htn. Echo 71/24/5809  Nl LV, diastolic dysfxn, normal RV, estimated RVSP 46 TEE 03/2012:  A few late bubbles seen felt c/w small IP shunting.  Cardiac MRI 2013:  No infiltrative process.   07/04/2015 RHC: RA mean 9 RV 45/10 PA 41/20, mean 30 PCWP mean 13  Oxygen saturations: SVC 68% RA 65% RV 61% PA 69% LA 98% Peripheral sat 93%  Cardiac Output (Fick) 6.7  Cardiac Index (Fick) 3.3 PVR 2.5 WU Qp/Qs = 0.97 06/2016 HRCT>   Upper lobe predominant fibrotic change and with centrilobular emphysema, no honeycombing     Assessment & Plan:    Centrilobular emphysema (HCC) M[place]illais having an exacerbation of her severe COPD with increasing wheezing, shortness of breath, and cough.  She is colonized with Pseudomonas.  Prior to this exacerbation of her symptoms had been better controlled with azithromycin.  Plan: Levaquin 7 days Chest x-ray Prednisone taper Check liver function test to monitor for therapeutic drug toxicity from azithromycin Follow-up 2 weeks with our nurse practitioner    Current Outpatient Prescriptions:  .  acetaminophen (TYLENOL) 500 MG tablet, Take 1,000 mg by mouth every 6 (six) hours  as needed for mild pain or headache. , Disp: , Rfl:  .  albuterol (PROVENTIL) (2.5 MG/3ML) 0.083% nebulizer solution, Take 3 mLs (2.5 mg total) by nebulization every 6 (six) hours as needed for wheezing or shortness of breath., Disp: 75 mL, Rfl: 3 .  ALPRAZolam (XANAX) 0.25 MG tablet, Take 0.125 mg by mouth at bedtime as needed for anxiety., Disp: , Rfl:  .  atorvastatin (LIPITOR) 40 MG tablet, Take 40 mg by mouth at bedtime. , Disp: , Rfl:  .  azithromycin (ZITHROMAX) 250 MG tablet, Take 1 tablet (250 mg total) by mouth daily., Disp: 30 tablet, Rfl: 2 .  benzonatate (TESSALON) 200 MG capsule, Take 200 mg by mouth at bedtime. , Disp: , Rfl:  .  buPROPion (WELLBUTRIN XL) 300 MG 24 hr tablet, Take 300 mg by mouth daily., Disp: , Rfl:  .  DULoxetine (CYMBALTA) 60 MG capsule, Take 60 mg by mouth daily. , Disp: , Rfl:  .  furosemide (LASIX) 40 MG tablet, Take 2 tablets (80 mg total) by mouth 2 (two) times daily., Disp: 120 tablet, Rfl: 3 .  gabapentin (NEURONTIN) 600 MG tablet, Take 600 mg by mouth 3 (three) times daily. , Disp: , Rfl:  .  Guaifenesin (MUCINEX MAXIMUM STRENGTH) 1200 MG TB12, Take 1,200 mg by mouth 2 (two) times daily as needed (cough / congestion). , Disp: , Rfl:  .  HYDROcodone-acetaminophen (NORCO/VICODIN) 5-325 MG per tablet, Take 1 tablet by mouth every 4 (four) hours as needed for moderate pain. Reported on 12/12/2015, Disp: , Rfl:  .  insulin glargine (LANTUS) 100 UNIT/ML injection, Inject 0.66 mLs (66 Units total) into the skin daily before breakfast. (Patient taking differently: Inject 64 Units into the skin daily before breakfast. ), Disp: , Rfl:  .  insulin lispro (HUMALOG) 100 UNIT/ML injection, Inject 6 Units into the skin See admin instructions. Inject 6 units subcutaneously twice daily (with breakfast and supper) if CBG >150, Disp: , Rfl:  .  irbesartan (AVAPRO) 150 MG tablet, Take 1 tablet (150 mg total) by mouth daily., Disp: 45 tablet, Rfl: 3 .  iron polysaccharides  (NIFEREX) 150 MG capsule, Take 1 capsule (150 mg total) by mouth 2 (two) times daily., Disp: 60 capsule, Rfl: 0 .  levalbuterol (XOPENEX HFA) 45 MCG/ACT inhaler, Inhale 1-2 puffs into the lungs every 4 (four) hours as needed for wheezing. (Patient taking differently: Inhale 2 puffs into the lungs every 4 (four) hours as needed for wheezing or shortness of breath. ), Disp: 1 Inhaler, Rfl: 12 .  Multiple Vitamin (MULTIVITAMIN WITH MINERALS) TABS tablet, Take 1 tablet by mouth daily., Disp: , Rfl:  .  omeprazole (PRILOSEC) 20 MG capsule, Take 20 mg by mouth daily after supper. , Disp: , Rfl:  .  ONE TOUCH ULTRA TEST test strip, 1 each by Other route 3 (three) times daily. , Disp: , Rfl: 4 .  OXYGEN, Inhale 5 L into the lungs continuous. , Disp: , Rfl:  .  polyethylene glycol (MIRALAX / GLYCOLAX) packet, Take 17 g by mouth daily. Mix in 8 oz liquid and drink, Disp: , Rfl:  .  polyvinyl alcohol (ARTIFICIAL TEARS) 1.4 % ophthalmic solution, Place 1 drop into both eyes 2 (two) times daily. , Disp: , Rfl:  .  potassium chloride (K-DUR,KLOR-CON) 10 MEQ tablet, Take 3 tablets (30 mEq total) by mouth 2 (two) times daily., Disp: 180 tablet, Rfl: 3 .  rivaroxaban (XARELTO) 20 MG TABS tablet, Take 1 tablet (20 mg total) by mouth daily with supper. Restart on Wednesday May 11 , 2016, Disp: 30 tablet, Rfl:  .  roflumilast (DALIRESP) 500 MCG TABS tablet, Take 500 mcg by mouth daily., Disp: , Rfl:  .  SPIRIVA HANDIHALER 18 MCG inhalation capsule, INHALE THE CONTENTS OF 1 CAPSULE VIA HANDIHALER BY MOUTH EVERY MORNING, Disp: 30 capsule, Rfl: 11 .  SYMBICORT 160-4.5 MCG/ACT inhaler, INHALE 2 PUFFS BY MOUTH TWICE DAILY, Disp: 10.2 g, Rfl: 5 .  verapamil (VERELAN PM) 360 MG 24 hr capsule, TAKE ONE  CAPSULE BY MOUTH DAILY, Disp: 90 capsule, Rfl: 0 .  levofloxacin (LEVAQUIN) 750 MG tablet, Take 1 tablet (750 mg total) by mouth daily., Disp: 5 tablet, Rfl: 0 .  predniSONE (DELTASONE) 10 MG tablet, 32mX2 days, 375mX2 days,  2040m2 days, 19m71mdays, then stop., Disp: 20 tablet, Rfl: 0

## 2016-11-06 NOTE — Telephone Encounter (Signed)
Attempted to contact [facility]. They are not open yet.

## 2016-11-07 DIAGNOSIS — M810 Age-related osteoporosis without current pathological fracture: Secondary | ICD-10-CM | POA: Diagnosis not present

## 2016-11-07 NOTE — Telephone Encounter (Signed)
Called and spoke with the pharmacist and he stated that the caregiver called back and stated that the pt will stop the azithromycin while taking the levaquin---once she is done with the levaquin she will restart the azithromycin.  Nothing further is needed.

## 2016-11-13 DIAGNOSIS — H11153 Pinguecula, bilateral: Secondary | ICD-10-CM | POA: Diagnosis not present

## 2016-11-13 DIAGNOSIS — H04123 Dry eye syndrome of bilateral lacrimal glands: Secondary | ICD-10-CM | POA: Diagnosis not present

## 2016-11-13 DIAGNOSIS — E119 Type 2 diabetes mellitus without complications: Secondary | ICD-10-CM | POA: Diagnosis not present

## 2016-11-13 DIAGNOSIS — H40003 Preglaucoma, unspecified, bilateral: Secondary | ICD-10-CM | POA: Diagnosis not present

## 2016-11-13 DIAGNOSIS — I10 Essential (primary) hypertension: Secondary | ICD-10-CM | POA: Diagnosis not present

## 2016-11-13 DIAGNOSIS — H40023 Open angle with borderline findings, high risk, bilateral: Secondary | ICD-10-CM | POA: Diagnosis not present

## 2016-11-13 DIAGNOSIS — H18413 Arcus senilis, bilateral: Secondary | ICD-10-CM | POA: Diagnosis not present

## 2016-11-13 DIAGNOSIS — Z961 Presence of intraocular lens: Secondary | ICD-10-CM | POA: Diagnosis not present

## 2016-11-13 DIAGNOSIS — H35033 Hypertensive retinopathy, bilateral: Secondary | ICD-10-CM | POA: Diagnosis not present

## 2016-11-17 DIAGNOSIS — R404 Transient alteration of awareness: Secondary | ICD-10-CM | POA: Diagnosis not present

## 2016-11-17 DIAGNOSIS — R5383 Other fatigue: Secondary | ICD-10-CM | POA: Diagnosis not present

## 2016-11-21 ENCOUNTER — Telehealth: Payer: Self-pay | Admitting: Acute Care

## 2016-11-21 NOTE — Telephone Encounter (Signed)
BQ  Please Advise-  You saw this pt on 11/05/16 and you wanted her to come back in two weeks. Pt had a two week f/u with [facility] but due to scheduling changes they had to cancel her appt. No available appts with an NP next week. First available according to Clerical is 7 /23/18. Will it be ok to double book you on either Monday or Tuesday of next week

## 2016-11-24 NOTE — Telephone Encounter (Signed)
Please see if there is anything available with NPs this week

## 2016-11-24 NOTE — Telephone Encounter (Signed)
Message was closed in error.  BQ please advise. thanks

## 2016-11-24 NOTE — Telephone Encounter (Signed)
Bq has an opening tomorrow at 2:30-.  Pt scheduled at this time, aware of appt.  Nothing further needed.

## 2016-11-25 ENCOUNTER — Other Ambulatory Visit (INDEPENDENT_AMBULATORY_CARE_PROVIDER_SITE_OTHER): Payer: Medicare Other

## 2016-11-25 ENCOUNTER — Ambulatory Visit (INDEPENDENT_AMBULATORY_CARE_PROVIDER_SITE_OTHER): Payer: Medicare Other | Admitting: Pulmonary Disease

## 2016-11-25 ENCOUNTER — Other Ambulatory Visit: Payer: Self-pay | Admitting: Pulmonary Disease

## 2016-11-25 ENCOUNTER — Encounter: Payer: Self-pay | Admitting: Pulmonary Disease

## 2016-11-25 VITALS — BP 114/58 | HR 134 | Ht 65.0 in | Wt 218.2 lb

## 2016-11-25 DIAGNOSIS — J9611 Chronic respiratory failure with hypoxia: Secondary | ICD-10-CM

## 2016-11-25 DIAGNOSIS — J849 Interstitial pulmonary disease, unspecified: Secondary | ICD-10-CM

## 2016-11-25 DIAGNOSIS — R0602 Shortness of breath: Secondary | ICD-10-CM

## 2016-11-25 DIAGNOSIS — J432 Centrilobular emphysema: Secondary | ICD-10-CM | POA: Diagnosis not present

## 2016-11-25 LAB — BASIC METABOLIC PANEL
BUN: 17 mg/dL (ref 6–23)
CHLORIDE: 100 meq/L (ref 96–112)
CO2: 31 mEq/L (ref 19–32)
Calcium: 9.5 mg/dL (ref 8.4–10.5)
Creatinine, Ser: 0.95 mg/dL (ref 0.40–1.20)
GFR: 61.46 mL/min (ref >60.00)
GLUCOSE: 144 mg/dL — AB (ref 70–99)
POTASSIUM: 4.7 meq/L (ref 3.5–5.1)
SODIUM: 138 meq/L (ref 135–145)

## 2016-11-25 MED ORDER — METOLAZONE 5 MG PO TABS: ORAL_TABLET | ORAL | 0 refills | Status: DC

## 2016-11-25 MED ORDER — SODIUM CHLORIDE 3 % IN NEBU: INHALATION_SOLUTION | Freq: Two times a day (BID) | RESPIRATORY_TRACT | 11 refills | Status: DC

## 2016-11-25 NOTE — Addendum Note (Signed)
Addended by: Len Blalock on: 11/25/2016 03:32 PM   Modules accepted: Orders

## 2016-11-25 NOTE — Progress Notes (Addendum)
Subjective:    Patient ID: Destiny Shelton, female    DOB: Jan 20, 1946, 71 y.o.   MRN: [id number]  Synopsys: Former patient of Dr. Gwenette Greet with pulmonary hypertension, Afib, and COPD.  She smoked 2 packs per day for 25 years and quit in the 1990's.  She has been on oxygen since 2012.  As of 2017 She has been using 5L O2 at rest and 6 L with exertion.   She does not have a history of PE that she is aware of.    HPI Chief Complaint  Patient presents with  . Follow-up    pt c/o sob, prod cough with green mucus.      Sumayyah said that the Levaquin helped improve her dyspnea and she was allowed ot get up and walk around.  She saw some improvement in the mucus production.  She feels congestion in her chest a lot. She says that she coughs a lot at night and coughs up a lot of mucus.  She has a lot of hard coughing spells.  She continues to take azithromycin daily which helps.  She has chest aching from time to time.  When she has bad coughing spells she feels short winded for a a while afterwards.  Just walking short distances she gets more short of breath.    She continues to take Symbicort and Spiriva. She uses Xopenex sparingly, sometimes never.  She uses her albuterol nebulizer more frequently however, usually two times per day.   Her Leg swelling has been worse recently. She doesn't think the diuresis medicines are working quite as well.   Past Medical History:  Diagnosis Date  . Adenomatous colon polyp   . ALLERGIC RHINITIS   . Anemia    iron deficient  . Anxiety   . Atrial tachycardia (HCC)    Mostly Sinus Tachycardia  . Back pain, chronic   . Carotid stenosis   . CHF (congestive heart failure) ([place])   . Community acquired pneumonia - Recent Admission (07/2013) 07/08/2013  . COPD (chronic obstructive pulmonary disease) ([place])    Requiring Home O2 at 4L  . Depression   . Diabetes mellitus type 2 with neurological manifestations ([place])    On Insulin  . Diverticulosis of colon   .  External hemorrhoids   . Fibromyalgia    "pain in arms and shoulders."  . Gastritis   . GERD (gastroesophageal reflux disease)   . Headache(784.0)    tension  . Hyperlipemia   . Hypertension   . Inappropriate sinus tachycardia   . Morbidly obese ([place])   . Nephrolithiasis   . Neuromuscular disorder ([place])    diabetic neuropathy  . Osteoarthritis   . Osteoporosis   . PAF (paroxysmal atrial fibrillation) ([place])   . 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  . Sleep apnea    no cpap machine  02 at 4l/min all the time  . Urosepsis 05/26/2012      Review of Systems  Constitutional: Negative for chills, diaphoresis, fatigue and fever.  HENT: Negative for postnasal drip, rhinorrhea and sinus pressure.   Respiratory: Positive for cough, shortness of breath and wheezing.   Cardiovascular: Positive for leg swelling. Negative for chest pain and palpitations.       Objective:   Physical Exam Vitals:   11/25/16 1433  BP: (!) 114/58  Pulse: (!) 134  SpO2: (!) 89%  Weight: 218 lb 3.2 oz (99 kg)  Height: _0  (1.651 m)   6L [place]   Walked 300  feet on 6 L nasal cannula and O2 saturation dropped to 86%, needed to increase to 8 L continuous.  Gen: chronically ill appearing HENT: OP clear, TM's clear, neck supple PULM: No wheezing no crackles B, normal percussion CV: RRR, no mgr, trace edema GI: BS+, soft, nontender Derm: no cyanosis or rash Psyche: normal mood and affect      BMET    Component Value Date/Time   NA 138 10/09/2016 0954   K 4.3 10/09/2016 0954   CL 99 (L) 10/09/2016 0954   CO2 30 10/09/2016 0954   GLUCOSE 144 (H) 10/09/2016 0954   BUN 16 10/09/2016 0954   CREATININE 1.05 (H) 10/09/2016 0954   CREATININE 0.85 02/12/2015 0949   CALCIUM 9.6 10/09/2016 0954   GFRNONAA 52 (L) 10/09/2016 0954   GFRAA >60 10/09/2016 0954   Microbiology  BAL February 2018 Pseudomonas  PFT: PFT 2011:  FEV1 1.34 (55%), ratio 68, ++airtrapping on lung volumes, nl TLC, DLCO 40% pred. PFT's   03/23/2015  FEV1 1.61 (68 % ) ratio 69  p no % improvement from saba on   Symb/spiriva January 2018 pulmonary function testing ratio 70%, FEV1 1.63 L 70% predicted, FVC 2.33 L 76% predicted, total lung capacity 4.48 L 86% predicted, DLCO 5. 09/06/2019 percent predicted  Heart Cath: LHC 12/2011: non-obstructive cad RHC 12/2011:  PA 45/29 with mean 13m, PCWP 22, CO Fick 10.25, PVR 1.17 wood units, slight step up in saturation from   RV to PA 07/04/2015 RHC: RA mean 9 RV 45/10 PA 41/20, mean 30 PCWP mean 13  Oxygen saturations: SVC 68% RA 65% RV 61% PA 69% LA 98% Peripheral sat 93%  Cardiac Output (Fick) 6.7  Cardiac Index (Fick) 3.3 PVR 2.5 WU Qp/Qs = 0.97 .  Imaging: CT chest 12/2011:  No PE, very mild mosaic perfusion abnl (prob secondary to her known airflow obstruction).  January 2018 high-resolution CT scan of the chest shows air trapping, irregular distribution upper lobe fibrotic changes (interlobular septal thickening, some patchy ground glass) in a patchy distribution not consistent with usual interstitial pneumonitis  Cardiac imaging: Myoview 2012: no ischemia Echo 2012: nl LV and EF, impaired relaxation, nothing to suggest pulm htn. Echo 71/95/0932  Nl LV, diastolic dysfxn, normal RV, estimated RVSP 46 TEE 03/2012:  A few late bubbles seen felt c/w small IP shunting.  Cardiac MRI 2013:  No infiltrative process.      Assessment & Plan:    Centrilobular emphysema (HCC)  Chronic respiratory failure with hypoxia (HCC)  ILD (interstitial lung disease) ([place]  CHRONIC DYSPNEA   Discussion: MEmelynhas done a little bit better since we treated her with Levaquin recently but she remains profoundly symptomatically. Most of her symptoms come from her chest congestion causing shortness of breath so I'm going to try hypertonic saline to help her intentionally clear out mucus (mucociliary clearance) twice per day to see if this helps. At this point and basically managing ongoing symptoms. I've  looked for an underlying interstitial lung disease with the suspicion that she had hypersensitivity pneumonitis but her BAL was neutrophil predominant so I don't think this is the case. She's not really a candidate to be treated with any sort of immunosuppression with her chronic Pseudomonas colonization and severe COPD.  So basically I'm treating her for severe COPD with chronic Pseudomonas colonization complicated by profound physical deconditioning. She may also have a degree of diastolic heart failure considering the increased swelling she's had lately.  For your chronic respiratory failure  with hypoxemia: We will measure your oxygen while walking on 6 L today Use 8 L with exertion, 5 L at rest  For your COPD: Keep taking Symbicort and Spiriva Keep taking azithromycin daily We will get a 12-lead EKG the next visit  For your chest congestion: We will add hypertonic saline nebulized twice a day, take albuterol nebulized 5 minutes before using the hypertonic saline  For your shortness of breath with leg swelling: We will check a lab test called a pro BNP to look for evidence of heart failure Continue taking Lasix as you're doing Take metolazone 30 minutes prior to your morning dose of Lasix for the next 4 days Report back to me if you think that the extra dose of diuretic medicine made you breathe better  We will see you back in 2-3 months or sooner if needed  Current Outpatient Prescriptions:  .  acetaminophen (TYLENOL) 500 MG tablet, Take 1,000 mg by mouth every 6 (six) hours as needed for mild pain or headache. , Disp: , Rfl:  .  albuterol (PROVENTIL) (2.5 MG/3ML) 0.083% nebulizer solution, Take 3 mLs (2.5 mg total) by nebulization every 6 (six) hours as needed for wheezing or shortness of breath., Disp: 75 mL, Rfl: 3 .  ALPRAZolam (XANAX) 0.25 MG tablet, Take 0.125 mg by mouth at bedtime as needed for anxiety., Disp: , Rfl:  .  atorvastatin (LIPITOR) 40 MG tablet, Take 40 mg by mouth  at bedtime. , Disp: , Rfl:  .  azithromycin (ZITHROMAX) 250 MG tablet, Take 1 tablet (250 mg total) by mouth daily., Disp: 30 tablet, Rfl: 2 .  benzonatate (TESSALON) 200 MG capsule, Take 200 mg by mouth at bedtime. , Disp: , Rfl:  .  buPROPion (WELLBUTRIN XL) 300 MG 24 hr tablet, Take 300 mg by mouth daily., Disp: , Rfl:  .  DULoxetine (CYMBALTA) 60 MG capsule, Take 60 mg by mouth daily. , Disp: , Rfl:  .  furosemide (LASIX) 40 MG tablet, Take 2 tablets (80 mg total) by mouth 2 (two) times daily., Disp: 120 tablet, Rfl: 3 .  gabapentin (NEURONTIN) 600 MG tablet, Take 600 mg by mouth 3 (three) times daily. , Disp: , Rfl:  .  Guaifenesin (MUCINEX MAXIMUM STRENGTH) 1200 MG TB12, Take 1,200 mg by mouth 2 (two) times daily as needed (cough / congestion). , Disp: , Rfl:  .  HYDROcodone-acetaminophen (NORCO/VICODIN) 5-325 MG per tablet, Take 1 tablet by mouth every 4 (four) hours as needed for moderate pain. Reported on 12/12/2015, Disp: , Rfl:  .  insulin glargine (LANTUS) 100 UNIT/ML injection, Inject 0.66 mLs (66 Units total) into the skin daily before breakfast. (Patient taking differently: Inject 64 Units into the skin daily before breakfast. ), Disp: , Rfl:  .  insulin lispro (HUMALOG) 100 UNIT/ML injection, Inject 6 Units into the skin See admin instructions. Inject 6 units subcutaneously twice daily (with breakfast and supper) if CBG >150, Disp: , Rfl:  .  irbesartan (AVAPRO) 150 MG tablet, Take 1 tablet (150 mg total) by mouth daily., Disp: 45 tablet, Rfl: 3 .  iron polysaccharides (NIFEREX) 150 MG capsule, Take 1 capsule (150 mg total) by mouth 2 (two) times daily., Disp: 60 capsule, Rfl: 0 .  levalbuterol (XOPENEX HFA) 45 MCG/ACT inhaler, Inhale 1-2 puffs into the lungs every 4 (four) hours as needed for wheezing. (Patient taking differently: Inhale 2 puffs into the lungs every 4 (four) hours as needed for wheezing or shortness of breath. ), Disp: 1 Inhaler,  Rfl: 12 .  Multiple Vitamin  (MULTIVITAMIN WITH MINERALS) TABS tablet, Take 1 tablet by mouth daily., Disp: , Rfl:  .  omeprazole (PRILOSEC) 20 MG capsule, Take 20 mg by mouth daily after supper. , Disp: , Rfl:  .  ONE TOUCH ULTRA TEST test strip, 1 each by Other route 3 (three) times daily. , Disp: , Rfl: 4 .  OXYGEN, Inhale 5 L into the lungs continuous. , Disp: , Rfl:  .  polyethylene glycol (MIRALAX / GLYCOLAX) packet, Take 17 g by mouth daily. Mix in 8 oz liquid and drink, Disp: , Rfl:  .  polyvinyl alcohol (ARTIFICIAL TEARS) 1.4 % ophthalmic solution, Place 1 drop into both eyes 2 (two) times daily. , Disp: , Rfl:  .  potassium chloride (K-DUR,KLOR-CON) 10 MEQ tablet, Take 3 tablets (30 mEq total) by mouth 2 (two) times daily., Disp: 180 tablet, Rfl: 3 .  rivaroxaban (XARELTO) 20 MG TABS tablet, Take 1 tablet (20 mg total) by mouth daily with supper. Restart on Wednesday May 11 , 2016, Disp: 30 tablet, Rfl:  .  roflumilast (DALIRESP) 500 MCG TABS tablet, Take 500 mcg by mouth daily., Disp: , Rfl:  .  SPIRIVA HANDIHALER 18 MCG inhalation capsule, INHALE THE CONTENTS OF 1 CAPSULE VIA HANDIHALER BY MOUTH EVERY MORNING, Disp: 30 capsule, Rfl: 11 .  SYMBICORT 160-4.5 MCG/ACT inhaler, INHALE 2 PUFFS BY MOUTH TWICE DAILY, Disp: 10.2 g, Rfl: 5 .  verapamil (VERELAN PM) 360 MG 24 hr capsule, TAKE ONE CAPSULE BY MOUTH DAILY, Disp: 90 capsule, Rfl: 0

## 2016-11-25 NOTE — Patient Instructions (Addendum)
For your chronic respiratory failure with hypoxemia: We will measure your oxygen while walking on 6 L today Continue using 8 L with exertion, 5 L at rest  For your COPD: Keep taking Symbicort and Spiriva Keep taking azithromycin daily We will get a 12-lead EKG the next visit  For your chest congestion: We will add hypertonic saline nebulized twice a day, take albuterol nebulized 5 minutes before using the hypertonic saline  For your shortness of breath with leg swelling: We will check a lab test called a pro BNP to look for evidence of heart failure Continue taking Lasix as you're doing Take metolazone 30 minutes prior to your morning dose of Lasix for the next 4 days Report back to me if you think that the extra dose of diuretic medicine made you breathe better  We will see you back in 2-3 months or sooner if needed

## 2016-11-26 ENCOUNTER — Other Ambulatory Visit: Payer: Self-pay | Admitting: Pulmonary Disease

## 2016-11-26 LAB — PRO B NATRIURETIC PEPTIDE: NT-PRO BNP: 42 pg/mL (ref 0–301)

## 2016-11-27 ENCOUNTER — Telehealth: Payer: Self-pay | Admitting: Pulmonary Disease

## 2016-11-27 ENCOUNTER — Ambulatory Visit: Payer: Medicare Other | Admitting: Acute Care

## 2016-11-27 NOTE — Telephone Encounter (Signed)
Notes recorded by Juanito Doom, MD on 11/26/2016 at 12:46 PM EDT A, Please let the patient know this was OK Thanks, B  Spoke to patient about lab results. Verbalized understanding. Nothing further needed at time of call.

## 2016-12-01 ENCOUNTER — Telehealth: Payer: Self-pay | Admitting: Pulmonary Disease

## 2016-12-01 NOTE — Telephone Encounter (Signed)
Spoke with pt. She states that at her last OV, BQ gave her Metolazone to take before her Lasix for 4 days. Reports that this medication is working well and wants to know if BQ wants her to continue taking this.  BQ - please advise. Thanks.

## 2016-12-01 NOTE — Telephone Encounter (Signed)
Yes, but I only want her to take it twice per week before lasix and she will need to have a BMET to check her electrolytes in a week.  OK to refill enough for her to take it twice per week, 2 refills

## 2016-12-02 ENCOUNTER — Other Ambulatory Visit: Payer: Self-pay

## 2016-12-02 DIAGNOSIS — Z79899 Other long term (current) drug therapy: Secondary | ICD-10-CM

## 2016-12-02 NOTE — Telephone Encounter (Signed)
Spoke with patient. She is aware of the medication frequency. She stated that [facility] gave her a 90 day supply. Advised patient that that was too much medication. Also advised patient that BQ wants her to have a lab test done in one week. She verbalized understanding. Order has been placed. Nothing else needed at time of call.

## 2016-12-04 ENCOUNTER — Telehealth: Payer: Self-pay | Admitting: Pulmonary Disease

## 2016-12-04 NOTE — Telephone Encounter (Signed)
Spoke with pt. Advised her that it did not matter either way if she was fasting. BQ wants a BMET done to check her electrolytes. Nothing further was needed.

## 2016-12-07 ENCOUNTER — Other Ambulatory Visit: Payer: Self-pay | Admitting: Cardiology

## 2016-12-09 ENCOUNTER — Other Ambulatory Visit (INDEPENDENT_AMBULATORY_CARE_PROVIDER_SITE_OTHER): Payer: Medicare Other

## 2016-12-09 DIAGNOSIS — Z79899 Other long term (current) drug therapy: Secondary | ICD-10-CM | POA: Diagnosis not present

## 2016-12-09 LAB — BASIC METABOLIC PANEL
BUN: 19 mg/dL (ref 6–23)
CALCIUM: 9.2 mg/dL (ref 8.4–10.5)
CO2: 34 mEq/L — ABNORMAL HIGH (ref 19–32)
CREATININE: 0.93 mg/dL (ref 0.40–1.20)
Chloride: 97 mEq/L (ref 96–112)
GFR: 62.98 mL/min (ref >60.00)
GLUCOSE: 126 mg/dL — AB (ref 70–99)
Potassium: 4.5 mEq/L (ref 3.5–5.1)
Sodium: 139 mEq/L (ref 135–145)

## 2016-12-30 ENCOUNTER — Ambulatory Visit (HOSPITAL_COMMUNITY)
Admission: RE | Admit: 2016-12-30 | Discharge: 2016-12-30 | Disposition: A | Discharge status: Home / Self Care | Payer: Medicare Other | Source: Ambulatory Visit | Attending: Cardiology | Admitting: Cardiology

## 2016-12-30 ENCOUNTER — Encounter (HOSPITAL_COMMUNITY): Payer: Self-pay | Admitting: Cardiology

## 2016-12-30 VITALS — BP 111/53 | HR 102 | Wt 220.5 lb

## 2016-12-30 DIAGNOSIS — I272 Pulmonary hypertension, unspecified: Secondary | ICD-10-CM | POA: Diagnosis not present

## 2016-12-30 DIAGNOSIS — J849 Interstitial pulmonary disease, unspecified: Secondary | ICD-10-CM | POA: Diagnosis not present

## 2016-12-30 DIAGNOSIS — G4733 Obstructive sleep apnea (adult) (pediatric): Secondary | ICD-10-CM | POA: Insufficient documentation

## 2016-12-30 DIAGNOSIS — Z9981 Dependence on supplemental oxygen: Secondary | ICD-10-CM | POA: Diagnosis not present

## 2016-12-30 DIAGNOSIS — I110 Hypertensive heart disease with heart failure: Secondary | ICD-10-CM | POA: Diagnosis not present

## 2016-12-30 DIAGNOSIS — I480 Paroxysmal atrial fibrillation: Secondary | ICD-10-CM | POA: Insufficient documentation

## 2016-12-30 DIAGNOSIS — I5032 Chronic diastolic (congestive) heart failure: Secondary | ICD-10-CM

## 2016-12-30 DIAGNOSIS — Z794 Long term (current) use of insulin: Secondary | ICD-10-CM | POA: Insufficient documentation

## 2016-12-30 DIAGNOSIS — J984 Other disorders of lung: Secondary | ICD-10-CM | POA: Insufficient documentation

## 2016-12-30 DIAGNOSIS — Z6836 Body mass index (BMI) 36.0-36.9, adult: Secondary | ICD-10-CM | POA: Diagnosis not present

## 2016-12-30 DIAGNOSIS — J449 Chronic obstructive pulmonary disease, unspecified: Secondary | ICD-10-CM | POA: Insufficient documentation

## 2016-12-30 DIAGNOSIS — E119 Type 2 diabetes mellitus without complications: Secondary | ICD-10-CM | POA: Insufficient documentation

## 2016-12-30 DIAGNOSIS — Z79899 Other long term (current) drug therapy: Secondary | ICD-10-CM | POA: Diagnosis not present

## 2016-12-30 DIAGNOSIS — Z7901 Long term (current) use of anticoagulants: Secondary | ICD-10-CM | POA: Insufficient documentation

## 2016-12-30 DIAGNOSIS — E669 Obesity, unspecified: Secondary | ICD-10-CM | POA: Diagnosis not present

## 2016-12-30 DIAGNOSIS — R000 Tachycardia, unspecified: Secondary | ICD-10-CM | POA: Diagnosis not present

## 2016-12-30 LAB — BASIC METABOLIC PANEL
Anion gap: 8 (ref 5–15)
BUN: 26 mg/dL — ABNORMAL HIGH (ref 6–20)
CHLORIDE: 95 mmol/L — AB (ref 101–111)
CO2: 32 mmol/L (ref 22–32)
Calcium: 9.1 mg/dL (ref 8.9–10.3)
Creatinine, Ser: 1.06 mg/dL — ABNORMAL HIGH (ref 0.44–1.00)
GFR calc Af Amer: 59 mL/min — ABNORMAL LOW (ref >60)
GFR calc non Af Amer: 51 mL/min — ABNORMAL LOW (ref >60)
GLUCOSE: 116 mg/dL — AB (ref 65–99)
POTASSIUM: 4.2 mmol/L (ref 3.5–5.1)
Sodium: 135 mmol/L (ref 135–145)

## 2016-12-30 NOTE — Progress Notes (Signed)
Patient ID: Destiny Shelton, female   DOB: 30-Sep-1945, 71 y.o.   MRN: [id number]  PCP: Dr. Brigitte Pulse HF Cardiology: Dr. Aundra Dubin  71 yo with history of paroxysmal atrial fibrillation, chronic diastolic CHF with restrictive hemodynamics on 2013 RHC, COPD on home oxygen, inappropriate sinus tachycardia, and concern for intracardiac shunting presents for CHF clinic evaluation.  She has been followed for several years by both cardiology and pulmonology.  She is a prior smoker and has been diagnosed with COPD.  Interestingly, her PFTs in 10/16 showed improvement, suggesting mild obstructive airways disease.  However, she still requires home oxygen.  She had transcranial dopplers in 2013 suggesting a medium-sized right to left shunt.  This led to an extensive workup.  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  Cardiac MRI was unremarkable, no evidence for shunt lesion.  RHC/LHC showed no coronary disease but it did show restrictive hemodynamics and volume overload.  Qp/Qs from this study was 1.27/1, suggesting a relatively small left to right shunt.  A definite shunt lesion was never identified.    Echo in 2/17 showed normal LV size and systolic function and normal RV.  I also did a RHC in 2/17: on higher dose diuretics, she had mild pulmonary hypertension and normal PCWP.  There was no evidence for a shunt lesion with Qp/Qs 0.97.   She was admitted in 9/17 with acute exacerbation of COPD.  She was treated with steroids and nebs.  She was admitted in 12/17 with suspected COPD exacerbation, treated with steroids and levofloxacin.  Lasix was decreased to 80 mg once daily but I increased it back to 80 qam/40 qpm when I saw her in followup, later up to Lasix 80 mg bid.    She saw Dr. Lake Bells recently with increased dyspnea.  She was started on metolazone twice a week.    She remains short of breath with mild exertion, this has not changed with increased diuresis.  Dyspnea has gradually worsened over  time.  Using walker or cane.  No BRBPR/melena.  No chest pain.    Labs (9/16): digoxin 0.6, K 4.3, creatinine 0.85 Labs (1/17): K 3.9, creatinine 0.96, hgb 8.9 Labs (3/17): K 4.2, creatinine 0.96 Labs (6/17): K 4.2, creatinine 1.03 Labs (8/17): K 4.1, creatinine 0.81, HCT 34.3 Labs (9/17): K 3.7, creatinine 0.91, hgb 12.1 Labs (12/17): K 3.4, creatinine 0.84, hgb 13 Labs (1/18): K 4.2, creatinine 1.27 Labs (6/18): pro-BNP 42 Labs (7/18): K 4.5, creatinine 0.93  PMH: 1. Atrial fibrillation: Paroxysmal.  2. HTN 3. COPD: Home oxygen.  PFTs (10/16) with FVC 77%, FEV1 68%, TLC 89% => mild obstruction (improved from the past).   4. Type II diabetes 5. Obesity 6. Inappropriate sinus tachycardia 7. OSA: Does not use CPAP, uses oxygen at night.  8. Chronic diastolic CHF: RHC/LHC in 3/29 with no significant CAD, mean RA 19, RV 46/18, PA 45/29 mean 37, mean PCWP 22, CI 4.1, PVR 1.4 WU => restrictive hemodynamics with no evidence for constriction;  Qp/Qs 1.27/1; saturation run difficult to interpret.  TEE (10/13): EF 55-60%, LVH, mild MR, bubble study showed no evidence for immediate R=>L bubble crossing, small amount of bubbles ended up in left heart > 5 seconds after injection suggesting possible intrapulmonary shunting; no shunt by color doppler.  Cardiac MRI (8/13) with EF 59%, no LGE, normal RV size/systolic function => interpreted as normal.  RHC 07/04/2015: RA mean 9, RV 45/10, PA 41/20 mean 30, PCWP mean 13, no step up on  shunt run,cardiac output (Fick) 6.7 cardiac index (Fick) 3.3, PVR 2.5 WU, Qp/Qs = 0.97. Echo (2/17) with EF 55-60%, mild LVH, mild MR, normal RV size and systolic function.  9. ?Intracardiac shunt: Transcranial dopplers in 10/13 were suggestive of medium-sized right to left shunt.  Bubble study on TEE in 10/13 showed no evidence for immediate R=>L bubble crossing, small amount of bubbles ended up in left heart > 5 seconds after injection suggesting possible intrapulmonary shunting;  no shunt by color doppler. Cardiac MRI showed no evidence for shunting.  Cardiac cath in 2013 actually was suggestive of a small left to right shunt.  Cardiac cath in 2/17 NOT suggestive of significant shunt, Qp/Qs 0.97.  10. Interstitial lung disease: HRCT in 1/18 was concerning for ILD, most likely hypersensitivity pneumonitis.   SH: Married, prior heavy smoker (quit 1996).  No ETOH.   Family History  Problem Relation Age of Onset  . Heart disease Mother   . Stroke Mother   . Heart disease Father   . Heart disease Brother   . Stroke Brother   . Skin cancer Brother   . Colon polyps Sister   . Diabetes Sister   . Diabetes Brother   . Irritable bowel syndrome Daughter   . Colon cancer Neg Hx    ROS: All systems reviewed and negative except as per HPI.   Current Outpatient Prescriptions  Medication Sig Dispense Refill  . acetaminophen (TYLENOL) 500 MG tablet Take 1,000 mg by mouth every 6 (six) hours as needed for mild pain or headache.     . albuterol (PROVENTIL) (2.5 MG/3ML) 0.083% nebulizer solution Take 3 mLs (2.5 mg total) by nebulization every 6 (six) hours as needed for wheezing or shortness of breath. 75 mL 3  . ALPRAZolam (XANAX) 0.25 MG tablet Take 0.125 mg by mouth at bedtime as needed for anxiety.    Marland Kitchen atorvastatin (LIPITOR) 40 MG tablet Take 40 mg by mouth at bedtime.     Marland Kitchen azithromycin (ZITHROMAX) 250 MG tablet Take 250 mg by mouth daily.    . benzonatate (TESSALON) 200 MG capsule Take 200 mg by mouth at bedtime.     Marland Kitchen buPROPion (WELLBUTRIN XL) 300 MG 24 hr tablet Take 300 mg by mouth daily.    . DULoxetine (CYMBALTA) 60 MG capsule Take 60 mg by mouth daily.     . furosemide (LASIX) 40 MG tablet Take 2 tablets (80 mg total) by mouth 2 (two) times daily. 120 tablet 3  . gabapentin (NEURONTIN) 600 MG tablet Take 600 mg by mouth 3 (three) times daily.     . Guaifenesin (MUCINEX MAXIMUM STRENGTH) 1200 MG TB12 Take 1,200 mg by mouth 2 (two) times daily as needed (cough /  congestion).     Marland Kitchen HYDROcodone-acetaminophen (NORCO/VICODIN) 5-325 MG per tablet Take 1 tablet by mouth every 4 (four) hours as needed for moderate pain. Reported on 12/12/2015    . insulin glargine (LANTUS) 100 UNIT/ML injection Inject 0.66 mLs (66 Units total) into the skin daily before breakfast. (Patient taking differently: Inject 64 Units into the skin daily before breakfast. )    . insulin lispro (HUMALOG) 100 UNIT/ML injection Inject 6 Units into the skin See admin instructions. Inject 6 units subcutaneously twice daily (with breakfast and supper) if CBG >150    . irbesartan (AVAPRO) 150 MG tablet Take 1 tablet (150 mg total) by mouth daily. 45 tablet 3  . iron polysaccharides (NIFEREX) 150 MG capsule Take 1 capsule (150 mg total) by  mouth 2 (two) times daily. 60 capsule 0  . levalbuterol (XOPENEX HFA) 45 MCG/ACT inhaler Inhale 1-2 puffs into the lungs every 4 (four) hours as needed for wheezing. (Patient taking differently: Inhale 2 puffs into the lungs every 4 (four) hours as needed for wheezing or shortness of breath. ) 1 Inhaler 12  . metolazone (ZAROXOLYN) 5 MG tablet Take 5 mg by mouth 2 (two) times a week.    . Multiple Vitamin (MULTIVITAMIN WITH MINERALS) TABS tablet Take 1 tablet by mouth daily.    Marland Kitchen omeprazole (PRILOSEC) 20 MG capsule Take 20 mg by mouth daily after supper.     . ONE TOUCH ULTRA TEST test strip 1 each by Other route 3 (three) times daily.   4  . OXYGEN Inhale 5 L into the lungs continuous.     . polyethylene glycol (MIRALAX / GLYCOLAX) packet Take 17 g by mouth daily. Mix in 8 oz liquid and drink    . polyvinyl alcohol (ARTIFICIAL TEARS) 1.4 % ophthalmic solution Place 1 drop into both eyes 2 (two) times daily.     . potassium chloride (K-DUR,KLOR-CON) 10 MEQ tablet Take 3 tablets (30 mEq total) by mouth 2 (two) times daily. 180 tablet 3  . rivaroxaban (XARELTO) 20 MG TABS tablet Take 1 tablet (20 mg total) by mouth daily with supper. Restart on Wednesday May 11 ,  2016 30 tablet   . roflumilast (DALIRESP) 500 MCG TABS tablet Take 500 mcg by mouth daily.    . sodium chloride HYPERTONIC 3 % nebulizer solution Take by nebulization 2 (two) times daily. 750 mL 11  . SPIRIVA HANDIHALER 18 MCG inhalation capsule INHALE THE CONTENTS OF 1 CAPSULE VIA HANDIHALER BY MOUTH EVERY MORNING 30 capsule 11  . SYMBICORT 160-4.5 MCG/ACT inhaler INHALE 2 PUFFS BY MOUTH TWICE DAILY 10.2 g 5  . verapamil (VERELAN PM) 360 MG 24 hr capsule TAKE ONE CAPSULE BY MOUTH DAILY 90 capsule 0   No current facility-administered medications for this encounter.    BP (!) 111/53   Pulse (!) 102   Wt 220 lb 8 oz (100 kg)   SpO2 97% Comment: on 3L of O2  BMI 36.69 kg/m  General: NAD Neck: No JVD, no thyromegaly or thyroid nodule.  Lungs: Dry crackles at bases bilaterally.  CV: Nondisplaced PMI.  Heart regular S1/S2, no S3/S4, no murmur.  1+ ankle edema.  No carotid bruit.  Normal pedal pulses.  Abdomen: Soft, nontender, no hepatosplenomegaly, no distention.  Skin: Intact without lesions or rashes.  Neurologic: Alert and oriented x 3.  Psych: Normal affect. Extremities: No clubbing or cyanosis.  HEENT: Normal.   Assessment/Plan: 1. COPD: On home oxygen, recenlty increased.  PFTs in 10/16 actually showed only mild obstruction (improved).  Breathing has improved in the past with increased diuresis, suggesting that CHF plays a significant role in her symptoms in addition to COPD.  However, on exam today she looks euvolemic.  I think that most of her dyspnea at this point is from lung disease.   2. Atrial fibrillation: Paroxysmal, CHADSVASC = 4.  HR regular on exam today, and she has not felt atrial fibrillation recently.   - She will continue Xarelto.  - She can continue verapamil.     3. Chronic diastolic CHF: NYHA class III symptoms, likely due to combination of CHF and COPD/ILD.  I suspect that her parenchymal lung disease, COPD and ILD, is the major contributor to her symptoms  currently.  She looks near-euvolemic on  exam.  Would not increase diuretics any further today.  - Continue Lasix 80 mg bid with metolazone twice a week. BMET today.  4. ?Shunt lesion: She had transcranial dopplers in 2013 suggesting a medium-sized right to left shunt.  This led to an extensive workup.  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  Cardiac MRI was unremarkable, no evidence for shunt lesion.  RHC/LHC in 2013 showed no coronary disease but it did show restrictive hemodynamics and volume overload.  Qp/Qs from this study was 1.27/1, suggesting a relatively small left to right shunt.  A definite shunt lesion was never identified.  Based on prior workup, it seems most likely that the right to left shunting by transcranial dopplers and TEE bubble study was due to pulmonary AVMs.  RHC repeated in 2/17 did not show a significant shunt lesion, with Qp/Qs 0.97.  5. Inappropriate sinus tachycardia: Had discussed using Corlanor in the past but interacts with verapamil.  6. Pulmonary hypertension: Mild pulmonary hypertension on RHC with PVR only 2.5 WU.  Probably due to COPD/ILD (group 3) and elevated left atrial pressure (group 2).  No specific treatment other than diuresis.  7. Interstitial lung disease: Possible hypersensitivity pneumonitis.  Followed by Dr. Lake Bells.  Dyspnea gradually worsening over time.   Followup in 4 months.   Loralie Champagne Dec 28, 202018

## 2016-12-30 NOTE — Patient Instructions (Signed)
Labs today (will call for abnormal results, otherwise no news is good news)  Follow up in 4 months 

## 2016-12-31 ENCOUNTER — Other Ambulatory Visit (HOSPITAL_COMMUNITY): Payer: Self-pay | Admitting: Cardiology

## 2016-12-31 MED ORDER — IRBESARTAN 150 MG PO TABS: 150.0000 mg | ORAL_TABLET | Freq: Every day | ORAL | 3 refills | Status: DC

## 2017-01-02 ENCOUNTER — Telehealth: Payer: Self-pay | Admitting: Pulmonary Disease

## 2017-01-02 NOTE — Telephone Encounter (Signed)
Called and spoke with pt and she stated that she is having issues with her POC.  She stated that she called and spoke with [facility] and they told her that they did not have the order.    I called and spoke with Corene Cornea and he stated that he has updated the pts chart and that everything is correct now. They have mailed out to the pt new water bottles for her POC and the pt is aware.

## 2017-01-07 DIAGNOSIS — E1149 Type 2 diabetes mellitus with other diabetic neurological complication: Secondary | ICD-10-CM | POA: Diagnosis not present

## 2017-01-07 DIAGNOSIS — I4891 Unspecified atrial fibrillation: Secondary | ICD-10-CM | POA: Diagnosis not present

## 2017-01-07 DIAGNOSIS — E1129 Type 2 diabetes mellitus with other diabetic kidney complication: Secondary | ICD-10-CM | POA: Diagnosis not present

## 2017-01-07 DIAGNOSIS — Z6836 Body mass index (BMI) 36.0-36.9, adult: Secondary | ICD-10-CM | POA: Diagnosis not present

## 2017-01-07 DIAGNOSIS — I5032 Chronic diastolic (congestive) heart failure: Secondary | ICD-10-CM | POA: Diagnosis not present

## 2017-01-07 DIAGNOSIS — E784 Other hyperlipidemia: Secondary | ICD-10-CM | POA: Diagnosis not present

## 2017-01-07 DIAGNOSIS — M810 Age-related osteoporosis without current pathological fracture: Secondary | ICD-10-CM | POA: Diagnosis not present

## 2017-01-07 DIAGNOSIS — I10 Essential (primary) hypertension: Secondary | ICD-10-CM | POA: Diagnosis not present

## 2017-01-07 DIAGNOSIS — Z7901 Long term (current) use of anticoagulants: Secondary | ICD-10-CM | POA: Diagnosis not present

## 2017-01-07 DIAGNOSIS — J449 Chronic obstructive pulmonary disease, unspecified: Secondary | ICD-10-CM | POA: Diagnosis not present

## 2017-01-09 ENCOUNTER — Telehealth: Payer: Self-pay | Admitting: Pulmonary Disease

## 2017-01-09 NOTE — Telephone Encounter (Signed)
Pt's insurance is not covering Symbicort. PA was submitted through Cover My Meds. Key: [place].  Will await PA response.

## 2017-01-13 DIAGNOSIS — Z6835 Body mass index (BMI) 35.0-35.9, adult: Secondary | ICD-10-CM | POA: Diagnosis not present

## 2017-01-13 DIAGNOSIS — M810 Age-related osteoporosis without current pathological fracture: Secondary | ICD-10-CM | POA: Diagnosis not present

## 2017-01-15 NOTE — Telephone Encounter (Signed)
Checked Cover My Meds this morning for PA status. Received the following message >> This medication is on your plan's list of covered drugs. Prior authorization is not required at this time.

## 2017-01-16 ENCOUNTER — Other Ambulatory Visit (HOSPITAL_COMMUNITY): Payer: Self-pay | Admitting: *Deleted

## 2017-01-16 MED ORDER — FUROSEMIDE 40 MG PO TABS: 80.0000 mg | ORAL_TABLET | Freq: Two times a day (BID) | ORAL | 3 refills | Status: DC

## 2017-01-27 ENCOUNTER — Other Ambulatory Visit: Payer: Self-pay | Admitting: Pulmonary Disease

## 2017-01-29 ENCOUNTER — Encounter (HOSPITAL_COMMUNITY): Payer: Self-pay | Admitting: Emergency Medicine

## 2017-01-29 ENCOUNTER — Emergency Department (HOSPITAL_COMMUNITY): Payer: Medicare Other

## 2017-01-29 ENCOUNTER — Inpatient Hospital Stay (HOSPITAL_COMMUNITY)
Admission: EM | Admit: 2017-01-29 | Discharge: 2017-02-01 | DRG: 871 | Disposition: A | Discharge status: Home / Self Care | Payer: Medicare Other | Attending: Internal Medicine | Admitting: Internal Medicine

## 2017-01-29 DIAGNOSIS — A4181 Sepsis due to Enterococcus: Principal | ICD-10-CM | POA: Diagnosis present

## 2017-01-29 DIAGNOSIS — I480 Paroxysmal atrial fibrillation: Secondary | ICD-10-CM | POA: Diagnosis present

## 2017-01-29 DIAGNOSIS — R7881 Bacteremia: Secondary | ICD-10-CM | POA: Diagnosis not present

## 2017-01-29 DIAGNOSIS — Z794 Long term (current) use of insulin: Secondary | ICD-10-CM | POA: Diagnosis not present

## 2017-01-29 DIAGNOSIS — J181 Lobar pneumonia, unspecified organism: Secondary | ICD-10-CM

## 2017-01-29 DIAGNOSIS — Z886 Allergy status to analgesic agent status: Secondary | ICD-10-CM | POA: Diagnosis not present

## 2017-01-29 DIAGNOSIS — J189 Pneumonia, unspecified organism: Secondary | ICD-10-CM | POA: Diagnosis present

## 2017-01-29 DIAGNOSIS — D62 Acute posthemorrhagic anemia: Secondary | ICD-10-CM | POA: Diagnosis present

## 2017-01-29 DIAGNOSIS — Z8249 Family history of ischemic heart disease and other diseases of the circulatory system: Secondary | ICD-10-CM | POA: Diagnosis not present

## 2017-01-29 DIAGNOSIS — Z9049 Acquired absence of other specified parts of digestive tract: Secondary | ICD-10-CM

## 2017-01-29 DIAGNOSIS — K219 Gastro-esophageal reflux disease without esophagitis: Secondary | ICD-10-CM | POA: Diagnosis present

## 2017-01-29 DIAGNOSIS — J440 Chronic obstructive pulmonary disease with acute lower respiratory infection: Secondary | ICD-10-CM | POA: Diagnosis present

## 2017-01-29 DIAGNOSIS — I10 Essential (primary) hypertension: Secondary | ICD-10-CM

## 2017-01-29 DIAGNOSIS — Z823 Family history of stroke: Secondary | ICD-10-CM

## 2017-01-29 DIAGNOSIS — M1990 Unspecified osteoarthritis, unspecified site: Secondary | ICD-10-CM | POA: Diagnosis present

## 2017-01-29 DIAGNOSIS — E1149 Type 2 diabetes mellitus with other diabetic neurological complication: Secondary | ICD-10-CM | POA: Diagnosis present

## 2017-01-29 DIAGNOSIS — K5730 Diverticulosis of large intestine without perforation or abscess without bleeding: Secondary | ICD-10-CM | POA: Diagnosis present

## 2017-01-29 DIAGNOSIS — Z6835 Body mass index (BMI) 35.0-35.9, adult: Secondary | ICD-10-CM | POA: Diagnosis not present

## 2017-01-29 DIAGNOSIS — F419 Anxiety disorder, unspecified: Secondary | ICD-10-CM | POA: Diagnosis present

## 2017-01-29 DIAGNOSIS — I5032 Chronic diastolic (congestive) heart failure: Secondary | ICD-10-CM | POA: Diagnosis present

## 2017-01-29 DIAGNOSIS — J209 Acute bronchitis, unspecified: Secondary | ICD-10-CM | POA: Diagnosis not present

## 2017-01-29 DIAGNOSIS — R0600 Dyspnea, unspecified: Secondary | ICD-10-CM | POA: Diagnosis not present

## 2017-01-29 DIAGNOSIS — I110 Hypertensive heart disease with heart failure: Secondary | ICD-10-CM | POA: Diagnosis present

## 2017-01-29 DIAGNOSIS — M810 Age-related osteoporosis without current pathological fracture: Secondary | ICD-10-CM | POA: Diagnosis present

## 2017-01-29 DIAGNOSIS — M797 Fibromyalgia: Secondary | ICD-10-CM | POA: Diagnosis present

## 2017-01-29 DIAGNOSIS — I272 Pulmonary hypertension, unspecified: Secondary | ICD-10-CM | POA: Diagnosis present

## 2017-01-29 DIAGNOSIS — D649 Anemia, unspecified: Secondary | ICD-10-CM | POA: Diagnosis not present

## 2017-01-29 DIAGNOSIS — Z8371 Family history of colonic polyps: Secondary | ICD-10-CM

## 2017-01-29 DIAGNOSIS — J449 Chronic obstructive pulmonary disease, unspecified: Secondary | ICD-10-CM | POA: Diagnosis not present

## 2017-01-29 DIAGNOSIS — Z90710 Acquired absence of both cervix and uterus: Secondary | ICD-10-CM

## 2017-01-29 DIAGNOSIS — D509 Iron deficiency anemia, unspecified: Secondary | ICD-10-CM | POA: Diagnosis present

## 2017-01-29 DIAGNOSIS — E662 Morbid (severe) obesity with alveolar hypoventilation: Secondary | ICD-10-CM | POA: Diagnosis present

## 2017-01-29 DIAGNOSIS — N179 Acute kidney failure, unspecified: Secondary | ICD-10-CM | POA: Diagnosis present

## 2017-01-29 DIAGNOSIS — E785 Hyperlipidemia, unspecified: Secondary | ICD-10-CM | POA: Diagnosis present

## 2017-01-29 DIAGNOSIS — B952 Enterococcus as the cause of diseases classified elsewhere: Secondary | ICD-10-CM | POA: Diagnosis present

## 2017-01-29 DIAGNOSIS — I2510 Atherosclerotic heart disease of native coronary artery without angina pectoris: Secondary | ICD-10-CM | POA: Diagnosis present

## 2017-01-29 DIAGNOSIS — J441 Chronic obstructive pulmonary disease with (acute) exacerbation: Secondary | ICD-10-CM | POA: Diagnosis present

## 2017-01-29 DIAGNOSIS — I509 Heart failure, unspecified: Secondary | ICD-10-CM | POA: Diagnosis not present

## 2017-01-29 DIAGNOSIS — R918 Other nonspecific abnormal finding of lung field: Secondary | ICD-10-CM | POA: Diagnosis not present

## 2017-01-29 DIAGNOSIS — Z808 Family history of malignant neoplasm of other organs or systems: Secondary | ICD-10-CM | POA: Diagnosis not present

## 2017-01-29 DIAGNOSIS — I4891 Unspecified atrial fibrillation: Secondary | ICD-10-CM | POA: Diagnosis not present

## 2017-01-29 DIAGNOSIS — R040 Epistaxis: Secondary | ICD-10-CM | POA: Diagnosis not present

## 2017-01-29 DIAGNOSIS — Z9981 Dependence on supplemental oxygen: Secondary | ICD-10-CM

## 2017-01-29 DIAGNOSIS — Z87442 Personal history of urinary calculi: Secondary | ICD-10-CM

## 2017-01-29 DIAGNOSIS — E876 Hypokalemia: Secondary | ICD-10-CM | POA: Diagnosis not present

## 2017-01-29 DIAGNOSIS — Z96651 Presence of right artificial knee joint: Secondary | ICD-10-CM | POA: Diagnosis present

## 2017-01-29 DIAGNOSIS — A419 Sepsis, unspecified organism: Secondary | ICD-10-CM | POA: Diagnosis present

## 2017-01-29 DIAGNOSIS — Z7901 Long term (current) use of anticoagulants: Secondary | ICD-10-CM

## 2017-01-29 DIAGNOSIS — J309 Allergic rhinitis, unspecified: Secondary | ICD-10-CM | POA: Diagnosis present

## 2017-01-29 DIAGNOSIS — E119 Type 2 diabetes mellitus without complications: Secondary | ICD-10-CM | POA: Diagnosis not present

## 2017-01-29 DIAGNOSIS — J9621 Acute and chronic respiratory failure with hypoxia: Secondary | ICD-10-CM | POA: Diagnosis present

## 2017-01-29 DIAGNOSIS — Z833 Family history of diabetes mellitus: Secondary | ICD-10-CM

## 2017-01-29 DIAGNOSIS — Z86010 Personal history of colonic polyps: Secondary | ICD-10-CM

## 2017-01-29 DIAGNOSIS — Z87891 Personal history of nicotine dependence: Secondary | ICD-10-CM | POA: Diagnosis not present

## 2017-01-29 DIAGNOSIS — R031 Nonspecific low blood-pressure reading: Secondary | ICD-10-CM | POA: Diagnosis not present

## 2017-01-29 LAB — PREPARE RBC (CROSSMATCH)

## 2017-01-29 LAB — GLUCOSE, CAPILLARY
GLUCOSE-CAPILLARY: 119 mg/dL — AB (ref 65–99)
Glucose-Capillary: 116 mg/dL — ABNORMAL HIGH (ref 65–99)
Glucose-Capillary: 175 mg/dL — ABNORMAL HIGH (ref 65–99)

## 2017-01-29 LAB — CBC WITH DIFFERENTIAL/PLATELET
BASOS PCT: 0 %
Basophils Absolute: 0 10*3/uL (ref 0.0–0.1)
Eosinophils Absolute: 0.3 10*3/uL (ref 0.0–0.7)
Eosinophils Relative: 2 %
HEMATOCRIT: 27.4 % — AB (ref 36.0–46.0)
HEMOGLOBIN: 7.8 g/dL — AB (ref 12.0–15.0)
Lymphocytes Relative: 11 %
Lymphs Abs: 1.6 10*3/uL (ref 0.7–4.0)
MCH: 22.3 pg — ABNORMAL LOW (ref 26.0–34.0)
MCHC: 28.5 g/dL — AB (ref 30.0–36.0)
MCV: 78.5 fL (ref 78.0–100.0)
MONOS PCT: 8 %
Monocytes Absolute: 1.1 10*3/uL — ABNORMAL HIGH (ref 0.1–1.0)
NEUTROS ABS: 11.4 10*3/uL — AB (ref 1.7–7.7)
NEUTROS PCT: 79 %
Platelets: 333 10*3/uL (ref 150–400)
RBC: 3.49 MIL/uL — AB (ref 3.87–5.11)
RDW: 16.2 % — ABNORMAL HIGH (ref 11.5–15.5)
WBC: 14.4 10*3/uL — AB (ref 4.0–10.5)

## 2017-01-29 LAB — BASIC METABOLIC PANEL
ANION GAP: 12 (ref 5–15)
BUN: 20 mg/dL (ref 6–20)
CHLORIDE: 97 mmol/L — AB (ref 101–111)
CO2: 28 mmol/L (ref 22–32)
Calcium: 8.8 mg/dL — ABNORMAL LOW (ref 8.9–10.3)
Creatinine, Ser: 1.28 mg/dL — ABNORMAL HIGH (ref 0.44–1.00)
GFR calc non Af Amer: 41 mL/min — ABNORMAL LOW (ref >60)
GFR, EST AFRICAN AMERICAN: 47 mL/min — AB (ref >60)
Glucose, Bld: 146 mg/dL — ABNORMAL HIGH (ref 65–99)
POTASSIUM: 3.7 mmol/L (ref 3.5–5.1)
Sodium: 137 mmol/L (ref 135–145)

## 2017-01-29 LAB — CBC
HEMATOCRIT: 28.4 % — AB (ref 36.0–46.0)
HEMATOCRIT: 29.1 % — AB (ref 36.0–46.0)
HEMOGLOBIN: 8.1 g/dL — AB (ref 12.0–15.0)
HEMOGLOBIN: 8.5 g/dL — AB (ref 12.0–15.0)
MCH: 22.6 pg — AB (ref 26.0–34.0)
MCH: 23 pg — ABNORMAL LOW (ref 26.0–34.0)
MCHC: 28.5 g/dL — AB (ref 30.0–36.0)
MCHC: 29.2 g/dL — ABNORMAL LOW (ref 30.0–36.0)
MCV: 79.3 fL (ref 78.0–100.0)
MCV: 78.9 fL (ref 78.0–100.0)
Platelets: 289 10*3/uL (ref 150–400)
Platelets: 284 10*3/uL (ref 150–400)
RBC: 3.58 MIL/uL — ABNORMAL LOW (ref 3.87–5.11)
RBC: 3.69 MIL/uL — ABNORMAL LOW (ref 3.87–5.11)
RDW: 16 % — ABNORMAL HIGH (ref 11.5–15.5)
RDW: 15.9 % — ABNORMAL HIGH (ref 11.5–15.5)
WBC: 12.1 10*3/uL — ABNORMAL HIGH (ref 4.0–10.5)
WBC: 13.5 10*3/uL — ABNORMAL HIGH (ref 4.0–10.5)

## 2017-01-29 LAB — CBG MONITORING, ED: Glucose-Capillary: 83 mg/dL (ref 65–99)

## 2017-01-29 LAB — PROTIME-INR
INR: 1.85
PROTHROMBIN TIME: 21.1 s — AB (ref 11.4–15.2)

## 2017-01-29 LAB — I-STAT CG4 LACTIC ACID, ED: LACTIC ACID, VENOUS: 1.64 mmol/L (ref 0.5–1.9)

## 2017-01-29 LAB — APTT: APTT: 38 s — AB (ref 24–36)

## 2017-01-29 LAB — BRAIN NATRIURETIC PEPTIDE: B Natriuretic Peptide: 21.2 pg/mL (ref 0.0–100.0)

## 2017-01-29 LAB — STREP PNEUMONIAE URINARY ANTIGEN: Strep Pneumo Urinary Antigen: NEGATIVE

## 2017-01-29 MED ORDER — RIVAROXABAN 20 MG PO TABS: 20.0000 mg | ORAL_TABLET | Freq: Every day | ORAL | Status: DC

## 2017-01-29 MED ORDER — INSULIN ASPART 100 UNIT/ML ~~LOC~~ SOLN: 0.0000 [IU] | Freq: Every day | SUBCUTANEOUS | Status: DC

## 2017-01-29 MED ORDER — ROFLUMILAST 500 MCG PO TABS
500.0000 ug | ORAL_TABLET | Freq: Every day | ORAL | Status: DC
  Administered 2017-01-29 – 2017-02-01 (×4): 500 ug via ORAL
  Filled 2017-01-29 (×4): qty 1

## 2017-01-29 MED ORDER — DEXTROSE 5 % IV SOLN
1.0000 g | INTRAVENOUS | Status: DC
  Administered 2017-01-29: 1 g via INTRAVENOUS
  Filled 2017-01-29: qty 10

## 2017-01-29 MED ORDER — SODIUM CHLORIDE 0.9 % IV SOLN
10.0000 mL/h | Freq: Once | INTRAVENOUS | Status: AC
  Administered 2017-01-29: 10 mL/h via INTRAVENOUS

## 2017-01-29 MED ORDER — SODIUM CHLORIDE 3 % IN NEBU
4.0000 mL | INHALATION_SOLUTION | Freq: Two times a day (BID) | RESPIRATORY_TRACT | Status: DC
  Administered 2017-01-30 – 2017-01-31 (×4): 4 mL via RESPIRATORY_TRACT
  Filled 2017-01-29 (×7): qty 4

## 2017-01-29 MED ORDER — SODIUM CHLORIDE 0.9 % IV BOLUS (SEPSIS)
1000.0000 mL | Freq: Once | INTRAVENOUS | Status: AC
  Administered 2017-01-29: 1000 mL via INTRAVENOUS

## 2017-01-29 MED ORDER — IPRATROPIUM-ALBUTEROL 0.5-2.5 (3) MG/3ML IN SOLN
3.0000 mL | Freq: Once | RESPIRATORY_TRACT | Status: AC
  Administered 2017-01-29: 3 mL via RESPIRATORY_TRACT
  Filled 2017-01-29: qty 3

## 2017-01-29 MED ORDER — PANTOPRAZOLE SODIUM 40 MG PO TBEC
40.0000 mg | DELAYED_RELEASE_TABLET | Freq: Every day | ORAL | Status: DC
  Administered 2017-01-29 – 2017-02-01 (×4): 40 mg via ORAL
  Filled 2017-01-29 (×4): qty 1

## 2017-01-29 MED ORDER — CHLORHEXIDINE GLUCONATE 0.12 % MT SOLN
15.0000 mL | Freq: Two times a day (BID) | OROMUCOSAL | Status: DC
  Administered 2017-01-29 – 2017-02-01 (×6): 15 mL via OROMUCOSAL
  Filled 2017-01-29 (×6): qty 15

## 2017-01-29 MED ORDER — VERAPAMIL HCL ER 180 MG PO TBCR
360.0000 mg | EXTENDED_RELEASE_TABLET | Freq: Every day | ORAL | Status: DC
  Administered 2017-01-30 – 2017-02-01 (×3): 360 mg via ORAL
  Filled 2017-01-29 (×4): qty 2

## 2017-01-29 MED ORDER — BUPROPION HCL ER (XL) 150 MG PO TB24
300.0000 mg | ORAL_TABLET | Freq: Every day | ORAL | Status: DC
  Administered 2017-01-29 – 2017-02-01 (×4): 300 mg via ORAL
  Filled 2017-01-29 (×5): qty 2

## 2017-01-29 MED ORDER — ALBUTEROL SULFATE (2.5 MG/3ML) 0.083% IN NEBU
2.5000 mg | INHALATION_SOLUTION | RESPIRATORY_TRACT | Status: DC | PRN
  Administered 2017-01-30: 2.5 mg via RESPIRATORY_TRACT
  Filled 2017-01-29: qty 3

## 2017-01-29 MED ORDER — VANCOMYCIN HCL IN DEXTROSE 750-5 MG/150ML-% IV SOLN
750.0000 mg | Freq: Two times a day (BID) | INTRAVENOUS | Status: DC
  Administered 2017-01-29 – 2017-01-30 (×2): 750 mg via INTRAVENOUS
  Filled 2017-01-29 (×2): qty 150

## 2017-01-29 MED ORDER — ARFORMOTEROL TARTRATE 15 MCG/2ML IN NEBU
15.0000 ug | INHALATION_SOLUTION | Freq: Two times a day (BID) | RESPIRATORY_TRACT | Status: DC
  Administered 2017-01-30 – 2017-02-01 (×5): 15 ug via RESPIRATORY_TRACT
  Filled 2017-01-29 (×5): qty 2

## 2017-01-29 MED ORDER — ALPRAZOLAM 0.25 MG PO TABS
0.1250 mg | ORAL_TABLET | Freq: Every evening | ORAL | Status: DC | PRN
  Administered 2017-01-29 – 2017-01-30 (×2): 0.125 mg via ORAL
  Filled 2017-01-29 (×3): qty 1

## 2017-01-29 MED ORDER — ONDANSETRON HCL 4 MG/2ML IJ SOLN
4.0000 mg | Freq: Four times a day (QID) | INTRAMUSCULAR | Status: DC | PRN
  Administered 2017-01-30 – 2017-01-31 (×4): 4 mg via INTRAVENOUS
  Filled 2017-01-29 (×5): qty 2

## 2017-01-29 MED ORDER — ONDANSETRON HCL 4 MG PO TABS
4.0000 mg | ORAL_TABLET | Freq: Four times a day (QID) | ORAL | Status: DC | PRN
  Filled 2017-01-29: qty 1

## 2017-01-29 MED ORDER — BUDESONIDE 0.5 MG/2ML IN SUSP
0.5000 mg | Freq: Two times a day (BID) | RESPIRATORY_TRACT | Status: DC
  Administered 2017-01-30 – 2017-02-01 (×5): 0.5 mg via RESPIRATORY_TRACT
  Filled 2017-01-29 (×5): qty 2

## 2017-01-29 MED ORDER — FUROSEMIDE 20 MG PO TABS
80.0000 mg | ORAL_TABLET | Freq: Two times a day (BID) | ORAL | Status: DC
  Administered 2017-01-29 – 2017-01-30 (×2): 80 mg via ORAL
  Filled 2017-01-29 (×2): qty 4

## 2017-01-29 MED ORDER — POTASSIUM CHLORIDE CRYS ER 20 MEQ PO TBCR
30.0000 meq | EXTENDED_RELEASE_TABLET | Freq: Two times a day (BID) | ORAL | Status: DC
  Administered 2017-01-29 – 2017-01-30 (×3): 30 meq via ORAL
  Filled 2017-01-29 (×4): qty 1

## 2017-01-29 MED ORDER — OXYMETAZOLINE HCL 0.05 % NA SOLN
1.0000 | Freq: Once | NASAL | Status: AC
  Administered 2017-01-29: 1 via NASAL
  Filled 2017-01-29: qty 15

## 2017-01-29 MED ORDER — POLYETHYLENE GLYCOL 3350 17 G PO PACK
17.0000 g | PACK | Freq: Every day | ORAL | Status: DC
  Administered 2017-01-30 – 2017-01-31 (×2): 17 g via ORAL
  Filled 2017-01-29 (×3): qty 1

## 2017-01-29 MED ORDER — ACETAMINOPHEN 325 MG PO TABS
650.0000 mg | ORAL_TABLET | Freq: Four times a day (QID) | ORAL | Status: DC | PRN
  Administered 2017-01-29 – 2017-01-31 (×4): 650 mg via ORAL
  Filled 2017-01-29 (×4): qty 2

## 2017-01-29 MED ORDER — DEXTROSE 5 % IV SOLN
500.0000 mg | Freq: Once | INTRAVENOUS | Status: AC
  Administered 2017-01-29: 500 mg via INTRAVENOUS
  Filled 2017-01-29: qty 500

## 2017-01-29 MED ORDER — BENZONATATE 100 MG PO CAPS
200.0000 mg | ORAL_CAPSULE | Freq: Every evening | ORAL | Status: DC | PRN
  Administered 2017-01-29: 200 mg via ORAL
  Filled 2017-01-29: qty 2

## 2017-01-29 MED ORDER — ATORVASTATIN CALCIUM 40 MG PO TABS
40.0000 mg | ORAL_TABLET | Freq: Every day | ORAL | Status: DC
  Administered 2017-01-29 – 2017-01-31 (×3): 40 mg via ORAL
  Filled 2017-01-29 (×3): qty 1

## 2017-01-29 MED ORDER — INSULIN ASPART 100 UNIT/ML ~~LOC~~ SOLN
0.0000 [IU] | Freq: Three times a day (TID) | SUBCUTANEOUS | Status: DC
  Administered 2017-01-29: 3 [IU] via SUBCUTANEOUS
  Administered 2017-01-30: 2 [IU] via SUBCUTANEOUS
  Administered 2017-01-30: 3 [IU] via SUBCUTANEOUS
  Administered 2017-01-30: 2 [IU] via SUBCUTANEOUS
  Administered 2017-01-31 (×3): 3 [IU] via SUBCUTANEOUS
  Administered 2017-02-01: 5 [IU] via SUBCUTANEOUS

## 2017-01-29 MED ORDER — DEXTROSE 5 % IV SOLN
1.0000 g | Freq: Once | INTRAVENOUS | Status: AC
  Administered 2017-01-29: 1 g via INTRAVENOUS
  Filled 2017-01-29: qty 10

## 2017-01-29 MED ORDER — TIOTROPIUM BROMIDE MONOHYDRATE 18 MCG IN CAPS
18.0000 ug | ORAL_CAPSULE | Freq: Every day | RESPIRATORY_TRACT | Status: DC
  Administered 2017-01-30: 18 ug via RESPIRATORY_TRACT
  Filled 2017-01-29: qty 5

## 2017-01-29 MED ORDER — ACETAMINOPHEN 650 MG RE SUPP: 650.0000 mg | Freq: Four times a day (QID) | RECTAL | Status: DC | PRN

## 2017-01-29 MED ORDER — POLYVINYL ALCOHOL 1.4 % OP SOLN
1.0000 [drp] | Freq: Two times a day (BID) | OPHTHALMIC | Status: DC
  Administered 2017-01-29 – 2017-02-01 (×6): 1 [drp] via OPHTHALMIC
  Filled 2017-01-29 (×2): qty 15

## 2017-01-29 MED ORDER — GUAIFENESIN ER 600 MG PO TB12
1200.0000 mg | ORAL_TABLET | Freq: Two times a day (BID) | ORAL | Status: DC | PRN
  Administered 2017-01-29 – 2017-01-30 (×2): 1200 mg via ORAL
  Filled 2017-01-29 (×2): qty 2

## 2017-01-29 MED ORDER — DEXTROSE 5 % IV SOLN
500.0000 mg | INTRAVENOUS | Status: DC
  Administered 2017-01-30: 500 mg via INTRAVENOUS
  Filled 2017-01-29: qty 500

## 2017-01-29 MED ORDER — ORAL CARE MOUTH RINSE
15.0000 mL | Freq: Two times a day (BID) | OROMUCOSAL | Status: DC
  Administered 2017-01-30 – 2017-01-31 (×3): 15 mL via OROMUCOSAL

## 2017-01-29 MED ORDER — POLYSACCHARIDE IRON COMPLEX 150 MG PO CAPS
150.0000 mg | ORAL_CAPSULE | Freq: Two times a day (BID) | ORAL | Status: DC
  Administered 2017-01-29 – 2017-02-01 (×6): 150 mg via ORAL
  Filled 2017-01-29 (×7): qty 1

## 2017-01-29 NOTE — ED Notes (Signed)
Pt c/o SOB after breathing treatment pt sats dropped to 71%. Pt has expiratory wheezing on right side. Pt put back on non rebreather. Pt stats went up to 98% respiratory

## 2017-01-29 NOTE — ED Triage Notes (Signed)
Per EMS: Pt on Xerelto and has been having a constant nose bleed 2+ hours. Pt A&Ox4 VSS. Pt has Hx of CHF and COPD and is on 8L [place] at home.  EMS gave 2 puffs of Afrin in the L Nare and placed on a Non-rebreather. Afrin seemed to slow bleeding but Pt has had to have airway suctioned several times.

## 2017-01-29 NOTE — H&P (Addendum)
History and Physical    [name]:[id number] DOB: [birth year] DOA: 01/29/2017  Referring MD/NP/PA: Dr. Leonides Schanz PCP: Marton Redwood, MD  Patient coming from: home via EMS  Chief Complaint: Nosebleed  HPI: Destiny Shelton is a 71 y.o. female with medical history significant of diastolic CHF last EF 55 to 60% with grade 1 diastolic dysfunction, CAD, chronic respiratory failure oxygen dependent of 5-8 L, PAF on chronic anticoagulation of Xarelto, and DM type II; who presents with complaints of a nosebleed last night around 11 PM. Patient reports normally that she has 1-2 nosebleeds week since being on Xarelto. Normally she is able to stop the bleeding by holding pressure to her nose or applying ice. However, last night these measures were unable to stop the bleeding and EMS was called. Over the last few days patient notes associated symptoms of increased productive cough with green sputum, mildly worsened wheeze, and nasal congestion. She is chronically short of breath and feels that her symptoms close to baseline.   ED Course: Upon admission into the emergency department patient was seen to be afebrile, pulse 105-125, respirations 16-26, blood pressures as low as 72/62, and O2 saturations as low as 76% on home oxygen requirements. Patient was given afrin and received nasal packing with resolution of symptoms. O2 saturations improved with placing the patient on a nonrebreather as she was not getting adequate oxygen with nasal cannula due to nasal packing. Labs revealed WBC 14.4, hemoglobin 7.8(hemoglobin previously 11.8 in 08/2016), BUN 20, creatinine 1.28.  Review of Systems: Review of Systems  Constitutional: Positive for malaise/fatigue. Negative for chills and fever.  HENT: Positive for nosebleeds. Negative for ear discharge.   Eyes: Negative for double vision and photophobia.  Respiratory: Positive for cough, sputum production, shortness of breath and wheezing.   Cardiovascular: Positive for  orthopnea. Negative for chest pain and leg swelling.  Gastrointestinal: Negative for abdominal pain, nausea and vomiting.  Genitourinary: Negative for frequency and urgency.  Musculoskeletal: Negative for falls.  Skin: Negative for itching and rash.  Neurological: Negative for focal weakness and seizures.  Endo/Heme/Allergies: Negative for polydipsia. Bruises/bleeds easily.  Psychiatric/Behavioral: Negative for hallucinations and substance abuse.    Past Medical History:  Diagnosis Date  . Adenomatous colon polyp   . ALLERGIC RHINITIS   . Anemia    iron deficient  . Anxiety   . Atrial tachycardia (HCC)    Mostly Sinus Tachycardia  . Back pain, chronic   . Carotid stenosis   . CHF (congestive heart failure) ([place])   . Community acquired pneumonia - Recent Admission (07/2013) 07/08/2013  . COPD (chronic obstructive pulmonary disease) ([place])    Requiring Home O2 at 4L  . Depression   . Diabetes mellitus type 2 with neurological manifestations ([place])    On Insulin  . Diverticulosis of colon   . External hemorrhoids   . Fibromyalgia    "pain in arms and shoulders."  . Gastritis   . GERD (gastroesophageal reflux disease)   . Headache(784.0)    tension  . Hyperlipemia   . Hypertension   . Inappropriate sinus tachycardia   . Morbidly obese ([place])   . Nephrolithiasis   . Neuromuscular disorder ([place])    diabetic neuropathy  . Osteoarthritis   . Osteoporosis   . PAF (paroxysmal atrial fibrillation) ([place])   . 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  . Sleep apnea    no cpap machine  02 at 4l/min all the time  . Urosepsis 05/26/2012    Past Surgical  History:  Procedure Laterality Date  . ABDOMINAL HYSTERECTOMY  1978  . APPENDECTOMY  1963  . CARDIAC CATHETERIZATION N/A 07/04/2015   Procedure: Right Heart Cath;  Surgeon: Larey Dresser, MD;  Location: [place] [facility];  Service: Cardiovascular;  Laterality: N/A;  . CHOLECYSTECTOMY  1963  . COLONOSCOPY N/A 12/21/2012   Procedure: COLONOSCOPY;   Surgeon: Gatha Mayer, MD;  Location: [facility];  Service: Endoscopy;  Laterality: N/A;  . COLONOSCOPY N/A 11/17/2013   Procedure: COLONOSCOPY;  Surgeon: Gatha Mayer, MD;  Location: [facility];  Service: Endoscopy;  Laterality: N/A;  . COLONOSCOPY W/ BIOPSIES AND POLYPECTOMY  07/22/2007, 04/28/2007   2009: diverticulosis, 2008: diverticulosis, adenomatous polyps  . COLONOSCOPY WITH PROPOFOL N/A 10/01/2012   Procedure: COLONOSCOPY WITH PROPOFOL;  Surgeon: Gatha Mayer, MD;  Location: [facility];  Service: Endoscopy;  Laterality: N/A;  . COLONOSCOPY WITH PROPOFOL N/A 10/10/2014   Procedure: COLONOSCOPY WITH PROPOFOL;  Surgeon: Gatha Mayer, MD;  Location: [facility];  Service: Endoscopy;  Laterality: N/A;  . ESOPHAGOGASTRODUODENOSCOPY (EGD) WITH PROPOFOL N/A 10/01/2012   Procedure: ESOPHAGOGASTRODUODENOSCOPY (EGD) WITH PROPOFOL;  Surgeon: Gatha Mayer, MD;  Location: [facility];  Service: Endoscopy;  Laterality: N/A;  . HOT HEMOSTASIS N/A 12/21/2012   Procedure: HOT HEMOSTASIS (ARGON PLASMA COAGULATION/BICAP);  Surgeon: Gatha Mayer, MD;  Location: [facility];  Service: Endoscopy;  Laterality: N/A;  . HOT HEMOSTASIS N/A 10/10/2014   Procedure: HOT HEMOSTASIS (ARGON PLASMA COAGULATION/BICAP);  Surgeon: Gatha Mayer, MD;  Location: [facility];  Service: Endoscopy;  Laterality: N/A;  . LEFT AND RIGHT HEART CATHETERIZATION WITH CORONARY ANGIOGRAM  12/19/2011   Procedure: LEFT AND RIGHT HEART CATHETERIZATION WITH CORONARY ANGIOGRAM;  Surgeon: Leonie Man, MD;  Location: [facility] CATH LAB;  Service: Cardiovascular;;  . RIGHT AND LEFT CARDIAC CATHETERIZATION  July 2013   RAP: 22 mmHg, and RVP: 46/16 mmHg, RV EDP 18; L. BP 110/11 mmHg, LVEDP 20 mmHg;  PAP 45/29 mmHg (mean 37 mmHg); PCWP 29/22 mmHg - 26 mmHg; no significant coronary disease  . TEE WITHOUT CARDIOVERSION  03/24/2012   Procedure: TRANSESOPHAGEAL ECHOCARDIOGRAM (TEE);  Surgeon: Pixie Casino, MD;  Location: [facility] OR;  Service:  Cardiovascular;  Laterality: N/A;  TEE with Bubble  . TOTAL KNEE ARTHROPLASTY  1997   right  . UPPER GASTROINTESTINAL ENDOSCOPY  04/09/2006   w/Dilation, gastritis  . VIDEO BRONCHOSCOPY Bilateral 07/22/2016   Procedure: VIDEO BRONCHOSCOPY WITH FLUORO;  Surgeon: Juanito Doom, MD;  Location: [facility];  Service: Cardiopulmonary;  Laterality: Bilateral;     reports that she quit smoking about 22 years ago. Her smoking use included Cigarettes. She has a 60.00 pack-year smoking history. She has never used smokeless tobacco. She reports that she does not drink alcohol or use drugs.  Allergies  Allergen Reactions  . Aspirin Shortness Of Breath  . Nsaids Shortness Of Breath    Family History  Problem Relation Age of Onset  . Heart disease Mother   . Stroke Mother   . Heart disease Father   . Heart disease Brother   . Stroke Brother   . Skin cancer Brother   . Colon polyps Sister   . Diabetes Sister   . Diabetes Brother   . Irritable bowel syndrome Daughter   . Colon cancer Neg Hx     Prior to Admission medications   Medication Sig Start Date End Date Taking? Authorizing Provider  acetaminophen (TYLENOL) 500 MG tablet Take 1,000 mg by mouth every  6 (six) hours as needed for mild pain or headache.     [provider]  albuterol (PROVENTIL) (2.5 MG/3ML) 0.083% nebulizer solution Take 3 mLs (2.5 mg total) by nebulization every 6 (six) hours as needed for wheezing or shortness of breath. 06/09/16   Magdalen Spatz, NP  ALPRAZolam Duanne Moron) 0.25 MG tablet Take 0.125 mg by mouth at bedtime as needed for anxiety.    [provider]  atorvastatin (LIPITOR) 40 MG tablet Take 40 mg by mouth at bedtime.  06/06/13   [provider]  azithromycin (ZITHROMAX) 250 MG tablet Take 250 mg by mouth daily.    [provider]  benzonatate (TESSALON) 200 MG capsule Take 200 mg by mouth at bedtime.     [provider]  buPROPion (WELLBUTRIN XL) 300 MG 24 hr tablet  Take 300 mg by mouth daily. 03/03/16   [provider]  DULoxetine (CYMBALTA) 60 MG capsule Take 60 mg by mouth daily.     [provider]  furosemide (LASIX) 40 MG tablet Take 2 tablets (80 mg total) by mouth 2 (two) times daily. 01/16/17   Larey Dresser, MD  gabapentin (NEURONTIN) 600 MG tablet Take 600 mg by mouth 3 (three) times daily.     [provider]  Guaifenesin (MUCINEX MAXIMUM STRENGTH) 1200 MG TB12 Take 1,200 mg by mouth 2 (two) times daily as needed (cough / congestion).     [provider]  HYDROcodone-acetaminophen (NORCO/VICODIN) 5-325 MG per tablet Take 1 tablet by mouth every 4 (four) hours as needed for moderate pain. Reported on 12/12/2015    [provider]  insulin glargine (LANTUS) 100 UNIT/ML injection Inject 0.66 mLs (66 Units total) into the skin daily before breakfast. Patient taking differently: Inject 64 Units into the skin daily before breakfast.  01/24/16   Janece Canterbury, MD  insulin lispro (HUMALOG) 100 UNIT/ML injection Inject 6 Units into the skin See admin instructions. Inject 6 units subcutaneously twice daily (with breakfast and supper) if CBG >150    [provider]  irbesartan (AVAPRO) 150 MG tablet Take 1 tablet (150 mg total) by mouth daily. 12/31/16   Larey Dresser, MD  iron polysaccharides (NIFEREX) 150 MG capsule Take 1 capsule (150 mg total) by mouth 2 (two) times daily. 01/24/16   Janece Canterbury, MD  levalbuterol [facility] HFA) 45 MCG/ACT inhaler Inhale 1-2 puffs into the lungs every 4 (four) hours as needed for wheezing. Patient taking differently: Inhale 2 puffs into the lungs every 4 (four) hours as needed for wheezing or shortness of breath.  06/01/12   Marton Redwood, MD  metolazone (ZAROXOLYN) 5 MG tablet Take 5 mg by mouth 2 (two) times a week.    [provider]  Multiple Vitamin (MULTIVITAMIN WITH MINERALS) TABS tablet Take 1 tablet by mouth daily.    [provider]    omeprazole (PRILOSEC) 20 MG capsule Take 20 mg by mouth daily after supper.     [provider]  ONE TOUCH ULTRA TEST test strip 1 each by Other route 3 (three) times daily.  07/10/14   [provider]  OXYGEN Inhale 5 L into the lungs continuous.     [provider]  polyethylene glycol (MIRALAX / GLYCOLAX) packet Take 17 g by mouth daily. Mix in 8 oz liquid and drink    [provider]  polyvinyl alcohol (ARTIFICIAL TEARS) 1.4 % ophthalmic solution Place 1 drop into both eyes 2 (two) times daily.  [provider]  potassium chloride (K-DUR,KLOR-CON) 10 MEQ tablet Take 3 tablets (30 mEq total) by mouth 2 (two) times daily. 04/18/16   Larey Dresser, MD  rivaroxaban (XARELTO) 20 MG TABS tablet Take 1 tablet (20 mg total) by mouth daily with supper. Restart on Wednesday May 11 , 2016 10/10/14   Gatha Mayer, MD  roflumilast (DALIRESP) 500 MCG TABS tablet Take 500 mcg by mouth daily.    [provider]  sodium chloride HYPERTONIC 3 % nebulizer solution Take by nebulization 2 (two) times daily. 11/25/16   Juanito Doom, MD  SPIRIVA HANDIHALER 18 MCG inhalation capsule INHALE THE CONTENTS OF 1 CAPSULE VIA HANDIHALER BY MOUTH EVERY MORNING 11/03/16   Juanito Doom, MD  SYMBICORT 160-4.5 MCG/ACT inhaler INHALE 2 PUFFS BY MOUTH TWICE DAILY 01/27/17   Juanito Doom, MD  verapamil (VERELAN PM) 360 MG 24 hr capsule TAKE ONE CAPSULE BY MOUTH DAILY 12/08/16   Leonie Man, MD    Physical Exam:  Constitutional: Obese female who appears to be uncomfortable but in no acute distress at this time. Vitals:   01/29/17 0300 01/29/17 0309 01/29/17 0315 01/29/17 0330  BP: (!) 89/37 (!) 96/35 (!) 91/35 (!) 101/45  Pulse: (!) 112 (!) 109 (!) 108 (!) 108  Resp: 19 19 20 20   Temp:  98 F (36.7 C)    TempSrc:  Oral    SpO2: 100% 100% 100% 100%  Weight:      Height:       Eyes: PERRL, lids and conjunctivae normal ENMT: Mucous membranes are  moist. Posterior pharynx clear of any exudate or lesions.Nasal turbinate present with dry blood around nares. Neck: normal, supple, no masses, no thyromegaly Respiratory: Decreased overall aeration. Regular respiratory rate on nonrebreather mask. Cardiovascular: Tachycardic, no murmurs / rubs / gallops. No extremity edema. 2+ pedal pulses. No carotid bruits.  Abdomen: no tenderness, no masses palpated. No hepatosplenomegaly. Bowel sounds positive.  Musculoskeletal: no clubbing / cyanosis. No joint deformity upper and lower extremities. Good ROM, no contractures. Normal muscle tone.  Skin: no rashes, lesions, ulcers. No induration Neurologic: CN 2-12 grossly intact. Sensation intact, DTR normal. Strength 5/5 in all 4.  Psychiatric: Normal judgment and insight. Alert and oriented x 3. Normal mood.     Labs on Admission: I have personally reviewed following labs and imaging studies  CBC:  Recent Labs Lab 01/29/17 0037  WBC 14.4*  NEUTROABS 11.4*  HGB 7.8*  HCT 27.4*  MCV 78.5  PLT 947   Basic Metabolic Panel:  Recent Labs Lab 01/29/17 0037  NA 137  K 3.7  CL 97*  CO2 28  GLUCOSE 146*  BUN 20  CREATININE 1.28*  CALCIUM 8.8*   GFR: Estimated Creatinine Clearance: 47.4 mL/min (A) (by C-G formula based on SCr of 1.28 mg/dL (H)). Liver Function Tests: No results for input(s): AST, ALT, ALKPHOS, BILITOT, PROT, ALBUMIN in the last 168 hours. No results for input(s): LIPASE, AMYLASE in the last 168 hours. No results for input(s): AMMONIA in the last 168 hours. Coagulation Profile:  Recent Labs Lab 01/29/17 0037  INR 1.85   Cardiac Enzymes: No results for input(s): CKTOTAL, CKMB, CKMBINDEX, TROPONINI in the last 168 hours. BNP (last 3 results)  Recent Labs  05/19/16 1059 11/25/16 1518  PROBNP 47 42   HbA1C: No results for input(s): HGBA1C in the last 72 hours. CBG: No results for input(s): GLUCAP in the last 168 hours. Lipid Profile: No results for input(s):  CHOL, HDL, LDLCALC, TRIG, CHOLHDL, LDLDIRECT in the last 72 hours. Thyroid Function Tests: No results for input(s): TSH, T4TOTAL, FREET4, T3FREE, THYROIDAB in the last 72 hours. Anemia Panel: No results for input(s): VITAMINB12, FOLATE, FERRITIN, TIBC, IRON, RETICCTPCT in the last 72 hours. Urine analysis:    Component Value Date/Time   COLORURINE YELLOW 06/26/2016 1706   APPEARANCEUR CLEAR 06/26/2016 1706   LABSPEC 1.015 06/26/2016 1706   PHURINE 6.0 06/26/2016 1706   GLUCOSEU NEGATIVE 06/26/2016 1706   HGBUR NEGATIVE 06/26/2016 1706   BILIRUBINUR NEGATIVE 06/26/2016 1706   KETONESUR NEGATIVE 06/26/2016 1706   PROTEINUR NEGATIVE 05/25/2016 0205   UROBILINOGEN 0.2 06/26/2016 1706   NITRITE NEGATIVE 06/26/2016 1706   LEUKOCYTESUR NEGATIVE 06/26/2016 1706   Sepsis Labs: No results found for this or any previous visit (from the past 240 hour(s)).   Radiological Exams on Admission: Dg Chest Portable 1 View  Result Date: 01/29/2017 CLINICAL DATA:  Epistaxis.  Possible aspiration of blood. EXAM: PORTABLE CHEST 1 VIEW COMPARISON:  11/05/2016 FINDINGS: Patchy airspace opacities in the bases, left greater than right. This could represent pneumonia or aspiration. No large effusion. Normal pulmonary vasculature. Unchanged hilar and mediastinal contours. IMPRESSION: Left greater than right patchy airspace opacities. Cannot exclude pneumonia or aspiration. Electronically Signed   By: Andreas Newport M.D.   On: 01/29/2017 00:55    EKG: Independently reviewed. Sinus tachycardia 108 bpm  Assessment/Plan Sepsis 2/2 Community-acquired pneumonia: Acute. Patient presents tachycardic and tachypneic with complaints of productive cough with green sputum. WBC elevated at 14.4 with signs of left shift. Lactic acid was reassuring at 1.46. Chest x-ray showing signs of bilateral infiltrates. - Admit to stepdown bed  - Follow up blood and sputum cultures - Empiric antibiotics of Rocephin and azithromycin -  Check BNP  Nosebleed with acute blood loss anemia: Acute. Patient presents with complaints of intermittent nosebleeds.  Hemoglobin on admission 7.8, previously 11.8 in 4/18. Suspect acute trauma could be secondary to nosebleeds. - Continue transfusion of 1 unit of packed red blood cells - Monitor blood counts posttransfusion   Transient hypotension: Acute. Blood pressures noted to be as low as 72/62. Blood pressures improved after administration of blood. - Hold metolazone and Irbesartan due to hypotension - Continue verapamil, as tolerated  COPD/emphysema, chronic respiratory failure with hypoxia: Acute on chronic. Patient with slightly worsened breathing likely secondary factors as seen above. - Continuous pulse oximetry with NRB Mask for now - Continue roflumilast, hypertonic3%  nebs, and Spiriva - Change Symbicort to Brovana and Budesonide nebs - Albuterol nebs prn SOB/Wheezing  Diastolic Congestive heart failure: Last EF noted to be 55-60 with grade 1 diastolic dysfunction on 01/18/2992. - Strict intake and output and daily weights - Continue po furosemide, as tolerated - F/u BNP  Paroxysmal atrial fibrillation: Patient currently on Xarelto. Heart rates appear relatively controlled at this time. Chadsvasc = at least 5. - continue verapamil as tolerated  Diabetes mellitus type 2 - Hypoglycemic protocols  - CBGs every before meals and at bedtime with Moderate SSI - May want to restart home insulin regimen once medications reconciled  Renal insufficiency: Creatinine slightly elevated at 1.28 with BUN of 20. Previously patient's creatinine has been more within normal limits. Suspected acute loss of blood is possible causes symptoms. - Recheck BMP   Obesity  BMI 35.53   Hyperlipidemia - Continue Lipitor  GERD - Continue Protonix   DVT prophylaxis: Xarelto Code Status: Full Family Communication: Discussed plan of care with the patient family present  at bedside  Disposition  Plan: Likely discharge home in 2-3 days Consults called: none Admission status: Inpatient   Norval Morton MD [facility] Pager [phone]   If 7PM-7AM, please contact night-coverage [email or web address] Password [email or web address]  01/29/2017, 4:15 AM

## 2017-01-29 NOTE — ED Notes (Signed)
Dr. Hongalgi at bedside  

## 2017-01-29 NOTE — Progress Notes (Signed)
  Pharmacy Antibiotic Note  Destiny Shelton is a 71 y.o. female admitted on 01/29/2017 with bacteremia.  Pharmacy has been consulted for vancomycin dosing.  Microbiology lab reports GPC in chains on gram stain.  Plan: Vancomycin 750 mg IV every 12 hours.  Goal trough 15-20 mcg/mL.  F/u final culture results when available. F/u renal function and clinical course.  Height: 5\' 6"  (167.6 cm) Weight: 220 lb (99.8 kg) IBW/kg (Calculated) : 59.3  Temp (24hrs), Avg:98.4 F (36.9 C), Min:97.6 F (36.4 C), Max:99.7 F (37.6 C)   Recent Labs Lab 01/29/17 0037 01/29/17 0122 01/29/17 0544 01/29/17 1656  WBC 14.4*  --  12.1* 13.5*  CREATININE 1.28*  --   --   --   LATICACIDVEN  --  1.64  --   --     Estimated Creatinine Clearance: 47.4 mL/min (A) (by C-G formula based on SCr of 1.28 mg/dL (H)).    Allergies  Allergen Reactions  . Aspirin Shortness Of Breath  . Nsaids Shortness Of Breath    Antimicrobials this admission: Vanc 8/30 >>  Ceftriaxone 8/30>> Azithro 8/30 >>  Dose adjustments this admission:  Microbiology results: 1/2 BCx: GPC  UCx:    Sputum:    MRSA PCR:   Thank you for allowing pharmacy to be a part of this patient's care.  Uvaldo Rising, BCPS  Clinical Pharmacist Pager [phone]  01/29/2017 8:18 PM

## 2017-01-29 NOTE — ED Provider Notes (Signed)
TIME SEEN: 1:08 AM  CHIEF COMPLAINT: nosebleed, shortness of breath  HPI: Pt is a 71 year old female with history of COPD on home oxygen, diabetes, CHF, atrial fibrillation on Xarelto, hypertension, hyperlipidemia who presents to the emergency department with complaints of left-sided nosebleed that started 2 hours prior to arrival. She states that she blew her nose at home in started bleeding and could not get it to stop with pressure. EMS was able to get the bleeding to stop with packing with gauze and Afrin nasal spray. Patient did have to have her airway suction several times with EMS. Concerns for aspiration. She states she does feel more short of breath than normal. No chest pain or chest discomfort. She has had nosebleeds before but has been able to get them to stop at home. She has never required nasal packing, cauterization or ENT follow-up. No history of injury to the face or recent sinus or nasal surgery.  ROS: See HPI Constitutional: no fever  Eyes: no drainage  ENT: no runny nose   Cardiovascular:  no chest pain  Resp:  SOB  GI: no vomiting GU: no dysuria Integumentary: no rash  Allergy: no hives  Musculoskeletal: no leg swelling  Neurological: no slurred speech ROS otherwise negative  PAST MEDICAL HISTORY/PAST SURGICAL HISTORY:  Past Medical History:  Diagnosis Date  . Adenomatous colon polyp   . ALLERGIC RHINITIS   . Anemia    iron deficient  . Anxiety   . Atrial tachycardia (HCC)    Mostly Sinus Tachycardia  . Back pain, chronic   . Carotid stenosis   . CHF (congestive heart failure) ([place])   . Community acquired pneumonia - Recent Admission (07/2013) 07/08/2013  . COPD (chronic obstructive pulmonary disease) ([place])    Requiring Home O2 at 4L  . Depression   . Diabetes mellitus type 2 with neurological manifestations ([place])    On Insulin  . Diverticulosis of colon   . External hemorrhoids   . Fibromyalgia    "pain in arms and shoulders."  . Gastritis   . GERD  (gastroesophageal reflux disease)   . Headache(784.0)    tension  . Hyperlipemia   . Hypertension   . Inappropriate sinus tachycardia   . Morbidly obese ([place])   . Nephrolithiasis   . Neuromuscular disorder ([place])    diabetic neuropathy  . Osteoarthritis   . Osteoporosis   . PAF (paroxysmal atrial fibrillation) ([place])   . 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  . Sleep apnea    no cpap machine  02 at 4l/min all the time  . Urosepsis 05/26/2012    MEDICATIONS:  Prior to Admission medications   Medication Sig Start Date End Date Taking? Authorizing Provider  acetaminophen (TYLENOL) 500 MG tablet Take 1,000 mg by mouth every 6 (six) hours as needed for mild pain or headache.     [provider]  albuterol (PROVENTIL) (2.5 MG/3ML) 0.083% nebulizer solution Take 3 mLs (2.5 mg total) by nebulization every 6 (six) hours as needed for wheezing or shortness of breath. 06/09/16   Magdalen Spatz, NP  ALPRAZolam Duanne Moron) 0.25 MG tablet Take 0.125 mg by mouth at bedtime as needed for anxiety.    [provider]  atorvastatin (LIPITOR) 40 MG tablet Take 40 mg by mouth at bedtime.  06/06/13   [provider]  azithromycin (ZITHROMAX) 250 MG tablet Take 250 mg by mouth daily.    [provider]  benzonatate (TESSALON) 200 MG capsule Take 200 mg by mouth at bedtime.  [provider]  buPROPion (WELLBUTRIN XL) 300 MG 24 hr tablet Take 300 mg by mouth daily. 03/03/16   [provider]  DULoxetine (CYMBALTA) 60 MG capsule Take 60 mg by mouth daily.     [provider]  furosemide (LASIX) 40 MG tablet Take 2 tablets (80 mg total) by mouth 2 (two) times daily. 01/16/17   Larey Dresser, MD  gabapentin (NEURONTIN) 600 MG tablet Take 600 mg by mouth 3 (three) times daily.     [provider]  Guaifenesin (MUCINEX MAXIMUM STRENGTH) 1200 MG TB12 Take 1,200 mg by mouth 2 (two) times daily as needed (cough / congestion).     [provider]   HYDROcodone-acetaminophen (NORCO/VICODIN) 5-325 MG per tablet Take 1 tablet by mouth every 4 (four) hours as needed for moderate pain. Reported on 12/12/2015    [provider]  insulin glargine (LANTUS) 100 UNIT/ML injection Inject 0.66 mLs (66 Units total) into the skin daily before breakfast. Patient taking differently: Inject 64 Units into the skin daily before breakfast.  01/24/16   Janece Canterbury, MD  insulin lispro (HUMALOG) 100 UNIT/ML injection Inject 6 Units into the skin See admin instructions. Inject 6 units subcutaneously twice daily (with breakfast and supper) if CBG >150    [provider]  irbesartan (AVAPRO) 150 MG tablet Take 1 tablet (150 mg total) by mouth daily. 12/31/16   Larey Dresser, MD  iron polysaccharides (NIFEREX) 150 MG capsule Take 1 capsule (150 mg total) by mouth 2 (two) times daily. 01/24/16   Janece Canterbury, MD  levalbuterol [facility] HFA) 45 MCG/ACT inhaler Inhale 1-2 puffs into the lungs every 4 (four) hours as needed for wheezing. Patient taking differently: Inhale 2 puffs into the lungs every 4 (four) hours as needed for wheezing or shortness of breath.  06/01/12   Marton Redwood, MD  metolazone (ZAROXOLYN) 5 MG tablet Take 5 mg by mouth 2 (two) times a week.    [provider]  Multiple Vitamin (MULTIVITAMIN WITH MINERALS) TABS tablet Take 1 tablet by mouth daily.    [provider]  omeprazole (PRILOSEC) 20 MG capsule Take 20 mg by mouth daily after supper.     [provider]  ONE TOUCH ULTRA TEST test strip 1 each by Other route 3 (three) times daily.  07/10/14   [provider]  OXYGEN Inhale 5 L into the lungs continuous.     [provider]  polyethylene glycol (MIRALAX / GLYCOLAX) packet Take 17 g by mouth daily. Mix in 8 oz liquid and drink    [provider]  polyvinyl alcohol (ARTIFICIAL TEARS) 1.4 % ophthalmic solution Place 1 drop into both eyes 2 (two) times daily.     [provider]  potassium chloride (K-DUR,KLOR-CON) 10 MEQ tablet Take 3 tablets (30 mEq total) by mouth 2 (two) times daily. 04/18/16   Larey Dresser, MD  rivaroxaban (XARELTO) 20 MG TABS tablet Take 1 tablet (20 mg total) by mouth daily with supper. Restart on Wednesday May 11 , 2016 10/10/14   Gatha Mayer, MD  roflumilast (DALIRESP) 500 MCG TABS tablet Take 500 mcg by mouth daily.    [provider]  sodium chloride HYPERTONIC 3 % nebulizer solution Take by nebulization 2 (two) times daily. 11/25/16   Juanito Doom, MD  SPIRIVA HANDIHALER 18 MCG inhalation capsule INHALE THE CONTENTS OF 1 CAPSULE VIA HANDIHALER BY MOUTH EVERY MORNING 11/03/16   Juanito Doom, MD  SYMBICORT 160-4.5 MCG/ACT inhaler INHALE 2 PUFFS BY MOUTH TWICE DAILY 01/27/17   Juanito Doom, MD  verapamil (VERELAN PM) 360 MG 24 hr capsule TAKE ONE CAPSULE BY MOUTH DAILY 12/08/16   Leonie Man, MD    ALLERGIES:  Allergies  Allergen Reactions  . Aspirin Shortness Of Breath  . Nsaids Shortness Of Breath    SOCIAL HISTORY:  Social History  Substance Use Topics  . Smoking status: Former Smoker    Packs/day: 2.00    Years: 30.00    Types: Cigarettes    Quit date: 06/02/1994  . Smokeless tobacco: Never Used  . Alcohol use No    FAMILY HISTORY: Family History  Problem Relation Age of Onset  . Heart disease Mother   . Stroke Mother   . Heart disease Father   . Heart disease Brother   . Stroke Brother   . Skin cancer Brother   . Colon polyps Sister   . Diabetes Sister   . Diabetes Brother   . Irritable bowel syndrome Daughter   . Colon cancer Neg Hx     EXAM: Pulse (!) 111   Temp 98.4 F (36.9 C) (Oral)   Resp 18   Ht 5\' 6"  (1.676 m)   Wt 99.8 kg (220 lb)   SpO2 100%   BMI 35.51 kg/m  CONSTITUTIONAL: Alert and oriented and responds appropriately to questions. Elderly and chronically ill-appearing HEAD: Normocephalic EYES: Conjunctivae clear, pupils appear equal, EOMI ENT:  normal nose; moist mucous membranes, patient has packing to the left near and dried blood all over her face and inside of her mouth with no obvious sign of any active bleeding, normal phonation, no stridor, no trismus or drooling NECK: Supple, no meningismus, no nuchal rigidity, no LAD  CARD: irregularly irregular and tachycardic; S1 and S2 appreciated; no murmurs, no clicks, no rubs, no gallops RESP: Normal chest excursion without splinting; patient is tachypneic and not hypoxic on a nonrebreather, she has rhonchorous breath sounds diffusely mostly at her bases bilaterally with some scattered expiratory wheezes, she has no significant increased work of breathing and does not appear to be in any distress ABD/GI: Normal bowel sounds; non-distended; soft, non-tender, no rebound, no guarding, no peritoneal signs, no hepatosplenomegaly BACK:  The back appears normal and is non-tender to palpation, there is no CVA tenderness EXT: Normal ROM in all joints; non-tender to palpation; no edema; normal capillary refill; no cyanosis, no calf tenderness or swelling    SKIN: Normal color for age and race; warm; no rash NEURO: Moves all extremities equally PSYCH: The patient's mood and manner are appropriate. Grooming and personal hygiene are appropriate.  MEDICAL DECISION MAKING: Patient here with nosebleed and likely aspiration causing some rhonchorous breath sounds diffusely. Concern also for mild COPD exacerbation given she is wheezing. We'll obtain labs, chest x-ray. EKG shows no new ischemic abnormality. Bleeding appears to have stopped but we will monitor closely. EMS reports large amount of blood at the scene and she had been bleeding for over 2 hours. We will check her hemoglobin. This time she is hemodynamically stable.  ED PROGRESS: Patient has had a drop in her hemoglobin from 11.8-7.8. She does have slightly soft blood pressures. We'll give a unit of blood as well as IV fluids. Patient will need  admission. Her chest x-ray shows bilateral infiltrates worse on the left side concerning for pneumonia. We will give her ceftriaxone and azithromycin. Blood cultures pending. Lactate is normal. We'll discuss with hospitalist for  admission. Family at bedside and they are comfortable with this plan.    2:48 AM Discussed patient's case with hospitalist, Dr. Tamala Julian.  I have recommended admission and patient (and family if present) agree with this plan. Admitting physician will place admission orders.   I reviewed all nursing notes, vitals, pertinent previous records, EKGs, lab and urine results, imaging (as available).     EKG Interpretation  Date/Time:  Thursday January 29 2017 00:42:42 EDT Ventricular Rate:  108 PR Interval:    QRS Duration: 114 QT Interval:  346 QTC Calculation: 464 R Axis:   63 Text Interpretation:  Sinus tachycardia Incomplete right bundle branch block No significant change since last tracing Confirmed by Abdurrahman Petersheim, Cyril Mourning [phone]) on 01/29/2017 1:08:10 AM         CRITICAL CARE Performed by: Nyra Jabs   Total critical care time: 45 minutes  Critical care time was exclusive of separately billable procedures and treating other patients.  Critical care was necessary to treat or prevent imminent or life-threatening deterioration.  Critical care was time spent personally by me on the following activities: development of treatment plan with patient and/or surrogate as well as nursing, discussions with consultants, evaluation of patient's response to treatment, examination of patient, obtaining history from patient or surrogate, ordering and performing treatments and interventions, ordering and review of laboratory studies, ordering and review of radiographic studies, pulse oximetry and re-evaluation of patient's condition.    Marysue Fait, Delice Bison, DO 01/29/17 [phone]

## 2017-01-29 NOTE — ED Notes (Signed)
Dr Tamala Julian requesting med reconciliation for patient

## 2017-01-29 NOTE — Progress Notes (Signed)
PROGRESS NOTE   Destiny Shelton  ATF:[id number]    DOB: Dec 29, 1945    DOA: 01/29/2017  PCP: Marton Redwood, MD   I have briefly reviewed patients previous medical records in [facility].  Brief Narrative:  71 year old female with PMH of chronic diastolic CHF, CAD, chronic respiratory failure on home oxygen, PAF on Xarelto, type II DM, iron deficiency anemia, GERD, HLD, HTN, chronic intermittent epistaxis since being on Xarelto, presented to ED on 01/29/17 with another episode of nosebleed around 11 PM on night of admission that she was unable to control by applying external pressure or ice and few days' history of productive cough with green sputum, worsening wheezing, nasal congestion, ongoing chronic dyspnea. EMS was activated. In the ED, blood pressure 72/62, oxygen saturation 76% on home oxygen, nose was packed with resolution of epistaxis, briefly on nonrebreather mask. Admitted for presumed sepsis secondary to community-acquired pneumonia and epistaxis.   Assessment & Plan:   Principal Problem:   Sepsis ([place]) Active Problems:   Diabetes mellitus, type II, insulin dependent ([place])   Essential hypertension   PAF (paroxysmal atrial fibrillation) ([place])   CAP (community acquired pneumonia)   Bleeding from the nose   1. Presumed sepsis secondary to community-acquired pneumonia: She was tachycardic, tachypneic, hypoxic and hypotensive on arrival. She had productive cough. WBC 14.4. Lactate was normal. Chest x-ray showed left >right patchy airspace opacities. Treating empirically with IV ceftriaxone and azithromycin. 2. Epistaxis: In the context of Xarelto for PAF. Chronic, mild and intermittent. Had more epistaxis last night which she could not control by external pressure or ice pack. Briefly received no spike in ED and since then active bleeding has resolved. Spitting up some old blood. Monitor closely. If has recurrence or worsening of epistaxis, may have to completely stop Xarelto and  consider ENT consultation. Home oxygen may also be contributing to dry nose and epistaxis. She was counseled that she should not blow her nose or pick her nose. She verbalized understanding. 3. Acute blood loss anemia: Patient had hemoglobin of 11.8 on 4/30. She presented with hemoglobin of 7.8, likely multifactorial but some of it due to epistaxis. Transfused a unit of PRBCs and hemoglobin up to 8.1. Follow CBCs closely and transfuse if hemoglobin <8 g per DL. 4. Transient hypotension: Presented initially with BP of 72/60. Temporarily holding metolazone and ARB. Continue verapamil as blood pressure tolerates. Follow closely. 5. Acute on chronic respiratory failure with hypoxia/COPD: Mild clinical bronchospasm. Briefly on nonrebreather mask. Now transitioned to nasal cannula oxygen. Changed Symbicort to Brovana and Budesonide nebs. Continue home medications. When necessary albuterol for wheezing. Flutter valve. 6. Chronic diastolic CHF: LVEF 27-06 percent and grade 1 diastolic dysfunction by echo 07/17/15. Clinically euvolemic. Continue oral furosemide. See's Dr. Aundra Dubin, Cardiology. We will involve cardiology due to ongoing issues with epistaxis. 7. PAF: Continue home dose of verapamil. Also see discussion of Xarelto above. 8. Type II DM: Continue SSI. 9. Acute kidney injury: May be due to diuretics at home. Follow BMP closely. 10. Obesity/Body mass index is 35.51 kg/m. 11. Hyperlipidemia: Statins. 12. GERD: PPI. 13. Interstitial lung disease: Follows with Dr. Lake Bells, pulmonology.    DVT prophylaxis: Xarelto Code Status: Full Family Communication: None at bedside Disposition: DC home when medically improved.   Consultants:  None   Procedures:  None  Antimicrobials:  IV ceftriaxone and azithromycin    Subjective:  Seen this morning when she was still in the ED. Stated that she felt better. Most bleeding had stopped.  Coughing up mild old dried blood. States that breathing is also  better. Occasional wheezing. No chest pain, dyspnea or palpitations.  ROS:No dizziness, lightheadedness reported.  Objective:  Vitals:   01/29/17 1030 01/29/17 1045 01/29/17 1100 01/29/17 1149  BP: (!) 120/57  116/73 (!) 124/55  Pulse: (!) 120 (!) 114 (!) 111 (!) 111  Resp: 20 (!) 23 (!) 29 19  Temp:    98.2 F (36.8 C)  TempSrc:    Oral  SpO2: 100% 100% 100% 100%  Weight:      Height:        Examination:  General exam: Pleasant elderly female, moderately built and nourished, lying propped up in bed comfortably without distress. ENT: Dried blood in the nostrils but no ongoing active bleeding. Mild dried blood over posterior tongue and posterior pharyngeal wall without active bleeding or drainage. Respiratory system: slightly diminished breath sounds in the bases with occasional basal crackles. Scattered few right upper anterior chest rhonchi. Rest of lung fields clear to auscultation. Respiratory effort normal. Cardiovascular system: S1 & S2 heard, RRR. No JVD, murmurs, rubs, gallops or clicks. No pedal edema. Gastrointestinal system: Abdomen is nondistended, soft and nontender. No organomegaly or masses felt. Normal bowel sounds heard. Central nervous system: Alert and oriented. No focal neurological deficits. Extremities: Symmetric 5 x 5 power. Skin: No rashes, lesions or ulcers Psychiatry: Judgement and insight appear normal. Mood & affect appropriate.     Data Reviewed: I have personally reviewed following labs and imaging studies  CBC:  Recent Labs Lab 01/29/17 0037 01/29/17 0544  WBC 14.4* 12.1*  NEUTROABS 11.4*  --   HGB 7.8* 8.1*  HCT 27.4* 28.4*  MCV 78.5 79.3  PLT 333 811   Basic Metabolic Panel:  Recent Labs Lab 01/29/17 0037  NA 137  K 3.7  CL 97*  CO2 28  GLUCOSE 146*  BUN 20  CREATININE 1.28*  CALCIUM 8.8*   Liver Function Tests: No results for input(s): AST, ALT, ALKPHOS, BILITOT, PROT, ALBUMIN in the last 168 hours. Coagulation  Profile:  Recent Labs Lab 01/29/17 0037  INR 1.85   Cardiac Enzymes: No results for input(s): CKTOTAL, CKMB, CKMBINDEX, TROPONINI in the last 168 hours. HbA1C: No results for input(s): HGBA1C in the last 72 hours. CBG:  Recent Labs Lab 01/29/17 0911  GLUCAP 83    No results found for this or any previous visit (from the past 240 hour(s)).       Radiology Studies: Dg Chest Portable 1 View  Result Date: 01/29/2017 CLINICAL DATA:  Epistaxis.  Possible aspiration of blood. EXAM: PORTABLE CHEST 1 VIEW COMPARISON:  11/05/2016 FINDINGS: Patchy airspace opacities in the bases, left greater than right. This could represent pneumonia or aspiration. No large effusion. Normal pulmonary vasculature. Unchanged hilar and mediastinal contours. IMPRESSION: Left greater than right patchy airspace opacities. Cannot exclude pneumonia or aspiration. Electronically Signed   By: Andreas Newport M.D.   On: 01/29/2017 00:55        Scheduled Meds: . arformoterol  15 mcg Nebulization BID  . atorvastatin  40 mg Oral QHS  . budesonide (PULMICORT) nebulizer solution  0.5 mg Nebulization BID  . buPROPion  300 mg Oral Daily  . furosemide  80 mg Oral BID  . insulin aspart  0-15 Units Subcutaneous TID WC  . insulin aspart  0-5 Units Subcutaneous QHS  . iron polysaccharides  150 mg Oral BID  . pantoprazole  40 mg Oral Daily  . polyethylene glycol  17 g Oral  Daily  . polyvinyl alcohol  1 drop Both Eyes BID  . potassium chloride  30 mEq Oral BID  . rivaroxaban  20 mg Oral Q supper  . roflumilast  500 mcg Oral Daily  . sodium chloride HYPERTONIC  4 mL Nebulization BID  . tiotropium  18 mcg Inhalation Daily  . verapamil  360 mg Oral Daily   Continuous Infusions: . azithromycin    . cefTRIAXone (ROCEPHIN)  IV       LOS: 0 days     Aaniyah Strohm, MD, FACP, FHM. [facility] Pager [phone]  If 7PM-7AM, please contact night-coverage [email or web address] Password [email or web address] 01/29/2017, 12:53  PM

## 2017-01-29 NOTE — Consult Note (Signed)
Cardiology Consultation:   Patient ID: EMARY ZALAR; [id number]; 17-Feb-1946   Admit date: 01/29/2017 Date of Consult: 01/29/2017  Primary Care Provider: Marton Redwood, MD Primary Cardiologist: Dr. Loralie Champagne   Patient Profile:   Destiny Shelton is a 72 y.o. female with a hx of Paroxismal atrial fibrillation, chronic diastolic CHF with restrictive hemodynamics on 2013 RHC, COPD on home oxygen, inappropriate sinus tachycardia, iron deficiency anemia, GERD, HLD, HTN, chronic intermittent epistaxis since being on Xarelto and concern for intracardiac shunting who is being seen today for the evaluation of nose bleeds in the setting of anticoagulation at the request of Dr. Harvest Forest.  History of Present Illness:   Destiny Shelton has had intermittent epistaxis since being on Xarelto. She presented to the emergency department on 01/29/17 with another episode of nosebleed beginning at around 11 PM last night. She was unable to control the nosebleed by applying external pressure or ice. She's had a few days' history of productive cough with green sputum, worsening wheezing, nasal congestion and ongoing chronic dyspnea. In the ED her blood pressure was 72/62. Oxygen saturation was 76% on home oxygen. Her nose was packed with resolution of epistaxis. She is on nonrebreather mask. She has been admitted for presumed sepsis secondary to community-acquired pneumonia and epistaxis.  The patient's husband says that the patient has about 2 nosebleeds a week, most often these are very minor. Last night was the first time being this severe and uncontrolled. Apparently she has had a productive cough with green sputum and was coughing and blowing her nose when she developed this nosebleed. She denies increase in her usual dyspnea. She has chronic orthopnea. She currently has no edema. She no longer has bleeding from her nose but she feels like she has blood dripping down the back of her throat. She is trying not to  cough or dislodge any current clots.  The patient was last seen in the cardiology office on 12/30/16 by Dr. Aundra Dubin. Review of Dr. Claris Gladden note indicates that the patient had transcranial dopplers in 2013 suggesting a medium-sized right to left shunt. This led to an extensive workup.  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 Cardiac MRI was unremarkable, no evidence for shunt lesion.  RHC/LHC showed no coronary disease but it did show restrictive hemodynamics and volume overload.  Qp/Qs from this study was 1.27/1, suggesting a relatively small left to right shunt.  A definite shunt lesion was never identified.  Echo in 2/17 showed normal LV size and systolic function and normal RV.  A RHC was done in 2/17: on higher dose diuretics, she had mild pulmonary hypertension and normal PCWP.  There was no evidence for a shunt lesion with Qp/Qs 0.97.  On this day it was noted that the patient remained short of breath with mild exertion that has not changed with increase in her diuretics. Her dyspnea has gradually worsened over time.  Past Medical History:  Diagnosis Date  . Adenomatous colon polyp   . ALLERGIC RHINITIS   . Anemia    iron deficient  . Anxiety   . Atrial tachycardia (HCC)    Mostly Sinus Tachycardia  . Back pain, chronic   . Carotid stenosis   . CHF (congestive heart failure) ([place])   . Community acquired pneumonia - Recent Admission (07/2013) 07/08/2013  . COPD (chronic obstructive pulmonary disease) ([place])    Requiring Home O2 at 4L  . Depression   . Diabetes mellitus type 2 with neurological  manifestations ([place])    On Insulin  . Diverticulosis of colon   . External hemorrhoids   . Fibromyalgia    "pain in arms and shoulders."  . Gastritis   . GERD (gastroesophageal reflux disease)   . Headache(784.0)    tension  . Hyperlipemia   . Hypertension   . Inappropriate sinus tachycardia   . Morbidly obese ([place])   . Nephrolithiasis   . Neuromuscular disorder ([place])     diabetic neuropathy  . Osteoarthritis   . Osteoporosis   . PAF (paroxysmal atrial fibrillation) ([place])   . 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  . Sleep apnea    no cpap machine  02 at 4l/min all the time  . Urosepsis 05/26/2012    Past Surgical History:  Procedure Laterality Date  . ABDOMINAL HYSTERECTOMY  1978  . APPENDECTOMY  1963  . CARDIAC CATHETERIZATION N/A 07/04/2015   Procedure: Right Heart Cath;  Surgeon: Larey Dresser, MD;  Location: [facility];  Service: Cardiovascular;  Laterality: N/A;  . CHOLECYSTECTOMY  1963  . COLONOSCOPY N/A 12/21/2012   Procedure: COLONOSCOPY;  Surgeon: Gatha Mayer, MD;  Location: [facility];  Service: Endoscopy;  Laterality: N/A;  . COLONOSCOPY N/A 11/17/2013   Procedure: COLONOSCOPY;  Surgeon: Gatha Mayer, MD;  Location: [facility];  Service: Endoscopy;  Laterality: N/A;  . COLONOSCOPY W/ BIOPSIES AND POLYPECTOMY  07/22/2007, 04/28/2007   2009: diverticulosis, 2008: diverticulosis, adenomatous polyps  . COLONOSCOPY WITH PROPOFOL N/A 10/01/2012   Procedure: COLONOSCOPY WITH PROPOFOL;  Surgeon: Gatha Mayer, MD;  Location: [facility];  Service: Endoscopy;  Laterality: N/A;  . COLONOSCOPY WITH PROPOFOL N/A 10/10/2014   Procedure: COLONOSCOPY WITH PROPOFOL;  Surgeon: Gatha Mayer, MD;  Location: [facility];  Service: Endoscopy;  Laterality: N/A;  . ESOPHAGOGASTRODUODENOSCOPY (EGD) WITH PROPOFOL N/A 10/01/2012   Procedure: ESOPHAGOGASTRODUODENOSCOPY (EGD) WITH PROPOFOL;  Surgeon: Gatha Mayer, MD;  Location: [facility];  Service: Endoscopy;  Laterality: N/A;  . HOT HEMOSTASIS N/A 12/21/2012   Procedure: HOT HEMOSTASIS (ARGON PLASMA COAGULATION/BICAP);  Surgeon: Gatha Mayer, MD;  Location: [facility];  Service: Endoscopy;  Laterality: N/A;  . HOT HEMOSTASIS N/A 10/10/2014   Procedure: HOT HEMOSTASIS (ARGON PLASMA COAGULATION/BICAP);  Surgeon: Gatha Mayer, MD;  Location: [facility];  Service: Endoscopy;  Laterality: N/A;  . LEFT AND  RIGHT HEART CATHETERIZATION WITH CORONARY ANGIOGRAM  12/19/2011   Procedure: LEFT AND RIGHT HEART CATHETERIZATION WITH CORONARY ANGIOGRAM;  Surgeon: Leonie Man, MD;  Location: [facility] LAB;  Service: Cardiovascular;;  . RIGHT AND LEFT CARDIAC CATHETERIZATION  July 2013   RAP: 22 mmHg, and RVP: 46/16 mmHg, RV EDP 18; L. BP 110/11 mmHg, LVEDP 20 mmHg;  PAP 45/29 mmHg (mean 37 mmHg); PCWP 29/22 mmHg - 26 mmHg; no significant coronary disease  . TEE WITHOUT CARDIOVERSION  03/24/2012   Procedure: TRANSESOPHAGEAL ECHOCARDIOGRAM (TEE);  Surgeon: Pixie Casino, MD;  Location: [facility] OR;  Service: Cardiovascular;  Laterality: N/A;  TEE with Bubble  . TOTAL KNEE ARTHROPLASTY  1997   right  . UPPER GASTROINTESTINAL ENDOSCOPY  04/09/2006   w/Dilation, gastritis  . VIDEO BRONCHOSCOPY Bilateral 07/22/2016   Procedure: VIDEO BRONCHOSCOPY WITH FLUORO;  Surgeon: Juanito Doom, MD;  Location: [facility];  Service: Cardiopulmonary;  Laterality: Bilateral;     Home Medications:  Prior to Admission medications   Medication Sig Start Date End Date Taking? Authorizing Provider  acetaminophen (TYLENOL) 500 MG tablet Take 1,000 mg by mouth every 6 (six)  hours as needed for mild pain or headache.    Yes [provider]  atorvastatin (LIPITOR) 40 MG tablet Take 40 mg by mouth at bedtime.  06/06/13  Yes [provider]  azithromycin (ZITHROMAX) 250 MG tablet Take 250 mg by mouth daily.   Yes [provider]  benzonatate (TESSALON) 200 MG capsule Take 200 mg by mouth at bedtime.    Yes [provider]  buPROPion (WELLBUTRIN XL) 300 MG 24 hr tablet Take 300 mg by mouth daily. 03/03/16  Yes [provider]  DULoxetine (CYMBALTA) 60 MG capsule Take 60 mg by mouth daily.    Yes [provider]  furosemide (LASIX) 40 MG tablet Take 2 tablets (80 mg total) by mouth 2 (two) times daily. 01/16/17  Yes Larey Dresser, MD  gabapentin (NEURONTIN) 600 MG tablet Take 600 mg by  mouth 3 (three) times daily.    Yes [provider]  Guaifenesin (MUCINEX MAXIMUM STRENGTH) 1200 MG TB12 Take 1,200 mg by mouth 2 (two) times daily as needed (cough / congestion).    Yes [provider]  HYDROcodone-acetaminophen (NORCO/VICODIN) 5-325 MG per tablet Take 1 tablet by mouth every 4 (four) hours as needed for moderate pain. Reported on 12/12/2015   Yes [provider]  insulin glargine (LANTUS) 100 UNIT/ML injection Inject 0.66 mLs (66 Units total) into the skin daily before breakfast. Patient taking differently: Inject 64 Units into the skin daily before breakfast.  01/24/16  Yes Short, Noah Delaine, MD  insulin lispro (HUMALOG) 100 UNIT/ML injection Inject 6 Units into the skin See admin instructions. Inject 6 units subcutaneously twice daily (with breakfast and supper) if CBG >150   Yes [provider]  irbesartan (AVAPRO) 150 MG tablet Take 1 tablet (150 mg total) by mouth daily. 12/31/16  Yes Larey Dresser, MD  iron polysaccharides (NIFEREX) 150 MG capsule Take 1 capsule (150 mg total) by mouth 2 (two) times daily. 01/24/16  Yes Short, Noah Delaine, MD  levalbuterol [facility] HFA) 45 MCG/ACT inhaler Inhale 1-2 puffs into the lungs every 4 (four) hours as needed for wheezing. Patient taking differently: Inhale 2 puffs into the lungs every 4 (four) hours as needed for wheezing or shortness of breath.  06/01/12  Yes Marton Redwood, MD  metolazone (ZAROXOLYN) 5 MG tablet Take 5 mg by mouth 2 (two) times a week.   Yes [provider]  Multiple Vitamin (MULTIVITAMIN WITH MINERALS) TABS tablet Take 1 tablet by mouth daily.   Yes [provider]  omeprazole (PRILOSEC) 20 MG capsule Take 20 mg by mouth daily after supper.    Yes [provider]  OXYGEN Inhale 5-8 L into the lungs See admin instructions. Use 5L when sitting and 8 L when up and moving   Yes [provider]  polyethylene glycol (MIRALAX / GLYCOLAX) packet Take 17 g by  mouth daily. Mix in 8 oz liquid and drink   Yes [provider]  polyvinyl alcohol (ARTIFICIAL TEARS) 1.4 % ophthalmic solution Place 1 drop into both eyes 2 (two) times daily.    Yes [provider]  potassium chloride (K-DUR,KLOR-CON) 10 MEQ tablet Take 3 tablets (30 mEq total) by mouth 2 (two) times daily. 04/18/16  Yes Larey Dresser, MD  rivaroxaban (XARELTO) 20 MG TABS tablet Take 1 tablet (20 mg total) by mouth daily with supper. Restart on Wednesday May 11 , 2016 10/10/14  Yes Gatha Mayer, MD  roflumilast (DALIRESP) 500 MCG TABS tablet Take 500  mcg by mouth daily.   Yes [provider]  sodium chloride HYPERTONIC 3 % nebulizer solution Take by nebulization 2 (two) times daily. Patient taking differently: Take 4 mLs by nebulization 2 (two) times daily.  11/25/16  Yes Juanito Doom, MD  SPIRIVA HANDIHALER 18 MCG inhalation capsule INHALE THE CONTENTS OF 1 CAPSULE VIA HANDIHALER BY MOUTH EVERY MORNING 11/03/16  Yes Juanito Doom, MD  SYMBICORT 160-4.5 MCG/ACT inhaler INHALE 2 PUFFS BY MOUTH TWICE DAILY 01/27/17  Yes Juanito Doom, MD  verapamil (VERELAN PM) 360 MG 24 hr capsule TAKE ONE CAPSULE BY MOUTH DAILY Patient taking differently: TAKE 360mg  BY MOUTH once DAILY 12/08/16  Yes Leonie Man, MD  albuterol (PROVENTIL) (2.5 MG/3ML) 0.083% nebulizer solution Take 3 mLs (2.5 mg total) by nebulization every 6 (six) hours as needed for wheezing or shortness of breath. 06/09/16   Magdalen Spatz, NP  ALPRAZolam Duanne Moron) 0.25 MG tablet Take 0.25 mg by mouth at bedtime as needed for anxiety.     [provider]    Inpatient Medications: Scheduled Meds: . arformoterol  15 mcg Nebulization BID  . atorvastatin  40 mg Oral QHS  . budesonide (PULMICORT) nebulizer solution  0.5 mg Nebulization BID  . buPROPion  300 mg Oral Daily  . furosemide  80 mg Oral BID  . insulin aspart  0-15 Units Subcutaneous TID WC  . insulin aspart  0-5 Units Subcutaneous  QHS  . iron polysaccharides  150 mg Oral BID  . pantoprazole  40 mg Oral Daily  . polyethylene glycol  17 g Oral Daily  . polyvinyl alcohol  1 drop Both Eyes BID  . potassium chloride  30 mEq Oral BID  . rivaroxaban  20 mg Oral Q supper  . roflumilast  500 mcg Oral Daily  . sodium chloride HYPERTONIC  4 mL Nebulization BID  . tiotropium  18 mcg Inhalation Daily  . verapamil  360 mg Oral Daily   Continuous Infusions: . azithromycin    . cefTRIAXone (ROCEPHIN)  IV     PRN Meds: acetaminophen **OR** acetaminophen, albuterol, ALPRAZolam, benzonatate, guaiFENesin, ondansetron **OR** ondansetron (ZOFRAN) IV  Allergies:    Allergies  Allergen Reactions  . Aspirin Shortness Of Breath  . Nsaids Shortness Of Breath    Social History:   Social History   Social History  . Marital status: Married    Spouse name: N/A  . Number of children: 2  . Years of education: HS   Occupational History  . Disability    Social History Main Topics  . Smoking status: Former Smoker    Packs/day: 2.00    Years: 30.00    Types: Cigarettes    Quit date: 06/02/1994  . Smokeless tobacco: Never Used  . Alcohol use No  . Drug use: No  . Sexual activity: No   Other Topics Concern  . Not on file   Social History Narrative   She is a married mother of 2, grandmother of 6, great-grandmother of 2. She is with her husband here today. She does not really exercise as she is on home oxygen and has baseline dyspnea.    Does not smoke. Does not drink. She quit smoking somewhere between 10-15 years ago. She is now trying to work on picking up her activity level. She is trying to I think more than likely get back into pulmonary rehabilitation potentially.       Daily caffeine.     Family History:  Family History  Problem Relation Age of Onset  . Heart disease Mother   . Stroke Mother   . Heart disease Father   . Heart disease Brother   . Stroke Brother   . Skin cancer Brother   . Colon polyps Sister    . Diabetes Sister   . Diabetes Brother   . Irritable bowel syndrome Daughter   . Colon cancer Neg Hx      ROS:  Please see the history of present illness.  ROS  All other ROS reviewed and negative.     Physical Exam/Data:   Vitals:   01/29/17 1030 01/29/17 1045 01/29/17 1100 01/29/17 1149  BP: (!) 120/57  116/73 (!) 124/55  Pulse: (!) 120 (!) 114 (!) 111 (!) 111  Resp: 20 (!) 23 (!) 29 19  Temp:    98.2 F (36.8 C)  TempSrc:    Oral  SpO2: 100% 100% 100% 100%  Weight:      Height:        Intake/Output Summary (Last 24 hours) at 01/29/17 1450 Last data filed at 01/29/17 1105  Gross per 24 hour  Intake              365 ml  Output              310 ml  Net               55 ml   Filed Weights   01/29/17 0040  Weight: 220 lb (99.8 kg)   Body mass index is 35.51 kg/m.  General:  Chronically ill-appearing elderly female, in no acute distress HEENT: normal Lymph: no adenopathy Neck: no JVD Endocrine:  No thryomegaly Vascular: No carotid bruits; FA pulses 2+ bilaterally without bruits  Cardiac:  normal S1, S2; regular rhythm, tachycardic; no murmur  Lungs:  Faint expiratory wheezing, scattered rhonchi, scattered scratchy crackles Abd: soft, nontender, no hepatomegaly  Ext: no edema Musculoskeletal:  No deformities, BUE and BLE strength normal and equal Skin: warm and dry  Neuro:  CNs 2-12 intact, no focal abnormalities noted Psych:  Normal affect   EKG:  The EKG was personally reviewed and demonstrates:  Sinus tachycardia at 108 bpm, Incomplete RBBB Telemetry:  Telemetry was personally reviewed and demonstrates:  Sinus tachycardia in the 110s  Relevant CV Studies:  Echocardiogram 07/17/15 Study Conclusions - Left ventricle: The cavity size was normal. Wall thickness was   increased in a pattern of mild LVH. Systolic function was normal.   The estimated ejection fraction was in the range of 55% to 60%.   Wall motion was normal; there were no regional wall  motion   abnormalities. Doppler parameters are consistent with abnormal   left ventricular relaxation (grade 1 diastolic dysfunction). - Mitral valve: There was mild regurgitation. - Left atrium: The atrium was mildly dilated.  Impressions: - Normal LV systolic function; grade 1 diastolic dysfunction; mild   MR; mild LAE.  Right Heart Cath 07/04/15  Conclusion  1. Near-normal PCWP.  Mildly elevated RA pressure.   2. Mild pulmonary hypertension with PVR only 2.5 WU.  I suspect underlying lung disease/hypoxemia plays a large role here.  3. Normal cardiac output.  4. No evidence for a significant shunt lesion with Qp/Qs 0.97.      Laboratory Data:  Chemistry Recent Labs Lab 01/29/17 0037  NA 137  K 3.7  CL 97*  CO2 28  GLUCOSE 146*  BUN 20  CREATININE 1.28*  CALCIUM 8.8*  GFRNONAA  41*  GFRAA 47*  ANIONGAP 12    No results for input(s): PROT, ALBUMIN, AST, ALT, ALKPHOS, BILITOT in the last 168 hours. Hematology Recent Labs Lab 01/29/17 0037 01/29/17 0544  WBC 14.4* 12.1*  RBC 3.49* 3.58*  HGB 7.8* 8.1*  HCT 27.4* 28.4*  MCV 78.5 79.3  MCH 22.3* 22.6*  MCHC 28.5* 28.5*  RDW 16.2* 16.0*  PLT 333 289   Cardiac EnzymesNo results for input(s): TROPONINI in the last 168 hours. No results for input(s): TROPIPOC in the last 168 hours.  BNP Recent Labs Lab 01/29/17 0544  BNP 21.2    DDimer No results for input(s): DDIMER in the last 168 hours.  Radiology/Studies:  Dg Chest Portable 1 View  Result Date: 01/29/2017 CLINICAL DATA:  Epistaxis.  Possible aspiration of blood. EXAM: PORTABLE CHEST 1 VIEW COMPARISON:  11/05/2016 FINDINGS: Patchy airspace opacities in the bases, left greater than right. This could represent pneumonia or aspiration. No large effusion. Normal pulmonary vasculature. Unchanged hilar and mediastinal contours. IMPRESSION: Left greater than right patchy airspace opacities. Cannot exclude pneumonia or aspiration. Electronically Signed   By: Andreas Newport M.D.   On: 01/29/2017 00:55    Assessment and Plan:   Epistaxis -Patient is anticoagulated for stroke risk reduction related to atrial fibrillation -She has had repeated nosebleeds since being on Xarelto, usually very minor. She had a nosebleed beginning at around 11 PM last night she was unable to control with external pressure and ice. She had nasal packing in the ED with control of the bleeding. She has had productive cough and nasal congestion and developed a nosebleed during coughing and blowing her nose. She is being treated for community-acquired pneumonia which may be contributing to her easy bleeding of the mucosa. -Home oxygen may be contributing to this issue. She uses 5 L at rest and 8 L during activity. -Hemoglobin is down to 7.8 and 8.1 most recently. Down from 11.8 in April/2018. The patient has a history of iron deficiency anemia in the past done in the 8 range in 2013 in 2015 and 2017. -Will discuss further continuation of anticoagulation with Dr. Sallyanne Kuster.  Paroxysmal Atrial fibrillation -CHA2DS2/VAS Stroke Risk Score is 5 (CHF, HTN, Age, DM, female). The patient is anticoagulated with Xarelto. -She is treated with verapamil -Patient is maintaining sinus rhythm  Sepsis -Resumed sepsis secondary to community-acquired pneumonia. The patient was tachycardic, tachypnea, hypoxic and hypotensive on arrival to the hospital. She had a productive cough. WBC was 14.4. Lactic acid normal. Chest x-ray showed left greater than right patchy airspace opacities.  -She is being treated empirically with IV ceftriaxone and azithromycin  Chronic diastolic CHF -NYHA class III symptoms, likely due to combination of CHF and COPD/ILD -EF 55-60% by echo in 07/2015 with grade 1 diastolic dysfunction -Current home diuresis is Lasix 80 mg twice a day with metolazone twice a week -BNP is 21.2 -The patient currently has no increase in her usual dyspnea. She has chronic orthopnea. -Serum  creatinine is 1.28 today. -Continue current diuresis and watch kidney function and electrolytes  Inappropriate sinus tachycardia -Current heart rates are in the 110s -Corlanor was considered in the past however this medication interacts with verapamil  Pulmonary hypertension -Mild pulmonary hypertension on right heart cath probably due to COPD/ILD and elevated left atrial pressure. No specific treatment other than diuresis  Interstitial lung disease -Possibly hypersensitivity pneumonitis. Followed by Dr. Lake Bells. Dyspnea gradually worsening over time -patient uses supplemental oxygen at home 5 L at rest and  8 L during activity. Currently she is on non-rebreather.       Signed, Daune Perch, NP  01/29/2017 2:50 PM  I have seen and examined the patient along with Daune Perch, NP .  I have reviewed the chart, notes and new data.  I agree with PA/NP's note.  Key new complaints: currently no ongoing epistaxis or other bleeding Key examination changes: sleepy, but easily awoken and oriented. Pale. Key new findings / data: sinus tachycardia. Hgb 7.8-8.1, microcytic.  PLAN: My impression is that she has Fe deficiency anemia, likely due to recurrent/chronic epistaxis, rather than acute exsanguination. She reports that the bleeding happens almost always from the left nare. She may have a local, treatable source of bleeding and I would refer for ENT evaluation. Make sure home O2 is humidified. Consider referral for a WATCHMAN device. Local program on hold, can refer to Dr. Bridgette Habermann at [facility]. Would still need to take warfarin for a month, then clopidogrel for 6 months. Fe supplements. Hold Xarelto for now.   Sanda Klein, MD, [place] [phone] 01/29/2017, 4:56 PM

## 2017-01-30 ENCOUNTER — Inpatient Hospital Stay (HOSPITAL_COMMUNITY): Payer: Medicare Other

## 2017-01-30 DIAGNOSIS — Z87891 Personal history of nicotine dependence: Secondary | ICD-10-CM

## 2017-01-30 DIAGNOSIS — Z833 Family history of diabetes mellitus: Secondary | ICD-10-CM

## 2017-01-30 DIAGNOSIS — J449 Chronic obstructive pulmonary disease, unspecified: Secondary | ICD-10-CM

## 2017-01-30 DIAGNOSIS — R7881 Bacteremia: Secondary | ICD-10-CM

## 2017-01-30 DIAGNOSIS — Z8371 Family history of colonic polyps: Secondary | ICD-10-CM

## 2017-01-30 DIAGNOSIS — Z823 Family history of stroke: Secondary | ICD-10-CM

## 2017-01-30 DIAGNOSIS — I4891 Unspecified atrial fibrillation: Secondary | ICD-10-CM

## 2017-01-30 DIAGNOSIS — J209 Acute bronchitis, unspecified: Secondary | ICD-10-CM

## 2017-01-30 DIAGNOSIS — I509 Heart failure, unspecified: Secondary | ICD-10-CM

## 2017-01-30 DIAGNOSIS — Z8379 Family history of other diseases of the digestive system: Secondary | ICD-10-CM

## 2017-01-30 DIAGNOSIS — Z808 Family history of malignant neoplasm of other organs or systems: Secondary | ICD-10-CM

## 2017-01-30 DIAGNOSIS — Z886 Allergy status to analgesic agent status: Secondary | ICD-10-CM

## 2017-01-30 DIAGNOSIS — Z8249 Family history of ischemic heart disease and other diseases of the circulatory system: Secondary | ICD-10-CM

## 2017-01-30 DIAGNOSIS — B952 Enterococcus as the cause of diseases classified elsewhere: Secondary | ICD-10-CM | POA: Diagnosis present

## 2017-01-30 DIAGNOSIS — E119 Type 2 diabetes mellitus without complications: Secondary | ICD-10-CM

## 2017-01-30 LAB — BASIC METABOLIC PANEL
Anion gap: 11 (ref 5–15)
BUN: 8 mg/dL (ref 6–20)
CHLORIDE: 91 mmol/L — AB (ref 101–111)
CO2: 36 mmol/L — AB (ref 22–32)
CREATININE: 0.76 mg/dL (ref 0.44–1.00)
Calcium: 9 mg/dL (ref 8.9–10.3)
GFR calc Af Amer: >60 mL/min (ref >60)
GFR calc non Af Amer: >60 mL/min (ref >60)
GLUCOSE: 137 mg/dL — AB (ref 65–99)
POTASSIUM: 2.8 mmol/L — AB (ref 3.5–5.1)
Sodium: 138 mmol/L (ref 135–145)

## 2017-01-30 LAB — CBC
HEMATOCRIT: 31.6 % — AB (ref 36.0–46.0)
Hemoglobin: 9.3 g/dL — ABNORMAL LOW (ref 12.0–15.0)
MCH: 23.1 pg — AB (ref 26.0–34.0)
MCHC: 29.4 g/dL — AB (ref 30.0–36.0)
MCV: 78.6 fL (ref 78.0–100.0)
PLATELETS: 302 10*3/uL (ref 150–400)
RBC: 4.02 MIL/uL (ref 3.87–5.11)
RDW: 15.9 % — AB (ref 11.5–15.5)
WBC: 11.4 10*3/uL — ABNORMAL HIGH (ref 4.0–10.5)

## 2017-01-30 LAB — BLOOD GAS, ARTERIAL
ACID-BASE EXCESS: 14.5 mmol/L — AB (ref 0.0–2.0)
Bicarbonate: 39.4 mmol/L — ABNORMAL HIGH (ref 20.0–28.0)
DRAWN BY: 275531
FIO2: 100
O2 SAT: 98.9 %
PATIENT TEMPERATURE: 98.6
PCO2 ART: 56.3 mmHg — AB (ref 32.0–48.0)
pH, Arterial: 7.459 — ABNORMAL HIGH (ref 7.350–7.450)
pO2, Arterial: 137 mmHg — ABNORMAL HIGH (ref 83.0–108.0)

## 2017-01-30 LAB — TYPE AND SCREEN
ABO/RH(D): A POS
ANTIBODY SCREEN: NEGATIVE
Unit division: 0

## 2017-01-30 LAB — BPAM RBC
BLOOD PRODUCT EXPIRATION DATE: 201809202359
ISSUE DATE / TIME: 201808300234
UNIT TYPE AND RH: 6200

## 2017-01-30 LAB — GLUCOSE, CAPILLARY
GLUCOSE-CAPILLARY: 134 mg/dL — AB (ref 65–99)
Glucose-Capillary: 138 mg/dL — ABNORMAL HIGH (ref 65–99)
Glucose-Capillary: 161 mg/dL — ABNORMAL HIGH (ref 65–99)
Glucose-Capillary: 139 mg/dL — ABNORMAL HIGH (ref 65–99)

## 2017-01-30 LAB — LEGIONELLA PNEUMOPHILA SEROGP 1 UR AG: L. pneumophila Serogp 1 Ur Ag: NEGATIVE

## 2017-01-30 MED ORDER — ALUM & MAG HYDROXIDE-SIMETH 200-200-20 MG/5ML PO SUSP
30.0000 mL | Freq: Four times a day (QID) | ORAL | Status: DC | PRN
  Administered 2017-01-30: 30 mL via ORAL
  Filled 2017-01-30: qty 30

## 2017-01-30 MED ORDER — IPRATROPIUM-ALBUTEROL 0.5-2.5 (3) MG/3ML IN SOLN
3.0000 mL | Freq: Four times a day (QID) | RESPIRATORY_TRACT | Status: DC
  Administered 2017-01-30 – 2017-01-31 (×6): 3 mL via RESPIRATORY_TRACT
  Filled 2017-01-30 (×6): qty 3

## 2017-01-30 MED ORDER — AMOXICILLIN-POT CLAVULANATE 875-125 MG PO TABS
1.0000 | ORAL_TABLET | Freq: Two times a day (BID) | ORAL | Status: DC
  Administered 2017-01-30 (×2): 1 via ORAL
  Filled 2017-01-30 (×3): qty 1

## 2017-01-30 MED ORDER — IPRATROPIUM-ALBUTEROL 0.5-2.5 (3) MG/3ML IN SOLN
3.0000 mL | RESPIRATORY_TRACT | Status: DC | PRN
  Administered 2017-02-01: 3 mL via RESPIRATORY_TRACT
  Filled 2017-01-30: qty 3

## 2017-01-30 NOTE — Consult Note (Addendum)
[facility]    Date of Admission:  01/29/2017   Day 2 vancomycin        Day 2 ceftriaxone        Day 2 azithromycin       Reason for Consult: Automatic consultation for enterococcal bacteremia     Assessment: [name] came to the hospital because of epistaxis. She has not had any fever and I am not terribly impressed with any acute chest x-ray findings. It was read as patchy airspace opacities left greater than right but it looks identical (to my eyes) to her last chest x-ray in June. I am not convinced that she has pneumonia or was actually septic. The finding of one blood culture growing enterococcus may be a simple contaminant. She is eager to go home and looks quite stable. I recommend changing her to amoxicillin clavulanate and treating for 3 more days for acute bronchitis.  Plan: 1. Narrow therapy to amoxicillin clavulanate  2. Please call if I can be of further assistance  Principal Problem:   Enterococcal bacteremia Active Problems:   CAP (community acquired pneumonia)   Bleeding from the nose   Diabetes mellitus, type II, insulin dependent ([place])   Essential hypertension   PAF (paroxysmal atrial fibrillation) ([place])   Sepsis ([place])   . arformoterol  15 mcg Nebulization BID  . atorvastatin  40 mg Oral QHS  . budesonide (PULMICORT) nebulizer solution  0.5 mg Nebulization BID  . buPROPion  300 mg Oral Daily  . chlorhexidine  15 mL Mouth Rinse BID  . insulin aspart  0-15 Units Subcutaneous TID WC  . insulin aspart  0-5 Units Subcutaneous QHS  . ipratropium-albuterol  3 mL Nebulization Q6H  . iron polysaccharides  150 mg Oral BID  . mouth rinse  15 mL Mouth Rinse q12n4p  . pantoprazole  40 mg Oral Daily  . polyethylene glycol  17 g Oral Daily  . polyvinyl alcohol  1 drop Both Eyes BID  . potassium chloride  30 mEq Oral BID  . roflumilast  500 mcg Oral Daily  . sodium chloride HYPERTONIC  4 mL Nebulization BID  . verapamil  360 mg  Oral Daily    HPI: Destiny Shelton is a 71 y.o. female with heart failure, atrial fibrillation, COPD and diabetes. She came to the emergency department 2 days ago because of recurrent epistaxis that has been occurring since she was placed on rivaroxaban. She was afebrile but was slightly tachycardic, tachypneic and hypotensive. She mentioned that she had been coughing up green sputum for the past week. She denies being more short of breath than normal. She was not worried about the possibility of acute bronchitis or pneumonia. She only came to the emergency department because of the nosebleed. A chest x-ray was read as showing possible patchy lower lobe infiltrates. She was started on empiric therapy for community-acquired pneumonia. One of 2 admission blood cultures has grown enterococcus. She wants to know when she can go home.   Review of Systems: Review of Systems  Constitutional: Negative for chills, diaphoresis, fever, malaise/fatigue and weight loss.  HENT: Positive for nosebleeds.   Respiratory: Positive for cough and sputum production. Negative for shortness of breath and wheezing.   Cardiovascular: Negative for chest pain.  Gastrointestinal: Negative for abdominal pain, diarrhea, nausea and vomiting.    Past Medical History:  Diagnosis Date  . Adenomatous colon polyp   . ALLERGIC RHINITIS   .  Anemia    iron deficient  . Anxiety   . Atrial tachycardia (HCC)    Mostly Sinus Tachycardia  . Back pain, chronic   . Carotid stenosis   . CHF (congestive heart failure) ([place])   . Community acquired pneumonia - Recent Admission (07/2013) 07/08/2013  . COPD (chronic obstructive pulmonary disease) ([place])    Requiring Home O2 at 4L  . Depression   . Diabetes mellitus type 2 with neurological manifestations ([place])    On Insulin  . Diverticulosis of colon   . External hemorrhoids   . Fibromyalgia    "pain in arms and shoulders."  . Gastritis   . GERD (gastroesophageal reflux disease)   .  Headache(784.0)    tension  . Hyperlipemia   . Hypertension   . Inappropriate sinus tachycardia   . Morbidly obese ([place])   . Nephrolithiasis   . Neuromuscular disorder ([place])    diabetic neuropathy  . Osteoarthritis   . Osteoporosis   . PAF (paroxysmal atrial fibrillation) ([place])   . 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  . Sleep apnea    no cpap machine  02 at 4l/min all the time  . Urosepsis 05/26/2012    Social History  Substance Use Topics  . Smoking status: Former Smoker    Packs/day: 2.00    Years: 30.00    Types: Cigarettes    Quit date: 06/02/1994  . Smokeless tobacco: Never Used  . Alcohol use No    Family History  Problem Relation Age of Onset  . Heart disease Mother   . Stroke Mother   . Heart disease Father   . Heart disease Brother   . Stroke Brother   . Skin cancer Brother   . Colon polyps Sister   . Diabetes Sister   . Diabetes Brother   . Irritable bowel syndrome Daughter   . Colon cancer Neg Hx    Allergies  Allergen Reactions  . Aspirin Shortness Of Breath  . Nsaids Shortness Of Breath    OBJECTIVE: Blood pressure 106/78, pulse (!) 112, temperature 98.5 F (36.9 C), temperature source Axillary, resp. rate 20, height 5\' 6"  (1.676 m), weight 208 lb 9.6 oz (94.6 kg), SpO2 99 %.  Physical Exam  Constitutional: She is oriented to person, place, and time.  She is sitting up in bed watching television with her husband. She is in good spirits.  Cardiovascular: Normal rate.   No murmur heard. Irregular rhythm.  Pulmonary/Chest: Effort normal. She has no wheezes. She has rales.  A few crackles in left base posteriorly.  Abdominal: Soft. There is no tenderness.  Neurological: She is alert and oriented to person, place, and time.  Skin: No rash noted.  Psychiatric: Mood and affect normal.    Lab Results Lab Results  Component Value Date   WBC 11.4 (H) 01/30/2017   HGB 9.3 (L) 01/30/2017   HCT 31.6 (L) 01/30/2017   MCV 78.6 01/30/2017   PLT 302 01/30/2017     Lab Results  Component Value Date   CREATININE 0.76 01/30/2017   BUN 8 01/30/2017   NA 138 01/30/2017   K 2.8 (L) 01/30/2017   CL 91 (L) 01/30/2017   CO2 36 (H) 01/30/2017    Lab Results  Component Value Date   ALT 14 11/05/2016   AST 22 11/05/2016   ALKPHOS 62 11/05/2016   BILITOT 0.4 11/05/2016     Microbiology: Recent Results (from the past 240 hour(s))  Blood culture (routine x 2)  Status: None (Preliminary result)   Collection Time: 01/29/17  2:07 AM  Result Value Ref Range Status   Specimen Description BLOOD LEFT ANTECUBITAL  Final   Special Requests   Final    BOTTLES DRAWN AEROBIC AND ANAEROBIC Blood Culture adequate volume   Culture  Setup Time   Final    GRAM POSITIVE COCCI IN CHAINS ANAEROBIC BOTTLE ONLY CRITICAL RESULT CALLED TO, READ BACK BY AND VERIFIED WITH: J. CARNEY,PHARMD 1902 01/29/2017 TRosalia Hammers    Culture GRAM POSITIVE COCCI  Final   Report Status PENDING  Incomplete    Michel Bickers, MD [facility] [phone] pager   [phone] cell 01/30/2017, 11:48 AM

## 2017-01-30 NOTE — Progress Notes (Signed)
PROGRESS NOTE    Destiny Shelton  JXB:[id number] DOB: 13-Jun-1945 DOA: 01/29/2017 PCP: Marton Redwood, MD    Brief Narrative:  71 year old female who presented with epistaxis. Patient is known to have diastolic heart failure, coronary artery disease, chronic hypoxic respiratory failure, paroxysmal atrial fibrillation and type 2 diabetes mellitus. Patient developed acute and severe epistaxis, few days prior to hospitalization she has noticed increase cough with increased mucus production, associated with wheezing and nasal congestion. She does have chronic dyspnea with limited functional physical capacity. On initial physical examination blood pressure 89/37, 91/35, heart rate 112, respiratory rate 20, temperature 98, oxygen saturation 100%. Moist mucous membranes, decrease breath sounds bilaterally, heart S1-S2 present tachycardic, rate rales or rubs, the abdomen was soft nontender, no lower extremity edema. Sodium 137, potassium 3.7, chloride 97, bicarbonate 28, glucose 146, BUN 20, creatinine 1.28, white count 14.4, hemoglobin 7.8, hematocrit 27.4, platelet 333. EKG sinus rhythm with a right bundle branch block. Chest x-ray hypoinflated, bibasilar infiltrates, left greater than the right.  Patient was admitted to the hospital with working diagnosis of sepsis due to community-acquired pneumonia, complicated by COPD exacerbation.    Assessment & Plan:   Principal Problem:   Sepsis ([place]) Active Problems:   Diabetes mellitus, type II, insulin dependent ([place])   Essential hypertension   PAF (paroxysmal atrial fibrillation) ([place])   CAP (community acquired pneumonia)   Bleeding from the nose   1. Sepsis due to community acquired pneumonia. Will continue antibiotic therapy with ceftriaxone and azithromycin, blood culture with positive gram stain for cocci in chains. Will continue to hold on IV fluids, follow a restrictive IV strategy. Follow on cell count and temperature curve. Will follow on chest  film today.   2. COPD exacerbation with acute on chronic hypoxemic respiratory failure. Will continue oxymetry monitoring and supplemental 02. Will attempt wean to high flow nasal cannula, target 02 saturation above 88%. Patient on home 02, 5 to 8 LPM. Will check abg to rule out respiratory acidosis and will follow chest film. No significant wheezing, will hold on systemic steroids for now, no respiratory distress.   3. Acute blood loss anemia due to epistaxis. Hb and hct are stable, will continue to follow on cell count in am, no signs of further epistaxis, continue close monitoring.   4. Paroxysmal atrial fibrillation. Will continue rate control with verapamil, continue to hold on rivaroxaban for now.   5. Hypokalemia. Continue repletion with po kcl, follow electrolytes in am, will avoid hypotension or nephrotoxic agents. Renal function preserved with serum cr at 0.71 with Na at 138.   5. Type 2 diabetes mellitus. Continue glucose cover and monitoring with insulin sliding scale, capillary glucose 119, 138, 134.  6. Morbid obesity. Continue dvt px and early mobility.    DVT prophylaxis:  Code Status:  Family Communication:  Disposition Plan:    Consultants:     Procedures:     Antimicrobials:   Ceftriaxone  Azithromycin   Subjective: Patient has been somnolent this am, had xanax last night. No more epistaxis. Reports improved dyspnea but persistent cough, and wheezing, no fever or chills at home. Somnolence has been persistent, moderate in intensity, improved with prompting, no associated confusion or agitation.   Objective: Vitals:   01/30/17 0329 01/30/17 0740 01/30/17 0925 01/30/17 0937  BP: (!) 115/44 106/78    Pulse: (!) 108 (!) 112    Resp: 18 20    Temp: 98.9 F (37.2 C) 98.5 F (36.9 C)  TempSrc: Axillary Axillary    SpO2: 99%  100% 92%  Weight: 94.6 kg (208 lb 9.6 oz)     Height:        Intake/Output Summary (Last 24 hours) at 01/30/17 1046 Last data  filed at 01/30/17 1020  Gross per 24 hour  Intake              672 ml  Output             4310 ml  Net            -3638 ml   Filed Weights   01/29/17 0040 01/30/17 0329  Weight: 99.8 kg (220 lb) 94.6 kg (208 lb 9.6 oz)    Examination:  General: deconditioned and ill looking appearing Neurology: Awake and alert, non focal  E ENT: positive pallor, no icterus, oral mucosa moist Cardiovascular: S1-S2 present, rhythmic, no gallops, rubs, or murmurs. No jugular venous distention, no lower extremity edema. Pulmonary: decrased breath sounds bilaterally, adequate air movement, no frank wheezing, rhonchi or rales.Poor inspiratory effort.  Gastrointestinal. Abdomen flat, no organomegaly, non tender, no rebound or guarding Skin. No rashes Musculoskeletal: no joint deformities     Data Reviewed: I have personally reviewed following labs and imaging studies  CBC:  Recent Labs Lab 01/29/17 0037 01/29/17 0544 01/29/17 1656 01/30/17 0653  WBC 14.4* 12.1* 13.5* 11.4*  NEUTROABS 11.4*  --   --   --   HGB 7.8* 8.1* 8.5* 9.3*  HCT 27.4* 28.4* 29.1* 31.6*  MCV 78.5 79.3 78.9 78.6  PLT 333 289 284 956   Basic Metabolic Panel:  Recent Labs Lab 01/29/17 0037 01/30/17 0653  NA 137 138  K 3.7 2.8*  CL 97* 91*  CO2 28 36*  GLUCOSE 146* 137*  BUN 20 8  CREATININE 1.28* 0.76  CALCIUM 8.8* 9.0   GFR: Estimated Creatinine Clearance: 73.7 mL/min (by C-G formula based on SCr of 0.76 mg/dL). Liver Function Tests: No results for input(s): AST, ALT, ALKPHOS, BILITOT, PROT, ALBUMIN in the last 168 hours. No results for input(s): LIPASE, AMYLASE in the last 168 hours. No results for input(s): AMMONIA in the last 168 hours. Coagulation Profile:  Recent Labs Lab 01/29/17 0037  INR 1.85   Cardiac Enzymes: No results for input(s): CKTOTAL, CKMB, CKMBINDEX, TROPONINI in the last 168 hours. BNP (last 3 results)  Recent Labs  05/19/16 1059 11/25/16 1518  PROBNP 47 42   HbA1C: No  results for input(s): HGBA1C in the last 72 hours. CBG:  Recent Labs Lab 01/29/17 0911 01/29/17 1314 01/29/17 1729 01/29/17 2202 01/30/17 0736  GLUCAP 83 116* 175* 119* 138*   Lipid Profile: No results for input(s): CHOL, HDL, LDLCALC, TRIG, CHOLHDL, LDLDIRECT in the last 72 hours. Thyroid Function Tests: No results for input(s): TSH, T4TOTAL, FREET4, T3FREE, THYROIDAB in the last 72 hours. Anemia Panel: No results for input(s): VITAMINB12, FOLATE, FERRITIN, TIBC, IRON, RETICCTPCT in the last 72 hours.    Radiology Studies: I have reviewed all of the imaging during this hospital visit personally     Scheduled Meds: . arformoterol  15 mcg Nebulization BID  . atorvastatin  40 mg Oral QHS  . budesonide (PULMICORT) nebulizer solution  0.5 mg Nebulization BID  . buPROPion  300 mg Oral Daily  . chlorhexidine  15 mL Mouth Rinse BID  . furosemide  80 mg Oral BID  . insulin aspart  0-15 Units Subcutaneous TID WC  . insulin aspart  0-5 Units Subcutaneous QHS  .  iron polysaccharides  150 mg Oral BID  . mouth rinse  15 mL Mouth Rinse q12n4p  . pantoprazole  40 mg Oral Daily  . polyethylene glycol  17 g Oral Daily  . polyvinyl alcohol  1 drop Both Eyes BID  . potassium chloride  30 mEq Oral BID  . roflumilast  500 mcg Oral Daily  . sodium chloride HYPERTONIC  4 mL Nebulization BID  . tiotropium  18 mcg Inhalation Daily  . verapamil  360 mg Oral Daily   Continuous Infusions: . azithromycin Stopped (01/30/17 0102)  . cefTRIAXone (ROCEPHIN)  IV Stopped (01/29/17 2235)  . vancomycin Stopped (01/30/17 0925)     LOS: 1 day      Vaneta Hammontree Gerome Apley, MD [facility] Pager [phone]

## 2017-01-30 NOTE — Progress Notes (Signed)
Patient transitioned from non-rebreather to high-flow [place] per MD suggestion. Will continue to monitor for signs of bleeding.

## 2017-01-31 DIAGNOSIS — J181 Lobar pneumonia, unspecified organism: Secondary | ICD-10-CM

## 2017-01-31 DIAGNOSIS — D649 Anemia, unspecified: Secondary | ICD-10-CM

## 2017-01-31 DIAGNOSIS — B952 Enterococcus as the cause of diseases classified elsewhere: Secondary | ICD-10-CM

## 2017-01-31 LAB — CBC WITH DIFFERENTIAL/PLATELET
BASOS ABS: 0 10*3/uL (ref 0.0–0.1)
Basophils Relative: 0 %
EOS ABS: 0.1 10*3/uL (ref 0.0–0.7)
EOS PCT: 1 %
HEMATOCRIT: 30.8 % — AB (ref 36.0–46.0)
Hemoglobin: 9.1 g/dL — ABNORMAL LOW (ref 12.0–15.0)
LYMPHS ABS: 1.4 10*3/uL (ref 0.7–4.0)
Lymphocytes Relative: 12 %
MCH: 23.1 pg — AB (ref 26.0–34.0)
MCHC: 29.5 g/dL — ABNORMAL LOW (ref 30.0–36.0)
MCV: 78.2 fL (ref 78.0–100.0)
MONOS PCT: 13 %
Monocytes Absolute: 1.4 10*3/uL — ABNORMAL HIGH (ref 0.1–1.0)
Neutro Abs: 8.3 10*3/uL — ABNORMAL HIGH (ref 1.7–7.7)
Neutrophils Relative %: 74 %
PLATELETS: 290 10*3/uL (ref 150–400)
RBC: 3.94 MIL/uL (ref 3.87–5.11)
RDW: 16 % — ABNORMAL HIGH (ref 11.5–15.5)
WBC: 11.2 10*3/uL — AB (ref 4.0–10.5)

## 2017-01-31 LAB — BASIC METABOLIC PANEL
ANION GAP: 11 (ref 5–15)
BUN: 10 mg/dL (ref 6–20)
CHLORIDE: 91 mmol/L — AB (ref 101–111)
CO2: 35 mmol/L — AB (ref 22–32)
Calcium: 8.8 mg/dL — ABNORMAL LOW (ref 8.9–10.3)
Creatinine, Ser: 0.89 mg/dL (ref 0.44–1.00)
GFR calc Af Amer: >60 mL/min (ref >60)
GLUCOSE: 151 mg/dL — AB (ref 65–99)
POTASSIUM: 3 mmol/L — AB (ref 3.5–5.1)
Sodium: 137 mmol/L (ref 135–145)

## 2017-01-31 LAB — CULTURE, BLOOD (ROUTINE X 2): Special Requests: ADEQUATE

## 2017-01-31 LAB — GLUCOSE, CAPILLARY
GLUCOSE-CAPILLARY: 184 mg/dL — AB (ref 65–99)
Glucose-Capillary: 159 mg/dL — ABNORMAL HIGH (ref 65–99)
Glucose-Capillary: 146 mg/dL — ABNORMAL HIGH (ref 65–99)
Glucose-Capillary: 157 mg/dL — ABNORMAL HIGH (ref 65–99)

## 2017-01-31 MED ORDER — RIVAROXABAN 20 MG PO TABS
20.0000 mg | ORAL_TABLET | Freq: Every day | ORAL | Status: DC
  Filled 2017-01-31: qty 1

## 2017-01-31 MED ORDER — POTASSIUM CHLORIDE CRYS ER 20 MEQ PO TBCR
40.0000 meq | EXTENDED_RELEASE_TABLET | Freq: Two times a day (BID) | ORAL | Status: DC
  Administered 2017-01-31 – 2017-02-01 (×3): 40 meq via ORAL
  Filled 2017-01-31 (×3): qty 2

## 2017-01-31 MED ORDER — METHYLPREDNISOLONE SODIUM SUCC 40 MG IJ SOLR
20.0000 mg | INTRAMUSCULAR | Status: DC
  Administered 2017-01-31 – 2017-02-01 (×2): 20 mg via INTRAVENOUS
  Filled 2017-01-31 (×2): qty 1

## 2017-01-31 MED ORDER — FUROSEMIDE 40 MG PO TABS
40.0000 mg | ORAL_TABLET | Freq: Every day | ORAL | Status: DC
  Administered 2017-01-31 – 2017-02-01 (×2): 40 mg via ORAL
  Filled 2017-01-31 (×2): qty 1

## 2017-01-31 MED ORDER — IPRATROPIUM-ALBUTEROL 0.5-2.5 (3) MG/3ML IN SOLN
3.0000 mL | Freq: Three times a day (TID) | RESPIRATORY_TRACT | Status: DC
  Administered 2017-02-01: 3 mL via RESPIRATORY_TRACT
  Filled 2017-01-31: qty 3

## 2017-01-31 NOTE — Progress Notes (Signed)
PROGRESS NOTE    Destiny Shelton  MEQ:[id number] DOB: [birth year] DOA: 01/29/2017 PCP: Marton Redwood, MD    Brief Narrative:  71 year old female who presented with epistaxis. Patient is known to have diastolic heart failure, coronary artery disease, chronic hypoxic respiratory failure, paroxysmal atrial fibrillation and type 2 diabetes mellitus. Patient developed acute and severe epistaxis, few days prior to hospitalization she has noticed increase cough with increased mucus production, associated with wheezing and nasal congestion. She does have chronic dyspnea with limited functional physical capacity. On initial physical examination blood pressure 89/37, 91/35, heart rate 112, respiratory rate 20, temperature 98, oxygen saturation 100%. Moist mucous membranes, decrease breath sounds bilaterally, heart S1-S2 present tachycardic, rate rales or rubs, the abdomen was soft nontender, no lower extremity edema. Sodium 137, potassium 3.7, chloride 97, bicarbonate 28, glucose 146, BUN 20, creatinine 1.28, white count 14.4, hemoglobin 7.8, hematocrit 27.4, platelet 333. EKG sinus rhythm with a right bundle branch block. Chest x-ray hypoinflated, bibasilar infiltrates, left greater than the right.  Patient was admitted to the hospital with working diagnosis of sepsis due to community-acquired pneumonia, complicated by COPD exacerbation.    Assessment & Plan:   Principal Problem:   Enterococcal bacteremia Active Problems:   Diabetes mellitus, type II, insulin dependent (HCC)   Essential hypertension   PAF (paroxysmal atrial fibrillation) (HCC)   CAP (community acquired pneumonia)   Sepsis ([place])   Bleeding from the nose   1. Sepsis due to suspected community acquired pneumonia. Follow chest film personally reviewed, with bibasilar atelectasis, no signs of pneumonic infiltrates, will follow on repeat blood cultures, if no growth enterococcus may be a contaminant and will discontinue antibiotic  therapy. Will discontinue telemetry, continue to hold on IV fluids.   2. COPD exacerbation with acute on chronic hypoxemic respiratory failure. Successfully transitioned to high flown nasal cannula, will continue bronchodilator therapy and will add low dose of systemic steroids, will continue oxymetry monitoring, continue to wean down supplemental -02, at home on 8 LPM. Target oxymetry saturation greater than 88%  3. Acute blood loss anemia due to epistaxis. Hb and hct continue to be stable, at 9.1 and 30.8, risk vs benefit will continue anticoagulation with rivaroxaban.  4. Paroxysmal atrial fibrillation. Rate controlled with verapamil, resume rivaroxaban for anticoagulation.   5. Hypokalemia. Will increase kcl to 40 meq bid and will follow on renal panel in am, will resume oral furosemide with 40 mg po daily.    5. Type 2 diabetes mellitus. Insulin sliding scale, for glucose cover and monitoring. Capillary glucose 134, 161, 159, 146. Patient tolerating po well.   6. Morbid obesity. Continue dvt px and early mobility.   7. Hypertension. Will continue to hold on irbersartan to prevent hypotension, blood pressure systolic 94 to 297.    DVT prophylaxis:  Code Status:  Family Communication:  Disposition Plan:    Consultants:     Procedures:     Antimicrobials:   Augmentin.     Subjective: Patient feeling better, no nausea or vomiting, improved appetite, dyspnea and cough improving. Patient tolerating well high flow nasal cannula.   Objective: Vitals:   01/31/17 0332 01/31/17 0713 01/31/17 0730 01/31/17 0732  BP: (!) 113/45 94/67    Pulse: (!) 102 (!) 109    Resp: 18 18    Temp: 98.3 F (36.8 C) 98.9 F (37.2 C)    TempSrc: Oral Oral    SpO2: 100% 98% 96% 96%  Weight: 94.3 kg (207 lb 14.3 oz)  Height:        Intake/Output Summary (Last 24 hours) at 01/31/17 0902 Last data filed at 01/31/17 0334  Gross per 24 hour  Intake              480 ml    Output             1350 ml  Net             -870 ml   Filed Weights   01/29/17 0040 01/30/17 0329 01/31/17 0332  Weight: 99.8 kg (220 lb) 94.6 kg (208 lb 9.6 oz) 94.3 kg (207 lb 14.3 oz)    Examination:  General: deconditioned Neurology: Awake and alert, non focal  E ENT: mild pallor, no icterus, oral mucosa moist Cardiovascular: S1-S2 present, rhythmic, no gallops, rubs, or murmurs. No jugular venous distention, no lower extremity edema. Pulmonary: decreased breath sounds bilaterally, but adequate air movement, no wheezing, rhonchi, scattered bilateral rales. Gastrointestinal. Abdomen protuberant, no organomegaly, non tender, no rebound or guarding Skin. No rashes Musculoskeletal: no joint deformities     Data Reviewed: I have personally reviewed following labs and imaging studies  CBC:  Recent Labs Lab 01/29/17 0037 01/29/17 0544 01/29/17 1656 01/30/17 0653 01/31/17 0406  WBC 14.4* 12.1* 13.5* 11.4* 11.2*  NEUTROABS 11.4*  --   --   --  8.3*  HGB 7.8* 8.1* 8.5* 9.3* 9.1*  HCT 27.4* 28.4* 29.1* 31.6* 30.8*  MCV 78.5 79.3 78.9 78.6 78.2  PLT 333 289 284 302 240   Basic Metabolic Panel:  Recent Labs Lab 01/29/17 0037 01/30/17 0653 01/31/17 0406  NA 137 138 137  K 3.7 2.8* 3.0*  CL 97* 91* 91*  CO2 28 36* 35*  GLUCOSE 146* 137* 151*  BUN 20 8 10   CREATININE 1.28* 0.76 0.89  CALCIUM 8.8* 9.0 8.8*   GFR: Estimated Creatinine Clearance: 66.1 mL/min (by C-G formula based on SCr of 0.89 mg/dL). Liver Function Tests: No results for input(s): AST, ALT, ALKPHOS, BILITOT, PROT, ALBUMIN in the last 168 hours. No results for input(s): LIPASE, AMYLASE in the last 168 hours. No results for input(s): AMMONIA in the last 168 hours. Coagulation Profile:  Recent Labs Lab 01/29/17 0037  INR 1.85   Cardiac Enzymes: No results for input(s): CKTOTAL, CKMB, CKMBINDEX, TROPONINI in the last 168 hours. BNP (last 3 results)  Recent Labs  05/19/16 1059 11/25/16 1518   PROBNP 47 42   HbA1C: No results for input(s): HGBA1C in the last 72 hours. CBG:  Recent Labs Lab 01/30/17 0736 01/30/17 1213 01/30/17 1643 01/30/17 2145 01/31/17 0714  GLUCAP 138* 134* 161* 139* 159*   Lipid Profile: No results for input(s): CHOL, HDL, LDLCALC, TRIG, CHOLHDL, LDLDIRECT in the last 72 hours. Thyroid Function Tests: No results for input(s): TSH, T4TOTAL, FREET4, T3FREE, THYROIDAB in the last 72 hours. Anemia Panel: No results for input(s): VITAMINB12, FOLATE, FERRITIN, TIBC, IRON, RETICCTPCT in the last 72 hours.    Radiology Studies: I have reviewed all of the imaging during this hospital visit personally     Scheduled Meds: . amoxicillin-clavulanate  1 tablet Oral Q12H  . arformoterol  15 mcg Nebulization BID  . atorvastatin  40 mg Oral QHS  . budesonide (PULMICORT) nebulizer solution  0.5 mg Nebulization BID  . buPROPion  300 mg Oral Daily  . chlorhexidine  15 mL Mouth Rinse BID  . insulin aspart  0-15 Units Subcutaneous TID WC  . insulin aspart  0-5 Units Subcutaneous QHS  . ipratropium-albuterol  3 mL Nebulization Q6H  . iron polysaccharides  150 mg Oral BID  . mouth rinse  15 mL Mouth Rinse q12n4p  . pantoprazole  40 mg Oral Daily  . polyethylene glycol  17 g Oral Daily  . polyvinyl alcohol  1 drop Both Eyes BID  . potassium chloride  30 mEq Oral BID  . roflumilast  500 mcg Oral Daily  . sodium chloride HYPERTONIC  4 mL Nebulization BID  . verapamil  360 mg Oral Daily   Continuous Infusions:   LOS: 2 days      Roselinda Bahena Gerome Apley, MD [facility] Pager [phone]

## 2017-01-31 NOTE — Progress Notes (Signed)
Consult note reviewed from yesterday -including recommendations for holding Xarelto and consultation to identify and possibly treat source of bleeding. If this can be accomplished then would consider resuming anticoagulation at some point in the future. Watchman LAA occluder implantation is also a long-term possibility, but not considered at this time. Continue current CHF treatment - cardiology will be available as needed but sign-off for now. Follow-up with Dr. Aundra Dubin.  Pixie Casino, MD, [place]  Attending Cardiologist  Direct Dial: [phone]  Fax: [phone]  Website:  [email or web address]

## 2017-01-31 NOTE — Progress Notes (Addendum)
Destiny Shelton is a 71 y.o. female patient admitted from [place] awake, alert - oriented  X 4 - no acute distress noted.  VSS - Blood pressure (!) 123/55, pulse 95, temperature 98 F (36.7 C), temperature source Oral, resp. rate 18, height 5\' 6"  (1.676 m), weight 94.3 kg (207 lb 14.3 oz), SpO2 95 %.    IV in place, occlusive dsg intact without redness.  Orientation to room, and floor completed with information packet given to patient/family.  Patient declined safety video at this time.  Admission INP armband ID verified with patient/family, and in place.   SR up x 2, fall assessment complete, with patient and family able to verbalize understanding of risk associated with falls, and verbalized understanding to call nsg before up out of bed.  Call light within reach, patient able to voice, and demonstrate understanding.  Skin, clean-dry- intact without evidence of bruising, or skin tears.   No evidence of skin break down noted on exam.     Will cont to eval and treat per MD orders.  Dorris Carnes, RN 01/31/2017 6:21 PM

## 2017-02-01 DIAGNOSIS — J441 Chronic obstructive pulmonary disease with (acute) exacerbation: Secondary | ICD-10-CM

## 2017-02-01 LAB — CBC WITH DIFFERENTIAL/PLATELET
BASOS PCT: 0 %
Basophils Absolute: 0 10*3/uL (ref 0.0–0.1)
Eosinophils Absolute: 0.1 10*3/uL (ref 0.0–0.7)
Eosinophils Relative: 1 %
HEMATOCRIT: 30.8 % — AB (ref 36.0–46.0)
HEMOGLOBIN: 8.9 g/dL — AB (ref 12.0–15.0)
LYMPHS ABS: 1.5 10*3/uL (ref 0.7–4.0)
Lymphocytes Relative: 14 %
MCH: 22.6 pg — ABNORMAL LOW (ref 26.0–34.0)
MCHC: 28.9 g/dL — AB (ref 30.0–36.0)
MCV: 78.2 fL (ref 78.0–100.0)
MONO ABS: 1.3 10*3/uL — AB (ref 0.1–1.0)
MONOS PCT: 12 %
NEUTROS ABS: 7.9 10*3/uL — AB (ref 1.7–7.7)
Neutrophils Relative %: 73 %
Platelets: 280 10*3/uL (ref 150–400)
RBC: 3.94 MIL/uL (ref 3.87–5.11)
RDW: 16.3 % — AB (ref 11.5–15.5)
WBC: 10.8 10*3/uL — ABNORMAL HIGH (ref 4.0–10.5)

## 2017-02-01 LAB — BASIC METABOLIC PANEL
Anion gap: 10 (ref 5–15)
BUN: 12 mg/dL (ref 6–20)
CALCIUM: 9.2 mg/dL (ref 8.9–10.3)
CHLORIDE: 94 mmol/L — AB (ref 101–111)
CO2: 34 mmol/L — AB (ref 22–32)
CREATININE: 0.82 mg/dL (ref 0.44–1.00)
GFR calc non Af Amer: >60 mL/min (ref >60)
GLUCOSE: 159 mg/dL — AB (ref 65–99)
Potassium: 3.7 mmol/L (ref 3.5–5.1)
Sodium: 138 mmol/L (ref 135–145)

## 2017-02-01 LAB — GLUCOSE, CAPILLARY
Glucose-Capillary: 219 mg/dL — ABNORMAL HIGH (ref 65–99)
Glucose-Capillary: 150 mg/dL — ABNORMAL HIGH (ref 65–99)

## 2017-02-01 MED ORDER — PREDNISONE 20 MG PO TABS: 20.0000 mg | ORAL_TABLET | Freq: Every day | ORAL | 0 refills | Status: AC

## 2017-02-01 MED ORDER — ACETAMINOPHEN 500 MG PO TABS: 500.0000 mg | ORAL_TABLET | Freq: Four times a day (QID) | ORAL | 0 refills | Status: AC | PRN

## 2017-02-01 MED ORDER — ASPIRIN EC 81 MG PO TBEC
81.0000 mg | DELAYED_RELEASE_TABLET | Freq: Every day | ORAL | Status: DC
  Administered 2017-02-01: 81 mg via ORAL
  Filled 2017-02-01: qty 1

## 2017-02-01 MED ORDER — ASPIRIN 81 MG PO TBEC: 81.0000 mg | DELAYED_RELEASE_TABLET | Freq: Every day | ORAL | 0 refills | Status: AC

## 2017-02-01 NOTE — Discharge Summary (Signed)
Physician Discharge Summary  [facility] IRS:[id number] DOB: 04-11-1946 DOA: 01/29/2017  PCP: Marton Redwood, MD  Admit date: 01/29/2017 Discharge date: 02/01/2017  Admitted From: Home Disposition:  Home  Recommendations for Outpatient Follow-up:  1. Follow up with PCP in 1- week 2. Patient has been instructed to use humidification with supplemental oxygen at all times, try to keep oxygen flow at 5 LPM as much as possible, if 8 liters is required, to use it for brief period of time, keep oxygen saturation greater than 88%.  3. Will refer patient to ENT before Rivaroxaban can been resumed.   Home Health: No   Equipment/Devices: Home 02   Discharge Condition: Stable CODE STATUS: Full  Diet recommendation: Heart healthy and diabetic  Brief/Interim Summary: 71 year old female who presented with epistaxis. Patient is known to have diastolic heart failure, coronary artery disease, COPD with chronic hypoxic respiratory failure, paroxysmal atrial fibrillation and type 2 diabetes mellitus. Patient developed acute and severe epistaxis, few days prior to hospitalization she has noticed increase cough with increased mucus production, associated with wheezing and nasal congestion. She does have chronic dyspnea with limited functional physical capacity. On initial physical examination blood pressure 89/37, 91/35, heart rate 112, respiratory rate 20, temperature 98, oxygen saturation 100% on 100% fi02. Moist mucous membranes, decrease breath sounds bilaterally, heart S1-S2 present tachycardic no gallops, murmurs or rubs, lungs with decreased air movement, the abdomen was soft nontender, no lower extremity edema. Sodium 137, potassium 3.7, chloride 97, bicarbonate 28, glucose 146, BUN 20, creatinine 1.28, white count 14.4, hemoglobin 7.8, hematocrit 27.4, platelet 333. EKG sinus rhythm with a right bundle branch block. Chest x-ray hypoinflated, bibasilar infiltrates, left greater than the right.  Patient was  admitted to the hospital with working diagnosis of sepsis due to suspected community-acquired pneumonia, complicated by COPD exacerbation and epistaxis.   1. COPD exacerbation, with acute on chronic hypoxic respiratory failure. Patient was admitted to the stepdown unit, she initially required 100% FiO2, she was successfully transitioned to regular nasal cannula 5 to 8 liters per minute with good toleration. Patient was placed on systemic steroids, continue aggressive bronchodilator therapy. Her dyspnea has improved back to her baseline. She will continue 3 days more of prednisone. Antibiotics have been discontinued.  2. Epistaxis with acute blood loss anemia. Rivaroxaban was held. Her hemoglobin and hematocrit remained stable, hemoglobin 8.1-8.9, hematocrit 28.4-30.8. No need for blood transfusion. The epistaxis was presumed to be related to the use of high flow supplemental oxygen, 5-8 L. Patient was instructed to use humidified supplemental oxygen at all times, try to keep the flow at 5 L as much as possible, use 8 L only for brief periods of time as needed, keep oxygen saturation above 88%. With refer to ENT outpatient evaluation, try to identify treatable source of bleeding, for now patient will be taking aspirin for antiplatelet therapy.   3. Sepsis and community acquired pneumonia, ruled out. Patient had initial working diagnosis of sepsis due to community acquired pneumonia, further chest imaging rule out significant infiltrate, no leukocytosis or fever. Patient had 1 bottle blood culture positive for enterococcus faecalis, that is presumed to be a contaminant. Repeat cultures no growth. Antibiotics were discontinued.   4. Transient hypokalemia. She received potassium chloride for repletion with good toleration,discharge potassium 3.7, preserved kidney function with a serum creatinine 0.82, patient will resume high dose of furosemide per her home regimen, continue potassium supplements. Recommend  follow-up closely kidney function as outpatient.   5. Paroxysmal atrial fibrillation.  Rate remained well-controlled with verapamil. She has significant risk factors for thrombotic, he may benefit from continuing anticoagulation. Refer for outpatient ENT evaluation, try to identify bleeding source. For now will continue antiplatelet therapy with aspirin.   6. Type 2 diabetes mellitus. Patient was placed on insulin sliding scale for glucose coverage and monitoring, capillary glucose remained well-controlled.   Discharge Diagnoses:  Principal Problem:   Enterococcal bacteremia Active Problems:   Diabetes mellitus, type II, insulin dependent (HCC)   Essential hypertension   PAF (paroxysmal atrial fibrillation) (HCC)   CAP (community acquired pneumonia)   Sepsis ([place])   Bleeding from the nose    Discharge Instructions   Allergies as of 02/01/2017      Reactions   Aspirin Shortness Of Breath   Nsaids Shortness Of Breath      Medication List    STOP taking these medications   azithromycin 250 MG tablet Commonly known as:  ZITHROMAX   rivaroxaban 20 MG Tabs tablet Commonly known as:  XARELTO     TAKE these medications   acetaminophen 500 MG tablet Commonly known as:  TYLENOL Take 1 tablet (500 mg total) by mouth every 6 (six) hours as needed for mild pain or headache. What changed:  how much to take   albuterol (2.5 MG/3ML) 0.083% nebulizer solution Commonly known as:  PROVENTIL Take 3 mLs (2.5 mg total) by nebulization every 6 (six) hours as needed for wheezing or shortness of breath.   ALPRAZolam 0.25 MG tablet Commonly known as:  XANAX Take 0.25 mg by mouth at bedtime as needed for anxiety.   ARTIFICIAL TEARS 1.4 % ophthalmic solution Generic drug:  polyvinyl alcohol Place 1 drop into both eyes 2 (two) times daily.   aspirin 81 MG EC tablet Take 1 tablet (81 mg total) by mouth daily.   atorvastatin 40 MG tablet Commonly known as:  LIPITOR Take 40 mg by mouth at  bedtime.   benzonatate 200 MG capsule Commonly known as:  TESSALON Take 200 mg by mouth at bedtime.   buPROPion 300 MG 24 hr tablet Commonly known as:  WELLBUTRIN XL Take 300 mg by mouth daily.   DULoxetine 60 MG capsule Commonly known as:  CYMBALTA Take 60 mg by mouth daily.   furosemide 40 MG tablet Commonly known as:  LASIX Take 2 tablets (80 mg total) by mouth 2 (two) times daily.   gabapentin 600 MG tablet Commonly known as:  NEURONTIN Take 600 mg by mouth 3 (three) times daily.   HYDROcodone-acetaminophen 5-325 MG tablet Commonly known as:  NORCO/VICODIN Take 1 tablet by mouth every 4 (four) hours as needed for moderate pain. Reported on 12/12/2015   insulin glargine 100 UNIT/ML injection Commonly known as:  LANTUS Inject 0.66 mLs (66 Units total) into the skin daily before breakfast. What changed:  how much to take   insulin lispro 100 UNIT/ML injection Commonly known as:  HUMALOG Inject 6 Units into the skin See admin instructions. Inject 6 units subcutaneously twice daily (with breakfast and supper) if CBG >150   irbesartan 150 MG tablet Commonly known as:  AVAPRO Take 1 tablet (150 mg total) by mouth daily.   iron polysaccharides 150 MG capsule Commonly known as:  NIFEREX Take 1 capsule (150 mg total) by mouth 2 (two) times daily.   levalbuterol 45 MCG/ACT inhaler Commonly known as:  XOPENEX HFA Inhale 1-2 puffs into the lungs every 4 (four) hours as needed for wheezing. What changed:  how much to take  reasons to take this   metolazone 5 MG tablet Commonly known as:  ZAROXOLYN Take 5 mg by mouth 2 (two) times a week.   MUCINEX MAXIMUM STRENGTH 1200 MG Tb12 Generic drug:  Guaifenesin Take 1,200 mg by mouth 2 (two) times daily as needed (cough / congestion).   multivitamin with minerals Tabs tablet Take 1 tablet by mouth daily.   omeprazole 20 MG capsule Commonly known as:  PRILOSEC Take 20 mg by mouth daily after supper.   OXYGEN Inhale 5-8  L into the lungs See admin instructions. Use 5L when sitting and 8 L when up and moving   polyethylene glycol packet Commonly known as:  MIRALAX / GLYCOLAX Take 17 g by mouth daily. Mix in 8 oz liquid and drink   potassium chloride 10 MEQ tablet Commonly known as:  K-DUR,KLOR-CON Take 3 tablets (30 mEq total) by mouth 2 (two) times daily.   predniSONE 20 MG tablet Commonly known as:  DELTASONE Take 1 tablet (20 mg total) by mouth daily with breakfast.   roflumilast 500 MCG Tabs tablet Commonly known as:  DALIRESP Take 500 mcg by mouth daily.   sodium chloride HYPERTONIC 3 % nebulizer solution Take by nebulization 2 (two) times daily. What changed:  how much to take   SPIRIVA HANDIHALER 18 MCG inhalation capsule Generic drug:  tiotropium INHALE THE CONTENTS OF 1 CAPSULE VIA HANDIHALER BY MOUTH EVERY MORNING   SYMBICORT 160-4.5 MCG/ACT inhaler Generic drug:  budesonide-formoterol INHALE 2 PUFFS BY MOUTH TWICE DAILY   verapamil 360 MG 24 hr capsule Commonly known as:  VERELAN PM TAKE ONE CAPSULE BY MOUTH DAILY What changed:  See the new instructions.            Discharge Care Instructions        Start     Ordered   02/01/17 0000  acetaminophen (TYLENOL) 500 MG tablet  Every 6 hours PRN     02/01/17 0920   02/01/17 0000  predniSONE (DELTASONE) 20 MG tablet  Daily with breakfast     02/01/17 0920   02/01/17 0000  Increase activity slowly     02/01/17 0920   02/01/17 0000  Diet - low sodium heart healthy     02/01/17 0920   02/01/17 0000  Discharge instructions    Comments:  Please continue to use supplemental oxygen with humidification at all times, keep oxygen flow at 5 lpm as much as possible, if have to use 8 liters use for brief period of time, keep oxygen saturation above 88%.   02/01/17 0920   02/01/17 0000  aspirin EC 81 MG EC tablet  Daily     02/01/17 0920      Allergies  Allergen Reactions  . Aspirin Shortness Of Breath  . Nsaids Shortness Of  Breath    Consultations:  Cardiology   Infectious disease    Procedures/Studies: Dg Chest Port 1 View  Result Date: 01/30/2017 CLINICAL DATA:  Dyspnea. EXAM: PORTABLE CHEST 1 VIEW COMPARISON:  Radiograph of January 29, 2017. FINDINGS: Stable cardiomediastinal silhouette. Atherosclerosis of abdominal aorta is noted. No pneumothorax or significant pleural effusion is noted. Stable bibasilar opacities are noted most consistent with subsegmental atelectasis or scarring. Stable old left lower rib fractures are noted. IMPRESSION: Aortic atherosclerosis. Stable bibasilar subsegmental atelectasis or scarring. Electronically Signed   By: Marijo Conception, M.D.   On: 01/30/2017 12:00   Dg Chest Portable 1 View  Result Date: 01/29/2017 CLINICAL DATA:  Epistaxis.  Possible aspiration  of blood. EXAM: PORTABLE CHEST 1 VIEW COMPARISON:  11/05/2016 FINDINGS: Patchy airspace opacities in the bases, left greater than right. This could represent pneumonia or aspiration. No large effusion. Normal pulmonary vasculature. Unchanged hilar and mediastinal contours. IMPRESSION: Left greater than right patchy airspace opacities. Cannot exclude pneumonia or aspiration. Electronically Signed   By: Andreas Newport M.D.   On: 01/29/2017 00:55       Subjective: Patient feeling better, no further epistaxis, dyspnea has improved, no nausea or vomiting, no chest pain.   Discharge Exam: Vitals:   02/01/17 0814 02/01/17 0820  BP:    Pulse:    Resp:    Temp:    SpO2: 98% 100%   Vitals:   02/01/17 0445 02/01/17 0809 02/01/17 0814 02/01/17 0820  BP:      Pulse:      Resp:      Temp:      TempSrc:      SpO2: 95% 92% 98% 100%  Weight:      Height:        General: Pt is alert, awake, not in acute distress E ENT. Mild pallor, no icterus, oral mucosa moist.  Cardiovascular: RRR, S1/S2 +, no rubs, no gallops Respiratory: Decreased air movement bilaterally, CTA bilaterally, no wheezing, no rhonchi Abdominal:  Soft, NT, ND, bowel sounds + Extremities: no edema, no cyanosis    The results of significant diagnostics from this hospitalization (including imaging, microbiology, ancillary and laboratory) are listed below for reference.     Microbiology: Recent Results (from the past 240 hour(s))  Blood culture (routine x 2)     Status: None (Preliminary result)   Collection Time: 01/29/17  2:01 AM  Result Value Ref Range Status   Specimen Description BLOOD RIGHT FOREARM  Final   Special Requests   Final    BOTTLES DRAWN AEROBIC AND ANAEROBIC Blood Culture adequate volume   Culture NO GROWTH 2 DAYS  Final   Report Status PENDING  Incomplete  Blood culture (routine x 2)     Status: Abnormal   Collection Time: 01/29/17  2:07 AM  Result Value Ref Range Status   Specimen Description BLOOD LEFT ANTECUBITAL  Final   Special Requests   Final    BOTTLES DRAWN AEROBIC AND ANAEROBIC Blood Culture adequate volume   Culture  Setup Time   Final    GRAM POSITIVE COCCI IN CHAINS ANAEROBIC BOTTLE ONLY CRITICAL RESULT CALLED TO, READ BACK BY AND VERIFIED WITH: J. CARNEY,PHARMD 1902 01/29/2017 T. TYSOR    Culture ENTEROCOCCUS FAECALIS (A)  Final   Report Status 01/31/2017 FINAL  Final   Organism ID, Bacteria ENTEROCOCCUS FAECALIS  Final      Susceptibility   Enterococcus faecalis - MIC*    AMPICILLIN <=2 SENSITIVE Sensitive     VANCOMYCIN 1 SENSITIVE Sensitive     GENTAMICIN SYNERGY SENSITIVE Sensitive     * ENTEROCOCCUS FAECALIS     Labs: BNP (last 3 results)  Recent Labs  05/25/16 1558 09/29/16 1037 01/29/17 0544  BNP 15.5 25.4 94.8   Basic Metabolic Panel:  Recent Labs Lab 01/29/17 0037 01/30/17 0653 01/31/17 0406 02/01/17 0547  NA 137 138 137 138  K 3.7 2.8* 3.0* 3.7  CL 97* 91* 91* 94*  CO2 28 36* 35* 34*  GLUCOSE 146* 137* 151* 159*  BUN 20 8 10 12   CREATININE 1.28* 0.76 0.89 0.82  CALCIUM 8.8* 9.0 8.8* 9.2   Liver Function Tests: No results for input(s): AST, ALT, ALKPHOS,  BILITOT, PROT, ALBUMIN in the last 168 hours. No results for input(s): LIPASE, AMYLASE in the last 168 hours. No results for input(s): AMMONIA in the last 168 hours. CBC:  Recent Labs Lab 01/29/17 0037 01/29/17 0544 01/29/17 1656 01/30/17 0653 01/31/17 0406 02/01/17 0547  WBC 14.4* 12.1* 13.5* 11.4* 11.2* 10.8*  NEUTROABS 11.4*  --   --   --  8.3* 7.9*  HGB 7.8* 8.1* 8.5* 9.3* 9.1* 8.9*  HCT 27.4* 28.4* 29.1* 31.6* 30.8* 30.8*  MCV 78.5 79.3 78.9 78.6 78.2 78.2  PLT 333 289 284 302 290 280   Cardiac Enzymes: No results for input(s): CKTOTAL, CKMB, CKMBINDEX, TROPONINI in the last 168 hours. BNP: Invalid input(s): POCBNP CBG:  Recent Labs Lab 01/31/17 0714 01/31/17 1131 01/31/17 1707 01/31/17 2135 02/01/17 0805  GLUCAP 159* 146* 184* 157* 219*   D-Dimer No results for input(s): DDIMER in the last 72 hours. Hgb A1c No results for input(s): HGBA1C in the last 72 hours. Lipid Profile No results for input(s): CHOL, HDL, LDLCALC, TRIG, CHOLHDL, LDLDIRECT in the last 72 hours. Thyroid function studies No results for input(s): TSH, T4TOTAL, T3FREE, THYROIDAB in the last 72 hours.  Invalid input(s): FREET3 Anemia work up No results for input(s): VITAMINB12, FOLATE, FERRITIN, TIBC, IRON, RETICCTPCT in the last 72 hours. Urinalysis    Component Value Date/Time   COLORURINE YELLOW 06/26/2016 1706   APPEARANCEUR CLEAR 06/26/2016 1706   LABSPEC 1.015 06/26/2016 1706   PHURINE 6.0 06/26/2016 1706   GLUCOSEU NEGATIVE 06/26/2016 1706   HGBUR NEGATIVE 06/26/2016 1706   BILIRUBINUR NEGATIVE 06/26/2016 1706   KETONESUR NEGATIVE 06/26/2016 1706   PROTEINUR NEGATIVE 05/25/2016 0205   UROBILINOGEN 0.2 06/26/2016 1706   NITRITE NEGATIVE 06/26/2016 1706   LEUKOCYTESUR NEGATIVE 06/26/2016 1706   Sepsis Labs Invalid input(s): PROCALCITONIN,  WBC,  LACTICIDVEN Microbiology Recent Results (from the past 240 hour(s))  Blood culture (routine x 2)     Status: None (Preliminary  result)   Collection Time: 01/29/17  2:01 AM  Result Value Ref Range Status   Specimen Description BLOOD RIGHT FOREARM  Final   Special Requests   Final    BOTTLES DRAWN AEROBIC AND ANAEROBIC Blood Culture adequate volume   Culture NO GROWTH 2 DAYS  Final   Report Status PENDING  Incomplete  Blood culture (routine x 2)     Status: Abnormal   Collection Time: 01/29/17  2:07 AM  Result Value Ref Range Status   Specimen Description BLOOD LEFT ANTECUBITAL  Final   Special Requests   Final    BOTTLES DRAWN AEROBIC AND ANAEROBIC Blood Culture adequate volume   Culture  Setup Time   Final    GRAM POSITIVE COCCI IN CHAINS ANAEROBIC BOTTLE ONLY CRITICAL RESULT CALLED TO, READ BACK BY AND VERIFIED WITH: J. CARNEY,PHARMD 1902 01/29/2017 T. TYSOR    Culture ENTEROCOCCUS FAECALIS (A)  Final   Report Status 01/31/2017 FINAL  Final   Organism ID, Bacteria ENTEROCOCCUS FAECALIS  Final      Susceptibility   Enterococcus faecalis - MIC*    AMPICILLIN <=2 SENSITIVE Sensitive     VANCOMYCIN 1 SENSITIVE Sensitive     GENTAMICIN SYNERGY SENSITIVE Sensitive     * ENTEROCOCCUS FAECALIS     Time coordinating discharge: 45 minutes  SIGNED:   Tawni Millers, MD  [facility] 02/01/2017, 8:32 AM Pager [phone]  If 7PM-7AM, please contact night-coverage [email or web address] Password [email or web address]

## 2017-02-01 NOTE — Care Management Note (Signed)
Case Management Note  Patient Details  Name: Destiny Shelton MRN: [id number] Date of Birth: [birth year]  Subjective/Objective: 72 y.o. F who uses [facility] to provide her Home DME Oxygen. She tells me she uses 5L at rest and 8L when ambulating and has a tank to go home with today. No other needs at this time. Will need resumption of care orders.                    Action/Plan:CM will sign off for now but will be available should additional discharge needs arise or disposition change.    Expected Discharge Date:  02/01/17               Expected Discharge Plan:     In-House Referral:     Discharge planning Services  CM Consult  Post Acute Care Choice:  Durable Medical Equipment Choice offered to:  Patient  DME Arranged:  Oxygen DME Agency:  [place]:    [facility] Agency:     Status of Service:  Completed, signed off  If discussed at [place] of Stay Meetings, dates discussed:    Additional Comments:  Delrae Sawyers, RN 02/01/2017, 9:27 AM

## 2017-02-03 LAB — CULTURE, BLOOD (ROUTINE X 2)
Culture: NO GROWTH
Special Requests: ADEQUATE

## 2017-02-05 DIAGNOSIS — D62 Acute posthemorrhagic anemia: Secondary | ICD-10-CM | POA: Diagnosis not present

## 2017-02-05 DIAGNOSIS — E876 Hypokalemia: Secondary | ICD-10-CM | POA: Diagnosis not present

## 2017-02-05 LAB — CULTURE, BLOOD (ROUTINE X 2)
Culture: NO GROWTH
Culture: NO GROWTH
Special Requests: ADEQUATE
Special Requests: ADEQUATE

## 2017-02-10 DIAGNOSIS — R040 Epistaxis: Secondary | ICD-10-CM | POA: Diagnosis not present

## 2017-02-23 ENCOUNTER — Encounter: Payer: Self-pay | Admitting: Pulmonary Disease

## 2017-02-23 ENCOUNTER — Ambulatory Visit (INDEPENDENT_AMBULATORY_CARE_PROVIDER_SITE_OTHER): Payer: Medicare Other | Admitting: Pulmonary Disease

## 2017-02-23 VITALS — BP 136/66 | HR 119 | Ht 65.0 in | Wt 213.4 lb

## 2017-02-23 DIAGNOSIS — J849 Interstitial pulmonary disease, unspecified: Secondary | ICD-10-CM | POA: Diagnosis not present

## 2017-02-23 DIAGNOSIS — J9611 Chronic respiratory failure with hypoxia: Secondary | ICD-10-CM | POA: Diagnosis not present

## 2017-02-23 DIAGNOSIS — J441 Chronic obstructive pulmonary disease with (acute) exacerbation: Secondary | ICD-10-CM | POA: Diagnosis not present

## 2017-02-23 MED ORDER — LEVOFLOXACIN 750 MG PO TABS: 750.0000 mg | ORAL_TABLET | Freq: Every day | ORAL | 0 refills | Status: DC

## 2017-02-23 MED ORDER — AZITHROMYCIN 250 MG PO TABS: ORAL_TABLET | ORAL | 2 refills | Status: DC

## 2017-02-23 NOTE — Progress Notes (Signed)
Subjective:    Patient ID: Destiny Shelton, female    DOB: [birth year], 71 y.o.   MRN: [id number]  Synopsys: Former patient of Dr. Gwenette Greet with pulmonary hypertension, Afib, and COPD.  She smoked 2 packs per day for 25 years and quit in the 1990's.  She has been on oxygen since 2012.  As of 2017 She has been using 5L O2 at rest and 6 L with exertion.   She does not have a history of PE that she is aware of.    HPI Chief Complaint  Patient presents with  . Follow-up    c/o worsening sob with any exertion.     Destiny Shelton has been struggling with dyspnea since the last visit.  She says that just walking to the bathroom has been a making her more short of breath.  She is bringing up green mucus.  She says that they told her to stop the azithromycin but she doesn't know why they told he to do this.  She had her nose cauterized while hospitalized.  She has not had any fever or chills.  She feels like her body is hot all the time.    She has her O2 running at 5L [place] when she is sitting still, 8L at rest.  However her oxygen company changed the tanks to smaller ones recently so she can't leave the house without running out of oxygen.  She is not having more swelling.     Past Medical History:  Diagnosis Date  . Adenomatous colon polyp   . ALLERGIC RHINITIS   . Anemia    iron deficient  . Anxiety   . Atrial tachycardia (HCC)    Mostly Sinus Tachycardia  . Back pain, chronic   . Carotid stenosis   . CHF (congestive heart failure) ([place])   . Community acquired pneumonia - Recent Admission (07/2013) 07/08/2013  . COPD (chronic obstructive pulmonary disease) ([place])    Requiring Home O2 at 4L  . Depression   . Diabetes mellitus type 2 with neurological manifestations ([place])    On Insulin  . Diverticulosis of colon   . External hemorrhoids   . Fibromyalgia    "pain in arms and shoulders."  . Gastritis   . GERD (gastroesophageal reflux disease)   . Headache(784.0)    tension  . Hyperlipemia   .  Hypertension   . Inappropriate sinus tachycardia   . Morbidly obese ([place])   . Nephrolithiasis   . Neuromuscular disorder ([place])    diabetic neuropathy  . Osteoarthritis   . Osteoporosis   . PAF (paroxysmal atrial fibrillation) ([place])   . 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  . Sleep apnea    no cpap machine  02 at 4l/min all the time  . Urosepsis 05/26/2012      Review of Systems  Constitutional: Negative for chills, diaphoresis, fatigue and fever.  HENT: Negative for postnasal drip, rhinorrhea and sinus pressure.   Respiratory: Positive for cough, shortness of breath and wheezing.   Cardiovascular: Positive for leg swelling. Negative for chest pain and palpitations.       Objective:   Physical Exam Vitals:   02/23/17 1152  BP: 136/66  Pulse: (!) 119  SpO2: 93%  Weight: 213 lb 6.4 oz (96.8 kg)  Height: _0  (1.651 m)   5L > 85% 8L O2 > 93% Walking on 8L [place] > O2 saturation dropped to 77%   Gen: chronically ill appearing HENT: OP clear, TM's clear, neck supple PULM: Crackles  bases B, normal percussion CV: RRR, no mgr, trace edema GI: BS+, soft, nontender Derm: no cyanosis or rash Psyche: normal mood and affect      BMET    Component Value Date/Time   NA 138 02/01/2017 0547   K 3.7 02/01/2017 0547   CL 94 (L) 02/01/2017 0547   CO2 34 (H) 02/01/2017 0547   GLUCOSE 159 (H) 02/01/2017 0547   BUN 12 02/01/2017 0547   CREATININE 0.82 02/01/2017 0547   CREATININE 0.85 02/12/2015 0949   CALCIUM 9.2 02/01/2017 0547   GFRNONAA >60 02/01/2017 0547   GFRAA >60 02/01/2017 0547   Microbiology  BAL February 2018 Pseudomonas  PFT: PFT 2011:  FEV1 1.34 (55%), ratio 68, ++airtrapping on lung volumes, nl TLC, DLCO 40% pred. PFT's  03/23/2015  FEV1 1.61 (68 % ) ratio 69  p no % improvement from saba on   Symb/spiriva January 2018 pulmonary function testing ratio 70%, FEV1 1.63 L 70% predicted, FVC 2.33 L 76% predicted, total lung capacity 4.48 L 86% predicted, DLCO 5. 09/06/2019  percent predicted  Heart Cath: LHC 12/2011: non-obstructive cad RHC 12/2011:  PA 45/29 with mean 27m, PCWP 22, CO Fick 10.25, PVR 1.17 wood units, slight step up in saturation from   RV to PA 07/04/2015 RHC: RA mean 9 RV 45/10 PA 41/20, mean 30 PCWP mean 13  Oxygen saturations: SVC 68% RA 65% RV 61% PA 69% LA 98% Peripheral sat 93%  Cardiac Output (Fick) 6.7  Cardiac Index (Fick) 3.3 PVR 2.5 WU Qp/Qs = 0.97 .  Imaging: CT chest 12/2011:  No PE, very mild mosaic perfusion abnl (prob secondary to her known airflow obstruction).  January 2018 high-resolution CT scan of the chest shows air trapping, irregular distribution upper lobe fibrotic changes (interlobular septal thickening, some patchy ground glass) in a patchy distribution not consistent with usual interstitial pneumonitis  Cardiac imaging: Myoview 2012: no ischemia Echo 2012: nl LV and EF, impaired relaxation, nothing to suggest pulm htn. Echo 75/88/5027  Nl LV, diastolic dysfxn, normal RV, estimated RVSP 46 TEE 03/2012:  A few late bubbles seen felt c/w small IP shunting.  Cardiac MRI 2013:  No infiltrative process.    Records from her September 2018 hospitalization reviewed, she was admitted for a COPD exacerbation and epistaxis with blood loss anemia.     Assessment & Plan:    No diagnosis found.   COPD exacerbation Chronic respiratory failure with hypoxemia Chronic colonization with pseudomonas Diastolic heart failure  Discussion: Unfortunately Destiny Shelton struggling with another exacerbation of COPD. She has chronic colonization with Pseudomonas and we had been treating her with daily azithromycin but somehow this was stopped after her most recent hospitalization for A. fib and epistaxis. She has been producing more green mucus lately so we need to treat this. In addition her home oxygen company recently changed the size of tanks that they gave her for portable use and she is unable to leave the house for very long because she  needs such high levels of oxygen.  She is not having more swelling so I think her heart failure under control today.  Plan: For COPD with acute exacerbation and history of chronic colonization from Pseudomonas: Take Levaquin 750 mg daily for the next 7 days After you complete Levaquin take azithromycin 250 mg daily Use hypertonic saline twice a day for the next 3-5 days Continue taking guaifenesin over-the-counter Continue taking Symbicort and Spiriva  For chronic respiratory failure with hypoxemia: We will call advanced  home care to have them give you bigger tanks for when you leave the house Use 5 L of oxygen at rest, 8 L with exertion  For heart failure: Continue Lasix as you're doing Weight daily  We will see you back in one week with the nurse practitioner to make sure you are getting better    Current Outpatient Prescriptions:  .  acetaminophen (TYLENOL) 500 MG tablet, Take 1 tablet (500 mg total) by mouth every 6 (six) hours as needed for mild pain or headache., Disp: 30 tablet, Rfl: 0 .  albuterol (PROVENTIL) (2.5 MG/3ML) 0.083% nebulizer solution, Take 3 mLs (2.5 mg total) by nebulization every 6 (six) hours as needed for wheezing or shortness of breath., Disp: 75 mL, Rfl: 3 .  ALPRAZolam (XANAX) 0.25 MG tablet, Take 0.25 mg by mouth at bedtime as needed for anxiety. , Disp: , Rfl:  .  aspirin EC 81 MG EC tablet, Take 1 tablet (81 mg total) by mouth daily., Disp: 30 tablet, Rfl: 0 .  atorvastatin (LIPITOR) 40 MG tablet, Take 40 mg by mouth at bedtime. , Disp: , Rfl:  .  benzonatate (TESSALON) 200 MG capsule, Take 200 mg by mouth at bedtime. , Disp: , Rfl:  .  buPROPion (WELLBUTRIN XL) 300 MG 24 hr tablet, Take 300 mg by mouth daily., Disp: , Rfl:  .  DULoxetine (CYMBALTA) 60 MG capsule, Take 60 mg by mouth daily. , Disp: , Rfl:  .  furosemide (LASIX) 40 MG tablet, Take 2 tablets (80 mg total) by mouth 2 (two) times daily., Disp: 120 tablet, Rfl: 3 .  gabapentin (NEURONTIN)  600 MG tablet, Take 600 mg by mouth 3 (three) times daily. , Disp: , Rfl:  .  Guaifenesin (MUCINEX MAXIMUM STRENGTH) 1200 MG TB12, Take 1,200 mg by mouth 2 (two) times daily as needed (cough / congestion). , Disp: , Rfl:  .  HYDROcodone-acetaminophen (NORCO/VICODIN) 5-325 MG per tablet, Take 1 tablet by mouth every 4 (four) hours as needed for moderate pain. Reported on 12/12/2015, Disp: , Rfl:  .  insulin glargine (LANTUS) 100 UNIT/ML injection, Inject 0.66 mLs (66 Units total) into the skin daily before breakfast. (Patient taking differently: Inject 64 Units into the skin daily before breakfast. ), Disp: , Rfl:  .  insulin lispro (HUMALOG) 100 UNIT/ML injection, Inject 6 Units into the skin See admin instructions. Inject 6 units subcutaneously twice daily (with breakfast and supper) if CBG >150, Disp: , Rfl:  .  irbesartan (AVAPRO) 150 MG tablet, Take 1 tablet (150 mg total) by mouth daily., Disp: 90 tablet, Rfl: 3 .  iron polysaccharides (NIFEREX) 150 MG capsule, Take 1 capsule (150 mg total) by mouth 2 (two) times daily., Disp: 60 capsule, Rfl: 0 .  levalbuterol (XOPENEX HFA) 45 MCG/ACT inhaler, Inhale 1-2 puffs into the lungs every 4 (four) hours as needed for wheezing. (Patient taking differently: Inhale 2 puffs into the lungs every 4 (four) hours as needed for wheezing or shortness of breath. ), Disp: 1 Inhaler, Rfl: 12 .  metolazone (ZAROXOLYN) 5 MG tablet, Take 5 mg by mouth 2 (two) times a week., Disp: , Rfl:  .  Multiple Vitamin (MULTIVITAMIN WITH MINERALS) TABS tablet, Take 1 tablet by mouth daily., Disp: , Rfl:  .  omeprazole (PRILOSEC) 20 MG capsule, Take 20 mg by mouth daily after supper. , Disp: , Rfl:  .  OXYGEN, Inhale 5-8 L into the lungs See admin instructions. Use 5L when sitting and 8 L when up  and moving, Disp: , Rfl:  .  polyethylene glycol (MIRALAX / GLYCOLAX) packet, Take 17 g by mouth daily. Mix in 8 oz liquid and drink, Disp: , Rfl:  .  polyvinyl alcohol (ARTIFICIAL TEARS) 1.4  % ophthalmic solution, Place 1 drop into both eyes 2 (two) times daily. , Disp: , Rfl:  .  potassium chloride (K-DUR,KLOR-CON) 10 MEQ tablet, Take 3 tablets (30 mEq total) by mouth 2 (two) times daily., Disp: 180 tablet, Rfl: 3 .  roflumilast (DALIRESP) 500 MCG TABS tablet, Take 500 mcg by mouth daily., Disp: , Rfl:  .  sodium chloride HYPERTONIC 3 % nebulizer solution, Take by nebulization 2 (two) times daily. (Patient taking differently: Take 4 mLs by nebulization 2 (two) times daily. ), Disp: 750 mL, Rfl: 11 .  SPIRIVA HANDIHALER 18 MCG inhalation capsule, INHALE THE CONTENTS OF 1 CAPSULE VIA HANDIHALER BY MOUTH EVERY MORNING, Disp: 30 capsule, Rfl: 11 .  SYMBICORT 160-4.5 MCG/ACT inhaler, INHALE 2 PUFFS BY MOUTH TWICE DAILY, Disp: 10.2 g, Rfl: 5 .  verapamil (VERELAN PM) 360 MG 24 hr capsule, TAKE ONE CAPSULE BY MOUTH DAILY (Patient taking differently: TAKE 330m BY MOUTH once DAILY), Disp: 90 capsule, Rfl: 0

## 2017-02-23 NOTE — Patient Instructions (Addendum)
For COPD with acute exacerbation and history of chronic colonization from Pseudomonas: Take Levaquin 750 mg daily for the next 7 days After you complete Levaquin take azithromycin 250 mg daily Use hypertonic saline twice a day for the next 3-5 days Continue taking guaifenesin over-the-counter Continue taking Symbicort and Spiriva  For chronic respiratory failure with hypoxemia: We will call advanced home care to have them give you bigger tanks for when you leave the house Use 5 L of oxygen at rest, 8 L with exertion  For heart failure: Continue Lasix as you're doing Weight daily  We will see you back in one week with the nurse practitioner to make sure you are getting better

## 2017-02-25 ENCOUNTER — Telehealth: Payer: Self-pay | Admitting: Pulmonary Disease

## 2017-02-25 DIAGNOSIS — M810 Age-related osteoporosis without current pathological fracture: Secondary | ICD-10-CM | POA: Diagnosis not present

## 2017-02-25 NOTE — Telephone Encounter (Signed)
[facility], was on hold for more than 37mins. Will call back later.

## 2017-03-02 ENCOUNTER — Encounter (HOSPITAL_COMMUNITY): Payer: Self-pay | Admitting: Physician Assistant

## 2017-03-02 ENCOUNTER — Observation Stay (HOSPITAL_COMMUNITY)
Admission: EM | Admit: 2017-03-02 | Discharge: 2017-03-02 | Disposition: A | Discharge status: Home / Self Care | Payer: Medicare Other | Attending: Family Medicine | Admitting: Family Medicine

## 2017-03-02 ENCOUNTER — Emergency Department (HOSPITAL_COMMUNITY): Payer: Medicare Other

## 2017-03-02 ENCOUNTER — Ambulatory Visit: Payer: Medicare Other | Admitting: Acute Care

## 2017-03-02 DIAGNOSIS — I130 Hypertensive heart and chronic kidney disease with heart failure and stage 1 through stage 4 chronic kidney disease, or unspecified chronic kidney disease: Secondary | ICD-10-CM | POA: Diagnosis not present

## 2017-03-02 DIAGNOSIS — Z7951 Long term (current) use of inhaled steroids: Secondary | ICD-10-CM | POA: Insufficient documentation

## 2017-03-02 DIAGNOSIS — G4730 Sleep apnea, unspecified: Secondary | ICD-10-CM | POA: Diagnosis not present

## 2017-03-02 DIAGNOSIS — Z87891 Personal history of nicotine dependence: Secondary | ICD-10-CM | POA: Insufficient documentation

## 2017-03-02 DIAGNOSIS — A419 Sepsis, unspecified organism: Secondary | ICD-10-CM | POA: Diagnosis not present

## 2017-03-02 DIAGNOSIS — R000 Tachycardia, unspecified: Secondary | ICD-10-CM | POA: Diagnosis not present

## 2017-03-02 DIAGNOSIS — F419 Anxiety disorder, unspecified: Secondary | ICD-10-CM | POA: Diagnosis not present

## 2017-03-02 DIAGNOSIS — J449 Chronic obstructive pulmonary disease, unspecified: Secondary | ICD-10-CM | POA: Diagnosis not present

## 2017-03-02 DIAGNOSIS — D62 Acute posthemorrhagic anemia: Secondary | ICD-10-CM | POA: Diagnosis not present

## 2017-03-02 DIAGNOSIS — Z794 Long term (current) use of insulin: Secondary | ICD-10-CM | POA: Diagnosis not present

## 2017-03-02 DIAGNOSIS — E119 Type 2 diabetes mellitus without complications: Secondary | ICD-10-CM | POA: Diagnosis not present

## 2017-03-02 DIAGNOSIS — F32A Depression, unspecified: Secondary | ICD-10-CM | POA: Diagnosis present

## 2017-03-02 DIAGNOSIS — E1122 Type 2 diabetes mellitus with diabetic chronic kidney disease: Secondary | ICD-10-CM | POA: Diagnosis not present

## 2017-03-02 DIAGNOSIS — I10 Essential (primary) hypertension: Secondary | ICD-10-CM | POA: Diagnosis not present

## 2017-03-02 DIAGNOSIS — K219 Gastro-esophageal reflux disease without esophagitis: Secondary | ICD-10-CM | POA: Diagnosis not present

## 2017-03-02 DIAGNOSIS — J441 Chronic obstructive pulmonary disease with (acute) exacerbation: Secondary | ICD-10-CM | POA: Insufficient documentation

## 2017-03-02 DIAGNOSIS — N183 Chronic kidney disease, stage 3 (moderate): Secondary | ICD-10-CM | POA: Diagnosis not present

## 2017-03-02 DIAGNOSIS — F329 Major depressive disorder, single episode, unspecified: Secondary | ICD-10-CM | POA: Diagnosis not present

## 2017-03-02 DIAGNOSIS — Z860100 Personal history of colon polyps, unspecified: Secondary | ICD-10-CM

## 2017-03-02 DIAGNOSIS — E785 Hyperlipidemia, unspecified: Secondary | ICD-10-CM | POA: Diagnosis not present

## 2017-03-02 DIAGNOSIS — E1149 Type 2 diabetes mellitus with other diabetic neurological complication: Secondary | ICD-10-CM | POA: Diagnosis not present

## 2017-03-02 DIAGNOSIS — Z9981 Dependence on supplemental oxygen: Secondary | ICD-10-CM | POA: Insufficient documentation

## 2017-03-02 DIAGNOSIS — Z7901 Long term (current) use of anticoagulants: Secondary | ICD-10-CM | POA: Diagnosis not present

## 2017-03-02 DIAGNOSIS — E876 Hypokalemia: Secondary | ICD-10-CM | POA: Diagnosis not present

## 2017-03-02 DIAGNOSIS — R0602 Shortness of breath: Secondary | ICD-10-CM | POA: Diagnosis not present

## 2017-03-02 DIAGNOSIS — Z79899 Other long term (current) drug therapy: Secondary | ICD-10-CM | POA: Insufficient documentation

## 2017-03-02 DIAGNOSIS — I272 Pulmonary hypertension, unspecified: Secondary | ICD-10-CM | POA: Diagnosis present

## 2017-03-02 DIAGNOSIS — E1142 Type 2 diabetes mellitus with diabetic polyneuropathy: Secondary | ICD-10-CM | POA: Insufficient documentation

## 2017-03-02 DIAGNOSIS — R918 Other nonspecific abnormal finding of lung field: Secondary | ICD-10-CM | POA: Diagnosis present

## 2017-03-02 DIAGNOSIS — I480 Paroxysmal atrial fibrillation: Secondary | ICD-10-CM | POA: Diagnosis not present

## 2017-03-02 DIAGNOSIS — F411 Generalized anxiety disorder: Secondary | ICD-10-CM | POA: Diagnosis present

## 2017-03-02 DIAGNOSIS — R040 Epistaxis: Secondary | ICD-10-CM | POA: Diagnosis not present

## 2017-03-02 DIAGNOSIS — Z96651 Presence of right artificial knee joint: Secondary | ICD-10-CM | POA: Diagnosis not present

## 2017-03-02 DIAGNOSIS — D649 Anemia, unspecified: Secondary | ICD-10-CM | POA: Diagnosis present

## 2017-03-02 DIAGNOSIS — I4891 Unspecified atrial fibrillation: Secondary | ICD-10-CM | POA: Diagnosis not present

## 2017-03-02 DIAGNOSIS — J9601 Acute respiratory failure with hypoxia: Secondary | ICD-10-CM | POA: Diagnosis not present

## 2017-03-02 DIAGNOSIS — I5032 Chronic diastolic (congestive) heart failure: Secondary | ICD-10-CM | POA: Diagnosis not present

## 2017-03-02 DIAGNOSIS — Z6834 Body mass index (BMI) 34.0-34.9, adult: Secondary | ICD-10-CM | POA: Diagnosis not present

## 2017-03-02 DIAGNOSIS — M810 Age-related osteoporosis without current pathological fracture: Secondary | ICD-10-CM | POA: Diagnosis present

## 2017-03-02 DIAGNOSIS — M1990 Unspecified osteoarthritis, unspecified site: Secondary | ICD-10-CM | POA: Diagnosis present

## 2017-03-02 LAB — CBC WITH DIFFERENTIAL/PLATELET
BASOS ABS: 0 10*3/uL (ref 0.0–0.1)
BASOS PCT: 0 %
EOS ABS: 0 10*3/uL (ref 0.0–0.7)
Eosinophils Relative: 0 %
HCT: 28.2 % — ABNORMAL LOW (ref 36.0–46.0)
HEMOGLOBIN: 8.1 g/dL — AB (ref 12.0–15.0)
Lymphocytes Relative: 9 %
Lymphs Abs: 1.3 10*3/uL (ref 0.7–4.0)
MCH: 21.8 pg — ABNORMAL LOW (ref 26.0–34.0)
MCHC: 28.7 g/dL — AB (ref 30.0–36.0)
MCV: 75.8 fL — ABNORMAL LOW (ref 78.0–100.0)
MONO ABS: 1 10*3/uL (ref 0.1–1.0)
MONOS PCT: 7 %
NEUTROS PCT: 84 %
Neutro Abs: 12.1 10*3/uL — ABNORMAL HIGH (ref 1.7–7.7)
Platelets: 318 10*3/uL (ref 150–400)
RBC: 3.72 MIL/uL — ABNORMAL LOW (ref 3.87–5.11)
RDW: 17.5 % — AB (ref 11.5–15.5)
WBC: 14.5 10*3/uL — ABNORMAL HIGH (ref 4.0–10.5)

## 2017-03-02 LAB — COMPREHENSIVE METABOLIC PANEL
ALT: 13 U/L — ABNORMAL LOW (ref 14–54)
ANION GAP: 10 (ref 5–15)
AST: 22 U/L (ref 15–41)
Albumin: 3.4 g/dL — ABNORMAL LOW (ref 3.5–5.0)
Alkaline Phosphatase: 57 U/L (ref 38–126)
BILIRUBIN TOTAL: 0.6 mg/dL (ref 0.3–1.2)
BUN: 27 mg/dL — ABNORMAL HIGH (ref 6–20)
CALCIUM: 8.8 mg/dL — AB (ref 8.9–10.3)
CO2: 29 mmol/L (ref 22–32)
CREATININE: 1.32 mg/dL — AB (ref 0.44–1.00)
Chloride: 95 mmol/L — ABNORMAL LOW (ref 101–111)
GFR, EST AFRICAN AMERICAN: 45 mL/min — AB (ref >60)
GFR, EST NON AFRICAN AMERICAN: 39 mL/min — AB (ref >60)
Glucose, Bld: 139 mg/dL — ABNORMAL HIGH (ref 65–99)
Potassium: 3.8 mmol/L (ref 3.5–5.1)
Sodium: 134 mmol/L — ABNORMAL LOW (ref 135–145)
TOTAL PROTEIN: 6.1 g/dL — AB (ref 6.5–8.1)

## 2017-03-02 LAB — I-STAT TROPONIN, ED: TROPONIN I, POC: 0.02 ng/mL (ref 0.00–0.08)

## 2017-03-02 LAB — HEMOGLOBIN A1C
Hgb A1c MFr Bld: 5.8 % — ABNORMAL HIGH (ref 4.8–5.6)
MEAN PLASMA GLUCOSE: 119.76 mg/dL

## 2017-03-02 LAB — TYPE AND SCREEN
ABO/RH(D): A POS
Antibody Screen: NEGATIVE

## 2017-03-02 LAB — PROTIME-INR
INR: 1.35
PROTHROMBIN TIME: 16.6 s — AB (ref 11.4–15.2)

## 2017-03-02 LAB — I-STAT CG4 LACTIC ACID, ED: Lactic Acid, Venous: 1.11 mmol/L (ref 0.5–1.9)

## 2017-03-02 MED ORDER — INSULIN ASPART 100 UNIT/ML ~~LOC~~ SOLN: 0.0000 [IU] | Freq: Three times a day (TID) | SUBCUTANEOUS | Status: DC

## 2017-03-02 MED ORDER — VERAPAMIL HCL ER 360 MG PO CP24: 360.0000 mg | ORAL_CAPSULE | Freq: Every day | ORAL | Status: DC

## 2017-03-02 MED ORDER — ONDANSETRON HCL 4 MG PO TABS: 4.0000 mg | ORAL_TABLET | Freq: Four times a day (QID) | ORAL | Status: DC | PRN

## 2017-03-02 MED ORDER — BISACODYL 10 MG RE SUPP: 10.0000 mg | Freq: Every day | RECTAL | Status: DC | PRN

## 2017-03-02 MED ORDER — IRBESARTAN 150 MG PO TABS: 150.0000 mg | ORAL_TABLET | Freq: Every day | ORAL | Status: DC

## 2017-03-02 MED ORDER — POTASSIUM CHLORIDE CRYS ER 20 MEQ PO TBCR: 30.0000 meq | EXTENDED_RELEASE_TABLET | Freq: Two times a day (BID) | ORAL | Status: DC

## 2017-03-02 MED ORDER — SODIUM CHLORIDE 0.9 % IV BOLUS (SEPSIS): 500.0000 mL | Freq: Once | INTRAVENOUS | Status: DC

## 2017-03-02 MED ORDER — BENZONATATE 100 MG PO CAPS: 200.0000 mg | ORAL_CAPSULE | Freq: Every day | ORAL | Status: DC

## 2017-03-02 MED ORDER — ACETAMINOPHEN 650 MG RE SUPP: 650.0000 mg | Freq: Four times a day (QID) | RECTAL | Status: DC | PRN

## 2017-03-02 MED ORDER — FUROSEMIDE 20 MG PO TABS: 80.0000 mg | ORAL_TABLET | Freq: Two times a day (BID) | ORAL | Status: DC

## 2017-03-02 MED ORDER — ATORVASTATIN CALCIUM 40 MG PO TABS: 40.0000 mg | ORAL_TABLET | Freq: Every day | ORAL | Status: DC

## 2017-03-02 MED ORDER — ALPRAZOLAM 0.5 MG PO TABS: 0.2500 mg | ORAL_TABLET | Freq: Every evening | ORAL | Status: DC | PRN

## 2017-03-02 MED ORDER — HYDROCODONE-ACETAMINOPHEN 5-325 MG PO TABS: 1.0000 | ORAL_TABLET | ORAL | Status: DC | PRN

## 2017-03-02 MED ORDER — POLYETHYLENE GLYCOL 3350 17 G PO PACK: 17.0000 g | PACK | Freq: Every day | ORAL | Status: DC

## 2017-03-02 MED ORDER — PANTOPRAZOLE SODIUM 40 MG PO TBEC: 40.0000 mg | DELAYED_RELEASE_TABLET | Freq: Every day | ORAL | Status: DC

## 2017-03-02 MED ORDER — POLYSACCHARIDE IRON COMPLEX 150 MG PO CAPS: 150.0000 mg | ORAL_CAPSULE | Freq: Two times a day (BID) | ORAL | Status: DC

## 2017-03-02 MED ORDER — SODIUM CHLORIDE 0.9 % IV BOLUS (SEPSIS)
1000.0000 mL | Freq: Once | INTRAVENOUS | Status: AC
  Administered 2017-03-02: 1000 mL via INTRAVENOUS

## 2017-03-02 MED ORDER — SODIUM CHLORIDE 0.9 % IV SOLN: INTRAVENOUS | Status: DC

## 2017-03-02 MED ORDER — GUAIFENESIN ER 1200 MG PO TB12: 1200.0000 mg | ORAL_TABLET | Freq: Two times a day (BID) | ORAL | Status: DC | PRN

## 2017-03-02 MED ORDER — ONDANSETRON HCL 4 MG/2ML IJ SOLN: 4.0000 mg | Freq: Four times a day (QID) | INTRAMUSCULAR | Status: DC | PRN

## 2017-03-02 MED ORDER — ROFLUMILAST 500 MCG PO TABS: 500.0000 ug | ORAL_TABLET | Freq: Every day | ORAL | Status: DC

## 2017-03-02 MED ORDER — BUPROPION HCL ER (XL) 150 MG PO TB24
300.0000 mg | ORAL_TABLET | Freq: Every day | ORAL | Status: DC
Start: 2017-03-03 — End: 2017-03-02

## 2017-03-02 MED ORDER — GABAPENTIN 600 MG PO TABS: 600.0000 mg | ORAL_TABLET | Freq: Three times a day (TID) | ORAL | Status: DC

## 2017-03-02 MED ORDER — INSULIN GLARGINE 100 UNIT/ML ~~LOC~~ SOLN: 64.0000 [IU] | Freq: Every day | SUBCUTANEOUS | Status: DC

## 2017-03-02 MED ORDER — SENNOSIDES-DOCUSATE SODIUM 8.6-50 MG PO TABS: 1.0000 | ORAL_TABLET | Freq: Every evening | ORAL | Status: DC | PRN

## 2017-03-02 MED ORDER — DULOXETINE HCL 60 MG PO CPEP: 60.0000 mg | ORAL_CAPSULE | Freq: Every day | ORAL | Status: DC

## 2017-03-02 MED ORDER — ALBUTEROL SULFATE (2.5 MG/3ML) 0.083% IN NEBU: 2.5000 mg | INHALATION_SOLUTION | Freq: Four times a day (QID) | RESPIRATORY_TRACT | Status: DC | PRN

## 2017-03-02 MED ORDER — ACETAMINOPHEN 325 MG PO TABS: 650.0000 mg | ORAL_TABLET | Freq: Four times a day (QID) | ORAL | Status: DC | PRN

## 2017-03-02 NOTE — ED Notes (Signed)
Nose continuing to bleed. EDP notified.

## 2017-03-02 NOTE — ED Provider Notes (Signed)
[facility] DEPT Provider Note   CSN: [id number] Arrival date & time: 03/02/17  1112     History   Chief Complaint Chief Complaint  Patient presents with  . Epistaxis    HPI Destiny Shelton is a 71 y.o. female.  HPI  Destiny Shelton is a 71yo female with a history of afib (on Xarelto), CHF, COPD (on 5L of home oxygen at rest, 8L with walking), IDDM, HTN who presents to the Emergency Department for evaluation of epistaxis which began about 9AM this morning. Patient states that she has a history of nose bleeds while being on Xarelto. Her last nose bleed was about a month ago where she was admitted for her bleeding, she did not get a transfusion during her hospital stay. She states that she was taken off Xarelto for a period of time after the admission, but restarted about a week ago. After admission she went to ENT doctor who cauterized several vessles in her nasal cavity.   Today patient states that she tried to stop the bleeding with direct pressure and with tilting her head back, but it did not improve and ultimately she called EMS. She is not bleeding now, states that it "comes and goes." She denies hematuria, hematochezia, melena. Denies worsening of her chronic SOB, denies chest pain.   Past Medical History:  Diagnosis Date  . Adenomatous colon polyp   . ALLERGIC RHINITIS   . Anemia    iron deficient  . Anxiety   . Atrial tachycardia (HCC)    Mostly Sinus Tachycardia  . Back pain, chronic   . Carotid stenosis   . CHF (congestive heart failure) ([place])   . Community acquired pneumonia - Recent Admission (07/2013) 07/08/2013  . COPD (chronic obstructive pulmonary disease) ([place])    Requiring Home O2 at 4L  . Depression   . Diabetes mellitus type 2 with neurological manifestations ([place])    On Insulin  . Diverticulosis of colon   . External hemorrhoids   . Fibromyalgia    "pain in arms and shoulders."  . Gastritis   . GERD (gastroesophageal reflux disease)   .  Headache(784.0)    tension  . Hyperlipemia   . Hypertension   . Inappropriate sinus tachycardia   . Morbidly obese ([place])   . Nephrolithiasis   . Neuromuscular disorder ([place])    diabetic neuropathy  . Osteoarthritis   . Osteoporosis   . PAF (paroxysmal atrial fibrillation) ([place])   . 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  . Sleep apnea    no cpap machine  02 at 4l/min all the time  . Urosepsis 05/26/2012    Patient Active Problem List   Diagnosis Date Noted  . Epistaxis 03/02/2017  . Enterococcal bacteremia 01/30/2017  . CAP (community acquired pneumonia) 01/29/2017  . Sepsis ([place]) 01/29/2017  . Bleeding from the nose 01/29/2017  . ILD (interstitial lung disease) ([place]) 06/26/2016  . Fall   . AKI (acute kidney injury) ([place]) 05/25/2016  . Acute on chronic respiratory failure with hypoxia ([place]) 05/25/2016  . Hyponatremia 05/25/2016  . Metabolic acidosis 93/81/0175  . Acute hip pain, left 05/25/2016  . Acute renal failure (ARF) ([place]) 05/25/2016  . COPD (chronic obstructive pulmonary disease) ([place]) 05/25/2016  . COPD exacerbation ([place]) 01/22/2016  . Biomechanical lesion of rib cage 01/22/2016  . Anemia 01/22/2016  . Chronic diastolic CHF (congestive heart failure) ([place]) 12/12/2015  . Morbid obesity ([place]) 12/29/2014  . Benign neoplasm of sigmoid colon   . Chronic diastolic heart failure of  unknown etiology ([place]) 08/17/2013  . Chronic respiratory failure ([place]) 04/21/2013  . Polyposis coli-attenuated 01/24/2013  . Hypokalemia 10/02/2012  . Sigmoid tubular adenoma with high-grade dysplasia 10/01/2012  . Gastric polyp 10/01/2012  . Schatzki's ring 10/01/2012  . PAF (paroxysmal atrial fibrillation) ([place]) 05/29/2012  . Pulmonary hypertension ([place]) 04/15/2012  . Inappropriate sinus tachycardia   . Obesity, Class II, BMI 35-39.9, with comorbidity (HCC)     Class: Chronic  . History of failed moderate sedation 06/10/2011  . Pulmonary nodules 09/11/2010  . Centrilobular emphysema ([place])  09/11/2010  . CHRONIC DYSPNEA 07/26/2009  . Diabetes mellitus, type II, insulin dependent ([place]) 07/29/2007    Class: Diagnosis of  . DIABETIC PERIPHERAL NEUROPATHY 07/29/2007  . Anxiety state 07/29/2007  . Depression 07/29/2007  . Essential hypertension 07/29/2007  . NEPHROLITHIASIS 07/29/2007  . Osteoarthritis 07/29/2007  . Backache 07/29/2007  . Osteoporosis 07/29/2007  . Personal history of colonic polyps 04/28/2007    Past Surgical History:  Procedure Laterality Date  . ABDOMINAL HYSTERECTOMY  1978  . APPENDECTOMY  1963  . CARDIAC CATHETERIZATION N/A 07/04/2015   Procedure: Right Heart Cath;  Surgeon: Larey Dresser, MD;  Location: [facility];  Service: Cardiovascular;  Laterality: N/A;  . CHOLECYSTECTOMY  1963  . COLONOSCOPY N/A 12/21/2012   Procedure: COLONOSCOPY;  Surgeon: Gatha Mayer, MD;  Location: [facility];  Service: Endoscopy;  Laterality: N/A;  . COLONOSCOPY N/A 11/17/2013   Procedure: COLONOSCOPY;  Surgeon: Gatha Mayer, MD;  Location: [facility];  Service: Endoscopy;  Laterality: N/A;  . COLONOSCOPY W/ BIOPSIES AND POLYPECTOMY  07/22/2007, 04/28/2007   2009: diverticulosis, 2008: diverticulosis, adenomatous polyps  . COLONOSCOPY WITH PROPOFOL N/A 10/01/2012   Procedure: COLONOSCOPY WITH PROPOFOL;  Surgeon: Gatha Mayer, MD;  Location: [facility];  Service: Endoscopy;  Laterality: N/A;  . COLONOSCOPY WITH PROPOFOL N/A 10/10/2014   Procedure: COLONOSCOPY WITH PROPOFOL;  Surgeon: Gatha Mayer, MD;  Location: [facility];  Service: Endoscopy;  Laterality: N/A;  . ESOPHAGOGASTRODUODENOSCOPY (EGD) WITH PROPOFOL N/A 10/01/2012   Procedure: ESOPHAGOGASTRODUODENOSCOPY (EGD) WITH PROPOFOL;  Surgeon: Gatha Mayer, MD;  Location: [facility];  Service: Endoscopy;  Laterality: N/A;  . HOT HEMOSTASIS N/A 12/21/2012   Procedure: HOT HEMOSTASIS (ARGON PLASMA COAGULATION/BICAP);  Surgeon: Gatha Mayer, MD;  Location: [facility];  Service: Endoscopy;  Laterality:  N/A;  . HOT HEMOSTASIS N/A 10/10/2014   Procedure: HOT HEMOSTASIS (ARGON PLASMA COAGULATION/BICAP);  Surgeon: Gatha Mayer, MD;  Location: [facility];  Service: Endoscopy;  Laterality: N/A;  . LEFT AND RIGHT HEART CATHETERIZATION WITH CORONARY ANGIOGRAM  12/19/2011   Procedure: LEFT AND RIGHT HEART CATHETERIZATION WITH CORONARY ANGIOGRAM;  Surgeon: Leonie Man, MD;  Location: [facility] CATH LAB;  Service: Cardiovascular;;  . RIGHT AND LEFT CARDIAC CATHETERIZATION  July 2013   RAP: 22 mmHg, and RVP: 46/16 mmHg, RV EDP 18; L. BP 110/11 mmHg, LVEDP 20 mmHg;  PAP 45/29 mmHg (mean 37 mmHg); PCWP 29/22 mmHg - 26 mmHg; no significant coronary disease  . TEE WITHOUT CARDIOVERSION  03/24/2012   Procedure: TRANSESOPHAGEAL ECHOCARDIOGRAM (TEE);  Surgeon: Pixie Casino, MD;  Location: [facility];  Service: Cardiovascular;  Laterality: N/A;  TEE with [place]  . TOTAL KNEE ARTHROPLASTY  1997   right  . UPPER GASTROINTESTINAL ENDOSCOPY  04/09/2006   w/Dilation, gastritis  . VIDEO BRONCHOSCOPY Bilateral 07/22/2016   Procedure: VIDEO BRONCHOSCOPY WITH FLUORO;  Surgeon: Juanito Doom, MD;  Location: [facility];  Service: Cardiopulmonary;  Laterality: Bilateral;  OB History    No data available       Home Medications    Prior to Admission medications   Medication Sig Start Date End Date Taking? Authorizing Provider  acetaminophen (TYLENOL) 500 MG tablet Take 1 tablet (500 mg total) by mouth every 6 (six) hours as needed for mild pain or headache. 02/01/17  Yes Arrien, Jimmy Picket, MD  albuterol (PROVENTIL) (2.5 MG/3ML) 0.083% nebulizer solution Take 3 mLs (2.5 mg total) by nebulization every 6 (six) hours as needed for wheezing or shortness of breath. 06/09/16  Yes Magdalen Spatz, NP  ALPRAZolam Duanne Moron) 0.5 MG tablet Take 0.25-0.5 mg by mouth daily as needed for anxiety.  02/06/17  Yes [provider]  atorvastatin (LIPITOR) 40 MG tablet Take 40 mg by mouth at bedtime.  06/06/13  Yes [provider]  azithromycin (ZITHROMAX) 250 MG tablet 1 tab daily.  Start after finishing Levaquin. Patient taking differently: Take 250 mg by mouth daily.  02/23/17  Yes Juanito Doom, MD  benzonatate (TESSALON) 200 MG capsule Take 400 mg by mouth at bedtime.    Yes [provider]  buPROPion (WELLBUTRIN XL) 300 MG 24 hr tablet Take 300 mg by mouth daily. 03/03/16  Yes [provider]  DULoxetine (CYMBALTA) 60 MG capsule Take 60 mg by mouth daily.    Yes [provider]  furosemide (LASIX) 40 MG tablet Take 2 tablets (80 mg total) by mouth 2 (two) times daily. 01/16/17  Yes Larey Dresser, MD  gabapentin (NEURONTIN) 600 MG tablet Take 600 mg by mouth 3 (three) times daily.    Yes [provider]  Guaifenesin (MUCINEX MAXIMUM STRENGTH) 1200 MG TB12 Take 1,200 mg by mouth 2 (two) times daily as needed (for cough or congestion).    Yes [provider]  HYDROcodone-acetaminophen (NORCO/VICODIN) 5-325 MG per tablet Take 1 tablet by mouth See admin instructions. EVERY 4-6 HOURS AS NEEDED FOR PAIN OR SHORTNESS OF BREATH   Yes [provider]  insulin aspart (NOVOLOG FLEXPEN) 100 UNIT/ML FlexPen Inject 5 Units into the skin 3 (three) times daily as needed for high blood sugar.   Yes [provider]  insulin glargine (LANTUS) 100 UNIT/ML injection Inject 0.66 mLs (66 Units total) into the skin daily before breakfast. Patient taking differently: Inject 64 Units into the skin daily before breakfast.  01/24/16  Yes Short, Noah Delaine, MD  irbesartan (AVAPRO) 150 MG tablet Take 1 tablet (150 mg total) by mouth daily. 12/31/16  Yes Larey Dresser, MD  iron polysaccharides (NIFEREX) 150 MG capsule Take 1 capsule (150 mg total) by mouth 2 (two) times daily. Patient taking differently: Take 150 mg by mouth daily.  01/24/16  Yes Short, Noah Delaine, MD  levalbuterol [facility] HFA) 45 MCG/ACT inhaler Inhale 1-2 puffs into the lungs every 4 (four) hours as  needed for wheezing. Patient taking differently: Inhale 2 puffs into the lungs every 4 (four) hours as needed for wheezing or shortness of breath.  06/01/12  Yes Marton Redwood, MD  metolazone (ZAROXOLYN) 5 MG tablet Take 5 mg by mouth 2 (two) times a week. Wednesday and Friday   Yes [provider]  Multiple Vitamin (MULTIVITAMIN WITH MINERALS) TABS tablet Take 1 tablet by mouth daily.   Yes [provider]  omeprazole (PRILOSEC OTC) 20 MG tablet Take 20 mg by mouth at bedtime.   Yes [provider]  OXYGEN Inhale 5-8 L into the lungs See admin instructions. 5 liters when at  rest and 8 liters when ambulatory   Yes [provider]  polyethylene glycol (MIRALAX / GLYCOLAX) packet Take 17 g by mouth daily. Mix in 8 oz liquid and drink   Yes [provider]  polyvinyl alcohol (ARTIFICIAL TEARS) 1.4 % ophthalmic solution Place 1 drop into both eyes 2 (two) times daily.    Yes [provider]  potassium chloride (K-DUR,KLOR-CON) 10 MEQ tablet Take 3 tablets (30 mEq total) by mouth 2 (two) times daily. 04/18/16  Yes Larey Dresser, MD  roflumilast (DALIRESP) 500 MCG TABS tablet Take 500 mcg by mouth daily.   Yes [provider]  SPIRIVA HANDIHALER 18 MCG inhalation capsule INHALE THE CONTENTS OF 1 CAPSULE VIA HANDIHALER BY MOUTH EVERY MORNING 11/03/16  Yes Juanito Doom, MD  SYMBICORT 160-4.5 MCG/ACT inhaler INHALE 2 PUFFS BY MOUTH TWICE DAILY Patient taking differently: Inhale 2 puffs into the lungs two times a day 01/27/17  Yes McQuaid, Ronie Spies, MD  verapamil (VERELAN PM) 360 MG 24 hr capsule TAKE ONE CAPSULE BY MOUTH DAILY Patient taking differently: Take 360 mg by mouth once a day 12/08/16  Yes Leonie Man, MD  XARELTO 20 MG TABS tablet Take 20 mg by mouth daily with supper. 02/22/17  Yes [provider]  aspirin EC 81 MG EC tablet Take 1 tablet (81 mg total) by mouth daily. Patient not taking: Reported on 03/02/2017 02/01/17  03/03/17  Arrien, Jimmy Picket, MD  levofloxacin (LEVAQUIN) 750 MG tablet Take 1 tablet (750 mg total) by mouth daily. Patient not taking: Reported on 03/02/2017 02/23/17   Simonne Maffucci B, MD  sodium chloride HYPERTONIC 3 % nebulizer solution Take by nebulization 2 (two) times daily. Patient not taking: Reported on 03/02/2017 11/25/16   Juanito Doom, MD    Family History Family History  Problem Relation Age of Onset  . Heart disease Mother   . Stroke Mother   . Heart disease Father   . Heart disease Brother   . Stroke Brother   . Skin cancer Brother   . Colon polyps Sister   . Diabetes Sister   . Diabetes Brother   . Irritable bowel syndrome Daughter   . Colon cancer Neg Hx     Social History Social History  Substance Use Topics  . Smoking status: Former Smoker    Packs/day: 2.00    Years: 30.00    Types: Cigarettes    Quit date: 06/02/1994  . Smokeless tobacco: Never Used  . Alcohol use No     Allergies   Aspirin and Nsaids   Review of Systems Review of Systems  Constitutional: Negative for chills, fatigue and fever.  HENT: Positive for nosebleeds.   Eyes: Negative for visual disturbance.  Respiratory: Negative for cough, shortness of breath and wheezing.   Cardiovascular: Negative for chest pain and leg swelling.  Gastrointestinal: Negative for abdominal pain, blood in stool, diarrhea, nausea and vomiting.  Genitourinary: Negative for difficulty urinating and hematuria.  Musculoskeletal: Negative for gait problem.  Skin: Negative for pallor, rash and wound.  Neurological: Negative for dizziness, weakness, light-headedness, numbness and headaches.  Hematological: Bruises/bleeds easily.  Psychiatric/Behavioral: Negative for agitation and confusion.     Physical Exam Updated Vital Signs BP 98/79   Pulse (!) 105   Temp 98.3 F (36.8 C) (Oral)   Resp 18   SpO2 97%   Physical Exam  Constitutional: She is oriented to person, place, and time. She  appears well-developed and well-nourished. No distress.  Patient is calm, answers questions appropriately.  HENT:  Head: Normocephalic and atraumatic.  No active bleeding from the nasal cavity. Dried blood located in bilateral nares, clotted. Clotted blood noted in the posterior oropharynx.  Eyes: Pupils are equal, round, and reactive to light. Conjunctivae are normal. Right eye exhibits no discharge. Left eye exhibits no discharge. No scleral icterus.  Neck: Normal range of motion. Neck supple. JVD present. No tracheal deviation present.  Cardiovascular: Normal rate and intact distal pulses.  Exam reveals no friction rub.   No murmur heard. Irregularly irregular rhythm  Pulmonary/Chest: Effort normal and breath sounds normal. No respiratory distress. She has no wheezes. She has no rales. She exhibits no tenderness.  Shallow inspiratory volume. Patient on 15L O2 delivered via mask.   Abdominal: Soft. Bowel sounds are normal. There is no tenderness. There is no guarding.  Musculoskeletal: Normal range of motion.  Lymphadenopathy:    She has no cervical adenopathy.  Neurological: She is alert and oriented to person, place, and time. Coordination normal.  Skin: Skin is warm and dry. Capillary refill takes less than 2 seconds. She is not diaphoretic.  Psychiatric: She has a normal mood and affect. Her behavior is normal.  Nursing note and vitals reviewed.    ED Treatments / Results  Labs (all labs ordered are listed, but only abnormal results are displayed) Labs Reviewed  COMPREHENSIVE METABOLIC PANEL - Abnormal; Notable for the following:       Result Value   Sodium 134 (*)    Chloride 95 (*)    Glucose, Bld 139 (*)    BUN 27 (*)    Creatinine, Ser 1.32 (*)    Calcium 8.8 (*)    Total Protein 6.1 (*)    Albumin 3.4 (*)    ALT 13 (*)    GFR calc non Af Amer 39 (*)    GFR calc Af Amer 45 (*)    All other components within normal limits  CBC WITH DIFFERENTIAL/PLATELET - Abnormal;  Notable for the following:    WBC 14.5 (*)    RBC 3.72 (*)    Hemoglobin 8.1 (*)    HCT 28.2 (*)    MCV 75.8 (*)    MCH 21.8 (*)    MCHC 28.7 (*)    RDW 17.5 (*)    Neutro Abs 12.1 (*)    All other components within normal limits  PROTIME-INR - Abnormal; Notable for the following:    Prothrombin Time 16.6 (*)    All other components within normal limits  HEMOGLOBIN A1C - Abnormal; Notable for the following:    Hgb A1c MFr Bld 5.8 (*)    All other components within normal limits  I-STAT CG4 LACTIC ACID, ED  I-STAT TROPONIN, ED  I-STAT CG4 LACTIC ACID, ED  TYPE AND SCREEN    EKG  EKG Interpretation None       Radiology Dg Chest Portable 1 View  Result Date: 03/02/2017 CLINICAL DATA:  Shortness of breath. EXAM: PORTABLE CHEST 1 VIEW COMPARISON:  Chest x-ray dated January 30, 2017. FINDINGS: The cardiomediastinal silhouette is stable. Normal pulmonary vascularity. Atherosclerotic calcification of the aortic arch. Bibasilar atelectasis/ scarring. No focal consolidation, pleural effusion, or pneumothorax. No acute osseous abnormality. IMPRESSION: Stable bibasilar atelectasis/scarring. Electronically Signed   By: Titus Dubin M.D.   On: 03/02/2017 13:01    Procedures Procedures (including critical care time)  Medications Ordered in ED Medications  sodium chloride 0.9 % bolus 1,000 mL (0 mLs Intravenous  Stopped 03/02/17 1344)     Initial Impression / Assessment and Plan / ED Course  I have reviewed the triage vital signs and the nursing notes.  Pertinent labs & imaging results that were available during my care of the patient were reviewed by me and considered in my medical decision making (see chart for details).  Clinical Course as of Mar 02 2256  Mon Mar 02, 2017  1343 Patients taken off non-rebreather and placed on 5L O2 which is her home dose. Hgb 8.1, will hold off on transfusion at this time. She has been typed and screened.   [ES]  1445 Afrin cotton ball placed  in nasal cavity. No bleeding. Patient on 5L O2 mask.   [ES]    Clinical Course User Index [ES] Glyn Ade, PA-C    Nose bleed was treated with cotton swab doused with afrin.  Labs reviewed. Patient's creatinine is bumped (1.32 today, 0.82 a month ago.) She was given 1L of fluids while in the ER. She has a WBC count of 14.5 and hgb 8.1. Held off on transfusing patient, as she was 8.1 on her last admission and did not get transfusion and she is on her home dose of oxygen with o2 sat 97%. Troponin negative, lactic acid normal. CXR without infiltrate.  Patient evaluated by hospitalist Dr. Marily Memos. States that patient can go home given that her bleeding has stopped, Hgb stable and comfortably breathing 5L O2 which is her home dose. Patient has a follow up appointment with ENT next week already scheduled. He recommends sending home on full dose aspirin, hold the Xarelto until she can see ENT next week where she will likely need cautery again.   Patient given supply of Afrin and cotton swabs. Have given extensive instructions to wet cotton swab with afrin and place in the nose should she have another nose bleed. Return precautions discussed including worsening shortness of breath with increased home oxygen requirements, bleeding that does not stop with pressure and afrin or for any other new or worsening symptoms. Patient counseled to also follow up with her primary care doctor regarding reinitiating of Xarelto. She and her husband voice understanding and agree at bedside. Patient was seen with Dr. Thomasene Lot who agrees with plan to discharge.      Final Clinical Impressions(s) / ED Diagnoses   Final diagnoses:  Epistaxis    New Prescriptions Discharge Medication List as of 03/02/2017  5:59 PM       Glyn Ade, PA-C 03/02/17 2305    Macarthur Critchley, MD 03/04/17 0006

## 2017-03-02 NOTE — ED Notes (Signed)
Pt using an external female catheter. 

## 2017-03-02 NOTE — Telephone Encounter (Signed)
Destiny Shelton, [facility] returned call and states patient is scheduled to be seen there tomorrow, 03/03/17.  If need to reach him, CB is [phone].

## 2017-03-02 NOTE — ED Triage Notes (Addendum)
Pt arrived via [facility] c/o of a nose bleed x3 hours. Pt has hx of such and has been cauterized in the past. Blood is bright red in color and pt has been spitting up clots since bleeding began. Pt is alert and oriented x4. Per EMS, [facility]SS en route. Bleeding slowed with Afrin, administered x2 by ems. Pt continuously on 5 LPM [place] at home. Pt is on blood thinners.

## 2017-03-02 NOTE — ED Notes (Signed)
Pt requesting IV to be removed. Advised pt that I would let her RN Lovena Le know when she is out of another room.

## 2017-03-02 NOTE — ED Notes (Signed)
D.c instructions reviewed with pt and husband, pt verbalized understanding of discharge instructions and follow up care, no further questions. Pt alert and ambulatory upon discharge

## 2017-03-02 NOTE — Discharge Instructions (Signed)
If you get a nose bleed again, place cotton ball doused with Afrin and stick it in the nose along with direct pressure. If this does not work and your nose continues to bleed, you need to come back to the ER.   Do not take your Xarelto until you follow up with your ENT doctor. Continue to take full dose of aspirin (325mg .)  Please keep your appointment for ENT on 03/11/2017 for follow up on your nose bleeds. You may need to get the vessels cauterized again. Please also schedule an appointment with your primary care doctor for follow up and counseling on whether you should restart your Xarelto.   Return to the ER if you develop worsening shortness of breath and require more than 5L of home oxygen. Please also return for worsening bleeding or new or worsening symptoms.

## 2017-03-02 NOTE — H&P (Signed)
[facility] Consultation     CAILAN ANTONUCCI VWU:[id number] DOB: [birth year] DOA: 03/02/2017   PCP: Marton Redwood, MD   Patient coming from:  Home    Chief Complaint: nosebleed   HPI: Destiny Shelton is a 71 y.o. female with medical history as listed below, including history of atrial fibrillation on Xarelto followed by Dr. Sallyanne Kuster, [facility], COPD on 5 L of home oxygen, diabetes, hypertension, presenting to the ED for evaluation of epistaxis, beginning on 9 AM today. She does have a history of nosebleeds while on Xarelto, last episode was 1 month ago, where she was admitted for blood transfusion, given the severity of these nose bleed. Once taken off Xarelto, she had no further episodes during that admission. However, she continued taking it and last dose was yesterday. Of note, she had been seen by ENT in the interim, who cauterized some blood vessels in her nasal cavity. She had tried to stop the bleeding with direct pressure and with tilting her head back, but did not improve and ultimately, called EMS. At this time she is not bleeding. She reports having an appointment with ENT next week.She reports using a humidifier. She denies hematuria, hematochezia or melena. She denies worsening of her chronic shortness of breath, or chest pain. She denies any headaches or vision changes. No nausea or vomiting.  ED Course:  BP (!) 114/44   Pulse (!) 107   Temp 98.3 F (36.8 C) (Oral)   Resp 20   SpO2 100%   Her nasal cavity was packed with direct control of her bleeding.  Placed on NRB, which was eventually discontinued at 2 PM today. She is on Ventimask  Creatinine 1.32 White count 14.5 hemoglobin 8.1 platelets 318 PT 16.6, INR 1.35 Chest x-ray stable by vascular atelectasis with scarring. Last 2-D echo to 2017 with normal systolic function, EF 29-56, grade 1 diastolic   Review of Systems:  As per HPI otherwise all other systems reviewed and are negative  Past Medical History:  Diagnosis  Date  . Adenomatous colon polyp   . ALLERGIC RHINITIS   . Anemia    iron deficient  . Anxiety   . Atrial tachycardia (HCC)    Mostly Sinus Tachycardia  . Back pain, chronic   . Carotid stenosis   . CHF (congestive heart failure) ([place])   . Community acquired pneumonia - Recent Admission (07/2013) 07/08/2013  . COPD (chronic obstructive pulmonary disease) ([place])    Requiring Home O2 at 4L  . Depression   . Diabetes mellitus type 2 with neurological manifestations ([place])    On Insulin  . Diverticulosis of colon   . External hemorrhoids   . Fibromyalgia    "pain in arms and shoulders."  . Gastritis   . GERD (gastroesophageal reflux disease)   . Headache(784.0)    tension  . Hyperlipemia   . Hypertension   . Inappropriate sinus tachycardia   . Morbidly obese ([place])   . Nephrolithiasis   . Neuromuscular disorder ([place])    diabetic neuropathy  . Osteoarthritis   . Osteoporosis   . PAF (paroxysmal atrial fibrillation) ([place])   . 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  . Sleep apnea    no cpap machine  02 at 4l/min all the time  . Urosepsis 05/26/2012    Past Surgical History:  Procedure Laterality Date  . ABDOMINAL HYSTERECTOMY  1978  . APPENDECTOMY  1963  . CARDIAC CATHETERIZATION N/A 07/04/2015   Procedure: Right Heart Cath;  Surgeon: Larey Dresser,  MD;  Location: [facility];  Service: Cardiovascular;  Laterality: N/A;  . CHOLECYSTECTOMY  1963  . COLONOSCOPY N/A 12/21/2012   Procedure: COLONOSCOPY;  Surgeon: Gatha Mayer, MD;  Location: [facility];  Service: Endoscopy;  Laterality: N/A;  . COLONOSCOPY N/A 11/17/2013   Procedure: COLONOSCOPY;  Surgeon: Gatha Mayer, MD;  Location: [facility];  Service: Endoscopy;  Laterality: N/A;  . COLONOSCOPY W/ BIOPSIES AND POLYPECTOMY  07/22/2007, 04/28/2007   2009: diverticulosis, 2008: diverticulosis, adenomatous polyps  . COLONOSCOPY WITH PROPOFOL N/A 10/01/2012   Procedure: COLONOSCOPY WITH PROPOFOL;  Surgeon: Gatha Mayer, MD;   Location: [facility];  Service: Endoscopy;  Laterality: N/A;  . COLONOSCOPY WITH PROPOFOL N/A 10/10/2014   Procedure: COLONOSCOPY WITH PROPOFOL;  Surgeon: Gatha Mayer, MD;  Location: [facility];  Service: Endoscopy;  Laterality: N/A;  . ESOPHAGOGASTRODUODENOSCOPY (EGD) WITH PROPOFOL N/A 10/01/2012   Procedure: ESOPHAGOGASTRODUODENOSCOPY (EGD) WITH PROPOFOL;  Surgeon: Gatha Mayer, MD;  Location: [facility];  Service: Endoscopy;  Laterality: N/A;  . HOT HEMOSTASIS N/A 12/21/2012   Procedure: HOT HEMOSTASIS (ARGON PLASMA COAGULATION/BICAP);  Surgeon: Gatha Mayer, MD;  Location: [facility];  Service: Endoscopy;  Laterality: N/A;  . HOT HEMOSTASIS N/A 10/10/2014   Procedure: HOT HEMOSTASIS (ARGON PLASMA COAGULATION/BICAP);  Surgeon: Gatha Mayer, MD;  Location: [facility];  Service: Endoscopy;  Laterality: N/A;  . LEFT AND RIGHT HEART CATHETERIZATION WITH CORONARY ANGIOGRAM  12/19/2011   Procedure: LEFT AND RIGHT HEART CATHETERIZATION WITH CORONARY ANGIOGRAM;  Surgeon: Leonie Man, MD;  Location: [facility] CATH LAB;  Service: Cardiovascular;;  . RIGHT AND LEFT CARDIAC CATHETERIZATION  July 2013   RAP: 22 mmHg, and RVP: 46/16 mmHg, RV EDP 18; L. BP 110/11 mmHg, LVEDP 20 mmHg;  PAP 45/29 mmHg (mean 37 mmHg); PCWP 29/22 mmHg - 26 mmHg; no significant coronary disease  . TEE WITHOUT CARDIOVERSION  03/24/2012   Procedure: TRANSESOPHAGEAL ECHOCARDIOGRAM (TEE);  Surgeon: Pixie Casino, MD;  Location[facility];  Service: Cardiovascular;  Laterality: N/A;  TEE with [place]  . TOTAL KNEE ARTHROPLASTY  1997   right  . UPPER GASTROINTESTINAL ENDOSCOPY  04/09/2006   w/Dilation, gastritis  . VIDEO BRONCHOSCOPY Bilateral 07/22/2016   Procedure: VIDEO BRONCHOSCOPY WITH FLUORO;  Surgeon: Juanito Doom, MD;  Location: [facility];  Service: Cardiopulmonary;  Laterality: Bilateral;    Social History Social History   Social History  . Marital status: Married    Spouse name: N/A  . Number of  children: 2  . Years of education: HS   Occupational History  . Disability    Social History Main Topics  . Smoking status: Former Smoker    Packs/day: 2.00    Years: 30.00    Types: Cigarettes    Quit date: 06/02/1994  . Smokeless tobacco: Never Used  . Alcohol use No  . Drug use: No  . Sexual activity: No   Other Topics Concern  . Not on file   Social History Narrative   She is a married mother of 2, grandmother of 63, great-grandmother of 2. She is with her husband here today. She does not really exercise as she is on home oxygen and has baseline dyspnea.    Does not smoke. Does not drink. She quit smoking somewhere between 10-15 years ago. She is now trying to work on picking up her activity level. She is trying to I think more than likely get back into pulmonary rehabilitation potentially.  Daily caffeine.      Allergies  Allergen Reactions  . Aspirin Shortness Of Breath  . Nsaids Shortness Of Breath    Family History  Problem Relation Age of Onset  . Heart disease Mother   . Stroke Mother   . Heart disease Father   . Heart disease Brother   . Stroke Brother   . Skin cancer Brother   . Colon polyps Sister   . Diabetes Sister   . Diabetes Brother   . Irritable bowel syndrome Daughter   . Colon cancer Neg Hx       Prior to Admission medications   Medication Sig Start Date End Date Taking? Authorizing Provider  acetaminophen (TYLENOL) 500 MG tablet Take 1 tablet (500 mg total) by mouth every 6 (six) hours as needed for mild pain or headache. 02/01/17   Arrien, Jimmy Picket, MD  albuterol (PROVENTIL) (2.5 MG/3ML) 0.083% nebulizer solution Take 3 mLs (2.5 mg total) by nebulization every 6 (six) hours as needed for wheezing or shortness of breath. 06/09/16   Magdalen Spatz, NP  ALPRAZolam Duanne Moron) 0.25 MG tablet Take 0.25 mg by mouth at bedtime as needed for anxiety.     [provider]  aspirin EC 81 MG EC tablet Take 1 tablet (81 mg total) by mouth  daily. 02/01/17 03/03/17  Arrien, Jimmy Picket, MD  atorvastatin (LIPITOR) 40 MG tablet Take 40 mg by mouth at bedtime.  06/06/13   [provider]  azithromycin (ZITHROMAX) 250 MG tablet 1 tab daily.  Start after finishing Levaquin. 02/23/17   Juanito Doom, MD  benzonatate (TESSALON) 200 MG capsule Take 200 mg by mouth at bedtime.     [provider]  buPROPion (WELLBUTRIN XL) 300 MG 24 hr tablet Take 300 mg by mouth daily. 03/03/16   [provider]  DULoxetine (CYMBALTA) 60 MG capsule Take 60 mg by mouth daily.     [provider]  furosemide (LASIX) 40 MG tablet Take 2 tablets (80 mg total) by mouth 2 (two) times daily. 01/16/17   Larey Dresser, MD  gabapentin (NEURONTIN) 600 MG tablet Take 600 mg by mouth 3 (three) times daily.     [provider]  Guaifenesin (MUCINEX MAXIMUM STRENGTH) 1200 MG TB12 Take 1,200 mg by mouth 2 (two) times daily as needed (cough / congestion).     [provider]  HYDROcodone-acetaminophen (NORCO/VICODIN) 5-325 MG per tablet Take 1 tablet by mouth every 4 (four) hours as needed for moderate pain. Reported on 12/12/2015    [provider]  insulin glargine (LANTUS) 100 UNIT/ML injection Inject 0.66 mLs (66 Units total) into the skin daily before breakfast. Patient taking differently: Inject 64 Units into the skin daily before breakfast.  01/24/16   Janece Canterbury, MD  insulin lispro (HUMALOG) 100 UNIT/ML injection Inject 6 Units into the skin See admin instructions. Inject 6 units subcutaneously twice daily (with breakfast and supper) if CBG >150    [provider]  irbesartan (AVAPRO) 150 MG tablet Take 1 tablet (150 mg total) by mouth daily. 12/31/16   Larey Dresser, MD  iron polysaccharides (NIFEREX) 150 MG capsule Take 1 capsule (150 mg total) by mouth 2 (two) times daily. 01/24/16   Janece Canterbury, MD  levalbuterol [facility] HFA) 45 MCG/ACT inhaler Inhale 1-2 puffs into the lungs every 4  (four) hours as needed for wheezing. Patient taking differently: Inhale 2 puffs into the lungs every 4 (four) hours as needed for wheezing or  shortness of breath.  06/01/12   Marton Redwood, MD  levofloxacin (LEVAQUIN) 750 MG tablet Take 1 tablet (750 mg total) by mouth daily. 02/23/17   Juanito Doom, MD  metolazone (ZAROXOLYN) 5 MG tablet Take 5 mg by mouth 2 (two) times a week.    [provider]  Multiple Vitamin (MULTIVITAMIN WITH MINERALS) TABS tablet Take 1 tablet by mouth daily.    [provider]  omeprazole (PRILOSEC) 20 MG capsule Take 20 mg by mouth daily after supper.     [provider]  OXYGEN Inhale 5-8 L into the lungs See admin instructions. Use 5L when sitting and 8 L when up and moving    [provider]  polyethylene glycol (MIRALAX / GLYCOLAX) packet Take 17 g by mouth daily. Mix in 8 oz liquid and drink    [provider]  polyvinyl alcohol (ARTIFICIAL TEARS) 1.4 % ophthalmic solution Place 1 drop into both eyes 2 (two) times daily.     [provider]  potassium chloride (K-DUR,KLOR-CON) 10 MEQ tablet Take 3 tablets (30 mEq total) by mouth 2 (two) times daily. 04/18/16   Larey Dresser, MD  roflumilast (DALIRESP) 500 MCG TABS tablet Take 500 mcg by mouth daily.    [provider]  sodium chloride HYPERTONIC 3 % nebulizer solution Take by nebulization 2 (two) times daily. Patient taking differently: Take 4 mLs by nebulization 2 (two) times daily.  11/25/16   Juanito Doom, MD  SPIRIVA HANDIHALER 18 MCG inhalation capsule INHALE THE CONTENTS OF 1 CAPSULE VIA HANDIHALER BY MOUTH EVERY MORNING 11/03/16   Juanito Doom, MD  SYMBICORT 160-4.5 MCG/ACT inhaler INHALE 2 PUFFS BY MOUTH TWICE DAILY 01/27/17   Juanito Doom, MD  verapamil (VERELAN PM) 360 MG 24 hr capsule TAKE ONE CAPSULE BY MOUTH DAILY Patient taking differently: TAKE 360mg  BY MOUTH once DAILY 12/08/16   Leonie Man, MD    Physical  Exam:  Vitals:   03/02/17 1230 03/02/17 1300 03/02/17 1400 03/02/17 1430  BP: (!) 84/56 (!) 117/42 (!) 115/43 (!) 114/44  Pulse: (!) 107 (!) 106 (!) 106 (!) 107  Resp: (!) 21 (!) 23 19 20   Temp:      TempSrc:      SpO2: 100% 100% 100% 100%   Constitutional: NAD, chronicaly ill appearing  Eyes: PERRL, lids and conjunctivae normal ENMT: Mucous membranes are moist, without exudate or lesions. Some dry blood noted in oral mucosa, no frank bleed.Currently she is not on nasal tampons. No bleeding at this time. Neck: normal, supple, no masses, no thyromegaly Respiratory: Chronic atelectatic sounds, no wheezing or Rales or rhonchi at this time. Normal respiratory effort  Cardiovascular: Irregularly irregular rate and rhythm,  1/6 murmur, rubs or gallops. No extremity edema. 2+ pedal pulses. No carotid bruits.  Abdomen: Soft, moderately obese non tender, No hepatosplenomegaly. Bowel sounds positive.  Musculoskeletal: no clubbing / cyanosis. Moves all extremities Skin: no jaundice, some areas of chronic ecchymosis, chronic, on her upper and lower extremities. No other areas of active bleeding.  Neurologic: Sensation intact  Strength equal in all extremities   Labs on Admission: I have personally reviewed following labs and imaging studies  CBC:  Recent Labs Lab 03/02/17 1200  WBC 14.5*  NEUTROABS 12.1*  HGB 8.1*  HCT 28.2*  MCV 75.8*  PLT 992    Basic Metabolic Panel:  Recent Labs Lab 03/02/17 1200  NA 134*  K 3.8  CL 95*  CO2 29  GLUCOSE 139*  BUN 27*  CREATININE 1.32*  CALCIUM 8.8*    GFR: Estimated Creatinine Clearance: 44.3 mL/min (A) (by C-G formula based on SCr of 1.32 mg/dL (H)).  Liver Function Tests:  Recent Labs Lab 03/02/17 1200  AST 22  ALT 13*  ALKPHOS 57  BILITOT 0.6  PROT 6.1*  ALBUMIN 3.4*   No results for input(s): LIPASE, AMYLASE in the last 168 hours. No results for input(s): AMMONIA in the last 168 hours.  Coagulation Profile:  Recent  Labs Lab 03/02/17 1200  INR 1.35    Cardiac Enzymes: No results for input(s): CKTOTAL, CKMB, CKMBINDEX, TROPONINI in the last 168 hours.  BNP (last 3 results)  Recent Labs  05/19/16 1059 11/25/16 1518  PROBNP 47 42    HbA1C: No results for input(s): HGBA1C in the last 72 hours.  CBG: No results for input(s): GLUCAP in the last 168 hours.  Lipid Profile: No results for input(s): CHOL, HDL, LDLCALC, TRIG, CHOLHDL, LDLDIRECT in the last 72 hours.  Thyroid Function Tests: No results for input(s): TSH, T4TOTAL, FREET4, T3FREE, THYROIDAB in the last 72 hours.  Anemia Panel: No results for input(s): VITAMINB12, FOLATE, FERRITIN, TIBC, IRON, RETICCTPCT in the last 72 hours.  Urine analysis:    Component Value Date/Time   COLORURINE YELLOW 06/26/2016 1706   APPEARANCEUR CLEAR 06/26/2016 1706   LABSPEC 1.015 06/26/2016 1706   PHURINE 6.0 06/26/2016 1706   GLUCOSEU NEGATIVE 06/26/2016 1706   HGBUR NEGATIVE 06/26/2016 1706   BILIRUBINUR NEGATIVE 06/26/2016 1706   KETONESUR NEGATIVE 06/26/2016 1706   PROTEINUR NEGATIVE 05/25/2016 0205   UROBILINOGEN 0.2 06/26/2016 1706   NITRITE NEGATIVE 06/26/2016 1706   LEUKOCYTESUR NEGATIVE 06/26/2016 1706    Sepsis Labs: @LABRCNTIP (procalcitonin:4,lacticidven:4) )No results found for this or any previous visit (from the past 240 hour(s)).   Radiological Exams on Admission: Dg Chest Portable 1 View  Result Date: 03/02/2017 CLINICAL DATA:  Shortness of breath. EXAM: PORTABLE CHEST 1 VIEW COMPARISON:  Chest x-ray dated January 30, 2017. FINDINGS: The cardiomediastinal silhouette is stable. Normal pulmonary vascularity. Atherosclerotic calcification of the aortic arch. Bibasilar atelectasis/ scarring. No focal consolidation, pleural effusion, or pneumothorax. No acute osseous abnormality. IMPRESSION: Stable bibasilar atelectasis/scarring. Electronically Signed   By: Titus Dubin M.D.   On: 03/02/2017 13:01    EKG: Independently  reviewed.  Assessment/Plan Active Problems:   Bleeding from the nose   Personal history of colonic polyps   Diabetes mellitus, type II, insulin dependent (HCC)   Anxiety state   Depression   Essential hypertension   Osteoarthritis   Osteoporosis   CHRONIC DYSPNEA   Pulmonary nodules   Pulmonary hypertension (HCC)   Chronic diastolic heart failure of unknown etiology ([place])   Morbid obesity (HCC)   Anemia    Recurrent epistaxis with acute blood loss anemia, likely related to high dose O2 [place] and patient being on Xarelto, and possibly further nasal vessels that may need cauterization . Similar admission 1 month ago. Last dose of Xarelto  yesterday. She had a nasal cauterization by ENT about 3 weeks prior to this admission. Since presentation, her bleeding has almost subsided. No other areas of bleeding. Vitals signs are stable, afebrile. Hemoglobin 8.1. No transfusion was received at this time. Baseline hemoglobin between 9 and 11. EKG unchanged. CXR NAD PT 15, INR 1.6  Hold Xarelto and ASA for now.  Nasal tampon/cotton ball prn  Home humidifier  Follow with ENT this week for possible cauterization of offending vessels   Atrial  Fibrillation   on anticoagulation with Xarelto. THis was supposed to be held due to bleeding issues, but patient resumed it after d/c, along with ASA  Hold Xarelto, will need follow up with Dr. Sallyanne Kuster this week for further  Instructions Resume ASA 81 mg  Continue other meds  Chronic diastolic heart failure, last echocardiogram with normal LVF, EF 55-60 Gr 1  DD.current weight 213 lbs . Appears compensated. CXR NAD  Careful use of IVF  Daily weights, strict I/O Continue Lasix, metolazone in am   COPD without exacerbation. Patient on 5-8 L at home [place] CXR NAD  WBC chronically elevated, at 14   Continue nebs, guaifenesin, tessalon and O2 at same level to maintain O2  Level greater than 88    Chronic kidney disease stage  3   baseline creatinine  1  Current Cr   1.32 Lab Results  Component Value Date   CREATININE 1.32 (H) 03/02/2017   CREATININE 0.82 02/01/2017   CREATININE 0.89 01/31/2017   No further recommendations   Type II Diabetes with Neuropathy Current blood sugar level is 139  Lab Results  Component Value Date   HGBA1C 5.9 (H) 05/26/2016  Hgb A1C Lantus , SSI Continue Neurontin   Hypertension BP 104/49   Pulse  103  . Had her meds in am  Continue home anti-hypertensive medications in am   Hyperlipidemia Continue home statins  Anxiety and Depression Continue Xanax for anxiety and Wellbutrin , Cymnbalta   for depression   GERD, no acute symptoms Continue PPI   Conn Trombetta E, PA-C [facility]   03/02/2017, 3:01 PM

## 2017-03-02 NOTE — Telephone Encounter (Signed)
Called and lmomtcb x 1 for [place] with [facility]

## 2017-03-02 NOTE — ED Notes (Signed)
Pt placed on 5 lpm [place] per EDP.

## 2017-03-05 ENCOUNTER — Encounter: Payer: Self-pay | Admitting: Acute Care

## 2017-03-05 ENCOUNTER — Other Ambulatory Visit: Payer: Self-pay | Admitting: Cardiology

## 2017-03-05 ENCOUNTER — Ambulatory Visit (INDEPENDENT_AMBULATORY_CARE_PROVIDER_SITE_OTHER): Payer: Medicare Other | Admitting: Acute Care

## 2017-03-05 DIAGNOSIS — I5032 Chronic diastolic (congestive) heart failure: Secondary | ICD-10-CM

## 2017-03-05 DIAGNOSIS — J9611 Chronic respiratory failure with hypoxia: Secondary | ICD-10-CM | POA: Diagnosis not present

## 2017-03-05 DIAGNOSIS — J441 Chronic obstructive pulmonary disease with (acute) exacerbation: Secondary | ICD-10-CM | POA: Diagnosis not present

## 2017-03-05 MED ORDER — PREDNISONE 10 MG PO TABS: ORAL_TABLET | ORAL | 0 refills | Status: DC

## 2017-03-05 MED ORDER — BUDESONIDE-FORMOTEROL FUMARATE 160-4.5 MCG/ACT IN AERO: 2.0000 | INHALATION_SPRAY | Freq: Two times a day (BID) | RESPIRATORY_TRACT | 0 refills | Status: DC

## 2017-03-05 NOTE — Assessment & Plan Note (Signed)
Improved after treatment with Levaquin Secretions are no longer green Continued wheezing Plan: Prednisone taper; 10 mg tablets: 4 tabs x 2 days, 3 tabs x 2 days, 2 tabs x 2 days 1 tab x 2 days then stop. Continue  Symbicort and Spiriva Continue your Albuterol neb treatments as you have been doing. We will place an order for xopenex nebs . If approved use up to 4 times a day as needed for worsening shortness of breath or wheezing. Continue taking guaifenesin over-the-counter with a full glass of water. Flutter valve 10 puffs 4 timed daily. Continue using oxygen at 5 L at rest and 8 L with exertion. Saturation goals are 88-92% Continue using lasix as you have been doing. Follow up with Judson Roch NP or Dr. Lake Bells  in 3 weeks Please contact office for sooner follow up if symptoms do not improve or worsen or seek emergency care  We will consider flu shot at next visit

## 2017-03-05 NOTE — Assessment & Plan Note (Signed)
Continue oxygen at 5 L with rest, and 8 L with exertion. Saturation goals are 88-92%

## 2017-03-05 NOTE — Patient Instructions (Addendum)
It is good to see you today. I am glad you are feeling better. Prednisone taper; 10 mg tablets: 4 tabs x 2 days, 3 tabs x 2 days, 2 tabs x 2 days 1 tab x 2 days then stop. Continue  Symbicort and Spiriva Continue your Albuterol neb treatments as you have been doing. We will place an order for xopenex nebs . If approved use up to 4 times a day as needed for worsening shortness of breath or wheezing. Continue taking guaifenesin over-the-counter with a full glass of water. Flutter valve 10 puffs 4 timed daily. Continue using oxygen at 5 L at rest and 8 L with exertion. Saturation goals are 88-92% Continue using lasix as you have been doing. Follow up with Judson Roch NP or Dr. Lake Bells  in 3 weeks Please contact office for sooner follow up if symptoms do not improve or worsen or seek emergency care  We will consider flu shot at next visit.

## 2017-03-05 NOTE — Progress Notes (Signed)
History of Present Illness Destiny Shelton is a 71 y.o. female with COPD  Synopsys: Former patient of Dr. Gwenette Greet with pulmonary hypertension, Afib, and COPD.  She smoked 2 packs per day for 25 years and quit in the 1990's.  She has been on oxygen since 2012.  As of 2017 She has been using 5L O2 at rest and 6 L with exertion. She does not have a history of PE that she is aware of.She is now followed by Dr. Lake Bells.  03/05/2017 1 week Follow up: Pt. Was seen by Dr. 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 She states she continues to  cough up secretions that have turned from green last week to brownish color. She states this is closer to her baseline. She has completed her Levaquin and she is back on her daily azithromycin.She is compliant with her Symbicort and Spiriva daily.She is compliant with her lasix daily. She used the hypertonic saline nebs x 3-5 days, and has now resumed the albuterol nebs. She uses them about twice daily. She confirms that her larger oxygen tanks were delivered , and that she is using them.She states she feels she is better than she was last week. She had an ED visit 10/1 for nose bleed. She has a referral to ENT. Her home oxygen does have humidification.  She is currently not on blood thinner for her  atrial fibrillation. She is going to follow up with Dr. Brigitte Pulse about other options for treatment. She is currently taking a baby aspirin daily.She states she tried pulmonary rehab, but it just made her worse. She denies chest pain, fever, orthopnea or hemoptysis.   Test Results: 03/05/2017>>CAT Score 32  CXR 03/02/2017>>IMPRESSION: Stable bibasilar atelectasis/scarring.   02/23/2017>> Ambulatory Saturation  5L > 85% 8L O2 > 93% Walking on 8L [place] > O2 saturation dropped to 77%  Microbiology  BAL February 2018 Pseudomonas  PFT: PFT 2011:  FEV1 1.34 (55%), ratio 68, ++airtrapping on lung volumes, nl TLC, DLCO 40% pred. PFT's  03/23/2015  FEV1 1.61 (68 % ) ratio 69  p no % improvement from saba on   Symb/spiriva January 2018 pulmonary function testing ratio 70%, FEV1 1.63 L 70% predicted, FVC 2.33 L 76% predicted, total lung capacity 4.48 L 86% predicted, DLCO 5. 09/06/2019 percent predicted  Heart Cath: LHC 12/2011: non-obstructive cad RHC 12/2011:  PA 45/29 with mean 31m, PCWP 22, CO Fick 10.25, PVR 1.17 wood units, slight step up in saturation from                         RV to PA 07/04/2015 RHC: RA mean 9 RV 45/10 PA 41/20, mean 30 PCWP mean 13  Oxygen saturations: SVC 68% RA 65% RV 61% PA 69% LA 98% Peripheral sat 93%  Cardiac  Output (Fick) 6.7  Cardiac Index (Fick) 3.3 PVR 2.5 WU Qp/Qs = 0.97 .  Imaging: CT chest 12/2011:  No PE, very mild mosaic perfusion abnl (prob secondary to her known airflow obstruction).  January 2018 high-resolution CT scan of the chest shows air trapping, irregular distribution upper lobe fibrotic changes (interlobular septal thickening, some patchy ground glass) in a patchy distribution not consistent with usual interstitial pneumonitis  Cardiac imaging: Myoview 2012: no ischemia Echo 2012: nl LV and EF, impaired relaxation, nothing to suggest pulm htn. Echo 1/88/4166:  Nl LV, diastolic dysfxn, normal RV, estimated RVSP 46 TEE 03/2012:  A few late bubbles  seen felt c/w small IP shunting.  Cardiac MRI 2013:  No infiltrative    CBC Latest Ref Rng & Units 03/02/2017 02/01/2017 01/31/2017  WBC 4.0 - 10.5 K/uL 14.5(H) 10.8(H) 11.2(H)  Hemoglobin 12.0 - 15.0 g/dL 8.1(L) 8.9(L) 9.1(L)  Hematocrit 36.0 - 46.0 % 28.2(L) 30.8(L) 30.8(L)  Platelets 150 - 400 K/uL 318 280 290    BMP Latest Ref Rng & Units 03/02/2017 02/01/2017 01/31/2017  Glucose 65 - 99 mg/dL 139(H) 159(H) 151(H)  BUN 6 - 20 mg/dL 27(H) 12 10  Creatinine 0.44 - 1.00 mg/dL 1.32(H) 0.82 0.89  Sodium 135 - 145 mmol/L 134(L) 138 137  Potassium 3.5 - 5.1 mmol/L 3.8 3.7 3.0(L)  Chloride 101 - 111 mmol/L 95(L) 94(L) 91(L)  CO2 22 - 32 mmol/L 29 34(H) 35(H)  Calcium 8.9 - 10.3 mg/dL 8.8(L) 9.2 8.8(L)    BNP    Component Value Date/Time   BNP 21.2 01/29/2017 0544    ProBNP    Component Value Date/Time   PROBNP 42 11/25/2016 1518   PROBNP 316.0 (H) 07/08/2013 1425    PFT    Component Value Date/Time   FEV1PRE 1.50 06/26/2016 1441   FEV1POST 1.63 06/26/2016 1441   FVCPRE 2.30 06/26/2016 1441   FVCPOST 2.33 06/26/2016 1441   TLC 4.48 06/26/2016 1441   DLCOUNC 5.46 06/26/2016 1441   PREFEV1FVCRT 65 06/26/2016 1441   PSTFEV1FVCRT 70 06/26/2016 1441    Dg Chest Portable 1 View  Result Date: 03/02/2017 CLINICAL DATA:  Shortness of breath. EXAM: PORTABLE CHEST 1 VIEW COMPARISON:  Chest x-ray dated January 30, 2017. FINDINGS: The cardiomediastinal silhouette is stable. Normal pulmonary vascularity. Atherosclerotic calcification of the aortic arch. Bibasilar atelectasis/ scarring. No focal consolidation, pleural effusion, or pneumothorax. No acute osseous abnormality. IMPRESSION: Stable bibasilar atelectasis/scarring. Electronically Signed   By: Titus Dubin M.D.   On: 03/02/2017 13:01     Past medical hx Past Medical History:  Diagnosis Date  . Adenomatous colon polyp   . ALLERGIC RHINITIS   . Anemia    iron deficient  . Anxiety   . Atrial tachycardia (HCC)    Mostly Sinus  Tachycardia  . Back pain, chronic   . Carotid stenosis   . CHF (congestive heart failure) ([place])   . Community acquired pneumonia - Recent Admission (07/2013) 07/08/2013  . COPD (chronic obstructive pulmonary disease) ([place])    Requiring Home O2 at 4L  . Depression   . Diabetes mellitus type 2 with neurological manifestations ([place])    On Insulin  . Diverticulosis of colon   . External hemorrhoids   . Fibromyalgia    "pain in arms and shoulders."  . Gastritis   . GERD (gastroesophageal reflux disease)   . Headache(784.0)    tension  . Hyperlipemia   . Hypertension   . Inappropriate sinus tachycardia   . Morbidly obese (  [place])   . Nephrolithiasis   . Neuromuscular disorder ([place])    diabetic neuropathy  . Osteoarthritis   . Osteoporosis   . PAF (paroxysmal atrial fibrillation) ([place])   . 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  . Sleep apnea    no cpap machine  02 at 4l/min all the time  . Urosepsis 05/26/2012     Social History  Substance Use Topics  . Smoking status: Former Smoker    Packs/day: 2.00    Years: 30.00    Types: Cigarettes    Quit date: 06/02/1994  . Smokeless tobacco: Never Used  . Alcohol use No    Ms.Brandl reports that she quit smoking about 22 years ago. Her smoking use included Cigarettes. She has a 60.00 pack-year smoking history. She has never used smokeless tobacco. She reports that she does not drink alcohol or use drugs.  Tobacco Cessation: Former Smoker 60 pack years quit 1996  Past surgical hx, Family hx, Social hx all reviewed.  Current Outpatient Prescriptions on File Prior to Visit  Medication Sig  . acetaminophen (TYLENOL) 500 MG tablet Take 1 tablet (500 mg total) by mouth every 6 (six) hours as needed for mild pain or headache.  . albuterol (PROVENTIL) (2.5 MG/3ML) 0.083% nebulizer solution Take 3 mLs (2.5 mg total) by nebulization every 6 (six) hours as needed for wheezing or shortness of breath.  . ALPRAZolam (XANAX) 0.5 MG tablet Take 0.25-0.5 mg by  mouth daily as needed for anxiety.   Marland Kitchen atorvastatin (LIPITOR) 40 MG tablet Take 40 mg by mouth at bedtime.   Marland Kitchen azithromycin (ZITHROMAX) 250 MG tablet 1 tab daily.  Start after finishing Levaquin. (Patient taking differently: Take 250 mg by mouth daily. )  . benzonatate (TESSALON) 200 MG capsule Take 400 mg by mouth at bedtime.   Marland Kitchen buPROPion (WELLBUTRIN XL) 300 MG 24 hr tablet Take 300 mg by mouth daily.  . DULoxetine (CYMBALTA) 60 MG capsule Take 60 mg by mouth daily.   . furosemide (LASIX) 40 MG tablet Take 2 tablets (80 mg total) by mouth 2 (two) times daily.  Marland Kitchen gabapentin (NEURONTIN) 600 MG tablet Take 600 mg by mouth 3 (three) times daily.   . Guaifenesin (MUCINEX MAXIMUM STRENGTH) 1200 MG TB12 Take 1,200 mg by mouth 2 (two) times daily as needed (for cough or congestion).   Marland Kitchen HYDROcodone-acetaminophen (NORCO/VICODIN) 5-325 MG per tablet Take 1 tablet by mouth See admin instructions. EVERY 4-6 HOURS AS NEEDED FOR PAIN OR SHORTNESS OF BREATH  . insulin aspart (NOVOLOG FLEXPEN) 100 UNIT/ML FlexPen Inject 5 Units into the skin 3 (three) times daily as needed for high blood sugar.  . insulin glargine (LANTUS) 100 UNIT/ML injection Inject 0.66 mLs (66 Units total) into the skin daily before breakfast. (Patient taking differently: Inject 64 Units into the skin daily before breakfast. )  . irbesartan (AVAPRO) 150 MG tablet Take 1 tablet (150 mg total) by mouth daily.  . iron polysaccharides (NIFEREX) 150 MG capsule Take 1 capsule (150 mg total) by mouth 2 (two) times daily. (Patient taking differently: Take 150 mg by mouth daily. )  . levalbuterol (XOPENEX HFA) 45 MCG/ACT inhaler Inhale 1-2 puffs into the lungs every 4 (four) hours as needed for wheezing. (Patient taking differently: Inhale 2 puffs into the lungs every 4 (four) hours as needed for wheezing or shortness of breath. )  . levofloxacin (LEVAQUIN) 750 MG tablet Take 1 tablet (750 mg total) by mouth daily.  . metolazone (ZAROXOLYN) 5 MG tablet  Take  5 mg by mouth 2 (two) times a week. Wednesday and Friday  . Multiple Vitamin (MULTIVITAMIN WITH MINERALS) TABS tablet Take 1 tablet by mouth daily.  Marland Kitchen omeprazole (PRILOSEC OTC) 20 MG tablet Take 20 mg by mouth at bedtime.  . OXYGEN Inhale 5-8 L into the lungs See admin instructions. 5 liters when at rest and 8 liters when ambulatory  . polyethylene glycol (MIRALAX / GLYCOLAX) packet Take 17 g by mouth daily. Mix in 8 oz liquid and drink  . polyvinyl alcohol (ARTIFICIAL TEARS) 1.4 % ophthalmic solution Place 1 drop into both eyes 2 (two) times daily.   . potassium chloride (K-DUR,KLOR-CON) 10 MEQ tablet Take 3 tablets (30 mEq total) by mouth 2 (two) times daily.  . roflumilast (DALIRESP) 500 MCG TABS tablet Take 500 mcg by mouth daily.  . sodium chloride HYPERTONIC 3 % nebulizer solution Take by nebulization 2 (two) times daily.  Marland Kitchen SPIRIVA HANDIHALER 18 MCG inhalation capsule INHALE THE CONTENTS OF 1 CAPSULE VIA HANDIHALER BY MOUTH EVERY MORNING  . SYMBICORT 160-4.5 MCG/ACT inhaler INHALE 2 PUFFS BY MOUTH TWICE DAILY (Patient taking differently: Inhale 2 puffs into the lungs two times a day)  . verapamil (VERELAN PM) 360 MG 24 hr capsule TAKE ONE CAPSULE BY MOUTH DAILY (Patient taking differently: Take 360 mg by mouth once a day)  . XARELTO 20 MG TABS tablet Take 20 mg by mouth daily with supper.   No current facility-administered medications on file prior to visit.      Allergies  Allergen Reactions  . Aspirin Shortness Of Breath  . Nsaids Shortness Of Breath    Review Of Systems:  Constitutional:   No  weight loss, night sweats,  Fevers, chills, fatigue, or  lassitude.  HEENT:   No headaches,  Difficulty swallowing,  Tooth/dental problems, or  Sore throat,                No sneezing, itching, ear ache, nasal congestion, post nasal drip,   CV:  No chest pain,  Orthopnea, PND, swelling in lower extremities, anasarca, dizziness, palpitations, syncope.   GI  No heartburn,  indigestion, abdominal pain, nausea, vomiting, diarrhea, change in bowel habits, loss of appetite, bloody stools.   Resp: + shortness of breath with exertion or at rest.  + excess mucus, + productive cough,  No non-productive cough,  No coughing up of blood.  No change in color of mucus.  + wheezing.  No chest wall deformity  Skin: no rash or lesions.  GU: no dysuria, change in color of urine, no urgency or frequency.  No flank pain, no hematuria   MS:  No joint pain or swelling.  No decreased range of motion.  No back pain.  Psych:  No change in mood or affect. No depression or anxiety.  No memory loss.   Vital Signs BP 120/60 (BP Location: Left Arm, Cuff Size: Normal)   Pulse (!) 117   Ht _0  (1.651 m)   Wt 213 lb 12.8 oz (97 kg)   SpO2 92%   BMI 35.58 kg/m    Physical Exam:  General- No distress,  A&Ox3, pleasant ENT: No sinus tenderness, TM clear, pale nasal mucosa, no oral exudate,no post nasal drip, no LAN Cardiac: S1, S2, regular rate and rhythm, no murmur Chest: + wheeze/ rales/ coarse throughout with few rhonchi which clear with cough ; no accessory muscle use, no nasal flaring, no sternal retractions Abd.: Soft Non-tender, nondistended, obese Ext: No clubbing cyanosis, edema  Neuro:  Deconditioned at baseline, cranial nerves intact, appropriate Skin: No rashes, warm and dry, frail thin skin Psych: normal mood and behavior, appropriate  I spent 5 minutes initiating the Advanced Directive Discussion  With Dwanda and her husband. They have taken the paperwork, and will discuss goals of care in the event Ellene  has a serious  hospitalization.  Assessment/Plan  COPD exacerbation (HCC) Improved after treatment with Levaquin Secretions are no longer green Continued wheezing Plan: Prednisone taper; 10 mg tablets: 4 tabs x 2 days, 3 tabs x 2 days, 2 tabs x 2 days 1 tab x 2 days then stop. Continue  Symbicort and Spiriva Continue your Albuterol neb treatments as you have  been doing. We will place an order for xopenex nebs . If approved use up to 4 times a day as needed for worsening shortness of breath or wheezing. Continue taking guaifenesin over-the-counter with a full glass of water. Flutter valve 10 puffs 4 timed daily. Continue using oxygen at 5 L at rest and 8 L with exertion. Saturation goals are 88-92% Continue using lasix as you have been doing. Follow up with Judson Roch NP or Dr. Lake Bells  in 3 weeks Please contact office for sooner follow up if symptoms do not improve or worsen or seek emergency care  We will consider flu shot at next visit  Chronic respiratory failure ([place]) Continue oxygen at 5 L with rest, and 8 L with exertion. Saturation goals are 77-93%  Chronic diastolic CHF (congestive heart failure) (HCC) Continue weights daily, Continue Lasix as prescribed per cardiology Monitor for fluid retention   Short term follow up to ensure Guyla is continuing to improve after prednisone taper Flu shot if better at follow up.  Magdalen Spatz, NP 03/05/2017  1:42 PM

## 2017-03-05 NOTE — Assessment & Plan Note (Signed)
Continue weights daily, Continue Lasix as prescribed per cardiology Monitor for fluid retention

## 2017-03-11 DIAGNOSIS — R040 Epistaxis: Secondary | ICD-10-CM | POA: Diagnosis not present

## 2017-03-23 ENCOUNTER — Other Ambulatory Visit (HOSPITAL_COMMUNITY): Payer: Self-pay | Admitting: *Deleted

## 2017-03-23 MED ORDER — POTASSIUM CHLORIDE CRYS ER 10 MEQ PO TBCR: 30.0000 meq | EXTENDED_RELEASE_TABLET | Freq: Two times a day (BID) | ORAL | 3 refills | Status: DC

## 2017-03-26 ENCOUNTER — Encounter: Payer: Self-pay | Admitting: Pulmonary Disease

## 2017-03-26 ENCOUNTER — Ambulatory Visit (INDEPENDENT_AMBULATORY_CARE_PROVIDER_SITE_OTHER): Payer: Medicare Other | Admitting: Pulmonary Disease

## 2017-03-26 VITALS — BP 138/66 | HR 117 | Ht 65.0 in | Wt 208.4 lb

## 2017-03-26 DIAGNOSIS — J432 Centrilobular emphysema: Secondary | ICD-10-CM | POA: Diagnosis not present

## 2017-03-26 DIAGNOSIS — J849 Interstitial pulmonary disease, unspecified: Secondary | ICD-10-CM

## 2017-03-26 DIAGNOSIS — J9611 Chronic respiratory failure with hypoxia: Secondary | ICD-10-CM | POA: Diagnosis not present

## 2017-03-26 DIAGNOSIS — Z23 Encounter for immunization: Secondary | ICD-10-CM

## 2017-03-26 DIAGNOSIS — I5032 Chronic diastolic (congestive) heart failure: Secondary | ICD-10-CM | POA: Diagnosis not present

## 2017-03-26 MED ORDER — PREDNISONE 10 MG PO TABS
ORAL_TABLET | ORAL | 0 refills | Status: DC
Start: 2017-03-26 — End: 2017-05-19

## 2017-03-26 NOTE — Patient Instructions (Addendum)
Chronic respiratory failure with hypoxemia: Keep using 5 L of oxygen at rest, 8 L with exertion  Severe COPD with Pseudomonas colonization: Continue daily azithromycin Continue Daliresp Continue Symbicort and Spiriva Flu shot today Repeat lung function test in July  Question of interstitial lung disease: I believe that you mostly had scarring from prior respiratory infections, but we will check a lung function test to make sure there is no evidence of worsening Start prednisone 20 mg daily for a month, then 10 mg daily for a month  We will see you back in 2 months or sooner if needed

## 2017-03-26 NOTE — Progress Notes (Signed)
Subjective:    Patient ID: Destiny Shelton, female    DOB: [birth year], 71 y.o.   MRN: [id number]  Synopsys: Former patient of Dr. Gwenette Greet with pulmonary hypertension, Afib, and COPD.  She smoked 2 packs per day for 25 years and quit in the 1990's.  She has been on oxygen since 2012.  As of 2017 She has been using 5L O2 at rest and 6 L with exertion.   She does not have a history of PE that she is aware of.    HPI Chief Complaint  Patient presents with  . Follow-up    pt c/o stable sob with any exertion, prod cough with cream colored mucus.     Destiny Shelton says she is better than last time.  The levaquin and prednisone really helped.  She is walking around but still struggling with dyspnea.  She is walking really slow.  She felt a lot better on prednisone, it made her breathe better.  She was able to walk more.  She is still producing green mucus, but not too much right now.  She is still using the salt water solution nebulized, it helps her clear out mucus.  She is back on daily azithromycin.  Past Medical History:  Diagnosis Date  . Adenomatous colon polyp   . ALLERGIC RHINITIS   . Anemia    iron deficient  . Anxiety   . Atrial tachycardia (HCC)    Mostly Sinus Tachycardia  . Back pain, chronic   . Carotid stenosis   . CHF (congestive heart failure) ([place])   . Community acquired pneumonia - Recent Admission (07/2013) 07/08/2013  . COPD (chronic obstructive pulmonary disease) ([place])    Requiring Home O2 at 4L  . Depression   . Diabetes mellitus type 2 with neurological manifestations ([place])    On Insulin  . Diverticulosis of colon   . External hemorrhoids   . Fibromyalgia    "pain in arms and shoulders."  . Gastritis   . GERD (gastroesophageal reflux disease)   . Headache(784.0)    tension  . Hyperlipemia   . Hypertension   . Inappropriate sinus tachycardia   . Morbidly obese ([place])   . Nephrolithiasis   . Neuromuscular disorder ([place])    diabetic neuropathy  . Osteoarthritis     . Osteoporosis   . PAF (paroxysmal atrial fibrillation) ([place])   . 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  . Sleep apnea    no cpap machine  02 at 4l/min all the time  . Urosepsis 05/26/2012      Review of Systems  Constitutional: Negative for chills, diaphoresis, fatigue and fever.  HENT: Negative for postnasal drip, rhinorrhea and sinus pressure.   Respiratory: Positive for cough, shortness of breath and wheezing.   Cardiovascular: Positive for leg swelling. Negative for chest pain and palpitations.       Objective:   Physical Exam Vitals:   03/26/17 0958  BP: 138/66  Pulse: (!) 117  SpO2: 90%  Weight: 208 lb 6.4 oz (94.5 kg)  Height: _0  (1.651 m)   5L > 85% 8L O2 > 93% Walking on 8L [place] > O2 saturation dropped to 77%   Gen: chronically ill appearing HENT: OP clear, TM's clear, neck supple PULM: few craclkes bases B, normal percussion CV: RRR, no mgr, trace edema GI: BS+, soft, nontender Derm: no cyanosis or rash Psyche: normal mood and affect     BMET    Component Value Date/Time   NA 134 (L) 03/02/2017  1200   K 3.8 03/02/2017 1200   CL 95 (L) 03/02/2017 1200   CO2 29 03/02/2017 1200   GLUCOSE 139 (H) 03/02/2017 1200   BUN 27 (H) 03/02/2017 1200   CREATININE 1.32 (H) 03/02/2017 1200   CREATININE 0.85 02/12/2015 0949   CALCIUM 8.8 (L) 03/02/2017 1200   GFRNONAA 39 (L) 03/02/2017 1200   GFRAA 45 (L) 03/02/2017 1200   Microbiology  BAL February 2018 Pseudomonas  PFT: PFT 2011:  FEV1 1.34 (55%), ratio 68, ++airtrapping on lung volumes, nl TLC, DLCO 40% pred. PFT's  03/23/2015  FEV1 1.61 (68 % ) ratio 69  p no % improvement from saba on   Symb/spiriva January 2018 pulmonary function testing ratio 70%, FEV1 1.63 L 70% predicted, FVC 2.33 L 76% predicted, total lung capacity 4.48 L 86% predicted, DLCO 5. 09/06/2019 percent predicted  Heart Cath: LHC 12/2011: non-obstructive cad RHC 12/2011:  PA 45/29 with mean 5m, PCWP 22, CO Fick 10.25, PVR 1.17 wood units,  slight step up in saturation from   RV to PA 07/04/2015 RHC: RA mean 9 RV 45/10 PA 41/20, mean 30 PCWP mean 13  Oxygen saturations: SVC 68% RA 65% RV 61% PA 69% LA 98% Peripheral sat 93%  Cardiac Output (Fick) 6.7  Cardiac Index (Fick) 3.3 PVR 2.5 WU Qp/Qs = 0.97 .  Imaging: CT chest 12/2011:  No PE, very mild mosaic perfusion abnl (prob secondary to her known airflow obstruction).  January 2018 high-resolution CT scan of the chest shows air trapping, irregular distribution upper lobe fibrotic changes (interlobular septal thickening, some patchy ground glass) in a patchy distribution not consistent with usual interstitial pneumonitis  Cardiac imaging: Myoview 2012: no ischemia Echo 2012: nl LV and EF, impaired relaxation, nothing to suggest pulm htn. Echo 75/32/9924  Nl LV, diastolic dysfxn, normal RV, estimated RVSP 46 TEE 03/2012:  A few late bubbles seen felt c/w small IP shunting.  Cardiac MRI 2013:  No infiltrative process.    Records from her visit here with her nurse practitioner earlier this month reviewed, she was tapered off of prednisone, was doing better after treated with Levaquin for a COPD exacerbation.     Assessment & Plan:    Centrilobular emphysema ([place] - Plan: Pulmonary function test  ILD (interstitial lung disease) ([facility]  Chronic respiratory failure with hypoxia (HCC)  Chronic diastolic CHF (congestive heart failure) ([place]    Discussion: [name] severe COPD, severe hypoxemia, and colonization with Pseudomonas.  We have questioned and interstitial lung disease like hypersensitivity pneumonitis but we have been unable to definitively confirm this.  She has had high oxygen needs for years, TEE in 2013 was consistent with a small amount of intrapulmonary shunting.  I question whether or not she may have a steroid responsive lung process, hypersensitivity pneumonitis?  Given the significant improvement in prednisone think it is worthwhile to treat her with daily  prednisone for a few months to see if this will keep her flareups of her lung disease.  I believe I am treating an inflammatory lung disease with this, though unfortunately I cannot say for certain if there is something other than just severe COPD and colonization with Pseudomonas.  She will continue maximum bronchodilator therapy as well as Reflumilast and daily azithromycin to prevent flareups.  We may want to consider inhaled tobramycin at some point.  Plan: Chronic respiratory failure with hypoxemia: Keep using 5 L of oxygen at rest, 8 L with exertion  Severe COPD with Pseudomonas colonization: Continue  daily azithromycin Continue Daliresp Continue Symbicort and Spiriva Flu shot today Repeat lung function test in July  Question of interstitial lung disease: I believe that you mostly had scarring from prior respiratory infections, but we will check a lung function test to make sure there is no evidence of worsening Start prednisone 20 mg daily for a month, then 10 mg daily for a month  We will see you back in 2 months or sooner if needed   Current Outpatient Prescriptions:  .  acetaminophen (TYLENOL) 500 MG tablet, Take 1 tablet (500 mg total) by mouth every 6 (six) hours as needed for mild pain or headache., Disp: 30 tablet, Rfl: 0 .  albuterol (PROVENTIL) (2.5 MG/3ML) 0.083% nebulizer solution, Take 3 mLs (2.5 mg total) by nebulization every 6 (six) hours as needed for wheezing or shortness of breath., Disp: 75 mL, Rfl: 3 .  ALPRAZolam (XANAX) 0.5 MG tablet, Take 0.25-0.5 mg by mouth daily as needed for anxiety. , Disp: , Rfl: 4 .  atorvastatin (LIPITOR) 40 MG tablet, Take 40 mg by mouth at bedtime. , Disp: , Rfl:  .  azithromycin (ZITHROMAX) 250 MG tablet, 1 tab daily.  Start after finishing Levaquin. (Patient taking differently: Take 250 mg by mouth daily. ), Disp: 30 tablet, Rfl: 2 .  benzonatate (TESSALON) 200 MG capsule, Take 400 mg by mouth at bedtime. , Disp: , Rfl:  .   budesonide-formoterol (SYMBICORT) 160-4.5 MCG/ACT inhaler, Inhale 2 puffs into the lungs 2 (two) times daily., Disp: 2 Inhaler, Rfl: 0 .  buPROPion (WELLBUTRIN XL) 300 MG 24 hr tablet, Take 300 mg by mouth daily., Disp: , Rfl:  .  DULoxetine (CYMBALTA) 60 MG capsule, Take 60 mg by mouth daily. , Disp: , Rfl:  .  furosemide (LASIX) 40 MG tablet, Take 2 tablets (80 mg total) by mouth 2 (two) times daily., Disp: 120 tablet, Rfl: 3 .  gabapentin (NEURONTIN) 600 MG tablet, Take 600 mg by mouth 3 (three) times daily. , Disp: , Rfl:  .  Guaifenesin (MUCINEX MAXIMUM STRENGTH) 1200 MG TB12, Take 1,200 mg by mouth 2 (two) times daily as needed (for cough or congestion). , Disp: , Rfl:  .  HYDROcodone-acetaminophen (NORCO/VICODIN) 5-325 MG per tablet, Take 1 tablet by mouth See admin instructions. EVERY 4-6 HOURS AS NEEDED FOR PAIN OR SHORTNESS OF BREATH, Disp: , Rfl:  .  insulin aspart (NOVOLOG FLEXPEN) 100 UNIT/ML FlexPen, Inject 5 Units into the skin 3 (three) times daily as needed for high blood sugar., Disp: , Rfl:  .  insulin glargine (LANTUS) 100 UNIT/ML injection, Inject 0.66 mLs (66 Units total) into the skin daily before breakfast. (Patient taking differently: Inject 64 Units into the skin daily before breakfast. ), Disp: , Rfl:  .  irbesartan (AVAPRO) 150 MG tablet, Take 1 tablet (150 mg total) by mouth daily., Disp: 90 tablet, Rfl: 3 .  iron polysaccharides (NIFEREX) 150 MG capsule, Take 1 capsule (150 mg total) by mouth 2 (two) times daily. (Patient taking differently: Take 150 mg by mouth daily. ), Disp: 60 capsule, Rfl: 0 .  levalbuterol (XOPENEX HFA) 45 MCG/ACT inhaler, Inhale 1-2 puffs into the lungs every 4 (four) hours as needed for wheezing. (Patient taking differently: Inhale 2 puffs into the lungs every 4 (four) hours as needed for wheezing or shortness of breath. ), Disp: 1 Inhaler, Rfl: 12 .  metolazone (ZAROXOLYN) 5 MG tablet, Take 5 mg by mouth 2 (two) times a week. Wednesday and Friday,  Disp: ,  Rfl:  .  Multiple Vitamin (MULTIVITAMIN WITH MINERALS) TABS tablet, Take 1 tablet by mouth daily., Disp: , Rfl:  .  omeprazole (PRILOSEC OTC) 20 MG tablet, Take 20 mg by mouth at bedtime., Disp: , Rfl:  .  OXYGEN, Inhale 5-8 L into the lungs See admin instructions. 5 liters when at rest and 8 liters when ambulatory, Disp: , Rfl:  .  polyethylene glycol (MIRALAX / GLYCOLAX) packet, Take 17 g by mouth daily. Mix in 8 oz liquid and drink, Disp: , Rfl:  .  polyvinyl alcohol (ARTIFICIAL TEARS) 1.4 % ophthalmic solution, Place 1 drop into both eyes 2 (two) times daily. , Disp: , Rfl:  .  potassium chloride (K-DUR,KLOR-CON) 10 MEQ tablet, Take 3 tablets (30 mEq total) by mouth 2 (two) times daily., Disp: 180 tablet, Rfl: 3 .  roflumilast (DALIRESP) 500 MCG TABS tablet, Take 500 mcg by mouth daily., Disp: , Rfl:  .  sodium chloride HYPERTONIC 3 % nebulizer solution, Take by nebulization 2 (two) times daily., Disp: 750 mL, Rfl: 11 .  SPIRIVA HANDIHALER 18 MCG inhalation capsule, INHALE THE CONTENTS OF 1 CAPSULE VIA HANDIHALER BY MOUTH EVERY MORNING, Disp: 30 capsule, Rfl: 11 .  SYMBICORT 160-4.5 MCG/ACT inhaler, INHALE 2 PUFFS BY MOUTH TWICE DAILY (Patient taking differently: Inhale 2 puffs into the lungs two times a day), Disp: 10.2 g, Rfl: 5 .  verapamil (VERELAN PM) 360 MG 24 hr capsule, TAKE ONE CAPSULE BY MOUTH DAILY, Disp: 15 capsule, Rfl: 0 .  XARELTO 20 MG TABS tablet, Take 20 mg by mouth daily with supper., Disp: , Rfl: 6

## 2017-04-07 DIAGNOSIS — R040 Epistaxis: Secondary | ICD-10-CM | POA: Diagnosis not present

## 2017-04-20 ENCOUNTER — Other Ambulatory Visit (HOSPITAL_COMMUNITY): Payer: Self-pay | Admitting: *Deleted

## 2017-04-20 MED ORDER — VERAPAMIL HCL ER 360 MG PO CP24: 360.0000 mg | ORAL_CAPSULE | Freq: Every day | ORAL | 3 refills | Status: DC

## 2017-04-27 ENCOUNTER — Other Ambulatory Visit (HOSPITAL_COMMUNITY): Payer: Self-pay | Admitting: *Deleted

## 2017-04-27 MED ORDER — FUROSEMIDE 40 MG PO TABS: 80.0000 mg | ORAL_TABLET | Freq: Two times a day (BID) | ORAL | 3 refills | Status: DC

## 2017-05-04 ENCOUNTER — Encounter (HOSPITAL_COMMUNITY): Payer: Self-pay | Admitting: Cardiology

## 2017-05-04 ENCOUNTER — Ambulatory Visit (HOSPITAL_COMMUNITY)
Admission: RE | Admit: 2017-05-04 | Discharge: 2017-05-04 | Disposition: A | Discharge status: Home / Self Care | Payer: Medicare Other | Source: Ambulatory Visit | Attending: Cardiology | Admitting: Cardiology

## 2017-05-04 VITALS — BP 119/49 | HR 124 | Wt 206.0 lb

## 2017-05-04 DIAGNOSIS — Z79899 Other long term (current) drug therapy: Secondary | ICD-10-CM | POA: Diagnosis not present

## 2017-05-04 DIAGNOSIS — I5032 Chronic diastolic (congestive) heart failure: Secondary | ICD-10-CM

## 2017-05-04 DIAGNOSIS — Z87891 Personal history of nicotine dependence: Secondary | ICD-10-CM | POA: Insufficient documentation

## 2017-05-04 DIAGNOSIS — Z7951 Long term (current) use of inhaled steroids: Secondary | ICD-10-CM | POA: Diagnosis not present

## 2017-05-04 DIAGNOSIS — Z8371 Family history of colonic polyps: Secondary | ICD-10-CM | POA: Insufficient documentation

## 2017-05-04 DIAGNOSIS — R000 Tachycardia, unspecified: Secondary | ICD-10-CM | POA: Diagnosis not present

## 2017-05-04 DIAGNOSIS — I110 Hypertensive heart disease with heart failure: Secondary | ICD-10-CM | POA: Insufficient documentation

## 2017-05-04 DIAGNOSIS — I272 Pulmonary hypertension, unspecified: Secondary | ICD-10-CM

## 2017-05-04 DIAGNOSIS — Z8379 Family history of other diseases of the digestive system: Secondary | ICD-10-CM | POA: Diagnosis not present

## 2017-05-04 DIAGNOSIS — Z8249 Family history of ischemic heart disease and other diseases of the circulatory system: Secondary | ICD-10-CM | POA: Diagnosis not present

## 2017-05-04 DIAGNOSIS — E669 Obesity, unspecified: Secondary | ICD-10-CM | POA: Insufficient documentation

## 2017-05-04 DIAGNOSIS — J449 Chronic obstructive pulmonary disease, unspecified: Secondary | ICD-10-CM | POA: Diagnosis not present

## 2017-05-04 DIAGNOSIS — Z6834 Body mass index (BMI) 34.0-34.9, adult: Secondary | ICD-10-CM | POA: Insufficient documentation

## 2017-05-04 DIAGNOSIS — G4733 Obstructive sleep apnea (adult) (pediatric): Secondary | ICD-10-CM | POA: Diagnosis not present

## 2017-05-04 DIAGNOSIS — Z808 Family history of malignant neoplasm of other organs or systems: Secondary | ICD-10-CM | POA: Diagnosis not present

## 2017-05-04 DIAGNOSIS — I480 Paroxysmal atrial fibrillation: Secondary | ICD-10-CM | POA: Diagnosis not present

## 2017-05-04 DIAGNOSIS — J849 Interstitial pulmonary disease, unspecified: Secondary | ICD-10-CM | POA: Insufficient documentation

## 2017-05-04 DIAGNOSIS — Z7901 Long term (current) use of anticoagulants: Secondary | ICD-10-CM | POA: Diagnosis not present

## 2017-05-04 DIAGNOSIS — Z794 Long term (current) use of insulin: Secondary | ICD-10-CM | POA: Insufficient documentation

## 2017-05-04 DIAGNOSIS — Z823 Family history of stroke: Secondary | ICD-10-CM | POA: Insufficient documentation

## 2017-05-04 DIAGNOSIS — Z9981 Dependence on supplemental oxygen: Secondary | ICD-10-CM | POA: Insufficient documentation

## 2017-05-04 DIAGNOSIS — E119 Type 2 diabetes mellitus without complications: Secondary | ICD-10-CM | POA: Insufficient documentation

## 2017-05-04 DIAGNOSIS — Z833 Family history of diabetes mellitus: Secondary | ICD-10-CM | POA: Insufficient documentation

## 2017-05-04 DIAGNOSIS — D509 Iron deficiency anemia, unspecified: Secondary | ICD-10-CM

## 2017-05-04 LAB — BASIC METABOLIC PANEL
ANION GAP: 9 (ref 5–15)
BUN: 20 mg/dL (ref 6–20)
CHLORIDE: 100 mmol/L — AB (ref 101–111)
CO2: 28 mmol/L (ref 22–32)
Calcium: 9.3 mg/dL (ref 8.9–10.3)
Creatinine, Ser: 1.11 mg/dL — ABNORMAL HIGH (ref 0.44–1.00)
GFR calc Af Amer: 56 mL/min — ABNORMAL LOW (ref >60)
GFR, EST NON AFRICAN AMERICAN: 48 mL/min — AB (ref >60)
Glucose, Bld: 126 mg/dL — ABNORMAL HIGH (ref 65–99)
POTASSIUM: 4.4 mmol/L (ref 3.5–5.1)
Sodium: 137 mmol/L (ref 135–145)

## 2017-05-04 LAB — CBC
HEMATOCRIT: 30.9 % — AB (ref 36.0–46.0)
HEMOGLOBIN: 8.4 g/dL — AB (ref 12.0–15.0)
MCH: 20 pg — ABNORMAL LOW (ref 26.0–34.0)
MCHC: 27.2 g/dL — ABNORMAL LOW (ref 30.0–36.0)
MCV: 73.6 fL — AB (ref 78.0–100.0)
Platelets: 356 10*3/uL (ref 150–400)
RBC: 4.2 MIL/uL (ref 3.87–5.11)
RDW: 19 % — AB (ref 11.5–15.5)
WBC: 13.3 10*3/uL — AB (ref 4.0–10.5)

## 2017-05-04 LAB — IRON AND TIBC
IRON: 30 ug/dL (ref 28–170)
SATURATION RATIOS: 7 % — AB (ref 10.4–31.8)
TIBC: 416 ug/dL (ref 250–450)
UIBC: 386 ug/dL

## 2017-05-04 LAB — FERRITIN: Ferritin: 8 ng/mL — ABNORMAL LOW (ref 11–307)

## 2017-05-04 NOTE — Patient Instructions (Signed)
Labs today We will only contact you if something comes back abnormal or we need to make some changes. Otherwise no news is good news!   Your physician recommends that you schedule a follow-up appointment in: 4 months with Dr McLean    Do the following things EVERYDAY: 1) Weigh yourself in the morning before breakfast. Write it down and keep it in a log. 2) Take your medicines as prescribed 3) Eat low salt foods-Limit salt (sodium) to 2000 mg per day.  4) Stay as active as you can everyday 5) Limit all fluids for the day to less than 2 liters   

## 2017-05-04 NOTE — Progress Notes (Signed)
Patient ID: Destiny Shelton, female   DOB: Apr 29, 1946, 71 y.o.   MRN: [id number]  PCP: Dr. Brigitte Pulse HF Cardiology: Dr. Aundra Dubin  71 yo with history of paroxysmal atrial fibrillation, chronic diastolic CHF with restrictive hemodynamics on 2013 RHC, COPD on home oxygen, inappropriate sinus tachycardia, and concern for intracardiac shunting presents for followup of diastolic CHF.  She has been followed for several years by both cardiology and pulmonology.  She is a prior smoker and has been diagnosed with COPD.  Interestingly, her PFTs in 10/16 showed improvement, suggesting mild obstructive airways disease.  However, she still requires home oxygen.  She had transcranial dopplers in 2013 suggesting a medium-sized right to left shunt.  This led to an extensive workup.  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  Cardiac MRI was unremarkable, no evidence for shunt lesion.  RHC/LHC showed no coronary disease but it did show restrictive hemodynamics and volume overload.  Qp/Qs from this study was 1.27/1, suggesting a relatively small left to right shunt.  A definite shunt lesion was never identified.    Echo in 2/17 showed normal LV size and systolic function and normal RV.  I also did a RHC in 2/17: on higher dose diuretics, she had mild pulmonary hypertension and normal PCWP.  There was no evidence for a shunt lesion with Qp/Qs 0.97.   She was admitted in 9/17 with acute exacerbation of COPD.  She was treated with steroids and nebs.  She was admitted in 12/17 with suspected COPD exacerbation, treated with steroids and levofloxacin.  Lasix was decreased to 80 mg once daily but I increased it back to 80 qam/40 qpm when I saw her in followup, later up to Lasix 80 mg bid.  Subsequently, metolazone was started twice a week.   In 8/18, she was admitted with PNA and severe epistaxis.  She was in the ER again with epistaxis in 10/18.  She subsequently saw an ENT with cautery done in her nose.  She has not  had epistaxis since then and is on Xarelto still.    She is still short of breath with mild exertion (walking around house).  She walks with a walker.  She really only gets out to see the doctor.  No chest pain.  No lightheadedness.  She has slept in a recliner long-term.  No BRBPR/melena.  She is on a course of prednisone, says this does help her breathing.  She is using oxygen 5L at rest, 8L with exertion. Weight is down 14 lbs.   Labs (9/16): digoxin 0.6, K 4.3, creatinine 0.85 Labs (1/17): K 3.9, creatinine 0.96, hgb 8.9 Labs (3/17): K 4.2, creatinine 0.96 Labs (6/17): K 4.2, creatinine 1.03 Labs (8/17): K 4.1, creatinine 0.81, HCT 34.3 Labs (9/17): K 3.7, creatinine 0.91, hgb 12.1 Labs (12/17): K 3.4, creatinine 0.84, hgb 13 Labs (1/18): K 4.2, creatinine 1.27 Labs (6/18): pro-BNP 42 Labs (7/18): K 4.5, creatinine 0.93 Labs (10/18): K 3.8, creatinine 1.38, hgb 8.1  ECG (personally reviewed): Sinus tachycardia with right axis deviation.   PMH: 1. Atrial fibrillation: Paroxysmal.  2. HTN 3. COPD: Home oxygen.  PFTs (10/16) with FVC 77%, FEV1 68%, TLC 89%.  Now severe.  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  4. Type II diabetes 5. Obesity 6. Inappropriate sinus tachycardia 7. OSA: Does not use CPAP, uses oxygen at night.  8. Chronic diastolic CHF: RHC/LHC in 7/32 with no significant CAD, mean RA 19, RV 46/18, PA 45/29 mean 37, mean PCWP 22,  CI 4.1, PVR 1.4 WU => restrictive hemodynamics with no evidence for constriction;  Qp/Qs 1.27/1; saturation run difficult to interpret.  TEE (10/13): EF 55-60%, LVH, mild MR, bubble study showed no evidence for immediate R=>L bubble crossing, small amount of bubbles ended up in left heart > 5 seconds after injection suggesting possible intrapulmonary shunting; no shunt by color doppler.  Cardiac MRI (8/13) with EF 59%, no LGE, normal RV size/systolic function => interpreted as normal.  RHC 07/04/2015: RA mean 9, RV 45/10, PA 41/20 mean 30, PCWP mean 13,  no step up on shunt run,cardiac output (Fick) 6.7 cardiac index (Fick) 3.3, PVR 2.5 WU, Qp/Qs = 0.97. Echo (2/17) with EF 55-60%, mild LVH, mild MR, normal RV size and systolic function.  9. ?Intracardiac shunt: Transcranial dopplers in 10/13 were suggestive of medium-sized right to left shunt.  Bubble study on TEE in 10/13 showed no evidence for immediate R=>L bubble crossing, small amount of bubbles ended up in left heart > 5 seconds after injection suggesting possible intrapulmonary shunting; no shunt by color doppler. Cardiac MRI showed no evidence for shunting.  Cardiac cath in 2013 actually was suggestive of a small left to right shunt.  Cardiac cath in 2/17 NOT suggestive of significant shunt, Qp/Qs 0.97.  10. Interstitial lung disease: HRCT in 1/18 was concerning for ILD, possibly scarring from prior respiratory infections.   SH: Married, prior heavy smoker (quit 1996).  No ETOH.   Family History  Problem Relation Age of Onset  . Heart disease Mother   . Stroke Mother   . Heart disease Father   . Heart disease Brother   . Stroke Brother   . Skin cancer Brother   . Colon polyps Sister   . Diabetes Sister   . Diabetes Brother   . Irritable bowel syndrome Daughter   . Colon cancer Neg Hx    ROS: All systems reviewed and negative except as per HPI.   Current Outpatient Medications  Medication Sig Dispense Refill  . acetaminophen (TYLENOL) 500 MG tablet Take 1 tablet (500 mg total) by mouth every 6 (six) hours as needed for mild pain or headache. 30 tablet 0  . albuterol (PROVENTIL) (2.5 MG/3ML) 0.083% nebulizer solution Take 3 mLs (2.5 mg total) by nebulization every 6 (six) hours as needed for wheezing or shortness of breath. 75 mL 3  . ALPRAZolam (XANAX) 0.5 MG tablet Take 0.25-0.5 mg by mouth daily as needed for anxiety.   4  . atorvastatin (LIPITOR) 40 MG tablet Take 40 mg by mouth at bedtime.     Marland Kitchen azithromycin (ZITHROMAX) 250 MG tablet 1 tab daily.  Start after finishing  Levaquin. (Patient taking differently: Take 250 mg by mouth daily. ) 30 tablet 2  . benzonatate (TESSALON) 200 MG capsule Take 400 mg by mouth at bedtime.     . budesonide-formoterol (SYMBICORT) 160-4.5 MCG/ACT inhaler Inhale 2 puffs into the lungs 2 (two) times daily. 2 Inhaler 0  . buPROPion (WELLBUTRIN XL) 300 MG 24 hr tablet Take 300 mg by mouth daily.    . DULoxetine (CYMBALTA) 60 MG capsule Take 60 mg by mouth daily.     . furosemide (LASIX) 40 MG tablet Take 2 tablets (80 mg total) by mouth 2 (two) times daily. 120 tablet 3  . gabapentin (NEURONTIN) 600 MG tablet Take 600 mg by mouth 3 (three) times daily.     . Guaifenesin (MUCINEX MAXIMUM STRENGTH) 1200 MG TB12 Take 1,200 mg by mouth 2 (two) times daily  as needed (for cough or congestion).     Marland Kitchen HYDROcodone-acetaminophen (NORCO/VICODIN) 5-325 MG per tablet Take 1 tablet by mouth See admin instructions. EVERY 4-6 HOURS AS NEEDED FOR PAIN OR SHORTNESS OF BREATH    . insulin aspart (NOVOLOG FLEXPEN) 100 UNIT/ML FlexPen Inject 5 Units into the skin 3 (three) times daily as needed for high blood sugar.    . insulin glargine (LANTUS) 100 UNIT/ML injection Inject 0.66 mLs (66 Units total) into the skin daily before breakfast. (Patient taking differently: Inject 64 Units into the skin daily before breakfast. )    . irbesartan (AVAPRO) 150 MG tablet Take 1 tablet (150 mg total) by mouth daily. 90 tablet 3  . iron polysaccharides (NIFEREX) 150 MG capsule Take 1 capsule (150 mg total) by mouth 2 (two) times daily. (Patient taking differently: Take 150 mg by mouth daily. ) 60 capsule 0  . levalbuterol (XOPENEX HFA) 45 MCG/ACT inhaler Inhale 1-2 puffs into the lungs every 4 (four) hours as needed for wheezing. (Patient taking differently: Inhale 2 puffs into the lungs every 4 (four) hours as needed for wheezing or shortness of breath. ) 1 Inhaler 12  . metolazone (ZAROXOLYN) 5 MG tablet Take 5 mg by mouth 2 (two) times a week. Wednesday and Friday    .  Multiple Vitamin (MULTIVITAMIN WITH MINERALS) TABS tablet Take 1 tablet by mouth daily.    Marland Kitchen omeprazole (PRILOSEC OTC) 20 MG tablet Take 20 mg by mouth at bedtime.    . OXYGEN Inhale 5-8 L into the lungs See admin instructions. 5 liters when at rest and 8 liters when ambulatory    . polyethylene glycol (MIRALAX / GLYCOLAX) packet Take 17 g by mouth daily. Mix in 8 oz liquid and drink    . polyvinyl alcohol (ARTIFICIAL TEARS) 1.4 % ophthalmic solution Place 1 drop into both eyes 2 (two) times daily.     . potassium chloride (K-DUR,KLOR-CON) 10 MEQ tablet Take 3 tablets (30 mEq total) by mouth 2 (two) times daily. 180 tablet 3  . predniSONE (DELTASONE) 10 MG tablet Take 20mg  daily X1 month, then 10mg  daily X1 month. 90 tablet 0  . roflumilast (DALIRESP) 500 MCG TABS tablet Take 500 mcg by mouth daily.    . sodium chloride HYPERTONIC 3 % nebulizer solution Take by nebulization 2 (two) times daily. 750 mL 11  . SPIRIVA HANDIHALER 18 MCG inhalation capsule INHALE THE CONTENTS OF 1 CAPSULE VIA HANDIHALER BY MOUTH EVERY MORNING 30 capsule 11  . SYMBICORT 160-4.5 MCG/ACT inhaler INHALE 2 PUFFS BY MOUTH TWICE DAILY (Patient taking differently: Inhale 2 puffs into the lungs two times a day) 10.2 g 5  . verapamil (VERELAN PM) 360 MG 24 hr capsule Take 1 capsule (360 mg total) daily by mouth. 30 capsule 3  . XARELTO 20 MG TABS tablet Take 20 mg by mouth daily with supper.  6   No current facility-administered medications for this encounter.    BP (!) 119/49 (BP Location: Right Arm, Patient Position: Sitting, Cuff Size: Large)   Pulse (!) 124   Wt 206 lb (93.4 kg)   SpO2 90% Comment: on 5L  BMI 34.28 kg/m  General: NAD Neck: No JVD, no thyromegaly or thyroid nodule.  Lungs: Dry crackles at bases bilaterally.  CV: Nondisplaced PMI.  Heart regular S1/S2, no S3/S4, no murmur.  No peripheral edema.  No carotid bruit.  Normal pedal pulses.  Abdomen: Soft, nontender, no hepatosplenomegaly, no distention.    Skin: Intact without lesions  or rashes.  Neurologic: Alert and oriented x 3.  Psych: Normal affect. Extremities: No clubbing or cyanosis.  HEENT: Normal.   Assessment/Plan: 1. COPD: On home oxygen, 5L at rest and 8L with exertion.  Severe COPD, Pseudomonas colonization.  Breathing has improved in the past with increased diuresis, suggesting that CHF plays a significant role in her symptoms in addition to COPD.  However, on exam today she looks euvolemic and weight is down.  I think that most of her dyspnea at this point is from lung disease.  She has ILD on CT that may be due to chronic scarring from prior respiratory infections.  Of note, she is currently on prednisone and says it makes her feel better.   2. Atrial fibrillation: Paroxysmal, CHADSVASC = 4.  NSR today. - She will continue Xarelto.  Recent epistaxis, now better with nasal cautery.  Will check Fe studies today, can give feraheme if low.  - She can continue verapamil.     3. Chronic diastolic CHF: NYHA class III symptoms, likely due to combination of CHF and COPD/ILD.  I suspect that her parenchymal lung disease, COPD and ILD, is the major contributor to her symptoms currently.  She looks near-euvolemic on exam.  Would not increase diuretics any further today.  - Continue Lasix 80 mg bid with metolazone twice a week. BMET today.  4. ?Shunt lesion: She had transcranial dopplers in 2013 suggesting a medium-sized right to left shunt.  This led to an extensive workup.  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  Cardiac MRI was unremarkable, no evidence for shunt lesion.  RHC/LHC in 2013 showed no coronary disease but it did show restrictive hemodynamics and volume overload.  Qp/Qs from this study was 1.27/1, suggesting a relatively small left to right shunt.  A definite shunt lesion was never identified.  Based on prior workup, it seems most likely that the right to left shunting by transcranial dopplers and TEE  bubble study was due to pulmonary AVMs.  RHC repeated in 2/17 did not show a significant shunt lesion, with Qp/Qs 0.97.  5. Inappropriate sinus tachycardia: Had discussed using Corlanor in the past but interacts with verapamil.  6. Pulmonary hypertension: Mild pulmonary hypertension on RHC with PVR only 2.5 WU.  Probably due to COPD/ILD (group 3) and elevated left atrial pressure (group 2).  No specific treatment other than diuresis.  7. Interstitial lung disease: May be due to lung scarring from prior respiratory infections.  Followed by Dr. Lake Bells.  Dyspnea gradually worsening over time.   Followup in 4 months.   Loralie Champagne 05/04/2017

## 2017-05-05 ENCOUNTER — Other Ambulatory Visit (HOSPITAL_COMMUNITY): Payer: Self-pay | Admitting: Pharmacist

## 2017-05-05 ENCOUNTER — Other Ambulatory Visit (HOSPITAL_COMMUNITY): Payer: Self-pay

## 2017-05-05 DIAGNOSIS — D509 Iron deficiency anemia, unspecified: Secondary | ICD-10-CM

## 2017-05-06 ENCOUNTER — Telehealth (HOSPITAL_COMMUNITY): Payer: Self-pay

## 2017-05-06 NOTE — Telephone Encounter (Signed)
Pt left message that she was unable to keep apt on Mon. I was able to have it rescheduled to Tuesday at 11 am. Her second apt is Thursday at 10 am. Left VM for her to call back. ------  Notes recorded by Adora Fridge, RPH-CPP on 05/05/2017 at 2:18 PM EST Orders entered for Feraheme 510 mg x 2 doses. ------  Notes recorded by Shirley Muscat, RN on 05/05/2017 at 12:05 PM EST Pt aware of results agreeable to come in. Has appt on 12/10 at 10 am.  Doroteo Bradford can you put in orders. Thank you ------  Notes recorded by Shirley Muscat, RN on 05/05/2017 at 11:20 AM EST Left VM  ------  Notes recorded by Larey Dresser, MD on 05/05/2017 at 10:11 AM EST Would arrange for feraheme infusion given Fe deficiency. ------  Notes recorded by Larey Dresser, MD on 05/04/2017 at 4:11 PM EST Stable labs

## 2017-05-06 NOTE — Telephone Encounter (Signed)
Pt aware of appointment time 

## 2017-05-11 ENCOUNTER — Encounter (HOSPITAL_COMMUNITY): Payer: Medicare Other

## 2017-05-12 ENCOUNTER — Ambulatory Visit (HOSPITAL_COMMUNITY): Admission: RE | Admit: 2017-05-12 | Payer: Medicare Other | Source: Ambulatory Visit

## 2017-05-15 ENCOUNTER — Encounter (HOSPITAL_COMMUNITY): Payer: Medicare Other

## 2017-05-15 DIAGNOSIS — Z6833 Body mass index (BMI) 33.0-33.9, adult: Secondary | ICD-10-CM | POA: Diagnosis not present

## 2017-05-15 DIAGNOSIS — I480 Paroxysmal atrial fibrillation: Secondary | ICD-10-CM | POA: Diagnosis not present

## 2017-05-15 DIAGNOSIS — E7849 Other hyperlipidemia: Secondary | ICD-10-CM | POA: Diagnosis not present

## 2017-05-15 DIAGNOSIS — I5032 Chronic diastolic (congestive) heart failure: Secondary | ICD-10-CM | POA: Diagnosis not present

## 2017-05-15 DIAGNOSIS — E1149 Type 2 diabetes mellitus with other diabetic neurological complication: Secondary | ICD-10-CM | POA: Diagnosis not present

## 2017-05-15 DIAGNOSIS — J449 Chronic obstructive pulmonary disease, unspecified: Secondary | ICD-10-CM | POA: Diagnosis not present

## 2017-05-15 DIAGNOSIS — E1129 Type 2 diabetes mellitus with other diabetic kidney complication: Secondary | ICD-10-CM | POA: Diagnosis not present

## 2017-05-15 DIAGNOSIS — J9611 Chronic respiratory failure with hypoxia: Secondary | ICD-10-CM | POA: Diagnosis not present

## 2017-05-15 DIAGNOSIS — M810 Age-related osteoporosis without current pathological fracture: Secondary | ICD-10-CM | POA: Diagnosis not present

## 2017-05-15 DIAGNOSIS — I2789 Other specified pulmonary heart diseases: Secondary | ICD-10-CM | POA: Diagnosis not present

## 2017-05-15 DIAGNOSIS — I10 Essential (primary) hypertension: Secondary | ICD-10-CM | POA: Diagnosis not present

## 2017-05-15 DIAGNOSIS — R040 Epistaxis: Secondary | ICD-10-CM | POA: Diagnosis not present

## 2017-05-18 ENCOUNTER — Encounter (HOSPITAL_COMMUNITY): Payer: Medicare Other

## 2017-05-18 ENCOUNTER — Ambulatory Visit (HOSPITAL_COMMUNITY)
Admission: RE | Admit: 2017-05-18 | Discharge: 2017-05-18 | Disposition: A | Discharge status: Home / Self Care | Payer: Medicare Other | Source: Ambulatory Visit | Attending: Cardiology | Admitting: Cardiology

## 2017-05-18 DIAGNOSIS — D509 Iron deficiency anemia, unspecified: Secondary | ICD-10-CM | POA: Insufficient documentation

## 2017-05-18 MED ORDER — SODIUM CHLORIDE 0.9 % IV SOLN
510.0000 mg | INTRAVENOUS | Status: DC
  Administered 2017-05-18: 11:00:00 510 mg via INTRAVENOUS
  Filled 2017-05-18: qty 17

## 2017-05-19 ENCOUNTER — Other Ambulatory Visit: Payer: Self-pay | Admitting: Pulmonary Disease

## 2017-05-28 ENCOUNTER — Ambulatory Visit (HOSPITAL_COMMUNITY)
Admission: RE | Admit: 2017-05-28 | Discharge: 2017-05-28 | Disposition: A | Discharge status: Home / Self Care | Payer: Medicare Other | Source: Ambulatory Visit | Attending: Cardiology | Admitting: Cardiology

## 2017-05-28 DIAGNOSIS — D509 Iron deficiency anemia, unspecified: Secondary | ICD-10-CM | POA: Diagnosis not present

## 2017-05-28 MED ORDER — SODIUM CHLORIDE 0.9 % IV SOLN
510.0000 mg | INTRAVENOUS | Status: AC
  Administered 2017-05-28: 510 mg via INTRAVENOUS
  Filled 2017-05-28: qty 17

## 2017-06-05 DIAGNOSIS — E11319 Type 2 diabetes mellitus with unspecified diabetic retinopathy without macular edema: Secondary | ICD-10-CM | POA: Diagnosis not present

## 2017-06-05 DIAGNOSIS — H35043 Retinal micro-aneurysms, unspecified, bilateral: Secondary | ICD-10-CM | POA: Diagnosis not present

## 2017-06-05 DIAGNOSIS — H18413 Arcus senilis, bilateral: Secondary | ICD-10-CM | POA: Diagnosis not present

## 2017-06-05 DIAGNOSIS — H11823 Conjunctivochalasis, bilateral: Secondary | ICD-10-CM | POA: Diagnosis not present

## 2017-06-05 DIAGNOSIS — Z961 Presence of intraocular lens: Secondary | ICD-10-CM | POA: Diagnosis not present

## 2017-06-05 DIAGNOSIS — H11423 Conjunctival edema, bilateral: Secondary | ICD-10-CM | POA: Diagnosis not present

## 2017-06-05 DIAGNOSIS — Z9849 Cataract extraction status, unspecified eye: Secondary | ICD-10-CM | POA: Diagnosis not present

## 2017-06-05 DIAGNOSIS — H5211 Myopia, right eye: Secondary | ICD-10-CM | POA: Diagnosis not present

## 2017-06-05 DIAGNOSIS — H11153 Pinguecula, bilateral: Secondary | ICD-10-CM | POA: Diagnosis not present

## 2017-06-05 DIAGNOSIS — H40023 Open angle with borderline findings, high risk, bilateral: Secondary | ICD-10-CM | POA: Diagnosis not present

## 2017-06-05 DIAGNOSIS — H04123 Dry eye syndrome of bilateral lacrimal glands: Secondary | ICD-10-CM | POA: Diagnosis not present

## 2017-06-05 DIAGNOSIS — H35033 Hypertensive retinopathy, bilateral: Secondary | ICD-10-CM | POA: Diagnosis not present

## 2017-06-08 ENCOUNTER — Other Ambulatory Visit: Payer: Self-pay | Admitting: Pulmonary Disease

## 2017-06-15 ENCOUNTER — Ambulatory Visit (INDEPENDENT_AMBULATORY_CARE_PROVIDER_SITE_OTHER): Payer: Medicare Other | Admitting: Pulmonary Disease

## 2017-06-15 ENCOUNTER — Encounter: Payer: Self-pay | Admitting: Pulmonary Disease

## 2017-06-15 VITALS — BP 108/60 | Ht 65.0 in | Wt 210.0 lb

## 2017-06-15 DIAGNOSIS — J432 Centrilobular emphysema: Secondary | ICD-10-CM

## 2017-06-15 DIAGNOSIS — J441 Chronic obstructive pulmonary disease with (acute) exacerbation: Secondary | ICD-10-CM

## 2017-06-15 LAB — PULMONARY FUNCTION TEST
DL/VA % pred: 26 %
DL/VA: 1.32 ml/min/mmHg/L
DLCO COR % PRED: 19 %
DLCO UNC % PRED: 20 %
DLCO cor: 4.99 ml/min/mmHg
DLCO unc: 5.21 ml/min/mmHg
FEF 25-75 POST: 0.86 L/s
FEF 25-75 PRE: 0.63 L/s
FEF2575-%CHANGE-POST: 35 %
FEF2575-%PRED-POST: 46 %
FEF2575-%Pred-Pre: 34 %
FEV1-%Change-Post: 15 %
FEV1-%Pred-Post: 65 %
FEV1-%Pred-Pre: 57 %
FEV1-PRE: 1.3 L
FEV1-Post: 1.5 L
FEV1FVC-%CHANGE-POST: 15 %
FEV1FVC-%Pred-Pre: 75 %
FEV6-%CHANGE-POST: 0 %
FEV6-%Pred-Post: 78 %
FEV6-%Pred-Pre: 77 %
FEV6-PRE: 2.25 L
FEV6-Post: 2.27 L
FEV6FVC-%Change-Post: 0 %
FEV6FVC-%PRED-PRE: 104 %
FEV6FVC-%Pred-Post: 104 %
FVC-%Change-Post: 0 %
FVC-%Pred-Post: 75 %
FVC-%Pred-Pre: 75 %
FVC-Post: 2.28 L
FVC-Pre: 2.29 L
POST FEV1/FVC RATIO: 66 %
Post FEV6/FVC ratio: 100 %
Pre FEV1/FVC ratio: 57 %
Pre FEV6/FVC Ratio: 99 %
RV % PRED: 126 %
RV: 2.9 L
TLC % pred: 96 %
TLC: 5 L

## 2017-06-15 MED ORDER — FLUTICASONE-UMECLIDIN-VILANT 100-62.5-25 MCG/INH IN AEPB: 1.0000 | INHALATION_SPRAY | Freq: Every day | RESPIRATORY_TRACT | 0 refills | Status: DC

## 2017-06-15 MED ORDER — TOBRAMYCIN 300 MG/5ML IN NEBU: 300.0000 mg | INHALATION_SOLUTION | Freq: Two times a day (BID) | RESPIRATORY_TRACT | 5 refills | Status: DC

## 2017-06-15 MED ORDER — PREDNISONE 5 MG PO TABS: 5.0000 mg | ORAL_TABLET | Freq: Every day | ORAL | 5 refills | Status: DC

## 2017-06-15 NOTE — Patient Instructions (Signed)
Severe emphysema/COPD: Trial of Trelegy, take 1 puff daily, do not take Spiriva or Symbicort while you are taking this medicine Call me and let me know if you think if Trelegy was helpful Continue azithromycin daily Continue using albuterol or Xopenex as needed for shortness of breath Start prednisone 5mg  daily indefinitely  Chronic bronchitis with chronic Pseudomonas infection: We will start inhaled tobramycin every other month  Chronic respiratory failure with hypoxemia: Continue 5 L of oxygen continuously, 8L with exertion  Follow up in 4-6 weeks with an NP

## 2017-06-15 NOTE — Progress Notes (Signed)
PFT completed today 06/15/17  

## 2017-06-15 NOTE — Progress Notes (Signed)
Subjective:    Patient ID: Destiny Shelton, female    DOB: 09-Oct-1945, 72 y.o.   MRN: [id number]  Synopsys: Former patient of Dr. Gwenette Greet with pulmonary hypertension, Afib, and COPD.  She smoked 2 packs per day for 25 years and quit in the 1990's.  She has been on oxygen since 2012.  As of 2017 She has been using 5L O2 at rest and 6 L with exertion.   She does not have a history of PE that she is aware of.    HPI Chief Complaint  Patient presents with  . Follow-up    review PFT.  pt c/o increased general body weakness, shakiness.  Also notes dyspnea, prod cough with green to clear mucus daily.     [place] has been coughing up thick brown mucus frequently.  She says it will change from dark green to white and then back to green.  She brings up mucus all day most days.  She says that the azithromycin helped slow it down but she still produces thick mucus daily.  She feels more congested some days and then she will produce a lot of mucus.  She also notes more sinus congestion.    In general she feels like her breathing is worse and she feels generally weak all over.  She says that just walking from one room to the next in her house will make her feel very short of breath.  She can't walk thorugh a store any more because of her dyspnea.    She has bene copmliant with her inahlers.   Past Medical History:  Diagnosis Date  . Adenomatous colon polyp   . ALLERGIC RHINITIS   . Anemia    iron deficient  . Anxiety   . Atrial tachycardia (HCC)    Mostly Sinus Tachycardia  . Back pain, chronic   . Carotid stenosis   . CHF (congestive heart failure) ([place])   . Community acquired pneumonia - Recent Admission (07/2013) 07/08/2013  . COPD (chronic obstructive pulmonary disease) ([place])    Requiring Home O2 at 4L  . Depression   . Diabetes mellitus type 2 with neurological manifestations ([place])    On Insulin  . Diverticulosis of colon   . External hemorrhoids   . Fibromyalgia    "pain in arms and  shoulders."  . Gastritis   . GERD (gastroesophageal reflux disease)   . Headache(784.0)    tension  . Hyperlipemia   . Hypertension   . Inappropriate sinus tachycardia   . Morbidly obese ([place])   . Nephrolithiasis   . Neuromuscular disorder ([place])    diabetic neuropathy  . Osteoarthritis   . Osteoporosis   . PAF (paroxysmal atrial fibrillation) ([place])   . 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  . Sleep apnea    no cpap machine  02 at 4l/min all the time  . Urosepsis 05/26/2012      Review of Systems  Constitutional: Negative for chills, diaphoresis, fatigue and fever.  HENT: Negative for postnasal drip, rhinorrhea and sinus pressure.   Respiratory: Positive for cough, shortness of breath and wheezing.   Cardiovascular: Positive for leg swelling. Negative for chest pain and palpitations.       Objective:   Physical Exam Vitals:   06/15/17 1118  BP: 108/60  SpO2: 90%  Weight: 210 lb (95.3 kg)  Height: _0  (1.651 m)   5L [place]   Gen: chronically ill appearing HENT: OP clear, TM's clear, neck supple PULM: Crackles bases, no  wheezing B, normal percussion CV: RRR, no mgr, trace edema GI: BS+, soft, nontender Derm: no cyanosis or rash Psyche: normal mood and affect     BMET    Component Value Date/Time   NA 137 05/04/2017 1108   K 4.4 05/04/2017 1108   CL 100 (L) 05/04/2017 1108   CO2 28 05/04/2017 1108   GLUCOSE 126 (H) 05/04/2017 1108   BUN 20 05/04/2017 1108   CREATININE 1.11 (H) 05/04/2017 1108   CREATININE 0.85 02/12/2015 0949   CALCIUM 9.3 05/04/2017 1108   GFRNONAA 48 (L) 05/04/2017 1108   GFRAA 56 (L) 05/04/2017 1108   Microbiology  BAL February 2018 Pseudomonas  PFT: PFT 2011:  FEV1 1.34 (55%), ratio 68, ++airtrapping on lung volumes, nl TLC, DLCO 40% pred. PFT's  03/23/2015  FEV1 1.61 (68 % ) ratio 69  p no % improvement from saba on   Symb/spiriva January 2018 pulmonary function testing ratio 70%, FEV1 1.63 L 70% predicted, FVC 2.33 L 76% predicted, total  lung capacity 4.48 L 86% predicted, DLCO 5.04 21 percent predicted January 2019 PFT: Ratio 66%, FEV1 1.50 L, 65%, FEV1 2.28 L 75% predicted, total lung capacity 5.0 L 96% predicted, DLCO 5.21 mL 20% predicted   Heart Cath: LHC 12/2011: non-obstructive cad RHC 12/2011:  PA 45/29 with mean 56m, PCWP 22, CO Fick 10.25, PVR 1.17 wood units, slight step up in saturation from   RV to PA 07/04/2015 RHC: RA mean 9 RV 45/10 PA 41/20, mean 30 PCWP mean 13  Oxygen saturations: SVC 68% RA 65% RV 61% PA 69% LA 98% Peripheral sat 93%  Cardiac Output (Fick) 6.7  Cardiac Index (Fick) 3.3 PVR 2.5 WU Qp/Qs = 0.97 .  Imaging: CT chest 12/2011:  No PE, very mild mosaic perfusion abnl (prob secondary to her known airflow obstruction).  January 2018 high-resolution CT scan of the chest shows air trapping, irregular distribution upper lobe fibrotic changes (interlobular septal thickening, some patchy ground glass) in a patchy distribution not consistent with usual interstitial pneumonitis  Cardiac imaging: Myoview 2012: no ischemia Echo 2012: nl LV and EF, impaired relaxation, nothing to suggest pulm htn. Echo 76/20/3559  Nl LV, diastolic dysfxn, normal RV, estimated RVSP 46 TEE 03/2012:  A few late bubbles seen felt c/w small IP shunting.  Cardiac MRI 2013:  No infiltrative process.        Assessment & Plan:    No diagnosis found.   Discussion: MSamaiyahsays that taking prednisone consistently was helpful.  She thinks that staying on this would be something that would help her in the long run.  However, she says that she is still continuing to produce a lot of mucus.  Her lung function testing showed no evidence of progression.  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  She has recurrent exacerbations.  Despite taking azithromycin she remains quite symptomatic.  Prednisone was helpful.  I  believe that she would benefit from inhaled tobramycin given the severity of her symptoms.  Plan: Severe emphysema/COPD: Trial of Trelegy, take 1 puff daily, do not take Spiriva or Symbicort while you are taking this medicine Call me and let me know if you think if Trelegy was helpful Continue azithromycin daily Continue using albuterol or Xopenex as needed for shortness of breath Start prednisone 557mdaily indefinitely  Chronic bronchitis with chronic Pseudomonas infection: We will start inhaled tobramycin  every other month  Chronic respiratory failure with hypoxemia: Continue 5 L of oxygen continuously, 8L with exertion  Follow up in 4-6 weeks with an NP   Current Outpatient Medications:  .  acetaminophen (TYLENOL) 500 MG tablet, Take 1 tablet (500 mg total) by mouth every 6 (six) hours as needed for mild pain or headache., Disp: 30 tablet, Rfl: 0 .  albuterol (PROVENTIL) (2.5 MG/3ML) 0.083% nebulizer solution, Take 3 mLs (2.5 mg total) by nebulization every 6 (six) hours as needed for wheezing or shortness of breath., Disp: 75 mL, Rfl: 3 .  ALPRAZolam (XANAX) 0.5 MG tablet, Take 0.25-0.5 mg by mouth daily as needed for anxiety. , Disp: , Rfl: 4 .  atorvastatin (LIPITOR) 40 MG tablet, Take 40 mg by mouth at bedtime. , Disp: , Rfl:  .  azithromycin (ZITHROMAX) 250 MG tablet, TAKE 1 TABLET BY MOUTH DAILY. START AFTER FINISHING LEVAQUIN, Disp: 90 tablet, Rfl: 3 .  benzonatate (TESSALON) 200 MG capsule, Take 400 mg by mouth at bedtime. , Disp: , Rfl:  .  budesonide-formoterol (SYMBICORT) 160-4.5 MCG/ACT inhaler, Inhale 2 puffs into the lungs 2 (two) times daily., Disp: 2 Inhaler, Rfl: 0 .  buPROPion (WELLBUTRIN XL) 300 MG 24 hr tablet, Take 300 mg by mouth daily., Disp: , Rfl:  .  DULoxetine (CYMBALTA) 60 MG capsule, Take 60 mg by mouth daily. , Disp: , Rfl:  .  furosemide (LASIX) 40 MG tablet, Take 2 tablets (80 mg total) by mouth 2 (two) times daily., Disp: 120 tablet, Rfl: 3 .   gabapentin (NEURONTIN) 600 MG tablet, Take 600 mg by mouth 3 (three) times daily. , Disp: , Rfl:  .  Guaifenesin (MUCINEX MAXIMUM STRENGTH) 1200 MG TB12, Take 1,200 mg by mouth 2 (two) times daily as needed (for cough or congestion). , Disp: , Rfl:  .  HYDROcodone-acetaminophen (NORCO/VICODIN) 5-325 MG per tablet, Take 1 tablet by mouth See admin instructions. EVERY 4-6 HOURS AS NEEDED FOR PAIN OR SHORTNESS OF BREATH, Disp: , Rfl:  .  insulin aspart (NOVOLOG FLEXPEN) 100 UNIT/ML FlexPen, Inject 5 Units into the skin 3 (three) times daily as needed for high blood sugar., Disp: , Rfl:  .  insulin glargine (LANTUS) 100 UNIT/ML injection, Inject 0.66 mLs (66 Units total) into the skin daily before breakfast. (Patient taking differently: Inject 64 Units into the skin daily before breakfast. ), Disp: , Rfl:  .  irbesartan (AVAPRO) 150 MG tablet, Take 1 tablet (150 mg total) by mouth daily., Disp: 90 tablet, Rfl: 3 .  iron polysaccharides (NIFEREX) 150 MG capsule, Take 1 capsule (150 mg total) by mouth 2 (two) times daily. (Patient taking differently: Take 150 mg by mouth daily. ), Disp: 60 capsule, Rfl: 0 .  levalbuterol (XOPENEX HFA) 45 MCG/ACT inhaler, Inhale 1-2 puffs into the lungs every 4 (four) hours as needed for wheezing. (Patient taking differently: Inhale 2 puffs into the lungs every 4 (four) hours as needed for wheezing or shortness of breath. ), Disp: 1 Inhaler, Rfl: 12 .  metolazone (ZAROXOLYN) 5 MG tablet, Take 5 mg by mouth 2 (two) times a week. Wednesday and Friday, Disp: , Rfl:  .  Multiple Vitamin (MULTIVITAMIN WITH MINERALS) TABS tablet, Take 1 tablet by mouth daily., Disp: , Rfl:  .  omeprazole (PRILOSEC OTC) 20 MG tablet, Take 20 mg by mouth at bedtime., Disp: , Rfl:  .  OXYGEN, Inhale 5-8 L into the lungs See admin instructions. 5 liters when at rest and 8 liters when  ambulatory, Disp: , Rfl:  .  polyethylene glycol (MIRALAX / GLYCOLAX) packet, Take 17 g by mouth daily. Mix in 8 oz liquid  and drink, Disp: , Rfl:  .  polyvinyl alcohol (ARTIFICIAL TEARS) 1.4 % ophthalmic solution, Place 1 drop into both eyes 2 (two) times daily. , Disp: , Rfl:  .  potassium chloride (K-DUR,KLOR-CON) 10 MEQ tablet, Take 3 tablets (30 mEq total) by mouth 2 (two) times daily., Disp: 180 tablet, Rfl: 3 .  predniSONE (DELTASONE) 10 MG tablet, TAKE 2 TABLETS BY MOUTH EVERY DAY X 1 MONTH THEN 1 TABLET BY MOUTH X 1 MONTH, Disp: 90 tablet, Rfl: 0 .  roflumilast (DALIRESP) 500 MCG TABS tablet, Take 500 mcg by mouth daily., Disp: , Rfl:  .  sodium chloride HYPERTONIC 3 % nebulizer solution, Take by nebulization 2 (two) times daily., Disp: 750 mL, Rfl: 11 .  SPIRIVA HANDIHALER 18 MCG inhalation capsule, INHALE THE CONTENTS OF 1 CAPSULE VIA HANDIHALER BY MOUTH EVERY MORNING, Disp: 30 capsule, Rfl: 11 .  SYMBICORT 160-4.5 MCG/ACT inhaler, INHALE 2 PUFFS BY MOUTH TWICE DAILY (Patient taking differently: Inhale 2 puffs into the lungs two times a day), Disp: 10.2 g, Rfl: 5 .  verapamil (VERELAN PM) 360 MG 24 hr capsule, Take 1 capsule (360 mg total) daily by mouth., Disp: 30 capsule, Rfl: 3 .  XARELTO 20 MG TABS tablet, Take 20 mg by mouth daily with supper., Disp: , Rfl: 6

## 2017-06-16 ENCOUNTER — Other Ambulatory Visit: Payer: Medicare Other

## 2017-06-16 ENCOUNTER — Telehealth: Payer: Self-pay | Admitting: Pulmonary Disease

## 2017-06-16 DIAGNOSIS — J441 Chronic obstructive pulmonary disease with (acute) exacerbation: Secondary | ICD-10-CM | POA: Diagnosis not present

## 2017-06-16 NOTE — Telephone Encounter (Signed)
Received faxes from pt's pharmacy. Her insurance is not covering Symbicort or Tobramycin. Both PA's were submitted through Cover My Meds. Symbicort Key: [id number] Tobramycin Key: [id number] Both PA's have been sent to the plan and will take 72 hours to be processed. Will send to [facility] for follow up.

## 2017-06-16 NOTE — Telephone Encounter (Signed)
Remo Lipps called to check on PA for tobramycin.Ref [phone] Advised him the PA was sent again today and denied due to wrong diagnosis code Richardson Landry advised that he is trying to have PA go through Part B of insurance Also he advised he will change the diagnosis code to J44.9 COPD to be approved He is calling Medicare today with this new information for this medication to be approved He advised he will call back to let us know the status

## 2017-06-17 DIAGNOSIS — S0081XA Abrasion of other part of head, initial encounter: Secondary | ICD-10-CM | POA: Diagnosis not present

## 2017-06-17 DIAGNOSIS — D62 Acute posthemorrhagic anemia: Secondary | ICD-10-CM | POA: Diagnosis not present

## 2017-06-17 DIAGNOSIS — Z7901 Long term (current) use of anticoagulants: Secondary | ICD-10-CM | POA: Diagnosis not present

## 2017-06-17 DIAGNOSIS — J449 Chronic obstructive pulmonary disease, unspecified: Secondary | ICD-10-CM | POA: Diagnosis not present

## 2017-06-17 DIAGNOSIS — I10 Essential (primary) hypertension: Secondary | ICD-10-CM | POA: Diagnosis not present

## 2017-06-17 DIAGNOSIS — E1149 Type 2 diabetes mellitus with other diabetic neurological complication: Secondary | ICD-10-CM | POA: Diagnosis not present

## 2017-06-17 DIAGNOSIS — S50819A Abrasion of unspecified forearm, initial encounter: Secondary | ICD-10-CM | POA: Diagnosis not present

## 2017-06-17 DIAGNOSIS — I4891 Unspecified atrial fibrillation: Secondary | ICD-10-CM | POA: Diagnosis not present

## 2017-06-17 DIAGNOSIS — Y92009 Unspecified place in unspecified non-institutional (private) residence as the place of occurrence of the external cause: Secondary | ICD-10-CM | POA: Diagnosis not present

## 2017-06-17 DIAGNOSIS — M25461 Effusion, right knee: Secondary | ICD-10-CM | POA: Diagnosis not present

## 2017-06-17 NOTE — Telephone Encounter (Signed)
Called [facility] and provided additional info for inhaled tobramycin as requested.  Will await approval.

## 2017-06-17 NOTE — Telephone Encounter (Signed)
[facility] the Diagnosis code J44.9 was the non qualifying code. They can not bill it through Medicare. They need another diagnosis if they don't have another code the dr. Hazel Sams to put in a prior authorization to medicare part B  They faxed over the form 06/15/17    [phone]    Ref # [id number]

## 2017-06-18 MED ORDER — TOBRAMYCIN 300 MG/5ML IN NEBU: 300.0000 mg | INHALATION_SOLUTION | Freq: Two times a day (BID) | RESPIRATORY_TRACT | 5 refills | Status: DC

## 2017-06-18 NOTE — Telephone Encounter (Signed)
Outcome  N/A on January 15  This medication is on your plan's list of covered drugs. Prior authorization is not required at this time. If your pharmacy has questions regarding the processing of your prescription, please have them call the [facility] pharmacy help desk at [phone]. For additional information, the member can contact Member Services by calling the number on the back of their ID Card. ---------------- PA for symbicort not needed, covered on pt's drug plan.  Checked status of Tobramycin PA on [email or web address], PA was denied d/t not meeting clinical requirements.  Called [facility] and spoke with Eddie Dibbles for clarification- states that this medication is to be ran by pharmacy under part B and not part D.  Resent rx to be filed under part B.  Will follow up on.

## 2017-06-19 ENCOUNTER — Telehealth: Payer: Self-pay | Admitting: Pulmonary Disease

## 2017-06-19 LAB — RESPIRATORY CULTURE OR RESPIRATORY AND SPUTUM CULTURE
MICRO NUMBER: 90060304
SPECIMEN QUALITY:: ADEQUATE

## 2017-06-19 MED ORDER — TOBRAMYCIN 300 MG/5ML IN NEBU: 300.0000 mg | INHALATION_SOLUTION | Freq: Two times a day (BID) | RESPIRATORY_TRACT | 5 refills | Status: DC

## 2017-06-19 NOTE — Telephone Encounter (Signed)
Destiny Shelton from [employer] is calling because recd revised RX for tobramycin this morning with new diagnosis code of J44.9 with noted at the bottom to bill Medicare part B, this is only covered for cystic fibrosis, so will need PA.  CB [phone].

## 2017-06-19 NOTE — Telephone Encounter (Signed)
Tobramycin neb is being denied d/t pt not having a dx of cystic fibrosis.  BQ please advise on what needs to be included in appeal letter.  Thanks!

## 2017-06-19 NOTE — Telephone Encounter (Signed)
Remo Lipps called to check on PA for tobramycin.Ref [phone] Advised him the PA was sent again today and denied due to wrong diagnosis code Richardson Landry advised that he is trying to have PA go through Part B of insurance Also he advised he will change the diagnosis code to J44.9 COPD to be approved He is calling Medicare today with this new information for this medication to be approved He advised he will call back to let us know the status    Re-sent Rx with DX code on the RX due to the phone not being able to get pass the prompts to speak with someone. Nothing further is needed.

## 2017-06-23 ENCOUNTER — Telehealth: Payer: Self-pay | Admitting: Pulmonary Disease

## 2017-06-23 NOTE — Telephone Encounter (Signed)
Spoke with pt, she states the Trelegy is not working for her. She is still SOB and it doesn't last long. She is having SOB with exertion.She has been using Spiriva and Symbicort again last Tuesday. She states this combination works better for her. She wanted to let BQ know because she states BQ wanted to know if this was helping her. BQ please advise.

## 2017-06-25 DIAGNOSIS — Z96651 Presence of right artificial knee joint: Secondary | ICD-10-CM | POA: Diagnosis not present

## 2017-06-25 DIAGNOSIS — M25561 Pain in right knee: Secondary | ICD-10-CM | POA: Diagnosis not present

## 2017-06-26 NOTE — Telephone Encounter (Signed)
Is it possible to send this to [facility]?

## 2017-06-26 NOTE — Telephone Encounter (Signed)
OK to resume Spiriva and Symbicort.  If she needs refills on these please offer.

## 2017-06-26 NOTE — Telephone Encounter (Signed)
Called and spoke to pt. Informed her of the recs per BQ. Pt verbalized understanding and denied needing refills at this time. Will sign off.

## 2017-06-26 NOTE — Telephone Encounter (Signed)
Called and spoke with Destiny Shelton with [facility] [phone] regarding tobramycin nebulizer medication Transferred to cystic fibrosis dept at phone [phone], spoke with Delton See advised that [facility] is not able to help with this medication for the pt, as it is being denied by insurance  Routing message to BQ  BQ please advise if you are requesting appeal letter to be completed, and what is needing included in the letter

## 2017-07-07 NOTE — Telephone Encounter (Signed)
To whom it may concern:  Ms. Destiny Shelton has advanced COPD, chronic Pseudomonas colonization, chronic respiratory failure with hypoxemia, and frequent exacerbations of her underlying lung disease.  She failed treatment with roflumilast.  We have been unable to control the severity and frequency of her exacerbations of COPD.  I believe that inhaled tobramycin would help control her Pseudomonas colonization and prevent recurrent hospital and doctor's visits for her severe COPD.  Please consider approval for inhaled tobramycin to be used on an every other month basis.

## 2017-07-07 NOTE — Telephone Encounter (Signed)
Appeal letter has been written. I contacted [facility] requesting appeals departments number and was advised that pt can not be found within their system. called [facility] and was advised that appeals department fax number was [phone]. It appears that this fax number is incorrect.  Will f/u on 07/08/17

## 2017-07-11 ENCOUNTER — Other Ambulatory Visit: Payer: Self-pay | Admitting: Pulmonary Disease

## 2017-07-13 ENCOUNTER — Ambulatory Visit (INDEPENDENT_AMBULATORY_CARE_PROVIDER_SITE_OTHER): Payer: Medicare Other | Admitting: Adult Health

## 2017-07-13 ENCOUNTER — Encounter: Payer: Self-pay | Admitting: Adult Health

## 2017-07-13 DIAGNOSIS — I272 Pulmonary hypertension, unspecified: Secondary | ICD-10-CM

## 2017-07-13 DIAGNOSIS — J9611 Chronic respiratory failure with hypoxia: Secondary | ICD-10-CM

## 2017-07-13 DIAGNOSIS — J849 Interstitial pulmonary disease, unspecified: Secondary | ICD-10-CM

## 2017-07-13 DIAGNOSIS — I5032 Chronic diastolic (congestive) heart failure: Secondary | ICD-10-CM | POA: Diagnosis not present

## 2017-07-13 DIAGNOSIS — J441 Chronic obstructive pulmonary disease with (acute) exacerbation: Secondary | ICD-10-CM | POA: Diagnosis not present

## 2017-07-13 NOTE — Progress Notes (Signed)
_0  ID: Crissie Figures, female    DOB: 05-04-1946, 72 y.o.   MRN: [id number]  Chief Complaint  Patient presents with  . Follow-up    COPD     Referring provider: Marton Redwood, MD  HPI: 72 year old female former smoker followed for COPD, pulmonary hypertension and ILD, O2 RF  And Chronic Bronchitis with chronic Pseudomonas Infection   TEST  06/2016 HRCT Chest >Fibrotic interstitial lung disease characterized by patchy peribronchovascular and subpleural reticulation, mild traction bronchiectasis, mild architectural distortion, mosaic attenuation with air trapping and scattered predominantly centrilobular nodularity. No frank honeycombing. No clear basilar gradient. Findings have progressed compared to 11-03-2011 chest CT, with minimal interval progression since 01/22/2016. Findings are favored represent chronic hypersensitivity pneumonitis. Findings are not consistent with usual interstitial pneumonia (UIP). 3. Mild centrilobular and paraseptal emphysema with mild diffuse bronchial wall thickening, suggesting COPD . pfts 2011: FEV1 1.34 (55%), ratio 68, ++airtrapping on lung volumes, nl TLC, DLCO 40% pred. PFT's 03/23/2015 FEV1 1.61 (68 % ) ratio 69 p no % improvement from saba on Symb/spiriva  Echo 2012: nl LV and EF, impaired relaxation, nothing to suggest pulm htn. Myoview 2012: no ischemia LHC 12/2011: non-obstructive cad RHC 12/2011: PA 45/29 with mean 83m, PCWP 22, CO Fick 10.25, PVR 1.17 wood units, slight step up in saturation from RV to PA [place]7/2013: Equal 4 chamber pressures, but no square root sign.  Echo 76/29/5284 Nl LV, diastolic dysfxn, normal RV, estimated RVSP 46.  TEE 03/2012: A few late bubbles seen felt c/w small IP shunting.  CT chest 12/2011: No PE, very mild mosaic perfusion abnl (prob secondary to her known airflow obstruction).  Cardiac MRI 2013: No infiltrative process.  TCD bubble study 03/2012: + for  medium intracardiac right to left shunt.  07/04/2015 RHC: RA mean 9 RV 45/10 PA 41/20, mean 30 PCWP mean 13 Oxygen saturations: SVC 68% RA 65% RV 61% PA 69% LA 98% Peripheral sat 93% Cardiac Output (Fick) 6.7 Cardiac Index (Fick) 3.3 PVR 2.5 WU Qp/Qs = 0.97 06/2016 HRCT>Upper lobe predominant fibrotic change and with centrilobular emphysema, no honeycombing January 2018 pulmonary function testing ratio 70%, FEV1 1.63 L 70% predicted, FVC 2.33 L 76% predicted, total lung capacity 4.48 L 86% predicted, DLCO 5. 09/06/2019 percent predicted BAL 07/2016 >+few psuedomonas-pan sens   07/13/2017 Follow up : COPD /ILD /O2 RF /Pulmonary HTN , Chronic Bronchitis  Patient returns for a one-month follow-up.  Last visit patient was changed from Symbicort and Spiriva to [place].  Patient says she did not like the new inhaler.  She went back to Symbicort and Spiriva.  Patient has frequent COPD exacerbations despite being on maximum therapy.  Last visit patient was started on prednisone 5 mg daily.  Patient says since last visit patient is feeling improved with more energy and less dyspnea.  She denies any increased cough or congestion.  She remains on Spiriva Symbicort.  She is on azithromycin daily.  And she uses  albuterol nebulizers as needed.    Patient was recommended to begin on inhaled tobramycin every other month.  However insurance has denied.  It is currently being appealed for chronic bronchitis with chronic Pseudomonas infection.  Patient remains on oxygen 5 L at rest and 8 L with activity is on continuous flow only.  She says she is doing well with her oxygen.  Her husband helps her.  They have to carry several tanks if they go outside the house due to her high flow of  oxygen needs.    Allergies  Allergen Reactions  . Aspirin Shortness Of Breath  . Nsaids Shortness Of Breath    Immunization History  Administered Date(s) Administered  . Influenza Split 02/17/2011, 02/19/2012, 02/24/2015  .  Influenza, High Dose Seasonal PF 03/10/2016, 03/26/2017  . Influenza,inj,Quad PF,6+ Mos 03/08/2013  . Influenza-Unspecified 01/31/2014  . Pneumococcal Conjugate-13 08/18/2008    Past Medical History:  Diagnosis Date  . Adenomatous colon polyp   . ALLERGIC RHINITIS   . Anemia    iron deficient  . Anxiety   . Atrial tachycardia (HCC)    Mostly Sinus Tachycardia  . Back pain, chronic   . Carotid stenosis   . CHF (congestive heart failure) ([place])   . Community acquired pneumonia - Recent Admission (07/2013) 07/08/2013  . COPD (chronic obstructive pulmonary disease) ([place])    Requiring Home O2 at 4L  . Depression   . Diabetes mellitus type 2 with neurological manifestations ([place])    On Insulin  . Diverticulosis of colon   . External hemorrhoids   . Fibromyalgia    "pain in arms and shoulders."  . Gastritis   . GERD (gastroesophageal reflux disease)   . Headache(784.0)    tension  . Hyperlipemia   . Hypertension   . Inappropriate sinus tachycardia   . Morbidly obese ([place])   . Nephrolithiasis   . Neuromuscular disorder ([place])    diabetic neuropathy  . Osteoarthritis   . Osteoporosis   . PAF (paroxysmal atrial fibrillation) ([place])   . 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  . Sleep apnea    no cpap machine  02 at 4l/min all the time  . Urosepsis 05/26/2012    Tobacco History: Social History   Tobacco Use  Smoking Status Former Smoker  . Packs/day: 2.00  . Years: 30.00  . Pack years: 60.00  . Types: Cigarettes  . Last attempt to quit: 06/02/1994  . Years since quitting: 23.1  Smokeless Tobacco Never Used   Counseling given: Not Answered   Outpatient Encounter Medications as of 07/13/2017  Medication Sig  . acetaminophen (TYLENOL) 500 MG tablet Take 1 tablet (500 mg total) by mouth every 6 (six) hours as needed for mild pain or headache.  . albuterol (PROVENTIL) (2.5 MG/3ML) 0.083% nebulizer solution Take 3 mLs (2.5 mg total) by nebulization every 6 (six) hours as needed for wheezing  or shortness of breath.  . ALPRAZolam (XANAX) 0.5 MG tablet Take 0.25-0.5 mg by mouth daily as needed for anxiety.   Marland Kitchen atorvastatin (LIPITOR) 40 MG tablet Take 40 mg by mouth at bedtime.   Marland Kitchen azithromycin (ZITHROMAX) 250 MG tablet TAKE 1 TABLET BY MOUTH DAILY. START AFTER FINISHING LEVAQUIN  . benzonatate (TESSALON) 200 MG capsule Take 400 mg by mouth at bedtime.   . budesonide-formoterol (SYMBICORT) 160-4.5 MCG/ACT inhaler Inhale 2 puffs into the lungs 2 (two) times daily.  Marland Kitchen buPROPion (WELLBUTRIN XL) 300 MG 24 hr tablet Take 300 mg by mouth daily.  . DULoxetine (CYMBALTA) 60 MG capsule Take 60 mg by mouth daily.   . furosemide (LASIX) 40 MG tablet Take 2 tablets (80 mg total) by mouth 2 (two) times daily.  Marland Kitchen gabapentin (NEURONTIN) 600 MG tablet Take 600 mg by mouth 3 (three) times daily.   . Guaifenesin (MUCINEX MAXIMUM STRENGTH) 1200 MG TB12 Take 1,200 mg by mouth 2 (two) times daily as needed (for cough or congestion).   Marland Kitchen HYDROcodone-acetaminophen (NORCO/VICODIN) 5-325 MG per tablet Take 1 tablet by mouth See admin instructions. EVERY 4-6  HOURS AS NEEDED FOR PAIN OR SHORTNESS OF BREATH  . insulin aspart (NOVOLOG FLEXPEN) 100 UNIT/ML FlexPen Inject 5 Units into the skin 3 (three) times daily as needed for high blood sugar.  . insulin glargine (LANTUS) 100 UNIT/ML injection Inject 0.66 mLs (66 Units total) into the skin daily before breakfast. (Patient taking differently: Inject 64 Units into the skin daily before breakfast. )  . irbesartan (AVAPRO) 150 MG tablet Take 1 tablet (150 mg total) by mouth daily.  . iron polysaccharides (NIFEREX) 150 MG capsule Take 1 capsule (150 mg total) by mouth 2 (two) times daily. (Patient taking differently: Take 150 mg by mouth daily. )  . levalbuterol (XOPENEX HFA) 45 MCG/ACT inhaler Inhale 1-2 puffs into the lungs every 4 (four) hours as needed for wheezing. (Patient taking differently: Inhale 2 puffs into the lungs every 4 (four) hours as needed for wheezing  or shortness of breath. )  . metolazone (ZAROXOLYN) 5 MG tablet Take 5 mg by mouth 2 (two) times a week. Wednesday and Friday  . Multiple Vitamin (MULTIVITAMIN WITH MINERALS) TABS tablet Take 1 tablet by mouth daily.  Marland Kitchen omeprazole (PRILOSEC OTC) 20 MG tablet Take 20 mg by mouth at bedtime.  . OXYGEN Inhale 5-8 L into the lungs See admin instructions. 5 liters when at rest and 8 liters when ambulatory  . polyethylene glycol (MIRALAX / GLYCOLAX) packet Take 17 g by mouth daily. Mix in 8 oz liquid and drink  . polyvinyl alcohol (ARTIFICIAL TEARS) 1.4 % ophthalmic solution Place 1 drop into both eyes 2 (two) times daily.   . potassium chloride (K-DUR,KLOR-CON) 10 MEQ tablet Take 3 tablets (30 mEq total) by mouth 2 (two) times daily.  . predniSONE (DELTASONE) 5 MG tablet Take 1 tablet (5 mg total) by mouth daily with breakfast.  . roflumilast (DALIRESP) 500 MCG TABS tablet Take 500 mcg by mouth daily.  Marland Kitchen SPIRIVA HANDIHALER 18 MCG inhalation capsule INHALE THE CONTENTS OF 1 CAPSULE VIA HANDIHALER BY MOUTH EVERY MORNING  . SYMBICORT 160-4.5 MCG/ACT inhaler INHALE 2 PUFFS BY MOUTH TWICE DAILY  . verapamil (VERELAN PM) 360 MG 24 hr capsule Take 1 capsule (360 mg total) daily by mouth.  Alveda Reasons 20 MG TABS tablet Take 20 mg by mouth daily with supper.  . sodium chloride HYPERTONIC 3 % nebulizer solution Take by nebulization 2 (two) times daily. (Patient not taking: Reported on 07/13/2017)  . tobramycin, PF, (TOBI) 300 MG/5ML nebulizer solution Take 5 mLs (300 mg total) by nebulization 2 (two) times daily. Every other month DX: J44.9 (Patient not taking: Reported on 07/13/2017)   No facility-administered encounter medications on file as of 07/13/2017.      Review of Systems  Constitutional:   No  weight loss, night sweats,  Fevers, chills,  +fatigue, or  lassitude.  HEENT:   No headaches,  Difficulty swallowing,  Tooth/dental problems, or  Sore throat,                No sneezing, itching, ear ache,  nasal congestion, post nasal drip,   CV:  No chest pain,  Orthopnea, PND, swelling in lower extremities, anasarca, dizziness, palpitations, syncope.   GI  No heartburn, indigestion, abdominal pain, nausea, vomiting, diarrhea, change in bowel habits, loss of appetite, bloody stools.   Resp: .  No chest wall deformity  Skin: no rash or lesions.  GU: no dysuria, change in color of urine, no urgency or frequency.  No flank pain, no hematuria   MS:  No joint pain or swelling.  No decreased range of motion.  No back pain.    Physical Exam  BP 118/70 (BP Location: Left Arm, Cuff Size: Normal)   Pulse 100   Ht _0  (1.651 m)   Wt 210 lb (95.3 kg)   SpO2 93%   BMI 34.95 kg/m   GEN: A/Ox3; pleasant , NAD, elderly on oxygen, walker   HEENT:  [place]/AT,  EACs-clear, TMs-wnl, NOSE-clear, THROAT-clear, no lesions, no postnasal drip or exudate noted.   NECK:  Supple w/ fair ROM; no JVD; normal carotid impulses w/o bruits; no thyromegaly or nodules palpated; no lymphadenopathy.    RESP bibasilar crackles ,  no accessory muscle use, no dullness to percussion  CARD:  RRR, no m/r/g, tr  peripheral edema, pulses intact, no cyanosis or clubbing.  GI:   Soft & nt; nml bowel sounds; no organomegaly or masses detected.   Musco: Warm bil, no deformities or joint swelling noted.   Neuro: alert, no focal deficits noted.    Skin: Warm, no lesions or rashes    Lab Results:  CBC   BNP   Imaging: No results found.   Assessment & Plan:   Chronic respiratory failure (HCC) Continue on oxygen 5 L at rest and 8 L with activity  Chronic diastolic heart failure of unknown etiology ([place]) Appears compensated without evidence of fluid overload on exam. Continue on current regimen  COPD (chronic obstructive pulmonary disease) (HCC) COPD with frequent exacerbations complicated by chronic bronchitis and Pseudomonas infections.  Patient does appear that she is clinically improved on low-dose  steroids along with her maximum regimen.  Inhaled tobramycin is pending.  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  Plan  Patient Instructions  Continue on Spiriva daily  Continue on Symbicort Twice daily   Continue azithromycin daily Continue using albuterol or Xopenex as needed for shortness of breath Continue on prednisone 55m daily .  Will check to see if insurance will cover inhaled tobramycin every other month Continue 5 L of oxygen continuously, 8L with exertion Follow up in 2 months with Dr. MLake Bells And As needed   Please contact office for sooner follow up if symptoms do not improve or worsen or seek emergency care       Pulmonary [place] ([place] Continue on oxygen  ILD (interstitial lung disease) [place] Continue on current regimen     TRexene Edison NP 07/13/2017

## 2017-07-13 NOTE — Patient Instructions (Signed)
Continue on Spiriva daily  Continue on Symbicort Twice daily   Continue azithromycin daily Continue using albuterol or Xopenex as needed for shortness of breath Continue on prednisone 5mg  daily .  Will check to see if insurance will cover inhaled tobramycin every other month Continue 5 L of oxygen continuously, 8L with exertion Follow up in 2 months with Dr. [place] Bells  And As needed   Please contact office for sooner follow up if symptoms do not improve or worsen or seek emergency care

## 2017-07-13 NOTE — Assessment & Plan Note (Signed)
Continue on oxygen 

## 2017-07-13 NOTE — Assessment & Plan Note (Signed)
COPD with frequent exacerbations complicated by chronic bronchitis and Pseudomonas infections.  Patient does appear that she is clinically improved on low-dose steroids along with her maximum regimen.  Inhaled tobramycin is pending.  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  Plan  Patient Instructions  Continue on Spiriva daily  Continue on Symbicort Twice daily   Continue azithromycin daily Continue using albuterol or Xopenex as needed for shortness of breath Continue on prednisone 5mg  daily .  Will check to see if insurance will cover inhaled tobramycin every other month Continue 5 L of oxygen continuously, 8L with exertion Follow up in 2 months with Dr. [place] Bells  And As needed   Please contact office for sooner follow up if symptoms do not improve or worsen or seek emergency care

## 2017-07-13 NOTE — Assessment & Plan Note (Signed)
Continue on current regimen .   

## 2017-07-13 NOTE — Assessment & Plan Note (Signed)
Appears compensated without evidence of fluid overload on exam. Continue on current regimen

## 2017-07-13 NOTE — Assessment & Plan Note (Signed)
Continue on oxygen 5 L at rest and 8 L with activity

## 2017-07-15 NOTE — Progress Notes (Signed)
Reviewed, agree 

## 2017-07-21 DIAGNOSIS — Z1231 Encounter for screening mammogram for malignant neoplasm of breast: Secondary | ICD-10-CM | POA: Diagnosis not present

## 2017-07-28 ENCOUNTER — Other Ambulatory Visit (HOSPITAL_COMMUNITY): Payer: Self-pay | Admitting: Pharmacist

## 2017-07-28 MED ORDER — POTASSIUM CHLORIDE CRYS ER 10 MEQ PO TBCR: 30.0000 meq | EXTENDED_RELEASE_TABLET | Freq: Two times a day (BID) | ORAL | 5 refills | Status: DC

## 2017-08-14 ENCOUNTER — Other Ambulatory Visit (HOSPITAL_COMMUNITY): Payer: Self-pay | Admitting: *Deleted

## 2017-08-14 MED ORDER — VERAPAMIL HCL ER 360 MG PO CP24: 360.0000 mg | ORAL_CAPSULE | Freq: Every day | ORAL | 3 refills | Status: DC

## 2017-08-15 ENCOUNTER — Other Ambulatory Visit: Payer: Self-pay

## 2017-08-15 ENCOUNTER — Emergency Department (HOSPITAL_COMMUNITY)
Admission: EM | Admit: 2017-08-15 | Discharge: 2017-08-15 | Disposition: A | Discharge status: Home / Self Care | Payer: Medicare Other | Attending: Emergency Medicine | Admitting: Emergency Medicine

## 2017-08-15 ENCOUNTER — Encounter (HOSPITAL_COMMUNITY): Payer: Self-pay | Admitting: Emergency Medicine

## 2017-08-15 DIAGNOSIS — R040 Epistaxis: Secondary | ICD-10-CM | POA: Diagnosis not present

## 2017-08-15 DIAGNOSIS — E1140 Type 2 diabetes mellitus with diabetic neuropathy, unspecified: Secondary | ICD-10-CM | POA: Diagnosis not present

## 2017-08-15 DIAGNOSIS — J449 Chronic obstructive pulmonary disease, unspecified: Secondary | ICD-10-CM | POA: Insufficient documentation

## 2017-08-15 DIAGNOSIS — I110 Hypertensive heart disease with heart failure: Secondary | ICD-10-CM | POA: Diagnosis not present

## 2017-08-15 DIAGNOSIS — I480 Paroxysmal atrial fibrillation: Secondary | ICD-10-CM | POA: Diagnosis not present

## 2017-08-15 DIAGNOSIS — Z79899 Other long term (current) drug therapy: Secondary | ICD-10-CM | POA: Diagnosis not present

## 2017-08-15 DIAGNOSIS — Z87891 Personal history of nicotine dependence: Secondary | ICD-10-CM | POA: Diagnosis not present

## 2017-08-15 DIAGNOSIS — Z7901 Long term (current) use of anticoagulants: Secondary | ICD-10-CM | POA: Diagnosis not present

## 2017-08-15 DIAGNOSIS — E785 Hyperlipidemia, unspecified: Secondary | ICD-10-CM | POA: Diagnosis not present

## 2017-08-15 DIAGNOSIS — Z794 Long term (current) use of insulin: Secondary | ICD-10-CM | POA: Insufficient documentation

## 2017-08-15 DIAGNOSIS — I5032 Chronic diastolic (congestive) heart failure: Secondary | ICD-10-CM | POA: Diagnosis not present

## 2017-08-15 DIAGNOSIS — R0602 Shortness of breath: Secondary | ICD-10-CM | POA: Diagnosis not present

## 2017-08-15 MED ORDER — ALBUTEROL SULFATE (2.5 MG/3ML) 0.083% IN NEBU
5.0000 mg | INHALATION_SOLUTION | Freq: Once | RESPIRATORY_TRACT | Status: AC
  Administered 2017-08-15: 5 mg via RESPIRATORY_TRACT
  Filled 2017-08-15: qty 6

## 2017-08-15 MED ORDER — PREDNISONE 20 MG PO TABS
60.0000 mg | ORAL_TABLET | Freq: Once | ORAL | Status: AC
  Administered 2017-08-15: 60 mg via ORAL
  Filled 2017-08-15: qty 3

## 2017-08-15 MED ORDER — OXYMETAZOLINE HCL 0.05 % NA SOLN
1.0000 | Freq: Once | NASAL | Status: AC
  Administered 2017-08-15: 1 via NASAL
  Filled 2017-08-15: qty 15

## 2017-08-15 NOTE — ED Notes (Signed)
Patient verbalizes understanding of discharge instructions. Opportunity for questioning and answers were provided. Armband removed by staff, pt discharged from ED via wheelchair.  

## 2017-08-15 NOTE — ED Triage Notes (Signed)
Per [facility]: Patient to ED from home c/o nosebleed x 3 hours. Patient on Xarelto, states she had a slip and fall this morning, but doesn't know if the nosebleed is secondary to that or not, as she has a hx of epistaxis with need for blood transfusion. Bleeding currently controlled with minimal oozing. Airway intact, patient speaking in clear, full sentences. Self-administered Afrin prior to EMS arrival - EMS was on scene for 20 minutes trying to control bleeding. EMS VS: HR 112, 112/60, 95% on 8L O2 [place] (pt on 5L O2 [place] at rest all the time, 8L with exertion), CBG 185. A&O x 4. Denies dizziness.

## 2017-08-15 NOTE — Discharge Instructions (Signed)
Wear oxygen in right side of nose until packing is removed. Call your Dr. To make appointment to remove packing in 5 days.

## 2017-08-15 NOTE — ED Provider Notes (Signed)
[facility] DEPART[facility] Provider Note   CSN: [id number] Arrival date & time: 08/15/17  1313     History   Chief Complaint Chief Complaint  Patient presents with  . Epistaxis    HPI Destiny Shelton is a 72 y.o. female.  Complaint is nosebleed  HPI: 72 year old female.  History of COPD.  On 6 L nasal cannula "all the time".  On Eliquis PAF.  Started having nosebleeds yesterday.  Awakened this morning with blood.  Persistent bleeding through the day, getting worse.  Presents here.  Past Medical History:  Diagnosis Date  . Adenomatous colon polyp   . ALLERGIC RHINITIS   . Anemia    iron deficient  . Anxiety   . Atrial tachycardia (HCC)    Mostly Sinus Tachycardia  . Back pain, chronic   . Carotid stenosis   . CHF (congestive heart failure) ([place])   . Community acquired pneumonia - Recent Admission (07/2013) 07/08/2013  . COPD (chronic obstructive pulmonary disease) ([place])    Requiring Home O2 at 4L  . Depression   . Diabetes mellitus type 2 with neurological manifestations ([place])    On Insulin  . Diverticulosis of colon   . External hemorrhoids   . Fibromyalgia    "pain in arms and shoulders."  . Gastritis   . GERD (gastroesophageal reflux disease)   . Headache(784.0)    tension  . Hyperlipemia   . Hypertension   . Inappropriate sinus tachycardia   . Morbidly obese ([place])   . Nephrolithiasis   . Neuromuscular disorder ([place])    diabetic neuropathy  . Osteoarthritis   . Osteoporosis   . PAF (paroxysmal atrial fibrillation) ([place])   . 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  . Sleep apnea    no cpap machine  02 at 4l/min all the time  . Urosepsis 05/26/2012    Patient Active Problem List   Diagnosis Date Noted  . Epistaxis 03/02/2017  . Enterococcal bacteremia 01/30/2017  . CAP (community acquired pneumonia) 01/29/2017  . Sepsis ([place]) 01/29/2017  . Bleeding from the nose 01/29/2017  . ILD (interstitial lung disease) ([place]) 06/26/2016  . Fall   . AKI  (acute kidney injury) ([place]) 05/25/2016  . Acute on chronic respiratory failure with hypoxia ([place]) 05/25/2016  . Hyponatremia 05/25/2016  . Metabolic acidosis 25/00/3704  . Acute hip pain, left 05/25/2016  . Acute renal failure (ARF) ([place]) 05/25/2016  . COPD (chronic obstructive pulmonary disease) ([place]) 05/25/2016  . COPD exacerbation ([place]) 01/22/2016  . Biomechanical lesion of rib cage 01/22/2016  . Anemia 01/22/2016  . Chronic diastolic CHF (congestive heart failure) ([place]) 12/12/2015  . Morbid obesity ([place]) 12/29/2014  . Benign neoplasm of sigmoid colon   . Chronic diastolic heart failure of unknown etiology ([place]) 08/17/2013  . Chronic respiratory failure ([place]) 04/21/2013  . Polyposis coli-attenuated 01/24/2013  . Hypokalemia 10/02/2012  . Sigmoid tubular adenoma with high-grade dysplasia 10/01/2012  . Gastric polyp 10/01/2012  . Schatzki's ring 10/01/2012  . PAF (paroxysmal atrial fibrillation) ([place]) 05/29/2012  . Pulmonary hypertension ([place]) 04/15/2012  . Inappropriate sinus tachycardia   . Obesity, Class II, BMI 35-39.9, with comorbidity (HCC)     Class: Chronic  . History of failed moderate sedation 06/10/2011  . Pulmonary nodules 09/11/2010  . Centrilobular emphysema ([place]) 09/11/2010  . CHRONIC DYSPNEA 07/26/2009  . Diabetes mellitus, type II, insulin dependent ([place]) 07/29/2007    Class: Diagnosis of  . DIABETIC PERIPHERAL NEUROPATHY 07/29/2007  . Anxiety state 07/29/2007  . Depression  07/29/2007  . Essential hypertension 07/29/2007  . NEPHROLITHIASIS 07/29/2007  . Osteoarthritis 07/29/2007  . Backache 07/29/2007  . Osteoporosis 07/29/2007  . Personal history of colonic polyps 04/28/2007    Past Surgical History:  Procedure Laterality Date  . ABDOMINAL HYSTERECTOMY  1978  . APPENDECTOMY  1963  . CARDIAC CATHETERIZATION N/A 07/04/2015   Procedure: Right Heart Cath;  Surgeon: Larey Dresser, MD;  Location: [facility];  Service: Cardiovascular;  Laterality:  N/A;  . CHOLECYSTECTOMY  1963  . COLONOSCOPY N/A 12/21/2012   Procedure: COLONOSCOPY;  Surgeon: Gatha Mayer, MD;  Location: [facility];  Service: Endoscopy;  Laterality: N/A;  . COLONOSCOPY N/A 11/17/2013   Procedure: COLONOSCOPY;  Surgeon: Gatha Mayer, MD;  Location: [facility];  Service: Endoscopy;  Laterality: N/A;  . COLONOSCOPY W/ BIOPSIES AND POLYPECTOMY  07/22/2007, 04/28/2007   2009: diverticulosis, 2008: diverticulosis, adenomatous polyps  . COLONOSCOPY WITH PROPOFOL N/A 10/01/2012   Procedure: COLONOSCOPY WITH PROPOFOL;  Surgeon: Gatha Mayer, MD;  Location: [facility];  Service: Endoscopy;  Laterality: N/A;  . COLONOSCOPY WITH PROPOFOL N/A 10/10/2014   Procedure: COLONOSCOPY WITH PROPOFOL;  Surgeon: Gatha Mayer, MD;  Location: [facility];  Service: Endoscopy;  Laterality: N/A;  . ESOPHAGOGASTRODUODENOSCOPY (EGD) WITH PROPOFOL N/A 10/01/2012   Procedure: ESOPHAGOGASTRODUODENOSCOPY (EGD) WITH PROPOFOL;  Surgeon: Gatha Mayer, MD;  Location: [facility];  Service: Endoscopy;  Laterality: N/A;  . HOT HEMOSTASIS N/A 12/21/2012   Procedure: HOT HEMOSTASIS (ARGON PLASMA COAGULATION/BICAP);  Surgeon: Gatha Mayer, MD;  Location: [facility];  Service: Endoscopy;  Laterality: N/A;  . HOT HEMOSTASIS N/A 10/10/2014   Procedure: HOT HEMOSTASIS (ARGON PLASMA COAGULATION/BICAP);  Surgeon: Gatha Mayer, MD;  Location: [facility];  Service: Endoscopy;  Laterality: N/A;  . LEFT AND RIGHT HEART CATHETERIZATION WITH CORONARY ANGIOGRAM  12/19/2011   Procedure: LEFT AND RIGHT HEART CATHETERIZATION WITH CORONARY ANGIOGRAM;  Surgeon: Leonie Man, MD;  Location[facility] C[facility];  Service: Cardiovascular;;  . RIGHT AND LEFT CARDIAC CATHETERIZATION  July 2013   RAP: 22 mmHg, and RVP: 46/16 mmHg, RV EDP 18; L. BP 110/11 mmHg, LVEDP 20 mmHg;  PAP 45/29 mmHg (mean 37 mmHg); PCWP 29/22 mmHg - 26 mmHg; no significant coronary disease  . TEE WITHOUT CARDIOVERSION  03/24/2012   Procedure:  TRANSESOPHAGEAL ECHOCARDIOGRAM (TEE);  Surgeon: Pixie Casino, MD;  Location: [facility] OR;  Service: Cardiovascular;  Laterality: N/A;  TEE with [place]  . TOTAL KNEE ARTHROPLASTY  1997   right  . UPPER GASTROINTESTINAL ENDOSCOPY  04/09/2006   w/Dilation, gastritis  . VIDEO BRONCHOSCOPY Bilateral 07/22/2016   Procedure: VIDEO BRONCHOSCOPY WITH FLUORO;  Surgeon: Juanito Doom, MD;  Location: [facility];  Service: Cardiopulmonary;  Laterality: Bilateral;    OB History    No data available       Home Medications    Prior to Admission medications   Medication Sig Start Date End Date Taking? Authorizing Provider  acetaminophen (TYLENOL) 500 MG tablet Take 1 tablet (500 mg total) by mouth every 6 (six) hours as needed for mild pain or headache. 02/01/17   Arrien, Jimmy Picket, MD  albuterol (PROVENTIL) (2.5 MG/3ML) 0.083% nebulizer solution Take 3 mLs (2.5 mg total) by nebulization every 6 (six) hours as needed for wheezing or shortness of breath. 06/09/16   Magdalen Spatz, NP  ALPRAZolam Duanne Moron) 0.5 MG tablet Take 0.25-0.5 mg by mouth daily as needed for anxiety.  02/06/17   [provider]  atorvastatin (  LIPITOR) 40 MG tablet Take 40 mg by mouth at bedtime.  06/06/13   [provider]  azithromycin (ZITHROMAX) 250 MG tablet TAKE 1 TABLET BY MOUTH DAILY. START AFTER Laqueta Linden 06/09/17   Juanito Doom, MD  benzonatate (TESSALON) 200 MG capsule Take 400 mg by mouth at bedtime.     [provider]  budesonide-formoterol (SYMBICORT) 160-4.5 MCG/ACT inhaler Inhale 2 puffs into the lungs 2 (two) times daily. 03/05/17   Magdalen Spatz, NP  buPROPion (WELLBUTRIN XL) 300 MG 24 hr tablet Take 300 mg by mouth daily. 03/03/16   [provider]  DULoxetine (CYMBALTA) 60 MG capsule Take 60 mg by mouth daily.     [provider]  furosemide (LASIX) 40 MG tablet Take 2 tablets (80 mg total) by mouth 2 (two) times daily. 04/27/17   Larey Dresser, MD    gabapentin (NEURONTIN) 600 MG tablet Take 600 mg by mouth 3 (three) times daily.     [provider]  Guaifenesin (MUCINEX MAXIMUM STRENGTH) 1200 MG TB12 Take 1,200 mg by mouth 2 (two) times daily as needed (for cough or congestion).     [provider]  HYDROcodone-acetaminophen (NORCO/VICODIN) 5-325 MG per tablet Take 1 tablet by mouth See admin instructions. EVERY 4-6 HOURS AS NEEDED FOR PAIN OR SHORTNESS OF BREATH    [provider]  insulin aspart (NOVOLOG FLEXPEN) 100 UNIT/ML FlexPen Inject 5 Units into the skin 3 (three) times daily as needed for high blood sugar.    [provider]  insulin glargine (LANTUS) 100 UNIT/ML injection Inject 0.66 mLs (66 Units total) into the skin daily before breakfast. Patient taking differently: Inject 64 Units into the skin daily before breakfast.  01/24/16   Janece Canterbury, MD  irbesartan (AVAPRO) 150 MG tablet Take 1 tablet (150 mg total) by mouth daily. 12/31/16   Larey Dresser, MD  iron polysaccharides (NIFEREX) 150 MG capsule Take 1 capsule (150 mg total) by mouth 2 (two) times daily. Patient taking differently: Take 150 mg by mouth daily.  01/24/16   Janece Canterbury, MD  levalbuterol [facility] HFA) 45 MCG/ACT inhaler Inhale 1-2 puffs into the lungs every 4 (four) hours as needed for wheezing. Patient taking differently: Inhale 2 puffs into the lungs every 4 (four) hours as needed for wheezing or shortness of breath.  06/01/12   Marton Redwood, MD  metolazone (ZAROXOLYN) 5 MG tablet Take 5 mg by mouth 2 (two) times a week. Wednesday and Friday    [provider]  Multiple Vitamin (MULTIVITAMIN WITH MINERALS) TABS tablet Take 1 tablet by mouth daily.    [provider]  omeprazole (PRILOSEC OTC) 20 MG tablet Take 20 mg by mouth at bedtime.    [provider]  OXYGEN Inhale 5-8 L into the lungs See admin instructions. 5 liters when at rest and 8 liters when ambulatory    [provider]   polyethylene glycol (MIRALAX / GLYCOLAX) packet Take 17 g by mouth daily. Mix in 8 oz liquid and drink    [provider]  polyvinyl alcohol (ARTIFICIAL TEARS) 1.4 % ophthalmic solution Place 1 drop into both eyes 2 (two) times daily.     [provider]  potassium chloride (K-DUR,KLOR-CON) 10 MEQ tablet Take 3 tablets (30 mEq total) by mouth 2 (two) times daily. 07/28/17   Larey Dresser, MD  predniSONE (DELTASONE) 5 MG tablet Take 1 tablet (5 mg total) by mouth daily with breakfast. 06/15/17  Juanito Doom, MD  roflumilast (DALIRESP) 500 MCG TABS tablet Take 500 mcg by mouth daily.    [provider]  sodium chloride HYPERTONIC 3 % nebulizer solution Take by nebulization 2 (two) times daily. Patient not taking: Reported on 07/13/2017 11/25/16   Juanito Doom, MD  SPIRIVA HANDIHALER 18 MCG inhalation capsule INHALE THE CONTENTS OF 1 CAPSULE VIA HANDIHALER BY MOUTH EVERY MORNING 11/03/16   Juanito Doom, MD  SYMBICORT 160-4.5 MCG/ACT inhaler INHALE 2 PUFFS BY MOUTH TWICE DAILY 07/13/17   Juanito Doom, MD  tobramycin, PF, (TOBI) 300 MG/5ML nebulizer solution Take 5 mLs (300 mg total) by nebulization 2 (two) times daily. Every other month DX: J44.9 Patient not taking: Reported on 07/13/2017 06/19/17   Juanito Doom, MD  verapamil (VERELAN PM) 360 MG 24 hr capsule Take 1 capsule (360 mg total) by mouth daily. 08/14/17   Larey Dresser, MD  XARELTO 20 MG TABS tablet Take 20 mg by mouth daily with supper. 02/22/17   [provider]    Family History Family History  Problem Relation Age of Onset  . Heart disease Mother   . Stroke Mother   . Heart disease Father   . Heart disease Brother   . Stroke Brother   . Skin cancer Brother   . Colon polyps Sister   . Diabetes Sister   . Diabetes Brother   . Irritable bowel syndrome Daughter   . Colon cancer Neg Hx     Social History Social History   Tobacco Use  . Smoking status: Former  Smoker    Packs/day: 2.00    Years: 30.00    Pack years: 60.00    Types: Cigarettes    Last attempt to quit: 06/02/1994    Years since quitting: 23.2  . Smokeless tobacco: Never Used  Substance Use Topics  . Alcohol use: No  . Drug use: No     Allergies   Aspirin and Nsaids   Review of Systems Review of Systems  Constitutional: Negative for appetite change, chills, diaphoresis, fatigue and fever.  HENT: Positive for nosebleeds. Negative for mouth sores, sore throat and trouble swallowing.   Eyes: Negative for visual disturbance.  Respiratory: Positive for shortness of breath and wheezing. Negative for cough.   Cardiovascular: Negative for chest pain.  Gastrointestinal: Negative for abdominal distention, abdominal pain, diarrhea, nausea and vomiting.  Endocrine: Negative for polydipsia, polyphagia and polyuria.  Genitourinary: Negative for dysuria, frequency and hematuria.  Musculoskeletal: Negative for gait problem.  Skin: Negative for color change, pallor and rash.  Neurological: Negative for dizziness, syncope, light-headedness and headaches.  Hematological: Does not bruise/bleed easily.  Psychiatric/Behavioral: Negative for behavioral problems and confusion.     Physical Exam Updated Vital Signs SpO2 95%   Physical Exam  Constitutional: She is oriented to person, place, and time. She appears well-developed and well-nourished. No distress.  HENT:  Pulsatile bleeder noted on left anterior septum.  Eyes: Conjunctivae are normal. Pupils are equal, round, and reactive to light. No scleral icterus.  Neck: Normal range of motion. Neck supple. No thyromegaly present.  Cardiovascular: Normal rate and regular rhythm. Exam reveals no gallop and no friction rub.  No murmur heard. Pulmonary/Chest: Effort normal and breath sounds normal. No respiratory distress. She has no wheezes. She has no rales.  Mild wheezing and prolongation.  Abdominal: Soft. Bowel sounds are normal. She  exhibits no distension. There is no tenderness. There is no rebound.  Musculoskeletal: Normal range  of motion.  Neurological: She is alert and oriented to person, place, and time.  Skin: Skin is warm and dry. No rash noted.  Psychiatric: She has a normal mood and affect. Her behavior is normal.     ED Treatments / Results  Labs (all labs ordered are listed, but only abnormal results are displayed) Labs Reviewed - No data to display  EKG  EKG Interpretation None       Radiology No results found.  Procedures Procedures (including critical care time)  Medications Ordered in ED Medications  oxymetazoline (AFRIN) 0.05 % nasal spray 1 spray (1 spray Each Nare Given by Other 08/15/17 1348)  albuterol (PROVENTIL) (2.5 MG/3ML) 0.083% nebulizer solution 5 mg (5 mg Nebulization Given 08/15/17 1357)  predniSONE (DELTASONE) tablet 60 mg (60 mg Oral Given 08/15/17 1357)     Initial Impression / Assessment and Plan / ED Course  I have reviewed the triage vital signs and the nursing notes.  Pertinent labs & imaging results that were available during my care of the patient were reviewed by me and considered in my medical decision making (see chart for details).    Procedure: Epistaxis treatment.  Cotton instilled with Neo-Synephrine packed in the left anterior naris for 30 minutes.  On recheck there is no residual bleeding but there is pulsation of the nonbleeding vessel on the exposed left anterior septum in [place]'s area.  2 side-by-side Merocel sponges were placed and infiltrated with Neo-Synephrine with good residual hemostasis.  No blood noted in the posterior pharynx.    Patient given nebulized albuterol.  Feeling better.  Offered additional nebs that she has continued wheezing but no hypoxemia.  Politely declines.  Strongly desires to be discharged home.  Of asked her to follow-up with her physician for packing removal in 4-5 days.  ER with worsening or rebleeding  Final Clinical  Impressions(s) / ED Diagnoses   Final diagnoses:  Epistaxis    ED Discharge Orders    None       Tanna Furry, MD 08/15/17 [phone]

## 2017-08-15 NOTE — ED Notes (Signed)
MD at bedside. 

## 2017-08-17 DIAGNOSIS — R300 Dysuria: Secondary | ICD-10-CM | POA: Diagnosis not present

## 2017-08-18 DIAGNOSIS — R8290 Unspecified abnormal findings in urine: Secondary | ICD-10-CM | POA: Diagnosis not present

## 2017-08-18 DIAGNOSIS — N390 Urinary tract infection, site not specified: Secondary | ICD-10-CM | POA: Diagnosis not present

## 2017-08-19 DIAGNOSIS — R040 Epistaxis: Secondary | ICD-10-CM | POA: Diagnosis not present

## 2017-08-26 DIAGNOSIS — M810 Age-related osteoporosis without current pathological fracture: Secondary | ICD-10-CM | POA: Diagnosis not present

## 2017-09-01 DIAGNOSIS — M810 Age-related osteoporosis without current pathological fracture: Secondary | ICD-10-CM | POA: Diagnosis not present

## 2017-09-01 DIAGNOSIS — E1149 Type 2 diabetes mellitus with other diabetic neurological complication: Secondary | ICD-10-CM | POA: Diagnosis not present

## 2017-09-01 DIAGNOSIS — E7849 Other hyperlipidemia: Secondary | ICD-10-CM | POA: Diagnosis not present

## 2017-09-07 ENCOUNTER — Other Ambulatory Visit: Payer: Self-pay

## 2017-09-07 ENCOUNTER — Ambulatory Visit (HOSPITAL_COMMUNITY)
Admission: RE | Admit: 2017-09-07 | Discharge: 2017-09-07 | Disposition: A | Discharge status: Home / Self Care | Payer: Medicare Other | Source: Ambulatory Visit | Attending: Cardiology | Admitting: Cardiology

## 2017-09-07 ENCOUNTER — Encounter (HOSPITAL_COMMUNITY): Payer: Self-pay | Admitting: Cardiology

## 2017-09-07 VITALS — BP 111/54 | HR 122 | Wt 210.5 lb

## 2017-09-07 DIAGNOSIS — Z808 Family history of malignant neoplasm of other organs or systems: Secondary | ICD-10-CM | POA: Insufficient documentation

## 2017-09-07 DIAGNOSIS — Z823 Family history of stroke: Secondary | ICD-10-CM | POA: Insufficient documentation

## 2017-09-07 DIAGNOSIS — Z9981 Dependence on supplemental oxygen: Secondary | ICD-10-CM | POA: Diagnosis not present

## 2017-09-07 DIAGNOSIS — Z794 Long term (current) use of insulin: Secondary | ICD-10-CM | POA: Diagnosis not present

## 2017-09-07 DIAGNOSIS — I272 Pulmonary hypertension, unspecified: Secondary | ICD-10-CM | POA: Insufficient documentation

## 2017-09-07 DIAGNOSIS — Z7952 Long term (current) use of systemic steroids: Secondary | ICD-10-CM | POA: Diagnosis not present

## 2017-09-07 DIAGNOSIS — I480 Paroxysmal atrial fibrillation: Secondary | ICD-10-CM | POA: Diagnosis not present

## 2017-09-07 DIAGNOSIS — Z7901 Long term (current) use of anticoagulants: Secondary | ICD-10-CM | POA: Insufficient documentation

## 2017-09-07 DIAGNOSIS — G4733 Obstructive sleep apnea (adult) (pediatric): Secondary | ICD-10-CM | POA: Insufficient documentation

## 2017-09-07 DIAGNOSIS — I5032 Chronic diastolic (congestive) heart failure: Secondary | ICD-10-CM | POA: Diagnosis not present

## 2017-09-07 DIAGNOSIS — J449 Chronic obstructive pulmonary disease, unspecified: Secondary | ICD-10-CM | POA: Diagnosis not present

## 2017-09-07 DIAGNOSIS — E119 Type 2 diabetes mellitus without complications: Secondary | ICD-10-CM | POA: Insufficient documentation

## 2017-09-07 DIAGNOSIS — Z833 Family history of diabetes mellitus: Secondary | ICD-10-CM | POA: Diagnosis not present

## 2017-09-07 DIAGNOSIS — J849 Interstitial pulmonary disease, unspecified: Secondary | ICD-10-CM | POA: Diagnosis not present

## 2017-09-07 DIAGNOSIS — Z79899 Other long term (current) drug therapy: Secondary | ICD-10-CM | POA: Diagnosis not present

## 2017-09-07 DIAGNOSIS — I110 Hypertensive heart disease with heart failure: Secondary | ICD-10-CM | POA: Insufficient documentation

## 2017-09-07 LAB — CBC
HCT: 35.7 % — ABNORMAL LOW (ref 36.0–46.0)
Hemoglobin: 10.6 g/dL — ABNORMAL LOW (ref 12.0–15.0)
MCH: 27.2 pg (ref 26.0–34.0)
MCHC: 29.7 g/dL — ABNORMAL LOW (ref 30.0–36.0)
MCV: 91.8 fL (ref 78.0–100.0)
PLATELETS: 311 10*3/uL (ref 150–400)
RBC: 3.89 MIL/uL (ref 3.87–5.11)
RDW: 15.2 % (ref 11.5–15.5)
WBC: 9.7 10*3/uL (ref 4.0–10.5)

## 2017-09-07 LAB — BASIC METABOLIC PANEL
ANION GAP: 11 (ref 5–15)
BUN: 11 mg/dL (ref 6–20)
CO2: 27 mmol/L (ref 22–32)
CREATININE: 0.92 mg/dL (ref 0.44–1.00)
Calcium: 8.6 mg/dL — ABNORMAL LOW (ref 8.9–10.3)
Chloride: 102 mmol/L (ref 101–111)
GFR calc non Af Amer: >60 mL/min (ref >60)
Glucose, Bld: 148 mg/dL — ABNORMAL HIGH (ref 65–99)
POTASSIUM: 4.2 mmol/L (ref 3.5–5.1)
SODIUM: 140 mmol/L (ref 135–145)

## 2017-09-07 NOTE — Patient Instructions (Signed)
Labs today (will call for abnormal results, otherwise no news is good news)  Follow up with Dr. Aundra Dubin in 4 Months.  Please call our office in June to schedule follow up appointment.  Please call [phone], Option 3.

## 2017-09-08 NOTE — Progress Notes (Signed)
Patient ID: Destiny Shelton, female   DOB: 1946/04/11, 72 y.o.   MRN: [id number]  PCP: Dr. Brigitte Pulse HF Cardiology: Dr. Aundra Dubin  72 yo with history of paroxysmal atrial fibrillation, chronic diastolic CHF with restrictive hemodynamics on 2013 RHC, COPD on home oxygen, inappropriate sinus tachycardia, and concern for intracardiac shunting presents for followup of diastolic CHF.  She has been followed for several years by both cardiology and pulmonology.  She is a prior smoker and has been diagnosed with COPD.  Interestingly, her PFTs in 10/16 showed improvement, suggesting mild obstructive airways disease.  However, she still requires home oxygen.  She had transcranial dopplers in 2013 suggesting a medium-sized right to left shunt.  This led to an extensive workup.  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  Cardiac MRI was unremarkable, no evidence for shunt lesion.  RHC/LHC showed no coronary disease but it did show restrictive hemodynamics and volume overload.  Qp/Qs from this study was 1.27/1, suggesting a relatively small left to right shunt.  A definite shunt lesion was never identified.    Echo in 2/17 showed normal LV size and systolic function and normal RV.  I also did a RHC in 2/17: on higher dose diuretics, she had mild pulmonary hypertension and normal PCWP.  There was no evidence for a shunt lesion with Qp/Qs 0.97.   She was admitted in 9/17 with acute exacerbation of COPD.  She was treated with steroids and nebs.  She was admitted in 12/17 with suspected COPD exacerbation, treated with steroids and levofloxacin.  Lasix was decreased to 80 mg once daily but I increased it back to 80 qam/40 qpm when I saw her in followup, later up to Lasix 80 mg bid.  Subsequently, metolazone was started twice a week.   In 8/18, she was admitted with PNA and severe epistaxis.  She was in the ER again with epistaxis in 10/18.  She subsequently saw an ENT with cautery done in her nose.  She has had  only 1 episode of epistaxis since then and is on Xarelto still.    She remains short of breath walking around the house, no change.  Weight is up about 4 lbs. She walks with a walker.  No chest pain.  No lightheadedness.  She has slept in a recliner long-term.  No BRBPR/melena.  She is now on prednisone 5 mg daily long-term.  She has started inhaled tobramycin with chronic Pseudomonas.  She is using oxygen 5L at rest, 8L with exertion.   Labs (9/16): digoxin 0.6, K 4.3, creatinine 0.85 Labs (1/17): K 3.9, creatinine 0.96, hgb 8.9 Labs (3/17): K 4.2, creatinine 0.96 Labs (6/17): K 4.2, creatinine 1.03 Labs (8/17): K 4.1, creatinine 0.81, HCT 34.3 Labs (9/17): K 3.7, creatinine 0.91, hgb 12.1 Labs (12/17): K 3.4, creatinine 0.84, hgb 13 Labs (1/18): K 4.2, creatinine 1.27 Labs (6/18): pro-BNP 42 Labs (7/18): K 4.5, creatinine 0.93 Labs (10/18): K 3.8, creatinine 1.38, hgb 8.1 Labs (12/18): K 4.4, creatinine 1.11, hgb 8.4  ECG (personally reviewed): Sinus tachycardia at 114.   PMH: 1. Atrial fibrillation: Paroxysmal.  2. HTN 3. COPD: Home oxygen.  PFTs (10/16) with FVC 77%, FEV1 68%, TLC 89%.  Now severe.  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  4. Type II diabetes 5. Obesity 6. Inappropriate sinus tachycardia 7. OSA: Does not use CPAP, uses oxygen at night.  8. Chronic diastolic CHF: RHC/LHC in 6/83 with no significant CAD, mean RA 19, RV 46/18, PA 45/29 mean  37, mean PCWP 22, CI 4.1, PVR 1.4 WU => restrictive hemodynamics with no evidence for constriction;  Qp/Qs 1.27/1; saturation run difficult to interpret.  TEE (10/13): EF 55-60%, LVH, mild MR, bubble study showed no evidence for immediate R=>L bubble crossing, small amount of bubbles ended up in left heart > 5 seconds after injection suggesting possible intrapulmonary shunting; no shunt by color doppler.  Cardiac MRI (8/13) with EF 59%, no LGE, normal RV size/systolic function => interpreted as normal.  RHC 07/04/2015: RA mean 9, RV 45/10,  PA 41/20 mean 30, PCWP mean 13, no step up on shunt run,cardiac output (Fick) 6.7 cardiac index (Fick) 3.3, PVR 2.5 WU, Qp/Qs = 0.97. Echo (2/17) with EF 55-60%, mild LVH, mild MR, normal RV size and systolic function.  9. ?Intracardiac shunt: Transcranial dopplers in 10/13 were suggestive of medium-sized right to left shunt.  Bubble study on TEE in 10/13 showed no evidence for immediate R=>L bubble crossing, small amount of bubbles ended up in left heart > 5 seconds after injection suggesting possible intrapulmonary shunting; no shunt by color doppler. Cardiac MRI showed no evidence for shunting.  Cardiac cath in 2013 actually was suggestive of a small left to right shunt.  Cardiac cath in 2/17 NOT suggestive of significant shunt, Qp/Qs 0.97.  10. Interstitial lung disease: HRCT in 1/18 was concerning for ILD, possibly scarring from prior respiratory infections.   SH: Married, prior heavy smoker (quit 1996).  No ETOH.   Family History  Problem Relation Age of Onset  . Heart disease Mother   . Stroke Mother   . Heart disease Father   . Heart disease Brother   . Stroke Brother   . Skin cancer Brother   . Colon polyps Sister   . Diabetes Sister   . Diabetes Brother   . Irritable bowel syndrome Daughter   . Colon cancer Neg Hx    ROS: All systems reviewed and negative except as per HPI.   Current Outpatient Medications  Medication Sig Dispense Refill  . acetaminophen (TYLENOL) 500 MG tablet Take 1 tablet (500 mg total) by mouth every 6 (six) hours as needed for mild pain or headache. 30 tablet 0  . albuterol (PROVENTIL) (2.5 MG/3ML) 0.083% nebulizer solution Take 3 mLs (2.5 mg total) by nebulization every 6 (six) hours as needed for wheezing or shortness of breath. 75 mL 3  . ALPRAZolam (XANAX) 0.5 MG tablet Take 0.25-0.5 mg by mouth daily as needed for anxiety.   4  . atorvastatin (LIPITOR) 40 MG tablet Take 40 mg by mouth at bedtime.     Marland Kitchen azithromycin (ZITHROMAX) 250 MG tablet TAKE 1  TABLET BY MOUTH DAILY. START AFTER FINISHING LEVAQUIN 90 tablet 3  . benzonatate (TESSALON) 200 MG capsule Take 400 mg by mouth at bedtime.     . budesonide-formoterol (SYMBICORT) 160-4.5 MCG/ACT inhaler Inhale 2 puffs into the lungs 2 (two) times daily. 2 Inhaler 0  . buPROPion (WELLBUTRIN XL) 300 MG 24 hr tablet Take 300 mg by mouth daily.    . DULoxetine (CYMBALTA) 60 MG capsule Take 60 mg by mouth daily.     . furosemide (LASIX) 40 MG tablet Take 2 tablets (80 mg total) by mouth 2 (two) times daily. 120 tablet 3  . gabapentin (NEURONTIN) 600 MG tablet Take 600 mg by mouth 3 (three) times daily.     . Guaifenesin (MUCINEX MAXIMUM STRENGTH) 1200 MG TB12 Take 1,200 mg by mouth 2 (two) times daily as needed (for cough  or congestion).     Marland Kitchen HYDROcodone-acetaminophen (NORCO/VICODIN) 5-325 MG per tablet Take 1 tablet by mouth See admin instructions. EVERY 4-6 HOURS AS NEEDED FOR PAIN OR SHORTNESS OF BREATH    . insulin aspart (NOVOLOG FLEXPEN) 100 UNIT/ML FlexPen Inject 5 Units into the skin 3 (three) times daily as needed for high blood sugar.    . insulin glargine (LANTUS) 100 UNIT/ML injection Inject 0.66 mLs (66 Units total) into the skin daily before breakfast. (Patient taking differently: Inject 64 Units into the skin daily before breakfast. )    . irbesartan (AVAPRO) 150 MG tablet Take 1 tablet (150 mg total) by mouth daily. 90 tablet 3  . iron polysaccharides (NIFEREX) 150 MG capsule Take 1 capsule (150 mg total) by mouth 2 (two) times daily. (Patient taking differently: Take 150 mg by mouth daily. ) 60 capsule 0  . levalbuterol (XOPENEX HFA) 45 MCG/ACT inhaler Inhale 1-2 puffs into the lungs every 4 (four) hours as needed for wheezing. (Patient taking differently: Inhale 2 puffs into the lungs every 4 (four) hours as needed for wheezing or shortness of breath. ) 1 Inhaler 12  . metolazone (ZAROXOLYN) 5 MG tablet Take 5 mg by mouth 2 (two) times a week. Wednesday and Friday    . Multiple Vitamin  (MULTIVITAMIN WITH MINERALS) TABS tablet Take 1 tablet by mouth daily.    Marland Kitchen omeprazole (PRILOSEC OTC) 20 MG tablet Take 20 mg by mouth at bedtime.    . OXYGEN Inhale 5-8 L into the lungs See admin instructions. 5 liters when at rest and 8 liters when ambulatory    . polyethylene glycol (MIRALAX / GLYCOLAX) packet Take 17 g by mouth daily. Mix in 8 oz liquid and drink    . polyvinyl alcohol (ARTIFICIAL TEARS) 1.4 % ophthalmic solution Place 1 drop into both eyes 2 (two) times daily.     . potassium chloride (K-DUR,KLOR-CON) 10 MEQ tablet Take 3 tablets (30 mEq total) by mouth 2 (two) times daily. 180 tablet 5  . predniSONE (DELTASONE) 5 MG tablet Take 1 tablet (5 mg total) by mouth daily with breakfast. 30 tablet 5  . roflumilast (DALIRESP) 500 MCG TABS tablet Take 500 mcg by mouth daily.    . sodium chloride HYPERTONIC 3 % nebulizer solution Take by nebulization 2 (two) times daily. 750 mL 11  . SPIRIVA HANDIHALER 18 MCG inhalation capsule INHALE THE CONTENTS OF 1 CAPSULE VIA HANDIHALER BY MOUTH EVERY MORNING 30 capsule 11  . SYMBICORT 160-4.5 MCG/ACT inhaler INHALE 2 PUFFS BY MOUTH TWICE DAILY 10.2 g 5  . tobramycin, PF, (TOBI) 300 MG/5ML nebulizer solution Take 5 mLs (300 mg total) by nebulization 2 (two) times daily. Every other month DX: J44.9 300 mL 5  . XARELTO 20 MG TABS tablet Take 20 mg by mouth daily with supper.  6  . verapamil (VERELAN PM) 360 MG 24 hr capsule Take 1 capsule (360 mg total) by mouth daily. 30 capsule 3   No current facility-administered medications for this encounter.    BP (!) 111/54   Pulse (!) 122   Wt 210 lb 8 oz (95.5 kg)   SpO2 90% Comment: on 5L of O2  BMI 35.03 kg/m  General: NAD Neck: No JVD, no thyromegaly or thyroid nodule.  Lungs: Distant breath sounds bilaterally. CV: Nondisplaced PMI.  Heart mildly tachy, regular S1/S2, no S3/S4, no murmur.  1+ right ankle edema.  No carotid bruit.  Normal pedal pulses.  Abdomen: Soft, nontender, no  hepatosplenomegaly,  no distention.  Skin: Intact without lesions or rashes.  Neurologic: Alert and oriented x 3.  Psych: Normal affect. Extremities: No clubbing or cyanosis.  HEENT: Normal.   Assessment/Plan: 1. COPD: On home oxygen, 5L at rest and 8L with exertion.  Severe COPD, Pseudomonas colonization.  Breathing has improved in the past with increased diuresis, suggesting that CHF plays a significant role in her symptoms in addition to COPD.  However, on exam today she looks euvolemic.  I think that most of her dyspnea at this point is from lung disease.  She has ILD on CT that may be due to chronic scarring from prior respiratory infections.   - Continue chronic prednisone 5 mg daily per pulmonary.  - She is on inhaled tobramycin for Pseudomonas colonization.  2. Atrial fibrillation: Paroxysmal, CHADSVASC = 4.  NSR today (sinus tachy). - She will continue Xarelto.  She has had epistaxis but not recently.  CBC today.  - She can continue verapamil.     3. Chronic diastolic CHF: NYHA class III symptoms, likely due to combination of CHF and COPD/ILD.  I suspect that her parenchymal lung disease, COPD and ILD, is the major contributor to her symptoms currently.  She looks near-euvolemic on exam.  Would not increase diuretics any further today.  - Continue Lasix 80 mg bid with metolazone twice a week. BMET today.  4. ?Shunt lesion: She had transcranial dopplers in 2013 suggesting a medium-sized right to left shunt.  This led to an extensive workup.  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  Cardiac MRI was unremarkable, no evidence for shunt lesion.  RHC/LHC in 2013 showed no coronary disease but it did show restrictive hemodynamics and volume overload.  Qp/Qs from this study was 1.27/1, suggesting a relatively small left to right shunt.  A definite shunt lesion was never identified.  Based on prior workup, it seems most likely that the right to left shunting by  transcranial dopplers and TEE bubble study was due to pulmonary AVMs.  RHC repeated in 2/17 did not show a significant shunt lesion, with Qp/Qs 0.97.  5. Inappropriate sinus tachycardia: Had discussed using Corlanor in the past but interacts with verapamil.  6. Pulmonary hypertension: Mild pulmonary hypertension on RHC with PVR only 2.5 WU.  Probably due to COPD/ILD (group 3) and elevated left atrial pressure (group 2).  No specific treatment other than diuresis.  7. Interstitial lung disease: May be due to lung scarring from prior respiratory infections.  Followed by Dr. Lake Bells.  Dyspnea gradually worsening over time.   Followup in 4 months.   Loralie Champagne 09/08/2017

## 2017-09-09 DIAGNOSIS — Z6834 Body mass index (BMI) 34.0-34.9, adult: Secondary | ICD-10-CM | POA: Diagnosis not present

## 2017-09-09 DIAGNOSIS — E7849 Other hyperlipidemia: Secondary | ICD-10-CM | POA: Diagnosis not present

## 2017-09-09 DIAGNOSIS — E1139 Type 2 diabetes mellitus with other diabetic ophthalmic complication: Secondary | ICD-10-CM | POA: Diagnosis not present

## 2017-09-09 DIAGNOSIS — N183 Chronic kidney disease, stage 3 (moderate): Secondary | ICD-10-CM | POA: Diagnosis not present

## 2017-09-09 DIAGNOSIS — I2789 Other specified pulmonary heart diseases: Secondary | ICD-10-CM | POA: Diagnosis not present

## 2017-09-09 DIAGNOSIS — E1129 Type 2 diabetes mellitus with other diabetic kidney complication: Secondary | ICD-10-CM | POA: Diagnosis not present

## 2017-09-09 DIAGNOSIS — I4891 Unspecified atrial fibrillation: Secondary | ICD-10-CM | POA: Diagnosis not present

## 2017-09-09 DIAGNOSIS — I10 Essential (primary) hypertension: Secondary | ICD-10-CM | POA: Diagnosis not present

## 2017-09-09 DIAGNOSIS — E1149 Type 2 diabetes mellitus with other diabetic neurological complication: Secondary | ICD-10-CM | POA: Diagnosis not present

## 2017-09-09 DIAGNOSIS — J9611 Chronic respiratory failure with hypoxia: Secondary | ICD-10-CM | POA: Diagnosis not present

## 2017-09-09 DIAGNOSIS — Z0000 Encounter for general adult medical examination without abnormal findings: Secondary | ICD-10-CM | POA: Diagnosis not present

## 2017-09-09 DIAGNOSIS — R82998 Other abnormal findings in urine: Secondary | ICD-10-CM | POA: Diagnosis not present

## 2017-09-09 DIAGNOSIS — J449 Chronic obstructive pulmonary disease, unspecified: Secondary | ICD-10-CM | POA: Diagnosis not present

## 2017-09-10 ENCOUNTER — Ambulatory Visit (INDEPENDENT_AMBULATORY_CARE_PROVIDER_SITE_OTHER): Payer: Medicare Other | Admitting: Pulmonary Disease

## 2017-09-10 ENCOUNTER — Encounter: Payer: Self-pay | Admitting: Pulmonary Disease

## 2017-09-10 VITALS — BP 132/60 | HR 110

## 2017-09-10 DIAGNOSIS — J441 Chronic obstructive pulmonary disease with (acute) exacerbation: Secondary | ICD-10-CM | POA: Diagnosis not present

## 2017-09-10 DIAGNOSIS — J9611 Chronic respiratory failure with hypoxia: Secondary | ICD-10-CM

## 2017-09-10 NOTE — Progress Notes (Signed)
Subjective:    Patient ID: Destiny Shelton, female    DOB: [birth year], 72 y.o.   MRN: [id number]  Synopsys: Former patient of Dr. Gwenette Shelton with pulmonary hypertension, Afib, and COPD.  She smoked 2 packs per day for 25 years and quit in the 1990's.  She has been on oxygen since 2012.  As of 2017 She has been using 5L O2 at rest and 6 L with exertion.   She does not have a history of PE that she is aware of.  A bronchoscopy in 2018 showed Pseudomonas.  She was started on daily azithromycin in 2018.  She was started on daily prednisone 5 mg daily in January 2019 due to persistent and recurrent exacerbations   HPI Chief Complaint  Patient presents with  . Follow-up    SOB at all time    In January 2019 we started daily prednisone 5 mg daily.  She has been taking azithromycin daily for quite some time.  Destiny Shelton says taht today her breathing is OK.  Early mornings are tough for her. She says when she walks a lot she feels short of breath.  She will bring up more mucus at different times of the day.  Sometimes she is producing green mucus.  She says that in the mornings she feels more chest congestoin.    She is still using and benefitting from her oxygen regularly.  She uses 5 L continuously though she often forgets to turn her oxygen level up to 8 L/min when she walks.  She has been compliant with prednisone, azithromycin, Symbicort, and Spiriva.    Past Medical History:  Diagnosis Date  . Adenomatous colon polyp   . ALLERGIC RHINITIS   . Anemia    iron deficient  . Anxiety   . Atrial tachycardia (HCC)    Mostly Sinus Tachycardia  . Back pain, chronic   . Carotid stenosis   . CHF (congestive heart failure) ([place])   . Community acquired pneumonia - Recent Admission (07/2013) 07/08/2013  . COPD (chronic obstructive pulmonary disease) ([place])    Requiring Home O2 at 4L  . Depression   . Diabetes mellitus type 2 with neurological manifestations ([place])    On Insulin  . Diverticulosis of colon     . External hemorrhoids   . Fibromyalgia    "pain in arms and shoulders."  . Gastritis   . GERD (gastroesophageal reflux disease)   . Headache(784.0)    tension  . Hyperlipemia   . Hypertension   . Inappropriate sinus tachycardia   . Morbidly obese ([place])   . Nephrolithiasis   . Neuromuscular disorder ([place])    diabetic neuropathy  . Osteoarthritis   . Osteoporosis   . PAF (paroxysmal atrial fibrillation) ([place])   . 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  . Sleep apnea    no cpap machine  02 at 4l/min all the time  . Urosepsis 05/26/2012      Review of Systems  Constitutional: Negative for chills, diaphoresis, fatigue and fever.  HENT: Negative for postnasal drip, rhinorrhea and sinus pressure.   Respiratory: Positive for cough, shortness of breath and wheezing.   Cardiovascular: Positive for leg swelling. Negative for chest pain and palpitations.       Objective:   Physical Exam Vitals:   09/10/17 1026 09/10/17 1029  BP: 132/60   Pulse:  (!) 110  SpO2:  90%   5L [place]   Gen: chronically ill appearing HENT: OP clear, TM's clear, neck supple PULM: Crackles/wheezes  bases, normal percussion CV: RRR, no mgr, trace edema GI: BS+, soft, nontender Derm: no cyanosis or rash Psyche: normal mood and affect    BMET    Component Value Date/Time   NA 140 09/07/2017 1446   K 4.2 09/07/2017 1446   CL 102 09/07/2017 1446   CO2 27 09/07/2017 1446   GLUCOSE 148 (H) 09/07/2017 1446   BUN 11 09/07/2017 1446   CREATININE 0.92 09/07/2017 1446   CREATININE 0.85 02/12/2015 0949   CALCIUM 8.6 (L) 09/07/2017 1446   GFRNONAA >60 09/07/2017 1446   GFRAA >60 09/07/2017 1446   Microbiology  BAL February 2018 Pseudomonas  PFT: PFT 2011:  FEV1 1.34 (55%), ratio 68, ++airtrapping on lung volumes, nl TLC, DLCO 40% pred. PFT's  03/23/2015  FEV1 1.61 (68 % ) ratio 69  p no % improvement from saba on   Symb/spiriva January 2018 pulmonary function testing ratio 70%, FEV1 1.63 L 70% predicted, FVC  2.33 L 76% predicted, total lung capacity 4.48 L 86% predicted, DLCO 5.04 21 percent predicted January 2019 PFT: Ratio 66%, FEV1 1.50 L, 65%, FEV1 2.28 L 75% predicted, total lung capacity 5.0 L 96% predicted, DLCO 5.21 mL 20% predicted   Heart Cath: LHC 12/2011: non-obstructive cad RHC 12/2011:  PA 45/29 with mean 78m, PCWP 22, CO Fick 10.25, PVR 1.17 wood units, slight step up in saturation from   RV to PA 07/04/2015 RHC: RA mean 9 RV 45/10 PA 41/20, mean 30 PCWP mean 13  Oxygen saturations: SVC 68% RA 65% RV 61% PA 69% LA 98% Peripheral sat 93%  Cardiac Output (Fick) 6.7  Cardiac Index (Fick) 3.3 PVR 2.5 WU Qp/Qs = 0.97 .  Imaging: CT chest 12/2011:  No PE, very mild mosaic perfusion abnl (prob secondary to her known airflow obstruction).  January 2018 high-resolution CT scan of the chest shows air trapping, irregular distribution upper lobe fibrotic changes (interlobular septal thickening, some patchy ground glass) in a patchy distribution not consistent with usual interstitial pneumonitis  Cardiac imaging: Myoview 2012: no ischemia Echo 2012: nl LV and EF, impaired relaxation, nothing to suggest pulm htn. Echo 72/67/1245  Nl LV, diastolic dysfxn, normal RV, estimated RVSP 46 TEE 03/2012:  A few late bubbles seen felt c/w small IP shunting.  Cardiac MRI 2013:  No infiltrative process.   Twelve-lead EKG: September 08, 2017 reviewed showing QTc interval of 4.65 ms     Assessment & Plan:    Chronic respiratory failure with hypoxia (HCC)  Chronic obstructive pulmonary disease with acute exacerbation (HCC)   Discussion: [facility]  Since starting daily prednisone and daily azithromycin she has had fewer exacerbations.  Ideally I would like to have her on nebulized tobramycin every other month to help reduce the rate of exacerbations but her insurer disagrees with me on this.  She has been compliant with her oxygen but she needs to use it more frequently at 8 L  when walking.  We discussed the importance of this today.  She should continue taking daily prednisone and azithromycin indefinitely for her severe COPD with recurrent exacerbations.  Chronic respiratory failure with hypoxemia: Use 5 L of oxygen when at rest and remember to always use 8 L of oxygen when exerting yourself  Severe COPD with recurrent exacerbations: Take azithromycin daily Take prednisone 5 mg daily Take Symbicort 2 puffs twice a day, Spiriva 1 puff daily  We will see you back in 2 months or sooner if needed  Current Outpatient Medications:  .  acetaminophen (TYLENOL) 500 MG tablet, Take 1 tablet (500 mg total) by mouth every 6 (six) hours as needed for mild pain or headache., Disp: 30 tablet, Rfl: 0 .  albuterol (PROVENTIL) (2.5 MG/3ML) 0.083% nebulizer solution, Take 3 mLs (2.5 mg total) by nebulization every 6 (six) hours as needed for wheezing or shortness of breath., Disp: 75 mL, Rfl: 3 .  ALPRAZolam (XANAX) 0.5 MG tablet, Take 0.25-0.5 mg by mouth daily as needed for anxiety. , Disp: , Rfl: 4 .  atorvastatin (LIPITOR) 40 MG tablet, Take 40 mg by mouth at bedtime. , Disp: , Rfl:  .  azithromycin (ZITHROMAX) 250 MG tablet, TAKE 1 TABLET BY MOUTH DAILY. START AFTER FINISHING LEVAQUIN, Disp: 90 tablet, Rfl: 3 .  benzonatate (TESSALON) 200 MG capsule, Take 400 mg by mouth at bedtime. , Disp: , Rfl:  .  budesonide-formoterol (SYMBICORT) 160-4.5 MCG/ACT inhaler, Inhale 2 puffs into the lungs 2 (two) times daily., Disp: 2 Inhaler, Rfl: 0 .  buPROPion (WELLBUTRIN XL) 300 MG 24 hr tablet, Take 300 mg by mouth daily., Disp: , Rfl:  .  DULoxetine (CYMBALTA) 60 MG capsule, Take 60 mg by mouth daily. , Disp: , Rfl:  .  furosemide (LASIX) 40 MG tablet, Take 2 tablets (80 mg total) by mouth 2 (two) times daily., Disp: 120 tablet, Rfl: 3 .  gabapentin (NEURONTIN) 600 MG tablet, Take 600 mg by mouth 3 (three) times daily. , Disp: , Rfl:  .  Guaifenesin (MUCINEX MAXIMUM STRENGTH) 1200 MG  TB12, Take 1,200 mg by mouth 2 (two) times daily as needed (for cough or congestion). , Disp: , Rfl:  .  HYDROcodone-acetaminophen (NORCO/VICODIN) 5-325 MG per tablet, Take 1 tablet by mouth See admin instructions. EVERY 4-6 HOURS AS NEEDED FOR PAIN OR SHORTNESS OF BREATH, Disp: , Rfl:  .  insulin aspart (NOVOLOG FLEXPEN) 100 UNIT/ML FlexPen, Inject 5 Units into the skin 3 (three) times daily as needed for high blood sugar., Disp: , Rfl:  .  insulin glargine (LANTUS) 100 UNIT/ML injection, Inject 0.66 mLs (66 Units total) into the skin daily before breakfast. (Patient taking differently: Inject 64 Units into the skin daily before breakfast. ), Disp: , Rfl:  .  irbesartan (AVAPRO) 150 MG tablet, Take 1 tablet (150 mg total) by mouth daily., Disp: 90 tablet, Rfl: 3 .  iron polysaccharides (NIFEREX) 150 MG capsule, Take 1 capsule (150 mg total) by mouth 2 (two) times daily. (Patient taking differently: Take 150 mg by mouth daily. ), Disp: 60 capsule, Rfl: 0 .  levalbuterol (XOPENEX HFA) 45 MCG/ACT inhaler, Inhale 1-2 puffs into the lungs every 4 (four) hours as needed for wheezing. (Patient taking differently: Inhale 2 puffs into the lungs every 4 (four) hours as needed for wheezing or shortness of breath. ), Disp: 1 Inhaler, Rfl: 12 .  metolazone (ZAROXOLYN) 5 MG tablet, Take 5 mg by mouth 2 (two) times a week. Wednesday and Friday, Disp: , Rfl:  .  Multiple Vitamin (MULTIVITAMIN WITH MINERALS) TABS tablet, Take 1 tablet by mouth daily., Disp: , Rfl:  .  omeprazole (PRILOSEC OTC) 20 MG tablet, Take 20 mg by mouth at bedtime., Disp: , Rfl:  .  OXYGEN, Inhale 5-8 L into the lungs See admin instructions. 5 liters when at rest and 8 liters when ambulatory, Disp: , Rfl:  .  polyethylene glycol (MIRALAX / GLYCOLAX) packet, Take 17 g by mouth daily. Mix in 8 oz liquid and drink, Disp: , Rfl:  .  polyvinyl alcohol (ARTIFICIAL TEARS) 1.4 % ophthalmic solution, Place 1 drop into both eyes 2 (two) times daily. ,  Disp: , Rfl:  .  potassium chloride (K-DUR,KLOR-CON) 10 MEQ tablet, Take 3 tablets (30 mEq total) by mouth 2 (two) times daily., Disp: 180 tablet, Rfl: 5 .  predniSONE (DELTASONE) 5 MG tablet, Take 1 tablet (5 mg total) by mouth daily with breakfast., Disp: 30 tablet, Rfl: 5 .  roflumilast (DALIRESP) 500 MCG TABS tablet, Take 500 mcg by mouth daily., Disp: , Rfl:  .  sodium chloride HYPERTONIC 3 % nebulizer solution, Take by nebulization 2 (two) times daily., Disp: 750 mL, Rfl: 11 .  SPIRIVA HANDIHALER 18 MCG inhalation capsule, INHALE THE CONTENTS OF 1 CAPSULE VIA HANDIHALER BY MOUTH EVERY MORNING, Disp: 30 capsule, Rfl: 11 .  SYMBICORT 160-4.5 MCG/ACT inhaler, INHALE 2 PUFFS BY MOUTH TWICE DAILY, Disp: 10.2 g, Rfl: 5 .  tobramycin, PF, (TOBI) 300 MG/5ML nebulizer solution, Take 5 mLs (300 mg total) by nebulization 2 (two) times daily. Every other month DX: J44.9, Disp: 300 mL, Rfl: 5 .  verapamil (VERELAN PM) 360 MG 24 hr capsule, Take 1 capsule (360 mg total) by mouth daily., Disp: 30 capsule, Rfl: 3 .  XARELTO 20 MG TABS tablet, Take 20 mg by mouth daily with supper., Disp: , Rfl: 6

## 2017-09-10 NOTE — Patient Instructions (Signed)
Chronic respiratory failure with hypoxemia: Use 5 L of oxygen when at rest and remember to always use 8 L of oxygen when exerting yourself  Severe COPD with recurrent exacerbations: Take azithromycin daily Take prednisone 5 mg daily Take Symbicort 2 puffs twice a day, Spiriva 1 puff daily  We will see you back in 2 months or sooner if needed

## 2017-09-16 DIAGNOSIS — R040 Epistaxis: Secondary | ICD-10-CM | POA: Diagnosis not present

## 2017-10-01 ENCOUNTER — Other Ambulatory Visit (HOSPITAL_COMMUNITY): Payer: Self-pay | Admitting: Internal Medicine

## 2017-10-01 DIAGNOSIS — I6529 Occlusion and stenosis of unspecified carotid artery: Secondary | ICD-10-CM

## 2017-10-02 ENCOUNTER — Ambulatory Visit (HOSPITAL_COMMUNITY)
Admission: RE | Admit: 2017-10-02 | Discharge: 2017-10-02 | Disposition: A | Discharge status: Home / Self Care | Payer: Medicare Other | Source: Ambulatory Visit | Attending: Vascular Surgery | Admitting: Vascular Surgery

## 2017-10-02 DIAGNOSIS — I6523 Occlusion and stenosis of bilateral carotid arteries: Secondary | ICD-10-CM | POA: Diagnosis not present

## 2017-10-02 DIAGNOSIS — I6529 Occlusion and stenosis of unspecified carotid artery: Secondary | ICD-10-CM | POA: Diagnosis not present

## 2017-10-11 ENCOUNTER — Other Ambulatory Visit: Payer: Self-pay | Admitting: Pulmonary Disease

## 2017-11-10 ENCOUNTER — Ambulatory Visit (INDEPENDENT_AMBULATORY_CARE_PROVIDER_SITE_OTHER): Payer: Medicare Other | Admitting: Pulmonary Disease

## 2017-11-10 ENCOUNTER — Ambulatory Visit (INDEPENDENT_AMBULATORY_CARE_PROVIDER_SITE_OTHER)
Admission: RE | Admit: 2017-11-10 | Discharge: 2017-11-10 | Disposition: A | Discharge status: Home / Self Care | Payer: Medicare Other | Source: Ambulatory Visit | Attending: Pulmonary Disease | Admitting: Pulmonary Disease

## 2017-11-10 ENCOUNTER — Encounter: Payer: Self-pay | Admitting: Pulmonary Disease

## 2017-11-10 VITALS — BP 128/70 | HR 116 | Ht 65.0 in | Wt 216.0 lb

## 2017-11-10 DIAGNOSIS — J441 Chronic obstructive pulmonary disease with (acute) exacerbation: Secondary | ICD-10-CM | POA: Diagnosis not present

## 2017-11-10 DIAGNOSIS — R0600 Dyspnea, unspecified: Secondary | ICD-10-CM

## 2017-11-10 DIAGNOSIS — I6529 Occlusion and stenosis of unspecified carotid artery: Secondary | ICD-10-CM

## 2017-11-10 DIAGNOSIS — J9611 Chronic respiratory failure with hypoxia: Secondary | ICD-10-CM | POA: Diagnosis not present

## 2017-11-10 DIAGNOSIS — J811 Chronic pulmonary edema: Secondary | ICD-10-CM | POA: Diagnosis not present

## 2017-11-10 MED ORDER — PREDNISONE 10 MG PO TABS: 40.0000 mg | ORAL_TABLET | Freq: Every day | ORAL | 0 refills | Status: AC

## 2017-11-10 MED ORDER — LEVOFLOXACIN 500 MG PO TABS: 500.0000 mg | ORAL_TABLET | Freq: Every day | ORAL | 0 refills | Status: AC

## 2017-11-10 NOTE — Progress Notes (Signed)
Subjective:    Patient ID: Destiny Shelton, female    DOB: 11/18/1945, 72 y.o.   MRN: [id number]  Synopsys: Former patient of Dr. Gwenette Greet with pulmonary hypertension, Afib, and COPD.  She smoked 2 packs per day for 25 years and quit in the 1990's.  She has been on oxygen since 2012.  As of 2017 She has been using 5L O2 at rest and 6 L with exertion.   She does not have a history of PE that she is aware of.  A bronchoscopy in 2018 showed Pseudomonas.  She was started on daily azithromycin in 2018.  She was started on daily prednisone 5 mg daily in January 2019 due to persistent and recurrent exacerbations   HPI Chief Complaint  Patient presents with  . Follow-up    2 month ROV, increased SOB   Destiny Shelton says "I have been better". > she is still coughing up green mucus > she says her breathing isn't improving > she is staying in the house near constantly > sometimes she can walk from one room to the next without difficulty, but other times she can't do this > she is using her nebulizer which helps > she paces herself as much as possible > she says that the mucus in her chest has been worse in the last few weeks, she is coughing up more green mucus > no fever, no chills.   > she remains compliant with her symbicort > she says that she produces more mucus in the mornings > no recent leg swelling that is worse than baseline (she has some normally in the right leg)   Past Medical History:  Diagnosis Date  . Adenomatous colon polyp   . ALLERGIC RHINITIS   . Anemia    iron deficient  . Anxiety   . Atrial tachycardia (HCC)    Mostly Sinus Tachycardia  . Back pain, chronic   . Carotid stenosis   . CHF (congestive heart failure) ([place])   . Community acquired pneumonia - Recent Admission (07/2013) 07/08/2013  . COPD (chronic obstructive pulmonary disease) ([place])    Requiring Home O2 at 4L  . Depression   . Diabetes mellitus type 2 with neurological manifestations ([place])    On Insulin  .  Diverticulosis of colon   . External hemorrhoids   . Fibromyalgia    "pain in arms and shoulders."  . Gastritis   . GERD (gastroesophageal reflux disease)   . Headache(784.0)    tension  . Hyperlipemia   . Hypertension   . Inappropriate sinus tachycardia   . Morbidly obese ([place])   . Nephrolithiasis   . Neuromuscular disorder ([place])    diabetic neuropathy  . Osteoarthritis   . Osteoporosis   . PAF (paroxysmal atrial fibrillation) ([place])   . 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  . Sleep apnea    no cpap machine  02 at 4l/min all the time  . Urosepsis 05/26/2012      Review of Systems  Constitutional: Negative for chills, diaphoresis, fatigue and fever.  HENT: Negative for postnasal drip, rhinorrhea and sinus pressure.   Respiratory: Positive for cough, shortness of breath and wheezing.   Cardiovascular: Positive for leg swelling. Negative for chest pain and palpitations.       Objective:   Physical Exam Vitals:   11/10/17 0917  BP: 128/70  Pulse: (!) 116  SpO2: 91%  Weight: 216 lb (98 kg)  Height: _0  (1.651 m)   8L [place]  Gen: chronically ill  appearing HENT: OP clear,  neck supple PULM: Crackles bases, poor air movement B, normal percussion CV: RRR, no mgr, trace edema GI: BS+, soft, nontender Derm: no cyanosis or rash Psyche: normal mood and affect     BMET    Component Value Date/Time   NA 140 09/07/2017 1446   K 4.2 09/07/2017 1446   CL 102 09/07/2017 1446   CO2 27 09/07/2017 1446   GLUCOSE 148 (H) 09/07/2017 1446   BUN 11 09/07/2017 1446   CREATININE 0.92 09/07/2017 1446   CREATININE 0.85 02/12/2015 0949   CALCIUM 8.6 (L) 09/07/2017 1446   GFRNONAA >60 09/07/2017 1446   GFRAA >60 09/07/2017 1446   Microbiology  BAL February 2018 Pseudomonas  PFT: PFT 2011:  FEV1 1.34 (55%), ratio 68, ++airtrapping on lung volumes, nl TLC, DLCO 40% pred. PFT's  03/23/2015  FEV1 1.61 (68 % ) ratio 69  p no % improvement from saba on   Symb/spiriva January 2018 pulmonary  function testing ratio 70%, FEV1 1.63 L 70% predicted, FVC 2.33 L 76% predicted, total lung capacity 4.48 L 86% predicted, DLCO 5.04 21 percent predicted January 2019 PFT: Ratio 66%, FEV1 1.50 L, 65%, FEV1 2.28 L 75% predicted, total lung capacity 5.0 L 96% predicted, DLCO 5.21 mL 20% predicted   Heart Cath: LHC 12/2011: non-obstructive cad RHC 12/2011:  PA 45/29 with mean 63m, PCWP 22, CO Fick 10.25, PVR 1.17 wood units, slight step up in saturation from   RV to PA 07/04/2015 RHC: RA mean 9 RV 45/10 PA 41/20, mean 30 PCWP mean 13  Oxygen saturations: SVC 68% RA 65% RV 61% PA 69% LA 98% Peripheral sat 93%  Cardiac Output (Fick) 6.7  Cardiac Index (Fick) 3.3 PVR 2.5 WU Qp/Qs = 0.97 .  Imaging: CT chest 12/2011:  No PE, very mild mosaic perfusion abnl (prob secondary to her known airflow obstruction).  January 2018 high-resolution CT scan of the chest shows air trapping, irregular distribution upper lobe fibrotic changes (interlobular septal thickening, some patchy ground glass) in a patchy distribution not consistent with usual interstitial pneumonitis  Cardiac imaging: Myoview 2012: no ischemia Echo 2012: nl LV and EF, impaired relaxation, nothing to suggest pulm htn. Echo 71/06/7508  Nl LV, diastolic dysfxn, normal RV, estimated RVSP 46 TEE 03/2012:  A few late bubbles seen felt c/w small IP shunting.  Cardiac MRI 2013:  No infiltrative process.   Twelve-lead EKG: September 08, 2017 reviewed showing QTc interval of 4.65 ms     Assessment & Plan:    Dyspnea, unspecified type - Plan: DG Chest 2 View  Chronic respiratory failure with hypoxia (HCC)  Chronic obstructive pulmonary disease with acute exacerbation (HCC)  COPD with acute exacerbation (HCC)   Discussion: MVernonis having a flareup of her severe COPD and that she has been producing more mucus over the last several weeks.  Further, she needed more oxygen when she presented today.  She has not gained weight, there is no significant leg  swelling so I doubt there is anything other than a pulmonary etiology here.  We will check a chest x-ray to make sure there is nothing else going on but I suspect this is a flare of COPD.  Plan: Severe COPD with recurrent exacerbations: Take prednisone 40 mg daily x5 days Take Levaquin 500 mg daily x5 days Do not take azithromycin when you are taking the Levaquin, finishing the Levaquin you can resume the azithromycin After taking the prednisone 40 mg daily go back to  taking 5 mg daily Take Symbicort 2 puffs twice a day Take Spiriva daily Use albuterol as needed for chest tightness wheezing or shortness of breath Continue Mucinex daily  Chronic respiratory failure with hypoxemia: Continue 5 L at rest, 8 L with exertion Because you feel little bit worse today we will get a chest x-ray to make sure there is nothing else going on other than a flare of COPD  Return to clinic in 2 months or sooner if needed   Current Outpatient Medications:  .  acetaminophen (TYLENOL) 500 MG tablet, Take 1 tablet (500 mg total) by mouth every 6 (six) hours as needed for mild pain or headache., Disp: 30 tablet, Rfl: 0 .  albuterol (PROVENTIL) (2.5 MG/3ML) 0.083% nebulizer solution, Take 3 mLs (2.5 mg total) by nebulization every 6 (six) hours as needed for wheezing or shortness of breath., Disp: 75 mL, Rfl: 3 .  ALPRAZolam (XANAX) 0.5 MG tablet, Take 0.25-0.5 mg by mouth daily as needed for anxiety. , Disp: , Rfl: 4 .  atorvastatin (LIPITOR) 40 MG tablet, Take 40 mg by mouth at bedtime. , Disp: , Rfl:  .  azithromycin (ZITHROMAX) 250 MG tablet, TAKE 1 TABLET BY MOUTH DAILY. START AFTER FINISHING LEVAQUIN, Disp: 90 tablet, Rfl: 3 .  benzonatate (TESSALON) 200 MG capsule, Take 400 mg by mouth at bedtime. , Disp: , Rfl:  .  budesonide-formoterol (SYMBICORT) 160-4.5 MCG/ACT inhaler, Inhale 2 puffs into the lungs 2 (two) times daily., Disp: 2 Inhaler, Rfl: 0 .  buPROPion (WELLBUTRIN XL) 300 MG 24 hr tablet, Take 300  mg by mouth daily., Disp: , Rfl:  .  DULoxetine (CYMBALTA) 60 MG capsule, Take 60 mg by mouth daily. , Disp: , Rfl:  .  furosemide (LASIX) 40 MG tablet, Take 2 tablets (80 mg total) by mouth 2 (two) times daily., Disp: 120 tablet, Rfl: 3 .  gabapentin (NEURONTIN) 600 MG tablet, Take 600 mg by mouth 3 (three) times daily. , Disp: , Rfl:  .  Guaifenesin (MUCINEX MAXIMUM STRENGTH) 1200 MG TB12, Take 1,200 mg by mouth 2 (two) times daily as needed (for cough or congestion). , Disp: , Rfl:  .  HYDROcodone-acetaminophen (NORCO/VICODIN) 5-325 MG per tablet, Take 1 tablet by mouth See admin instructions. EVERY 4-6 HOURS AS NEEDED FOR PAIN OR SHORTNESS OF BREATH, Disp: , Rfl:  .  insulin aspart (NOVOLOG FLEXPEN) 100 UNIT/ML FlexPen, Inject 5 Units into the skin 3 (three) times daily as needed for high blood sugar., Disp: , Rfl:  .  insulin glargine (LANTUS) 100 UNIT/ML injection, Inject 0.66 mLs (66 Units total) into the skin daily before breakfast. (Patient taking differently: Inject 64 Units into the skin daily before breakfast. ), Disp: , Rfl:  .  irbesartan (AVAPRO) 150 MG tablet, Take 1 tablet (150 mg total) by mouth daily., Disp: 90 tablet, Rfl: 3 .  iron polysaccharides (NIFEREX) 150 MG capsule, Take 1 capsule (150 mg total) by mouth 2 (two) times daily. (Patient taking differently: Take 150 mg by mouth daily. ), Disp: 60 capsule, Rfl: 0 .  levalbuterol (XOPENEX HFA) 45 MCG/ACT inhaler, Inhale 1-2 puffs into the lungs every 4 (four) hours as needed for wheezing. (Patient taking differently: Inhale 2 puffs into the lungs every 4 (four) hours as needed for wheezing or shortness of breath. ), Disp: 1 Inhaler, Rfl: 12 .  metolazone (ZAROXOLYN) 5 MG tablet, Take 5 mg by mouth 2 (two) times a week. Wednesday and Friday, Disp: , Rfl:  .  Multiple  Vitamin (MULTIVITAMIN WITH MINERALS) TABS tablet, Take 1 tablet by mouth daily., Disp: , Rfl:  .  omeprazole (PRILOSEC OTC) 20 MG tablet, Take 20 mg by mouth at  bedtime., Disp: , Rfl:  .  OXYGEN, Inhale 5-8 L into the lungs See admin instructions. 5 liters when at rest and 8 liters when ambulatory, Disp: , Rfl:  .  polyethylene glycol (MIRALAX / GLYCOLAX) packet, Take 17 g by mouth daily. Mix in 8 oz liquid and drink, Disp: , Rfl:  .  polyvinyl alcohol (ARTIFICIAL TEARS) 1.4 % ophthalmic solution, Place 1 drop into both eyes 2 (two) times daily. , Disp: , Rfl:  .  potassium chloride (K-DUR,KLOR-CON) 10 MEQ tablet, Take 3 tablets (30 mEq total) by mouth 2 (two) times daily., Disp: 180 tablet, Rfl: 5 .  predniSONE (DELTASONE) 5 MG tablet, Take 1 tablet (5 mg total) by mouth daily with breakfast., Disp: 30 tablet, Rfl: 5 .  roflumilast (DALIRESP) 500 MCG TABS tablet, Take 500 mcg by mouth daily., Disp: , Rfl:  .  sodium chloride HYPERTONIC 3 % nebulizer solution, Take by nebulization 2 (two) times daily., Disp: 750 mL, Rfl: 11 .  SPIRIVA HANDIHALER 18 MCG inhalation capsule, INHALE THE CONTENTS OF 1 CAPSULE VIA HANDIHALER BY MOUTH EVERY MORNING, Disp: 30 capsule, Rfl: 3 .  SYMBICORT 160-4.5 MCG/ACT inhaler, INHALE 2 PUFFS BY MOUTH TWICE DAILY, Disp: 10.2 g, Rfl: 5 .  tobramycin, PF, (TOBI) 300 MG/5ML nebulizer solution, Take 5 mLs (300 mg total) by nebulization 2 (two) times daily. Every other month DX: J44.9, Disp: 300 mL, Rfl: 5 .  verapamil (VERELAN PM) 360 MG 24 hr capsule, Take 1 capsule (360 mg total) by mouth daily., Disp: 30 capsule, Rfl: 3 .  XARELTO 20 MG TABS tablet, Take 20 mg by mouth daily with supper., Disp: , Rfl: 6

## 2017-11-10 NOTE — Patient Instructions (Signed)
Severe COPD with recurrent exacerbations: Take prednisone 40 mg daily x5 days Take Levaquin 500 mg daily x5 days Do not take azithromycin when you are taking the Levaquin, finishing the Levaquin you can resume the azithromycin After taking the prednisone 40 mg daily go back to taking 5 mg daily Take Symbicort 2 puffs twice a day Take Spiriva daily Use albuterol as needed for chest tightness wheezing or shortness of breath Continue Mucinex daily  Chronic respiratory failure with hypoxemia: Continue 5 L at rest, 8 L with exertion Because you feel little bit worse today we will get a chest x-ray to make sure there is nothing else going on other than a flare of COPD  Return to clinic in 2 months or sooner if needed

## 2017-11-30 ENCOUNTER — Other Ambulatory Visit (HOSPITAL_COMMUNITY): Payer: Self-pay

## 2017-11-30 MED ORDER — FUROSEMIDE 40 MG PO TABS: 80.0000 mg | ORAL_TABLET | Freq: Two times a day (BID) | ORAL | 3 refills | Status: DC

## 2017-12-17 ENCOUNTER — Other Ambulatory Visit: Payer: Self-pay | Admitting: Pulmonary Disease

## 2017-12-21 ENCOUNTER — Other Ambulatory Visit: Payer: Self-pay | Admitting: Pulmonary Disease

## 2017-12-29 ENCOUNTER — Other Ambulatory Visit (HOSPITAL_COMMUNITY): Payer: Self-pay

## 2017-12-29 MED ORDER — IRBESARTAN 150 MG PO TABS: 150.0000 mg | ORAL_TABLET | Freq: Every day | ORAL | 2 refills | Status: DC

## 2018-01-07 DIAGNOSIS — J449 Chronic obstructive pulmonary disease, unspecified: Secondary | ICD-10-CM | POA: Diagnosis not present

## 2018-01-07 DIAGNOSIS — N183 Chronic kidney disease, stage 3 (moderate): Secondary | ICD-10-CM | POA: Diagnosis not present

## 2018-01-07 DIAGNOSIS — Z7901 Long term (current) use of anticoagulants: Secondary | ICD-10-CM | POA: Diagnosis not present

## 2018-01-07 DIAGNOSIS — Z6836 Body mass index (BMI) 36.0-36.9, adult: Secondary | ICD-10-CM | POA: Diagnosis not present

## 2018-01-07 DIAGNOSIS — H6123 Impacted cerumen, bilateral: Secondary | ICD-10-CM | POA: Diagnosis not present

## 2018-01-07 DIAGNOSIS — Z9981 Dependence on supplemental oxygen: Secondary | ICD-10-CM | POA: Diagnosis not present

## 2018-01-07 DIAGNOSIS — I5032 Chronic diastolic (congestive) heart failure: Secondary | ICD-10-CM | POA: Diagnosis not present

## 2018-01-07 DIAGNOSIS — E1139 Type 2 diabetes mellitus with other diabetic ophthalmic complication: Secondary | ICD-10-CM | POA: Diagnosis not present

## 2018-01-07 DIAGNOSIS — I4891 Unspecified atrial fibrillation: Secondary | ICD-10-CM | POA: Diagnosis not present

## 2018-01-07 DIAGNOSIS — R300 Dysuria: Secondary | ICD-10-CM | POA: Diagnosis not present

## 2018-01-07 DIAGNOSIS — I2789 Other specified pulmonary heart diseases: Secondary | ICD-10-CM | POA: Diagnosis not present

## 2018-01-07 DIAGNOSIS — J9620 Acute and chronic respiratory failure, unspecified whether with hypoxia or hypercapnia: Secondary | ICD-10-CM | POA: Diagnosis not present

## 2018-01-11 ENCOUNTER — Ambulatory Visit (HOSPITAL_COMMUNITY)
Admission: RE | Admit: 2018-01-11 | Discharge: 2018-01-11 | Disposition: A | Discharge status: Home / Self Care | Payer: Medicare Other | Source: Ambulatory Visit | Attending: Cardiology | Admitting: Cardiology

## 2018-01-11 VITALS — BP 100/39 | HR 119 | Wt 221.4 lb

## 2018-01-11 DIAGNOSIS — E669 Obesity, unspecified: Secondary | ICD-10-CM | POA: Diagnosis not present

## 2018-01-11 DIAGNOSIS — J449 Chronic obstructive pulmonary disease, unspecified: Secondary | ICD-10-CM | POA: Diagnosis not present

## 2018-01-11 DIAGNOSIS — R000 Tachycardia, unspecified: Secondary | ICD-10-CM | POA: Diagnosis not present

## 2018-01-11 DIAGNOSIS — I480 Paroxysmal atrial fibrillation: Secondary | ICD-10-CM | POA: Diagnosis not present

## 2018-01-11 DIAGNOSIS — Z794 Long term (current) use of insulin: Secondary | ICD-10-CM | POA: Insufficient documentation

## 2018-01-11 DIAGNOSIS — Z79899 Other long term (current) drug therapy: Secondary | ICD-10-CM | POA: Insufficient documentation

## 2018-01-11 DIAGNOSIS — J849 Interstitial pulmonary disease, unspecified: Secondary | ICD-10-CM | POA: Diagnosis not present

## 2018-01-11 DIAGNOSIS — G4733 Obstructive sleep apnea (adult) (pediatric): Secondary | ICD-10-CM | POA: Insufficient documentation

## 2018-01-11 DIAGNOSIS — Z7951 Long term (current) use of inhaled steroids: Secondary | ICD-10-CM | POA: Diagnosis not present

## 2018-01-11 DIAGNOSIS — E119 Type 2 diabetes mellitus without complications: Secondary | ICD-10-CM | POA: Insufficient documentation

## 2018-01-11 DIAGNOSIS — Z87891 Personal history of nicotine dependence: Secondary | ICD-10-CM | POA: Diagnosis not present

## 2018-01-11 DIAGNOSIS — Z7901 Long term (current) use of anticoagulants: Secondary | ICD-10-CM | POA: Insufficient documentation

## 2018-01-11 DIAGNOSIS — Z6836 Body mass index (BMI) 36.0-36.9, adult: Secondary | ICD-10-CM | POA: Diagnosis not present

## 2018-01-11 DIAGNOSIS — Z7952 Long term (current) use of systemic steroids: Secondary | ICD-10-CM | POA: Insufficient documentation

## 2018-01-11 DIAGNOSIS — Z9981 Dependence on supplemental oxygen: Secondary | ICD-10-CM | POA: Diagnosis not present

## 2018-01-11 DIAGNOSIS — I272 Pulmonary hypertension, unspecified: Secondary | ICD-10-CM | POA: Diagnosis not present

## 2018-01-11 DIAGNOSIS — I10 Essential (primary) hypertension: Secondary | ICD-10-CM

## 2018-01-11 DIAGNOSIS — I110 Hypertensive heart disease with heart failure: Secondary | ICD-10-CM | POA: Diagnosis not present

## 2018-01-11 DIAGNOSIS — I4711 Inappropriate sinus tachycardia, so stated: Secondary | ICD-10-CM

## 2018-01-11 DIAGNOSIS — I5032 Chronic diastolic (congestive) heart failure: Secondary | ICD-10-CM | POA: Diagnosis not present

## 2018-01-11 LAB — BASIC METABOLIC PANEL
ANION GAP: 12 (ref 5–15)
BUN: 30 mg/dL — AB (ref 8–23)
CALCIUM: 8.8 mg/dL — AB (ref 8.9–10.3)
CO2: 31 mmol/L (ref 22–32)
Chloride: 95 mmol/L — ABNORMAL LOW (ref 98–111)
Creatinine, Ser: 1.28 mg/dL — ABNORMAL HIGH (ref 0.44–1.00)
GFR calc Af Amer: 47 mL/min — ABNORMAL LOW (ref >60)
GFR calc non Af Amer: 40 mL/min — ABNORMAL LOW (ref >60)
GLUCOSE: 191 mg/dL — AB (ref 70–99)
POTASSIUM: 3.5 mmol/L (ref 3.5–5.1)
SODIUM: 138 mmol/L (ref 135–145)

## 2018-01-11 MED ORDER — TORSEMIDE 20 MG PO TABS: ORAL_TABLET | ORAL | 11 refills | Status: AC

## 2018-01-11 NOTE — Progress Notes (Signed)
Patient ID: Destiny Shelton, female   DOB: 1945/06/12, 72 y.o.   MRN: [id number]  PCP: Dr. Brigitte Pulse HF Cardiology: Dr. Aundra Dubin  72 y.o.with history of paroxysmal atrial fibrillation, chronic diastolic CHF with restrictive hemodynamics on 2013 RHC, COPD on home oxygen, inappropriate sinus tachycardia, and concern for intracardiac shunting presents for followup of diastolic CHF.  She has been followed for several years by both cardiology and pulmonology.  She is a prior smoker and has been diagnosed with COPD.  Interestingly, her PFTs in 10/16 showed improvement, suggesting mild obstructive airways disease.  However, she still requires home oxygen.  She had transcranial dopplers in 2013 suggesting a medium-sized right to left shunt.  This led to an extensive workup.  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  Cardiac MRI was unremarkable, no evidence for shunt lesion.  RHC/LHC showed no coronary disease but it did show restrictive hemodynamics and volume overload.  Qp/Qs from this study was 1.27/1, suggesting a relatively small left to right shunt.  A definite shunt lesion was never identified.    Echo in 2/17 showed normal LV size and systolic function and normal RV.  I also did a RHC in 2/17: on higher dose diuretics, she had mild pulmonary hypertension and normal PCWP.  There was no evidence for a shunt lesion with Qp/Qs 0.97.   She was admitted in 9/17 with acute exacerbation of COPD.  She was treated with steroids and nebs.  She was admitted in 12/17 with suspected COPD exacerbation, treated with steroids and levofloxacin.  Lasix was decreased to 80 mg once daily but I increased it back to 80 qam/40 qpm when I saw her in followup, later up to Lasix 80 mg bid.  Subsequently, metolazone was started twice a week.   In 8/18, she was admitted with PNA and severe epistaxis.  She was in the ER again with epistaxis in 10/18.  She subsequently saw an ENT with cautery done in her nose.  She has  had only 1 episode of epistaxis since then and is on Xarelto still.    Weight today is up about 11 lbs compared to last appointment.  She is more short of breath, dyspnea now with walking across the house.  She has slept in a recliner long-term.  No chest pain.  No lightheadedness/syncope.  BP soft, SBP runs 90s-100s at home.  She takes inhaled tobramycin with chronic Pseudomonas, and she is on Daliresp.  She is using oxygen 5L at rest, 8L with exertion.   Labs (9/16): digoxin 0.6, K 4.3, creatinine 0.85 Labs (1/17): K 3.9, creatinine 0.96, hgb 8.9 Labs (3/17): K 4.2, creatinine 0.96 Labs (6/17): K 4.2, creatinine 1.03 Labs (8/17): K 4.1, creatinine 0.81, HCT 34.3 Labs (9/17): K 3.7, creatinine 0.91, hgb 12.1 Labs (12/17): K 3.4, creatinine 0.84, hgb 13 Labs (1/18): K 4.2, creatinine 1.27 Labs (6/18): pro-BNP 42 Labs (7/18): K 4.5, creatinine 0.93 Labs (10/18): K 3.8, creatinine 1.38, hgb 8.1 Labs (12/18): K 4.4, creatinine 1.11, hgb 8.4 Labs (4/19): K 4.2, creatinine 0.92, hgb 10.6  ECG (personally reviewed): Sinus tachycardia, o/w normal  PMH: 1. Atrial fibrillation: Paroxysmal.  2. HTN 3. COPD: Home oxygen.  PFTs (10/16) with FVC 77%, FEV1 68%, TLC 89%.  Now severe.  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  4. Type II diabetes 5. Obesity 6. Inappropriate sinus tachycardia 7. OSA: Does not use CPAP, uses oxygen at night.  8. Chronic diastolic CHF: RHC/LHC in 4/97 with no significant CAD, mean  RA 19, RV 46/18, PA 45/29 mean 37, mean PCWP 22, CI 4.1, PVR 1.4 WU => restrictive hemodynamics with no evidence for constriction;  Qp/Qs 1.27/1; saturation run difficult to interpret.  TEE (10/13): EF 55-60%, LVH, mild MR, bubble study showed no evidence for immediate R=>L bubble crossing, small amount of bubbles ended up in left heart > 5 seconds after injection suggesting possible intrapulmonary shunting; no shunt by color doppler.  Cardiac MRI (8/13) with EF 59%, no LGE, normal RV size/systolic  function => interpreted as normal.  RHC 07/04/2015: RA mean 9, RV 45/10, PA 41/20 mean 30, PCWP mean 13, no step up on shunt run,cardiac output (Fick) 6.7 cardiac index (Fick) 3.3, PVR 2.5 WU, Qp/Qs = 0.97. Echo (2/17) with EF 55-60%, mild LVH, mild MR, normal RV size and systolic function.  9. ?Intracardiac shunt: Transcranial dopplers in 10/13 were suggestive of medium-sized right to left shunt.  Bubble study on TEE in 10/13 showed no evidence for immediate R=>L bubble crossing, small amount of bubbles ended up in left heart > 5 seconds after injection suggesting possible intrapulmonary shunting; no shunt by color doppler. Cardiac MRI showed no evidence for shunting.  Cardiac cath in 2013 actually was suggestive of a small left to right shunt.  Cardiac cath in 2/17 NOT suggestive of significant shunt, Qp/Qs 0.97.  10. Interstitial lung disease: HRCT in 1/18 was concerning for ILD, possibly scarring from prior respiratory infections.   SH: Married, prior heavy smoker (quit 1996).  No ETOH.   Family History  Problem Relation Age of Onset  . Heart disease Mother   . Stroke Mother   . Heart disease Father   . Heart disease Brother   . Stroke Brother   . Skin cancer Brother   . Colon polyps Sister   . Diabetes Sister   . Diabetes Brother   . Irritable bowel syndrome Daughter   . Colon cancer Neg Hx    ROS: All systems reviewed and negative except as per HPI.   Current Outpatient Medications  Medication Sig Dispense Refill  . acetaminophen (TYLENOL) 500 MG tablet Take 1 tablet (500 mg total) by mouth every 6 (six) hours as needed for mild pain or headache. 30 tablet 0  . albuterol (PROVENTIL) (2.5 MG/3ML) 0.083% nebulizer solution Take 3 mLs (2.5 mg total) by nebulization every 6 (six) hours as needed for wheezing or shortness of breath. 75 mL 3  . ALPRAZolam (XANAX) 0.5 MG tablet Take 0.25-0.5 mg by mouth daily as needed for anxiety.   4  . atorvastatin (LIPITOR) 40 MG tablet Take 40 mg by  mouth at bedtime.     Marland Kitchen azithromycin (ZITHROMAX) 250 MG tablet TAKE 1 TABLET BY MOUTH DAILY. START AFTER FINISHING LEVAQUIN 90 tablet 3  . benzonatate (TESSALON) 200 MG capsule Take 400 mg by mouth at bedtime.     . budesonide-formoterol (SYMBICORT) 160-4.5 MCG/ACT inhaler Inhale 2 puffs into the lungs 2 (two) times daily. 2 Inhaler 0  . buPROPion (WELLBUTRIN XL) 300 MG 24 hr tablet Take 300 mg by mouth daily.    . DULoxetine (CYMBALTA) 60 MG capsule Take 60 mg by mouth daily.     Marland Kitchen gabapentin (NEURONTIN) 600 MG tablet Take 600 mg by mouth 3 (three) times daily.     . Guaifenesin (MUCINEX MAXIMUM STRENGTH) 1200 MG TB12 Take 1,200 mg by mouth 2 (two) times daily as needed (for cough or congestion).     Marland Kitchen HYDROcodone-acetaminophen (NORCO/VICODIN) 5-325 MG per tablet Take 1  tablet by mouth See admin instructions. EVERY 4-6 HOURS AS NEEDED FOR PAIN OR SHORTNESS OF BREATH    . insulin aspart (NOVOLOG FLEXPEN) 100 UNIT/ML FlexPen Inject 5 Units into the skin 3 (three) times daily as needed for high blood sugar.    . insulin glargine (LANTUS) 100 UNIT/ML injection Inject 0.66 mLs (66 Units total) into the skin daily before breakfast. (Patient taking differently: Inject 64 Units into the skin daily before breakfast. )    . iron polysaccharides (NIFEREX) 150 MG capsule Take 1 capsule (150 mg total) by mouth 2 (two) times daily. (Patient taking differently: Take 150 mg by mouth daily. ) 60 capsule 0  . levalbuterol (XOPENEX HFA) 45 MCG/ACT inhaler Inhale 1-2 puffs into the lungs every 4 (four) hours as needed for wheezing. (Patient taking differently: Inhale 2 puffs into the lungs every 4 (four) hours as needed for wheezing or shortness of breath. ) 1 Inhaler 12  . metolazone (ZAROXOLYN) 5 MG tablet Take 5 mg by mouth 2 (two) times a week. Wednesday and Friday    . Multiple Vitamin (MULTIVITAMIN WITH MINERALS) TABS tablet Take 1 tablet by mouth daily.    Marland Kitchen omeprazole (PRILOSEC OTC) 20 MG tablet Take 20 mg by  mouth at bedtime.    . OXYGEN Inhale 5-8 L into the lungs See admin instructions. 5 liters when at rest and 8 liters when ambulatory    . polyethylene glycol (MIRALAX / GLYCOLAX) packet Take 17 g by mouth daily. Mix in 8 oz liquid and drink    . polyvinyl alcohol (ARTIFICIAL TEARS) 1.4 % ophthalmic solution Place 1 drop into both eyes 2 (two) times daily.     . potassium chloride (K-DUR,KLOR-CON) 10 MEQ tablet Take 3 tablets (30 mEq total) by mouth 2 (two) times daily. 180 tablet 5  . roflumilast (DALIRESP) 500 MCG TABS tablet Take 500 mcg by mouth daily.    . sodium chloride HYPERTONIC 3 % nebulizer solution Take by nebulization 2 (two) times daily. 750 mL 11  . SPIRIVA HANDIHALER 18 MCG inhalation capsule INHALE THE CONTENTS OF 1 CAPSULE VIA HANDIHALER BY MOUTH EVERY MORNING 30 capsule 3  . SYMBICORT 160-4.5 MCG/ACT inhaler INHALE 2 PUFFS BY MOUTH TWICE DAILY 10.2 g 5  . tobramycin, PF, (TOBI) 300 MG/5ML nebulizer solution Take 5 mLs (300 mg total) by nebulization 2 (two) times daily. Every other month DX: J44.9 300 mL 5  . verapamil (VERELAN PM) 360 MG 24 hr capsule Take 1 capsule (360 mg total) by mouth daily. 30 capsule 3  . XARELTO 20 MG TABS tablet Take 20 mg by mouth daily with supper.  6  . predniSONE (DELTASONE) 5 MG tablet TAKE 1 TABLET(5 MG) BY MOUTH DAILY WITH BREAKFAST (Patient not taking: Reported on 01/11/2018) 30 tablet 0  . torsemide (DEMADEX) 20 MG tablet Take 80 mg (4 tabs) in am and 40 mg (2 tabs) in pm 180 tablet 11   No current facility-administered medications for this encounter.    BP (!) 100/39   Pulse (!) 119   Wt 100.4 kg (221 lb 6.4 oz)   SpO2 93%   BMI 36.84 kg/m  General: NAD Neck: JVP 9-10 cm, no thyromegaly or thyroid nodule.  Lungs: Distant breath sounds CV: Nondisplaced PMI.  Heart mildly tachy, regular S1/S2, no S3/S4, no murmur.  1+ ankle edema.  No carotid bruit.  Normal pedal pulses.  Abdomen: Soft, nontender, no hepatosplenomegaly, no distention.    Skin: Intact without lesions or rashes.  Neurologic: Alert and oriented x 3.  Psych: Normal affect. Extremities: No clubbing or cyanosis.  HEENT: Normal.  .   Assessment/Plan: 1. COPD: On home oxygen, 5L at rest and 8L with exertion.  Severe COPD, Pseudomonas colonization.  Breathing has improved in the past with increased diuresis, suggesting that CHF plays a significant role in her symptoms in addition to COPD.  She has ILD on CT that may be due to chronic scarring from prior respiratory infections.   - Continue chronic prednisone 5 mg daily per pulmonary.  - She is on inhaled tobramycin for Pseudomonas colonization.  - She is on Daliresp.  2. Atrial fibrillation: Paroxysmal, CHADSVASC = 4.  NSR today (sinus tachy). - She will continue Xarelto.   - She can continue verapamil.     3. Chronic diastolic CHF: NYHA class IIIb symptoms, likely due to combination of CHF and COPD/ILD.  She has gained 11 lbs since last appointment and looks volume overloaded. - Stop Lasix.  Start torsemide 80 qam/40 qpm.  - Continue metolazone biw.  - BMET today and again in 10 days.   - I will arrange for repeat echo to assess RV . 4. ?Shunt lesion: She had transcranial dopplers in 2013 suggesting a medium-sized right to left shunt.  This led to an extensive workup.  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  Cardiac MRI was unremarkable, no evidence for shunt lesion.  RHC/LHC in 2013 showed no coronary disease but it did show restrictive hemodynamics and volume overload.  Qp/Qs from this study was 1.27/1, suggesting a relatively small left to right shunt.  A definite shunt lesion was never identified.  Based on prior workup, it seems most likely that the right to left shunting by transcranial dopplers and TEE bubble study was due to pulmonary AVMs.  RHC repeated in 2/17 did not show a significant shunt lesion, with Qp/Qs 0.97.  5. Inappropriate sinus tachycardia: Had discussed using  Corlanor in the past but interacts with verapamil.  6. Pulmonary hypertension: Mild pulmonary hypertension on RHC with PVR only 2.5 WU.  Probably due to COPD/ILD (group 3) and elevated left atrial pressure (group 2).  No specific treatment other than diuresis.  7. Interstitial lung disease: May be due to lung scarring from prior respiratory infections.  Followed by Dr. Lake Bells.  Dyspnea gradually worsening over time.  8. HTN: She has been on irbesartan but BP has actually been running low.   - Stop irbesartan.   Followup in 2 wks with APP.    Loralie Champagne 01/11/2018

## 2018-01-11 NOTE — Patient Instructions (Signed)
STOP Irbesartan (Avapro).  STOP Furosemide (Lasix).  START Torsemide 80 mg (4 tabs) in am and 40 mg (2 tabs) in pm.  Routine lab work today. Will notify you of abnormal results, otherwise no news is good news!  EKG today.  Follow up 2 weeks with Dr. Claris Gladden Nurse Practitioner, echocardiogram, and lab work.  __________________________________________________________ Destiny Shelton Code: 1500  Take all medication as prescribed the day of your appointment. Bring all medications with you to your appointment.  Do the following things EVERYDAY: 1) Weigh yourself in the morning before breakfast. Write it down and keep it in a log. 2) Take your medicines as prescribed 3) Eat low salt foods-Limit salt (sodium) to 2000 mg per day.  4) Stay as active as you can everyday 5) Limit all fluids for the day to less than 2 liters

## 2018-01-16 ENCOUNTER — Other Ambulatory Visit: Payer: Self-pay | Admitting: Pulmonary Disease

## 2018-01-19 ENCOUNTER — Other Ambulatory Visit (HOSPITAL_COMMUNITY): Payer: Self-pay

## 2018-01-19 MED ORDER — VERAPAMIL HCL ER 360 MG PO CP24: 360.0000 mg | ORAL_CAPSULE | Freq: Every day | ORAL | 3 refills | Status: DC

## 2018-01-20 ENCOUNTER — Ambulatory Visit (INDEPENDENT_AMBULATORY_CARE_PROVIDER_SITE_OTHER): Payer: Medicare Other | Admitting: Pulmonary Disease

## 2018-01-20 ENCOUNTER — Encounter: Payer: Self-pay | Admitting: Pulmonary Disease

## 2018-01-20 VITALS — BP 118/68 | HR 112 | Ht 62.5 in | Wt 218.0 lb

## 2018-01-20 DIAGNOSIS — J441 Chronic obstructive pulmonary disease with (acute) exacerbation: Secondary | ICD-10-CM

## 2018-01-20 DIAGNOSIS — Z2239 Carrier of other specified bacterial diseases: Secondary | ICD-10-CM | POA: Diagnosis not present

## 2018-01-20 DIAGNOSIS — J9611 Chronic respiratory failure with hypoxia: Secondary | ICD-10-CM | POA: Diagnosis not present

## 2018-01-20 DIAGNOSIS — I6529 Occlusion and stenosis of unspecified carotid artery: Secondary | ICD-10-CM | POA: Diagnosis not present

## 2018-01-20 DIAGNOSIS — I5032 Chronic diastolic (congestive) heart failure: Secondary | ICD-10-CM | POA: Diagnosis not present

## 2018-01-20 DIAGNOSIS — I272 Pulmonary hypertension, unspecified: Secondary | ICD-10-CM | POA: Diagnosis not present

## 2018-01-20 MED ORDER — TOBRAMYCIN 300 MG/5ML IN NEBU: 300.0000 mg | INHALATION_SOLUTION | Freq: Two times a day (BID) | RESPIRATORY_TRACT | 5 refills | Status: AC

## 2018-01-20 NOTE — Patient Instructions (Signed)
COPD: Continue Symbicort 2 puffs twice a day Continue Spiriva daily Get a high-dose flu shot when they are available Practice good hand hygiene Stay active Take azithromycin daily Take prednisone daily  Chronic Pseudomonas infection of the lungs: Start taking tobramycin every day for 1 month on, then 1 month off  Chronic respiratory failure with hypoxemia: Continue using 5-day liters of oxygen continuously  Pulmonary hypertension with leg swelling: Continue taking torsemide and metolazone as directed by Dr. Aundra Dubin  Constipation: In addition to MiraLAX, I recommend that you purchase senna over-the-counter to help with constipation  Follow-up with me in 6 to 8 weeks or sooner if needed

## 2018-01-20 NOTE — Progress Notes (Signed)
Subjective:    Patient ID: Destiny Shelton, female    DOB: 1946-03-26, 72 y.o.   MRN: [id number]  Synopsys: Former patient of Dr. Gwenette Shelton with pulmonary hypertension, Afib, and COPD.  She smoked 2 packs per day for 25 years and quit in the 1990's.  She has been on oxygen since 2012.  As of 2017 She has been using 5L O2 at rest and 6 L with exertion.   She does not have a history of PE that she is aware of.  A bronchoscopy in 2018 showed Pseudomonas.  She was started on daily azithromycin in 2018.  She was started on daily prednisone 5 mg daily in January 2019 due to persistent and recurrent exacerbations   HPI Chief Complaint  Patient presents with  . Follow-up    increased SOB, walking less, feet swelling   Destiny Shelton has been struggling recently.  She says that her shortness of breath is gotten quite severe and occurs whenever she just takes a few steps across her house.  She says that she is coughing up a bit more mucus.  She is using her oxygen continuously, today is at 6 L.  She says that she is taking the tobramycin every other day.  She uses prednisone daily, Symbicort twice a day, Spiriva daily, and azithromycin daily.  She says that she started taking the torsemide last week as prescribed by Dr. Aundra Shelton but her weight is only come down by couple pounds and her urine output has been sporadic.  Past Medical History:  Diagnosis Date  . Adenomatous colon polyp   . ALLERGIC RHINITIS   . Anemia    iron deficient  . Anxiety   . Atrial tachycardia (HCC)    Mostly Sinus Tachycardia  . Back pain, chronic   . Carotid stenosis   . CHF (congestive heart failure) ([place])   . Community acquired pneumonia - Recent Admission (07/2013) 07/08/2013  . COPD (chronic obstructive pulmonary disease) ([place])    Requiring Home O2 at 4L  . Depression   . Diabetes mellitus type 2 with neurological manifestations ([place])    On Insulin  . Diverticulosis of colon   . External hemorrhoids   . Fibromyalgia    "pain in  arms and shoulders."  . Gastritis   . GERD (gastroesophageal reflux disease)   . Headache(784.0)    tension  . Hyperlipemia   . Hypertension   . Inappropriate sinus tachycardia   . Morbidly obese ([place])   . Nephrolithiasis   . Neuromuscular disorder ([place])    diabetic neuropathy  . Osteoarthritis   . Osteoporosis   . PAF (paroxysmal atrial fibrillation) ([place])   . 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  . Sleep apnea    no cpap machine  02 at 4l/min all the time  . Urosepsis 05/26/2012      Review of Systems  Constitutional: Negative for chills, diaphoresis, fatigue and fever.  HENT: Negative for postnasal drip, rhinorrhea and sinus pressure.   Respiratory: Positive for cough, shortness of breath and wheezing.   Cardiovascular: Positive for leg swelling. Negative for chest pain and palpitations.       Objective:   Physical Exam Vitals:   01/20/18 1203  BP: 118/68  Pulse: (!) 112  SpO2: 90%  Weight: 218 lb (98.9 kg)  Height: 5' 2.5" (1.588 m)   8L [place]  Gen: chronically ill appearing HENT: OP clear, TM's clear, neck supple PULM: Wheezing, crackles bases B, normal percussion CV: RRR, no mgr, trace edema  GI: BS+, soft, nontender Derm: no cyanosis or rash Psyche: normal mood and affect      BMET    Component Value Date/Time   NA 138 01/11/2018 0949   K 3.5 01/11/2018 0949   CL 95 (L) 01/11/2018 0949   CO2 31 01/11/2018 0949   GLUCOSE 191 (H) 01/11/2018 0949   BUN 30 (H) 01/11/2018 0949   CREATININE 1.28 (H) 01/11/2018 0949   CREATININE 0.85 02/12/2015 0949   CALCIUM 8.8 (L) 01/11/2018 0949   GFRNONAA 40 (L) 01/11/2018 0949   GFRAA 47 (L) 01/11/2018 0949   Microbiology  BAL February 2018 Pseudomonas  PFT: PFT 2011:  FEV1 1.34 (55%), ratio 68, ++airtrapping on lung volumes, nl TLC, DLCO 40% pred. PFT's  03/23/2015  FEV1 1.61 (68 % ) ratio 69  p no % improvement from saba on   Symb/spiriva January 2018 pulmonary function testing ratio 70%, FEV1 1.63 L 70% predicted,  FVC 2.33 L 76% predicted, total lung capacity 4.48 L 86% predicted, DLCO 5.04 21 percent predicted January 2019 PFT: Ratio 66%, FEV1 1.50 L, 65%, FEV1 2.28 L 75% predicted, total lung capacity 5.0 L 96% predicted, DLCO 5.21 mL 20% predicted   Heart Cath: LHC 12/2011: non-obstructive cad RHC 12/2011:  PA 45/29 with mean 16m, PCWP 22, CO Fick 10.25, PVR 1.17 wood units, slight step up in saturation from   RV to PA 07/04/2015 RHC: RA mean 9 RV 45/10 PA 41/20, mean 30 PCWP mean 13  Oxygen saturations: SVC 68% RA 65% RV 61% PA 69% LA 98% Peripheral sat 93%  Cardiac Output (Fick) 6.7  Cardiac Index (Fick) 3.3 PVR 2.5 WU Qp/Qs = 0.97 .  Imaging: CT chest 12/2011:  No PE, very mild mosaic perfusion abnl (prob secondary to her known airflow obstruction).  January 2018 high-resolution CT scan of the chest shows air trapping, irregular distribution upper lobe fibrotic changes (interlobular septal thickening, some patchy ground glass) in a patchy distribution not consistent with usual interstitial pneumonitis  Cardiac imaging: Myoview 2012: no ischemia Echo 2012: nl LV and EF, impaired relaxation, nothing to suggest pulm htn. Echo 78/04/314  Nl LV, diastolic dysfxn, normal RV, estimated RVSP 46 TEE 03/2012:  A few late bubbles seen felt c/w small IP shunting.  Cardiac MRI 2013:  No infiltrative process.   Twelve-lead EKG: September 08, 2017 reviewed showing QTc interval of 4.65 ms  Records from her visit with cardiology last week reviewed where she was changed to torsemide for diuresis.     Assessment & Plan:    Chronic respiratory failure with hypoxia (HCC)  Chronic obstructive pulmonary disease with acute exacerbation (HCC)  COPD with acute exacerbation (HCC)  Chronic diastolic heart failure of unknown etiology ([place]  Pulmonary hypertension (HCC)  Pseudomonas aeruginosa colonization   Discussion: MCinnamonhas been struggling with worsening dyspnea.  There is many possible causes and clearly her  advanced lung disease is the primary cause, though she appears volume overloaded today.  Her weight is slowly coming down with the addition of torsemide, I have advised that if she does not see a continued decrease in weight and swelling that she needs to call Dr. MAundra Dubinto help adjust the medicine further.  She is been having a bit more constipation, I advised senna.  I told her today that she is really on a maximum medical regimen for all of her medical problems and that it would not be unreasonable for her to go on hospice if her symptoms worsen.  She  confirmed to me that she does not want to go on a ventilator or undergo CPR.  Plan: Advanced directives: She confirms today that her CODE STATUS is DNR and she does not want to go on a life support machine or have CPR  COPD: Continue Symbicort 2 puffs twice a day Continue Spiriva daily Get a high-dose flu shot when they are available Practice good hand hygiene Stay active Take azithromycin daily Take prednisone daily  Chronic Pseudomonas infection of the lungs: Start taking tobramycin every day for 1 month on, then 1 month off  Chronic respiratory failure with hypoxemia: Continue using 5-day liters of oxygen continuously  Pulmonary hypertension with leg swelling: Continue taking torsemide and metolazone as directed by Dr. Aundra Shelton  Constipation: In addition to MiraLAX, I recommend that you purchase senna over-the-counter to help with constipation  Follow-up with me in 6 to 8 weeks or sooner if needed   Current Outpatient Medications:  .  acetaminophen (TYLENOL) 500 MG tablet, Take 1 tablet (500 mg total) by mouth every 6 (six) hours as needed for mild pain or headache., Disp: 30 tablet, Rfl: 0 .  ALPRAZolam (XANAX) 0.5 MG tablet, Take 0.25-0.5 mg by mouth daily as needed for anxiety. , Disp: , Rfl: 4 .  atorvastatin (LIPITOR) 40 MG tablet, Take 40 mg by mouth at bedtime. , Disp: , Rfl:  .  azithromycin (ZITHROMAX) 250 MG tablet,  TAKE 1 TABLET BY MOUTH DAILY. START AFTER FINISHING LEVAQUIN, Disp: 90 tablet, Rfl: 3 .  benzonatate (TESSALON) 200 MG capsule, Take 400 mg by mouth at bedtime. , Disp: , Rfl:  .  budesonide-formoterol (SYMBICORT) 160-4.5 MCG/ACT inhaler, Inhale 2 puffs into the lungs 2 (two) times daily., Disp: 2 Inhaler, Rfl: 0 .  buPROPion (WELLBUTRIN XL) 300 MG 24 hr tablet, Take 300 mg by mouth daily., Disp: , Rfl:  .  DULoxetine (CYMBALTA) 60 MG capsule, Take 60 mg by mouth daily. , Disp: , Rfl:  .  gabapentin (NEURONTIN) 600 MG tablet, Take 600 mg by mouth 3 (three) times daily. , Disp: , Rfl:  .  Guaifenesin (MUCINEX MAXIMUM STRENGTH) 1200 MG TB12, Take 1,200 mg by mouth 2 (two) times daily as needed (for cough or congestion). , Disp: , Rfl:  .  HYDROcodone-acetaminophen (NORCO/VICODIN) 5-325 MG per tablet, Take 1 tablet by mouth See admin instructions. EVERY 4-6 HOURS AS NEEDED FOR PAIN OR SHORTNESS OF BREATH, Disp: , Rfl:  .  insulin aspart (NOVOLOG FLEXPEN) 100 UNIT/ML FlexPen, Inject 5 Units into the skin 3 (three) times daily as needed for high blood sugar., Disp: , Rfl:  .  insulin glargine (LANTUS) 100 UNIT/ML injection, Inject 0.66 mLs (66 Units total) into the skin daily before breakfast. (Patient taking differently: Inject 64 Units into the skin daily before breakfast. ), Disp: , Rfl:  .  iron polysaccharides (NIFEREX) 150 MG capsule, Take 1 capsule (150 mg total) by mouth 2 (two) times daily. (Patient taking differently: Take 150 mg by mouth daily. ), Disp: 60 capsule, Rfl: 0 .  levalbuterol (XOPENEX HFA) 45 MCG/ACT inhaler, Inhale 1-2 puffs into the lungs every 4 (four) hours as needed for wheezing. (Patient taking differently: Inhale 2 puffs into the lungs every 4 (four) hours as needed for wheezing or shortness of breath. ), Disp: 1 Inhaler, Rfl: 12 .  metolazone (ZAROXOLYN) 5 MG tablet, Take 5 mg by mouth 2 (two) times a week. Wednesday and Friday, Disp: , Rfl:  .  Multiple Vitamin (MULTIVITAMIN  WITH MINERALS) TABS  tablet, Take 1 tablet by mouth daily., Disp: , Rfl:  .  omeprazole (PRILOSEC OTC) 20 MG tablet, Take 20 mg by mouth at bedtime., Disp: , Rfl:  .  OXYGEN, Inhale 5-8 L into the lungs See admin instructions. 5 liters when at rest and 8 liters when ambulatory, Disp: , Rfl:  .  polyethylene glycol (MIRALAX / GLYCOLAX) packet, Take 17 g by mouth daily. Mix in 8 oz liquid and drink, Disp: , Rfl:  .  polyvinyl alcohol (ARTIFICIAL TEARS) 1.4 % ophthalmic solution, Place 1 drop into both eyes 2 (two) times daily. , Disp: , Rfl:  .  potassium chloride (K-DUR,KLOR-CON) 10 MEQ tablet, Take 3 tablets (30 mEq total) by mouth 2 (two) times daily., Disp: 180 tablet, Rfl: 5 .  predniSONE (DELTASONE) 5 MG tablet, TAKE 1 TABLET(5 MG) BY MOUTH DAILY WITH BREAKFAST, Disp: 30 tablet, Rfl: 0 .  roflumilast (DALIRESP) 500 MCG TABS tablet, Take 500 mcg by mouth daily., Disp: , Rfl:  .  sodium chloride HYPERTONIC 3 % nebulizer solution, Take by nebulization 2 (two) times daily., Disp: 750 mL, Rfl: 11 .  SPIRIVA HANDIHALER 18 MCG inhalation capsule, INHALE THE CONTENTS OF 1 CAPSULE VIA HANDIHALER BY MOUTH EVERY MORNING, Disp: 30 capsule, Rfl: 3 .  SYMBICORT 160-4.5 MCG/ACT inhaler, INHALE 2 PUFFS BY MOUTH TWICE DAILY, Disp: 10.2 g, Rfl: 5 .  torsemide (DEMADEX) 20 MG tablet, Take 80 mg (4 tabs) in am and 40 mg (2 tabs) in pm, Disp: 180 tablet, Rfl: 11 .  verapamil (VERELAN PM) 360 MG 24 hr capsule, Take 1 capsule (360 mg total) by mouth daily., Disp: 30 capsule, Rfl: 3 .  XARELTO 20 MG TABS tablet, Take 20 mg by mouth daily with supper., Disp: , Rfl: 6

## 2018-01-21 ENCOUNTER — Telehealth: Payer: Self-pay | Admitting: Pulmonary Disease

## 2018-01-21 NOTE — Telephone Encounter (Signed)
Called [facility], was placed on hold for over 7 minutes. Will call back.

## 2018-01-25 ENCOUNTER — Ambulatory Visit (HOSPITAL_COMMUNITY)
Admission: RE | Admit: 2018-01-25 | Discharge: 2018-01-25 | Disposition: A | Discharge status: Home / Self Care | Payer: Medicare Other | Source: Ambulatory Visit | Attending: Internal Medicine | Admitting: Internal Medicine

## 2018-01-25 ENCOUNTER — Ambulatory Visit (HOSPITAL_BASED_OUTPATIENT_CLINIC_OR_DEPARTMENT_OTHER)
Admission: RE | Admit: 2018-01-25 | Discharge: 2018-01-25 | Disposition: A | Discharge status: Home / Self Care | Payer: Medicare Other | Source: Ambulatory Visit | Attending: Cardiology | Admitting: Cardiology

## 2018-01-25 VITALS — BP 122/64 | HR 112 | Wt 218.0 lb

## 2018-01-25 DIAGNOSIS — I480 Paroxysmal atrial fibrillation: Secondary | ICD-10-CM | POA: Diagnosis not present

## 2018-01-25 DIAGNOSIS — E119 Type 2 diabetes mellitus without complications: Secondary | ICD-10-CM | POA: Insufficient documentation

## 2018-01-25 DIAGNOSIS — J441 Chronic obstructive pulmonary disease with (acute) exacerbation: Secondary | ICD-10-CM

## 2018-01-25 DIAGNOSIS — Z794 Long term (current) use of insulin: Secondary | ICD-10-CM | POA: Insufficient documentation

## 2018-01-25 DIAGNOSIS — I5032 Chronic diastolic (congestive) heart failure: Secondary | ICD-10-CM | POA: Diagnosis not present

## 2018-01-25 DIAGNOSIS — G4733 Obstructive sleep apnea (adult) (pediatric): Secondary | ICD-10-CM | POA: Insufficient documentation

## 2018-01-25 DIAGNOSIS — I272 Pulmonary hypertension, unspecified: Secondary | ICD-10-CM

## 2018-01-25 DIAGNOSIS — E669 Obesity, unspecified: Secondary | ICD-10-CM | POA: Diagnosis not present

## 2018-01-25 DIAGNOSIS — R0602 Shortness of breath: Secondary | ICD-10-CM | POA: Diagnosis not present

## 2018-01-25 DIAGNOSIS — Z2239 Carrier of other specified bacterial diseases: Secondary | ICD-10-CM | POA: Diagnosis not present

## 2018-01-25 DIAGNOSIS — J849 Interstitial pulmonary disease, unspecified: Secondary | ICD-10-CM

## 2018-01-25 DIAGNOSIS — Z7952 Long term (current) use of systemic steroids: Secondary | ICD-10-CM | POA: Insufficient documentation

## 2018-01-25 DIAGNOSIS — R000 Tachycardia, unspecified: Secondary | ICD-10-CM

## 2018-01-25 DIAGNOSIS — Z79899 Other long term (current) drug therapy: Secondary | ICD-10-CM | POA: Diagnosis not present

## 2018-01-25 DIAGNOSIS — Z87891 Personal history of nicotine dependence: Secondary | ICD-10-CM | POA: Diagnosis not present

## 2018-01-25 DIAGNOSIS — J449 Chronic obstructive pulmonary disease, unspecified: Secondary | ICD-10-CM | POA: Diagnosis not present

## 2018-01-25 DIAGNOSIS — Z7951 Long term (current) use of inhaled steroids: Secondary | ICD-10-CM | POA: Insufficient documentation

## 2018-01-25 DIAGNOSIS — I110 Hypertensive heart disease with heart failure: Secondary | ICD-10-CM | POA: Diagnosis not present

## 2018-01-25 DIAGNOSIS — Z9981 Dependence on supplemental oxygen: Secondary | ICD-10-CM | POA: Insufficient documentation

## 2018-01-25 DIAGNOSIS — Z7901 Long term (current) use of anticoagulants: Secondary | ICD-10-CM | POA: Diagnosis not present

## 2018-01-25 DIAGNOSIS — I10 Essential (primary) hypertension: Secondary | ICD-10-CM | POA: Diagnosis not present

## 2018-01-25 LAB — BASIC METABOLIC PANEL
ANION GAP: 13 (ref 5–15)
BUN: 12 mg/dL (ref 8–23)
CO2: 30 mmol/L (ref 22–32)
Calcium: 9 mg/dL (ref 8.9–10.3)
Chloride: 101 mmol/L (ref 98–111)
Creatinine, Ser: 0.94 mg/dL (ref 0.44–1.00)
GFR, EST NON AFRICAN AMERICAN: 59 mL/min — AB (ref >60)
Glucose, Bld: 145 mg/dL — ABNORMAL HIGH (ref 70–99)
POTASSIUM: 4.6 mmol/L (ref 3.5–5.1)
Sodium: 144 mmol/L (ref 135–145)

## 2018-01-25 NOTE — Telephone Encounter (Signed)
Pt calling to check on the status of med refill she can be reached @ [phone].Destiny Shelton

## 2018-01-25 NOTE — Telephone Encounter (Signed)
Checked CoverMyMeds, PA is still pending and a determination should be made within the next 72 hours.

## 2018-01-25 NOTE — Telephone Encounter (Signed)
Spoke with Sunday Spillers at [employer]. She stated that the J44.9 code will work for the patient. She stated that she needs to have a PA done for the Tobramycin.  Per patient's chart, Tobramycin was prescribed for pseudomonas aeruginosa colonization, Z22.39.   Will attempt PA on CoverMyMeds. Key for PA is [id number].  Will check on status later on today.

## 2018-01-25 NOTE — Patient Instructions (Addendum)
Routine lab work today. Will notify you of abnormal results, otherwise no news is good news!  No changes to medication at this time.  Follow up 4-6 weeks with Dr. Aundra Dubin.  _____________________________________________________________________ Destiny Shelton Code:  Take all medication as prescribed the day of your appointment. Bring all medications with you to your appointment.  Do the following things EVERYDAY: 1) Weigh yourself in the morning before breakfast. Write it down and keep it in a log. 2) Take your medicines as prescribed 3) Eat low salt foods-Limit salt (sodium) to 2000 mg per day.  4) Stay as active as you can everyday 5) Limit all fluids for the day to less than 2 liters

## 2018-01-25 NOTE — Progress Notes (Signed)
Patient ID: Destiny Shelton, female   DOB: 1945-08-14, 72 y.o.   MRN: [id number]    Advanced Heart Failure Clinic Note  PCP: Dr. Brigitte Pulse HF Cardiology: Dr. Keturah Shavers is a 72 y.o. female with history of paroxysmal atrial fibrillation, chronic diastolic CHF with restrictive hemodynamics on 2013 RHC, COPD on home oxygen, inappropriate sinus tachycardia, and concern for intracardiac shunting presents for followup of diastolic CHF.  She has been followed for several years by both cardiology and pulmonology.  She is a prior smoker and has been diagnosed with COPD.  Interestingly, her PFTs in 10/16 showed improvement, suggesting mild obstructive airways disease.  However, she still requires home oxygen.  She had transcranial dopplers in 2013 suggesting a medium-sized right to left shunt.  This led to an extensive workup.  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  Cardiac MRI was unremarkable, no evidence for shunt lesion.  RHC/LHC showed no coronary disease but it did show restrictive hemodynamics and volume overload.  Qp/Qs from this study was 1.27/1, suggesting a relatively small left to right shunt.  A definite shunt lesion was never identified.    Echo in 2/17 showed normal LV size and systolic function and normal RV.  I also did a RHC in 2/17: on higher dose diuretics, she had mild pulmonary hypertension and normal PCWP.  There was no evidence for a shunt lesion with Qp/Qs 0.97.   She was admitted in 9/17 with acute exacerbation of COPD.  She was treated with steroids and nebs.  She was admitted in 12/17 with suspected COPD exacerbation, treated with steroids and levofloxacin.  Lasix was decreased to 80 mg once daily but I increased it back to 80 qam/40 qpm when I saw her in followup, later up to Lasix 80 mg bid.  Subsequently, metolazone was started twice a week.   In 8/18, she was admitted with PNA and severe epistaxis.  She was in the ER again with epistaxis in 10/18.   She subsequently saw an ENT with cautery done in her nose.  She has had only 1 episode of epistaxis since then and is on Xarelto still.    She presents today for 2 week follow up with Echo. Last visit switched to torsemide. She is down 3 lbs and feeling slightly better. She continues to take metolazone as needed. Taken last Friday, 01/22/18.She denies lightheadedness or dizziness. Using 5 L O2 at rest and 8L with exertion. Was only using 5 on ambulation into clinic and de-sats. She remains on inhaled tobramycin with chronic pseudomonas colonization. She is on Daliresp. Initially felt like more volume was coming off on torsemide, but has since tapered off.   Echo today shows LVEF 55%, MIldly D-shaped interventricular septum suggestive of RV pressure/volume overload, Trivial MR, Mild LAE, Mildly dilated RV, normal function. CVP ~ 8.   Labs (9/16): digoxin 0.6, K 4.3, creatinine 0.85 Labs (1/17): K 3.9, creatinine 0.96, hgb 8.9 Labs (3/17): K 4.2, creatinine 0.96 Labs (6/17): K 4.2, creatinine 1.03 Labs (8/17): K 4.1, creatinine 0.81, HCT 34.3 Labs (9/17): K 3.7, creatinine 0.91, hgb 12.1 Labs (12/17): K 3.4, creatinine 0.84, hgb 13 Labs (1/18): K 4.2, creatinine 1.27 Labs (6/18): pro-BNP 42 Labs (7/18): K 4.5, creatinine 0.93 Labs (10/18): K 3.8, creatinine 1.38, hgb 8.1 Labs (12/18): K 4.4, creatinine 1.11, hgb 8.4 Labs (4/19): K 4.2, creatinine 0.92, hgb 10.6  ECG (personally reviewed): Sinus tachycardia, o/w normal  PMH: 1. Atrial fibrillation: Paroxysmal.  2.  HTN 3. COPD: Home oxygen.  PFTs (10/16) with FVC 77%, FEV1 68%, TLC 89%.  Now severe.  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  4. Type II diabetes 5. Obesity 6. Inappropriate sinus tachycardia 7. OSA: Does not use CPAP, uses oxygen at night.  8. Chronic diastolic CHF: RHC/LHC in 0/93 with no significant CAD, mean RA 19, RV 46/18, PA 45/29 mean 37, mean PCWP 22, CI 4.1, PVR 1.4 WU => restrictive hemodynamics with no evidence for  constriction;  Qp/Qs 1.27/1; saturation run difficult to interpret.  TEE (10/13): EF 55-60%, LVH, mild MR, bubble study showed no evidence for immediate R=>L bubble crossing, small amount of bubbles ended up in left heart > 5 seconds after injection suggesting possible intrapulmonary shunting; no shunt by color doppler.  Cardiac MRI (8/13) with EF 59%, no LGE, normal RV size/systolic function => interpreted as normal.  RHC 07/04/2015: RA mean 9, RV 45/10, PA 41/20 mean 30, PCWP mean 13, no step up on shunt run,cardiac output (Fick) 6.7 cardiac index (Fick) 3.3, PVR 2.5 WU, Qp/Qs = 0.97. Echo (2/17) with EF 55-60%, mild LVH, mild MR, normal RV size and systolic function.  9. ?Intracardiac shunt: Transcranial dopplers in 10/13 were suggestive of medium-sized right to left shunt.  Bubble study on TEE in 10/13 showed no evidence for immediate R=>L bubble crossing, small amount of bubbles ended up in left heart > 5 seconds after injection suggesting possible intrapulmonary shunting; no shunt by color doppler. Cardiac MRI showed no evidence for shunting.  Cardiac cath in 2013 actually was suggestive of a small left to right shunt.  Cardiac cath in 2/17 NOT suggestive of significant shunt, Qp/Qs 0.97.  10. Interstitial lung disease: HRCT in 1/18 was concerning for ILD, possibly scarring from prior respiratory infections.   SH: Married, prior heavy smoker (quit 1996).  No ETOH.   Family History  Problem Relation Age of Onset  . Heart disease Mother   . Stroke Mother   . Heart disease Father   . Heart disease Brother   . Stroke Brother   . Skin cancer Brother   . Colon polyps Sister   . Diabetes Sister   . Diabetes Brother   . Irritable bowel syndrome Daughter   . Colon cancer Neg Hx    Review of systems complete and found to be negative unless listed in HPI.    Current Outpatient Medications  Medication Sig Dispense Refill  . acetaminophen (TYLENOL) 500 MG tablet Take 1 tablet (500 mg total) by mouth  every 6 (six) hours as needed for mild pain or headache. 30 tablet 0  . ALPRAZolam (XANAX) 0.5 MG tablet Take 0.25-0.5 mg by mouth daily as needed for anxiety.   4  . atorvastatin (LIPITOR) 40 MG tablet Take 40 mg by mouth at bedtime.     Marland Kitchen azithromycin (ZITHROMAX) 250 MG tablet TAKE 1 TABLET BY MOUTH DAILY. START AFTER FINISHING LEVAQUIN 90 tablet 3  . benzonatate (TESSALON) 200 MG capsule Take 400 mg by mouth at bedtime.     . budesonide-formoterol (SYMBICORT) 160-4.5 MCG/ACT inhaler Inhale 2 puffs into the lungs 2 (two) times daily. 2 Inhaler 0  . buPROPion (WELLBUTRIN XL) 300 MG 24 hr tablet Take 300 mg by mouth daily.    . DULoxetine (CYMBALTA) 60 MG capsule Take 60 mg by mouth daily.     Marland Kitchen gabapentin (NEURONTIN) 600 MG tablet Take 600 mg by mouth 3 (three) times daily.     . Guaifenesin (Gates) 1200  MG TB12 Take 1,200 mg by mouth 2 (two) times daily as needed (for cough or congestion).     Marland Kitchen HYDROcodone-acetaminophen (NORCO/VICODIN) 5-325 MG per tablet Take 1 tablet by mouth See admin instructions. EVERY 4-6 HOURS AS NEEDED FOR PAIN OR SHORTNESS OF BREATH    . insulin aspart (NOVOLOG FLEXPEN) 100 UNIT/ML FlexPen Inject 5 Units into the skin 3 (three) times daily as needed for high blood sugar.    . insulin glargine (LANTUS) 100 UNIT/ML injection Inject 0.66 mLs (66 Units total) into the skin daily before breakfast. (Patient taking differently: Inject 64 Units into the skin daily before breakfast. )    . iron polysaccharides (NIFEREX) 150 MG capsule Take 1 capsule (150 mg total) by mouth 2 (two) times daily. (Patient taking differently: Take 150 mg by mouth daily. ) 60 capsule 0  . levalbuterol (XOPENEX HFA) 45 MCG/ACT inhaler Inhale 1-2 puffs into the lungs every 4 (four) hours as needed for wheezing. (Patient taking differently: Inhale 2 puffs into the lungs every 4 (four) hours as needed for wheezing or shortness of breath. ) 1 Inhaler 12  . metolazone (ZAROXOLYN) 5 MG  tablet Take 5 mg by mouth daily as needed.    . Multiple Vitamin (MULTIVITAMIN WITH MINERALS) TABS tablet Take 1 tablet by mouth daily.    Marland Kitchen omeprazole (PRILOSEC OTC) 20 MG tablet Take 20 mg by mouth at bedtime.    . OXYGEN Inhale 5-8 L into the lungs See admin instructions. 5 liters when at rest and 8 liters when ambulatory    . polyethylene glycol (MIRALAX / GLYCOLAX) packet Take 17 g by mouth daily. Mix in 8 oz liquid and drink    . polyvinyl alcohol (ARTIFICIAL TEARS) 1.4 % ophthalmic solution Place 1 drop into both eyes 2 (two) times daily.     . potassium chloride (K-DUR,KLOR-CON) 10 MEQ tablet Take 3 tablets (30 mEq total) by mouth 2 (two) times daily. 180 tablet 5  . predniSONE (DELTASONE) 5 MG tablet TAKE 1 TABLET(5 MG) BY MOUTH DAILY WITH BREAKFAST 30 tablet 0  . roflumilast (DALIRESP) 500 MCG TABS tablet Take 500 mcg by mouth daily.    . sodium chloride HYPERTONIC 3 % nebulizer solution Take by nebulization 2 (two) times daily. 750 mL 11  . SPIRIVA HANDIHALER 18 MCG inhalation capsule INHALE THE CONTENTS OF 1 CAPSULE VIA HANDIHALER BY MOUTH EVERY MORNING 30 capsule 3  . SYMBICORT 160-4.5 MCG/ACT inhaler INHALE 2 PUFFS BY MOUTH TWICE DAILY 10.2 g 5  . tobramycin, PF, (TOBI) 300 MG/5ML nebulizer solution Take 5 mLs (300 mg total) by nebulization 2 (two) times daily. Every other month DX: J44.9 300 mL 5  . torsemide (DEMADEX) 20 MG tablet Take 80 mg (4 tabs) in am and 40 mg (2 tabs) in pm 180 tablet 11  . verapamil (VERELAN PM) 360 MG 24 hr capsule Take 1 capsule (360 mg total) by mouth daily. 30 capsule 3  . XARELTO 20 MG TABS tablet Take 20 mg by mouth daily with supper.  6   No current facility-administered medications for this encounter.    Vitals:   01/25/18 1407  BP: 122/64  Pulse: (!) 112  SpO2: 90%  Weight: 98.9 kg (218 lb)    Wt Readings from Last 3 Encounters:  01/25/18 98.9 kg (218 lb)  01/20/18 98.9 kg (218 lb)  01/11/18 100.4 kg (221 lb 6.4 oz)    General:  Chronically ill appearing. [place] via O2.  HEENT: Normal Neck: Supple. JVP  7-8 cm. Carotids 2+ bilat; no bruits. No thyromegaly or nodule noted. Cor: PMI nondisplaced. RRR, No M/G/R noted Lungs: CTAB, normal effort. Abdomen: Soft, non-tender, non-distended, no HSM. No bruits or masses. +BS  Extremities: No cyanosis, clubbing, or rash. R and LLE no edema.  Neuro: Alert & orientedx3, cranial nerves grossly intact. moves all 4 extremities w/o difficulty. Affect pleasant   Assessment/Plan: 1. COPD:  - On home oxygen, 5L at rest and 8L with exertion.  - Severe COPD, Pseudomonas colonization.  Breathing has improved in the past with increased diuresis, suggesting that CHF plays a significant role in her symptoms in addition to COPD.  She has ILD on CT that may be due to chronic scarring from prior respiratory infections.   - Follows with pulmonary.  - Continue chronic prednisone 5 mg daily per pulmonary.  - She is on inhaled tobramycin for Pseudomonas colonization.  - She is on Daliresp.  2. Atrial fibrillation: Paroxysmal, CHADSVASC = 4.   - Regular on exam.  - She will continue Xarelto.   - She can continue verapamil.     3. Chronic diastolic CHF:  - Echo today shows LVEF 55%, MIldly D-shaped interventricular septum suggestive of RV pressure/volume overload, Trivial MR, Mild LAE, Mildly dilated RV, normal function. CVP ~ 8.  - NYHA III-IIIb chronically with severe lung disease. - Volume status looks OK to mild volume overload.   - Continue torsemide 80 mg qam/40 q pm.  - She has only been taking metolazone as needed. Encouraged to take 1-2 times weekly.  - BMET today.  - Reinforced fluid restriction to < 2 L daily, sodium restriction to less than 2000 mg daily, and the importance of daily weights.   4. ?Shunt lesion:  - She had transcranial dopplers in 2013 suggesting a medium-sized right to left shunt.  This led to an extensive workup.  TEE in 2013 showed late bubbles, suggestive of possible  pulmonary AVMs.   - Cardiac MRI was unremarkable, no evidence for shunt lesion.  RHC/LHC in 2013 showed no coronary disease but it did show restrictive hemodynamics and volume overload.  Qp/Qs from this study was 1.27/1, suggesting a relatively small left to right shunt. A definite shunt lesion was never identified.   - Based on prior workup, it seems most likely that the right to left shunting by transcranial dopplers and TEE bubble study was due to pulmonary AVMs.  RHC repeated in 2/17 did not show a significant shunt lesion, with Qp/Qs 0.97.  5. Inappropriate sinus tachycardia:  - Had discussed using Corlanor in the past but interacts with verapamil. No change.  6. Pulmonary hypertension:  - Mild pulmonary hypertension on RHC with PVR only 2.5 WU.  Probably due to COPD/ILD (group 3) and elevated left atrial pressure (group 2).  - No specific treatment other than diuresis.  7. Interstitial lung disease:  - May be due to lung scarring from prior respiratory infections.  Followed by Dr. Lake Bells.  Dyspnea gradually worsening over time. No change.  8. HTN:  - Off irbesartan with low BP.   Doing slightly better on torsemide. RV looks OK on Echo today. Discussed with Dr. Aundra Dubin. Continued to encourage sliding scale diuretics and fluid restriction. RTC 4-6 weeks with  Dr. Leo Rod, PA-C  01/25/2018  Greater than 50% of the 25 minute visit was spent in counseling/coordination of care regarding disease state education, salt/fluid restriction, sliding scale diuretics, and medication compliance.

## 2018-01-25 NOTE — Telephone Encounter (Signed)
Called patient unable to reach left message to give us a call back.

## 2018-01-25 NOTE — Progress Notes (Signed)
Echocardiogram 2D Echocardiogram has been performed.  Matilde Bash 01/25/2018, 2:10 PM

## 2018-01-27 NOTE — Telephone Encounter (Signed)
Checked Cover My Meds. A determination still has not been made per Cover My Meds. I have contacted [facility] at [phone]. States that they need additional information from [place]. This information has been given. PA has been sent for clinical review. Will await PA decision.

## 2018-01-29 NOTE — Telephone Encounter (Signed)
Called [employer] [facility]. Still awaiting decision. Decision will be faxed. Will hold until hfax has been received.

## 2018-01-29 NOTE — Telephone Encounter (Signed)
Per [email or web address], PA has been approved until 06/01/2018.   [facility] is aware of approval. Left message for patient to call back.

## 2018-02-02 ENCOUNTER — Other Ambulatory Visit (HOSPITAL_COMMUNITY): Payer: Self-pay

## 2018-02-02 MED ORDER — POTASSIUM CHLORIDE CRYS ER 10 MEQ PO TBCR: 30.0000 meq | EXTENDED_RELEASE_TABLET | Freq: Two times a day (BID) | ORAL | 5 refills | Status: DC

## 2018-02-02 NOTE — Telephone Encounter (Signed)
Pt is calling back [phone]

## 2018-02-02 NOTE — Telephone Encounter (Signed)
Called and spoke to patient. Let patient know that medication has been approved. Patient stated that she also received a letter of approval. Patient stated that she will contact the pharmacy and get started. Nothing further needed at this time.

## 2018-02-05 ENCOUNTER — Other Ambulatory Visit: Payer: Self-pay | Admitting: Acute Care

## 2018-02-08 ENCOUNTER — Telehealth: Payer: Self-pay | Admitting: Pulmonary Disease

## 2018-02-08 NOTE — Telephone Encounter (Signed)
Received Electronic refill for albuterol neb , per 01/20/18 medication was discontinued by staff at [facility] with reason listed as "discontinued by provider" but no mention in office visit note.   Patient recently started on tobi.   Dr. Lake Bells please advise if patient needs refill on albuterol neb.  Thank you Ashley Akin RN

## 2018-02-08 NOTE — Telephone Encounter (Signed)
[facility] [address] of [address] [place]

## 2018-02-08 NOTE — Telephone Encounter (Signed)
Called patients pharmacy was placed on hold for over 7 minutes. Hit option 3 and left a voice mail to give [place] a call back. Called patient unable to reach left message to give [place] a call back.

## 2018-02-09 NOTE — Telephone Encounter (Signed)
Called patients pharmacy, they are not open until 9am. Will leave in triage to call back.

## 2018-02-10 NOTE — Telephone Encounter (Signed)
Called and spoke with patients pharmacy. Medication has been approved they are now waiting to get it. Patient is aware. Nothing further needed.

## 2018-02-11 ENCOUNTER — Telehealth: Payer: Self-pay | Admitting: Pulmonary Disease

## 2018-02-11 NOTE — Telephone Encounter (Signed)
Spoke with patient. She stated that she received her tobramycin medication yesterday but it did not come with instructions. Advised patient that per BQ's last OV he wanted to use the medication twice daily for one month and then stay off of the medication for following month. She verbalized understanding. She stated that she will start the medication today.   Nothing further needed at time of call.

## 2018-02-13 ENCOUNTER — Other Ambulatory Visit: Payer: Self-pay | Admitting: Pulmonary Disease

## 2018-02-22 ENCOUNTER — Other Ambulatory Visit: Payer: Self-pay | Admitting: Pulmonary Disease

## 2018-03-01 DIAGNOSIS — M810 Age-related osteoporosis without current pathological fracture: Secondary | ICD-10-CM | POA: Diagnosis not present

## 2018-03-04 ENCOUNTER — Ambulatory Visit (INDEPENDENT_AMBULATORY_CARE_PROVIDER_SITE_OTHER): Payer: Medicare Other | Admitting: Pulmonary Disease

## 2018-03-04 ENCOUNTER — Encounter: Payer: Self-pay | Admitting: Pulmonary Disease

## 2018-03-04 ENCOUNTER — Other Ambulatory Visit (INDEPENDENT_AMBULATORY_CARE_PROVIDER_SITE_OTHER): Payer: Medicare Other

## 2018-03-04 VITALS — BP 134/82 | HR 122 | Ht 60.0 in | Wt 214.6 lb

## 2018-03-04 DIAGNOSIS — I272 Pulmonary hypertension, unspecified: Secondary | ICD-10-CM

## 2018-03-04 DIAGNOSIS — Z2239 Carrier of other specified bacterial diseases: Secondary | ICD-10-CM

## 2018-03-04 DIAGNOSIS — I6529 Occlusion and stenosis of unspecified carotid artery: Secondary | ICD-10-CM | POA: Diagnosis not present

## 2018-03-04 DIAGNOSIS — M549 Dorsalgia, unspecified: Secondary | ICD-10-CM | POA: Diagnosis not present

## 2018-03-04 DIAGNOSIS — J9611 Chronic respiratory failure with hypoxia: Secondary | ICD-10-CM | POA: Diagnosis not present

## 2018-03-04 DIAGNOSIS — J441 Chronic obstructive pulmonary disease with (acute) exacerbation: Secondary | ICD-10-CM | POA: Diagnosis not present

## 2018-03-04 DIAGNOSIS — J849 Interstitial pulmonary disease, unspecified: Secondary | ICD-10-CM | POA: Diagnosis not present

## 2018-03-04 DIAGNOSIS — Z23 Encounter for immunization: Secondary | ICD-10-CM

## 2018-03-04 LAB — URINALYSIS, ROUTINE W REFLEX MICROSCOPIC
Bacteria, UA: NONE SEEN
Bilirubin Urine: NEGATIVE
Hgb urine dipstick: NEGATIVE
KETONES UR: NEGATIVE
Nitrite: NEGATIVE
RBC / HPF: NONE SEEN (ref 0–?)
SPECIFIC GRAVITY, URINE: 1.02 (ref 1.000–1.030)
Total Protein, Urine: NEGATIVE
UROBILINOGEN UA: 0.2 (ref 0.0–1.0)
Urine Glucose: NEGATIVE
pH: 6 (ref 5.0–8.0)

## 2018-03-04 MED ORDER — BUDESONIDE 0.5 MG/2ML IN SUSP: 0.5000 mg | Freq: Two times a day (BID) | RESPIRATORY_TRACT | 12 refills | Status: DC

## 2018-03-04 MED ORDER — ALBUTEROL SULFATE (2.5 MG/3ML) 0.083% IN NEBU: INHALATION_SOLUTION | RESPIRATORY_TRACT | 5 refills | Status: DC

## 2018-03-04 MED ORDER — ARFORMOTEROL TARTRATE 15 MCG/2ML IN NEBU: 15.0000 ug | INHALATION_SOLUTION | Freq: Two times a day (BID) | RESPIRATORY_TRACT | 11 refills | Status: DC

## 2018-03-04 NOTE — Patient Instructions (Addendum)
Severe COPD with recurrent exacerbations: Continue prednisone Continue daily azithromycin Stop Symbicort Start taking Brovana and Pulmicort twice a day, these prescriptions will come from a DME company or specialty pharmacy.  If you have questions about cost or access to this medicine please do not hesitate to call us back to clarify. You can resume taking albuterol twice a day, even when you are taking inhaled tobramycin High dose flu shot today  Chronic respiratory failure with hypoxemia: Keep using 5 to 8 L of oxygen continuously  Chronic Pseudomonas colonization with recurrent exacerbation: Continue daily azithromycin Tobramycin is meant to be taken twice a day every other month nebulized  Pulmonary hypertension: Keep your appointments with the advanced heart failure clinic Keep taking diuretics as directed by the advanced heart failure clinic  Back pain: concern for a kidney infection Urinalysis  We will see you back in 4 weeks with a nurse practitioner for a med calendar visit, bring all medications to that visit

## 2018-03-04 NOTE — Progress Notes (Signed)
Subjective:    Patient ID: Destiny Shelton, female    DOB: August 08, 1945, 72 y.o.   MRN: [id number]  Synopsys: Former patient of Dr. Gwenette Greet with pulmonary hypertension, Afib, and COPD.  She smoked 2 packs per day for 25 years and quit in the 1990's.  She has been on oxygen since 2012.  As of 2017 She has been using 5L O2 at rest and 6 L with exertion.   She does not have a history of PE that she is aware of.  A bronchoscopy in 2018 showed Pseudomonas.  She was started on daily azithromycin in 2018.  She was started on daily prednisone 5 mg daily in January 2019 due to persistent and recurrent exacerbations   HPI Chief Complaint  Patient presents with  . Follow-up    Feeling more SOB. no significant cough but just feels she "can't breather."   [facility]  She says that her breathing is not much better than it was the last time.  She has finished taking the tobramycin for the first month as of yesterday.  She says she still coughs up mucus.  She has not been treated for a bronchitis exacerbation since the last visit though she did receive a course of antibiotics for "back pain".  Apparently her primary care physician was treating her for presumed kidney infection.  She says that she continues to have this low back pain which is migrated from the right side to the left off and on.  She continues to use and benefit from her oxygen.  She says that as long as she is sitting still she does not feel short of breath at any time she gets up and moves around she feels significantly short of breath.  She continues to take her Symbicort and Spiriva.  Past Medical History:  Diagnosis Date  . Adenomatous colon polyp   . ALLERGIC RHINITIS   . Anemia    iron deficient  . Anxiety   . Atrial tachycardia (HCC)    Mostly Sinus Tachycardia  . Back pain, chronic   . Carotid stenosis   . CHF (congestive heart failure) ([facility]   . Community acquired pneumonia - Recent Admission  (07/2013) 07/08/2013  . COPD (chronic obstructive pulmonary disease) ([place]    Requiring Home O2 at 4L  . Depression   . Diabetes mellitus type 2 with neurological manifestations ([facility]    On Insulin  . Diverticulosis of colon   . External hemorrhoids   . Fibromyalgia    "pain in arms and shoulders."  . Gastritis   . GERD (gastroesophageal reflux disease)   . Headache(784.0)    tension  . Hyperlipemia   . Hypertension   . Inappropriate sinus tachycardia   . Morbidly obese ([place]   . Nephrolithiasis   . Neuromuscular disorder ([place]    diabetic neuropathy  . Osteoarthritis   . Osteoporosis   . PAF (paroxysmal atrial fibrillation) ([place]   . 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  . Sleep apnea    no cpap machine  02 at 4l/min all the time  . Urosepsis 05/26/2012      Review of Systems  Constitutional: Negative for chills, diaphoresis, fatigue and fever.  HENT: Negative for postnasal drip, rhinorrhea and sinus pressure.   Respiratory: Positive for cough, shortness of breath and wheezing.   Cardiovascular: Positive for leg swelling. Negative for chest pain and palpitations.       Objective:   Physical Exam Vitals:  03/04/18 1006 03/04/18 1012  BP: 134/82   Pulse: (!) 122   SpO2: (!) 72% (S) 91%  Weight: 214 lb 9.6 oz (97.3 kg)   Height: 5' (1.524 m)    After resting her O2 saturation returned to 91% RA  8L [place]  Gen: chronically ill  Appearing, in wheelchair HENT: OP clear, TM's clear, neck supple PULM: CTA Today, no wheezing, normal percussion CV: RRR, no mgr, trace edema GI: BS+, soft, nontender Derm: no cyanosis or rash Psyche: normal mood and affect    BMET    Component Value Date/Time   NA 144 01/25/2018 1430   K 4.6 01/25/2018 1430   CL 101 01/25/2018 1430   CO2 30 01/25/2018 1430   GLUCOSE 145 (H) 01/25/2018 1430   BUN 12 01/25/2018 1430   CREATININE 0.94 01/25/2018 1430   CREATININE 0.85 02/12/2015 0949   CALCIUM 9.0 01/25/2018 1430   GFRNONAA 59 (L)  01/25/2018 1430   GFRAA >60 01/25/2018 1430   Microbiology  BAL February 2018 Pseudomonas  PFT: PFT 2011:  FEV1 1.34 (55%), ratio 68, ++airtrapping on lung volumes, nl TLC, DLCO 40% pred. PFT's  03/23/2015  FEV1 1.61 (68 % ) ratio 69  p no % improvement from saba on   Symb/spiriva January 2018 pulmonary function testing ratio 70%, FEV1 1.63 L 70% predicted, FVC 2.33 L 76% predicted, total lung capacity 4.48 L 86% predicted, DLCO 5.04 21 percent predicted January 2019 PFT: Ratio 66%, FEV1 1.50 L, 65%, FEV1 2.28 L 75% predicted, total lung capacity 5.0 L 96% predicted, DLCO 5.21 mL 20% predicted  Echo: 12/2017 TTE: LVEF 55%, no LVH, mild RV dilation, no PA pressure estimate  Heart Cath: LHC 12/2011: non-obstructive cad RHC 12/2011:  PA 45/29 with mean 89m, PCWP 22, CO Fick 10.25, PVR 1.17 wood units, slight step up in saturation from   RV to PA 07/04/2015 RHC: RA mean 9 RV 45/10 PA 41/20, mean 30 PCWP mean 13  Oxygen saturations: SVC 68% RA 65% RV 61% PA 69% LA 98% Peripheral sat 93%  Cardiac Output (Fick) 6.7  Cardiac Index (Fick) 3.3 PVR 2.5 WU Qp/Qs = 0.97 .  Imaging: CT chest 12/2011:  No PE, very mild mosaic perfusion abnl (prob secondary to her known airflow obstruction).  January 2018 high-resolution CT scan of the chest shows air trapping, irregular distribution upper lobe fibrotic changes (interlobular septal thickening, some patchy ground glass) in a patchy distribution not consistent with usual interstitial pneumonitis  Cardiac imaging: Myoview 2012: no ischemia Echo 2012: nl LV and EF, impaired relaxation, nothing to suggest pulm htn. Echo 76/38/1771  Nl LV, diastolic dysfxn, normal RV, estimated RVSP 46 TEE 03/2012:  A few late bubbles seen felt c/w small IP shunting.  Cardiac MRI 2013:  No infiltrative process.   Twelve-lead EKG: September 08, 2017 reviewed showing QTc interval of 4.65 ms  Records from her visit with cardiology reviewed for pulmonary hypertension.  An  echocardiogram did not show evidence of RV failure     Assessment & Plan:    Back pain, unspecified back location, unspecified back pain laterality, unspecified chronicity - Plan: Urinalysis, Routine w reflex microscopic  Chronic respiratory failure with hypoxia (HCC)  Chronic obstructive pulmonary disease with acute exacerbation (HCC)  COPD with acute exacerbation (HCC)  Pulmonary hypertension (HCC)  Pseudomonas aeruginosa colonization  ILD (interstitial lung disease) ([place]   Discussion: MTolacontinues to struggle from her severe chronic respiratory failure with hypoxemia, Pseudomonas colonization, pulmonary hypertension, and COPD.  We have started her on every other month inhaled tobramycin to try to reduce the frequency of COPD exacerbations.  She had some questions today about whether or not the tobramycin can be used with her albuterol.  She seems to do better with nebulized medicines so I would like to try switching from Symbicort to [facility] and Pulmicort for a time.  If this is helpful then we can make this switch permanently.  I think her lungs are so weak that she may not be able to inhale Symbicort as well and obtain benefit from it.  Plan: Severe COPD with recurrent exacerbations: Continue prednisone Continue daily azithromycin Stop Symbicort Start taking Brovana and Pulmicort twice a day, these prescriptions will come from a DME company or specialty pharmacy.  If you have questions about cost or access to this medicine please do not hesitate to call us back to clarify. You can resume taking albuterol twice a day, even when you are taking inhaled tobramycin High dose flu shot today  Chronic respiratory failure with hypoxemia: Keep using 5 to 8 L of oxygen continuously  Chronic Pseudomonas colonization with recurrent exacerbation: Continue daily azithromycin Tobramycin is meant to be taken twice a day every other month nebulized  Pulmonary hypertension: Keep your  appointments with the advanced heart failure clinic Keep taking diuretics as directed by the advanced heart failure clinic  Back pain: concern for a kidney infection Urinalysis  CODE STATUS: DNR  We will see you back in 4 weeks with a nurse practitioner for a med calendar visit, bring all medications to that visit   > 50% of this     Current Outpatient Medications:  .  acetaminophen (TYLENOL) 500 MG tablet, Take 1 tablet (500 mg total) by mouth every 6 (six) hours as needed for mild pain or headache., Disp: 30 tablet, Rfl: 0 .  albuterol (PROVENTIL) (2.5 MG/3ML) 0.083% nebulizer solution, USE 1 VIAL VIA NEBULIZER EVERY 6 HOURS AS NEEDED FOR WHEEZING OR SHORTNESS OF BREATH, Disp: 75 mL, Rfl: 0 .  ALPRAZolam (XANAX) 0.5 MG tablet, Take 0.25-0.5 mg by mouth daily as needed for anxiety. , Disp: , Rfl: 4 .  atorvastatin (LIPITOR) 40 MG tablet, Take 40 mg by mouth at bedtime. , Disp: , Rfl:  .  azithromycin (ZITHROMAX) 250 MG tablet, TAKE 1 TABLET BY MOUTH DAILY. START AFTER FINISHING LEVAQUIN, Disp: 90 tablet, Rfl: 3 .  benzonatate (TESSALON) 200 MG capsule, Take 400 mg by mouth at bedtime. , Disp: , Rfl:  .  budesonide-formoterol (SYMBICORT) 160-4.5 MCG/ACT inhaler, Inhale 2 puffs into the lungs 2 (two) times daily., Disp: 2 Inhaler, Rfl: 0 .  buPROPion (WELLBUTRIN XL) 300 MG 24 hr tablet, Take 300 mg by mouth daily., Disp: , Rfl:  .  DULoxetine (CYMBALTA) 60 MG capsule, Take 60 mg by mouth daily. , Disp: , Rfl:  .  gabapentin (NEURONTIN) 600 MG tablet, Take 600 mg by mouth 3 (three) times daily. , Disp: , Rfl:  .  Guaifenesin (MUCINEX MAXIMUM STRENGTH) 1200 MG TB12, Take 1,200 mg by mouth 2 (two) times daily as needed (for cough or congestion). , Disp: , Rfl:  .  HYDROcodone-acetaminophen (NORCO/VICODIN) 5-325 MG per tablet, Take 1 tablet by mouth See admin instructions. EVERY 4-6 HOURS AS NEEDED FOR PAIN OR SHORTNESS OF BREATH, Disp: , Rfl:  .  insulin aspart (NOVOLOG FLEXPEN) 100 UNIT/ML  FlexPen, Inject 5 Units into the skin 3 (three) times daily as needed for high blood sugar., Disp: , Rfl:  .  insulin glargine (LANTUS) 100 UNIT/ML injection, Inject 0.66 mLs (66 Units total) into the skin daily before breakfast. (Patient taking differently: Inject 64 Units into the skin daily before breakfast. ), Disp: , Rfl:  .  iron polysaccharides (NIFEREX) 150 MG capsule, Take 1 capsule (150 mg total) by mouth 2 (two) times daily. (Patient taking differently: Take 150 mg by mouth daily. ), Disp: 60 capsule, Rfl: 0 .  levalbuterol (XOPENEX HFA) 45 MCG/ACT inhaler, Inhale 1-2 puffs into the lungs every 4 (four) hours as needed for wheezing. (Patient taking differently: Inhale 2 puffs into the lungs every 4 (four) hours as needed for wheezing or shortness of breath. ), Disp: 1 Inhaler, Rfl: 12 .  metolazone (ZAROXOLYN) 5 MG tablet, Take 5 mg by mouth daily as needed., Disp: , Rfl:  .  Multiple Vitamin (MULTIVITAMIN WITH MINERALS) TABS tablet, Take 1 tablet by mouth daily., Disp: , Rfl:  .  omeprazole (PRILOSEC OTC) 20 MG tablet, Take 20 mg by mouth at bedtime., Disp: , Rfl:  .  OXYGEN, Inhale 5-8 L into the lungs See admin instructions. 5 liters when at rest and 8 liters when ambulatory, Disp: , Rfl:  .  polyethylene glycol (MIRALAX / GLYCOLAX) packet, Take 17 g by mouth daily. Mix in 8 oz liquid and drink, Disp: , Rfl:  .  polyvinyl alcohol (ARTIFICIAL TEARS) 1.4 % ophthalmic solution, Place 1 drop into both eyes 2 (two) times daily. , Disp: , Rfl:  .  potassium chloride (K-DUR,KLOR-CON) 10 MEQ tablet, Take 3 tablets (30 mEq total) by mouth 2 (two) times daily., Disp: 180 tablet, Rfl: 5 .  predniSONE (DELTASONE) 5 MG tablet, TAKE 1 TABLET(5 MG) BY MOUTH DAILY WITH BREAKFAST, Disp: 30 tablet, Rfl: 0 .  roflumilast (DALIRESP) 500 MCG TABS tablet, Take 500 mcg by mouth daily., Disp: , Rfl:  .  sodium chloride HYPERTONIC 3 % nebulizer solution, Take by nebulization 2 (two) times daily., Disp: 750 mL,  Rfl: 11 .  SPIRIVA HANDIHALER 18 MCG inhalation capsule, INHALE THE CONTENTS OF 1 CAPSULE VIA HANDIHALER BY MOUTH EVERY MORNING, Disp: 30 capsule, Rfl: 5 .  SYMBICORT 160-4.5 MCG/ACT inhaler, INHALE 2 PUFFS BY MOUTH TWICE DAILY, Disp: 10.2 g, Rfl: 5 .  tobramycin, PF, (TOBI) 300 MG/5ML nebulizer solution, Take 5 mLs (300 mg total) by nebulization 2 (two) times daily. Every other month DX: J44.9, Disp: 300 mL, Rfl: 5 .  torsemide (DEMADEX) 20 MG tablet, Take 80 mg (4 tabs) in am and 40 mg (2 tabs) in pm, Disp: 180 tablet, Rfl: 11 .  verapamil (VERELAN PM) 360 MG 24 hr capsule, Take 1 capsule (360 mg total) by mouth daily., Disp: 30 capsule, Rfl: 3 .  XARELTO 20 MG TABS tablet, Take 20 mg by mouth daily with supper., Disp: , Rfl: 6 .  arformoterol (BROVANA) 15 MCG/2ML NEBU, Take 2 mLs (15 mcg total) by nebulization 2 (two) times daily., Disp: 120 mL, Rfl: 11 .  budesonide (PULMICORT) 0.5 MG/2ML nebulizer solution, Take 2 mLs (0.5 mg total) by nebulization 2 (two) times daily., Disp: 120 mL, Rfl: 12

## 2018-03-05 ENCOUNTER — Other Ambulatory Visit: Payer: Self-pay | Admitting: Pulmonary Disease

## 2018-03-05 MED ORDER — SULFAMETHOXAZOLE-TRIMETHOPRIM 800-160 MG PO TABS: 1.0000 | ORAL_TABLET | Freq: Two times a day (BID) | ORAL | 0 refills | Status: AC

## 2018-03-09 ENCOUNTER — Telehealth: Payer: Self-pay | Admitting: Pulmonary Disease

## 2018-03-09 NOTE — Telephone Encounter (Signed)
Called and spoke with patient she wanted to know how often she needed to take the tobramycin and albuterol. Advisement given. Nothing further needed.

## 2018-03-12 ENCOUNTER — Ambulatory Visit (HOSPITAL_COMMUNITY)
Admission: RE | Admit: 2018-03-12 | Discharge: 2018-03-12 | Disposition: A | Discharge status: Home / Self Care | Payer: Medicare Other | Source: Ambulatory Visit | Attending: Cardiology | Admitting: Cardiology

## 2018-03-12 VITALS — BP 128/89 | HR 120 | Wt 207.8 lb

## 2018-03-12 DIAGNOSIS — I5032 Chronic diastolic (congestive) heart failure: Secondary | ICD-10-CM | POA: Insufficient documentation

## 2018-03-12 DIAGNOSIS — I272 Pulmonary hypertension, unspecified: Secondary | ICD-10-CM | POA: Diagnosis not present

## 2018-03-12 DIAGNOSIS — Z7951 Long term (current) use of inhaled steroids: Secondary | ICD-10-CM | POA: Diagnosis not present

## 2018-03-12 DIAGNOSIS — Z8249 Family history of ischemic heart disease and other diseases of the circulatory system: Secondary | ICD-10-CM | POA: Insufficient documentation

## 2018-03-12 DIAGNOSIS — R000 Tachycardia, unspecified: Secondary | ICD-10-CM | POA: Insufficient documentation

## 2018-03-12 DIAGNOSIS — R059 Cough, unspecified: Secondary | ICD-10-CM

## 2018-03-12 DIAGNOSIS — Z79899 Other long term (current) drug therapy: Secondary | ICD-10-CM | POA: Diagnosis not present

## 2018-03-12 DIAGNOSIS — Z7952 Long term (current) use of systemic steroids: Secondary | ICD-10-CM | POA: Diagnosis not present

## 2018-03-12 DIAGNOSIS — E669 Obesity, unspecified: Secondary | ICD-10-CM | POA: Diagnosis not present

## 2018-03-12 DIAGNOSIS — Z87891 Personal history of nicotine dependence: Secondary | ICD-10-CM | POA: Insufficient documentation

## 2018-03-12 DIAGNOSIS — Z7901 Long term (current) use of anticoagulants: Secondary | ICD-10-CM | POA: Insufficient documentation

## 2018-03-12 DIAGNOSIS — G4733 Obstructive sleep apnea (adult) (pediatric): Secondary | ICD-10-CM | POA: Insufficient documentation

## 2018-03-12 DIAGNOSIS — J449 Chronic obstructive pulmonary disease, unspecified: Secondary | ICD-10-CM | POA: Diagnosis not present

## 2018-03-12 DIAGNOSIS — R05 Cough: Secondary | ICD-10-CM

## 2018-03-12 DIAGNOSIS — I480 Paroxysmal atrial fibrillation: Secondary | ICD-10-CM | POA: Insufficient documentation

## 2018-03-12 DIAGNOSIS — Z2239 Carrier of other specified bacterial diseases: Secondary | ICD-10-CM | POA: Insufficient documentation

## 2018-03-12 DIAGNOSIS — Z794 Long term (current) use of insulin: Secondary | ICD-10-CM | POA: Diagnosis not present

## 2018-03-12 DIAGNOSIS — I493 Ventricular premature depolarization: Secondary | ICD-10-CM | POA: Insufficient documentation

## 2018-03-12 DIAGNOSIS — J849 Interstitial pulmonary disease, unspecified: Secondary | ICD-10-CM | POA: Insufficient documentation

## 2018-03-12 DIAGNOSIS — Z9981 Dependence on supplemental oxygen: Secondary | ICD-10-CM | POA: Diagnosis not present

## 2018-03-12 DIAGNOSIS — I110 Hypertensive heart disease with heart failure: Secondary | ICD-10-CM | POA: Insufficient documentation

## 2018-03-12 DIAGNOSIS — Z833 Family history of diabetes mellitus: Secondary | ICD-10-CM | POA: Insufficient documentation

## 2018-03-12 DIAGNOSIS — J441 Chronic obstructive pulmonary disease with (acute) exacerbation: Secondary | ICD-10-CM

## 2018-03-12 DIAGNOSIS — E119 Type 2 diabetes mellitus without complications: Secondary | ICD-10-CM | POA: Insufficient documentation

## 2018-03-12 MED ORDER — PREDNISONE 10 MG PO TABS: ORAL_TABLET | ORAL | 0 refills | Status: DC

## 2018-03-12 NOTE — Patient Instructions (Addendum)
Your provider requests you have a chest x ray today.   Take Prednisone 40mg  (4 tablets) for 3 days, then 20mg  (2 tablets) for 3 days, then 10mg  (1 tablet) for 3 days, then 5mg  (1/2) for 3 days.  Follow up in 3 months.

## 2018-03-14 NOTE — Progress Notes (Signed)
Patient ID: Destiny Shelton, female   DOB: [birth year], 72 y.o.   MRN: [id number]  PCP: Dr. Brigitte Pulse HF Cardiology: Dr. Aundra Dubin  72 y.o.with history of paroxysmal atrial fibrillation, chronic diastolic CHF with restrictive hemodynamics on 2013 RHC, COPD on home oxygen, inappropriate sinus tachycardia, and concern for intracardiac shunting presents for followup of diastolic CHF.  She has been followed for several years by both cardiology and pulmonology.  She is a prior smoker and has been diagnosed with COPD.  Interestingly, her PFTs in 10/16 showed improvement, suggesting mild obstructive airways disease.  However, she still requires home oxygen.  She had transcranial dopplers in 2013 suggesting a medium-sized right to left shunt.  This led to an extensive workup.  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  Cardiac MRI was unremarkable, no evidence for shunt lesion.  RHC/LHC showed no coronary disease but it did show restrictive hemodynamics and volume overload.  Qp/Qs from this study was 1.27/1, suggesting a relatively small left to right shunt.  A definite shunt lesion was never identified.    Echo in 2/17 showed normal LV size and systolic function and normal RV.  I also did a RHC in 2/17: on higher dose diuretics, she had mild pulmonary hypertension and normal PCWP.  There was no evidence for a shunt lesion with Qp/Qs 0.97.   She was admitted in 9/17 with acute exacerbation of COPD.  She was treated with steroids and nebs.  She was admitted in 12/17 with suspected COPD exacerbation, treated with steroids and levofloxacin.  Lasix was decreased to 80 mg once daily but I increased it back to 80 qam/40 qpm when I saw her in followup, later up to Lasix 80 mg bid.  Subsequently, metolazone was started twice a week.   In 8/18, she was admitted with PNA and severe epistaxis.  She was in the ER again with epistaxis in 10/18.  She subsequently saw an ENT with cautery done in her nose.  She has  had only 1 episode of epistaxis since then and is on Xarelto still.    Most recent echo in 8/19 showed EF 55%, mild RV dilation with normal systolic function, D-shaped interventricular septum, unable to estimate PA systolic pressure.   Weight today is down 11 lbs.  She takes inhaled tobramycin with chronic Pseudomonas, and she is on Daliresp.  She takes azithromycin chronically.  She is using oxygen 5L at rest, 8L with exertion. Today, oxygen saturation was in the upper 70s on 5L when she walked in but rose to 93% at rest. She has been more short of breath for the last several weeks.  Today, she is short of breath with any exertion and needed a wheelchair to get in here today. She has been coughing.  No chest pain, no fever.  No peripheral edema.  No orthopnea/PND.    Labs (9/16): digoxin 0.6, K 4.3, creatinine 0.85 Labs (1/17): K 3.9, creatinine 0.96, hgb 8.9 Labs (3/17): K 4.2, creatinine 0.96 Labs (6/17): K 4.2, creatinine 1.03 Labs (8/17): K 4.1, creatinine 0.81, HCT 34.3 Labs (9/17): K 3.7, creatinine 0.91, hgb 12.1 Labs (12/17): K 3.4, creatinine 0.84, hgb 13 Labs (1/18): K 4.2, creatinine 1.27 Labs (6/18): pro-BNP 42 Labs (7/18): K 4.5, creatinine 0.93 Labs (10/18): K 3.8, creatinine 1.38, hgb 8.1 Labs (12/18): K 4.4, creatinine 1.11, hgb 8.4 Labs (4/19): K 4.2, creatinine 0.92, hgb 10.6 Labs (8/19): K 4.6, creatinine 0.94  ECG (personally reviewed): Sinus tachycardia with PVC  PMH:  1. Atrial fibrillation: Paroxysmal.  2. HTN 3. COPD: Home oxygen.  PFTs (10/16) with FVC 77%, FEV1 68%, TLC 89%.  Now severe.  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  4. Type II diabetes 5. Obesity 6. Inappropriate sinus tachycardia 7. OSA: Does not use CPAP, uses oxygen at night.  8. Chronic diastolic CHF: RHC/LHC in 4/09 with no significant CAD, mean RA 19, RV 46/18, PA 45/29 mean 37, mean PCWP 22, CI 4.1, PVR 1.4 WU => restrictive hemodynamics with no evidence for constriction;  Qp/Qs 1.27/1;  saturation run difficult to interpret.  TEE (10/13): EF 55-60%, LVH, mild MR, bubble study showed no evidence for immediate R=>L bubble crossing, small amount of bubbles ended up in left heart > 5 seconds after injection suggesting possible intrapulmonary shunting; no shunt by color doppler.  Cardiac MRI (8/13) with EF 59%, no LGE, normal RV size/systolic function => interpreted as normal.  RHC 07/04/2015: RA mean 9, RV 45/10, PA 41/20 mean 30, PCWP mean 13, no step up on shunt run,cardiac output (Fick) 6.7 cardiac index (Fick) 3.3, PVR 2.5 WU, Qp/Qs = 0.97. Echo (2/17) with EF 55-60%, mild LVH, mild MR, normal RV size and systolic function.  - Echo (8/19): EF 55%, mild RV dilation with normal systolic function, D-shaped interventricular septum, unable to estimate PA systolic pressure.  9. ?Intracardiac shunt: Transcranial dopplers in 10/13 were suggestive of medium-sized right to left shunt.  Bubble study on TEE in 10/13 showed no evidence for immediate R=>L bubble crossing, small amount of bubbles ended up in left heart > 5 seconds after injection suggesting possible intrapulmonary shunting; no shunt by color doppler. Cardiac MRI showed no evidence for shunting.  Cardiac cath in 2013 actually was suggestive of a small left to right shunt.  Cardiac cath in 2/17 NOT suggestive of significant shunt, Qp/Qs 0.97.  10. Interstitial lung disease: HRCT in 1/18 was concerning for ILD, possibly scarring from prior respiratory infections.   SH: Married, prior heavy smoker (quit 1996).  No ETOH.   Family History  Problem Relation Age of Onset  . Heart disease Mother   . Stroke Mother   . Heart disease Father   . Heart disease Brother   . Stroke Brother   . Skin cancer Brother   . Colon polyps Sister   . Diabetes Sister   . Diabetes Brother   . Irritable bowel syndrome Daughter   . Colon cancer Neg Hx    ROS: All systems reviewed and negative except as per HPI.   Current Outpatient Medications  Medication  Sig Dispense Refill  . acetaminophen (TYLENOL) 500 MG tablet Take 1 tablet (500 mg total) by mouth every 6 (six) hours as needed for mild pain or headache. 30 tablet 0  . albuterol (PROVENTIL) (2.5 MG/3ML) 0.083% nebulizer solution USE 1 VIAL VIA NEBULIZER EVERY 6 HOURS AS NEEDED FOR WHEEZING OR SHORTNESS OF BREATH 120 mL 5  . ALPRAZolam (XANAX) 0.5 MG tablet Take 0.25-0.5 mg by mouth daily as needed for anxiety.   4  . arformoterol (BROVANA) 15 MCG/2ML NEBU Take 2 mLs (15 mcg total) by nebulization 2 (two) times daily. 120 mL 11  . atorvastatin (LIPITOR) 40 MG tablet Take 40 mg by mouth at bedtime.     Marland Kitchen azithromycin (ZITHROMAX) 250 MG tablet Take 250 mg by mouth daily.    . benzonatate (TESSALON) 200 MG capsule Take 400 mg by mouth at bedtime.     . budesonide (PULMICORT) 0.5 MG/2ML nebulizer solution Take 2 mLs (  0.5 mg total) by nebulization 2 (two) times daily. 120 mL 12  . buPROPion (WELLBUTRIN XL) 300 MG 24 hr tablet Take 300 mg by mouth daily.    . DULoxetine (CYMBALTA) 60 MG capsule Take 60 mg by mouth daily.     Marland Kitchen gabapentin (NEURONTIN) 600 MG tablet Take 600 mg by mouth 3 (three) times daily.     . Guaifenesin (MUCINEX MAXIMUM STRENGTH) 1200 MG TB12 Take 1,200 mg by mouth 2 (two) times daily as needed (for cough or congestion).     Marland Kitchen HYDROcodone-acetaminophen (NORCO/VICODIN) 5-325 MG per tablet Take 1 tablet by mouth See admin instructions. EVERY 4-6 HOURS AS NEEDED FOR PAIN OR SHORTNESS OF BREATH    . insulin aspart (NOVOLOG FLEXPEN) 100 UNIT/ML FlexPen Inject 5 Units into the skin 3 (three) times daily as needed for high blood sugar.    . insulin glargine (LANTUS) 100 UNIT/ML injection Inject 0.66 mLs (66 Units total) into the skin daily before breakfast. (Patient taking differently: Inject 64 Units into the skin daily before breakfast. )    . iron polysaccharides (NIFEREX) 150 MG capsule Take 1 capsule (150 mg total) by mouth 2 (two) times daily. (Patient taking differently: Take 150 mg  by mouth daily. ) 60 capsule 0  . levalbuterol (XOPENEX HFA) 45 MCG/ACT inhaler Inhale 1-2 puffs into the lungs every 4 (four) hours as needed for wheezing. (Patient taking differently: Inhale 2 puffs into the lungs every 4 (four) hours as needed for wheezing or shortness of breath. ) 1 Inhaler 12  . metolazone (ZAROXOLYN) 5 MG tablet Take 5 mg by mouth daily as needed.    . Multiple Vitamin (MULTIVITAMIN WITH MINERALS) TABS tablet Take 1 tablet by mouth daily.    Marland Kitchen omeprazole (PRILOSEC OTC) 20 MG tablet Take 20 mg by mouth at bedtime.    . OXYGEN Inhale 5-8 L into the lungs See admin instructions. 5 liters when at rest and 8 liters when ambulatory    . polyethylene glycol (MIRALAX / GLYCOLAX) packet Take 17 g by mouth daily. Mix in 8 oz liquid and drink    . polyvinyl alcohol (ARTIFICIAL TEARS) 1.4 % ophthalmic solution Place 1 drop into both eyes 2 (two) times daily.     . potassium chloride (K-DUR,KLOR-CON) 10 MEQ tablet Take 3 tablets (30 mEq total) by mouth 2 (two) times daily. 180 tablet 5  . predniSONE (DELTASONE) 5 MG tablet TAKE 1 TABLET(5 MG) BY MOUTH DAILY WITH BREAKFAST 30 tablet 0  . roflumilast (DALIRESP) 500 MCG TABS tablet Take 500 mcg by mouth daily.    . sodium chloride HYPERTONIC 3 % nebulizer solution Take by nebulization 2 (two) times daily. 750 mL 11  . SPIRIVA HANDIHALER 18 MCG inhalation capsule INHALE THE CONTENTS OF 1 CAPSULE VIA HANDIHALER BY MOUTH EVERY MORNING 30 capsule 5  . SYMBICORT 160-4.5 MCG/ACT inhaler INHALE 2 PUFFS BY MOUTH TWICE DAILY 10.2 g 5  . tobramycin, PF, (TOBI) 300 MG/5ML nebulizer solution Take 5 mLs (300 mg total) by nebulization 2 (two) times daily. Every other month DX: J44.9 300 mL 5  . torsemide (DEMADEX) 20 MG tablet Take 80 mg (4 tabs) in am and 40 mg (2 tabs) in pm 180 tablet 11  . verapamil (VERELAN PM) 360 MG 24 hr capsule Take 1 capsule (360 mg total) by mouth daily. 30 capsule 3  . XARELTO 20 MG TABS tablet Take 20 mg by mouth daily with  supper.  6  . predniSONE (DELTASONE) 10 MG  tablet Take 40mg  (4 tablets) for 3 days, then 20mg  (2 tablets) for 3 days, then 10mg  (1 tablet) for 3 days, then 5mg  (1/2) for 3 days. 23 tablet 0   No current facility-administered medications for this encounter.    BP 128/89   Pulse (!) 120   Wt 94.3 kg (207 lb 12.8 oz)   SpO2 90% Comment: 8L  BMI 40.58 kg/m  General: NAD Neck: No JVD, no thyromegaly or thyroid nodule.  Lungs: Rhonchi bilaterally.  CV: Nondisplaced PMI.  Heart regular S1/S2, no S3/S4, no murmur.  Trace ankle edema.  No carotid bruit.  Normal pedal pulses.  Abdomen: Soft, nontender, no hepatosplenomegaly, no distention.  Skin: Intact without lesions or rashes.  Neurologic: Alert and oriented x 3.  Psych: Normal affect. Extremities: No clubbing or cyanosis.  HEENT: Normal.  .   Assessment/Plan: 1. COPD: On home oxygen, 5L at rest and 8L with exertion.  Severe COPD, Pseudomonas colonization.  Breathing has improved in the past with increased diuresis, suggesting that CHF plays a significant role in her symptoms in addition to COPD.  She has ILD on CT that may be due to chronic scarring from prior respiratory infections.  Today, her breathing is worse.  I do not think that she looks volume overloaded on exam, suspect COPD exacerbation.  I recommended that she go to the ER for evaluation with marked drop in oxygen saturation with exertion, but she refuses.  - I will get a CXR today to assess for PNA.  She is already on azithromycin chronically.  I will start her on a prednisone taper => 40 mg daily x 3 days, 20 mg daily x 3 days, 10 mg daily x 3 days, then back to 5 mg daily (her chronic dose).  - She is on inhaled tobramycin for Pseudomonas colonization.  - She is on Daliresp.  - With suspected COPD exacerbation, I will set up pulmonary followup.  2. Atrial fibrillation: Paroxysmal, CHADSVASC = 4.  NSR today (sinus tachy). - She will continue Xarelto.   - She can continue  verapamil.     3. Chronic diastolic CHF: NYHA class IIIb symptoms, likely due to combination of CHF and COPD/ILD.  She is not volume overloaded today on exam, so I think that symptoms are more likely COPD exacerbation rather than CHF.  - Continue torsemide 80 qam/40 qpm. BMET today.  - She has not been using metolazone.  . 4. ?Shunt lesion: She had transcranial dopplers in 2013 suggesting a medium-sized right to left shunt.  This led to an extensive workup.  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  Cardiac MRI was unremarkable, no evidence for shunt lesion.  RHC/LHC in 2013 showed no coronary disease but it did show restrictive hemodynamics and volume overload.  Qp/Qs from this study was 1.27/1, suggesting a relatively small left to right shunt.  A definite shunt lesion was never identified.  Based on prior workup, it seems most likely that the right to left shunting by transcranial dopplers and TEE bubble study was due to pulmonary AVMs.  RHC repeated in 2/17 did not show a significant shunt lesion, with Qp/Qs 0.97.  5. Inappropriate sinus tachycardia: Had discussed using Corlanor in the past but interacts with verapamil.  6. Pulmonary hypertension: Mild pulmonary hypertension on RHC with PVR only 2.5 WU.  Probably due to COPD/ILD (group 3) and elevated left atrial pressure (group 2).  No specific treatment other than diuresis.  7. Interstitial  lung disease: May be due to lung scarring from prior respiratory infections.  Followed by Dr. Lake Bells.  Dyspnea gradually worsening over time.  8. HTN: BP not elevated.    Followup in 3 months.     Loralie Champagne 03/14/2018

## 2018-03-15 ENCOUNTER — Telehealth (HOSPITAL_COMMUNITY): Payer: Self-pay

## 2018-03-15 NOTE — Telephone Encounter (Signed)
Pt called with results chest xray results no PNA

## 2018-03-16 DIAGNOSIS — Z6836 Body mass index (BMI) 36.0-36.9, adult: Secondary | ICD-10-CM | POA: Diagnosis not present

## 2018-03-16 DIAGNOSIS — M6281 Muscle weakness (generalized): Secondary | ICD-10-CM | POA: Diagnosis not present

## 2018-03-16 DIAGNOSIS — R269 Unspecified abnormalities of gait and mobility: Secondary | ICD-10-CM | POA: Diagnosis not present

## 2018-03-16 DIAGNOSIS — J9611 Chronic respiratory failure with hypoxia: Secondary | ICD-10-CM | POA: Diagnosis not present

## 2018-03-16 DIAGNOSIS — J449 Chronic obstructive pulmonary disease, unspecified: Secondary | ICD-10-CM | POA: Diagnosis not present

## 2018-04-01 ENCOUNTER — Telehealth: Payer: Self-pay | Admitting: Pulmonary Disease

## 2018-04-01 ENCOUNTER — Encounter: Payer: Self-pay | Admitting: Adult Health

## 2018-04-01 ENCOUNTER — Ambulatory Visit: Payer: Medicare Other | Admitting: Adult Health

## 2018-04-01 DIAGNOSIS — I272 Pulmonary hypertension, unspecified: Secondary | ICD-10-CM

## 2018-04-01 DIAGNOSIS — J449 Chronic obstructive pulmonary disease, unspecified: Secondary | ICD-10-CM

## 2018-04-01 DIAGNOSIS — I5032 Chronic diastolic (congestive) heart failure: Secondary | ICD-10-CM

## 2018-04-01 DIAGNOSIS — J849 Interstitial pulmonary disease, unspecified: Secondary | ICD-10-CM

## 2018-04-01 NOTE — Telephone Encounter (Signed)
Plan  Patient Instructions  May stop Brovana and Pulmicort  Restart Symbiocrt 2 puffs Twice daily   Continue on Prednisone 5mg  daily .  Continue on daily Azithromycin .  Continue on Albuterol Neb Twice daily  .  Continue on Spiriva daily  Start Tobramycin Neb on 04/02/2018 Twice daily  (every other month -so will be off December 2019, restart January 06/2018.Marland KitchenMarland KitchenMarland Kitchen)  Continue on 5 l/m rest and 8 l/m activity .   Spoke with patient. She need clarification on her medications. Advised her that per the OV note from today, TP wants her to D/C the Brovana and Pulmicort. She is to restart the Tobramycin every other month. She verbalized understanding. Nothing further needed at time of call.

## 2018-04-01 NOTE — Patient Instructions (Addendum)
May stop Brovana and Pulmicort  Restart Symbiocrt 2 puffs Twice daily   Continue on Prednisone 5mg  daily .  Continue on daily Azithromycin .  Continue on Albuterol Neb Twice daily  .  Continue on Spiriva daily  Start Tobramycin Neb on 04/02/2018 Twice daily  (every other month -so will be off December 2019, restart January 06/2018.Marland KitchenMarland KitchenMarland Kitchen)  Continue on 5 l/m rest and 8 l/m activity .

## 2018-04-01 NOTE — Assessment & Plan Note (Signed)
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  Patient does not care for Brovana and budesonide.  For now we will switch back to Symbicort.  Continue her aggressive maintenance regimen.  Plan  Patient Instructions  May stop Brovana and Pulmicort  Restart Symbiocrt 2 puffs Twice daily   Continue on Prednisone 5mg  daily .  Continue on daily Azithromycin .  Continue on Albuterol Neb Twice daily  .  Continue on Spiriva daily  Start Tobramycin Neb on 04/02/2018 Twice daily  (every other month -so will be off December 2019, restart January 06/2018.Marland KitchenMarland KitchenMarland Kitchen)  Continue on 5 l/m rest and 8 l/m activity .

## 2018-04-01 NOTE — Assessment & Plan Note (Signed)
Continue on oxygen and diuresis

## 2018-04-01 NOTE — Progress Notes (Signed)
_0  ID: Crissie Figures, female    DOB: [birth year], 72 y.o.   MRN: [id number]  Chief Complaint  Patient presents with  . Follow-up    COPD     Referring provider: Marton Redwood, MD  HPI: 73 year old female former smoker followed for COPD, pulmonary hypertension and ILD, O2 RF  And Chronic Bronchitis with chronic Pseudomonas Infection   TEST  06/2016 HRCT Chest >Fibrotic interstitial lung disease characterized by patchy peribronchovascular and subpleural reticulation, mild traction bronchiectasis, mild architectural distortion, mosaic attenuation with air trapping and scattered predominantly centrilobular nodularity. No frank honeycombing. No clear basilar gradient. Findings have progressed compared to 26-Jan-202013 chest CT, with minimal interval progression since 01/22/2016. Findings are favored represent chronic hypersensitivity pneumonitis. Findings are not consistent with usual interstitial pneumonia (UIP). 3. Mild centrilobular and paraseptal emphysema with mild diffuse bronchial wall thickening, suggesting COPD . pfts 2011: FEV1 1.34 (55%), ratio 68, ++airtrapping on lung volumes, nl TLC, DLCO 40% pred. PFT's 03/23/2015 FEV1 1.61 (68 % ) ratio 69 p no % improvement from saba on Symb/spiriva  Echo 2012: nl LV and EF, impaired relaxation, nothing to suggest pulm htn. Myoview 2012: no ischemia LHC 12/2011: non-obstructive cad RHC 12/2011: PA 45/29 with mean 37m, PCWP 22, CO Fick 10.25, PVR 1.17 wood units, slight step up in saturation from RV to PA [place]7/2013: Equal 4 chamber pressures, but no square root sign.  Echo 79/32/6712 Nl LV, diastolic dysfxn, normal RV, estimated RVSP 46.  TEE 03/2012: A few late bubbles seen felt c/w small IP shunting.  CT chest 12/2011: No PE, very mild mosaic perfusion abnl (prob secondary to her known airflow obstruction).  Cardiac MRI 2013: No infiltrative process.  TCD bubble study 03/2012: + for  medium intracardiac right to left shunt.  07/04/2015 RHC: RA mean 9 RV 45/10 PA 41/20, mean 30 PCWP mean 13 Oxygen saturations: SVC 68% RA 65% RV 61% PA 69% LA 98% Peripheral sat 93% Cardiac Output (Fick) 6.7 Cardiac Index (Fick) 3.3 PVR 2.5 WU Qp/Qs = 0.97 06/2016 HRCT>Upper lobe predominant fibrotic change and with centrilobular emphysema, no honeycombing January 2018 pulmonary function testing ratio 70%, FEV1 1.63 L 70% predicted, FVC 2.33 L 76% predicted, total lung capacity 4.48 L 86% predicted, DLCO 5. 09/06/2019 percent predicted BAL 07/2016 >+few psuedomonas-pan sens Daily azithromycin started 2018 Chronic steroids with prednisone 5 mg daily to January 2019 Echo: 12/2017 TTE: LVEF 55%, no LVH, mild RV dilation, no PA pressure estimate March 2019 started on tobramycin nebulizer twice daily every other month   04/01/2018 Follow up : COPD /ILD /O2 RF /Pulmonary HTN  Patient returns for a one-month follow-up.  Patient has COPD and ILD.  [facility] visit [facility]  Patient says she has not like a new nebulizer treatments.  She feels that it makes her cough worse.  Tends to wear her out.  She wants to return back to Symbicort.  She is on a very aggressive regimen with daily Daliresp prednisone 5 mg, albuterol nebulizer twice daily, daily azithromycin Spiriva daily.  Patient is not taking hypertonic nebs as previously recommended. He says overall breathing is doing about the same.  She gets very winded with minimal activity.  She has low energy.  Cough is been at baseline except after she does her nebulizers which has been increased.  Denies any hemoptysis chest pain orthopnea PND or increased leg swelling.  Chest x-ray on October 13 showed chronic changes without acute process.  Patient has chronic bronchitis with chronic Pseudomonas infection.  She is on inhaled tobramycin every other month.  She says she is supposed to start this tomorrow November  1.  She remains on oxygen 5 L at rest and 8 L with activity.  She is aware to only use continuous flow only.   Patient does have pulmonary hypertension.  Says that leg swelling has been at baseline. She remains on torsemide 80 mg in the morning and 40 mg in the evening.  Allergies  Allergen Reactions  . Aspirin Shortness Of Breath  . Nsaids Shortness Of Breath    Immunization History  Administered Date(s) Administered  . Influenza Split 02/17/2011, 02/19/2012, 02/24/2015  . Influenza, High Dose Seasonal PF 03/10/2016, 03/26/2017, 03/04/2018  . Influenza,inj,Quad PF,6+ Mos 03/08/2013  . Influenza-Unspecified 01/31/2014  . Pneumococcal Conjugate-13 08/18/2008    Past Medical History:  Diagnosis Date  . Adenomatous colon polyp   . ALLERGIC RHINITIS   . Anemia    iron deficient  . Anxiety   . Atrial tachycardia (HCC)    Mostly Sinus Tachycardia  . Back pain, chronic   . Carotid stenosis   . CHF (congestive heart failure) ([place])   . Community acquired pneumonia - Recent Admission (07/2013) 07/08/2013  . COPD (chronic obstructive pulmonary disease) ([place])    Requiring Home O2 at 4L  . Depression   . Diabetes mellitus type 2 with neurological manifestations ([place])    On Insulin  . Diverticulosis of colon   . External hemorrhoids   . Fibromyalgia    "pain in arms and shoulders."  . Gastritis   . GERD (gastroesophageal reflux disease)   . Headache(784.0)    tension  . Hyperlipemia   . Hypertension   . Inappropriate sinus tachycardia   . Morbidly obese ([place])   . Nephrolithiasis   . Neuromuscular disorder ([place])    diabetic neuropathy  . Osteoarthritis   . Osteoporosis   . PAF (paroxysmal atrial fibrillation) ([place])   . 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  . Sleep apnea    no cpap machine  02 at 4l/min all the time  . Urosepsis 05/26/2012    Tobacco History: Social History   Tobacco Use  Smoking Status Former Smoker  . Packs/day: 2.00  . Years: 30.00  . Pack years: 60.00  .  Types: Cigarettes  . Last attempt to quit: 06/02/1994  . Years since quitting: 23.8  Smokeless Tobacco Never Used   Counseling given: Not Answered   Outpatient Medications Prior to Visit  Medication Sig Dispense Refill  . acetaminophen (TYLENOL) 500 MG tablet Take 1 tablet (500 mg total) by mouth every 6 (six) hours as needed for mild pain or headache. 30 tablet 0  . albuterol (PROVENTIL) (2.5 MG/3ML) 0.083% nebulizer solution USE 1 VIAL VIA NEBULIZER EVERY 6 HOURS AS NEEDED FOR WHEEZING OR SHORTNESS OF BREATH 120 mL 5  . ALPRAZolam (XANAX) 0.5 MG tablet Take 0.25-0.5 mg by mouth daily as needed for anxiety.   4  . atorvastatin (LIPITOR) 40 MG tablet Take 40 mg by mouth at bedtime.     Marland Kitchen azithromycin (ZITHROMAX) 250 MG tablet Take 250 mg by mouth daily.    . benzonatate (TESSALON) 200 MG capsule Take 400 mg by mouth at bedtime.     Marland Kitchen buPROPion (WELLBUTRIN XL) 300 MG 24 hr tablet Take 300 mg by mouth daily.    . DULoxetine (CYMBALTA) 60 MG capsule Take 60 mg by mouth daily.     Marland Kitchen gabapentin (NEURONTIN)  600 MG tablet Take 600 mg by mouth 3 (three) times daily.     . Guaifenesin (MUCINEX MAXIMUM STRENGTH) 1200 MG TB12 Take 1,200 mg by mouth 2 (two) times daily as needed (for cough or congestion).     Marland Kitchen HYDROcodone-acetaminophen (NORCO/VICODIN) 5-325 MG per tablet Take 1 tablet by mouth See admin instructions. EVERY 4-6 HOURS AS NEEDED FOR PAIN OR SHORTNESS OF BREATH    . insulin aspart (NOVOLOG FLEXPEN) 100 UNIT/ML FlexPen Inject 5 Units into the skin 3 (three) times daily as needed for high blood sugar.    . insulin glargine (LANTUS) 100 UNIT/ML injection Inject 0.66 mLs (66 Units total) into the skin daily before breakfast. (Patient taking differently: Inject 64 Units into the skin daily before breakfast. )    . iron polysaccharides (NIFEREX) 150 MG capsule Take 1 capsule (150 mg total) by mouth 2 (two) times daily. (Patient taking differently: Take 150 mg by mouth daily. ) 60 capsule 0  .  levalbuterol (XOPENEX HFA) 45 MCG/ACT inhaler Inhale 1-2 puffs into the lungs every 4 (four) hours as needed for wheezing. (Patient taking differently: Inhale 2 puffs into the lungs every 4 (four) hours as needed for wheezing or shortness of breath. ) 1 Inhaler 12  . metolazone (ZAROXOLYN) 5 MG tablet Take 5 mg by mouth daily as needed.    . Multiple Vitamin (MULTIVITAMIN WITH MINERALS) TABS tablet Take 1 tablet by mouth daily.    Marland Kitchen omeprazole (PRILOSEC OTC) 20 MG tablet Take 20 mg by mouth at bedtime.    . OXYGEN Inhale 5-8 L into the lungs See admin instructions. 5 liters when at rest and 8 liters when ambulatory    . polyethylene glycol (MIRALAX / GLYCOLAX) packet Take 17 g by mouth daily. Mix in 8 oz liquid and drink    . polyvinyl alcohol (ARTIFICIAL TEARS) 1.4 % ophthalmic solution Place 1 drop into both eyes 2 (two) times daily.     . potassium chloride (K-DUR,KLOR-CON) 10 MEQ tablet Take 3 tablets (30 mEq total) by mouth 2 (two) times daily. 180 tablet 5  . predniSONE (DELTASONE) 5 MG tablet TAKE 1 TABLET(5 MG) BY MOUTH DAILY WITH BREAKFAST 30 tablet 0  . roflumilast (DALIRESP) 500 MCG TABS tablet Take 500 mcg by mouth daily.    Marland Kitchen tobramycin, PF, (TOBI) 300 MG/5ML nebulizer solution Take 5 mLs (300 mg total) by nebulization 2 (two) times daily. Every other month DX: J44.9 300 mL 5  . torsemide (DEMADEX) 20 MG tablet Take 80 mg (4 tabs) in am and 40 mg (2 tabs) in pm 180 tablet 11  . verapamil (VERELAN PM) 360 MG 24 hr capsule Take 1 capsule (360 mg total) by mouth daily. 30 capsule 3  . XARELTO 20 MG TABS tablet Take 20 mg by mouth daily with supper.  6  . SPIRIVA HANDIHALER 18 MCG inhalation capsule INHALE THE CONTENTS OF 1 CAPSULE VIA HANDIHALER BY MOUTH EVERY MORNING (Patient not taking: Reported on 04/01/2018) 30 capsule 5  . SYMBICORT 160-4.5 MCG/ACT inhaler INHALE 2 PUFFS BY MOUTH TWICE DAILY (Patient not taking: Reported on 04/01/2018) 10.2 g 5  . arformoterol (BROVANA) 15 MCG/2ML NEBU  Take 2 mLs (15 mcg total) by nebulization 2 (two) times daily. (Patient not taking: Reported on 04/01/2018) 120 mL 11  . budesonide (PULMICORT) 0.5 MG/2ML nebulizer solution Take 2 mLs (0.5 mg total) by nebulization 2 (two) times daily. (Patient not taking: Reported on 04/01/2018) 120 mL 12  . predniSONE (DELTASONE) 10 MG  tablet Take 66m (4 tablets) for 3 days, then 239m(2 tablets) for 3 days, then 1052m1 tablet) for 3 days, then 5mg20m/2) for 3 days. (Patient not taking: Reported on 04/01/2018) 23 tablet 0  . sodium chloride HYPERTONIC 3 % nebulizer solution Take by nebulization 2 (two) times daily. (Patient not taking: Reported on 04/01/2018) 750 mL 11   No facility-administered medications prior to visit.      Review of Systems  Constitutional:   No  weight loss, night sweats,  Fevers, chills,  +fatigue, or  lassitude.  HEENT:   No headaches,  Difficulty swallowing,  Tooth/dental problems, or  Sore throat,                No sneezing, itching, ear ache, nasal congestion, post nasal drip,   CV:  No chest pain,  Orthopnea, PND, swelling in lower extremities, anasarca, dizziness, palpitations, syncope.   GI  No heartburn, indigestion, abdominal pain, nausea, vomiting, diarrhea, change in bowel habits, loss of appetite, bloody stools.   Resp:    No chest wall deformity  Skin: no rash or lesions.  GU: no dysuria, change in color of urine, no urgency or frequency.  No flank pain, no hematuria   MS:  No joint pain or swelling.  No decreased range of motion.  No back pain.    Physical Exam  BP 132/70 (BP Location: Left Arm, Cuff Size: Normal)   Pulse (!) 112   Ht 5' (1.524 m)   Wt 208 lb (94.3 kg)   SpO2 96%   BMI 40.62 kg/m   GEN: A/Ox3; pleasant , NAD, obese , on o2 , walker    HEENT:  [place]/AT,  EACs-clear, TMs-wnl, NOSE-clear drainage, THROAT-clear, no lesions, no postnasal drip or exudate noted.   NECK:  Supple w/ fair ROM; no JVD; normal carotid impulses w/o bruits; no  thyromegaly or nodules palpated; no lymphadenopathy.    RESP  Decreased BS in bases, w/o, wheezes/ rales/ or rhonchi. no accessory muscle use, no dullness to percussion  CARD:  RRR, no m/r/g, tr to 1+ peripheral edema, pulses intact, no cyanosis or clubbing.  GI:   Soft & nt; nml bowel sounds; no organomegaly or masses detected.   Musco: Warm bil, no deformities or joint swelling noted.   Neuro: alert, no focal deficits noted.    Skin: Warm, no lesions or rashes    Lab Results:  CBC   Imaging: Dg Chest 2 View  Result Date: 03/14/2018 CLINICAL DATA:  Patient reports cough and congestion with green discharge for 2 weeks. SOB. Hx of atrial tachycardia, chf, copd, diabetes controlled with insulin, htn controlled with medication, PAF. Hx of video bronch 07/22/2016, TEE without cardioversion 03/24/2012, heart cath with coronary angiogram 12/19/2011, cardiac cath 07/04/2015. Ex smoker quit 20 years ago. EXAM: CHEST - 2 VIEW COMPARISON:  11/10/2017 and 11/05/2016. FINDINGS: Cardiac silhouette is top-normal in size. No mediastinal or hilar masses. There is no evidence of adenopathy. There are thickened bronchovascular and interstitial markings, stable from the prior exams. Lungs are mildly hyperexpanded. No evidence of pneumonia or pulmonary edema. No pleural effusion or pneumothorax. Skeletal structures are demineralized. There is a compression deformity of L1 or L2, new since the prior exams. IMPRESSION: 1. No acute cardiopulmonary disease. 2. Chronic lung changes stable from prior studies. 3. Moderate compression deformity of an upper lumbar vertebra, new since the prior exams. This could be recent if there is back pain. Electronically Signed   By: DaviShanon Brow  Ormond M.D.   On: 03/14/2018 17:19      PFT Results Latest Ref Rng & Units 06/15/2017 06/26/2016 03/23/2015  FVC-Pre L 2.29 2.30 2.42  FVC-Predicted Pre % 75 75 77  FVC-Post L 2.28 2.33 2.34  FVC-Predicted Post % 75 76 75  Pre FEV1/FVC % % 57  65 67  Post FEV1/FCV % % 66 70 69  FEV1-Pre L 1.30 1.50 1.61  FEV1-Predicted Pre % 57 65 68  DLCO UNC% % 20 21 -  DLCO COR %Predicted % 26 29 -  TLC L 5.00 4.48 4.67  TLC % Predicted % 96 86 89  RV % Predicted % 126 100 111    No results found for: NITRICOXIDE      Assessment & Plan:   Chronic diastolic heart failure of unknown etiology ([place]) Appears compensated No Changes  Pulmonary hypertension (HCC) Continue on oxygen and diuresis  COPD (chronic obstructive pulmonary disease) (HCC) COPD and chronic bronchitis with Pseudomonas colonization.  Patient does not care for Brovana and budesonide.  For now we will switch back to Symbicort.  Continue her aggressive maintenance regimen.  Plan  Patient Instructions  May stop Brovana and Pulmicort  Restart Symbiocrt 2 puffs Twice daily   Continue on Prednisone 53m daily .  Continue on daily Azithromycin .  Continue on Albuterol Neb Twice daily  .  Continue on Spiriva daily  Start Tobramycin Neb on 04/02/2018 Twice daily  (every other month -so will be off December 2019, restart January 06/2018..Marland KitchenMarland KitchenMarland Kitchen  Continue on 5 l/m rest and 8 l/m activity .       ILD (interstitial lung disease) ([place] on current regimen      TRexene Edison NP 04/01/2018

## 2018-04-01 NOTE — Assessment & Plan Note (Signed)
Cont on current regimen  

## 2018-04-01 NOTE — Assessment & Plan Note (Addendum)
Appears compensated No Changes

## 2018-04-06 ENCOUNTER — Other Ambulatory Visit: Payer: Self-pay | Admitting: Pulmonary Disease

## 2018-04-14 ENCOUNTER — Telehealth: Payer: Self-pay | Admitting: Pulmonary Disease

## 2018-04-14 NOTE — Telephone Encounter (Signed)
Called pharmacy unable to reach was placed on hold for over 10 minutes.

## 2018-04-15 ENCOUNTER — Telehealth: Payer: Self-pay | Admitting: Pulmonary Disease

## 2018-04-15 NOTE — Telephone Encounter (Signed)
Called and spoke with pharmacist from [facility] she stated that the code that was given yesterday did not go through. Verified code given and it still is not going through. Imarie stated that the pharmacist who ran the code yesterday was a float and she is not there today. They are going to look into this and call us back if anything else is needed.

## 2018-04-15 NOTE — Telephone Encounter (Signed)
Called and spoke to pharmacist, code J84.9 and J96.10. Pharmacist states he was busy at the moment but he would try the codes and call back if they did not work when he got a chance. Voiced understanding. Nothing further is needed at this time.

## 2018-04-15 NOTE — Telephone Encounter (Signed)
Called pt's pharmacy and spoke with Anmire to give a different diagnosis code for pt's tobramycin nebulization solution.  The different code that was given was Z22.39 for chronic pseudomonas colonization with recurrent exacerbation as I saw in BQ's last OV note that stated tobramycin is meant to be taken twice daily via nebulized.  Per Anmire, this code worked for [employer] med. Nothing further needed.

## 2018-04-23 ENCOUNTER — Telehealth: Payer: Self-pay | Admitting: Pulmonary Disease

## 2018-04-23 NOTE — Telephone Encounter (Signed)
Spoke with pharmacist and he stated none of the codes we gave him would not go through COPD J44.9, Pseudomonas Z22.39, ILD J84.9. She has gotten this Rx before and the pharmacist stated they were running the medication through her Medicare Part B. I just cant think of any other code to use. She states she has an appt with TP on 05/13/2018. She states we had this problem the last time and she may just need to be switched to something else. FYI BQ

## 2018-04-26 NOTE — Telephone Encounter (Signed)
Try J47.9

## 2018-04-26 NOTE — Telephone Encounter (Signed)
Called and spoke with pharmacist. Code was given however this done not work.   BQ please advise, thank you.

## 2018-04-28 NOTE — Telephone Encounter (Signed)
B96.5 ICD 10 Try that

## 2018-04-28 NOTE — Telephone Encounter (Signed)
Called and spoke with Imari, she stated that when she called the Medicare part B dept and spoke Joneen Boers he stated that a CMN form is to be filled out. Will await form to be filled out for patient.

## 2018-04-28 NOTE — Telephone Encounter (Signed)
BQ please advise once available, thank you.

## 2018-04-28 NOTE — Telephone Encounter (Signed)
Called and spoke with Imari with [facility] at phone [phone] In regards to using ICD 10 codes for rx Tobramycin to be approved Tried ICD 10 Codes J44.1, J84.9, I27.20, J44.9 and B96.5 nothing worked  Musician and spoke with Joneen Boers with [employer] part B billing dept at phone [phone] He advised no code is needed, the medication for the pt is approved until 02/08/2019  Called Imari at [facility] back advised her of the [employer] provided. Advised her that if it is rejected again to call Joneen Boers in [employer] Provided the phone number to [place], she is calling him today. She advised that she will call back with update for this pt's medication for Tobramycin neb solution.  Hold in triage.

## 2018-04-28 NOTE — Telephone Encounter (Signed)
[facility] pharmacy returned phone call; contact # [phone]

## 2018-04-30 NOTE — Telephone Encounter (Signed)
Rodena Piety, please advise if you have a cmn form on pt. Thanks!

## 2018-05-03 ENCOUNTER — Other Ambulatory Visit: Payer: Self-pay

## 2018-05-03 MED ORDER — ALBUTEROL SULFATE (2.5 MG/3ML) 0.083% IN NEBU: INHALATION_SOLUTION | RESPIRATORY_TRACT | 5 refills | Status: AC

## 2018-05-04 NOTE — Telephone Encounter (Signed)
I have not seen a CMN for this patient as of yet

## 2018-05-06 ENCOUNTER — Other Ambulatory Visit (HOSPITAL_COMMUNITY): Payer: Self-pay

## 2018-05-06 MED ORDER — VERAPAMIL HCL ER 360 MG PO CP24: 360.0000 mg | ORAL_CAPSULE | Freq: Every day | ORAL | 3 refills | Status: DC

## 2018-05-06 NOTE — Telephone Encounter (Signed)
Called [facility] and spoke with Kristeen Miss to see if a CMN form was still needing to be filled for pt's Tobramycin. Per Kristeen Miss, they are still unable to run the med for pt and they are still needing the CMN form.  I stated to Kristeen Miss that we still had not received the CMN form. Also stated to her that we had moved to a new office and had a new fax number which I did provide her with. Kristeen Miss stated to me that she would fax the form over to our office for BQ to fill out for pt's med.   Routing to [place] so she can be on the look out for the form.

## 2018-05-08 ENCOUNTER — Other Ambulatory Visit: Payer: Self-pay | Admitting: Pulmonary Disease

## 2018-05-10 NOTE — Telephone Encounter (Signed)
After speaking with Rodena Piety, she states she usually doesn't work with [employer]. After speaking with BQs nurse, the form is in his to do folder on POD A.   BQ will be back in office next week (05/17/2018) Will route to Burman Nieves to follow up on .

## 2018-05-11 NOTE — Telephone Encounter (Signed)
noted 

## 2018-05-11 NOTE — Telephone Encounter (Signed)
Letter has been placed in the folder on POD A for BQ to fill out and sign once he returns to the office. Burman Nieves, BQ nurse is aware of this. Will also route to BQ as an FYI so he knows that this is to be filled out.

## 2018-05-12 DIAGNOSIS — E7849 Other hyperlipidemia: Secondary | ICD-10-CM | POA: Diagnosis not present

## 2018-05-12 DIAGNOSIS — I2789 Other specified pulmonary heart diseases: Secondary | ICD-10-CM | POA: Diagnosis not present

## 2018-05-12 DIAGNOSIS — J9611 Chronic respiratory failure with hypoxia: Secondary | ICD-10-CM | POA: Diagnosis not present

## 2018-05-12 DIAGNOSIS — Z7901 Long term (current) use of anticoagulants: Secondary | ICD-10-CM | POA: Diagnosis not present

## 2018-05-12 DIAGNOSIS — I10 Essential (primary) hypertension: Secondary | ICD-10-CM | POA: Diagnosis not present

## 2018-05-12 DIAGNOSIS — I5032 Chronic diastolic (congestive) heart failure: Secondary | ICD-10-CM | POA: Diagnosis not present

## 2018-05-12 DIAGNOSIS — E1139 Type 2 diabetes mellitus with other diabetic ophthalmic complication: Secondary | ICD-10-CM | POA: Diagnosis not present

## 2018-05-12 DIAGNOSIS — N183 Chronic kidney disease, stage 3 (moderate): Secondary | ICD-10-CM | POA: Diagnosis not present

## 2018-05-12 DIAGNOSIS — I4891 Unspecified atrial fibrillation: Secondary | ICD-10-CM | POA: Diagnosis not present

## 2018-05-12 DIAGNOSIS — J449 Chronic obstructive pulmonary disease, unspecified: Secondary | ICD-10-CM | POA: Diagnosis not present

## 2018-05-12 DIAGNOSIS — Z6834 Body mass index (BMI) 34.0-34.9, adult: Secondary | ICD-10-CM | POA: Diagnosis not present

## 2018-05-12 DIAGNOSIS — N3941 Urge incontinence: Secondary | ICD-10-CM | POA: Diagnosis not present

## 2018-05-13 ENCOUNTER — Ambulatory Visit (INDEPENDENT_AMBULATORY_CARE_PROVIDER_SITE_OTHER): Payer: Medicare Other | Admitting: Adult Health

## 2018-05-13 ENCOUNTER — Encounter: Payer: Self-pay | Admitting: Adult Health

## 2018-05-13 VITALS — BP 114/64 | HR 112 | Ht 65.5 in | Wt 206.0 lb

## 2018-05-13 DIAGNOSIS — Z5181 Encounter for therapeutic drug level monitoring: Secondary | ICD-10-CM

## 2018-05-13 DIAGNOSIS — J9611 Chronic respiratory failure with hypoxia: Secondary | ICD-10-CM | POA: Diagnosis not present

## 2018-05-13 DIAGNOSIS — I6529 Occlusion and stenosis of unspecified carotid artery: Secondary | ICD-10-CM

## 2018-05-13 DIAGNOSIS — I5032 Chronic diastolic (congestive) heart failure: Secondary | ICD-10-CM | POA: Diagnosis not present

## 2018-05-13 DIAGNOSIS — J449 Chronic obstructive pulmonary disease, unspecified: Secondary | ICD-10-CM

## 2018-05-13 DIAGNOSIS — J849 Interstitial pulmonary disease, unspecified: Secondary | ICD-10-CM

## 2018-05-13 LAB — COMPREHENSIVE METABOLIC PANEL
ALT: 16 U/L (ref 0–35)
AST: 21 U/L (ref 0–37)
Albumin: 3.7 g/dL (ref 3.5–5.2)
Alkaline Phosphatase: 49 U/L (ref 39–117)
BUN: 16 mg/dL (ref 6–23)
CO2: 33 mEq/L — ABNORMAL HIGH (ref 19–32)
Calcium: 8.9 mg/dL (ref 8.4–10.5)
Chloride: 100 mEq/L (ref 96–112)
Creatinine, Ser: 0.88 mg/dL (ref 0.40–1.20)
GFR: 66.86 mL/min (ref >60.00)
Glucose, Bld: 152 mg/dL — ABNORMAL HIGH (ref 70–99)
Potassium: 4 mEq/L (ref 3.5–5.1)
Sodium: 140 mEq/L (ref 135–145)
Total Bilirubin: 0.4 mg/dL (ref 0.2–1.2)
Total Protein: 6.5 g/dL (ref 6.0–8.3)

## 2018-05-13 NOTE — Addendum Note (Signed)
Addended by: Suzzanne Cloud E on: 05/13/2018 02:51 PM   Modules accepted: Orders

## 2018-05-13 NOTE — Progress Notes (Signed)
Reviewed, agree 

## 2018-05-13 NOTE — Progress Notes (Signed)
_0  ID: Destiny Shelton, female    DOB: [birth year], 72 y.o.   MRN: [id number]  Chief Complaint  Patient presents with  . Follow-up    COPD     Referring provider: Marton Redwood, MD  HPI: 72 year old female former smoker followed for COPD, pulmonary hypertension and ILD, O2 RF And Chronic Bronchitis with chronic Pseudomonas Infection   TEST /Events   06/2016 HRCT Chest >Fibrotic interstitial lung disease characterized by patchy peribronchovascular and subpleural reticulation, mild traction bronchiectasis, mild architectural distortion, mosaic attenuation with air trapping and scattered predominantly centrilobular nodularity. No frank honeycombing. No clear basilar gradient. Findings have progressed compared to 2020-09-411 chest CT, with minimal interval progression since 01/22/2016. Findings are favored represent chronic hypersensitivity pneumonitis. Findings are not consistent with usual interstitial pneumonia (UIP). 3. Mild centrilobular and paraseptal emphysema with mild diffuse bronchial wall thickening, suggesting COPD . pfts 2011: FEV1 1.34 (55%), ratio 68, ++airtrapping on lung volumes, nl TLC, DLCO 40% pred. PFT's 03/23/2015 FEV1 1.61 (68 % ) ratio 69 p no % improvement from saba on Symb/spiriva  Echo 2012: nl LV and EF, impaired relaxation, nothing to suggest pulm htn. Myoview 2012: no ischemia LHC 12/2011: non-obstructive cad RHC 12/2011: PA 45/29 with mean 48m, PCWP 22, CO Fick 10.25, PVR 1.17 wood units, slight step up in saturation from RV to PA RWink7/2013: Equal 4 chamber pressures, but no square root sign.  Echo 77/25/3664 Nl LV, diastolic dysfxn, normal RV, estimated RVSP 46.  TEE 03/2012: A few late bubbles seen felt c/w small IP shunting.  CT chest 12/2011: No PE, very mild mosaic perfusion abnl (prob secondary to her known airflow obstruction).  Cardiac MRI 2013: No infiltrative process.  TCD bubble study 03/2012: +  for medium intracardiac right to left shunt.  07/04/2015 RHC: RA mean 9 RV 45/10 PA 41/20, mean 30 PCWP mean 13 Oxygen saturations: SVC 68% RA 65% RV 61% PA 69% LA 98% Peripheral sat 93% Cardiac Output (Fick) 6.7 Cardiac Index (Fick) 3.3 PVR 2.5 WU Qp/Qs = 0.97 06/2016 HRCT>Upper lobe predominant fibrotic change and with centrilobular emphysema, no honeycombing January 2018 pulmonary function testing ratio 70%, FEV1 1.63 L 70% predicted, FVC 2.33 L 76% predicted, total lung capacity 4.48 L 86% predicted, DLCO 5. 09/06/2019 percent predicted BAL 07/2016 >+few psuedomonas-pan sens Daily azithromycin started 2018 Chronic steroids with prednisone 5 mg daily to January 2019 Echo: 12/2017 TTE: LVEF 55%, no LVH, mild RV dilation, no PA pressure estimate March 2019 started on tobramycin nebulizer twice daily every other month 03/2018 Trial of Brovana /Pulmicort neb , no benefit, changed back to Symbicort    05/13/2018 Follow up: COPD /ILD/O2 RF /Pulmonary HTN  Patient presents for a 6-week follow-up.  Patient has underlying COPD and interstitial lung disease.  She is on a very aggressive COPD regimen with Symbicort and Spiriva.  Prednisone 5 mg daily and daily azithromycin she uses albuterol nebulizers twice daily. EKG in Oct qtc ok at .463.  Says overall she thinks she is a little better, not as short of breath and has a little more energy  No flare in cough or wheezing   Patient has chronic bronchitis with Pseudomonas.  She was started on tobramycin nebs twice daily every other month. She took this the Month of November . Insurance has denied future prescriptions , appeal is pending .   Patient is oxygen dependent with high flow demands.  She is on 5 L at rest and 8 L with activity No change  in demand  Patient has underlying pulmonary hypertension.  She is on torsemide 80 in the morning and 40 mg in the evening. Leg swelling has been better.    Allergies  Allergen Reactions  . Aspirin  Shortness Of Breath  . Nsaids Shortness Of Breath    Immunization History  Administered Date(s) Administered  . Influenza Split 02/17/2011, 02/19/2012, 02/24/2015  . Influenza, High Dose Seasonal PF 03/10/2016, 03/26/2017, 03/04/2018  . Influenza,inj,Quad PF,6+ Mos 03/08/2013  . Influenza-Unspecified 01/31/2014  . Pneumococcal Conjugate-13 08/18/2008    Past Medical History:  Diagnosis Date  . Adenomatous colon polyp   . ALLERGIC RHINITIS   . Anemia    iron deficient  . Anxiety   . Atrial tachycardia (HCC)    Mostly Sinus Tachycardia  . Back pain, chronic   . Carotid stenosis   . CHF (congestive heart failure) ([place])   . Community acquired pneumonia - Recent Admission (07/2013) 07/08/2013  . COPD (chronic obstructive pulmonary disease) ([place])    Requiring Home O2 at 4L  . Depression   . Diabetes mellitus type 2 with neurological manifestations ([place])    On Insulin  . Diverticulosis of colon   . External hemorrhoids   . Fibromyalgia    "pain in arms and shoulders."  . Gastritis   . GERD (gastroesophageal reflux disease)   . Headache(784.0)    tension  . Hyperlipemia   . Hypertension   . Inappropriate sinus tachycardia   . Morbidly obese ([place])   . Nephrolithiasis   . Neuromuscular disorder ([place])    diabetic neuropathy  . Osteoarthritis   . Osteoporosis   . PAF (paroxysmal atrial fibrillation) ([place])   . 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  . Sleep apnea    no cpap machine  02 at 4l/min all the time  . Urosepsis 05/26/2012    Tobacco History: Social History   Tobacco Use  Smoking Status Former Smoker  . Packs/day: 2.00  . Years: 30.00  . Pack years: 60.00  . Types: Cigarettes  . Last attempt to quit: 06/02/1994  . Years since quitting: 23.9  Smokeless Tobacco Never Used   Counseling given: Not Answered   Outpatient Medications Prior to Visit  Medication Sig Dispense Refill  . acetaminophen (TYLENOL) 500 MG tablet Take 1 tablet (500 mg total) by mouth every 6 (six)  hours as needed for mild pain or headache. 30 tablet 0  . albuterol (PROVENTIL) (2.5 MG/3ML) 0.083% nebulizer solution USE 1 VIAL VIA NEBULIZER EVERY 6 HOURS AS NEEDED FOR WHEEZING OR SHORTNESS OF BREATH 120 mL 5  . ALPRAZolam (XANAX) 0.5 MG tablet Take 0.25-0.5 mg by mouth daily as needed for anxiety.   4  . atorvastatin (LIPITOR) 40 MG tablet Take 40 mg by mouth at bedtime.     Marland Kitchen azithromycin (ZITHROMAX) 250 MG tablet Take 250 mg by mouth daily.    . benzonatate (TESSALON) 200 MG capsule Take 400 mg by mouth at bedtime.     Marland Kitchen buPROPion (WELLBUTRIN XL) 300 MG 24 hr tablet Take 300 mg by mouth daily.    . DULoxetine (CYMBALTA) 60 MG capsule Take 60 mg by mouth daily.     Marland Kitchen gabapentin (NEURONTIN) 600 MG tablet Take 600 mg by mouth 3 (three) times daily.     . Guaifenesin (MUCINEX MAXIMUM STRENGTH) 1200 MG TB12 Take 1,200 mg by mouth 2 (two) times daily as needed (for cough or congestion).     Marland Kitchen HYDROcodone-acetaminophen (NORCO/VICODIN) 5-325 MG per tablet Take 1 tablet by  mouth See admin instructions. EVERY 4-6 HOURS AS NEEDED FOR PAIN OR SHORTNESS OF BREATH    . insulin aspart (NOVOLOG FLEXPEN) 100 UNIT/ML FlexPen Inject 5 Units into the skin 3 (three) times daily as needed for high blood sugar.    . insulin glargine (LANTUS) 100 UNIT/ML injection Inject 0.66 mLs (66 Units total) into the skin daily before breakfast. (Patient taking differently: Inject 64 Units into the skin daily before breakfast. )    . iron polysaccharides (NIFEREX) 150 MG capsule Take 1 capsule (150 mg total) by mouth 2 (two) times daily. (Patient taking differently: Take 150 mg by mouth daily. ) 60 capsule 0  . levalbuterol (XOPENEX HFA) 45 MCG/ACT inhaler Inhale 1-2 puffs into the lungs every 4 (four) hours as needed for wheezing. (Patient taking differently: Inhale 2 puffs into the lungs every 4 (four) hours as needed for wheezing or shortness of breath. ) 1 Inhaler 12  . metolazone (ZAROXOLYN) 5 MG tablet Take 5 mg by mouth  daily as needed.    . Multiple Vitamin (MULTIVITAMIN WITH MINERALS) TABS tablet Take 1 tablet by mouth daily.    Marland Kitchen omeprazole (PRILOSEC OTC) 20 MG tablet Take 20 mg by mouth at bedtime.    . OXYGEN Inhale 5-8 L into the lungs See admin instructions. 5 liters when at rest and 8 liters when ambulatory    . polyethylene glycol (MIRALAX / GLYCOLAX) packet Take 17 g by mouth daily. Mix in 8 oz liquid and drink    . polyvinyl alcohol (ARTIFICIAL TEARS) 1.4 % ophthalmic solution Place 1 drop into both eyes 2 (two) times daily.     . potassium chloride (K-DUR,KLOR-CON) 10 MEQ tablet Take 3 tablets (30 mEq total) by mouth 2 (two) times daily. 180 tablet 5  . predniSONE (DELTASONE) 5 MG tablet TAKE 1 TABLET(5 MG) BY MOUTH DAILY WITH BREAKFAST 30 tablet 3  . roflumilast (DALIRESP) 500 MCG TABS tablet Take 500 mcg by mouth daily.    Marland Kitchen SPIRIVA HANDIHALER 18 MCG inhalation capsule INHALE THE CONTENTS OF 1 CAPSULE VIA HANDIHALER BY MOUTH EVERY MORNING 30 capsule 5  . SYMBICORT 160-4.5 MCG/ACT inhaler INHALE 2 PUFFS BY MOUTH TWICE DAILY 10.2 g 5  . torsemide (DEMADEX) 20 MG tablet Take 80 mg (4 tabs) in am and 40 mg (2 tabs) in pm 180 tablet 11  . verapamil (VERELAN PM) 360 MG 24 hr capsule Take 1 capsule (360 mg total) by mouth daily. 30 capsule 3  . XARELTO 20 MG TABS tablet Take 20 mg by mouth daily with supper.  6  . tobramycin, PF, (TOBI) 300 MG/5ML nebulizer solution Take 5 mLs (300 mg total) by nebulization 2 (two) times daily. Every other month DX: J44.9 (Patient not taking: Reported on 05/13/2018) 300 mL 5   No facility-administered medications prior to visit.      Review of Systems  Constitutional:   No  weight loss, night sweats,  Fevers, chills,  +fatigue, or  lassitude.  HEENT:   No headaches,  Difficulty swallowing,  Tooth/dental problems, or  Sore throat,                No sneezing, itching, ear ache,  +nasal congestion, post nasal drip,   CV:  No chest pain,  Orthopnea, PND, swelling in  lower extremities, anasarca, dizziness, palpitations, syncope.   GI  No heartburn, indigestion, abdominal pain, nausea, vomiting, diarrhea, change in bowel habits, loss of appetite, bloody stools.   Resp:   No chest wall  deformity  Skin: no rash or lesions.  GU: no dysuria, change in color of urine, no urgency or frequency.  No flank pain, no hematuria   MS:  No joint pain or swelling.  No decreased range of motion.  No back pain.    Physical Exam  BP 114/64 (BP Location: Left Arm, Cuff Size: Normal)   Pulse (!) 112   Ht 5' 5.5" (1.664 m)   Wt 206 lb (93.4 kg)   SpO2 90%   BMI 33.76 kg/m   GEN: A/Ox3; pleasant , NAD, elderly , in wc on O2    HEENT:  [place]/AT,  EACs-clear, TMs-wnl, NOSE-clear, THROAT-clear, no lesions, no postnasal drip or exudate noted.   NECK:  Supple w/ fair ROM; no JVD; normal carotid impulses w/o bruits; no thyromegaly or nodules palpated; no lymphadenopathy.    RESP  Decreased BS in bases  no accessory muscle use, no dullness to percussion  CARD:  RRR, no m/r/g, no peripheral edema, pulses intact, no cyanosis or clubbing.  GI:   Soft & nt; nml bowel sounds; no organomegaly or masses detected.   Musco: Warm bil, no deformities or joint swelling noted.   Neuro: alert, no focal deficits noted.    Skin: Warm, no lesions or rashes    Lab Results:  CBC    Component Value Date/Time   WBC 9.7 09/07/2017 1446   RBC 3.89 09/07/2017 1446   HGB 10.6 (L) 09/07/2017 1446   HCT 35.7 (L) 09/07/2017 1446   PLT 311 09/07/2017 1446   MCV 91.8 09/07/2017 1446   MCH 27.2 09/07/2017 1446   MCHC 29.7 (L) 09/07/2017 1446   RDW 15.2 09/07/2017 1446   LYMPHSABS 1.3 03/02/2017 1200   MONOABS 1.0 03/02/2017 1200   EOSABS 0.0 03/02/2017 1200   BASOSABS 0.0 03/02/2017 1200    BMET    Component Value Date/Time   NA 144 01/25/2018 1430   K 4.6 01/25/2018 1430   CL 101 01/25/2018 1430   CO2 30 01/25/2018 1430   GLUCOSE 145 (H) 01/25/2018 1430   BUN 12  01/25/2018 1430   CREATININE 0.94 01/25/2018 1430   CREATININE 0.85 02/12/2015 0949   CALCIUM 9.0 01/25/2018 1430   GFRNONAA 59 (L) 01/25/2018 1430   GFRAA >60 01/25/2018 1430    BNP    Component Value Date/Time   BNP 21.2 01/29/2017 0544    ProBNP    Component Value Date/Time   PROBNP 42 11/25/2016 1518   PROBNP 316.0 (H) 07/08/2013 1425    Imaging: No results found.    PFT Results Latest Ref Rng & Units 06/15/2017 06/26/2016 03/23/2015  FVC-Pre L 2.29 2.30 2.42  FVC-Predicted Pre % 75 75 77  FVC-Post L 2.28 2.33 2.34  FVC-Predicted Post % 75 76 75  Pre FEV1/FVC % % 57 65 67  Post FEV1/FCV % % 66 70 69  FEV1-Pre L 1.30 1.50 1.61  FEV1-Predicted Pre % 57 65 68  FEV1-Post L 1.50 1.63 1.61  DLCO UNC% % 20 21 -  DLCO COR %Predicted % 26 29 -  TLC L 5.00 4.48 4.67  TLC % Predicted % 96 86 89  RV % Predicted % 126 100 111    No results found for: NITRICOXIDE      Assessment & Plan:   COPD (chronic obstructive pulmonary disease) (HCC) Severe COPD steroid and Oxygen  Recent EKG w/ nml QTc  Check bmet/lft   Plan  Patient Instructions  Continue on Symbiocrt 2 puffs Twice daily ,  rinse after use Continue on Prednisone 12m daily .  Continue on daily Azithromycin .  Labs today .  Continue on Albuterol Neb Twice daily  .  Continue on Spiriva daily  Continue on schedule of  Tobramycin Neb Twice daily  (every other month -so will be off December 2019, restart January 06/2018..Marland KitchenMarland KitchenMarland Kitchen  Continue on Oxygen 5 l/m rest and 8 l/m activity .  Follow up with Dr. MLake Bellsin 6-8 weeks and As needed        ILD (interstitial lung disease) ([place] Stable without flare   Plan  Patient Instructions  Continue on Symbiocrt 2 puffs Twice daily , rinse after use Continue on Prednisone 582mdaily .  Continue on daily Azithromycin .  Labs today .  Continue on Albuterol Neb Twice daily  .  Continue on Spiriva daily  Continue on schedule of  Tobramycin Neb Twice daily  (every other  month -so will be off December 2019, restart January 06/2018...Marland KitchenMarland KitchenMarland Kitchen Continue on Oxygen 5 l/m rest and 8 l/m activity .  Follow up with Dr. McLake Bellsn 6-8 weeks and As needed        Chronic respiratory failure ([facility] O2   Plan  Patient Instructions  Continue on Symbiocrt 2 puffs Twice daily , rinse after use Continue on Prednisone 69m369maily .  Continue on daily Azithromycin .  Labs today .  Continue on Albuterol Neb Twice daily  .  Continue on Spiriva daily  Continue on schedule of  Tobramycin Neb Twice daily  (every other month -so will be off December 2019, restart January 06/2018....Marland KitchenMarland KitchenMarland KitchenContinue on Oxygen 5 l/m rest and 8 l/m activity .  Follow up with Dr. McQLake Bells 6-8 weeks and As needed      '  Chronic diastolic heart failure of unknown etiology (HCCViningont on current regimen      TamRexene EdisonP 05/13/2018

## 2018-05-13 NOTE — Assessment & Plan Note (Signed)
Stable without flare   Plan  Patient Instructions  Continue on Symbiocrt 2 puffs Twice daily , rinse after use Continue on Prednisone 5mg  daily .  Continue on daily Azithromycin .  Labs today .  Continue on Albuterol Neb Twice daily  .  Continue on Spiriva daily  Continue on schedule of  Tobramycin Neb Twice daily  (every other month -so will be off December 2019, restart January 06/2018.Marland KitchenMarland KitchenMarland Kitchen)  Continue on Oxygen 5 l/m rest and 8 l/m activity .  Follow up with Dr. Lake Bells in 6-8 weeks and As needed

## 2018-05-13 NOTE — Assessment & Plan Note (Signed)
Cont on O2   Plan  Patient Instructions  Continue on Symbiocrt 2 puffs Twice daily , rinse after use Continue on Prednisone 5mg  daily .  Continue on daily Azithromycin .  Labs today .  Continue on Albuterol Neb Twice daily  .  Continue on Spiriva daily  Continue on schedule of  Tobramycin Neb Twice daily  (every other month -so will be off December 2019, restart January 06/2018.Marland KitchenMarland KitchenMarland Kitchen)  Continue on Oxygen 5 l/m rest and 8 l/m activity .  Follow up with Dr. Lake Bells in 6-8 weeks and As needed      '

## 2018-05-13 NOTE — Telephone Encounter (Signed)
Forms were completed/signed by Dr. Lake Bells and have been faxed. Nothing further needed at this time.

## 2018-05-13 NOTE — Assessment & Plan Note (Addendum)
Severe COPD steroid and Oxygen  Recent EKG w/ nml QTc  Check bmet/lft   Plan  Patient Instructions  Continue on Symbiocrt 2 puffs Twice daily , rinse after use Continue on Prednisone 5mg  daily .  Continue on daily Azithromycin .  Labs today .  Continue on Albuterol Neb Twice daily  .  Continue on Spiriva daily  Continue on schedule of  Tobramycin Neb Twice daily  (every other month -so will be off December 2019, restart January 06/2018.Marland KitchenMarland KitchenMarland Kitchen)  Continue on Oxygen 5 l/m rest and 8 l/m activity .  Follow up with Dr. Lake Bells in 6-8 weeks and As needed

## 2018-05-13 NOTE — Assessment & Plan Note (Signed)
Cont on current regimen  

## 2018-05-13 NOTE — Patient Instructions (Addendum)
Continue on Symbiocrt 2 puffs Twice daily , rinse after use Continue on Prednisone 5mg  daily .  Continue on daily Azithromycin .  Labs today .  Continue on Albuterol Neb Twice daily  .  Continue on Spiriva daily  Continue on schedule of  Tobramycin Neb Twice daily  (every other month -so will be off December 2019, restart January 06/2018.Marland KitchenMarland KitchenMarland Kitchen)  Continue on Oxygen 5 l/m rest and 8 l/m activity .  Follow up with Dr. Lake Bells in 6-8 weeks and As needed

## 2018-05-19 DIAGNOSIS — H11153 Pinguecula, bilateral: Secondary | ICD-10-CM | POA: Diagnosis not present

## 2018-05-19 DIAGNOSIS — Z9849 Cataract extraction status, unspecified eye: Secondary | ICD-10-CM | POA: Diagnosis not present

## 2018-05-19 DIAGNOSIS — Z961 Presence of intraocular lens: Secondary | ICD-10-CM | POA: Diagnosis not present

## 2018-05-19 DIAGNOSIS — H04123 Dry eye syndrome of bilateral lacrimal glands: Secondary | ICD-10-CM | POA: Diagnosis not present

## 2018-05-19 DIAGNOSIS — Z7984 Long term (current) use of oral hypoglycemic drugs: Secondary | ICD-10-CM | POA: Diagnosis not present

## 2018-05-19 DIAGNOSIS — E119 Type 2 diabetes mellitus without complications: Secondary | ICD-10-CM | POA: Diagnosis not present

## 2018-05-19 DIAGNOSIS — H18413 Arcus senilis, bilateral: Secondary | ICD-10-CM | POA: Diagnosis not present

## 2018-05-19 DIAGNOSIS — H40023 Open angle with borderline findings, high risk, bilateral: Secondary | ICD-10-CM | POA: Diagnosis not present

## 2018-05-19 DIAGNOSIS — H40003 Preglaucoma, unspecified, bilateral: Secondary | ICD-10-CM | POA: Diagnosis not present

## 2018-05-19 DIAGNOSIS — Z794 Long term (current) use of insulin: Secondary | ICD-10-CM | POA: Diagnosis not present

## 2018-06-04 ENCOUNTER — Other Ambulatory Visit: Payer: Self-pay | Admitting: Pulmonary Disease

## 2018-06-04 NOTE — Telephone Encounter (Signed)
We received this refill request. I do not see any notes regarding the patient staying on this medication. Would you like [place] to refill this medication?

## 2018-06-15 ENCOUNTER — Encounter (HOSPITAL_COMMUNITY): Payer: Medicare Other | Admitting: Cardiology

## 2018-06-15 NOTE — Progress Notes (Signed)
Patient ID: Destiny Shelton, female   DOB: 12-20-1945, 73 y.o.   MRN: [id number]   PCP: Dr. Brigitte Pulse HF Cardiology: Dr. Keturah Shavers is a 73 y.o. female with history of paroxysmal atrial fibrillation, chronic diastolic CHF with restrictive hemodynamics on 2013 RHC, COPD on home oxygen, inappropriate sinus tachycardia, and concern for intracardiac shunting presents for followup of diastolic CHF.  She has been followed for several years by both cardiology and pulmonology.  She is a prior smoker and has been diagnosed with COPD.  Interestingly, her PFTs in 10/16 showed improvement, suggesting mild obstructive airways disease.  However, she still requires home oxygen.  She had transcranial dopplers in 2013 suggesting a medium-sized right to left shunt.  This led to an extensive workup.  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  Cardiac MRI was unremarkable, no evidence for shunt lesion.  RHC/LHC showed no coronary disease but it did show restrictive hemodynamics and volume overload.  Qp/Qs from this study was 1.27/1, suggesting a relatively small left to right shunt.  A definite shunt lesion was never identified.    Echo in 2/17 showed normal LV size and systolic function and normal RV.  I also did a RHC in 2/17: on higher dose diuretics, she had mild pulmonary hypertension and normal PCWP.  There was no evidence for a shunt lesion with Qp/Qs 0.97.   She was admitted in 9/17 with acute exacerbation of COPD.  She was treated with steroids and nebs.  She was admitted in 12/17 with suspected COPD exacerbation, treated with steroids and levofloxacin.  Lasix was decreased to 80 mg once daily but I increased it back to 80 qam/40 qpm when I saw her in followup, later up to Lasix 80 mg bid.  Subsequently, metolazone was started twice a week.   In 8/18, she was admitted with PNA and severe epistaxis.  She was in the ER again with epistaxis in 10/18.  She subsequently saw an ENT with  cautery done in her nose.  She has had only 1 episode of epistaxis since then and is on Xarelto still.    Most recent echo in 8/19 showed EF 55%, mild RV dilation with normal systolic function, D-shaped interventricular septum, unable to estimate PA systolic pressure.   She presents today for follow up. Last visit was in the midst of a COPD exacerbation. Has since followed up with pulmonary and had her breathing therapies adjusted several times to no perceivable benefit. She remains dyspneic with any exertion, and with high flow O2 requirement of 5L at rest and 8 L with exertion. O2 sat dropped to 77% standing up to weigh, but did not turn up O2 to 8L. Denies fever. Occasional mild peripheral edema. She has not needed any extra torsemide. Denies orthopnea or PND. No chest pain. Occasional lightheadedness with rapid standing but not marked or limiting. Taking all medications as directed.   Labs (9/16): digoxin 0.6, K 4.3, creatinine 0.85 Labs (1/17): K 3.9, creatinine 0.96, hgb 8.9 Labs (3/17): K 4.2, creatinine 0.96 Labs (6/17): K 4.2, creatinine 1.03 Labs (8/17): K 4.1, creatinine 0.81, HCT 34.3 Labs (9/17): K 3.7, creatinine 0.91, hgb 12.1 Labs (12/17): K 3.4, creatinine 0.84, hgb 13 Labs (1/18): K 4.2, creatinine 1.27 Labs (6/18): pro-BNP 42 Labs (7/18): K 4.5, creatinine 0.93 Labs (10/18): K 3.8, creatinine 1.38, hgb 8.1 Labs (12/18): K 4.4, creatinine 1.11, hgb 8.4 Labs (4/19): K 4.2, creatinine 0.92, hgb 10.6 Labs (8/19): K 4.6, creatinine  0.94  ECG (personally reviewed): Sinus tachycardia with PVC  PMH: 1. Atrial fibrillation: Paroxysmal.  2. HTN 3. COPD: Home oxygen.  PFTs (10/16) with FVC 77%, FEV1 68%, TLC 89%.  Now severe.  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  4. Type II diabetes 5. Obesity 6. Inappropriate sinus tachycardia 7. OSA: Does not use CPAP, uses oxygen at night.  8. Chronic diastolic CHF: RHC/LHC in 7/98 with no significant CAD, mean RA 19, RV 46/18, PA 45/29  mean 37, mean PCWP 22, CI 4.1, PVR 1.4 WU => restrictive hemodynamics with no evidence for constriction;  Qp/Qs 1.27/1; saturation run difficult to interpret.  TEE (10/13): EF 55-60%, LVH, mild MR, bubble study showed no evidence for immediate R=>L bubble crossing, small amount of bubbles ended up in left heart > 5 seconds after injection suggesting possible intrapulmonary shunting; no shunt by color doppler.  Cardiac MRI (8/13) with EF 59%, no LGE, normal RV size/systolic function => interpreted as normal.  RHC 07/04/2015: RA mean 9, RV 45/10, PA 41/20 mean 30, PCWP mean 13, no step up on shunt run,cardiac output (Fick) 6.7 cardiac index (Fick) 3.3, PVR 2.5 WU, Qp/Qs = 0.97. Echo (2/17) with EF 55-60%, mild LVH, mild MR, normal RV size and systolic function.  - Echo (8/19): EF 55%, mild RV dilation with normal systolic function, D-shaped interventricular septum, unable to estimate PA systolic pressure.  9. ?Intracardiac shunt: Transcranial dopplers in 10/13 were suggestive of medium-sized right to left shunt.  Bubble study on TEE in 10/13 showed no evidence for immediate R=>L bubble crossing, small amount of bubbles ended up in left heart > 5 seconds after injection suggesting possible intrapulmonary shunting; no shunt by color doppler. Cardiac MRI showed no evidence for shunting.  Cardiac cath in 2013 actually was suggestive of a small left to right shunt.  Cardiac cath in 2/17 NOT suggestive of significant shunt, Qp/Qs 0.97.  10. Interstitial lung disease: HRCT in 1/18 was concerning for ILD, possibly scarring from prior respiratory infections.   SH: Married, prior heavy smoker (quit 1996).  No ETOH.   Family History  Problem Relation Age of Onset  . Heart disease Mother   . Stroke Mother   . Heart disease Father   . Heart disease Brother   . Stroke Brother   . Skin cancer Brother   . Colon polyps Sister   . Diabetes Sister   . Diabetes Brother   . Irritable bowel syndrome Daughter   . Colon  cancer Neg Hx    Review of systems complete and found to be negative unless listed in HPI.    Current Outpatient Medications  Medication Sig Dispense Refill  . acetaminophen (TYLENOL) 500 MG tablet Take 1 tablet (500 mg total) by mouth every 6 (six) hours as needed for mild pain or headache. 30 tablet 0  . albuterol (PROVENTIL) (2.5 MG/3ML) 0.083% nebulizer solution USE 1 VIAL VIA NEBULIZER EVERY 6 HOURS AS NEEDED FOR WHEEZING OR SHORTNESS OF BREATH 120 mL 5  . ALPRAZolam (XANAX) 0.5 MG tablet Take 0.25-0.5 mg by mouth daily as needed for anxiety.   4  . atorvastatin (LIPITOR) 40 MG tablet Take 40 mg by mouth at bedtime.     Marland Kitchen azithromycin (ZITHROMAX) 250 MG tablet Take 250 mg by mouth daily.    . benzonatate (TESSALON) 200 MG capsule Take 400 mg by mouth at bedtime.     Marland Kitchen buPROPion (WELLBUTRIN XL) 300 MG 24 hr tablet Take 300 mg by mouth daily.    Marland Kitchen  DULoxetine (CYMBALTA) 60 MG capsule Take 60 mg by mouth daily.     Marland Kitchen gabapentin (NEURONTIN) 600 MG tablet Take 600 mg by mouth 3 (three) times daily.     . Guaifenesin (MUCINEX MAXIMUM STRENGTH) 1200 MG TB12 Take 1,200 mg by mouth 2 (two) times daily as needed (for cough or congestion).     Marland Kitchen HYDROcodone-acetaminophen (NORCO/VICODIN) 5-325 MG per tablet Take 1 tablet by mouth See admin instructions. EVERY 4-6 HOURS AS NEEDED FOR PAIN OR SHORTNESS OF BREATH    . insulin aspart (NOVOLOG FLEXPEN) 100 UNIT/ML FlexPen Inject 5 Units into the skin 3 (three) times daily as needed for high blood sugar.    . insulin glargine (LANTUS) 100 UNIT/ML injection Inject 0.66 mLs (66 Units total) into the skin daily before breakfast. (Patient taking differently: Inject 64 Units into the skin daily before breakfast. )    . iron polysaccharides (NIFEREX) 150 MG capsule Take 1 capsule (150 mg total) by mouth 2 (two) times daily. (Patient taking differently: Take 150 mg by mouth daily. ) 60 capsule 0  . levalbuterol (XOPENEX HFA) 45 MCG/ACT inhaler Inhale 1-2 puffs into  the lungs every 4 (four) hours as needed for wheezing. (Patient taking differently: Inhale 2 puffs into the lungs every 4 (four) hours as needed for wheezing or shortness of breath. ) 1 Inhaler 12  . metolazone (ZAROXOLYN) 5 MG tablet Take 5 mg by mouth daily as needed.    . Multiple Vitamin (MULTIVITAMIN WITH MINERALS) TABS tablet Take 1 tablet by mouth daily.    Marland Kitchen omeprazole (PRILOSEC OTC) 20 MG tablet Take 20 mg by mouth at bedtime.    . OXYGEN Inhale 5-8 L into the lungs See admin instructions. 5 liters when at rest and 8 liters when ambulatory    . polyethylene glycol (MIRALAX / GLYCOLAX) packet Take 17 g by mouth daily. Mix in 8 oz liquid and drink    . polyvinyl alcohol (ARTIFICIAL TEARS) 1.4 % ophthalmic solution Place 1 drop into both eyes 2 (two) times daily.     . potassium chloride (K-DUR,KLOR-CON) 10 MEQ tablet Take 3 tablets (30 mEq total) by mouth 2 (two) times daily. 180 tablet 5  . predniSONE (DELTASONE) 5 MG tablet TAKE 1 TABLET(5 MG) BY MOUTH DAILY WITH BREAKFAST 30 tablet 3  . roflumilast (DALIRESP) 500 MCG TABS tablet Take 500 mcg by mouth daily.    . sodium chloride HYPERTONIC 3 % nebulizer solution USE 1 VIAL IN NEBULIZER TWO TIMES DAILY. 240 mL 3  . SPIRIVA HANDIHALER 18 MCG inhalation capsule INHALE THE CONTENTS OF 1 CAPSULE VIA HANDIHALER BY MOUTH EVERY MORNING 30 capsule 5  . SYMBICORT 160-4.5 MCG/ACT inhaler INHALE 2 PUFFS BY MOUTH TWICE DAILY 10.2 g 5  . tobramycin, PF, (TOBI) 300 MG/5ML nebulizer solution Take 5 mLs (300 mg total) by nebulization 2 (two) times daily. Every other month DX: J44.9 300 mL 5  . torsemide (DEMADEX) 20 MG tablet Take 80 mg (4 tabs) in am and 40 mg (2 tabs) in pm 180 tablet 11  . verapamil (VERELAN PM) 360 MG 24 hr capsule Take 1 capsule (360 mg total) by mouth daily. 30 capsule 3  . XARELTO 20 MG TABS tablet Take 20 mg by mouth daily with supper.  6   No current facility-administered medications for this encounter.    Vitals:   06/16/18  1131 06/16/18 1150  BP: 128/66   Pulse: (!) 120 (!) 110  SpO2: (!) 77% (!) 82%  Weight:  94.5 kg (208 lb 6.4 oz)    Wt Readings from Last 3 Encounters:  06/16/18 94.5 kg (208 lb 6.4 oz)  05/13/18 93.4 kg (206 lb)  04/01/18 94.3 kg (208 lb)    General: NAD.  HEENT: Normal Neck: Supple. JVP 6-7 cm. Carotids 2+ bilat; no bruits. No thyromegaly or nodule noted. Cor: PMI nondisplaced. RRR, No M/G/R noted Lungs: Severely diminished throughout.  Abdomen: Soft, non-tender, non-distended, no HSM. No bruits or masses. +BS  Extremities: No cyanosis, clubbing, or rash. R and LLE no edema.  Neuro: Alert & orientedx3, cranial nerves grossly intact. moves all 4 extremities w/o difficulty. Affect pleasant   Assessment/Plan: 1. COPD: On home oxygen, 5L at rest and 8L with exertion.  Severe COPD, Pseudomonas colonization.  Breathing has improved in the past with increased diuresis, suggesting that CHF plays a significant role in her symptoms in addition to COPD.  She has ILD on CT that may be due to chronic scarring from prior respiratory infections.   - Seen by Pulmonary 05/13/18. Meds adjusted.  - She is on inhaled tobramycin for Pseudomonas colonization.  - She is on Daliresp.  - Unfortunately, she continues to have very high oxygen demand with frequent de-saturations. Volume status stable on exam, so suspect this primarily pulmonary driven.  2. Atrial fibrillation: Paroxysmal, CHADSVASC = 4.  Regular on exam. Today.  - She will continue Xarelto.   - She can continue verapamil.     3. Chronic diastolic CHF: NYHA class IIIb symptoms chronically, likely due to combination of CHF and COPD/ILD. Volume status looks OK today.   - Continue torsemide 80 qam/40 qpm. BMET today.  - Continue metolazone as needed. She has not needed 4. ?Shunt lesion: She had transcranial dopplers in 2013 suggesting a medium-sized right to left shunt.  This led to an extensive workup.  TEE in 2013 showed late bubbles,  suggestive of possible pulmonary AVMs.  Cardiac MRI was unremarkable, no evidence for shunt lesion.  RHC/LHC in 2013 showed no coronary disease but it did show restrictive hemodynamics and volume overload.  Qp/Qs from this study was 1.27/1, suggesting a relatively small left to right shunt.  A definite shunt lesion was never identified.  Based on prior workup, it seems most likely that the right to left shunting by transcranial dopplers and TEE bubble study was due to pulmonary AVMs.  RHC repeated in 2/17 did not show a significant shunt lesion, with Qp/Qs 0.97.  5. Inappropriate sinus tachycardia: Had discussed using Corlanor in the past but interacts with verapamil.  6. Pulmonary hypertension: Mild pulmonary hypertension on RHC with PVR only 2.5 WU.  Probably due to COPD/ILD (group 3) and elevated left atrial pressure (group 2).  Continue diuretics as above.  7. Interstitial lung disease: May be due to lung scarring from prior respiratory infections.   - Followed by Dr. Lake Bells.  Dyspnea gradually worsening over time. Mo change.  8. HTN:  - Meds as above.     Labs today. No change to meds. Needs close follow up with Pulmonary with any worsening.  No obvious signs of current infection. RTC 3 months. Sooner with symptoms of volume overload.    Shirley Friar, PA-C  06/16/2018   Greater than 50% of the 25 minute visit was spent in counseling/coordination of care regarding disease state education, salt/fluid restriction, sliding scale diuretics, and medication compliance.

## 2018-06-16 ENCOUNTER — Encounter (HOSPITAL_COMMUNITY): Payer: Self-pay

## 2018-06-16 ENCOUNTER — Ambulatory Visit (HOSPITAL_COMMUNITY)
Admission: RE | Admit: 2018-06-16 | Discharge: 2018-06-16 | Disposition: A | Discharge status: Home / Self Care | Payer: Medicare Other | Source: Ambulatory Visit | Attending: Cardiology | Admitting: Cardiology

## 2018-06-16 VITALS — BP 128/66 | HR 110 | Wt 208.4 lb

## 2018-06-16 DIAGNOSIS — I272 Pulmonary hypertension, unspecified: Secondary | ICD-10-CM | POA: Diagnosis not present

## 2018-06-16 DIAGNOSIS — I10 Essential (primary) hypertension: Secondary | ICD-10-CM | POA: Diagnosis not present

## 2018-06-16 DIAGNOSIS — R000 Tachycardia, unspecified: Secondary | ICD-10-CM | POA: Insufficient documentation

## 2018-06-16 DIAGNOSIS — J849 Interstitial pulmonary disease, unspecified: Secondary | ICD-10-CM | POA: Insufficient documentation

## 2018-06-16 DIAGNOSIS — Z87891 Personal history of nicotine dependence: Secondary | ICD-10-CM | POA: Insufficient documentation

## 2018-06-16 DIAGNOSIS — J441 Chronic obstructive pulmonary disease with (acute) exacerbation: Secondary | ICD-10-CM | POA: Diagnosis not present

## 2018-06-16 DIAGNOSIS — Z2239 Carrier of other specified bacterial diseases: Secondary | ICD-10-CM | POA: Diagnosis not present

## 2018-06-16 DIAGNOSIS — E119 Type 2 diabetes mellitus without complications: Secondary | ICD-10-CM | POA: Diagnosis not present

## 2018-06-16 DIAGNOSIS — Z7901 Long term (current) use of anticoagulants: Secondary | ICD-10-CM | POA: Insufficient documentation

## 2018-06-16 DIAGNOSIS — Z833 Family history of diabetes mellitus: Secondary | ICD-10-CM | POA: Diagnosis not present

## 2018-06-16 DIAGNOSIS — Z794 Long term (current) use of insulin: Secondary | ICD-10-CM | POA: Insufficient documentation

## 2018-06-16 DIAGNOSIS — Z9981 Dependence on supplemental oxygen: Secondary | ICD-10-CM | POA: Diagnosis not present

## 2018-06-16 DIAGNOSIS — Z79899 Other long term (current) drug therapy: Secondary | ICD-10-CM | POA: Insufficient documentation

## 2018-06-16 DIAGNOSIS — I5032 Chronic diastolic (congestive) heart failure: Secondary | ICD-10-CM | POA: Diagnosis not present

## 2018-06-16 DIAGNOSIS — G4733 Obstructive sleep apnea (adult) (pediatric): Secondary | ICD-10-CM | POA: Insufficient documentation

## 2018-06-16 DIAGNOSIS — I110 Hypertensive heart disease with heart failure: Secondary | ICD-10-CM | POA: Diagnosis not present

## 2018-06-16 DIAGNOSIS — J449 Chronic obstructive pulmonary disease, unspecified: Secondary | ICD-10-CM | POA: Insufficient documentation

## 2018-06-16 DIAGNOSIS — I480 Paroxysmal atrial fibrillation: Secondary | ICD-10-CM | POA: Insufficient documentation

## 2018-06-16 DIAGNOSIS — E669 Obesity, unspecified: Secondary | ICD-10-CM | POA: Diagnosis not present

## 2018-06-16 DIAGNOSIS — Z8249 Family history of ischemic heart disease and other diseases of the circulatory system: Secondary | ICD-10-CM | POA: Insufficient documentation

## 2018-06-16 LAB — CBC
HCT: 37.8 % (ref 36.0–46.0)
Hemoglobin: 10.4 g/dL — ABNORMAL LOW (ref 12.0–15.0)
MCH: 24.2 pg — ABNORMAL LOW (ref 26.0–34.0)
MCHC: 27.5 g/dL — ABNORMAL LOW (ref 30.0–36.0)
MCV: 87.9 fL (ref 80.0–100.0)
Platelets: 327 10*3/uL (ref 150–400)
RBC: 4.3 MIL/uL (ref 3.87–5.11)
RDW: 17.3 % — ABNORMAL HIGH (ref 11.5–15.5)
WBC: 13.4 10*3/uL — ABNORMAL HIGH (ref 4.0–10.5)
nRBC: 0 % (ref 0.0–0.2)

## 2018-06-16 LAB — BASIC METABOLIC PANEL
Anion gap: 10 (ref 5–15)
BUN: 15 mg/dL (ref 8–23)
CALCIUM: 9 mg/dL (ref 8.9–10.3)
CO2: 29 mmol/L (ref 22–32)
Chloride: 101 mmol/L (ref 98–111)
Creatinine, Ser: 0.86 mg/dL (ref 0.44–1.00)
GFR calc Af Amer: >60 mL/min (ref >60)
GFR calc non Af Amer: >60 mL/min (ref >60)
Glucose, Bld: 106 mg/dL — ABNORMAL HIGH (ref 70–99)
Potassium: 4.5 mmol/L (ref 3.5–5.1)
Sodium: 140 mmol/L (ref 135–145)

## 2018-06-16 NOTE — Patient Instructions (Signed)
Labs done today.

## 2018-06-19 ENCOUNTER — Other Ambulatory Visit: Payer: Self-pay | Admitting: Pulmonary Disease

## 2018-07-16 ENCOUNTER — Other Ambulatory Visit: Payer: Self-pay | Admitting: General Surgery

## 2018-07-16 ENCOUNTER — Other Ambulatory Visit: Payer: Self-pay | Admitting: Pulmonary Disease

## 2018-07-27 DIAGNOSIS — Z1231 Encounter for screening mammogram for malignant neoplasm of breast: Secondary | ICD-10-CM | POA: Diagnosis not present

## 2018-07-28 ENCOUNTER — Ambulatory Visit (INDEPENDENT_AMBULATORY_CARE_PROVIDER_SITE_OTHER): Payer: Medicare Other | Admitting: Pulmonary Disease

## 2018-07-28 ENCOUNTER — Telehealth: Payer: Self-pay | Admitting: Pulmonary Disease

## 2018-07-28 ENCOUNTER — Encounter: Payer: Self-pay | Admitting: Pulmonary Disease

## 2018-07-28 VITALS — BP 104/56 | HR 118 | Ht 65.0 in | Wt 190.8 lb

## 2018-07-28 DIAGNOSIS — J9611 Chronic respiratory failure with hypoxia: Secondary | ICD-10-CM

## 2018-07-28 DIAGNOSIS — Z2239 Carrier of other specified bacterial diseases: Secondary | ICD-10-CM

## 2018-07-28 DIAGNOSIS — J849 Interstitial pulmonary disease, unspecified: Secondary | ICD-10-CM

## 2018-07-28 DIAGNOSIS — Z5181 Encounter for therapeutic drug level monitoring: Secondary | ICD-10-CM

## 2018-07-28 DIAGNOSIS — I5032 Chronic diastolic (congestive) heart failure: Secondary | ICD-10-CM | POA: Diagnosis not present

## 2018-07-28 DIAGNOSIS — J449 Chronic obstructive pulmonary disease, unspecified: Secondary | ICD-10-CM

## 2018-07-28 MED ORDER — TOBRAMYCIN 300 MG/5ML IN NEBU: INHALATION_SOLUTION | RESPIRATORY_TRACT | 0 refills | Status: DC

## 2018-07-28 MED ORDER — LEVOFLOXACIN 500 MG PO TABS: 500.0000 mg | ORAL_TABLET | Freq: Every day | ORAL | 0 refills | Status: DC

## 2018-07-28 NOTE — Progress Notes (Signed)
Subjective:    Patient ID: Destiny Shelton, female    DOB: Feb 10, 1946, 73 y.o.   MRN: [id number]  Synopsys: Former patient of Dr. Gwenette Greet with pulmonary hypertension, Afib, and COPD.  She smoked 2 packs per day for 25 years and quit in the 1990's.  She has been on oxygen since 2012.  As of 2017 She has been using 5L O2 at rest and 6 L with exertion.   She does not have a history of PE that she is aware of.  A bronchoscopy in 2018 showed Pseudomonas.  She was started on daily azithromycin in 2018.  She was started on daily prednisone 5 mg daily in January 2019 due to persistent and recurrent exacerbations   HPI Chief Complaint  Patient presents with  . Follow-up    increased SOB, wheezing, chest tightness, cough w/ yellow mucus since LOV, for about 1 month   Salah says that for the last month she has been having increasing chest congestion and shortness of breath.  She has been producing green mucus in the mornings.  No fevers chills or sick contacts.  She has body aches and headache but this is not changed compared to her baseline.  She says that she is having increasing shortness of breath which is quite severe with just minimal activity walking around the house.  Just walking from one room to the next make her short of breath.  She continues to use and benefit from her oxygen.  She keeps taking Symbicort and Spiriva.  She says she is a little confused about what to do with her other nebulized medicines and she cannot always remember the names of them or how she is supposed to take them.  She says that she stopped taking the tobramycin because she did not think it helped her very much.  She continues to take azithromycin daily.  Past Medical History:  Diagnosis Date  . Adenomatous colon polyp   . ALLERGIC RHINITIS   . Anemia    iron deficient  . Anxiety   . Atrial tachycardia (HCC)    Mostly Sinus Tachycardia  . Back pain, chronic   . Carotid stenosis   . CHF (congestive heart failure)  ([place])   . Community acquired pneumonia - Recent Admission (07/2013) 07/08/2013  . COPD (chronic obstructive pulmonary disease) ([place])    Requiring Home O2 at 4L  . Depression   . Diabetes mellitus type 2 with neurological manifestations ([place])    On Insulin  . Diverticulosis of colon   . External hemorrhoids   . Fibromyalgia    "pain in arms and shoulders."  . Gastritis   . GERD (gastroesophageal reflux disease)   . Headache(784.0)    tension  . Hyperlipemia   . Hypertension   . Inappropriate sinus tachycardia   . Morbidly obese ([place])   . Nephrolithiasis   . Neuromuscular disorder ([facility])    diabetic neuropathy  . Osteoarthritis   . Osteoporosis   . PAF (paroxysmal atrial fibrillation) ([place])   . 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  . Sleep apnea    no cpap machine  02 at 4l/min all the time  . Urosepsis 05/26/2012      Review of Systems  Constitutional: Negative for chills, diaphoresis, fatigue and fever.  HENT: Negative for postnasal drip, rhinorrhea and sinus pressure.   Respiratory: Positive for cough, shortness of breath and wheezing.   Cardiovascular: Positive for leg swelling. Negative for chest pain and palpitations.  Objective:   Physical Exam Vitals:   07/28/18 1449  BP: (!) 104/56  Pulse: (!) 118  SpO2: 90%  Weight: 190 lb 12.8 oz (86.5 kg)  Height: _0  (1.651 m)   After resting her O2 saturation returned to 91% RA  8L   Gen: obese, chronically ill appearing HENT: OP clear, TM's clear, neck supple PULM: Crackles bases bilaterally B, normal percussion CV: RRR, no mgr, trace edema GI: BS+, soft, nontender Derm: thin skin, some bruising, minimal edema, no cyanosis or rash Psyche: normal mood and affect    BMET    Component Value Date/Time   NA 140 06/16/2018 1149   K 4.5 06/16/2018 1149   CL 101 06/16/2018 1149   CO2 29 06/16/2018 1149   GLUCOSE 106 (H) 06/16/2018 1149   BUN 15 06/16/2018 1149   CREATININE 0.86 06/16/2018 1149   CREATININE 0.85  02/12/2015 0949   CALCIUM 9.0 06/16/2018 1149   GFRNONAA >60 06/16/2018 1149   GFRAA >60 06/16/2018 1149   Microbiology  BAL February 2018 Pseudomonas  PFT: PFT 2011:  FEV1 1.34 (55%), ratio 68, ++airtrapping on lung volumes, nl TLC, DLCO 40% pred. PFT's  03/23/2015  FEV1 1.61 (68 % ) ratio 69  p no % improvement from saba on   Symb/spiriva January 2018 pulmonary function testing ratio 70%, FEV1 1.63 L 70% predicted, FVC 2.33 L 76% predicted, total lung capacity 4.48 L 86% predicted, DLCO 5.04 21 percent predicted January 2019 PFT: Ratio 66%, FEV1 1.50 L, 65%, FEV1 2.28 L 75% predicted, total lung capacity 5.0 L 96% predicted, DLCO 5.21 mL 20% predicted  Echo: 12/2017 TTE: LVEF 55%, no LVH, mild RV dilation, no PA pressure estimate  Heart Cath: LHC 12/2011: non-obstructive cad RHC 12/2011:  PA 45/29 with mean 65m, PCWP 22, CO Fick 10.25, PVR 1.17 wood units, slight step up in saturation from   RV to PA 07/04/2015 RHC: RA mean 9 RV 45/10 PA 41/20, mean 30 PCWP mean 13  Oxygen saturations: SVC 68% RA 65% RV 61% PA 69% LA 98% Peripheral sat 93%  Cardiac Output (Fick) 6.7  Cardiac Index (Fick) 3.3 PVR 2.5 WU Qp/Qs = 0.97 .  Imaging: CT chest 12/2011:  No PE, very mild mosaic perfusion abnl (prob secondary to her known airflow obstruction).  January 2018 high-resolution CT scan of the chest shows air trapping, irregular distribution upper lobe fibrotic changes (interlobular septal thickening, some patchy ground glass) in a patchy distribution not consistent with usual interstitial pneumonitis  Cardiac imaging: Myoview 2012: no ischemia Echo 2012: nl LV and EF, impaired relaxation, nothing to suggest pulm htn. Echo 76/23/7628  Nl LV, diastolic dysfxn, normal RV, estimated RVSP 46 TEE 03/2012:  A few late bubbles seen felt c/w small IP shunting.  Cardiac MRI 2013:  No infiltrative process.   Twelve-lead EKG: September 08, 2017 reviewed showing QTc interval of 4.65 ms  Records from her visit with  cardiology reviewed for pulmonary hypertension.  An echocardiogram did not show evidence of RV failure     Assessment & Plan:    No diagnosis found.   Discussion: [facility] is having another exacerbation of her severe COPD and severe chronic respiratory failure with hypoxemia.  I think that we could reduce the frequency of these episodes if she would be compliant with her inhaled tobramycin.  We try to do some education today about the importance of using this regularly.  She says she will start  taking it again.  She does have a flareup of her COPD now so she needs to take Levaquin again for a week.  Plan: Severe COPD with Pseudomonas colonization, now with acute exacerbation: Take Levaquin 500 mg daily x7 days, hold azithromycin while taking this medicine After finishing the Levaquin resume azithromycin 250 mg daily Continue Symbicort 160/4.52 puffs twice a day Continue Spiriva 2.5 2 puffs daily Continue prednisone 5 mg daily Resume tobramycin nebulized 300 mg twice a day every other month, this medicine helps reduce the frequency of your bronchitis episodes  Worsening shortness of breath: Try to exercise as much as you can  Chronic respiratory failure with hypoxemia:  Continue using 4 to 8 L of oxygen continuously  Pulmonary hypertension:  Keep taking the diuretic medicines as arranged by the advanced heart failure clinic  CODE STATUS: DNR  Follow-up with me in 6 to 8 weeks or sooner if needed   Current Outpatient Medications:  .  acetaminophen (TYLENOL) 500 MG tablet, Take 1 tablet (500 mg total) by mouth every 6 (six) hours as needed for mild pain or headache., Disp: 30 tablet, Rfl: 0 .  albuterol (PROVENTIL) (2.5 MG/3ML) 0.083% nebulizer solution, USE 1 VIAL VIA NEBULIZER EVERY 6 HOURS AS NEEDED FOR WHEEZING OR SHORTNESS OF BREATH, Disp: 120 mL, Rfl: 5 .  ALPRAZolam (XANAX) 0.5 MG tablet, Take 0.25-0.5 mg by mouth daily as needed for  anxiety. , Disp: , Rfl: 4 .  atorvastatin (LIPITOR) 40 MG tablet, Take 40 mg by mouth at bedtime. , Disp: , Rfl:  .  azithromycin (ZITHROMAX) 250 MG tablet, TAKE 1 TABLET BY MOUTH DAILY. START AFTER FINISHING LEVAQUIN, Disp: 90 tablet, Rfl: 3 .  benzonatate (TESSALON) 200 MG capsule, Take 400 mg by mouth at bedtime. , Disp: , Rfl:  .  buPROPion (WELLBUTRIN XL) 300 MG 24 hr tablet, Take 300 mg by mouth daily., Disp: , Rfl:  .  DULoxetine (CYMBALTA) 60 MG capsule, Take 60 mg by mouth daily. , Disp: , Rfl:  .  gabapentin (NEURONTIN) 600 MG tablet, Take 600 mg by mouth 3 (three) times daily. , Disp: , Rfl:  .  Guaifenesin (MUCINEX MAXIMUM STRENGTH) 1200 MG TB12, Take 1,200 mg by mouth 2 (two) times daily as needed (for cough or congestion). , Disp: , Rfl:  .  HYDROcodone-acetaminophen (NORCO/VICODIN) 5-325 MG per tablet, Take 1 tablet by mouth See admin instructions. EVERY 4-6 HOURS AS NEEDED FOR PAIN OR SHORTNESS OF BREATH, Disp: , Rfl:  .  insulin aspart (NOVOLOG FLEXPEN) 100 UNIT/ML FlexPen, Inject 5 Units into the skin 3 (three) times daily as needed for high blood sugar., Disp: , Rfl:  .  insulin glargine (LANTUS) 100 UNIT/ML injection, Inject 0.66 mLs (66 Units total) into the skin daily before breakfast. (Patient taking differently: Inject 64 Units into the skin daily before breakfast. ), Disp: , Rfl:  .  iron polysaccharides (NIFEREX) 150 MG capsule, Take 1 capsule (150 mg total) by mouth 2 (two) times daily. (Patient taking differently: Take 150 mg by mouth daily. ), Disp: 60 capsule, Rfl: 0 .  levalbuterol (XOPENEX HFA) 45 MCG/ACT inhaler, Inhale 1-2 puffs into the lungs every 4 (four) hours as needed for wheezing. (Patient taking differently: Inhale 2 puffs into the lungs every 4 (four) hours as needed for wheezing or shortness of breath. ), Disp: 1 Inhaler, Rfl: 12 .  metolazone (ZAROXOLYN) 5 MG tablet, Take 5 mg by mouth daily as needed., Disp: , Rfl:  .  Multiple Vitamin (  MULTIVITAMIN WITH  MINERALS) TABS tablet, Take 1 tablet by mouth daily., Disp: , Rfl:  .  omeprazole (PRILOSEC OTC) 20 MG tablet, Take 20 mg by mouth at bedtime., Disp: , Rfl:  .  OXYGEN, Inhale 5-8 L into the lungs See admin instructions. 5 liters when at rest and 8 liters when ambulatory, Disp: , Rfl:  .  polyethylene glycol (MIRALAX / GLYCOLAX) packet, Take 17 g by mouth daily. Mix in 8 oz liquid and drink, Disp: , Rfl:  .  polyvinyl alcohol (ARTIFICIAL TEARS) 1.4 % ophthalmic solution, Place 1 drop into both eyes 2 (two) times daily. , Disp: , Rfl:  .  potassium chloride (K-DUR,KLOR-CON) 10 MEQ tablet, Take 3 tablets (30 mEq total) by mouth 2 (two) times daily., Disp: 180 tablet, Rfl: 5 .  predniSONE (DELTASONE) 5 MG tablet, TAKE 1 TABLET(5 MG) BY MOUTH DAILY WITH BREAKFAST, Disp: 30 tablet, Rfl: 3 .  roflumilast (DALIRESP) 500 MCG TABS tablet, Take 500 mcg by mouth daily., Disp: , Rfl:  .  sodium chloride HYPERTONIC 3 % nebulizer solution, USE 1 VIAL IN NEBULIZER TWO TIMES DAILY., Disp: 240 mL, Rfl: 3 .  SPIRIVA HANDIHALER 18 MCG inhalation capsule, INHALE THE CONTENTS OF 1 CAPSULE VIA HANDIHALER BY MOUTH EVERY MORNING, Disp: 30 capsule, Rfl: 5 .  SYMBICORT 160-4.5 MCG/ACT inhaler, INHALE 2 PUFFS BY MOUTH TWICE DAILY, Disp: 10.2 g, Rfl: 5 .  tobramycin, PF, (TOBI) 300 MG/5ML nebulizer solution, Take 5 mLs (300 mg total) by nebulization 2 (two) times daily. Every other month DX: J44.9, Disp: 300 mL, Rfl: 5 .  torsemide (DEMADEX) 20 MG tablet, Take 80 mg (4 tabs) in am and 40 mg (2 tabs) in pm, Disp: 180 tablet, Rfl: 11 .  verapamil (VERELAN PM) 360 MG 24 hr capsule, Take 1 capsule (360 mg total) by mouth daily., Disp: 30 capsule, Rfl: 3 .  XARELTO 20 MG TABS tablet, Take 20 mg by mouth daily with supper., Disp: , Rfl: 6

## 2018-07-28 NOTE — Patient Instructions (Signed)
Severe COPD with Pseudomonas colonization, now with acute exacerbation: Take Levaquin 500 mg daily x7 days, hold azithromycin while taking this medicine After finishing the Levaquin resume azithromycin 250 mg daily Continue Symbicort 160/4.52 puffs twice a day Continue Spiriva 2.5 2 puffs daily Continue prednisone 5 mg daily Resume tobramycin nebulized 300 mg twice a day every other month, this medicine helps reduce the frequency of your bronchitis episodes  Worsening shortness of breath: Try to exercise as much as you can  Chronic respiratory failure with hypoxemia:  Continue using 4 to 8 L of oxygen continuously  Pulmonary hypertension:  Keep taking the diuretic medicines as arranged by the advanced heart failure clinic  Follow-up with me in 6 to 8 weeks or sooner if needed

## 2018-07-28 NOTE — Telephone Encounter (Signed)
ATC Patient.  Left message to call back when available.  

## 2018-07-29 ENCOUNTER — Other Ambulatory Visit (HOSPITAL_COMMUNITY): Payer: Self-pay | Admitting: Cardiology

## 2018-07-29 NOTE — Telephone Encounter (Signed)
Called and spoke with pt letting her know that BQ said to use Symb inhaler instead of brovana and pulmicort and stated to her that we would make sure brovana and pulmicort are off of her med list.

## 2018-07-29 NOTE — Telephone Encounter (Signed)
Called and spoke with patient she states Dr. Lake Bells wanted her to let him know which medications she had at her house that she wasn't using. She states those are brovana and Budesonide. Will route to BQ at [facility]  Nothing further needed

## 2018-07-29 NOTE — Telephone Encounter (Signed)
If she has Symbicort at home then she can continue using that instead of brovana and pulmicort. Please take brovana and pulmicort off her medication list.

## 2018-07-30 ENCOUNTER — Other Ambulatory Visit (HOSPITAL_COMMUNITY): Payer: Self-pay | Admitting: Cardiology

## 2018-08-05 ENCOUNTER — Other Ambulatory Visit: Payer: Self-pay | Admitting: Pulmonary Disease

## 2018-08-07 ENCOUNTER — Other Ambulatory Visit: Payer: Self-pay | Admitting: Pulmonary Disease

## 2018-08-30 ENCOUNTER — Telehealth: Payer: Self-pay | Admitting: Pulmonary Disease

## 2018-08-30 NOTE — Telephone Encounter (Signed)
lmom to confirm what labs are needed ?

## 2018-08-30 NOTE — Telephone Encounter (Signed)
Labs have been faxed. Nothing further was needed. 

## 2018-08-30 NOTE — Telephone Encounter (Signed)
Destiny Shelton returning phone call. States she needs the [facility] January 2020.  Needs CMP or BMP.  Fax number [phone].  Phone number is [phone].

## 2018-08-31 DIAGNOSIS — M810 Age-related osteoporosis without current pathological fracture: Secondary | ICD-10-CM | POA: Diagnosis not present

## 2018-09-02 ENCOUNTER — Other Ambulatory Visit (HOSPITAL_COMMUNITY): Payer: Self-pay | Admitting: Cardiology

## 2018-09-08 DIAGNOSIS — M810 Age-related osteoporosis without current pathological fracture: Secondary | ICD-10-CM | POA: Diagnosis not present

## 2018-09-09 DIAGNOSIS — J441 Chronic obstructive pulmonary disease with (acute) exacerbation: Secondary | ICD-10-CM | POA: Diagnosis not present

## 2018-09-09 DIAGNOSIS — J449 Chronic obstructive pulmonary disease, unspecified: Secondary | ICD-10-CM | POA: Diagnosis not present

## 2018-09-09 DIAGNOSIS — I5032 Chronic diastolic (congestive) heart failure: Secondary | ICD-10-CM | POA: Diagnosis not present

## 2018-09-09 DIAGNOSIS — I110 Hypertensive heart disease with heart failure: Secondary | ICD-10-CM | POA: Diagnosis not present

## 2018-09-10 ENCOUNTER — Encounter (HOSPITAL_COMMUNITY): Payer: Medicare Other | Admitting: Cardiology

## 2018-09-15 DIAGNOSIS — E1149 Type 2 diabetes mellitus with other diabetic neurological complication: Secondary | ICD-10-CM | POA: Diagnosis not present

## 2018-09-15 DIAGNOSIS — M810 Age-related osteoporosis without current pathological fracture: Secondary | ICD-10-CM | POA: Diagnosis not present

## 2018-09-15 DIAGNOSIS — I10 Essential (primary) hypertension: Secondary | ICD-10-CM | POA: Diagnosis not present

## 2018-09-15 DIAGNOSIS — E7849 Other hyperlipidemia: Secondary | ICD-10-CM | POA: Diagnosis not present

## 2018-09-16 DIAGNOSIS — I10 Essential (primary) hypertension: Secondary | ICD-10-CM | POA: Diagnosis not present

## 2018-09-16 DIAGNOSIS — R82998 Other abnormal findings in urine: Secondary | ICD-10-CM | POA: Diagnosis not present

## 2018-09-21 ENCOUNTER — Ambulatory Visit: Payer: Medicare Other | Admitting: Pulmonary Disease

## 2018-09-22 DIAGNOSIS — Z7901 Long term (current) use of anticoagulants: Secondary | ICD-10-CM | POA: Diagnosis not present

## 2018-09-22 DIAGNOSIS — I130 Hypertensive heart and chronic kidney disease with heart failure and stage 1 through stage 4 chronic kidney disease, or unspecified chronic kidney disease: Secondary | ICD-10-CM | POA: Diagnosis not present

## 2018-09-22 DIAGNOSIS — I5032 Chronic diastolic (congestive) heart failure: Secondary | ICD-10-CM | POA: Diagnosis not present

## 2018-09-22 DIAGNOSIS — Z1331 Encounter for screening for depression: Secondary | ICD-10-CM | POA: Diagnosis not present

## 2018-09-22 DIAGNOSIS — I4891 Unspecified atrial fibrillation: Secondary | ICD-10-CM | POA: Diagnosis not present

## 2018-09-22 DIAGNOSIS — N183 Chronic kidney disease, stage 3 (moderate): Secondary | ICD-10-CM | POA: Diagnosis not present

## 2018-09-22 DIAGNOSIS — J449 Chronic obstructive pulmonary disease, unspecified: Secondary | ICD-10-CM | POA: Diagnosis not present

## 2018-09-22 DIAGNOSIS — E785 Hyperlipidemia, unspecified: Secondary | ICD-10-CM | POA: Diagnosis not present

## 2018-09-22 DIAGNOSIS — E1139 Type 2 diabetes mellitus with other diabetic ophthalmic complication: Secondary | ICD-10-CM | POA: Diagnosis not present

## 2018-09-22 DIAGNOSIS — J9611 Chronic respiratory failure with hypoxia: Secondary | ICD-10-CM | POA: Diagnosis not present

## 2018-09-22 DIAGNOSIS — Z0000 Encounter for general adult medical examination without abnormal findings: Secondary | ICD-10-CM | POA: Diagnosis not present

## 2018-09-22 DIAGNOSIS — I272 Pulmonary hypertension, unspecified: Secondary | ICD-10-CM | POA: Diagnosis not present

## 2018-09-23 ENCOUNTER — Ambulatory Visit: Payer: Medicare Other | Admitting: Pulmonary Disease

## 2018-09-27 ENCOUNTER — Ambulatory Visit (INDEPENDENT_AMBULATORY_CARE_PROVIDER_SITE_OTHER): Payer: Medicare Other | Admitting: Adult Health

## 2018-09-27 ENCOUNTER — Encounter: Payer: Self-pay | Admitting: Adult Health

## 2018-09-27 ENCOUNTER — Other Ambulatory Visit: Payer: Self-pay

## 2018-09-27 DIAGNOSIS — J9611 Chronic respiratory failure with hypoxia: Secondary | ICD-10-CM

## 2018-09-27 DIAGNOSIS — J449 Chronic obstructive pulmonary disease, unspecified: Secondary | ICD-10-CM | POA: Diagnosis not present

## 2018-09-27 DIAGNOSIS — I5032 Chronic diastolic (congestive) heart failure: Secondary | ICD-10-CM

## 2018-09-27 NOTE — Assessment & Plan Note (Signed)
Doing well on oxygen  Plan  Patient Instructions  Continue on Symbiocrt 2 puffs Twice daily , rinse after use Continue on Prednisone 5mg  daily .  Continue on daily Azithromycin .  Continue on Spiriva daily  Continue on schedule of  Tobramycin Neb Twice daily  (every other month) Continue on Oxygen 5 l/m rest and 8 l/m activity .  Follow up with Dr. Lake Bells or Zitlali Primm NP in 6-8 weeks and As needed

## 2018-09-27 NOTE — Patient Instructions (Signed)
Continue on Symbiocrt 2 puffs Twice daily , rinse after use Continue on Prednisone 5mg  daily .  Continue on daily Azithromycin .  Continue on Spiriva daily  Continue on schedule of  Tobramycin Neb Twice daily  (every other month) Continue on Oxygen 5 l/m rest and 8 l/m activity .  Follow up with Dr. Lake Bells or Salam Chesterfield NP in 6-8 weeks and As needed

## 2018-09-27 NOTE — Assessment & Plan Note (Addendum)
Recent exacerbation now improved Patient is on a very aggressive regimen.  She had recent lab work from PCP that was reported to her as normal.  She will need ongoing monitoring as she is on daily azithromycin.. Also is on tobramycin nebs every other month for Pseudomonas.  Appears to be tolerating well.  We will continue to monitor closely.  Plan  Patient Instructions  Continue on Symbiocrt 2 puffs Twice daily , rinse after use Continue on Prednisone 5mg  daily .  Continue on daily Azithromycin .  Continue on Spiriva daily  Continue on schedule of  Tobramycin Neb Twice daily  (every other month) Continue on Oxygen 5 l/m rest and 8 l/m activity .  Follow up with Dr. Lake Bells or Omran Keelin NP in 6-8 weeks and As needed

## 2018-09-27 NOTE — Progress Notes (Signed)
_0  ID: Destiny Shelton, female    DOB: 20-Sep-1945, 73 y.o.   MRN: [id number]  Chief Complaint  Patient presents with  . Follow-up    COPD    Referring provider: Marton Redwood, MD  HPI: 73 year old female former smoker followed for COPD, pulmonary hypertension and ILD, O2 RF And Chronic Bronchitis with chronic Pseudomonas Infection 73 year old female former smoker followed for COPD, pulmonary hypertension and ILD, O2 RF And Chronic Bronchitis with chronic Pseudomonas Infection    TEST/EVENTS :  06/2016 HRCT Chest >Fibrotic interstitial lung disease characterized by patchy peribronchovascular and subpleural reticulation, mild traction bronchiectasis, mild architectural distortion, mosaic attenuation with air trapping and scattered predominantly centrilobular nodularity. No frank honeycombing. No clear basilar gradient. Findings have progressed compared to 05-08-2012 chest CT, with minimal interval progression since 01/22/2016. Findings are favored represent chronic hypersensitivity pneumonitis. Findings are not consistent with usual interstitial pneumonia (UIP). 3. Mild centrilobular and paraseptal emphysema with mild diffuse bronchial wall thickening, suggesting COPD . pfts 2011: FEV1 1.34 (55%), ratio 68, ++airtrapping on lung volumes, nl TLC, DLCO 40% pred. PFT's 03/23/2015 FEV1 1.61 (68 % ) ratio 69 p no % improvement from saba on Symb/spiriva  Echo 2012: nl LV and EF, impaired relaxation, nothing to suggest pulm htn. Myoview 2012: no ischemia LHC 12/2011: non-obstructive cad RHC 12/2011: PA 45/29 with mean 67m, PCWP 22, CO Fick 10.25, PVR 1.17 wood units, slight step up in saturation from RV to [place]: Equal 4 chamber pressures, but no square root sign.  Echo 71/10/2692 Nl LV, diastolic dysfxn, normal RV, estimated RVSP 46.  TEE 03/2012: A few late bubbles seen felt c/w small IP shunting.  CT chest 12/2011: No PE, very mild  mosaic perfusion abnl (prob secondary to her known airflow obstruction).  Cardiac MRI 2013: No infiltrative process.  TCD bubble study 03/2012: + for medium intracardiac right to left shunt.  07/04/2015 RHC: RA mean 9 RV 45/10 PA 41/20, mean 30 PCWP mean 13 Oxygen saturations: SVC 68% RA 65% RV 61% PA 69% LA 98% Peripheral sat 93% Cardiac Output (Fick) 6.7 Cardiac Index (Fick) 3.3 PVR 2.5 WU Qp/Qs = 0.97 06/2016 HRCT>Upper lobe predominant fibrotic change and with centrilobular emphysema, no honeycombing January 2018 pulmonary function testing ratio 70%, FEV1 1.63 L 70% predicted, FVC 2.33 L 76% predicted, total lung capacity 4.48 L 86% predicted, DLCO 5. 09/06/2019 percent predicted BAL 07/2016 >+few psuedomonas-pan sens Daily azithromycin started 2018 Chronic steroids with prednisone 5 mg daily to January 2019 Echo: 12/2017 TTE: LVEF 55%, no LVH, mild RV dilation, no PA pressure estimate March 2019 started on tobramycin nebulizer twice daily every other month 03/2018 Trial of Brovana /Pulmicort neb , no benefit, changed back to Symbicort   09/27/2018 Follow up : COPD , ILD , O2 RF , Pulmonary HTN  Patient presents for a 275-monthollow-up.  Patient has underlying COPD and interstitial lung disease. She remains on Symbicort, Spiriva, prednisone 5 mg daily and daily azithromycin.  She uses albuterol nebulizer intermittently.. Patient says she did have a flare of her breathing 1 to 2 weeks ago was put on a short prednisone burst.  She has recently finished this.  She does feel like that this helped.  She denies any discolored mucus or fever. With her chronic lung disease she has had recurrent Pseudomonas.  And she is on tobramycin nebulizers twice daily every other month.  She says she has been tolerating this.  She remains on high oxygen demands with 5 L  at rest and 8 L with activity.  Patient says she has been doing pretty well on this.    Patient has diastolic heart failure mild pulmonary  hypertension.  She is on Demadex 80 in the morning and 40 mg in the evening.  Says that lower extremity swelling has been at baseline.  Had labs at PCP earlier this month reported as normal .   Allergies  Allergen Reactions  . Aspirin Shortness Of Breath  . Nsaids Shortness Of Breath    Immunization History  Administered Date(s) Administered  . Influenza Split 02/17/2011, 02/19/2012, 02/24/2015  . Influenza, High Dose Seasonal PF 03/10/2016, 03/26/2017, 03/04/2018  . Influenza,inj,Quad PF,6+ Mos 03/08/2013  . Influenza-Unspecified 01/31/2014  . Pneumococcal Conjugate-13 08/18/2008    Past Medical History:  Diagnosis Date  . Adenomatous colon polyp   . ALLERGIC RHINITIS   . Anemia    iron deficient  . Anxiety   . Atrial tachycardia (HCC)    Mostly Sinus Tachycardia  . Back pain, chronic   . Carotid stenosis   . CHF (congestive heart failure) ([place])   . Community acquired pneumonia - Recent Admission (07/2013) 07/08/2013  . COPD (chronic obstructive pulmonary disease) ([place])    Requiring Home O2 at 4L  . Depression   . Diabetes mellitus type 2 with neurological manifestations ([place])    On Insulin  . Diverticulosis of colon   . External hemorrhoids   . Fibromyalgia    "pain in arms and shoulders."  . Gastritis   . GERD (gastroesophageal reflux disease)   . Headache(784.0)    tension  . Hyperlipemia   . Hypertension   . Inappropriate sinus tachycardia   . Morbidly obese ([place])   . Nephrolithiasis   . Neuromuscular disorder ([place])    diabetic neuropathy  . Osteoarthritis   . Osteoporosis   . PAF (paroxysmal atrial fibrillation) ([place])   . 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  . Sleep apnea    no cpap machine  02 at 4l/min all the time  . Urosepsis 05/26/2012    Tobacco History: Social History   Tobacco Use  Smoking Status Former Smoker  . Packs/day: 2.00  . Years: 30.00  . Pack years: 60.00  . Types: Cigarettes  . Last attempt to quit: 06/02/1994  . Years since quitting:  24.3  Smokeless Tobacco Never Used   Counseling given: Not Answered   Outpatient Medications Prior to Visit  Medication Sig Dispense Refill  . acetaminophen (TYLENOL) 500 MG tablet Take 1 tablet (500 mg total) by mouth every 6 (six) hours as needed for mild pain or headache. 30 tablet 0  . albuterol (PROVENTIL) (2.5 MG/3ML) 0.083% nebulizer solution USE 1 VIAL VIA NEBULIZER EVERY 6 HOURS AS NEEDED FOR WHEEZING OR SHORTNESS OF BREATH 120 mL 5  . ALPRAZolam (XANAX) 0.5 MG tablet Take 0.25-0.5 mg by mouth daily as needed for anxiety.   4  . atorvastatin (LIPITOR) 40 MG tablet Take 40 mg by mouth at bedtime.     Marland Kitchen azithromycin (ZITHROMAX) 250 MG tablet TAKE 1 TABLET BY MOUTH DAILY. START AFTER FINISHING LEVAQUIN 90 tablet 3  . benzonatate (TESSALON) 200 MG capsule Take 400 mg by mouth at bedtime.     Marland Kitchen buPROPion (WELLBUTRIN XL) 300 MG 24 hr tablet Take 300 mg by mouth daily.    . DULoxetine (CYMBALTA) 60 MG capsule Take 60 mg by mouth daily.     Marland Kitchen gabapentin (NEURONTIN) 600 MG tablet Take 600 mg by mouth 3 (three) times daily.     Marland Kitchen  Guaifenesin (MUCINEX MAXIMUM STRENGTH) 1200 MG TB12 Take 1,200 mg by mouth 2 (two) times daily as needed (for cough or congestion).     Marland Kitchen HYDROcodone-acetaminophen (NORCO/VICODIN) 5-325 MG per tablet Take 1 tablet by mouth See admin instructions. EVERY 4-6 HOURS AS NEEDED FOR PAIN OR SHORTNESS OF BREATH    . insulin aspart (NOVOLOG FLEXPEN) 100 UNIT/ML FlexPen Inject 5 Units into the skin 3 (three) times daily as needed for high blood sugar.    . insulin glargine (LANTUS) 100 UNIT/ML injection Inject 0.66 mLs (66 Units total) into the skin daily before breakfast. (Patient taking differently: Inject 64 Units into the skin daily before breakfast. )    . iron polysaccharides (NIFEREX) 150 MG capsule Take 1 capsule (150 mg total) by mouth 2 (two) times daily. (Patient taking differently: Take 150 mg by mouth daily. ) 60 capsule 0  . levalbuterol (XOPENEX HFA) 45 MCG/ACT  inhaler Inhale 1-2 puffs into the lungs every 4 (four) hours as needed for wheezing. (Patient taking differently: Inhale 2 puffs into the lungs every 4 (four) hours as needed for wheezing or shortness of breath. ) 1 Inhaler 12  . metolazone (ZAROXOLYN) 5 MG tablet Take 5 mg by mouth daily as needed.    . Multiple Vitamin (MULTIVITAMIN WITH MINERALS) TABS tablet Take 1 tablet by mouth daily.    Marland Kitchen omeprazole (PRILOSEC OTC) 20 MG tablet Take 20 mg by mouth at bedtime.    . OXYGEN Inhale 5-8 L into the lungs See admin instructions. 5 liters when at rest and 8 liters when ambulatory    . polyethylene glycol (MIRALAX / GLYCOLAX) packet Take 17 g by mouth daily. Mix in 8 oz liquid and drink    . polyvinyl alcohol (ARTIFICIAL TEARS) 1.4 % ophthalmic solution Place 1 drop into both eyes 2 (two) times daily.     . potassium chloride (K-DUR) 10 MEQ tablet TAKE 3 TABLETS BY MOUTH TWICE DAILY 180 tablet 1  . predniSONE (DELTASONE) 5 MG tablet TAKE 1 TABLET(5 MG) BY MOUTH DAILY WITH BREAKFAST 30 tablet 3  . sodium chloride HYPERTONIC 3 % nebulizer solution USE 1 VIAL IN NEBULIZER TWO TIMES DAILY. 240 mL 3  . SPIRIVA HANDIHALER 18 MCG inhalation capsule INHALE THE CONTENTS OF 1 CAPSULE VIA HANDIHALER BY MOUTH EVERY MORNING 30 capsule 5  . SYMBICORT 160-4.5 MCG/ACT inhaler INHALE 2 PUFFS BY MOUTH TWICE DAILY 10.2 g 5  . tobramycin, PF, (TOBI) 300 MG/5ML nebulizer solution Take 5 mLs (300 mg total) by nebulization 2 (two) times daily. Every other month DX: J44.9 300 mL 5  . tobramycin, PF, (TOBI) 300 MG/5ML nebulizer solution Take twice daily every other month 280 mL 0  . torsemide (DEMADEX) 20 MG tablet Take 80 mg (4 tabs) in am and 40 mg (2 tabs) in pm 180 tablet 11  . verapamil (VERELAN PM) 360 MG 24 hr capsule TAKE 1 CAPSULE(360 MG) BY MOUTH DAILY 30 capsule 3  . XARELTO 20 MG TABS tablet Take 20 mg by mouth daily with supper.  6  . levofloxacin (LEVAQUIN) 500 MG tablet Take 1 tablet (500 mg total) by mouth  daily. (Patient not taking: Reported on 09/27/2018) 5 tablet 0   No facility-administered medications prior to visit.      Review of Systems:   Constitutional:   No  weight loss, night sweats,  Fevers, chills,  +fatigue, or  lassitude.  HEENT:   No headaches,  Difficulty swallowing,  Tooth/dental problems, or  Sore throat,  No sneezing, itching, ear ache,  +nasal congestion, post nasal drip,   CV:  No chest pain,  Orthopnea, PND, +swelling in lower extremities,  No anasarca, dizziness, palpitations, syncope.   GI  No heartburn, indigestion, abdominal pain, nausea, vomiting, diarrhea, change in bowel habits, loss of appetite, bloody stools.   Resp:   No chest wall deformity  Skin: no rash or lesions.  GU: no dysuria, change in color of urine, no urgency or frequency.  No flank pain, no hematuria   MS:  No joint pain or swelling.  No decreased range of motion.  No back pain.    Physical Exam  BP 106/62 (BP Location: Left Arm, Cuff Size: Normal)   Pulse (!) 105   Ht _0  (1.651 m)   Wt 199 lb (90.3 kg)   SpO2 94%   BMI 33.12 kg/m   GEN: A/Ox3; pleasant , NAD, chronically ill-appearing, on oxygen   HEENT:  [place]/AT,  EACs-clear, TMs-wnl, NOSE-clear, THROAT-clear, no lesions, no postnasal drip or exudate noted.   NECK:  Supple w/ fair ROM; no JVD; normal carotid impulses w/o bruits; no thyromegaly or nodules palpated; no lymphadenopathy.    RESP  Few trace rhonchi, no wheezing   no accessory muscle use, no dullness to percussion  CARD:  RRR, no m/r/g, tr  peripheral edema, pulses intact, no cyanosis or clubbing.  GI:   Soft & nt; nml bowel sounds; no organomegaly or masses detected.   Musco: Warm bil, no deformities or joint swelling noted.   Neuro: alert, no focal deficits noted.    Skin: Warm, thin skin , Stasis dermatatic changes, few excoriations, no sign redness noted along forearms and LE .     Lab Results:  CBC    Component Value Date/Time    WBC 13.4 (H) 06/16/2018 1149   RBC 4.30 06/16/2018 1149   HGB 10.4 (L) 06/16/2018 1149   HCT 37.8 06/16/2018 1149   PLT 327 06/16/2018 1149   MCV 87.9 06/16/2018 1149   MCH 24.2 (L) 06/16/2018 1149   MCHC 27.5 (L) 06/16/2018 1149   RDW 17.3 (H) 06/16/2018 1149   LYMPHSABS 1.3 03/02/2017 1200   MONOABS 1.0 03/02/2017 1200   EOSABS 0.0 03/02/2017 1200   BASOSABS 0.0 03/02/2017 1200    BMET    Component Value Date/Time   NA 140 06/16/2018 1149   K 4.5 06/16/2018 1149   CL 101 06/16/2018 1149   CO2 29 06/16/2018 1149   GLUCOSE 106 (H) 06/16/2018 1149   BUN 15 06/16/2018 1149   CREATININE 0.86 06/16/2018 1149   CREATININE 0.85 02/12/2015 0949   CALCIUM 9.0 06/16/2018 1149   GFRNONAA >60 06/16/2018 1149   GFRAA >60 06/16/2018 1149    BNP    Component Value Date/Time   BNP 21.2 01/29/2017 0544    ProBNP    Component Value Date/Time   PROBNP 42 11/25/2016 1518   PROBNP 316.0 (H) 07/08/2013 1425    Imaging: No results found.    PFT Results Latest Ref Rng & Units 06/15/2017 06/26/2016 03/23/2015  FVC-Pre L 2.29 2.30 2.42  FVC-Predicted Pre % 75 75 77  FVC-Post L 2.28 2.33 2.34  FVC-Predicted Post % 75 76 75  Pre FEV1/FVC % % 57 65 67  Post FEV1/FCV % % 66 70 69  FEV1-Pre L 1.30 1.50 1.61  FEV1-Predicted Pre % 57 65 68  FEV1-Post L 1.50 1.63 1.61  DLCO UNC% % 20 21 -  DLCO COR %Predicted % 26 29 -  TLC L 5.00 4.48 4.67  TLC % Predicted % 96 86 89  RV % Predicted % 126 100 111    No results found for: NITRICOXIDE      Assessment & Plan:   COPD (chronic obstructive pulmonary disease) (HCC) Recent exacerbation now improved Patient is on a very aggressive regimen.  She had recent lab work from PCP that was reported to her as normal.  She will need ongoing monitoring as she is on daily azithromycin.. Also is on tobramycin nebs every other month for Pseudomonas.  Appears to be tolerating well.  We will continue to monitor closely.  Plan  Patient  Instructions  Continue on Symbiocrt 2 puffs Twice daily , rinse after use Continue on Prednisone 37m daily .  Continue on daily Azithromycin .  Continue on Spiriva daily  Continue on schedule of  Tobramycin Neb Twice daily  (every other month) Continue on Oxygen 5 l/m rest and 8 l/m activity .  Follow up with Dr. MLake Bellsor  NP in 6-8 weeks and As needed        Chronic diastolic CHF (congestive heart failure) ([place] Appears stable on current regimen Advised on low-salt diet  Plan  Patient Instructions  Continue on Symbiocrt 2 puffs Twice daily , rinse after use Continue on Prednisone 531mdaily .  Continue on daily Azithromycin .  Continue on Spiriva daily  Continue on schedule of  Tobramycin Neb Twice daily  (every other month) Continue on Oxygen 5 l/m rest and 8 l/m activity .  Follow up with Dr. McLake Bellsr  NP in 6-8 weeks and As needed        Chronic respiratory failure ([facility]  Plan  Patient Instructions  Continue on Symbiocrt 2 puffs Twice daily , rinse after use Continue on Prednisone 26m60maily .  Continue on daily Azithromycin .  Continue on Spiriva daily  Continue on schedule of  Tobramycin Neb Twice daily  (every other month) Continue on Oxygen 5 l/m rest and 8 l/m activity .  Follow up with Dr. McQLake Bells  NP in 6-8 weeks and As needed           TamRexene EdisonP 09/27/2018

## 2018-09-27 NOTE — Assessment & Plan Note (Signed)
Appears stable on current regimen Advised on low-salt diet  Plan  Patient Instructions  Continue on Symbiocrt 2 puffs Twice daily , rinse after use Continue on Prednisone 5mg  daily .  Continue on daily Azithromycin .  Continue on Spiriva daily  Continue on schedule of  Tobramycin Neb Twice daily  (every other month) Continue on Oxygen 5 l/m rest and 8 l/m activity .  Follow up with Dr. Lake Bells or [place] NP in 6-8 weeks and As needed

## 2018-09-30 NOTE — Progress Notes (Signed)
Reviewed, agree 

## 2018-10-02 ENCOUNTER — Other Ambulatory Visit (HOSPITAL_COMMUNITY): Payer: Self-pay | Admitting: Cardiology

## 2018-10-29 DIAGNOSIS — I4891 Unspecified atrial fibrillation: Secondary | ICD-10-CM | POA: Diagnosis not present

## 2018-10-29 DIAGNOSIS — J441 Chronic obstructive pulmonary disease with (acute) exacerbation: Secondary | ICD-10-CM | POA: Diagnosis not present

## 2018-10-29 DIAGNOSIS — I5032 Chronic diastolic (congestive) heart failure: Secondary | ICD-10-CM | POA: Diagnosis not present

## 2018-11-08 ENCOUNTER — Emergency Department (HOSPITAL_COMMUNITY): Payer: Medicare Other

## 2018-11-08 ENCOUNTER — Other Ambulatory Visit: Payer: Self-pay

## 2018-11-08 ENCOUNTER — Ambulatory Visit: Payer: Medicare Other | Admitting: Adult Health

## 2018-11-08 ENCOUNTER — Inpatient Hospital Stay (HOSPITAL_COMMUNITY)
Admission: EM | Admit: 2018-11-08 | Discharge: 2018-11-12 | DRG: 291 | Disposition: A | Discharge status: Home Health | Payer: Medicare Other | Attending: Family Medicine | Admitting: Family Medicine

## 2018-11-08 ENCOUNTER — Encounter (HOSPITAL_COMMUNITY): Payer: Self-pay | Admitting: Pharmacy Technician

## 2018-11-08 ENCOUNTER — Encounter: Payer: Self-pay | Admitting: Adult Health

## 2018-11-08 ENCOUNTER — Ambulatory Visit (INDEPENDENT_AMBULATORY_CARE_PROVIDER_SITE_OTHER): Payer: Medicare Other | Admitting: Adult Health

## 2018-11-08 DIAGNOSIS — J440 Chronic obstructive pulmonary disease with acute lower respiratory infection: Secondary | ICD-10-CM | POA: Diagnosis present

## 2018-11-08 DIAGNOSIS — I480 Paroxysmal atrial fibrillation: Secondary | ICD-10-CM | POA: Diagnosis present

## 2018-11-08 DIAGNOSIS — I10 Essential (primary) hypertension: Secondary | ICD-10-CM | POA: Diagnosis not present

## 2018-11-08 DIAGNOSIS — Z515 Encounter for palliative care: Secondary | ICD-10-CM

## 2018-11-08 DIAGNOSIS — Z7952 Long term (current) use of systemic steroids: Secondary | ICD-10-CM

## 2018-11-08 DIAGNOSIS — Z808 Family history of malignant neoplasm of other organs or systems: Secondary | ICD-10-CM

## 2018-11-08 DIAGNOSIS — R0602 Shortness of breath: Secondary | ICD-10-CM

## 2018-11-08 DIAGNOSIS — Z8249 Family history of ischemic heart disease and other diseases of the circulatory system: Secondary | ICD-10-CM

## 2018-11-08 DIAGNOSIS — I272 Pulmonary hypertension, unspecified: Secondary | ICD-10-CM | POA: Diagnosis present

## 2018-11-08 DIAGNOSIS — I959 Hypotension, unspecified: Secondary | ICD-10-CM | POA: Diagnosis not present

## 2018-11-08 DIAGNOSIS — J9622 Acute and chronic respiratory failure with hypercapnia: Secondary | ICD-10-CM

## 2018-11-08 DIAGNOSIS — K219 Gastro-esophageal reflux disease without esophagitis: Secondary | ICD-10-CM | POA: Diagnosis present

## 2018-11-08 DIAGNOSIS — E1149 Type 2 diabetes mellitus with other diabetic neurological complication: Secondary | ICD-10-CM | POA: Diagnosis present

## 2018-11-08 DIAGNOSIS — G8929 Other chronic pain: Secondary | ICD-10-CM | POA: Diagnosis present

## 2018-11-08 DIAGNOSIS — Z7189 Other specified counseling: Secondary | ICD-10-CM

## 2018-11-08 DIAGNOSIS — Z66 Do not resuscitate: Secondary | ICD-10-CM | POA: Diagnosis present

## 2018-11-08 DIAGNOSIS — J189 Pneumonia, unspecified organism: Secondary | ICD-10-CM | POA: Diagnosis present

## 2018-11-08 DIAGNOSIS — I5032 Chronic diastolic (congestive) heart failure: Secondary | ICD-10-CM

## 2018-11-08 DIAGNOSIS — N179 Acute kidney failure, unspecified: Secondary | ICD-10-CM | POA: Diagnosis present

## 2018-11-08 DIAGNOSIS — Z87891 Personal history of nicotine dependence: Secondary | ICD-10-CM | POA: Diagnosis not present

## 2018-11-08 DIAGNOSIS — J9621 Acute and chronic respiratory failure with hypoxia: Secondary | ICD-10-CM | POA: Diagnosis present

## 2018-11-08 DIAGNOSIS — J9601 Acute respiratory failure with hypoxia: Secondary | ICD-10-CM

## 2018-11-08 DIAGNOSIS — J9600 Acute respiratory failure, unspecified whether with hypoxia or hypercapnia: Secondary | ICD-10-CM | POA: Diagnosis not present

## 2018-11-08 DIAGNOSIS — Z79899 Other long term (current) drug therapy: Secondary | ICD-10-CM | POA: Diagnosis not present

## 2018-11-08 DIAGNOSIS — I110 Hypertensive heart disease with heart failure: Secondary | ICD-10-CM | POA: Diagnosis present

## 2018-11-08 DIAGNOSIS — J80 Acute respiratory distress syndrome: Secondary | ICD-10-CM | POA: Diagnosis not present

## 2018-11-08 DIAGNOSIS — R630 Anorexia: Secondary | ICD-10-CM | POA: Diagnosis present

## 2018-11-08 DIAGNOSIS — R000 Tachycardia, unspecified: Secondary | ICD-10-CM | POA: Diagnosis not present

## 2018-11-08 DIAGNOSIS — Z6835 Body mass index (BMI) 35.0-35.9, adult: Secondary | ICD-10-CM | POA: Diagnosis not present

## 2018-11-08 DIAGNOSIS — Z79891 Long term (current) use of opiate analgesic: Secondary | ICD-10-CM

## 2018-11-08 DIAGNOSIS — Z20828 Contact with and (suspected) exposure to other viral communicable diseases: Secondary | ICD-10-CM | POA: Diagnosis not present

## 2018-11-08 DIAGNOSIS — R0689 Other abnormalities of breathing: Secondary | ICD-10-CM | POA: Diagnosis not present

## 2018-11-08 DIAGNOSIS — I5033 Acute on chronic diastolic (congestive) heart failure: Secondary | ICD-10-CM | POA: Diagnosis present

## 2018-11-08 DIAGNOSIS — Z7901 Long term (current) use of anticoagulants: Secondary | ICD-10-CM

## 2018-11-08 DIAGNOSIS — E119 Type 2 diabetes mellitus without complications: Secondary | ICD-10-CM

## 2018-11-08 DIAGNOSIS — Z833 Family history of diabetes mellitus: Secondary | ICD-10-CM

## 2018-11-08 DIAGNOSIS — M810 Age-related osteoporosis without current pathological fracture: Secondary | ICD-10-CM | POA: Diagnosis present

## 2018-11-08 DIAGNOSIS — R531 Weakness: Secondary | ICD-10-CM | POA: Diagnosis not present

## 2018-11-08 DIAGNOSIS — T502X5A Adverse effect of carbonic-anhydrase inhibitors, benzothiadiazides and other diuretics, initial encounter: Secondary | ICD-10-CM | POA: Diagnosis present

## 2018-11-08 DIAGNOSIS — Z96651 Presence of right artificial knee joint: Secondary | ICD-10-CM | POA: Diagnosis present

## 2018-11-08 DIAGNOSIS — J849 Interstitial pulmonary disease, unspecified: Secondary | ICD-10-CM | POA: Diagnosis present

## 2018-11-08 DIAGNOSIS — M797 Fibromyalgia: Secondary | ICD-10-CM | POA: Diagnosis present

## 2018-11-08 DIAGNOSIS — E785 Hyperlipidemia, unspecified: Secondary | ICD-10-CM | POA: Diagnosis present

## 2018-11-08 DIAGNOSIS — Z823 Family history of stroke: Secondary | ICD-10-CM

## 2018-11-08 DIAGNOSIS — Z7951 Long term (current) use of inhaled steroids: Secondary | ICD-10-CM

## 2018-11-08 DIAGNOSIS — R918 Other nonspecific abnormal finding of lung field: Secondary | ICD-10-CM | POA: Diagnosis not present

## 2018-11-08 DIAGNOSIS — Z86010 Personal history of colonic polyps: Secondary | ICD-10-CM

## 2018-11-08 DIAGNOSIS — Z794 Long term (current) use of insulin: Secondary | ICD-10-CM

## 2018-11-08 DIAGNOSIS — Z9981 Dependence on supplemental oxygen: Secondary | ICD-10-CM

## 2018-11-08 DIAGNOSIS — Z8371 Family history of colonic polyps: Secondary | ICD-10-CM

## 2018-11-08 HISTORY — DX: Type 2 diabetes mellitus without complications: E11.9

## 2018-11-08 LAB — CBC WITH DIFFERENTIAL/PLATELET
Abs Immature Granulocytes: 0.04 10*3/uL (ref 0.00–0.07)
Basophils Absolute: 0 10*3/uL (ref 0.0–0.1)
Basophils Relative: 0 %
Eosinophils Absolute: 0.1 10*3/uL (ref 0.0–0.5)
Eosinophils Relative: 1 %
HCT: 35.3 % — ABNORMAL LOW (ref 36.0–46.0)
Hemoglobin: 9.6 g/dL — ABNORMAL LOW (ref 12.0–15.0)
Immature Granulocytes: 0 %
Lymphocytes Relative: 16 %
Lymphs Abs: 1.5 10*3/uL (ref 0.7–4.0)
MCH: 22.2 pg — ABNORMAL LOW (ref 26.0–34.0)
MCHC: 27.2 g/dL — ABNORMAL LOW (ref 30.0–36.0)
MCV: 81.7 fL (ref 80.0–100.0)
Monocytes Absolute: 0.6 10*3/uL (ref 0.1–1.0)
Monocytes Relative: 7 %
Neutro Abs: 6.8 10*3/uL (ref 1.7–7.7)
Neutrophils Relative %: 76 %
Platelets: 272 10*3/uL (ref 150–400)
RBC: 4.32 MIL/uL (ref 3.87–5.11)
RDW: 18.1 % — ABNORMAL HIGH (ref 11.5–15.5)
WBC: 9 10*3/uL (ref 4.0–10.5)
nRBC: 0 % (ref 0.0–0.2)

## 2018-11-08 LAB — BLOOD GAS, ARTERIAL
Acid-Base Excess: 9.4 mmol/L — ABNORMAL HIGH (ref 0.0–2.0)
Bicarbonate: 34.6 mmol/L — ABNORMAL HIGH (ref 20.0–28.0)
Drawn by: 44166
FIO2: 1
O2 Content: 15 L/min
O2 Saturation: 99.3 %
Patient temperature: 98.6
pCO2 arterial: 59.2 mmHg — ABNORMAL HIGH (ref 32.0–48.0)
pH, Arterial: 7.385 (ref 7.350–7.450)
pO2, Arterial: 168 mmHg — ABNORMAL HIGH (ref 83.0–108.0)

## 2018-11-08 LAB — COMPREHENSIVE METABOLIC PANEL
ALT: 17 U/L (ref 0–44)
AST: 21 U/L (ref 15–41)
Albumin: 3.2 g/dL — ABNORMAL LOW (ref 3.5–5.0)
Alkaline Phosphatase: 51 U/L (ref 38–126)
Anion gap: 13 (ref 5–15)
BUN: 11 mg/dL (ref 8–23)
CO2: 29 mmol/L (ref 22–32)
Calcium: 8.7 mg/dL — ABNORMAL LOW (ref 8.9–10.3)
Chloride: 100 mmol/L (ref 98–111)
Creatinine, Ser: 0.98 mg/dL (ref 0.44–1.00)
GFR calc Af Amer: >60 mL/min (ref >60)
GFR calc non Af Amer: 57 mL/min — ABNORMAL LOW (ref >60)
Glucose, Bld: 75 mg/dL (ref 70–99)
Potassium: 3.7 mmol/L (ref 3.5–5.1)
Sodium: 142 mmol/L (ref 135–145)
Total Bilirubin: 0.5 mg/dL (ref 0.3–1.2)
Total Protein: 6 g/dL — ABNORMAL LOW (ref 6.5–8.1)

## 2018-11-08 LAB — PROTIME-INR
INR: 1.1 (ref 0.8–1.2)
Prothrombin Time: 14.4 seconds (ref 11.4–15.2)

## 2018-11-08 LAB — BRAIN NATRIURETIC PEPTIDE: B Natriuretic Peptide: 397.3 pg/mL — ABNORMAL HIGH (ref 0.0–100.0)

## 2018-11-08 LAB — TROPONIN I: Troponin I: 0.03 ng/mL (ref <0.03)

## 2018-11-08 LAB — SARS CORONAVIRUS 2 BY RT PCR (HOSPITAL ORDER, PERFORMED IN ~~LOC~~ HOSPITAL LAB): SARS Coronavirus 2: NEGATIVE

## 2018-11-08 MED ORDER — HYDROCODONE-ACETAMINOPHEN 5-325 MG PO TABS
1.0000 | ORAL_TABLET | ORAL | Status: DC | PRN
  Administered 2018-11-09 – 2018-11-11 (×4): 1 via ORAL
  Filled 2018-11-08 (×4): qty 1

## 2018-11-08 MED ORDER — POLYETHYLENE GLYCOL 3350 17 G PO PACK
17.0000 g | PACK | Freq: Every day | ORAL | Status: DC
  Administered 2018-11-10 – 2018-11-12 (×3): 17 g via ORAL
  Filled 2018-11-08 (×4): qty 1

## 2018-11-08 MED ORDER — MOMETASONE FURO-FORMOTEROL FUM 200-5 MCG/ACT IN AERO
2.0000 | INHALATION_SPRAY | Freq: Two times a day (BID) | RESPIRATORY_TRACT | Status: DC
  Administered 2018-11-09 – 2018-11-12 (×7): 2 via RESPIRATORY_TRACT
  Filled 2018-11-08: qty 8.8

## 2018-11-08 MED ORDER — POTASSIUM CHLORIDE CRYS ER 10 MEQ PO TBCR
30.0000 meq | EXTENDED_RELEASE_TABLET | Freq: Two times a day (BID) | ORAL | Status: DC
  Administered 2018-11-08 – 2018-11-11 (×7): 30 meq via ORAL
  Filled 2018-11-08 (×13): qty 3

## 2018-11-08 MED ORDER — VANCOMYCIN HCL 10 G IV SOLR
1500.0000 mg | Freq: Once | INTRAVENOUS | Status: AC
  Administered 2018-11-08: 1500 mg via INTRAVENOUS
  Filled 2018-11-08: qty 1500

## 2018-11-08 MED ORDER — PREDNISONE 20 MG PO TABS
60.0000 mg | ORAL_TABLET | Freq: Once | ORAL | Status: AC
  Administered 2018-11-08: 60 mg via ORAL
  Filled 2018-11-08: qty 3

## 2018-11-08 MED ORDER — POLYVINYL ALCOHOL 1.4 % OP SOLN
1.0000 [drp] | Freq: Two times a day (BID) | OPHTHALMIC | Status: DC
  Administered 2018-11-09 – 2018-11-12 (×8): 1 [drp] via OPHTHALMIC
  Filled 2018-11-08: qty 15

## 2018-11-08 MED ORDER — RIVAROXABAN 20 MG PO TABS
20.0000 mg | ORAL_TABLET | Freq: Every day | ORAL | Status: DC
  Administered 2018-11-09 – 2018-11-10 (×3): 20 mg via ORAL
  Filled 2018-11-08 (×3): qty 1

## 2018-11-08 MED ORDER — METOLAZONE 5 MG PO TABS
5.0000 mg | ORAL_TABLET | Freq: Every day | ORAL | Status: DC
  Administered 2018-11-08 – 2018-11-11 (×4): 5 mg via ORAL
  Filled 2018-11-08 (×4): qty 1

## 2018-11-08 MED ORDER — GUAIFENESIN ER 600 MG PO TB12: 1200.0000 mg | ORAL_TABLET | Freq: Two times a day (BID) | ORAL | Status: DC | PRN

## 2018-11-08 MED ORDER — SODIUM CHLORIDE 0.9 % IV SOLN
1.0000 g | Freq: Once | INTRAVENOUS | Status: AC
  Administered 2018-11-08: 19:00:00 1 g via INTRAVENOUS
  Filled 2018-11-08: qty 1

## 2018-11-08 MED ORDER — INSULIN ASPART 100 UNIT/ML FLEXPEN
5.0000 [IU] | PEN_INJECTOR | Freq: Three times a day (TID) | SUBCUTANEOUS | Status: DC | PRN
  Filled 2018-11-08: qty 3

## 2018-11-08 MED ORDER — BUPROPION HCL ER (XL) 150 MG PO TB24
300.0000 mg | ORAL_TABLET | Freq: Every day | ORAL | Status: DC
  Administered 2018-11-08 – 2018-11-12 (×5): 300 mg via ORAL
  Filled 2018-11-08 (×5): qty 2

## 2018-11-08 MED ORDER — METHYLPREDNISOLONE SODIUM SUCC 40 MG IJ SOLR
40.0000 mg | Freq: Four times a day (QID) | INTRAMUSCULAR | Status: DC
  Administered 2018-11-08 – 2018-11-10 (×7): 40 mg via INTRAVENOUS
  Filled 2018-11-08 (×7): qty 1

## 2018-11-08 MED ORDER — ADULT MULTIVITAMIN W/MINERALS CH
1.0000 | ORAL_TABLET | Freq: Every day | ORAL | Status: DC
  Administered 2018-11-09 – 2018-11-12 (×5): 1 via ORAL
  Filled 2018-11-08 (×5): qty 1

## 2018-11-08 MED ORDER — ACETAMINOPHEN 500 MG PO TABS: 500.0000 mg | ORAL_TABLET | Freq: Four times a day (QID) | ORAL | Status: DC | PRN

## 2018-11-08 MED ORDER — ONDANSETRON HCL 4 MG/2ML IJ SOLN: 4.0000 mg | Freq: Four times a day (QID) | INTRAMUSCULAR | Status: DC | PRN

## 2018-11-08 MED ORDER — BENZONATATE 100 MG PO CAPS
400.0000 mg | ORAL_CAPSULE | Freq: Every day | ORAL | Status: DC
  Administered 2018-11-08 – 2018-11-11 (×4): 400 mg via ORAL
  Filled 2018-11-08 (×5): qty 4

## 2018-11-08 MED ORDER — POLYSACCHARIDE IRON COMPLEX 150 MG PO CAPS
150.0000 mg | ORAL_CAPSULE | Freq: Every day | ORAL | Status: DC
  Administered 2018-11-09 – 2018-11-12 (×5): 150 mg via ORAL
  Filled 2018-11-08 (×5): qty 1

## 2018-11-08 MED ORDER — ATORVASTATIN CALCIUM 40 MG PO TABS
40.0000 mg | ORAL_TABLET | Freq: Every day | ORAL | Status: DC
  Administered 2018-11-09 – 2018-11-11 (×4): 40 mg via ORAL
  Filled 2018-11-08 (×5): qty 1

## 2018-11-08 MED ORDER — FUROSEMIDE 10 MG/ML IJ SOLN
INTRAMUSCULAR | Status: AC
  Administered 2018-11-08: 40 mg
  Filled 2018-11-08: qty 4

## 2018-11-08 MED ORDER — PANTOPRAZOLE SODIUM 40 MG PO TBEC
40.0000 mg | DELAYED_RELEASE_TABLET | Freq: Every day | ORAL | Status: DC
  Administered 2018-11-09 – 2018-11-11 (×4): 40 mg via ORAL
  Filled 2018-11-08 (×4): qty 1

## 2018-11-08 MED ORDER — ALBUTEROL SULFATE HFA 108 (90 BASE) MCG/ACT IN AERS
8.0000 | INHALATION_SPRAY | Freq: Once | RESPIRATORY_TRACT | Status: AC
  Administered 2018-11-08: 8 via RESPIRATORY_TRACT
  Filled 2018-11-08: qty 6.7

## 2018-11-08 MED ORDER — OMEPRAZOLE MAGNESIUM 20 MG PO TBEC: 20.0000 mg | DELAYED_RELEASE_TABLET | Freq: Every day | ORAL | Status: DC

## 2018-11-08 MED ORDER — ONDANSETRON HCL 4 MG PO TABS: 4.0000 mg | ORAL_TABLET | Freq: Four times a day (QID) | ORAL | Status: DC | PRN

## 2018-11-08 MED ORDER — INSULIN GLARGINE 100 UNIT/ML ~~LOC~~ SOLN
64.0000 [IU] | Freq: Every day | SUBCUTANEOUS | Status: DC
  Administered 2018-11-09 – 2018-11-12 (×4): 64 [IU] via SUBCUTANEOUS
  Filled 2018-11-08 (×5): qty 0.64

## 2018-11-08 MED ORDER — DULOXETINE HCL 60 MG PO CPEP
60.0000 mg | ORAL_CAPSULE | Freq: Every day | ORAL | Status: DC
  Administered 2018-11-08 – 2018-11-12 (×5): 60 mg via ORAL
  Filled 2018-11-08 (×5): qty 1

## 2018-11-08 MED ORDER — GABAPENTIN 600 MG PO TABS
600.0000 mg | ORAL_TABLET | Freq: Three times a day (TID) | ORAL | Status: DC
  Administered 2018-11-08 – 2018-11-12 (×12): 600 mg via ORAL
  Filled 2018-11-08 (×12): qty 1

## 2018-11-08 MED ORDER — FUROSEMIDE 10 MG/ML IJ SOLN
20.0000 mg | Freq: Once | INTRAMUSCULAR | Status: AC
  Administered 2018-11-08: 20 mg via INTRAVENOUS
  Filled 2018-11-08: qty 2

## 2018-11-08 MED ORDER — LEVALBUTEROL HCL 0.63 MG/3ML IN NEBU: 0.6300 mg | INHALATION_SOLUTION | RESPIRATORY_TRACT | Status: DC | PRN

## 2018-11-08 MED ORDER — VERAPAMIL HCL ER 240 MG PO TBCR
360.0000 mg | EXTENDED_RELEASE_TABLET | Freq: Every day | ORAL | Status: DC
  Filled 2018-11-08: qty 1.5

## 2018-11-08 MED ORDER — ENOXAPARIN SODIUM 40 MG/0.4ML ~~LOC~~ SOLN: 40.0000 mg | SUBCUTANEOUS | Status: DC

## 2018-11-08 MED ORDER — IPRATROPIUM-ALBUTEROL 0.5-2.5 (3) MG/3ML IN SOLN
3.0000 mL | Freq: Four times a day (QID) | RESPIRATORY_TRACT | Status: DC
  Administered 2018-11-09 – 2018-11-12 (×14): 3 mL via RESPIRATORY_TRACT
  Filled 2018-11-08 (×15): qty 3

## 2018-11-08 MED ORDER — SODIUM CHLORIDE 0.9 % IV SOLN: 2.0000 g | Freq: Three times a day (TID) | INTRAVENOUS | Status: DC

## 2018-11-08 MED ORDER — FUROSEMIDE 10 MG/ML IJ SOLN
40.0000 mg | Freq: Two times a day (BID) | INTRAMUSCULAR | Status: DC
  Administered 2018-11-09 – 2018-11-10 (×4): 40 mg via INTRAVENOUS
  Filled 2018-11-08 (×4): qty 4

## 2018-11-08 MED ORDER — ALPRAZOLAM 0.25 MG PO TABS: 0.2500 mg | ORAL_TABLET | Freq: Every day | ORAL | Status: DC | PRN

## 2018-11-08 MED ORDER — SODIUM CHLORIDE 0.9 % IV SOLN
2.0000 g | Freq: Three times a day (TID) | INTRAVENOUS | Status: DC
  Administered 2018-11-09 – 2018-11-10 (×5): 2 g via INTRAVENOUS
  Filled 2018-11-08 (×7): qty 2

## 2018-11-08 NOTE — Assessment & Plan Note (Signed)
Suspected decompensated CHF , +/- COPD exacerbation  Will need further evaluation as very weak , tachycardic in office and appears fluid overload  EMS with transport to ER . Report given   Plan  ER via EMS for further evaluation and treatment options

## 2018-11-08 NOTE — ED Triage Notes (Signed)
Pt arrives ems from pulmonologist. Increased weakness, rhonchi in all fields. Fluid weight gain over the last few days, weeping edema ble,  Pt normally on 5L [place], pt 94% on 6L.  18G LFA.

## 2018-11-08 NOTE — Progress Notes (Signed)
_0  ID: Destiny Shelton, female    DOB: Mar 13, 1946, 73 y.o.   MRN: [id number]  Chief Complaint  Patient presents with  . Follow-up    COPD     Referring provider: Marton Redwood, MD  HPI: 73 year old female former smoker followed for COPD, pulmonary hypertension and ILD, O2 RF And Chronic Bronchitis with chronic Pseudomonas Infection 73 year old female former smoker followed for COPD, pulmonary hypertension and ILD, O2 RF And Chronic Bronchitis with chronic Pseudomonas Infection   TEST/EVENTS :  06/2016 HRCT Chest >Fibrotic interstitial lung disease characterized by patchy peribronchovascular and subpleural reticulation, mild traction bronchiectasis, mild architectural distortion, mosaic attenuation with air trapping and scattered predominantly centrilobular nodularity. No frank honeycombing. No clear basilar gradient. Findings have progressed compared to Feb 24, 202013 chest CT, with minimal interval progression since 01/22/2016. Findings are favored represent chronic hypersensitivity pneumonitis. Findings are not consistent with usual interstitial pneumonia (UIP). 3. Mild centrilobular and paraseptal emphysema with mild diffuse bronchial wall thickening, suggesting COPD . pfts 2011: FEV1 1.34 (55%), ratio 68, ++airtrapping on lung volumes, nl TLC, DLCO 40% pred. PFT's 03/23/2015 FEV1 1.61 (68 % ) ratio 69 p no % improvement from saba on Symb/spiriva  Echo 2012: nl LV and EF, impaired relaxation, nothing to suggest pulm htn. Myoview 2012: no ischemia LHC 12/2011: non-obstructive cad RHC 12/2011: PA 45/29 with mean 34m, PCWP 22, CO Fick 10.25, PVR 1.17 wood units, slight step up in saturation from RV to PA RLihue7/2013: Equal 4 chamber pressures, but no square root sign.  Echo 72/72/5366 Nl LV, diastolic dysfxn, normal RV, estimated RVSP 46.  TEE 03/2012: A few late bubbles seen felt c/w small IP shunting.  CT chest 12/2011: No PE, very mild  mosaic perfusion abnl (prob secondary to her known airflow obstruction).  Cardiac MRI 2013: No infiltrative process.  TCD bubble study 03/2012: + for medium intracardiac right to left shunt.  07/04/2015 RHC: RA mean 9 RV 45/10 PA 41/20, mean 30 PCWP mean 13 Oxygen saturations: SVC 68% RA 65% RV 61% PA 69% LA 98% Peripheral sat 93% Cardiac Output (Fick) 6.7 Cardiac Index (Fick) 3.3 PVR 2.5 WU Qp/Qs = 0.97 06/2016 HRCT>Upper lobe predominant fibrotic change and with centrilobular emphysema, no honeycombing January 2018 pulmonary function testing ratio 70%, FEV1 1.63 L 70% predicted, FVC 2.33 L 76% predicted, total lung capacity 4.48 L 86% predicted, DLCO 5. 09/06/2019 percent predicted BAL 07/2016 >+few psuedomonas-pan sens Daily azithromycin started 2018 Chronic steroids with prednisone 5 mg daily to January 2019 Echo: 12/2017 TTE: LVEF 55%, no LVH, mild RV dilation, no PA pressure estimate March 2019 started on tobramycin nebulizer twice daily every other month 03/2018 Trial of Brovana /Pulmicort neb , no benefit, changed back to Symbicort  Follow up : COPD , ILD , O2 RF , Pulmonary HTN  Patient presents for a 236-monthollow-up.  Patient has underlying COPD and interstitial lung disease.  She is oxygen dependent on 5 L at rest and 8 L with activity.  She is on Symbicort, Spiriva and chronic steroids with prednisone 5 mg daily.  She is also on azithromycin daily.  Patient is accompanied by her husband.  Says that she has been getting progressively weaker.  Has a hard time transferring at home.  Had muscle aches and weakness.  No fever or increased cough or congestion.  Has chronic leg swelling. Has chronic bronchitis with previous Pseudomonas.  And is on tobramycin nebulizers every other month. Today in the office patient is extremely weak.  O2  saturations are adequate on 5 L.  She has increased leg swelling.  And heart rate is elevated in the 130s..  Allergies  Allergen Reactions  .  Aspirin Shortness Of Breath  . Nsaids Shortness Of Breath    Immunization History  Administered Date(s) Administered  . Influenza Split 02/17/2011, 02/19/2012, 02/24/2015  . Influenza, High Dose Seasonal PF 03/10/2016, 03/26/2017, 03/04/2018  . Influenza,inj,Quad PF,6+ Mos 03/08/2013  . Influenza-Unspecified 01/31/2014  . Pneumococcal Conjugate-13 08/18/2008    Past Medical History:  Diagnosis Date  . Adenomatous colon polyp   . ALLERGIC RHINITIS   . Anemia    iron deficient  . Anxiety   . Atrial tachycardia (HCC)    Mostly Sinus Tachycardia  . Back pain, chronic   . Carotid stenosis   . CHF (congestive heart failure) ([place])   . Community acquired pneumonia - Recent Admission (07/2013) 07/08/2013  . COPD (chronic obstructive pulmonary disease) ([place])    Requiring Home O2 at 4L  . Depression   . Diabetes mellitus type 2 with neurological manifestations ([place])    On Insulin  . Diverticulosis of colon   . External hemorrhoids   . Fibromyalgia    "pain in arms and shoulders."  . Gastritis   . GERD (gastroesophageal reflux disease)   . Headache(784.0)    tension  . Hyperlipemia   . Hypertension   . Inappropriate sinus tachycardia   . Morbidly obese ([place])   . Nephrolithiasis   . Neuromuscular disorder ([place])    diabetic neuropathy  . Osteoarthritis   . Osteoporosis   . PAF (paroxysmal atrial fibrillation) ([place])   . 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  . Sleep apnea    no cpap machine  02 at 4l/min all the time  . Urosepsis 05/26/2012    Tobacco History: Social History   Tobacco Use  Smoking Status Former Smoker  . Packs/day: 2.00  . Years: 30.00  . Pack years: 60.00  . Types: Cigarettes  . Last attempt to quit: 06/02/1994  . Years since quitting: 24.4  Smokeless Tobacco Never Used   Counseling given: Not Answered   No facility-administered medications prior to visit.    Outpatient Medications Prior to Visit  Medication Sig Dispense Refill  . acetaminophen (TYLENOL)  500 MG tablet Take 1 tablet (500 mg total) by mouth every 6 (six) hours as needed for mild pain or headache. 30 tablet 0  . albuterol (PROVENTIL) (2.5 MG/3ML) 0.083% nebulizer solution USE 1 VIAL VIA NEBULIZER EVERY 6 HOURS AS NEEDED FOR WHEEZING OR SHORTNESS OF BREATH 120 mL 5  . ALPRAZolam (XANAX) 0.5 MG tablet Take 0.25-0.5 mg by mouth daily as needed for anxiety.   4  . atorvastatin (LIPITOR) 40 MG tablet Take 40 mg by mouth at bedtime.     Marland Kitchen azithromycin (ZITHROMAX) 250 MG tablet TAKE 1 TABLET BY MOUTH DAILY. START AFTER FINISHING LEVAQUIN 90 tablet 3  . benzonatate (TESSALON) 200 MG capsule Take 400 mg by mouth at bedtime.     Marland Kitchen buPROPion (WELLBUTRIN XL) 300 MG 24 hr tablet Take 300 mg by mouth daily.    . DULoxetine (CYMBALTA) 60 MG capsule Take 60 mg by mouth daily.     Marland Kitchen gabapentin (NEURONTIN) 600 MG tablet Take 600 mg by mouth 3 (three) times daily.     . Guaifenesin (MUCINEX MAXIMUM STRENGTH) 1200 MG TB12 Take 1,200 mg by mouth 2 (two) times daily as needed (for cough or congestion).     Marland Kitchen HYDROcodone-acetaminophen (NORCO/VICODIN) 5-325 MG per  tablet Take 1 tablet by mouth See admin instructions. EVERY 4-6 HOURS AS NEEDED FOR PAIN OR SHORTNESS OF BREATH    . insulin aspart (NOVOLOG FLEXPEN) 100 UNIT/ML FlexPen Inject 5 Units into the skin 3 (three) times daily as needed for high blood sugar.    . insulin glargine (LANTUS) 100 UNIT/ML injection Inject 0.66 mLs (66 Units total) into the skin daily before breakfast. (Patient taking differently: Inject 64 Units into the skin daily before breakfast. )    . iron polysaccharides (NIFEREX) 150 MG capsule Take 1 capsule (150 mg total) by mouth 2 (two) times daily. (Patient taking differently: Take 150 mg by mouth daily. ) 60 capsule 0  . levalbuterol (XOPENEX HFA) 45 MCG/ACT inhaler Inhale 1-2 puffs into the lungs every 4 (four) hours as needed for wheezing. (Patient taking differently: Inhale 2 puffs into the lungs every 4 (four) hours as needed for  wheezing or shortness of breath. ) 1 Inhaler 12  . metolazone (ZAROXOLYN) 5 MG tablet Take 5 mg by mouth daily as needed.    . Multiple Vitamin (MULTIVITAMIN WITH MINERALS) TABS tablet Take 1 tablet by mouth daily.    Marland Kitchen omeprazole (PRILOSEC OTC) 20 MG tablet Take 20 mg by mouth at bedtime.    . OXYGEN Inhale 5-8 L into the lungs See admin instructions. 5 liters when at rest and 8 liters when ambulatory    . polyethylene glycol (MIRALAX / GLYCOLAX) packet Take 17 g by mouth daily. Mix in 8 oz liquid and drink    . polyvinyl alcohol (ARTIFICIAL TEARS) 1.4 % ophthalmic solution Place 1 drop into both eyes 2 (two) times daily.     . potassium chloride (K-DUR) 10 MEQ tablet TAKE 3 TABLETS BY MOUTH TWICE DAILY. 540 tablet 2  . predniSONE (DELTASONE) 5 MG tablet TAKE 1 TABLET(5 MG) BY MOUTH DAILY WITH BREAKFAST 30 tablet 3  . sodium chloride HYPERTONIC 3 % nebulizer solution USE 1 VIAL IN NEBULIZER TWO TIMES DAILY. 240 mL 3  . SPIRIVA HANDIHALER 18 MCG inhalation capsule INHALE THE CONTENTS OF 1 CAPSULE VIA HANDIHALER BY MOUTH EVERY MORNING 30 capsule 5  . SYMBICORT 160-4.5 MCG/ACT inhaler INHALE 2 PUFFS BY MOUTH TWICE DAILY 10.2 g 5  . tobramycin, PF, (TOBI) 300 MG/5ML nebulizer solution Take 5 mLs (300 mg total) by nebulization 2 (two) times daily. Every other month DX: J44.9 300 mL 5  . torsemide (DEMADEX) 20 MG tablet Take 80 mg (4 tabs) in am and 40 mg (2 tabs) in pm 180 tablet 11  . verapamil (VERELAN PM) 360 MG 24 hr capsule TAKE 1 CAPSULE(360 MG) BY MOUTH DAILY 30 capsule 3  . XARELTO 20 MG TABS tablet Take 20 mg by mouth daily with supper.  6  . tobramycin, PF, (TOBI) 300 MG/5ML nebulizer solution Take twice daily every other month (Patient not taking: Reported on 11/08/2018) 280 mL 0     Review of Systems:   Constitutional:   No  weight loss, night sweats,  Fevers, chills,  +fatigue, or  Lassitude. + body aches   HEENT:   No headaches,  Difficulty swallowing,  Tooth/dental problems, or   Sore throat,                No sneezing, itching, ear ache, nasal congestion, post nasal drip,   CV:  No chest pain,  Orthopnea, PND, swelling in lower extremities, anasarca, dizziness, palpitations, syncope.   GI  No heartburn, indigestion, abdominal pain, nausea, vomiting, diarrhea, change in  bowel habits, loss of appetite, bloody stools.   Resp:    No chest wall deformity  Skin: no rash or lesions.  GU: no dysuria, change in color of urine, no urgency or frequency.  No flank pain, no hematuria   MS:  No joint pain or swelling.  No decreased range of motion.  No back pain.    Physical Exam  BP 116/66 (BP Location: Left Arm, Patient Position: Sitting, Cuff Size: Normal)   Pulse (!) 135   Temp 98.2 F (36.8 C)   SpO2 98% Comment: 8L continuous  GEN: A/Ox3; pleasant , NAD, elderly , chronically ill appearing on O2    HEENT:  [place]/AT,   NOSE-clear, THROAT-clear, no lesions, no postnasal drip or exudate noted.   NECK:  Supple w/ fair ROM; no JVD; normal carotid impulses w/o bruits; no thyromegaly or nodules palpated; no lymphadenopathy.    RESP  Coarse Rhonchi bilaterally   no accessory muscle use, no dullness to percussion  CARD:  RRR, no m/r/g, 2+  peripheral edema, pulses intact, no cyanosis or clubbing.  GI:   Soft & nt; nml bowel sounds; no organomegaly or masses detected.   Musco: Warm bil, no deformities or joint swelling noted.   Neuro: alert, no focal deficits noted.    Skin: Warm, skin ulcerations LE , weeping     Lab Results:  CBC    Component Value Date/Time   WBC 9.0 11/08/2018 1708   RBC 4.32 11/08/2018 1708   HGB 9.6 (L) 11/08/2018 1708   HCT 35.3 (L) 11/08/2018 1708   PLT 272 11/08/2018 1708   MCV 81.7 11/08/2018 1708   MCH 22.2 (L) 11/08/2018 1708   MCHC 27.2 (L) 11/08/2018 1708   RDW 18.1 (H) 11/08/2018 1708   LYMPHSABS 1.5 11/08/2018 1708   MONOABS 0.6 11/08/2018 1708   EOSABS 0.1 11/08/2018 1708   BASOSABS 0.0 11/08/2018 1708    BMET     Component Value Date/Time   NA 140 06/16/2018 1149   K 4.5 06/16/2018 1149   CL 101 06/16/2018 1149   CO2 29 06/16/2018 1149   GLUCOSE 106 (H) 06/16/2018 1149   BUN 15 06/16/2018 1149   CREATININE 0.86 06/16/2018 1149   CREATININE 0.85 02/12/2015 0949   CALCIUM 9.0 06/16/2018 1149   GFRNONAA >60 06/16/2018 1149   GFRAA >60 06/16/2018 1149    BNP    Component Value Date/Time   BNP 21.2 01/29/2017 0544    ProBNP    Component Value Date/Time   PROBNP 42 11/25/2016 1518   PROBNP 316.0 (H) 07/08/2013 1425    Imaging: Dg Chest Portable 1 View  Result Date: 11/08/2018 CLINICAL DATA:  [place].  Weakness. EXAM: PORTABLE CHEST 1 VIEW COMPARISON:  March 12, 2018 and November 10, 2017 FINDINGS: The cardiomediastinal silhouette is stable. No pneumothorax. Bibasilar opacities are similar since previous studies. The opacity may be a little greater in the left base in the interval. IMPRESSION: 1. Bibasilar opacities are identified. The opacity in the right base is stable and chronic. The opacity in left base is mildly more prominent the interval and could represent increased atelectasis or a developing infiltrate superimposed on a chronic left basilar process. Electronically Signed   By: Dorise Bullion III M.D   On: 11/08/2018 17:16      PFT Results Latest Ref Rng & Units 06/15/2017 06/26/2016 03/23/2015  FVC-Pre L 2.29 2.30 2.42  FVC-Predicted Pre % 75 75 77  FVC-Post L 2.28 2.33 2.34  FVC-Predicted Post %  75 76 75  Pre FEV1/FVC % % 57 65 67  Post FEV1/FCV % % 66 70 69  FEV1-Pre L 1.30 1.50 1.61  FEV1-Predicted Pre % 57 65 68  FEV1-Post L 1.50 1.63 1.61  DLCO UNC% % 20 21 -  DLCO COR %Predicted % 26 29 -  TLC L 5.00 4.48 4.67  TLC % Predicted % 96 86 89  RV % Predicted % 126 100 111    No results found for: NITRICOXIDE      Assessment & Plan:   Chronic diastolic CHF (congestive heart failure) (HCC) Suspected decompensated CHF , +/- COPD exacerbation  Will need further  evaluation as very weak , tachycardic in office and appears fluid overload  EMS with transport to ER . Report given   Plan  ER via EMS for further evaluation and treatment options       Rexene Edison, NP 11/08/2018

## 2018-11-08 NOTE — Progress Notes (Signed)
Patient has now been titrated to a HFNC salter. Tolerating it well. Patient is on the phone. No respiratory distress noted

## 2018-11-08 NOTE — H&P (Signed)
History and Physical   NAKEITHA MILLIGAN Shelton:[id number] DOB: 1945/10/04 DOA: 11/08/2018  Referring MD/NP/PA: Dr. Lita Mains  PCP: Marton Redwood, MD   Outpatient Specialists: Pulmonary  Patient coming from: Home  Chief Complaint: Shortness of breath and cough  HPI: Destiny Shelton is a 73 y.o. female with medical history significant of COPD, chronic Pseudomonas colonization, interstitial lung disease, recurrent community-acquired pneumonia, diabetes, anxiety disorder, depression, GERD, paroxysmal atrial fibrillation who came to the ER with progressive shortness of breath and cough.  Patient was previously on 4 L of oxygen at home but now requiring up to 6 L in the hospital.  Denied any fever or chills.  Denied any nausea vomiting or diarrhea.  Patient is requiring nebulizer treatments with none minimal response in the ER so she is being admitted to the hospital for work-up.  She was noted to have increased fluids overall.  Prior to admission she was quite a nonrebreather back currently on 8 L.  Patient being admitted with acute on chronic respiratory failure.  She is also suspected to have some CHF with increased BNP but more importantly patient's increased BNP of up to 400.  She is also wheezing noticeably.  Oxygen sat drops at 80% on the 4 L and is only 90% on 6 L when she arrived.  She is being admitted to the medical service..  ED Course: Temperature is 98.3 blood pressure 91/71 pulse 135 respirate of 31 oxygen sat 84% on 4 L 94% on 8 L.  She has a white count of 9.0 hemoglobin 9.6 and platelets 272.  Troponin 0 0.03 with calcium 8.7 otherwise chemistry appears to be within normal.  Chest x-ray showed bibasilar opacities suspected pneumonia.  Also possible pulmonary edema.  Review of Systems: As per HPI otherwise 10 point review of systems negative.    Past Medical History:  Diagnosis Date  . Adenomatous colon polyp   . ALLERGIC RHINITIS   . Anemia    iron deficient  . Anxiety   . Atrial  tachycardia (HCC)    Mostly Sinus Tachycardia  . Back pain, chronic   . Carotid stenosis   . CHF (congestive heart failure) ([place])   . Community acquired pneumonia - Recent Admission (07/2013) 07/08/2013  . COPD (chronic obstructive pulmonary disease) ([place])    Requiring Home O2 at 4L  . Depression   . Diabetes mellitus type 2 with neurological manifestations ([place])    On Insulin  . Diverticulosis of colon   . External hemorrhoids   . Fibromyalgia    "pain in arms and shoulders."  . Gastritis   . GERD (gastroesophageal reflux disease)   . Headache(784.0)    tension  . Hyperlipemia   . Hypertension   . Inappropriate sinus tachycardia   . Morbidly obese ([place])   . Nephrolithiasis   . Neuromuscular disorder ([place])    diabetic neuropathy  . Osteoarthritis   . Osteoporosis   . PAF (paroxysmal atrial fibrillation) ([place])   . 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  . Sleep apnea    no cpap machine  02 at 4l/min all the time  . Urosepsis 05/26/2012    Past Surgical History:  Procedure Laterality Date  . ABDOMINAL HYSTERECTOMY  1978  . APPENDECTOMY  1963  . CARDIAC CATHETERIZATION N/A 07/04/2015   Procedure: Right Heart Cath;  Surgeon: Larey Dresser, MD;  Location: [facility];  Service: Cardiovascular;  Laterality: N/A;  . CHOLECYSTECTOMY  1963  . COLONOSCOPY N/A 12/21/2012   Procedure: COLONOSCOPY;  Surgeon: Gatha Mayer, MD;  Location: [facility];  Service: Endoscopy;  Laterality: N/A;  . COLONOSCOPY N/A 11/17/2013   Procedure: COLONOSCOPY;  Surgeon: Gatha Mayer, MD;  Location: [facility];  Service: Endoscopy;  Laterality: N/A;  . COLONOSCOPY W/ BIOPSIES AND POLYPECTOMY  07/22/2007, 04/28/2007   2009: diverticulosis, 2008: diverticulosis, adenomatous polyps  . COLONOSCOPY WITH PROPOFOL N/A 10/01/2012   Procedure: COLONOSCOPY WITH PROPOFOL;  Surgeon: Gatha Mayer, MD;  Location: [facility];  Service: Endoscopy;  Laterality: N/A;  . COLONOSCOPY WITH PROPOFOL N/A 10/10/2014    Procedure: COLONOSCOPY WITH PROPOFOL;  Surgeon: Gatha Mayer, MD;  Location: [facility];  Service: Endoscopy;  Laterality: N/A;  . ESOPHAGOGASTRODUODENOSCOPY (EGD) WITH PROPOFOL N/A 10/01/2012   Procedure: ESOPHAGOGASTRODUODENOSCOPY (EGD) WITH PROPOFOL;  Surgeon: Gatha Mayer, MD;  Location: [facility];  Service: Endoscopy;  Laterality: N/A;  . HOT HEMOSTASIS N/A 12/21/2012   Procedure: HOT HEMOSTASIS (ARGON PLASMA COAGULATION/BICAP);  Surgeon: Gatha Mayer, MD;  Location: [facility];  Service: Endoscopy;  Laterality: N/A;  . HOT HEMOSTASIS N/A 10/10/2014   Procedure: HOT HEMOSTASIS (ARGON PLASMA COAGULATION/BICAP);  Surgeon: Gatha Mayer, MD;  Location: [facility];  Service: Endoscopy;  Laterality: N/A;  . LEFT AND RIGHT HEART CATHETERIZATION WITH CORONARY ANGIOGRAM  12/19/2011   Procedure: LEFT AND RIGHT HEART CATHETERIZATION WITH CORONARY ANGIOGRAM;  Surgeon: Leonie Man, MD;  Location: [facility];  Service: Cardiovascular;;  . RIGHT AND LEFT CARDIAC CATHETERIZATION  July 2013   RAP: 22 mmHg, and RVP: 46/16 mmHg, RV EDP 18; L. BP 110/11 mmHg, LVEDP 20 mmHg;  PAP 45/29 mmHg (mean 37 mmHg); PCWP 29/22 mmHg - 26 mmHg; no significant coronary disease  . TEE WITHOUT CARDIOVERSION  03/24/2012   Procedure: TRANSESOPHAGEAL ECHOCARDIOGRAM (TEE);  Surgeon: Pixie Casino, MD;  Location: [facility] OR;  Service: Cardiovascular;  Laterality: N/A;  TEE with Bubble  . TOTAL KNEE ARTHROPLASTY  1997   right  . UPPER GASTROINTESTINAL ENDOSCOPY  04/09/2006   w/Dilation, gastritis  . VIDEO BRONCHOSCOPY Bilateral 07/22/2016   Procedure: VIDEO BRONCHOSCOPY WITH FLUORO;  Surgeon: Juanito Doom, MD;  Location: [facility];  Service: Cardiopulmonary;  Laterality: Bilateral;     reports that she quit smoking about 24 years ago. Her smoking use included cigarettes. She has a 60.00 pack-year smoking history. She has never used smokeless tobacco. She reports that she does not drink alcohol or use drugs.   Allergies  Allergen Reactions  . Aspirin Shortness Of Breath  . Nsaids Shortness Of Breath    Family History  Problem Relation Age of Onset  . Heart disease Mother   . Stroke Mother   . Heart disease Father   . Heart disease Brother   . Stroke Brother   . Skin cancer Brother   . Colon polyps Sister   . Diabetes Sister   . Diabetes Brother   . Irritable bowel syndrome Daughter   . Colon cancer Neg Hx      Prior to Admission medications   Medication Sig Start Date End Date Taking? Authorizing Provider  acetaminophen (TYLENOL) 500 MG tablet Take 1 tablet (500 mg total) by mouth every 6 (six) hours as needed for mild pain or headache. 02/01/17   Arrien, Jimmy Picket, MD  albuterol (PROVENTIL) (2.5 MG/3ML) 0.083% nebulizer solution USE 1 VIAL VIA NEBULIZER EVERY 6 HOURS AS NEEDED FOR WHEEZING OR SHORTNESS OF BREATH 05/03/18   Juanito Doom, MD  ALPRAZolam Duanne Moron) 0.5 MG tablet Take 0.25-0.5 mg  by mouth daily as needed for anxiety.  02/06/17   [provider]  atorvastatin (LIPITOR) 40 MG tablet Take 40 mg by mouth at bedtime.  06/06/13   [provider]  azithromycin (ZITHROMAX) 250 MG tablet TAKE 1 TABLET BY MOUTH DAILY. START AFTER Laqueta Linden 06/19/18   Juanito Doom, MD  benzonatate (TESSALON) 200 MG capsule Take 400 mg by mouth at bedtime.     [provider]  buPROPion (WELLBUTRIN XL) 300 MG 24 hr tablet Take 300 mg by mouth daily. 03/03/16   [provider]  DULoxetine (CYMBALTA) 60 MG capsule Take 60 mg by mouth daily.     [provider]  gabapentin (NEURONTIN) 600 MG tablet Take 600 mg by mouth 3 (three) times daily.     [provider]  Guaifenesin (MUCINEX MAXIMUM STRENGTH) 1200 MG TB12 Take 1,200 mg by mouth 2 (two) times daily as needed (for cough or congestion).     [provider]  HYDROcodone-acetaminophen (NORCO/VICODIN) 5-325 MG per tablet Take 1 tablet by mouth See admin instructions. EVERY 4-6  HOURS AS NEEDED FOR PAIN OR SHORTNESS OF BREATH    [provider]  insulin aspart (NOVOLOG FLEXPEN) 100 UNIT/ML FlexPen Inject 5 Units into the skin 3 (three) times daily as needed for high blood sugar.    [provider]  insulin glargine (LANTUS) 100 UNIT/ML injection Inject 0.66 mLs (66 Units total) into the skin daily before breakfast. Patient taking differently: Inject 64 Units into the skin daily before breakfast.  01/24/16   Janece Canterbury, MD  iron polysaccharides (NIFEREX) 150 MG capsule Take 1 capsule (150 mg total) by mouth 2 (two) times daily. Patient taking differently: Take 150 mg by mouth daily.  01/24/16   Janece Canterbury, MD  levalbuterol [facility] HFA) 45 MCG/ACT inhaler Inhale 1-2 puffs into the lungs every 4 (four) hours as needed for wheezing. Patient taking differently: Inhale 2 puffs into the lungs every 4 (four) hours as needed for wheezing or shortness of breath.  06/01/12   Marton Redwood, MD  metolazone (ZAROXOLYN) 5 MG tablet Take 5 mg by mouth daily as needed.    [provider]  Multiple Vitamin (MULTIVITAMIN WITH MINERALS) TABS tablet Take 1 tablet by mouth daily.    [provider]  omeprazole (PRILOSEC OTC) 20 MG tablet Take 20 mg by mouth at bedtime.    [provider]  OXYGEN Inhale 5-8 L into the lungs See admin instructions. 5 liters when at rest and 8 liters when ambulatory    [provider]  polyethylene glycol (MIRALAX / GLYCOLAX) packet Take 17 g by mouth daily. Mix in 8 oz liquid and drink    [provider]  polyvinyl alcohol (ARTIFICIAL TEARS) 1.4 % ophthalmic solution Place 1 drop into both eyes 2 (two) times daily.     [provider]  potassium chloride (K-DUR) 10 MEQ tablet TAKE 3 TABLETS BY MOUTH TWICE DAILY. 10/04/18   Larey Dresser, MD  predniSONE (DELTASONE) 5 MG tablet TAKE 1 TABLET(5 MG) BY MOUTH DAILY WITH BREAKFAST 08/09/18   Simonne Maffucci B, MD  sodium chloride  HYPERTONIC 3 % nebulizer solution USE 1 VIAL IN NEBULIZER TWO TIMES DAILY. 06/07/18   Juanito Doom, MD  SPIRIVA HANDIHALER 18 MCG inhalation capsule INHALE THE CONTENTS OF 1 CAPSULE VIA HANDIHALER BY MOUTH EVERY MORNING 08/05/18   Juanito Doom, MD  SYMBICORT 160-4.5 MCG/ACT inhaler INHALE 2 PUFFS BY MOUTH TWICE DAILY 07/16/18  Juanito Doom, MD  tobramycin, PF, (TOBI) 300 MG/5ML nebulizer solution Take 5 mLs (300 mg total) by nebulization 2 (two) times daily. Every other month DX: J44.9 01/20/18   Juanito Doom, MD  tobramycin, PF, (TOBI) 300 MG/5ML nebulizer solution Take twice daily every other month Patient not taking: Reported on 11/08/2018 07/28/18   Juanito Doom, MD  torsemide (DEMADEX) 20 MG tablet Take 80 mg (4 tabs) in am and 40 mg (2 tabs) in pm 01/11/18   Larey Dresser, MD  verapamil (VERELAN PM) 360 MG 24 hr capsule TAKE 1 CAPSULE(360 MG) BY MOUTH DAILY 09/02/18   Larey Dresser, MD  XARELTO 20 MG TABS tablet Take 20 mg by mouth daily with supper. 02/22/17   [provider]    Physical Exam: Vitals:   11/08/18 1745 11/08/18 1800 11/08/18 1815 11/08/18 1830  BP: (!) 99/58 (!) 104/51 91/71 (!) 110/58  Pulse: 97 96 95 97  Resp: 17 19 19  (!) 31  Temp:      TempSrc:      SpO2: 100% 99% 100% 99%      Constitutional: Acutely ill looking in respiratory distress, using extra muscle respiration Vitals:   11/08/18 1745 11/08/18 1800 11/08/18 1815 11/08/18 1830  BP: (!) 99/58 (!) 104/51 91/71 (!) 110/58  Pulse: 97 96 95 97  Resp: 17 19 19  (!) 31  Temp:      TempSrc:      SpO2: 100% 99% 100% 99%   Eyes: PERRL, lids and conjunctivae normal ENMT: Mucous membranes are moist. Posterior pharynx clear of any exudate or lesions.Normal dentition.  Neck: normal, supple, no masses, no thyromegaly Respiratory: Decreased air entry, mild expiratory wheezing, diffuse crackles, increased use of accessory muscles.    Cardiovascular: Tachycardia, no murmurs / rubs /  gallops. No extremity edema. 2+ pedal pulses. No carotid bruits.  Abdomen: no tenderness, no masses palpated. No hepatosplenomegaly. Bowel sounds positive.  Musculoskeletal: no clubbing / cyanosis. No joint deformity upper and lower extremities. Good ROM, no contractures. Normal muscle tone.  Skin: no rashes, lesions, ulcers. No induration Neurologic: CN 2-12 grossly intact. Sensation intact, DTR normal. Strength 5/5 in all 4.  Psychiatric: Normal judgment and insight. Alert and oriented x 3. Normal mood.     Labs on Admission: I have personally reviewed following labs and imaging studies  CBC: Recent Labs  Lab 11/08/18 1708  WBC 9.0  NEUTROABS 6.8  HGB 9.6*  HCT 35.3*  MCV 81.7  PLT 681   Basic Metabolic Panel: Recent Labs  Lab 11/08/18 1708  NA 142  K 3.7  CL 100  CO2 29  GLUCOSE 75  BUN 11  CREATININE 0.98  CALCIUM 8.7*   GFR: CrCl cannot be calculated (Unknown ideal weight.). Liver Function Tests: Recent Labs  Lab 11/08/18 1708  AST 21  ALT 17  ALKPHOS 51  BILITOT 0.5  PROT 6.0*  ALBUMIN 3.2*   No results for input(s): LIPASE, AMYLASE in the last 168 hours. No results for input(s): AMMONIA in the last 168 hours. Coagulation Profile: Recent Labs  Lab 11/08/18 1708  INR 1.1   Cardiac Enzymes: Recent Labs  Lab 11/08/18 1708  TROPONINI 0.03*   BNP (last 3 results) No results for input(s): PROBNP in the last 8760 hours. HbA1C: No results for input(s): HGBA1C in the last 72 hours. CBG: No results for input(s): GLUCAP in the last 168 hours. Lipid Profile: No results for input(s): CHOL, HDL, LDLCALC, TRIG, CHOLHDL, LDLDIRECT in  the last 72 hours. Thyroid Function Tests: No results for input(s): TSH, T4TOTAL, FREET4, T3FREE, THYROIDAB in the last 72 hours. Anemia Panel: No results for input(s): VITAMINB12, FOLATE, FERRITIN, TIBC, IRON, RETICCTPCT in the last 72 hours. Urine analysis:    Component Value Date/Time   COLORURINE YELLOW 03/04/2018  1054   APPEARANCEUR CLEAR 03/04/2018 1054   LABSPEC 1.020 03/04/2018 1054   PHURINE 6.0 03/04/2018 1054   GLUCOSEU NEGATIVE 03/04/2018 1054   HGBUR NEGATIVE 03/04/2018 1054   [place] 03/04/2018 1054   KETONESUR NEGATIVE 03/04/2018 1054   PROTEINUR NEGATIVE 05/25/2016 0205   UROBILINOGEN 0.2 03/04/2018 1054   NITRITE NEGATIVE 03/04/2018 1054   LEUKOCYTESUR SMALL (A) 03/04/2018 1054   Sepsis Labs: @LABRCNTIP (procalcitonin:4,lacticidven:4) )No results found for this or any previous visit (from the past 240 hour(s)).   Radiological Exams on Admission: Dg Chest Portable 1 View  Result Date: 11/08/2018 CLINICAL DATA:  Rhonchi.  Weakness. EXAM: PORTABLE CHEST 1 VIEW COMPARISON:  March 12, 2018 and November 10, 2017 FINDINGS: The cardiomediastinal silhouette is stable. No pneumothorax. Bibasilar opacities are similar since previous studies. The opacity may be a little greater in the left base in the interval. IMPRESSION: 1. Bibasilar opacities are identified. The opacity in the right base is stable and chronic. The opacity in left base is mildly more prominent the interval and could represent increased atelectasis or a developing infiltrate superimposed on a chronic left basilar process. Electronically Signed   By: Dorise Bullion III M.D   On: 11/08/2018 17:16    EKG: Independently reviewed.  It shows sinus tachycardia with a rate of 102.  Multiple atrial premature complexes but no significant ST changes.  Assessment/Plan Principal Problem:   Acute on chronic respiratory failure with hypoxia and hypercapnia (HCC) Active Problems:   Diabetes mellitus, type II, insulin dependent (HCC)   Essential hypertension   Pulmonary hypertension (HCC)   PAF (paroxysmal atrial fibrillation) (HCC)   Chronic diastolic CHF (congestive heart failure) (HCC)   ILD (interstitial lung disease) ([place])     #1 acute on chronic respiratory failure with hypoxia: Most likely due to pneumonia with COPD and  CHF exacerbation.  Patient will be admitted.  Initiate IV antibiotics nebulizer treatment as well as steroids.  She is COVID-19 negative.  Also diurese gently.  #2 diabetes: Initiate sliding scale insulin with home regimen.  #3 pulmonary hypertension: Continue home regimen.  #4 atrial fibrillation: Appears to be in sinus rhythm now.  Continue with rate control and Xarelto.  #5 hypertension: Continue with home blood pressure medications.  #6 interstitial lung disease: Advanced.  Causing most of her respiratory distress.  Continue outpatient follow-up with pulmonology.   DVT prophylaxis: Xarelto Code Status: Full code Family Communication: Husband via phone Disposition Plan: To be determined Consults called: None Admission status: Inpatient  Severity of Illness: The appropriate patient status for this patient is INPATIENT. Inpatient status is judged to be reasonable and necessary in order to provide the required intensity of service to ensure the patient's safety. The patient's presenting symptoms, physical exam findings, and initial radiographic and laboratory data in the context of their chronic comorbidities is felt to place them at high risk for further clinical deterioration. Furthermore, it is not anticipated that the patient will be medically stable for discharge from the hospital within 2 midnights of admission. The following factors support the patient status of inpatient.   " The patient's presenting symptoms include shortness of breath and cough. " The worrisome physical exam findings include  expiratory wheezing with crackles. " The initial radiographic and laboratory data are worrisome because of severe hypoxemia. " The chronic co-morbidities include COPD with interstitial lung disease.   * I certify that at the point of admission it is my clinical judgment that the patient will require inpatient hospital care spanning beyond 2 midnights from the point of admission due to high  intensity of service, high risk for further deterioration and high frequency of surveillance required.Barbette Merino MD [facility] Pager [phone]  If 7PM-7AM, please contact night-coverage [email or web address] Password [facility]  11/08/2018, 6:43 PM

## 2018-11-08 NOTE — Progress Notes (Signed)
Called to room per RN due to oxygen desaturation in the 70-80's. On my arrival noted oxygen saturation in the 70's, and then rapid declined into the 60's. No cyanosis or acrocyanosis noted. Mottled skin tone noted. Patient was immediately placed on a NRB for oxygen desaturation and impending respiratory compromise. Patient current respirations are shallow in the low 30's and some mid/moderate accessory muscle use noted. Once on a NRB mask oxygen saturation slowly began to increase into acceptable ranges. ABG collected per protocol, and patient was on a NRB upon collection. ABG reveals pH7.38 /pCO2 59.2 /paO2 168/ Bicarb 34.6. Learned that patient has a chronic history of bronchitis, pseudomonas, and CHF/COPD. BBS to auscultation reveals decrease air exchange with air flow limitation and Coarse Crackles. Scheduled neb held due to hypoxia and increase oxygen demands. Will titrate oxygen therapy as tolerated. No further active orders. RRT will continue to monitor patient clinical status throughout the night as needed.   Julious Langlois L. Jennette Kettle, RRT, RCP

## 2018-11-08 NOTE — ED Notes (Signed)
Pt's husband Wynetta Emery) left number for updates [phone]

## 2018-11-08 NOTE — Progress Notes (Signed)
Pharmacy Antibiotic Note  Destiny Shelton is a 73 y.o. female admitted on 11/08/2018 with worsening respiratory distress, hypoxic into the 80s on her home 4LNC. Hx of chronic bronchitis with chronic pseudomonas infection. WBC 9.0 and afebrile. Last charted weight 90.4kg.  Scr 0.98 with CrCl estimated at 34mL/min. Pharmacy has been consulted for cefepime dosing.  Plan: Cefepime 1g x1 per MD Continue cefepime 2g q8hrs Monitor renal function, LOT, culture data  Temp (24hrs), Avg:98.3 F (36.8 C), Min:98.2 F (36.8 C), Max:98.3 F (36.8 C)  Recent Labs  Lab 11/08/18 1708  WBC 9.0  CREATININE 0.98    CrCl cannot be calculated (Unknown ideal weight.).    Allergies  Allergen Reactions  . Aspirin Shortness Of Breath  . Nsaids Shortness Of Breath    Antimicrobials this admission: Cefepime 6/8 >>  Thank you for allowing pharmacy to be a part of this patient's care.  Janae Bridgeman, PharmD PGY1 Pharmacy Resident Phone: [phone] 11/08/2018 8:14 PM

## 2018-11-08 NOTE — ED Provider Notes (Signed)
[facility] EMERGENCY DEPARTMENT Provider Note   CSN: [id number] Arrival date & time: 11/08/18  1657    History   Chief Complaint Chief Complaint  Patient presents with  . Shortness of Breath    HPI Destiny Shelton is a 73 y.o. female.     The history is provided by the patient, the EMS personnel and medical records.  Shortness of Breath  Severity:  Severe Onset quality:  Gradual Duration:  5 days Timing:  Intermittent Progression:  Worsening Chronicity:  Chronic Context: activity, smoke exposure, URI and weather changes   Relieved by:  Nothing Worsened by:  Activity, coughing, exertion, movement, stress and smoke exposure Ineffective treatments:  Oxygen, rest, sitting up, position changes, lying down, diuretics and inhaler Associated symptoms: cough, PND, sputum production and wheezing   Associated symptoms: no chest pain, no diaphoresis, no fever and no hemoptysis   Risk factors: hx of PE/DVT and tobacco use     Past Medical History:  Diagnosis Date  . Adenomatous colon polyp   . ALLERGIC RHINITIS   . Anemia    iron deficient  . Anxiety   . Atrial tachycardia (HCC)    Mostly Sinus Tachycardia  . Back pain, chronic   . Carotid stenosis   . CHF (congestive heart failure) ([place])   . Community acquired pneumonia - Recent Admission (07/2013) 07/08/2013  . COPD (chronic obstructive pulmonary disease) ([place])    Requiring Home O2 at 4L  . Depression   . Diabetes mellitus type 2 with neurological manifestations ([place])    On Insulin  . Diverticulosis of colon   . External hemorrhoids   . Fibromyalgia    "pain in arms and shoulders."  . Gastritis   . GERD (gastroesophageal reflux disease)   . Headache(784.0)    tension  . Hyperlipemia   . Hypertension   . Inappropriate sinus tachycardia   . Morbidly obese ([place])   . Nephrolithiasis   . Neuromuscular disorder ([place])    diabetic neuropathy  . Osteoarthritis   . Osteoporosis   . PAF (paroxysmal  atrial fibrillation) ([place])   . 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  . Sleep apnea    no cpap machine  02 at 4l/min all the time  . Urosepsis 05/26/2012    Patient Active Problem List   Diagnosis Date Noted  . Acute on chronic respiratory failure with hypoxia and hypercapnia ([place]) 11/08/2018  . Epistaxis 03/02/2017  . Enterococcal bacteremia 01/30/2017  . CAP (community acquired pneumonia) 01/29/2017  . Sepsis ([place]) 01/29/2017  . Bleeding from the nose 01/29/2017  . ILD (interstitial lung disease) ([place]) 06/26/2016  . Fall   . Hyponatremia 05/25/2016  . Metabolic acidosis 50/93/2671  . COPD (chronic obstructive pulmonary disease) ([place]) 05/25/2016  . COPD exacerbation ([place]) 01/22/2016  . Biomechanical lesion of rib cage 01/22/2016  . Anemia 01/22/2016  . Chronic diastolic CHF (congestive heart failure) ([place]) 12/12/2015  . Morbid obesity ([place]) 12/29/2014  . Benign neoplasm of sigmoid colon   . Chronic diastolic heart failure of unknown etiology ([place]) 08/17/2013  . Chronic respiratory failure ([place]) 04/21/2013  . Polyposis coli-attenuated 01/24/2013  . Hypokalemia 10/02/2012  . Sigmoid tubular adenoma with high-grade dysplasia 10/01/2012  . Gastric polyp 10/01/2012  . Schatzki's ring 10/01/2012  . PAF (paroxysmal atrial fibrillation) ([place]) 05/29/2012  . Pulmonary hypertension ([place]) 04/15/2012  . Inappropriate sinus tachycardia   . Obesity, Class II, BMI 35-39.9, with comorbidity (HCC)     Class: Chronic  . History of failed  moderate sedation 06/10/2011  . Pulmonary nodules 09/11/2010  . Centrilobular emphysema ([place]) 09/11/2010  . CHRONIC DYSPNEA 07/26/2009  . Diabetes mellitus, type II, insulin dependent ([place]) 07/29/2007    Class: Diagnosis of  . DIABETIC PERIPHERAL NEUROPATHY 07/29/2007  . Anxiety state 07/29/2007  . Depression 07/29/2007  . Essential hypertension 07/29/2007  . NEPHROLITHIASIS 07/29/2007  . Osteoarthritis 07/29/2007  . Backache 07/29/2007  . Osteoporosis 07/29/2007   . Personal history of colonic polyps 04/28/2007    Past Surgical History:  Procedure Laterality Date  . ABDOMINAL HYSTERECTOMY  1978  . APPENDECTOMY  1963  . CARDIAC CATHETERIZATION N/A 07/04/2015   Procedure: Right Heart Cath;  Surgeon: Larey Dresser, MD;  Location: [facility];  Service: Cardiovascular;  Laterality: N/A;  . CHOLECYSTECTOMY  1963  . COLONOSCOPY N/A 12/21/2012   Procedure: COLONOSCOPY;  Surgeon: Gatha Mayer, MD;  Location: [facility];  Service: Endoscopy;  Laterality: N/A;  . COLONOSCOPY N/A 11/17/2013   Procedure: COLONOSCOPY;  Surgeon: Gatha Mayer, MD;  Location: [facility];  Service: Endoscopy;  Laterality: N/A;  . COLONOSCOPY W/ BIOPSIES AND POLYPECTOMY  07/22/2007, 04/28/2007   2009: diverticulosis, 2008: diverticulosis, adenomatous polyps  . COLONOSCOPY WITH PROPOFOL N/A 10/01/2012   Procedure: COLONOSCOPY WITH PROPOFOL;  Surgeon: Gatha Mayer, MD;  Location: [facility];  Service: Endoscopy;  Laterality: N/A;  . COLONOSCOPY WITH PROPOFOL N/A 10/10/2014   Procedure: COLONOSCOPY WITH PROPOFOL;  Surgeon: Gatha Mayer, MD;  Location: [facility];  Service: Endoscopy;  Laterality: N/A;  . ESOPHAGOGASTRODUODENOSCOPY (EGD) WITH PROPOFOL N/A 10/01/2012   Procedure: ESOPHAGOGASTRODUODENOSCOPY (EGD) WITH PROPOFOL;  Surgeon: Gatha Mayer, MD;  Location: [facility];  Service: Endoscopy;  Laterality: N/A;  . HOT HEMOSTASIS N/A 12/21/2012   Procedure: HOT HEMOSTASIS (ARGON PLASMA COAGULATION/BICAP);  Surgeon: Gatha Mayer, MD;  Location: [facility];  Service: Endoscopy;  Laterality: N/A;  . HOT HEMOSTASIS N/A 10/10/2014   Procedure: HOT HEMOSTASIS (ARGON PLASMA COAGULATION/BICAP);  Surgeon: Gatha Mayer, MD;  Location: [facility];  Service: Endoscopy;  Laterality: N/A;  . LEFT AND RIGHT HEART CATHETERIZATION WITH CORONARY ANGIOGRAM  12/19/2011   Procedure: LEFT AND RIGHT HEART CATHETERIZATION WITH CORONARY ANGIOGRAM;  Surgeon: Leonie Man, MD;   Location: [facility] CATH LAB;  Service: Cardiovascular;;  . RIGHT AND LEFT CARDIAC CATHETERIZATION  July 2013   RAP: 22 mmHg, and RVP: 46/16 mmHg, RV EDP 18; L. BP 110/11 mmHg, LVEDP 20 mmHg;  PAP 45/29 mmHg (mean 37 mmHg); PCWP 29/22 mmHg - 26 mmHg; no significant coronary disease  . TEE WITHOUT CARDIOVERSION  03/24/2012   Procedure: TRANSESOPHAGEAL ECHOCARDIOGRAM (TEE);  Surgeon: Pixie Casino, MD;  Location[facility] OR;  Service: Cardiovascular;  Laterality: N/A;  TEE with [place]  . TOTAL KNEE ARTHROPLASTY  1997   right  . UPPER GASTROINTESTINAL ENDOSCOPY  04/09/2006   w/Dilation, gastritis  . VIDEO BRONCHOSCOPY Bilateral 07/22/2016   Procedure: VIDEO BRONCHOSCOPY WITH FLUORO;  Surgeon: Juanito Doom, MD;  Location: [facility] ENDOSCOPY;  Service: Cardiopulmonary;  Laterality: Bilateral;     OB History   No obstetric history on file.      Home Medications    Prior to Admission medications   Medication Sig Start Date End Date Taking? Authorizing Provider  acetaminophen (TYLENOL) 500 MG tablet Take 1 tablet (500 mg total) by mouth every 6 (six) hours as needed for mild pain or headache. 02/01/17   Arrien, Jimmy Picket, MD  albuterol (PROVENTIL) (2.5 MG/3ML) 0.083% nebulizer solution USE 1  VIAL VIA NEBULIZER EVERY 6 HOURS AS NEEDED FOR WHEEZING OR SHORTNESS OF BREATH 05/03/18   Juanito Doom, MD  ALPRAZolam Duanne Moron) 0.5 MG tablet Take 0.25-0.5 mg by mouth daily as needed for anxiety.  02/06/17   [provider]  atorvastatin (LIPITOR) 40 MG tablet Take 40 mg by mouth at bedtime.  06/06/13   [provider]  azithromycin (ZITHROMAX) 250 MG tablet TAKE 1 TABLET BY MOUTH DAILY. START AFTER Laqueta Linden 06/19/18   Juanito Doom, MD  benzonatate (TESSALON) 200 MG capsule Take 400 mg by mouth at bedtime.     [provider]  buPROPion (WELLBUTRIN XL) 300 MG 24 hr tablet Take 300 mg by mouth daily. 03/03/16   [provider]  DULoxetine (CYMBALTA) 60 MG capsule  Take 60 mg by mouth daily.     [provider]  gabapentin (NEURONTIN) 600 MG tablet Take 600 mg by mouth 3 (three) times daily.     [provider]  Guaifenesin (MUCINEX MAXIMUM STRENGTH) 1200 MG TB12 Take 1,200 mg by mouth 2 (two) times daily as needed (for cough or congestion).     [provider]  HYDROcodone-acetaminophen (NORCO/VICODIN) 5-325 MG per tablet Take 1 tablet by mouth See admin instructions. EVERY 4-6 HOURS AS NEEDED FOR PAIN OR SHORTNESS OF BREATH    [provider]  insulin aspart (NOVOLOG FLEXPEN) 100 UNIT/ML FlexPen Inject 5 Units into the skin 3 (three) times daily as needed for high blood sugar.    [provider]  insulin glargine (LANTUS) 100 UNIT/ML injection Inject 0.66 mLs (66 Units total) into the skin daily before breakfast. Patient taking differently: Inject 64 Units into the skin daily before breakfast.  01/24/16   Janece Canterbury, MD  iron polysaccharides (NIFEREX) 150 MG capsule Take 1 capsule (150 mg total) by mouth 2 (two) times daily. Patient taking differently: Take 150 mg by mouth daily.  01/24/16   Janece Canterbury, MD  levalbuterol [facility] HFA) 45 MCG/ACT inhaler Inhale 1-2 puffs into the lungs every 4 (four) hours as needed for wheezing. Patient taking differently: Inhale 2 puffs into the lungs every 4 (four) hours as needed for wheezing or shortness of breath.  06/01/12   Marton Redwood, MD  metolazone (ZAROXOLYN) 5 MG tablet Take 5 mg by mouth daily as needed.    [provider]  Multiple Vitamin (MULTIVITAMIN WITH MINERALS) TABS tablet Take 1 tablet by mouth daily.    [provider]  omeprazole (PRILOSEC OTC) 20 MG tablet Take 20 mg by mouth at bedtime.    [provider]  OXYGEN Inhale 5-8 L into the lungs See admin instructions. 5 liters when at rest and 8 liters when ambulatory    [provider]  polyethylene glycol (MIRALAX / GLYCOLAX) packet Take 17 g by mouth daily. Mix  in 8 oz liquid and drink    [provider]  polyvinyl alcohol (ARTIFICIAL TEARS) 1.4 % ophthalmic solution Place 1 drop into both eyes 2 (two) times daily.     [provider]  potassium chloride (K-DUR) 10 MEQ tablet TAKE 3 TABLETS BY MOUTH TWICE DAILY. 10/04/18   Larey Dresser, MD  predniSONE (DELTASONE) 5 MG tablet TAKE 1 TABLET(5 MG) BY MOUTH DAILY WITH BREAKFAST 08/09/18   Simonne Maffucci B, MD  sodium chloride HYPERTONIC 3 % nebulizer solution USE 1 VIAL IN NEBULIZER TWO TIMES DAILY. 06/07/18   Juanito Doom, MD  SPIRIVA HANDIHALER 18 MCG inhalation capsule INHALE THE CONTENTS  OF 1 CAPSULE VIA HANDIHALER BY MOUTH EVERY MORNING 08/05/18   Juanito Doom, MD  SYMBICORT 160-4.5 MCG/ACT inhaler INHALE 2 PUFFS BY MOUTH TWICE DAILY 07/16/18   Juanito Doom, MD  tobramycin, PF, (TOBI) 300 MG/5ML nebulizer solution Take 5 mLs (300 mg total) by nebulization 2 (two) times daily. Every other month DX: J44.9 01/20/18   Juanito Doom, MD  tobramycin, PF, (TOBI) 300 MG/5ML nebulizer solution Take twice daily every other month Patient not taking: Reported on 11/08/2018 07/28/18   Juanito Doom, MD  torsemide (DEMADEX) 20 MG tablet Take 80 mg (4 tabs) in am and 40 mg (2 tabs) in pm 01/11/18   Larey Dresser, MD  verapamil (VERELAN PM) 360 MG 24 hr capsule TAKE 1 CAPSULE(360 MG) BY MOUTH DAILY 09/02/18   Larey Dresser, MD  XARELTO 20 MG TABS tablet Take 20 mg by mouth daily with supper. 02/22/17   [provider]    Family History Family History  Problem Relation Age of Onset  . Heart disease Mother   . Stroke Mother   . Heart disease Father   . Heart disease Brother   . Stroke Brother   . Skin cancer Brother   . Colon polyps Sister   . Diabetes Sister   . Diabetes Brother   . Irritable bowel syndrome Daughter   . Colon cancer Neg Hx     Social History Social History   Tobacco Use  . Smoking status: Former Smoker    Packs/day: 2.00    Years: 30.00     Pack years: 60.00    Types: Cigarettes    Last attempt to quit: 06/02/1994    Years since quitting: 24.4  . Smokeless tobacco: Never Used  Substance Use Topics  . Alcohol use: No  . Drug use: No     Allergies   Aspirin and Nsaids   Review of Systems Review of Systems  Constitutional: Negative for diaphoresis and fever.  Respiratory: Positive for cough, sputum production, shortness of breath and wheezing. Negative for hemoptysis.   Cardiovascular: Positive for PND. Negative for chest pain.  All other systems reviewed and are negative.    Physical Exam Updated Vital Signs BP (!) 110/58   Pulse 97   Temp 98.3 F (36.8 C) (Oral)   Resp (!) 31   SpO2 99%   Physical Exam Vitals signs and nursing note reviewed.  Constitutional:      Appearance: She is well-developed. She is ill-appearing.  HENT:     Head: Normocephalic and atraumatic.  Eyes:     Conjunctiva/sclera: Conjunctivae normal.  Neck:     Musculoskeletal: Neck supple.     Vascular: JVD present.  Cardiovascular:     Rate and Rhythm: Tachycardia present.  Pulmonary:     Effort: Respiratory distress present.     Breath sounds: Decreased breath sounds, wheezing and rhonchi present.     Comments: Coarse rhonchorous breath sounds, O2 sat 92% on 6 L nasal cannula Abdominal:     Palpations: Abdomen is soft.     Tenderness: There is no abdominal tenderness.  Musculoskeletal:     Right lower leg: Edema present.     Left lower leg: Edema present.     Comments: 2+ pitting edema bilateral lower extremities  Stasis ulcer left anterior lower extremity  Skin:    General: Skin is warm and dry.  Neurological:     Mental Status: She is alert and oriented to person, place, and  time.      ED Treatments / Results  Labs (all labs ordered are listed, but only abnormal results are displayed) Labs Reviewed  COMPREHENSIVE METABOLIC PANEL - Abnormal; Notable for the following components:      Result Value   Calcium 8.7  (*)    Total Protein 6.0 (*)    Albumin 3.2 (*)    GFR calc non Af Amer 57 (*)    All other components within normal limits  BRAIN NATRIURETIC PEPTIDE - Abnormal; Notable for the following components:   B Natriuretic Peptide 397.3 (*)    All other components within normal limits  TROPONIN I - Abnormal; Notable for the following components:   Troponin I 0.03 (*)    All other components within normal limits  CBC WITH DIFFERENTIAL/PLATELET - Abnormal; Notable for the following components:   Hemoglobin 9.6 (*)    HCT 35.3 (*)    MCH 22.2 (*)    MCHC 27.2 (*)    RDW 18.1 (*)    All other components within normal limits  SARS CORONAVIRUS 2 (HOSPITAL ORDER, [facility])  CULTURE, BLOOD (ROUTINE X 2)  CULTURE, BLOOD (ROUTINE X 2)  PROTIME-INR  URINALYSIS, ROUTINE W REFLEX MICROSCOPIC    EKG None  Radiology Dg Chest Portable 1 View  Result Date: 11/08/2018 CLINICAL DATA:  [place].  Weakness. EXAM: PORTABLE CHEST 1 VIEW COMPARISON:  March 12, 2018 and November 10, 2017 FINDINGS: The cardiomediastinal silhouette is stable. No pneumothorax. Bibasilar opacities are similar since previous studies. The opacity may be a little greater in the left base in the interval. IMPRESSION: 1. Bibasilar opacities are identified. The opacity in the right base is stable and chronic. The opacity in left base is mildly more prominent the interval and could represent increased atelectasis or a developing infiltrate superimposed on a chronic left basilar process. Electronically Signed   By: Dorise Bullion III M.D   On: 11/08/2018 17:16    Procedures Procedures (including critical care time)  Medications Ordered in ED Medications  vancomycin (VANCOCIN) 1,500 mg in sodium chloride 0.9 % 500 mL IVPB (has no administration in time range)  albuterol (VENTOLIN HFA) 108 (90 Base) MCG/ACT inhaler 8 puff (8 puffs Inhalation Given 11/08/18 1731)  predniSONE (DELTASONE) tablet 60 mg (60 mg Oral  Given 11/08/18 1725)  furosemide (LASIX) injection 20 mg (20 mg Intravenous Given 11/08/18 1727)  ceFEPIme (MAXIPIME) 1 g in sodium chloride 0.9 % 100 mL IVPB (1 g Intravenous New Bag/Given 11/08/18 1836)     Initial Impression / Assessment and Plan / ED Course  I have reviewed the triage vital signs and the nursing notes.  Pertinent labs & imaging results that were available during my care of the patient were reviewed by me and considered in my medical decision making (see chart for details).        Medical Decision Making: Destiny Shelton is a 73 y.o. female who presented to the ED today with worsening respiratory distress, hypoxic into the 80s on her home 4 L nasal cannula at rest, requiring around 6 L at rest to maintain O2 sats greater than 90%.  Past medical history significant for former smoker followed for COPD, pulmonary hypertension and ILD, on 4 L [place] O2 at home at rest 5 to 8 L nasal cannula with activity, and Chronic Bronchitis with chronic Pseudomonas Infection  Reviewed and confirmed nursing documentation for past medical history, family history, social history.  On my initial exam,  the pt was ill-appearing, tachycardic, O2 saturation 90% on 6 L nasal cannula, increased work of breathing, rhonchorous breath sounds throughout, not hypotensive, afebrile, mental status appropriate, conversant and interactive, GCS 15.  Beta agonist therapy, steroids, diuretics administered.  Etiology of symptoms unclear, could be progression of worsening interstitial lung disease and pulmonary hypertension, history of extensive smoking and obstructive lung disease could be contributory as well as heart failure history given volume overload with bilateral lower extremity pitting edema, JVD, cold infection could be contributory given chronic Pseudomonas colonizer, on outpatient antibiotics, patient denies fever and chills, afebrile in emergency department, will have low threshold for antibiotics.  All  radiology and laboratory studies reviewed independently and with my attending physician, agree with reading provided by radiologist unless otherwise noted.  COVID testing negative, BNP 400, previously was very low, troponin 0.03, no significant leukocytosis, hemoglobin 9.6, chest x-ray shows bilateral opacities, worsening left-sided, will start antibiotics Upon reassessing patient, patient was calm, O2 sat 92% on 6 L nasal cannula Based on the above findings, I believe patient requires admission.  Patient admitted. The above care was discussed with and agreed upon by my attending physician. Emergency Department Medication Summary:  Medications  vancomycin (VANCOCIN) 1,500 mg in sodium chloride 0.9 % 500 mL IVPB (has no administration in time range)  albuterol (VENTOLIN HFA) 108 (90 Base) MCG/ACT inhaler 8 puff (8 puffs Inhalation Given 11/08/18 1731)  predniSONE (DELTASONE) tablet 60 mg (60 mg Oral Given 11/08/18 1725)  furosemide (LASIX) injection 20 mg (20 mg Intravenous Given 11/08/18 1727)  ceFEPIme (MAXIPIME) 1 g in sodium chloride 0.9 % 100 mL IVPB (1 g Intravenous New Bag/Given 11/08/18 1836)        Final Clinical Impressions(s) / ED Diagnoses   Final diagnoses:  None    ED Discharge Orders    None       Lonzo Candy, MD 11/08/18 Dessa Phi, MD 11/09/18 [phone]

## 2018-11-09 ENCOUNTER — Encounter (HOSPITAL_COMMUNITY): Payer: Self-pay | Admitting: General Practice

## 2018-11-09 DIAGNOSIS — I5032 Chronic diastolic (congestive) heart failure: Secondary | ICD-10-CM

## 2018-11-09 DIAGNOSIS — Z7189 Other specified counseling: Secondary | ICD-10-CM

## 2018-11-09 DIAGNOSIS — I480 Paroxysmal atrial fibrillation: Secondary | ICD-10-CM

## 2018-11-09 DIAGNOSIS — I10 Essential (primary) hypertension: Secondary | ICD-10-CM

## 2018-11-09 DIAGNOSIS — Z515 Encounter for palliative care: Secondary | ICD-10-CM

## 2018-11-09 DIAGNOSIS — E119 Type 2 diabetes mellitus without complications: Secondary | ICD-10-CM

## 2018-11-09 DIAGNOSIS — J849 Interstitial pulmonary disease, unspecified: Secondary | ICD-10-CM

## 2018-11-09 DIAGNOSIS — Z794 Long term (current) use of insulin: Secondary | ICD-10-CM

## 2018-11-09 LAB — COMPREHENSIVE METABOLIC PANEL
ALT: 19 U/L (ref 0–44)
AST: 22 U/L (ref 15–41)
Albumin: 3.4 g/dL — ABNORMAL LOW (ref 3.5–5.0)
Alkaline Phosphatase: 55 U/L (ref 38–126)
Anion gap: 10 (ref 5–15)
BUN: 10 mg/dL (ref 8–23)
CO2: 35 mmol/L — ABNORMAL HIGH (ref 22–32)
Calcium: 8.7 mg/dL — ABNORMAL LOW (ref 8.9–10.3)
Chloride: 98 mmol/L (ref 98–111)
Creatinine, Ser: 0.89 mg/dL (ref 0.44–1.00)
GFR calc Af Amer: >60 mL/min (ref >60)
GFR calc non Af Amer: >60 mL/min (ref >60)
Glucose, Bld: 166 mg/dL — ABNORMAL HIGH (ref 70–99)
Potassium: 3.7 mmol/L (ref 3.5–5.1)
Sodium: 143 mmol/L (ref 135–145)
Total Bilirubin: 1 mg/dL (ref 0.3–1.2)
Total Protein: 6.4 g/dL — ABNORMAL LOW (ref 6.5–8.1)

## 2018-11-09 LAB — CBC
HCT: 36.7 % (ref 36.0–46.0)
Hemoglobin: 10.2 g/dL — ABNORMAL LOW (ref 12.0–15.0)
MCH: 22.2 pg — ABNORMAL LOW (ref 26.0–34.0)
MCHC: 27.8 g/dL — ABNORMAL LOW (ref 30.0–36.0)
MCV: 79.8 fL — ABNORMAL LOW (ref 80.0–100.0)
Platelets: 270 10*3/uL (ref 150–400)
RBC: 4.6 MIL/uL (ref 3.87–5.11)
RDW: 18.4 % — ABNORMAL HIGH (ref 11.5–15.5)
WBC: 6.5 10*3/uL (ref 4.0–10.5)
nRBC: 0 % (ref 0.0–0.2)

## 2018-11-09 LAB — GLUCOSE, CAPILLARY
Glucose-Capillary: 148 mg/dL — ABNORMAL HIGH (ref 70–99)
Glucose-Capillary: 160 mg/dL — ABNORMAL HIGH (ref 70–99)
Glucose-Capillary: 205 mg/dL — ABNORMAL HIGH (ref 70–99)
Glucose-Capillary: 207 mg/dL — ABNORMAL HIGH (ref 70–99)
Glucose-Capillary: 231 mg/dL — ABNORMAL HIGH (ref 70–99)

## 2018-11-09 MED ORDER — INSULIN ASPART 100 UNIT/ML ~~LOC~~ SOLN
0.0000 [IU] | Freq: Three times a day (TID) | SUBCUTANEOUS | Status: DC
  Administered 2018-11-09: 3 [IU] via SUBCUTANEOUS
  Administered 2018-11-09 – 2018-11-10 (×4): 5 [IU] via SUBCUTANEOUS
  Administered 2018-11-10: 3 [IU] via SUBCUTANEOUS
  Administered 2018-11-11: 2 [IU] via SUBCUTANEOUS
  Administered 2018-11-11 (×2): 3 [IU] via SUBCUTANEOUS
  Administered 2018-11-12 (×2): 8 [IU] via SUBCUTANEOUS

## 2018-11-09 MED ORDER — VERAPAMIL HCL ER 180 MG PO TBCR
360.0000 mg | EXTENDED_RELEASE_TABLET | Freq: Every day | ORAL | Status: DC
  Administered 2018-11-09 – 2018-11-12 (×4): 360 mg via ORAL
  Filled 2018-11-09 (×5): qty 2

## 2018-11-09 MED ORDER — INSULIN ASPART 100 UNIT/ML ~~LOC~~ SOLN
0.0000 [IU] | Freq: Every day | SUBCUTANEOUS | Status: DC
  Administered 2018-11-09: 2 [IU] via SUBCUTANEOUS

## 2018-11-09 NOTE — Progress Notes (Signed)
PROGRESS NOTE    Destiny Shelton  MWU:[id number] DOB: Oct 22, 1945 DOA: 11/08/2018 PCP: Marton Redwood, MD    Brief Narrative:   Destiny Shelton is a 73 y.o. female with medical history significant of COPD, chronic Pseudomonas colonization, interstitial lung disease, recurrent community-acquired pneumonia, diabetes, anxiety disorder, depression, GERD, paroxysmal atrial fibrillation who came to the ER with progressive shortness of breath and cough. She was admitted for acute on chronic respiratory failure with hypoxia and hypercapnia secondary to possible pneumonia and pulmonary edema.  On talking with her daughter, she reports that patient is on 6 lit of [place] at rest and on 8 lit of [place] oxygen on ambulation.   Currently patient is requiring 9 lit of HF [place] oxygen, reports breathing is better and wants to go home.    Assessment & Plan:   Principal Problem:   Acute on chronic respiratory failure with hypoxia and hypercapnia (HCC) Active Problems:   Diabetes mellitus, type II, insulin dependent (HCC)   Essential hypertension   Pulmonary hypertension (HCC)   PAF (paroxysmal atrial fibrillation) (HCC)   Chronic diastolic CHF (congestive heart failure) (HCC)   ILD (interstitial lung disease) (HCC)   Acute on chronic respiratory failure with hypoxia and hypercapnia probably secondary to COPD, mild pulmonary edema and interstitial lung disease./ mild acute on chronic diastolic CHF.  Patient at baseline is on high flow nasal cannula oxygen ranging between 6 to 8 L/min Currently she is requiring up to 9 L/min. She was started on broad-spectrum antibiotics for possible pneumonia, and Solu-Medrol for ILD flareup. Bronchodilators as needed. Also started on IV Lasix 40 mg twice daily. Strict intake and output.  Daily weights.    Type 2 DM: CBG (last 3)  Recent Labs    11/09/18 0048 11/09/18 0835 11/09/18 1147  GLUCAP 148* 207* 205*   Resume SSI.    Paroxysmal atrial fibrillation;  Rate  controlled. Continue with xarelto.    Hypertension:  Resume home meds.     DVT prophylaxis:  XARELTO.  Code Status: DNR Family Communication: discussed with daughter/ updated her on the phone.  Disposition Plan: pending clinical improvement.   Consultants:   None.   Procedures: none.   Antimicrobials: cefepime, one dose of vancomycin.   Subjective: Breathing is improving.   Objective: Vitals:   11/09/18 0253 11/09/18 0616 11/09/18 0654 11/09/18 0815  BP:  (!) 116/58    Pulse: 99 (!) 110    Resp: 20 (!) 21    Temp:  97.9 F (36.6 C)    TempSrc:  Oral    SpO2: 96% 91% 93% 93%  Weight:  87.4 kg    Height:  5\' 5"  (1.651 m)      Intake/Output Summary (Last 24 hours) at 11/09/2018 1130 Last data filed at 11/09/2018 0250 Gross per 24 hour  Intake 480 ml  Output 1500 ml  Net -1020 ml   Filed Weights   11/09/18 0616  Weight: 87.4 kg    Examination:  General exam: mild distress from sob on 9 lit of HF  [place] oxygen.  Respiratory system: scattered rales at bases, coarse breath sounds all over the chest.  Cardiovascular system: S1 & S2 heard, tachycardic,  Gastrointestinal system: Abdomen is nondistended, soft and nontender. No organomegaly or masses felt. Normal bowel sounds heard. Central nervous system: Alert and oriented.grossly non focal Extremities: trace edema.  Skin: No rashes, lesions or ulcers Psychiatry:  Mood & affect appropriate.     Data Reviewed: I have personally reviewed following  labs and imaging studies  CBC: Recent Labs  Lab 11/08/18 1708 11/09/18 0423  WBC 9.0 6.5  NEUTROABS 6.8  --   HGB 9.6* 10.2*  HCT 35.3* 36.7  MCV 81.7 79.8*  PLT 272 062   Basic Metabolic Panel: Recent Labs  Lab 11/08/18 1708 11/09/18 0423  NA 142 143  K 3.7 3.7  CL 100 98  CO2 29 35*  GLUCOSE 75 166*  BUN 11 10  CREATININE 0.98 0.89  CALCIUM 8.7* 8.7*   GFR: Estimated Creatinine Clearance: 61.5 mL/min (by C-G formula based on SCr of 0.89 mg/dL).  Liver Function Tests: Recent Labs  Lab 11/08/18 1708 11/09/18 0423  AST 21 22  ALT 17 19  ALKPHOS 51 55  BILITOT 0.5 1.0  PROT 6.0* 6.4*  ALBUMIN 3.2* 3.4*   No results for input(s): LIPASE, AMYLASE in the last 168 hours. No results for input(s): AMMONIA in the last 168 hours. Coagulation Profile: Recent Labs  Lab 11/08/18 1708  INR 1.1   Cardiac Enzymes: Recent Labs  Lab 11/08/18 1708  TROPONINI 0.03*   BNP (last 3 results) No results for input(s): PROBNP in the last 8760 hours. HbA1C: No results for input(s): HGBA1C in the last 72 hours. CBG: Recent Labs  Lab 11/09/18 0048 11/09/18 0835  GLUCAP 148* 207*   Lipid Profile: No results for input(s): CHOL, HDL, LDLCALC, TRIG, CHOLHDL, LDLDIRECT in the last 72 hours. Thyroid Function Tests: No results for input(s): TSH, T4TOTAL, FREET4, T3FREE, THYROIDAB in the last 72 hours. Anemia Panel: No results for input(s): VITAMINB12, FOLATE, FERRITIN, TIBC, IRON, RETICCTPCT in the last 72 hours. Sepsis Labs: No results for input(s): PROCALCITON, LATICACIDVEN in the last 168 hours.  Recent Results (from the past 240 hour(s))  SARS Coronavirus 2 (CEPHEID- Performed in [facility] hospital lab), Hosp Order     Status: None   Collection Time: 11/08/18  5:36 PM  Result Value Ref Range Status   SARS Coronavirus 2 NEGATIVE NEGATIVE Final    Comment: (NOTE) If result is NEGATIVE SARS-CoV-2 target nucleic acids are NOT DETECTED. The SARS-CoV-2 RNA is generally detectable in upper and lower  respiratory specimens during the acute phase of infection. The lowest  concentration of SARS-CoV-2 viral copies this assay can detect is 250  copies / mL. A negative result does not preclude SARS-CoV-2 infection  and should not be used as the sole basis for treatment or other  patient management decisions.  A negative result may occur with  improper specimen collection / handling, submission of specimen other  than nasopharyngeal swab,  presence of viral mutation(s) within the  areas targeted by this assay, and inadequate number of viral copies  (<250 copies / mL). A negative result must be combined with clinical  observations, patient history, and epidemiological information. If result is POSITIVE SARS-CoV-2 target nucleic acids are DETECTED. The SARS-CoV-2 RNA is generally detectable in upper and lower  respiratory specimens dur ing the acute phase of infection.  Positive  results are indicative of active infection with SARS-CoV-2.  Clinical  correlation with patient history and other diagnostic information is  necessary to determine patient infection status.  Positive results do  not rule out bacterial infection or co-infection with other viruses. If result is PRESUMPTIVE POSTIVE SARS-CoV-2 nucleic acids MAY BE PRESENT.   A presumptive positive result was obtained on the submitted specimen  and confirmed on repeat testing.  While 2019 novel coronavirus  (SARS-CoV-2) nucleic acids may be present in the submitted sample  additional confirmatory testing may be necessary for epidemiological  and / or clinical management purposes  to differentiate between  SARS-CoV-2 and other Sarbecovirus currently known to infect humans.  If clinically indicated additional testing with an alternate test  methodology [phone]) is advised. The SARS-CoV-2 RNA is generally  detectable in upper and lower respiratory sp ecimens during the acute  phase of infection. The expected result is Negative. Fact Sheet for Patients:  StrictlyIdeas.no Fact Sheet for Healthcare Providers: BankingDealers.co.za This test is not yet approved or cleared by the [place] FDA and has been authorized for detection and/or diagnosis of SARS-CoV-2 by FDA under an Emergency Use Authorization (EUA).  This EUA will remain in effect (meaning this test can be used) for the duration of the COVID-19 declaration under  Section 564(b)(1) of the Act, 21 U.S.C. section 360bbb-3(b)(1), unless the authorization is terminated or revoked sooner. Performed at [facility], [place] [address], [place], [place] [address]          Radiology Studies: Dg Chest Portable 1 View  Result Date: 11/08/2018 CLINICAL DATA:  [place].  Weakness. EXAM: PORTABLE CHEST 1 VIEW COMPARISON:  March 12, 2018 and November 10, 2017 FINDINGS: The cardiomediastinal silhouette is stable. No pneumothorax. Bibasilar opacities are similar since previous studies. The opacity may be a little greater in the left base in the interval. IMPRESSION: 1. Bibasilar opacities are identified. The opacity in the right base is stable and chronic. The opacity in left base is mildly more prominent the interval and could represent increased atelectasis or a developing infiltrate superimposed on a chronic left basilar process. Electronically Signed   By: Dorise Bullion III M.D   On: 11/08/2018 17:16        Scheduled Meds: . atorvastatin  40 mg Oral QHS  . benzonatate  400 mg Oral QHS  . buPROPion  300 mg Oral Daily  . DULoxetine  60 mg Oral Daily  . furosemide  40 mg Intravenous BID  . gabapentin  600 mg Oral TID  . insulin aspart  0-15 Units Subcutaneous TID WC  . insulin aspart  0-5 Units Subcutaneous QHS  . insulin glargine  64 Units Subcutaneous QAC breakfast  . ipratropium-albuterol  3 mL Nebulization Q6H  . iron polysaccharides  150 mg Oral Daily  . methylPREDNISolone (SOLU-MEDROL) injection  40 mg Intravenous Q6H  . metolazone  5 mg Oral Daily  . mometasone-formoterol  2 puff Inhalation BID  . multivitamin with minerals  1 tablet Oral Daily  . pantoprazole  40 mg Oral QHS  . polyethylene glycol  17 g Oral Daily  . polyvinyl alcohol  1 drop Both Eyes BID  . potassium chloride  30 mEq Oral BID  . rivaroxaban  20 mg Oral Q supper  . verapamil  360 mg Oral Daily   Continuous Infusions: . ceFEPime (MAXIPIME) IV 2 g (11/09/18 0207)      LOS: 1 day    Time spent: [place], MD [facility] Pager [phone] If 7PM-7AM, please contact night-coverage [email or web address] Password [email or web address] 11/09/2018, 11:30 AM

## 2018-11-09 NOTE — Progress Notes (Signed)
Late note Patient O2 sats in the 70-80s on 6L [place], notified respiratory. Patient placed on NRB and an ABG was obtained. Upon lung ausculation, coarse crackles/expiratory wheezes heard. Paged MD for extra dose of lasix. Got orders to give 40 mg of lasix.    2200- patient titrated to HFNC at 8L- tolerating well. Will continue to monitor closely.

## 2018-11-09 NOTE — Consult Note (Signed)
Consultation Note Date: 11/09/2018   Patient Name: Destiny Shelton  DOB: 28-Feb-1946  MRN: [id number]  Age / Sex: 73 y.o., female  PCP: Marton Redwood, MD Referring Physician: Hosie Poisson, MD  Reason for Consultation: Establishing goals of care  HPI/Patient Profile: 73 y.o. female  with past medical history of COPD, chronic bronchitis with psuedomonas colonization, ILD-O2 dependent at home (6L at rest and 8L with activity) a. Fib, DM, diastolic HF,  admitted on 11/08/2018 with SOB, hypoxia, and lower extremity edema- resulting from acute on chronic respiratory failure secondary to COPD and CHF exacerbation/ ILD flare. Palliative medicine consulted for [place].    Clinical Assessment and Goals of Care:  I have reviewed medical records including EPIC notes, labs and imaging, assessed the patient and then met at the bedside along with patient  to discuss diagnosis prognosis, GOC, EOL wishes, disposition and options.  I introduced Palliative Medicine as specialized medical care for people living with serious illness. It focuses on providing relief from the symptoms and stress of a serious illness. The goal is to improve quality of life for both the patient and the family.  We discussed a brief life review of the patient. She lives at home with her spouse- worked in a [employer].   As far as functional and nutritional status- she has noticed some weight loss and loss of appetite. Her functional status is poor- she has difficulty ambulating in her home- lives mostly bed to chair, uses bedside toilet or diapers. Family assists with her ADL's.   We discussed her current illness and what it means in the larger context of her on-going co-morbidities.  Natural disease trajectory and expectations at EOL were discussed.  I attempted to elicit values and goals of care important to the patient.  Spending time with her family is most  important to her.   Advanced directives, concepts specific to code status, artifical feeding and hydration, and rehospitalization were considered and discussed. Patient has DNR in place. She feels at this point she would want to continue life prolonging measures. If she were to go home and have another exacerbation she would want to come back to the hospital for life prolonging measures vs focusing on symptom management and possible end of life.   When asked about her symptoms and quality of life- she notes that what bothers her the most is when her husband goes to bed and she feels lonely. Otherwise, she feels well most of the time.   Primary Decision Maker PATIENT    SUMMARY OF RECOMMENDATIONS   -Full scope care -DNR -GOC are to stabilize and return home- will discuss possible outpatient Palliative f/u in the home with her tomorrow  Code Status/Advance Care Planning:  DNR  Primary Diagnoses: Present on Admission: . Acute on chronic respiratory failure with hypoxia and hypercapnia (HCC) . Essential hypertension . PAF (paroxysmal atrial fibrillation) ([place]) . Pulmonary hypertension ([place]) . Chronic diastolic CHF (congestive heart failure) ([place]) . ILD (interstitial lung disease) ([place])   I have reviewed the  medical record, interviewed the patient and family, and examined the patient. The following aspects are pertinent.  Past Medical History:  Diagnosis Date  . Adenomatous colon polyp   . ALLERGIC RHINITIS   . Anemia    iron deficient  . Anxiety   . Atrial tachycardia (HCC)    Mostly Sinus Tachycardia  . Back pain, chronic   . Carotid stenosis   . CHF (congestive heart failure) ([place])   . Community acquired pneumonia - Recent Admission (07/2013) 07/08/2013  . COPD (chronic obstructive pulmonary disease) ([place])    Requiring Home O2 at 4L  . Depression   . Diabetes mellitus type 2 with neurological manifestations ([place])    On Insulin  . Diabetes mellitus without complication  ([place])   . Diverticulosis of colon   . External hemorrhoids   . Fibromyalgia    "pain in arms and shoulders."  . Gastritis   . GERD (gastroesophageal reflux disease)   . Headache(784.0)    tension  . Hyperlipemia   . Hypertension   . Inappropriate sinus tachycardia   . Morbidly obese ([place])   . Nephrolithiasis   . Neuromuscular disorder ([place])    diabetic neuropathy  . Osteoarthritis   . Osteoporosis   . PAF (paroxysmal atrial fibrillation) ([place])   . 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  . Sleep apnea    no cpap machine  02 at 4l/min all the time  . Urosepsis 05/26/2012   Social History   Socioeconomic History  . Marital status: Married    Spouse name: Not on file  . Number of children: 2  . Years of education: HS  . Highest education level: Not on file  Occupational History  . Occupation: Disability  Social Needs  . Financial resource strain: Not on file  . Food insecurity:    Worry: Not on file    Inability: Not on file  . Transportation needs:    Medical: Not on file    Non-medical: Not on file  Tobacco Use  . Smoking status: Former Smoker    Packs/day: 2.00    Years: 30.00    Pack years: 60.00    Types: Cigarettes    Last attempt to quit: 06/02/1994    Years since quitting: 24.4  . Smokeless tobacco: Never Used  Substance and Sexual Activity  . Alcohol use: No  . Drug use: No  . Sexual activity: Not Currently  Lifestyle  . Physical activity:    Days per week: Not on file    Minutes per session: Not on file  . Stress: Not on file  Relationships  . Social connections:    Talks on phone: Not on file    Gets together: Not on file    Attends religious service: Not on file    Active member of club or organization: Not on file    Attends meetings of clubs or organizations: Not on file    Relationship status: Not on file  Other Topics Concern  . Not on file  Social History Narrative   She is a married mother of 2, grandmother of 55, great-grandmother of 2. She is with  her husband here today. She does not really exercise as she is on home oxygen and has baseline dyspnea.    Does not smoke. Does not drink. She quit smoking somewhere between 10-15 years ago. She is now trying to work on picking up her activity level. She is trying to I think more than likely get back into pulmonary  rehabilitation potentially.       Daily caffeine.    Family History  Problem Relation Age of Onset  . Heart disease Mother   . Stroke Mother   . Heart disease Father   . Heart disease Brother   . Stroke Brother   . Skin cancer Brother   . Colon polyps Sister   . Diabetes Sister   . Diabetes Brother   . Irritable bowel syndrome Daughter   . Colon cancer Neg Hx    Scheduled Meds: . atorvastatin  40 mg Oral QHS  . benzonatate  400 mg Oral QHS  . buPROPion  300 mg Oral Daily  . DULoxetine  60 mg Oral Daily  . furosemide  40 mg Intravenous BID  . gabapentin  600 mg Oral TID  . insulin aspart  0-15 Units Subcutaneous TID WC  . insulin aspart  0-5 Units Subcutaneous QHS  . insulin glargine  64 Units Subcutaneous QAC breakfast  . ipratropium-albuterol  3 mL Nebulization Q6H  . iron polysaccharides  150 mg Oral Daily  . methylPREDNISolone (SOLU-MEDROL) injection  40 mg Intravenous Q6H  . metolazone  5 mg Oral Daily  . mometasone-formoterol  2 puff Inhalation BID  . multivitamin with minerals  1 tablet Oral Daily  . pantoprazole  40 mg Oral QHS  . polyethylene glycol  17 g Oral Daily  . polyvinyl alcohol  1 drop Both Eyes BID  . potassium chloride  30 mEq Oral BID  . rivaroxaban  20 mg Oral Q supper  . verapamil  360 mg Oral Daily   Continuous Infusions: . ceFEPime (MAXIPIME) IV 2 g (11/09/18 1229)   PRN Meds:.acetaminophen, ALPRAZolam, guaiFENesin, HYDROcodone-acetaminophen, levalbuterol, ondansetron **OR** ondansetron (ZOFRAN) IV Medications Prior to Admission:  Prior to Admission medications   Medication Sig Start Date End Date Taking? Authorizing Provider   acetaminophen (TYLENOL) 500 MG tablet Take 1 tablet (500 mg total) by mouth every 6 (six) hours as needed for mild pain or headache. 02/01/17  Yes Arrien, Jimmy Picket, MD  albuterol (PROVENTIL) (2.5 MG/3ML) 0.083% nebulizer solution USE 1 VIAL VIA NEBULIZER EVERY 6 HOURS AS NEEDED FOR WHEEZING OR SHORTNESS OF BREATH Patient taking differently: Take 2.5 mg by nebulization every 6 (six) hours as needed for wheezing or shortness of breath.  05/03/18  Yes Juanito Doom, MD  ALPRAZolam Duanne Moron) 0.5 MG tablet Take 0.25-0.5 mg by mouth daily as needed for anxiety.  02/06/17  Yes [provider]  atorvastatin (LIPITOR) 40 MG tablet Take 40 mg by mouth at bedtime.  06/06/13  Yes [provider]  azithromycin (ZITHROMAX) 250 MG tablet TAKE 1 TABLET BY MOUTH DAILY. START AFTER Laqueta Linden Patient taking differently: Take 250 mg by mouth daily.  06/19/18  Yes Juanito Doom, MD  benzonatate (TESSALON) 200 MG capsule Take 600 mg by mouth at bedtime.    Yes [provider]  buPROPion (WELLBUTRIN XL) 300 MG 24 hr tablet Take 300 mg by mouth daily. 03/03/16  Yes [provider]  DALIRESP 500 MCG TABS tablet Take 500 mcg by mouth daily. 09/03/18  Yes [provider]  DULoxetine (CYMBALTA) 60 MG capsule Take 60 mg by mouth daily.    Yes [provider]  gabapentin (NEURONTIN) 600 MG tablet Take 600 mg by mouth 3 (three) times daily.    Yes [provider]  Guaifenesin (MUCINEX MAXIMUM STRENGTH) 1200 MG TB12 Take 1,200 mg by mouth 2 (two) times daily as needed (for cough or congestion).  Yes [provider]  HYDROcodone-acetaminophen (NORCO/VICODIN) 5-325 MG per tablet Take 1 tablet by mouth See admin instructions. EVERY 4-6 HOURS AS NEEDED FOR PAIN OR SHORTNESS OF BREATH   Yes [provider]  insulin aspart (NOVOLOG FLEXPEN) 100 UNIT/ML FlexPen Inject 5 Units into the skin 3 (three) times daily as needed for high blood sugar.    Yes [provider]  insulin glargine (LANTUS) 100 UNIT/ML injection Inject 0.66 mLs (66 Units total) into the skin daily before breakfast. Patient taking differently: Inject 64 Units into the skin daily before breakfast.  01/24/16  Yes Short, Noah Delaine, MD  iron polysaccharides (NIFEREX) 150 MG capsule Take 1 capsule (150 mg total) by mouth 2 (two) times daily. Patient taking differently: Take 150 mg by mouth daily.  01/24/16  Yes Short, Noah Delaine, MD  levalbuterol [facility] HFA) 45 MCG/ACT inhaler Inhale 1-2 puffs into the lungs every 4 (four) hours as needed for wheezing. Patient taking differently: Inhale 2 puffs into the lungs every 4 (four) hours as needed for wheezing or shortness of breath.  06/01/12  Yes Marton Redwood, MD  Multiple Vitamin (MULTIVITAMIN WITH MINERALS) TABS tablet Take 1 tablet by mouth daily.   Yes [provider]  omeprazole (PRILOSEC OTC) 20 MG tablet Take 20 mg by mouth daily.    Yes [provider]  OXYGEN Inhale 5-8 L into the lungs See admin instructions. 5 liters when at rest and 8 liters when ambulatory   Yes [provider]  polyethylene glycol (MIRALAX / GLYCOLAX) packet Take 17 g by mouth daily. Mix in 8 oz liquid and drink   Yes [provider]  polyvinyl alcohol (ARTIFICIAL TEARS) 1.4 % ophthalmic solution Place 1 drop into both eyes 2 (two) times daily.    Yes [provider]  potassium chloride (K-DUR) 10 MEQ tablet TAKE 3 TABLETS BY MOUTH TWICE DAILY. Patient taking differently: Take 30 mEq by mouth daily.  10/04/18  Yes Larey Dresser, MD  predniSONE (DELTASONE) 5 MG tablet TAKE 1 TABLET(5 MG) BY MOUTH DAILY WITH BREAKFAST Patient taking differently: Take 5 mg by mouth daily.  08/09/18  Yes McQuaid, Ronie Spies, MD  SPIRIVA HANDIHALER 18 MCG inhalation capsule INHALE THE CONTENTS OF 1 CAPSULE VIA HANDIHALER BY MOUTH EVERY MORNING Patient taking differently: Place 18 mcg into inhaler and inhale daily.  08/05/18   Yes Juanito Doom, MD  SYMBICORT 160-4.5 MCG/ACT inhaler INHALE 2 PUFFS BY MOUTH TWICE DAILY Patient taking differently: Inhale 2 puffs into the lungs 2 (two) times a day.  07/16/18  Yes McQuaid, Ronie Spies, MD  tobramycin, PF, (TOBI) 300 MG/5ML nebulizer solution Take 5 mLs (300 mg total) by nebulization 2 (two) times daily. Every other month DX: J44.9 01/20/18  Yes Juanito Doom, MD  tolterodine (DETROL LA) 4 MG 24 hr capsule Take 4 mg by mouth daily as needed. Overactive bladder 08/06/18  Yes [provider]  torsemide (DEMADEX) 20 MG tablet Take 80 mg (4 tabs) in am and 40 mg (2 tabs) in pm Patient taking differently: 40-80 mg See admin instructions. Take 80 mg (4 tabs) in am and 40 mg (2 tabs) in pm 01/11/18  Yes Larey Dresser, MD  verapamil (VERELAN PM) 360 MG 24 hr capsule TAKE 1 CAPSULE(360 MG) BY MOUTH DAILY Patient taking differently: Take 360 mg by mouth daily.  09/02/18  Yes Larey Dresser, MD  XARELTO 20 MG TABS tablet Take 20 mg by mouth daily with supper. 02/22/17  Yes  [provider]   Allergies  Allergen Reactions  . Aspirin Shortness Of Breath  . Nsaids Shortness Of Breath   Review of Systems  Constitutional: Positive for activity change and appetite change.  Respiratory: Positive for shortness of breath.   Cardiovascular: Positive for leg swelling. Negative for chest pain.    Physical Exam Vitals signs and nursing note reviewed.  Pulmonary:     Effort: Pulmonary effort is normal.     Comments: On HFNC Neurological:     Mental Status: She is alert and oriented to person, place, and time.     Vital Signs: BP 113/69 (BP Location: Left Arm)   Pulse (!) 111   Temp 98.2 F (36.8 C) (Oral)   Resp (!) 22   Ht 5' 5" (1.651 m)   Wt 87.4 kg   SpO2 93%   BMI 32.06 kg/m  Pain Scale: 0-10   Pain Score: 0-No pain   SpO2: SpO2: 93 % O2 Device:SpO2: 93 % O2 Flow Rate: .O2 Flow Rate (L/min): 9 L/min  IO: Intake/output summary:    Intake/Output Summary (Last 24 hours) at 11/09/2018 1650 Last data filed at 11/09/2018 1500 Gross per 24 hour  Intake 1093.07 ml  Output 3300 ml  Net -2206.93 ml    LBM: Last BM Date: 11/07/18 Baseline Weight: Weight: 87.4 kg Most recent weight: Weight: 87.4 kg     Palliative Assessment/Data: PPS: 40%     Thank you for this consult. Palliative medicine will continue to follow and assist as needed.   Time In: 1530 Time Out: 1700 Time Total: 90 minutes Greater than 50%  of this time was spent counseling and coordinating care related to the above assessment and plan.  Signed by: Mariana Kaufman, AGNP-C Palliative Medicine    Please contact Palliative Medicine Team phone at [phone] for questions and concerns.  For individual provider: See Shea Evans

## 2018-11-09 NOTE — Progress Notes (Signed)
Reviewed, agree 

## 2018-11-10 DIAGNOSIS — I272 Pulmonary hypertension, unspecified: Secondary | ICD-10-CM

## 2018-11-10 LAB — URINALYSIS, ROUTINE W REFLEX MICROSCOPIC
Bilirubin Urine: NEGATIVE
Glucose, UA: NEGATIVE mg/dL
Hgb urine dipstick: NEGATIVE
Ketones, ur: NEGATIVE mg/dL
Leukocytes,Ua: NEGATIVE
Nitrite: NEGATIVE
Protein, ur: NEGATIVE mg/dL
Specific Gravity, Urine: 1.01 (ref 1.005–1.030)
pH: 6 (ref 5.0–8.0)

## 2018-11-10 LAB — BASIC METABOLIC PANEL
Anion gap: 14 (ref 5–15)
BUN: 27 mg/dL — ABNORMAL HIGH (ref 8–23)
CO2: 39 mmol/L — ABNORMAL HIGH (ref 22–32)
Calcium: 9.7 mg/dL (ref 8.9–10.3)
Chloride: 89 mmol/L — ABNORMAL LOW (ref 98–111)
Creatinine, Ser: 1.27 mg/dL — ABNORMAL HIGH (ref 0.44–1.00)
GFR calc Af Amer: 48 mL/min — ABNORMAL LOW (ref >60)
GFR calc non Af Amer: 42 mL/min — ABNORMAL LOW (ref >60)
Glucose, Bld: 219 mg/dL — ABNORMAL HIGH (ref 70–99)
Potassium: 3.4 mmol/L — ABNORMAL LOW (ref 3.5–5.1)
Sodium: 142 mmol/L (ref 135–145)

## 2018-11-10 LAB — GLUCOSE, CAPILLARY
Glucose-Capillary: 212 mg/dL — ABNORMAL HIGH (ref 70–99)
Glucose-Capillary: 228 mg/dL — ABNORMAL HIGH (ref 70–99)
Glucose-Capillary: 236 mg/dL — ABNORMAL HIGH (ref 70–99)
Glucose-Capillary: 164 mg/dL — ABNORMAL HIGH (ref 70–99)

## 2018-11-10 LAB — PROCALCITONIN: Procalcitonin: <0.1 ng/mL

## 2018-11-10 MED ORDER — METHYLPREDNISOLONE SODIUM SUCC 40 MG IJ SOLR
40.0000 mg | Freq: Every day | INTRAMUSCULAR | Status: DC
  Administered 2018-11-11 – 2018-11-12 (×2): 40 mg via INTRAVENOUS
  Filled 2018-11-10 (×2): qty 1

## 2018-11-10 NOTE — Evaluation (Signed)
Physical Therapy Evaluation Patient Details Name: Destiny Shelton MRN: [id number] DOB: 1945/12/13 Today's Date: 11/10/2018   History of Present Illness  KYLI SORTER is a 73 y.o. female with medical history significant of COPD, chronic Pseudomonas colonization, interstitial lung disease, recurrent community-acquired pneumonia, diabetes, anxiety disorder, depression, GERD, paroxysmal atrial fibrillation who came to the ER with progressive shortness of breath and cough. Work up includes CHF.  Pt currently needing as much as 8 to 10L HFNC.  Clinical Impression  Pt admitted with/for SOB and cough with CHF.  Pt presently needing min assist for safe mobility, and is mostly limited by low SpO2 with mobility.  Pt currently limited functionally due to the problems listed below.  (see problems list.)  Pt will benefit from PT to maximize function and safety to be able to get home safely with available assist.     Follow Up Recommendations Home health PT;Supervision/Assistance - 24 hour    Equipment Recommendations  Other (comment)(TBA)    Recommendations for Other Services       Precautions / Restrictions Precautions Precautions: Fall      Mobility  Bed Mobility Overal bed mobility: Needs Assistance Bed Mobility: Supine to Sit     Supine to sit: Min guard     General bed mobility comments: no assist needed, but used rail heavily  Transfers Overall transfer level: Needs assistance   Transfers: Sit to/from Stand;Stand Pivot Transfers Sit to Stand: Min assist Stand pivot transfers: Min assist       General transfer comment: pt attempted to ambulate first with IV pole, then RW after IV pole not enough assist,  but both LE were "shakey at the knees" and she deferred in lieu of transfer to the chair.  Ambulation/Gait             General Gait Details: transfer only with RW over a 4 foot span.  Stairs            Wheelchair Mobility    Modified Rankin (Stroke Patients  Only)       Balance Overall balance assessment: Needs assistance Sitting-balance support: No upper extremity supported Sitting balance-Leahy Scale: Fair       Standing balance-Leahy Scale: Poor                               Pertinent Vitals/Pain Pain Assessment: Faces Faces Pain Scale: No hurt    Home Living Family/patient expects to be discharged to:: Private residence Living Arrangements: Spouse/significant other Available Help at Discharge: Family;Available 24 hours/day Type of Home: House Home Access: Stairs to enter Entrance Stairs-Rails: None Entrance Stairs-Number of Steps: 1 Home Layout: One level Home Equipment: Walker - 2 wheels;Cane - single point      Prior Function Level of Independence: Independent with assistive device(s)         Comments: limited in getting into the community by portable O2 needs     Hand Dominance        Extremity/Trunk Assessment   Upper Extremity Assessment Upper Extremity Assessment: Defer to OT evaluation    Lower Extremity Assessment Lower Extremity Assessment: Generalized weakness       Communication   Communication: No difficulties  Cognition Arousal/Alertness: Awake/alert Behavior During Therapy: WFL for tasks assessed/performed Overall Cognitive Status: Within Functional Limits for tasks assessed  General Comments General comments (skin integrity, edema, etc.): Once pt started to mobilize in the bed and transfer to recliner, sats started a drop to a low of 75% on 8L. 10L did not result in a quick turnaround, BUT finally on 10 L HFNC, sats rose to 90%  EHR max was sustained around 123 bpm until at rest in the 100's    Exercises     Assessment/Plan    PT Assessment Patient needs continued PT services  PT Problem List Decreased strength;Decreased activity tolerance;Decreased mobility       PT Treatment Interventions Stair  training;Functional mobility training;Therapeutic activities;Balance training;Patient/family education;Gait training;DME instruction    PT Goals (Current goals can be found in the Care Plan section)  Acute Rehab PT Goals Patient Stated Goal: Go home as soon as possible PT Goal Formulation: With patient Time For Goal Achievement: 11/24/18 Potential to Achieve Goals: Good    Frequency Min 3X/week   Barriers to discharge        Co-evaluation               AM-PAC PT "6 Clicks" Mobility  Outcome Measure Help needed turning from your back to your side while in a flat bed without using bedrails?: A Little Help needed moving from lying on your back to sitting on the side of a flat bed without using bedrails?: A Little Help needed moving to and from a bed to a chair (including a wheelchair)?: A Little Help needed standing up from a chair using your arms (e.g., wheelchair or bedside chair)?: A Little Help needed to walk in hospital room?: A Little Help needed climbing 3-5 steps with a railing? : A Lot 6 Click Score: 17    End of Session   Activity Tolerance: Patient limited by fatigue Patient left: in chair;with call bell/phone within reach;with chair alarm set   PT Visit Diagnosis: Unsteadiness on feet (R26.81);Other abnormalities of gait and mobility (R26.89)    Time: 7341-9379 PT Time Calculation (min) (ACUTE ONLY): 28 min   Charges:   PT Evaluation $PT Eval Moderate Complexity: 1 Mod PT Treatments $Therapeutic Activity: 8-22 mins        11/10/2018  Donnella Sham, PT Acute Rehabilitation Services [phone]  (pager) [phone]  (office)  Tessie Fass Cori Justus 11/10/2018, 5:57 PM

## 2018-11-10 NOTE — Progress Notes (Signed)
PROGRESS NOTE    Destiny Shelton  TMH:[id number] DOB: [birth year] DOA: 11/08/2018 PCP: Marton Redwood, MD   Brief Narrative: Destiny Shelton is a 73 y.o. femalewith medical history significant ofCOPD, chronic Pseudomonas colonization, interstitial lung disease, recurrent community-acquired pneumonia, diabetes, anxiety disorder, depression, GERD, paroxysmal atrial fibrillation. Patient presented secondary to worsening dyspnea with concern for acute heart failure.   Assessment & Plan:   Principal Problem:   Acute on chronic respiratory failure with hypoxia and hypercapnia (HCC) Active Problems:   Diabetes mellitus, type II, insulin dependent (HCC)   Essential hypertension   Pulmonary hypertension (HCC)   PAF (paroxysmal atrial fibrillation) (HCC)   Chronic diastolic CHF (congestive heart failure) (HCC)   ILD (interstitial lung disease) (HCC)   Goals of care, counseling/discussion   Palliative care by specialist   Advanced care planning/counseling discussion   Acute on chronic respiratory failure with hypoxia and hypercapnia Likely secondary to acute heart failure. Started on treatment for heart failure, pneumonia and ILD flare. Oxygen requirements remains stable at 10L. Patient uses 8 L chronically at home. Palliative care consulted and goals include continued scope of care. -Wean to home oxygen as able -PT eval -Treat below problems -Procalcitonin; will discontinue antibiotics if undetectable  Acute on chronic diastolic heart failure Last EF of 55% in August 2019. -Continue Lasix and metolazone -BMP today; adjust lasix depending on renal function  Interstitial lung disease Concern for possible flare. Started on IV steroids -Wean to daily Solu-medrol  Paroxysmal atrial fibrillation Rate controlled. -Continue Xarelto  Diabetes mellitus, type 2 Mildly controlled. Likely worsened secondary to solumedrol.   Hyperlipidemia -Continue Lipitor  DVT prophylaxis: Xarelto Code  Status:   Code Status: DNR Family Communication: None Disposition Plan: Discharge home likely in 1-2 days if able to wean oxygen to baseline   Consultants:   Palliative care medicine  Procedures:   None  Antimicrobials:  Vancomycin (6/8)  Cefepime (6/8>>    Subjective: Shortness of breath is improved.  Objective: Vitals:   11/10/18 0138 11/10/18 0606 11/10/18 0751 11/10/18 0756  BP:  123/66    Pulse: 99 (!) 106    Resp: (!) 21 19    Temp:  97.7 F (36.5 C)    TempSrc:  Oral    SpO2: 96% 96% 94% 92%  Weight:  84.5 kg    Height:        Intake/Output Summary (Last 24 hours) at 11/10/2018 0928 Last data filed at 11/10/2018 0911 Gross per 24 hour  Intake 1433.07 ml  Output 3200 ml  Net -1766.93 ml   Filed Weights   11/09/18 0616 11/10/18 0606  Weight: 87.4 kg 84.5 kg    Examination:  General exam: Appears calm and comfortable Respiratory system: Mild rales, diminished breath sounds. Respiratory effort normal. Cardiovascular system: S1 & S2 heard, RRR. No murmurs, rubs, gallops or clicks. Gastrointestinal system: Abdomen is nondistended, soft and nontender. No organomegaly or masses felt. Normal bowel sounds heard. Central nervous system: Alert and oriented. No focal neurological deficits. Extremities: No edema. No calf tenderness Skin: No cyanosis. No rashes Psychiatry: Judgement and insight appear normal. Mood & affect appropriate.     Data Reviewed: I have personally reviewed following labs and imaging studies  CBC: Recent Labs  Lab 11/08/18 1708 11/09/18 0423  WBC 9.0 6.5  NEUTROABS 6.8  --   HGB 9.6* 10.2*  HCT 35.3* 36.7  MCV 81.7 79.8*  PLT 272 921   Basic Metabolic Panel: Recent Labs  Lab 11/08/18 1708 11/09/18  0423  NA 142 143  K 3.7 3.7  CL 100 98  CO2 29 35*  GLUCOSE 75 166*  BUN 11 10  CREATININE 0.98 0.89  CALCIUM 8.7* 8.7*   GFR: Estimated Creatinine Clearance: 60.4 mL/min (by C-G formula based on SCr of 0.89 mg/dL).  Liver Function Tests: Recent Labs  Lab 11/08/18 1708 11/09/18 0423  AST 21 22  ALT 17 19  ALKPHOS 51 55  BILITOT 0.5 1.0  PROT 6.0* 6.4*  ALBUMIN 3.2* 3.4*   No results for input(s): LIPASE, AMYLASE in the last 168 hours. No results for input(s): AMMONIA in the last 168 hours. Coagulation Profile: Recent Labs  Lab 11/08/18 1708  INR 1.1   Cardiac Enzymes: Recent Labs  Lab 11/08/18 1708  TROPONINI 0.03*   BNP (last 3 results) No results for input(s): PROBNP in the last 8760 hours. HbA1C: No results for input(s): HGBA1C in the last 72 hours. CBG: Recent Labs  Lab 11/09/18 0835 11/09/18 1147 11/09/18 1702 11/09/18 2210 11/10/18 0748  GLUCAP 207* 205* 160* 231* 212*   Lipid Profile: No results for input(s): CHOL, HDL, LDLCALC, TRIG, CHOLHDL, LDLDIRECT in the last 72 hours. Thyroid Function Tests: No results for input(s): TSH, T4TOTAL, FREET4, T3FREE, THYROIDAB in the last 72 hours. Anemia Panel: No results for input(s): VITAMINB12, FOLATE, FERRITIN, TIBC, IRON, RETICCTPCT in the last 72 hours. Sepsis Labs: No results for input(s): PROCALCITON, LATICACIDVEN in the last 168 hours.  Recent Results (from the past 240 hour(s))  SARS Coronavirus 2 (CEPHEID- Performed in [facility] lab), Hosp Order     Status: None   Collection Time: 11/08/18  5:36 PM  Result Value Ref Range Status   SARS Coronavirus 2 NEGATIVE NEGATIVE Final    Comment: (NOTE) If result is NEGATIVE SARS-CoV-2 target nucleic acids are NOT DETECTED. The SARS-CoV-2 RNA is generally detectable in upper and lower  respiratory specimens during the acute phase of infection. The lowest  concentration of SARS-CoV-2 viral copies this assay can detect is 250  copies / mL. A negative result does not preclude SARS-CoV-2 infection  and should not be used as the sole basis for treatment or other  patient management decisions.  A negative result may occur with  improper specimen collection / handling,  submission of specimen other  than nasopharyngeal swab, presence of viral mutation(s) within the  areas targeted by this assay, and inadequate number of viral copies  (<250 copies / mL). A negative result must be combined with clinical  observations, patient history, and epidemiological information. If result is POSITIVE SARS-CoV-2 target nucleic acids are DETECTED. The SARS-CoV-2 RNA is generally detectable in upper and lower  respiratory specimens dur ing the acute phase of infection.  Positive  results are indicative of active infection with SARS-CoV-2.  Clinical  correlation with patient history and other diagnostic information is  necessary to determine patient infection status.  Positive results do  not rule out bacterial infection or co-infection with other viruses. If result is PRESUMPTIVE POSTIVE SARS-CoV-2 nucleic acids MAY BE PRESENT.   A presumptive positive result was obtained on the submitted specimen  and confirmed on repeat testing.  While 2019 novel coronavirus  (SARS-CoV-2) nucleic acids may be present in the submitted sample  additional confirmatory testing may be necessary for epidemiological  and / or clinical management purposes  to differentiate between  SARS-CoV-2 and other Sarbecovirus currently known to infect humans.  If clinically indicated additional testing with an alternate test  methodology [phone]) is  advised. The SARS-CoV-2 RNA is generally  detectable in upper and lower respiratory sp ecimens during the acute  phase of infection. The expected result is Negative. Fact Sheet for Patients:  StrictlyIdeas.no Fact Sheet for Healthcare Providers: BankingDealers.co.za This test is not yet approved or cleared by the [place] FDA and has been authorized for detection and/or diagnosis of SARS-CoV-2 by FDA under an Emergency Use Authorization (EUA).  This EUA will remain in effect (meaning this test can be  used) for the duration of the COVID-19 declaration under Section 564(b)(1) of the Act, 21 U.S.C. section 360bbb-3(b)(1), unless the authorization is terminated or revoked sooner. Performed at [facility], [place] [address] [place], [place] [address]   Blood culture (routine x 2)     Status: None (Preliminary result)   Collection Time: 11/08/18  6:25 PM  Result Value Ref Range Status   Specimen Description BLOOD BLOOD RIGHT FOREARM  Final   Special Requests   Final    BOTTLES DRAWN AEROBIC AND ANAEROBIC Blood Culture results may not be optimal due to an inadequate volume of blood received in culture bottles   Culture   Final    NO GROWTH < 24 HOURS Performed at [facility], [place] [address], [place], [place] [address]    Report Status PENDING  Incomplete  Blood culture (routine x 2)     Status: None (Preliminary result)   Collection Time: 11/08/18  6:40 PM  Result Value Ref Range Status   Specimen Description BLOOD LEFT ANTECUBITAL  Final   Special Requests   Final    BOTTLES DRAWN AEROBIC AND ANAEROBIC Blood Culture adequate volume   Culture   Final    NO GROWTH < 24 HOURS Performed at [facility], [facility] [place] [address], [place], [place] [address]    Report Status PENDING  Incomplete         Radiology Studies: Dg Chest Portable 1 View  Result Date: 11/08/2018 CLINICAL DATA:  Rhonchi.  Weakness. EXAM: PORTABLE CHEST 1 VIEW COMPARISON:  March 12, 2018 and November 10, 2017 FINDINGS: The cardiomediastinal silhouette is stable. No pneumothorax. Bibasilar opacities are similar since previous studies. The opacity may be a little greater in the left base in the interval. IMPRESSION: 1. Bibasilar opacities are identified. The opacity in the right base is stable and chronic. The opacity in left base is mildly more prominent the interval and could represent increased atelectasis or a developing infiltrate superimposed on a chronic left basilar process. Electronically Signed   By:  Dorise Bullion III M.D   On: 11/08/2018 17:16        Scheduled Meds: . atorvastatin  40 mg Oral QHS  . benzonatate  400 mg Oral QHS  . buPROPion  300 mg Oral Daily  . DULoxetine  60 mg Oral Daily  . furosemide  40 mg Intravenous BID  . gabapentin  600 mg Oral TID  . insulin aspart  0-15 Units Subcutaneous TID WC  . insulin aspart  0-5 Units Subcutaneous QHS  . insulin glargine  64 Units Subcutaneous QAC breakfast  . ipratropium-albuterol  3 mL Nebulization Q6H  . iron polysaccharides  150 mg Oral Daily  . methylPREDNISolone (SOLU-MEDROL) injection  40 mg Intravenous Q6H  . metolazone  5 mg Oral Daily  . mometasone-formoterol  2 puff Inhalation BID  . multivitamin with minerals  1 tablet Oral Daily  . pantoprazole  40 mg Oral QHS  . polyethylene glycol  17 g  Oral Daily  . polyvinyl alcohol  1 drop Both Eyes BID  . potassium chloride  30 mEq Oral BID  . rivaroxaban  20 mg Oral Q supper  . verapamil  360 mg Oral Daily   Continuous Infusions: . ceFEPime (MAXIPIME) IV 2 g (11/10/18 0523)     LOS: 2 days     Cordelia Poche, MD [facility] 11/10/2018, 9:28 AM  If 7PM-7AM, please contact night-coverage [email or web address]

## 2018-11-11 ENCOUNTER — Inpatient Hospital Stay (HOSPITAL_COMMUNITY): Payer: Medicare Other

## 2018-11-11 LAB — BASIC METABOLIC PANEL
Anion gap: 16 — ABNORMAL HIGH (ref 5–15)
Anion gap: 16 — ABNORMAL HIGH (ref 5–15)
BUN: 37 mg/dL — ABNORMAL HIGH (ref 8–23)
BUN: 38 mg/dL — ABNORMAL HIGH (ref 8–23)
CO2: 39 mmol/L — ABNORMAL HIGH (ref 22–32)
CO2: 37 mmol/L — ABNORMAL HIGH (ref 22–32)
Calcium: 10.2 mg/dL (ref 8.9–10.3)
Calcium: 10.2 mg/dL (ref 8.9–10.3)
Chloride: 84 mmol/L — ABNORMAL LOW (ref 98–111)
Chloride: 85 mmol/L — ABNORMAL LOW (ref 98–111)
Creatinine, Ser: 1.45 mg/dL — ABNORMAL HIGH (ref 0.44–1.00)
Creatinine, Ser: 1.35 mg/dL — ABNORMAL HIGH (ref 0.44–1.00)
GFR calc Af Amer: 41 mL/min — ABNORMAL LOW (ref >60)
GFR calc Af Amer: 45 mL/min — ABNORMAL LOW (ref >60)
GFR calc non Af Amer: 36 mL/min — ABNORMAL LOW (ref >60)
GFR calc non Af Amer: 39 mL/min — ABNORMAL LOW (ref >60)
Glucose, Bld: 119 mg/dL — ABNORMAL HIGH (ref 70–99)
Glucose, Bld: 160 mg/dL — ABNORMAL HIGH (ref 70–99)
Potassium: 2.8 mmol/L — ABNORMAL LOW (ref 3.5–5.1)
Potassium: 3.6 mmol/L (ref 3.5–5.1)
Sodium: 139 mmol/L (ref 135–145)
Sodium: 138 mmol/L (ref 135–145)

## 2018-11-11 LAB — GLUCOSE, CAPILLARY
Glucose-Capillary: 122 mg/dL — ABNORMAL HIGH (ref 70–99)
Glucose-Capillary: 178 mg/dL — ABNORMAL HIGH (ref 70–99)
Glucose-Capillary: 161 mg/dL — ABNORMAL HIGH (ref 70–99)
Glucose-Capillary: 156 mg/dL — ABNORMAL HIGH (ref 70–99)

## 2018-11-11 MED ORDER — RIVAROXABAN 15 MG PO TABS
15.0000 mg | ORAL_TABLET | Freq: Every day | ORAL | Status: DC
  Administered 2018-11-11: 15 mg via ORAL
  Filled 2018-11-11: qty 1

## 2018-11-11 NOTE — Progress Notes (Signed)
PROGRESS NOTE    Destiny Shelton  SEG:[id number] DOB: 1945/12/07 DOA: 11/08/2018 PCP: Marton Redwood, MD   Brief Narrative: Destiny Shelton is a 73 y.o. femalewith medical history significant ofCOPD, chronic Pseudomonas colonization, interstitial lung disease, recurrent community-acquired pneumonia, diabetes, anxiety disorder, depression, GERD, paroxysmal atrial fibrillation. Patient presented secondary to worsening dyspnea with concern for acute heart failure.   Assessment & Plan:   Principal Problem:   Acute on chronic respiratory failure with hypoxia and hypercapnia (HCC) Active Problems:   Diabetes mellitus, type II, insulin dependent (HCC)   Essential hypertension   Pulmonary hypertension (HCC)   PAF (paroxysmal atrial fibrillation) (HCC)   Chronic diastolic CHF (congestive heart failure) (HCC)   ILD (interstitial lung disease) (HCC)   Goals of care, counseling/discussion   Palliative care by specialist   Advanced care planning/counseling discussion   Acute on chronic respiratory failure with hypoxia and hypercapnia Likely secondary to acute heart failure. Started on treatment for heart failure, pneumonia and ILD flare. Oxygen requirements remains stable at 10L. Patient uses 8 L chronically at home. Palliative care consulted and goals include continued scope of care. Procalcitonin undetectable. -Wean to home oxygen as able -PT eval -Treat below problems  Acute on chronic diastolic heart failure Last EF of 55% in August 2019. Creatinine bumping. -Stop Lasix and metolazone secondary to worsening kidney funciton -BMP this afternoon and tomorrow morning  Interstitial lung disease Concern for possible flare. Started on IV steroids -Continue Solu-medrol; switch to prednisone tomorrow and start taper down to home dose of 5 mg daily  Paroxysmal atrial fibrillation Rate controlled. -Continue Xarelto (decrease dose to 15 mg daily per pharmacy recommendations secondary to  creatinine clearance); if creatinine improves prior to discharge, will discharge on home dose  Diabetes mellitus, type 2 Mildly controlled. Likely worsened secondary to solumedrol.   Hyperlipidemia -Continue Lipitor  DVT prophylaxis: Xarelto Code Status:   Code Status: DNR Family Communication: None Disposition Plan: Discharge home likely in 1 day   Consultants:   Palliative care medicine  Procedures:   None  Antimicrobials:  Vancomycin (6/8)  Cefepime (6/8>>    Subjective: No shortness of breath at rest. Some dyspnea with ambulation but closer to baseline.  Objective: Vitals:   11/11/18 0451 11/11/18 0901 11/11/18 0909 11/11/18 1000  BP: 101/68     Pulse: (!) 132   (!) 111  Resp:    20  Temp: 97.8 F (36.6 C)     TempSrc: Oral     SpO2: 93% 97% 92% 93%  Weight: 85.5 kg     Height:        Intake/Output Summary (Last 24 hours) at 11/11/2018 1142 Last data filed at 11/11/2018 1000 Gross per 24 hour  Intake 720 ml  Output 1300 ml  Net -580 ml   Filed Weights   11/09/18 0616 11/10/18 0606 11/11/18 0451  Weight: 87.4 kg 84.5 kg 85.5 kg    Examination:  General exam: Appears calm and comfortable Respiratory system: Diminished bilaterally with mild wheezing and mild rales. Respiratory effort normal. Cardiovascular system: S1 & S2 heard, RRR. No murmurs, rubs, gallops or clicks. Gastrointestinal system: Abdomen is nondistended, soft and nontender. No organomegaly or masses felt. Normal bowel sounds heard. Central nervous system: Alert and oriented. No focal neurological deficits. Extremities: No edema. No calf tenderness Skin: No cyanosis. No rashes Psychiatry: Judgement and insight appear normal. Mood & affect appropriate.     Data Reviewed: I have personally reviewed following labs and imaging studies  CBC: Recent Labs  Lab 11/08/18 1708 11/09/18 0423  WBC 9.0 6.5  NEUTROABS 6.8  --   HGB 9.6* 10.2*  HCT 35.3* 36.7  MCV 81.7 79.8*  PLT 272  803   Basic Metabolic Panel: Recent Labs  Lab 11/08/18 1708 11/09/18 0423 11/10/18 0825 11/11/18 0416  NA 142 143 142 139  K 3.7 3.7 3.4* 2.8*  CL 100 98 89* 84*  CO2 29 35* 39* 39*  GLUCOSE 75 166* 219* 119*  BUN 11 10 27* 37*  CREATININE 0.98 0.89 1.27* 1.45*  CALCIUM 8.7* 8.7* 9.7 10.2   GFR: Estimated Creatinine Clearance: 37.3 mL/min (A) (by C-G formula based on SCr of 1.45 mg/dL (H)). Liver Function Tests: Recent Labs  Lab 11/08/18 1708 11/09/18 0423  AST 21 22  ALT 17 19  ALKPHOS 51 55  BILITOT 0.5 1.0  PROT 6.0* 6.4*  ALBUMIN 3.2* 3.4*   No results for input(s): LIPASE, AMYLASE in the last 168 hours. No results for input(s): AMMONIA in the last 168 hours. Coagulation Profile: Recent Labs  Lab 11/08/18 1708  INR 1.1   Cardiac Enzymes: Recent Labs  Lab 11/08/18 1708  TROPONINI 0.03*   BNP (last 3 results) No results for input(s): PROBNP in the last 8760 hours. HbA1C: No results for input(s): HGBA1C in the last 72 hours. CBG: Recent Labs  Lab 11/10/18 1052 11/10/18 1612 11/10/18 2140 11/11/18 0801 11/11/18 1133  GLUCAP 228* 236* 164* 122* 178*   Lipid Profile: No results for input(s): CHOL, HDL, LDLCALC, TRIG, CHOLHDL, LDLDIRECT in the last 72 hours. Thyroid Function Tests: No results for input(s): TSH, T4TOTAL, FREET4, T3FREE, THYROIDAB in the last 72 hours. Anemia Panel: No results for input(s): VITAMINB12, FOLATE, FERRITIN, TIBC, IRON, RETICCTPCT in the last 72 hours. Sepsis Labs: Recent Labs  Lab 11/10/18 0825  PROCALCITON <0.10    Recent Results (from the past 240 hour(s))  SARS Coronavirus 2 (CEPHEID- Performed in [facility] hospital lab), Hosp Order     Status: None   Collection Time: 11/08/18  5:36 PM   Specimen: Nasopharyngeal Swab  Result Value Ref Range Status   SARS Coronavirus 2 NEGATIVE NEGATIVE Final    Comment: (NOTE) If result is NEGATIVE SARS-CoV-2 target nucleic acids are NOT DETECTED. The SARS-CoV-2 RNA is  generally detectable in upper and lower  respiratory specimens during the acute phase of infection. The lowest  concentration of SARS-CoV-2 viral copies this assay can detect is 250  copies / mL. A negative result does not preclude SARS-CoV-2 infection  and should not be used as the sole basis for treatment or other  patient management decisions.  A negative result may occur with  improper specimen collection / handling, submission of specimen other  than nasopharyngeal swab, presence of viral mutation(s) within the  areas targeted by this assay, and inadequate number of viral copies  (<250 copies / mL). A negative result must be combined with clinical  observations, patient history, and epidemiological information. If result is POSITIVE SARS-CoV-2 target nucleic acids are DETECTED. The SARS-CoV-2 RNA is generally detectable in upper and lower  respiratory specimens dur ing the acute phase of infection.  Positive  results are indicative of active infection with SARS-CoV-2.  Clinical  correlation with patient history and other diagnostic information is  necessary to determine patient infection status.  Positive results do  not rule out bacterial infection or co-infection with other viruses. If result is PRESUMPTIVE POSTIVE SARS-CoV-2 nucleic acids MAY BE PRESENT.   A presumptive  positive result was obtained on the submitted specimen  and confirmed on repeat testing.  While 2019 novel coronavirus  (SARS-CoV-2) nucleic acids may be present in the submitted sample  additional confirmatory testing may be necessary for epidemiological  and / or clinical management purposes  to differentiate between  SARS-CoV-2 and other Sarbecovirus currently known to infect humans.  If clinically indicated additional testing with an alternate test  methodology [phone]) is advised. The SARS-CoV-2 RNA is generally  detectable in upper and lower respiratory sp ecimens during the acute  phase of infection.  The expected result is Negative. Fact Sheet for Patients:  StrictlyIdeas.no Fact Sheet for Healthcare Providers: BankingDealers.co.za This test is not yet approved or cleared by the [place] FDA and has been authorized for detection and/or diagnosis of SARS-CoV-2 by FDA under an Emergency Use Authorization (EUA).  This EUA will remain in effect (meaning this test can be used) for the duration of the COVID-19 declaration under Section 564(b)(1) of the Act, 21 U.S.C. section 360bbb-3(b)(1), unless the authorization is terminated or revoked sooner. Performed at [facility], [place] [address], [place], [place] [address]   Blood culture (routine x 2)     Status: None (Preliminary result)   Collection Time: 11/08/18  6:25 PM   Specimen: BLOOD  Result Value Ref Range Status   Specimen Description BLOOD BLOOD RIGHT FOREARM  Final   Special Requests   Final    BOTTLES DRAWN AEROBIC AND ANAEROBIC Blood Culture results may not be optimal due to an inadequate volume of blood received in culture bottles   Culture   Final    NO GROWTH 3 DAYS Performed at [facility], [place] [address] [place], [place] [address]    Report Status PENDING  Incomplete  Blood culture (routine x 2)     Status: None (Preliminary result)   Collection Time: 11/08/18  6:40 PM   Specimen: BLOOD  Result Value Ref Range Status   Specimen Description BLOOD LEFT ANTECUBITAL  Final   Special Requests   Final    BOTTLES DRAWN AEROBIC AND ANAEROBIC Blood Culture adequate volume   Culture   Final    NO GROWTH 3 DAYS Performed at [facility], [place] [address], [place], [place] [address]    Report Status PENDING  Incomplete         Radiology Studies: Dg Chest Port 1 View  Result Date: 11/11/2018 CLINICAL DATA:  Acute respiratory failure EXAM: PORTABLE CHEST 1 VIEW COMPARISON:  11/08/2018 FINDINGS: Cardiac shadows within normal limits. Aortic calcifications  are noted. Mild bibasilar opacities are again seen and stable. No new focal abnormality is noted. IMPRESSION: No change from the previous exam. Electronically Signed   By: Inez Catalina M.D.   On: 11/11/2018 08:52        Scheduled Meds: . atorvastatin  40 mg Oral QHS  . benzonatate  400 mg Oral QHS  . buPROPion  300 mg Oral Daily  . DULoxetine  60 mg Oral Daily  . gabapentin  600 mg Oral TID  . insulin aspart  0-15 Units Subcutaneous TID WC  . insulin aspart  0-5 Units Subcutaneous QHS  . insulin glargine  64 Units Subcutaneous QAC breakfast  . ipratropium-albuterol  3 mL Nebulization Q6H  . iron polysaccharides  150 mg Oral Daily  . methylPREDNISolone (SOLU-MEDROL) injection  40 mg Intravenous Daily  . metolazone  5 mg Oral Daily  . mometasone-formoterol  2 puff Inhalation BID  .  multivitamin with minerals  1 tablet Oral Daily  . pantoprazole  40 mg Oral QHS  . polyethylene glycol  17 g Oral Daily  . polyvinyl alcohol  1 drop Both Eyes BID  . potassium chloride  30 mEq Oral BID  . rivaroxaban  20 mg Oral Q supper  . verapamil  360 mg Oral Daily   Continuous Infusions:    LOS: 3 days     Cordelia Poche, MD [facility] 11/11/2018, 11:42 AM  If 7PM-7AM, please contact night-coverage [email or web address]

## 2018-11-11 NOTE — Progress Notes (Signed)
Physical Therapy Treatment Patient Details Name: Destiny Shelton MRN: [id number] DOB: 1945-08-26 Today's Date: 11/11/2018    History of Present Illness 73 y.o. female admitted with SOB and CHF exacerbation. PMhx: DM, CHF, COPD, chronic Pseudomonas colonization, interstitial lung disease, recurrent community-acquired pneumonia, diabetes, anxiety disorder, depression, GERD, paroxysmal atrial fibrillation    PT Comments    Pt remains limited by fatigue with activity but able to increase gait to 10' with RW with shaky legs. Pt educated for progression, need for 10L with gait and current need for [facility] for functional ambulation distance and assist of spouse. Pt with shaking and jerking of legs with HEP with Lt>Rt which pt reports as baseline. Pt encouraged to be OOB daily with nursing and continue HEP given limited ability to ambulate to strengthen legs.  HR 109-129 91% on 8L with drop to 87% with gait on 10L and 90% on 10L seated at end of session    Follow Up Recommendations  Home health PT;Supervision/Assistance - 24 hour     Equipment Recommendations  Hospital bed;Wheelchair (measurements PT)    Recommendations for Other Services       Precautions / Restrictions Precautions Precautions: Fall    Mobility  Bed Mobility Overal bed mobility: Modified Independent(with HOB elevated and use of rail)             General bed mobility comments: HOB elevated and use of rail  Transfers Overall transfer level: Needs assistance   Transfers: Sit to/from Stand Sit to Stand: Min assist         General transfer comment: cues for hand placement with assist to rise and stabilize in standing.  Ambulation/Gait Ambulation/Gait assistance: Min assist Gait Distance (Feet): 10 Feet Assistive device: Rolling walker (2 wheeled) Gait Pattern/deviations: Step-through pattern;Trunk flexed;Wide base of support   Gait velocity interpretation: <1.8 ft/sec, indicate of risk for recurrent  falls General Gait Details: pt able to walk 10' with heavy reliance on RW with verbal cues for direction and safety, pt with shaky knees but no buckling. Pt required 10L for gait with sats dropping to 87% with limited ambulation   Stairs             Wheelchair Mobility    Modified Rankin (Stroke Patients Only)       Balance Overall balance assessment: Needs assistance Sitting-balance support: No upper extremity supported Sitting balance-Leahy Scale: Fair     Standing balance support: Bilateral upper extremity supported Standing balance-Leahy Scale: Poor Standing balance comment: reliance on bil UE for standing                            Cognition Arousal/Alertness: Awake/alert Behavior During Therapy: WFL for tasks assessed/performed Overall Cognitive Status: Within Functional Limits for tasks assessed                                        Exercises General Exercises - Lower Extremity Long Arc Quad: AROM;15 reps;Both;Seated Hip Flexion/Marching: AROM;Both;10 reps;Seated    General Comments        Pertinent Vitals/Pain Pain Assessment: No/denies pain    Home Living                      Prior Function            PT Goals (current goals can now be found in  the care plan section) Progress towards PT goals: Progressing toward goals    Frequency    Min 3X/week      PT Plan Current plan remains appropriate    Co-evaluation              AM-PAC PT "6 Clicks" Mobility   Outcome Measure  Help needed turning from your back to your side while in a flat bed without using bedrails?: A Little Help needed moving from lying on your back to sitting on the side of a flat bed without using bedrails?: A Little Help needed moving to and from a bed to a chair (including a wheelchair)?: A Little Help needed standing up from a chair using your arms (e.g., wheelchair or bedside chair)?: A Little Help needed to walk in  hospital room?: A Little Help needed climbing 3-5 steps with a railing? : A Lot 6 Click Score: 17    End of Session Equipment Utilized During Treatment: Gait belt;Oxygen Activity Tolerance: Patient tolerated treatment well Patient left: in chair;with chair alarm set;with call bell/phone within reach Nurse Communication: Mobility status PT Visit Diagnosis: Unsteadiness on feet (R26.81);Other abnormalities of gait and mobility (R26.89);Muscle weakness (generalized) (M62.81)     Time: 6553-7482 PT Time Calculation (min) (ACUTE ONLY): 18 min  Charges:  $Therapeutic Activity: 8-22 mins                     [place], PT Acute Rehabilitation Services Pager: [phone] Office: [place] 11/11/2018, 1:13 PM

## 2018-11-11 NOTE — Care Management Important Message (Signed)
Important Message  Patient Details  Name: Destiny Shelton MRN: [id number] Date of Birth: [birth year]   Medicare Important Message Given:  Yes    [name] 11/11/2018, 3:20 PM

## 2018-11-12 LAB — BASIC METABOLIC PANEL
Anion gap: 13 (ref 5–15)
Anion gap: 11 (ref 5–15)
BUN: 31 mg/dL — ABNORMAL HIGH (ref 8–23)
BUN: 32 mg/dL — ABNORMAL HIGH (ref 8–23)
CO2: 41 mmol/L — ABNORMAL HIGH (ref 22–32)
CO2: 35 mmol/L — ABNORMAL HIGH (ref 22–32)
Calcium: 10.3 mg/dL (ref 8.9–10.3)
Calcium: 10.1 mg/dL (ref 8.9–10.3)
Chloride: 86 mmol/L — ABNORMAL LOW (ref 98–111)
Chloride: 89 mmol/L — ABNORMAL LOW (ref 98–111)
Creatinine, Ser: 0.86 mg/dL (ref 0.44–1.00)
Creatinine, Ser: 0.95 mg/dL (ref 0.44–1.00)
GFR calc Af Amer: >60 mL/min (ref >60)
GFR calc Af Amer: >60 mL/min (ref >60)
GFR calc non Af Amer: >60 mL/min (ref >60)
GFR calc non Af Amer: 59 mL/min — ABNORMAL LOW (ref >60)
Glucose, Bld: 107 mg/dL — ABNORMAL HIGH (ref 70–99)
Glucose, Bld: 359 mg/dL — ABNORMAL HIGH (ref 70–99)
Potassium: 2.6 mmol/L — CL (ref 3.5–5.1)
Potassium: 3.9 mmol/L (ref 3.5–5.1)
Sodium: 140 mmol/L (ref 135–145)
Sodium: 135 mmol/L (ref 135–145)

## 2018-11-12 LAB — GLUCOSE, CAPILLARY
Glucose-Capillary: 118 mg/dL — ABNORMAL HIGH (ref 70–99)
Glucose-Capillary: 251 mg/dL — ABNORMAL HIGH (ref 70–99)
Glucose-Capillary: 262 mg/dL — ABNORMAL HIGH (ref 70–99)

## 2018-11-12 LAB — MAGNESIUM: Magnesium: 2.4 mg/dL (ref 1.7–2.4)

## 2018-11-12 MED ORDER — POTASSIUM CHLORIDE CRYS ER 20 MEQ PO TBCR
40.0000 meq | EXTENDED_RELEASE_TABLET | Freq: Four times a day (QID) | ORAL | Status: DC
  Administered 2018-11-12: 40 meq via ORAL
  Filled 2018-11-12: qty 2

## 2018-11-12 MED ORDER — RIVAROXABAN 20 MG PO TABS
20.0000 mg | ORAL_TABLET | Freq: Every day | ORAL | Status: DC
  Administered 2018-11-12: 20 mg via ORAL
  Filled 2018-11-12: qty 1

## 2018-11-12 MED ORDER — POTASSIUM CHLORIDE CRYS ER 20 MEQ PO TBCR
40.0000 meq | EXTENDED_RELEASE_TABLET | ORAL | Status: AC
  Administered 2018-11-12 (×2): 40 meq via ORAL
  Filled 2018-11-12 (×2): qty 2

## 2018-11-12 MED ORDER — PREDNISONE 5 MG PO TABS: ORAL_TABLET | ORAL | 0 refills | Status: AC

## 2018-11-12 NOTE — Progress Notes (Signed)
Notified by lab Potassium 2.6 Paged [facility]) with this information.

## 2018-11-12 NOTE — TOC Initial Note (Signed)
Transition of Care [facility]) - Initial/Assessment Note    Patient Details  Name: Destiny Shelton MRN: [id number] Date of Birth: May 03, 1946  Transition of Care [facility]) CM/SW Contact:    Bethena Roys, RN Phone Number: 11/12/2018, 11:14 AM  Clinical Narrative:  Pt presented for Acute on Chronic Respiratory Failure with hypoxia. Patient has DM 02 in th home via [facility] 6 liters via [place] at rest and 8 Liters with activity. Patient wants to get hospital bed and wheelchair from [facility] CM did send referral to [facility] and DME will be delivered today. Medicare.gov choice provided to patient and patient chose [facility]. Referral provided to [facility]. SOC to begin within 24-48 hours post transition home. No further needs from CM at this time.                  Expected Discharge Plan: [place] Barriers to Discharge: No Barriers Identified   Patient Goals and CMS Choice Patient states their goals for this hospitalization and ongoing recovery are:: "to get back home" CMS Medicare.gov Compare Post Acute Care list provided to:: Patient Choice offered to / list presented to : Patient  Expected Discharge Plan and Services Expected Discharge Plan: [place] In-house Referral: NA Discharge Planning Services: CM Consult Post Acute Care Choice: Home Health, Durable Medical Equipment Living arrangements for the past 2 months: [place]                 DME Arranged: Hospital bed, Wheelchair manual DME Agency: Other - Comment([facility]) Date DME Agency Contacted: 11/12/18 Time DME Agency Contacted: [phone] Representative spoke with at DME Agency: [place] did not have beds- contacted Barnetta Chapel with [facility] HH Arranged: RN, Disease Management, PT [place] Agency: [place] Date [place]: 11/12/18 Time [place]: [place] Representative spoke with at [place]  Prior Living  Arrangements/Services Living arrangements for the past 2 months: [place] with:: Spouse Patient language and need for interpreter reviewed:: Yes Do you feel safe going back to the place where you live?: Yes      Need for Family Participation in Patient Care: Yes (Comment) Care giver support system in place?: Yes (comment) Current home services: DME(Pt has DME 02) Criminal Activity/Legal Involvement Pertinent to Current Situation/Hospitalization: No - Comment as needed  Activities of Daily Living Home Assistive Devices/Equipment: Cane (specify quad or straight), Eyeglasses, Walker (specify type)(straight) ADL Screening (condition at time of admission) Patient's cognitive ability adequate to safely complete daily activities?: Yes Is the patient deaf or have difficulty hearing?: No Does the patient have difficulty seeing, even when wearing glasses/contacts?: No Does the patient have difficulty concentrating, remembering, or making decisions?: No Patient able to express need for assistance with ADLs?: Yes Does the patient have difficulty dressing or bathing?: Yes Independently performs ADLs?: No Communication: Independent Dressing (OT): Needs assistance(Husband has been helping her recently) Is this a change from baseline?: Pre-admission baseline Grooming: Independent Feeding: Independent Bathing: Needs assistance(husband has been helping her recently.) Is this a change from baseline?: Change from baseline, expected to last <3 days Toileting: Independent In/Out Bed: Independent Walks in Home: Independent with device (comment)(walker, cane) Does the patient have difficulty walking or climbing stairs?: Yes Weakness of Legs: Both Weakness of Arms/Hands: None  Permission Sought/Granted Permission sought to share information with : Family Supports                Emotional  Assessment Appearance:: Appears stated age Attitude/Demeanor/Rapport: Engaged Affect  (typically observed): Accepting Orientation: : Oriented to Self, Oriented to Place, Oriented to  Time, Oriented to Situation Alcohol / Substance Use: Not Applicable Psych Involvement: No (comment)  Admission diagnosis:  SOB (shortness of breath) [R06.02] Patient Active Problem List   Diagnosis Date Noted  . Goals of care, counseling/discussion   . Palliative care by specialist   . Advanced care planning/counseling discussion   . Acute on chronic respiratory failure with hypoxia and hypercapnia ([place]) 11/08/2018  . Epistaxis 03/02/2017  . Enterococcal bacteremia 01/30/2017  . CAP (community acquired pneumonia) 01/29/2017  . Sepsis ([place]) 01/29/2017  . Bleeding from the nose 01/29/2017  . ILD (interstitial lung disease) ([place]) 06/26/2016  . Fall   . Hyponatremia 05/25/2016  . Metabolic acidosis 65/99/3570  . COPD (chronic obstructive pulmonary disease) ([place]) 05/25/2016  . COPD exacerbation ([place]) 01/22/2016  . Biomechanical lesion of rib cage 01/22/2016  . Anemia 01/22/2016  . Chronic diastolic CHF (congestive heart failure) ([place]) 12/12/2015  . Morbid obesity ([place]) 12/29/2014  . Benign neoplasm of sigmoid colon   . Chronic diastolic heart failure of unknown etiology ([place]) 08/17/2013  . Chronic respiratory failure ([place]) 04/21/2013  . Polyposis coli-attenuated 01/24/2013  . Hypokalemia 10/02/2012  . Sigmoid tubular adenoma with high-grade dysplasia 10/01/2012  . Gastric polyp 10/01/2012  . Schatzki's ring 10/01/2012  . PAF (paroxysmal atrial fibrillation) ([place]) 05/29/2012  . Pulmonary hypertension ([place]) 04/15/2012  . Inappropriate sinus tachycardia   . Obesity, Class II, BMI 35-39.9, with comorbidity (HCC)     Class: Chronic  . History of failed moderate sedation 06/10/2011  . Pulmonary nodules 09/11/2010  . Centrilobular emphysema ([place]) 09/11/2010  . CHRONIC DYSPNEA 07/26/2009  . Diabetes mellitus, type II, insulin dependent ([employer]) 07/29/2007    Class: Diagnosis of  . DIABETIC  PERIPHERAL NEUROPATHY 07/29/2007  . Anxiety state 07/29/2007  . Depression 07/29/2007  . Essential hypertension 07/29/2007  . NEPHROLITHIASIS 07/29/2007  . Osteoarthritis 07/29/2007  . Backache 07/29/2007  . Osteoporosis 07/29/2007  . Personal history of colonic polyps 04/28/2007   PCP:  Marton Redwood, MD Pharmacy:   [facility] [place] [address] Phone: [phone] Fax: [place], [place] AT [place] [address] [place] [address] Phone: [phone] Fax: [phone]     Social Determinants of Health (SDOH) Interventions    Readmission Risk Interventions No flowsheet data found.

## 2018-11-12 NOTE — TOC Progression Note (Signed)
Transition of Care [facility]) - Progression Note    Patient Details  Name: Destiny Shelton MRN: [id number] Date of Birth: 08/09/1945  Transition of Care [facility]) CM/SW Contact  [place], Ocie Cornfield, RN Phone Number: 11/12/2018, 2:58 PM  Clinical Narrative:  Hospital bed and Wheelchair will be delivered by [facility] by 1600 11-12-18. Husband to receive the DME at the home. No further needs at this time.    Expected Discharge Plan: [place] Barriers to Discharge: No Barriers Identified  Expected Discharge Plan and Services Expected Discharge Plan: [place] In-house Referral: NA Discharge Planning Services: CM Consult Post Acute Care Choice: Home Health, Durable Medical Equipment Living arrangements for the past 2 months: [place]                 DME Arranged: Hospital bed, Wheelchair manual DME Agency: Other - Comment([facility]) Date DME Agency Contacted: 11/12/18 Time DME Agency Contacted: [phone] Representative spoke with at DME Agency: [place] did not have beds- contacted Barnetta Chapel with [facility] HH Arranged: RN, Disease Management, PT [place] Agency: [place] Date [place]: 11/12/18 Time [place]: 1015 Representative spoke with at [place]: [place] ([place]) Interventions    Readmission Risk Interventions No flowsheet data found.

## 2018-11-12 NOTE — Discharge Summary (Signed)
Physician Discharge Summary  Karyssa [facility] DEY:[id number] DOB: 1945/08/22 DOA: 11/08/2018  PCP: Marton Redwood, MD  Admit date: 11/08/2018 Discharge date: 11/12/2018  Admitted From: Home Disposition: Home  Recommendations for Outpatient Follow-up:  1. Follow up with PCP in 1 week 2. Please obtain BMP/CBC in one week 3. Follow up with pulmonology 4. Please follow up on the following pending results: None  Home Health: PT, RN Equipment/Devices: Hospital bed  Discharge Condition: Guarded secondary to chronic illness CODE STATUS: DNR Diet recommendation: Heart healthy   Brief/Interim Summary:  Admission HPI written by Elwyn Reach, MD   Chief Complaint: Shortness of breath and cough  HPI: Destiny Shelton is a 73 y.o. female with medical history significant of COPD, chronic Pseudomonas colonization, interstitial lung disease, recurrent community-acquired pneumonia, diabetes, anxiety disorder, depression, GERD, paroxysmal atrial fibrillation who came to the ER with progressive shortness of breath and cough.  Patient was previously on 4 L of oxygen at home but now requiring up to 6 L in the hospital.  Denied any fever or chills.  Denied any nausea vomiting or diarrhea.  Patient is requiring nebulizer treatments with none minimal response in the ER so she is being admitted to the hospital for work-up.  She was noted to have increased fluids overall.  Prior to admission she was quite a nonrebreather back currently on 8 L.  Patient being admitted with acute on chronic respiratory failure.  She is also suspected to have some CHF with increased BNP but more importantly patient's increased BNP of up to 400.  She is also wheezing noticeably.  Oxygen sat drops at 80% on the 4 L and is only 90% on 6 L when she arrived.  She is being admitted to the medical service..  ED Course: Temperature is 98.3 blood pressure 91/71 pulse 135 respirate of 31 oxygen sat 84% on 4 L 94% on 8 L.  She has a white  count of 9.0 hemoglobin 9.6 and platelets 272.  Troponin 0 0.03 with calcium 8.7 otherwise chemistry appears to be within normal.  Chest x-ray showed bibasilar opacities suspected pneumonia.  Also possible pulmonary edema.   Hospital course:  Acute on chronic respiratory failure with hypoxia and hypercapnia Likely secondary to acute heart failure. Started on treatment for heart failure, pneumonia and ILD flare. Oxygen requirements remains stable at 10L. Patient uses 8 L chronically at home. Palliative care consulted and goals include continued scope of care. Procalcitonin undetectable. Weaned to home oxygen. PT recommending home health.   Acute on chronic diastolic heart failure Last EF of 55% in August 2019. Treated with IV lasix. Resolved. Continue home dose of lasix on discharge.  Interstitial lung disease Concern for possible flare. Started on IV steroids. Treated with Solu-medrol. Discharged on a prednisone taper down to home dose of prednisone 5 mg daily  Paroxysmal atrial fibrillation Rate controlled. Continue Xarelto.  Diabetes mellitus, type 2 Mildly controlled. Likely worsened secondary to solumedrol.   Hyperlipidemia Continue Lipitor  Acute kidney injury Secondary to overdiuresis. Resolved with holding lasix. Recheck BMP as an outpatient.  Discharge Diagnoses:  Principal Problem:   Acute on chronic respiratory failure with hypoxia and hypercapnia (HCC) Active Problems:   Diabetes mellitus, type II, insulin dependent (HCC)   Essential hypertension   Pulmonary hypertension (HCC)   PAF (paroxysmal atrial fibrillation) (HCC)   Chronic diastolic CHF (congestive heart failure) (HCC)   ILD (interstitial lung disease) ([place])   Goals of care, counseling/discussion   Palliative  care by specialist   Advanced care planning/counseling discussion    Discharge Instructions   Allergies as of 11/12/2018      Reactions   Aspirin Shortness Of Breath   Nsaids Shortness Of  Breath      Medication List    STOP taking these medications   azithromycin 250 MG tablet Commonly known as: ZITHROMAX     TAKE these medications   acetaminophen 500 MG tablet Commonly known as: TYLENOL Take 1 tablet (500 mg total) by mouth every 6 (six) hours as needed for mild pain or headache.   albuterol (2.5 MG/3ML) 0.083% nebulizer solution Commonly known as: PROVENTIL USE 1 VIAL VIA NEBULIZER EVERY 6 HOURS AS NEEDED FOR WHEEZING OR SHORTNESS OF BREATH What changed:   how much to take  how to take this  when to take this  reasons to take this  additional instructions   ALPRAZolam 0.5 MG tablet Commonly known as: XANAX Take 0.25-0.5 mg by mouth daily as needed for anxiety.   Artificial Tears 1.4 % ophthalmic solution Generic drug: polyvinyl alcohol Place 1 drop into both eyes 2 (two) times daily.   atorvastatin 40 MG tablet Commonly known as: LIPITOR Take 40 mg by mouth at bedtime.   benzonatate 200 MG capsule Commonly known as: TESSALON Take 600 mg by mouth at bedtime.   buPROPion 300 MG 24 hr tablet Commonly known as: WELLBUTRIN XL Take 300 mg by mouth daily.   Daliresp 500 MCG Tabs tablet Generic drug: roflumilast Take 500 mcg by mouth daily.   DULoxetine 60 MG capsule Commonly known as: CYMBALTA Take 60 mg by mouth daily.   gabapentin 600 MG tablet Commonly known as: NEURONTIN Take 600 mg by mouth 3 (three) times daily.   HYDROcodone-acetaminophen 5-325 MG tablet Commonly known as: NORCO/VICODIN Take 1 tablet by mouth See admin instructions. EVERY 4-6 HOURS AS NEEDED FOR PAIN OR SHORTNESS OF BREATH   insulin glargine 100 UNIT/ML injection Commonly known as: Lantus Inject 0.66 mLs (66 Units total) into the skin daily before breakfast. What changed: how much to take   iron polysaccharides 150 MG capsule Commonly known as: NIFEREX Take 1 capsule (150 mg total) by mouth 2 (two) times daily. What changed: when to take this   levalbuterol  45 MCG/ACT inhaler Commonly known as: Xopenex HFA Inhale 1-2 puffs into the lungs every 4 (four) hours as needed for wheezing. What changed:   how much to take  reasons to take this   Mucinex Maximum Strength 1200 MG Tb12 Generic drug: Guaifenesin Take 1,200 mg by mouth 2 (two) times daily as needed (for cough or congestion).   multivitamin with minerals Tabs tablet Take 1 tablet by mouth daily.   NovoLOG FlexPen 100 UNIT/ML FlexPen Generic drug: insulin aspart Inject 5 Units into the skin 3 (three) times daily as needed for high blood sugar.   omeprazole 20 MG tablet Commonly known as: PRILOSEC OTC Take 20 mg by mouth daily.   OXYGEN Inhale 5-8 L into the lungs See admin instructions. 5 liters when at rest and 8 liters when ambulatory   polyethylene glycol 17 g packet Commonly known as: MIRALAX / GLYCOLAX Take 17 g by mouth daily. Mix in 8 oz liquid and drink   potassium chloride 10 MEQ tablet Commonly known as: K-DUR TAKE 3 TABLETS BY MOUTH TWICE DAILY. What changed: when to take this   predniSONE 5 MG tablet Commonly known as: DELTASONE Take 6 tablets (30 mg total) by mouth daily with  breakfast for 3 days, THEN 4 tablets (20 mg total) daily with breakfast for 3 days, THEN 2 tablets (10 mg total) daily with breakfast for 3 days, THEN 1 tablet (5 mg total) daily with breakfast. Start taking on: November 12, 2018 What changed: See the new instructions.   Spiriva HandiHaler 18 MCG inhalation capsule Generic drug: tiotropium INHALE THE CONTENTS OF 1 CAPSULE VIA HANDIHALER BY MOUTH EVERY MORNING What changed: See the new instructions.   Symbicort 160-4.5 MCG/ACT inhaler Generic drug: budesonide-formoterol INHALE 2 PUFFS BY MOUTH TWICE DAILY What changed: when to take this   tobramycin (PF) 300 MG/5ML nebulizer solution Commonly known as: TOBI Take 5 mLs (300 mg total) by nebulization 2 (two) times daily. Every other month DX: J44.9   tolterodine 4 MG 24 hr capsule  Commonly known as: DETROL LA Take 4 mg by mouth daily as needed. Overactive bladder   torsemide 20 MG tablet Commonly known as: DEMADEX Take 80 mg (4 tabs) in am and 40 mg (2 tabs) in pm What changed:   how much to take  when to take this   verapamil 360 MG 24 hr capsule Commonly known as: VERELAN PM TAKE 1 CAPSULE(360 MG) BY MOUTH DAILY What changed: See the new instructions.   Xarelto 20 MG Tabs tablet Generic drug: rivaroxaban Take 20 mg by mouth daily with supper.            Durable Medical Equipment  (From admission, onward)         Start     Ordered   11/12/18 0817  For home use only DME standard manual wheelchair with seat cushion  Once    Comments: Patient suffers from interstitial lung disease, heart failure which impairs their ability to perform daily activities like bathing and dressing in the home.  A cane, crutch or walker will not resolve issue with performing activities of daily living. A wheelchair will allow patient to safely perform daily activities. Patient can safely propel the wheelchair in the home or has a caregiver who can provide assistance. Length of need Lifetime. Accessories: elevating leg rests (ELRs), wheel locks, extensions and anti-tippers.   11/12/18 0816   11/12/18 0816  For home use only DME Hospital bed  Once    Question Answer Comment  Length of Need Lifetime   Bed type Semi-electric      11/12/18 0816         Follow-up Information    [facility], [facility] Follow up.   Specialty: Home Health Services Why: Registered Nurse, Physical Therapy Contact information: [place] [address] [phone]        Supply, Family [facility] Follow up.   Why: Hospital Bed, Wheelchair Contact information: [place] [address] [phone]          Allergies  Allergen Reactions  . Aspirin Shortness Of Breath  . Nsaids Shortness Of Breath    Consultations:  None    Procedures/Studies: Dg Chest Port 1 View  Result Date: 11/11/2018 CLINICAL DATA:  Acute respiratory failure EXAM: PORTABLE CHEST 1 VIEW COMPARISON:  11/08/2018 FINDINGS: Cardiac shadows within normal limits. Aortic calcifications are noted. Mild bibasilar opacities are again seen and stable. No new focal abnormality is noted. IMPRESSION: No change from the previous exam. Electronically Signed   By: Inez Catalina M.D.   On: 11/11/2018 08:52   Dg Chest Portable 1 View  Result Date: 11/08/2018 CLINICAL DATA:  Rhonchi.  Weakness. EXAM: PORTABLE  CHEST 1 VIEW COMPARISON:  March 12, 2018 and November 10, 2017 FINDINGS: The cardiomediastinal silhouette is stable. No pneumothorax. Bibasilar opacities are similar since previous studies. The opacity may be a little greater in the left base in the interval. IMPRESSION: 1. Bibasilar opacities are identified. The opacity in the right base is stable and chronic. The opacity in left base is mildly more prominent the interval and could represent increased atelectasis or a developing infiltrate superimposed on a chronic left basilar process. Electronically Signed   By: Dorise Bullion III M.D   On: 11/08/2018 17:16      Subjective: Patient feels better today. Breathing fine. No chest pain or dyspnea.  Discharge Exam: Vitals:   11/12/18 0908 11/12/18 1350  BP: 126/62   Pulse:    Resp:    Temp:    SpO2:  97%   Vitals:   11/12/18 0603 11/12/18 0816 11/12/18 0908 11/12/18 1350  BP: (!) 114/58  126/62   Pulse: 91     Resp: 18     Temp: 98 F (36.7 C)     TempSrc: Oral     SpO2: 95% 99%  97%  Weight: 85.8 kg     Height:        General: Pt is alert, awake, not in acute distress Cardiovascular: RRR, S1/S2 +, no rubs, no gallops Respiratory: Diminished breath sounds, mild wheezing Abdominal: Soft, NT, ND, bowel sounds + Extremities: no edema, no cyanosis    The results of significant diagnostics from this hospitalization (including imaging,  microbiology, ancillary and laboratory) are listed below for reference.     Microbiology: Recent Results (from the past 240 hour(s))  SARS Coronavirus 2 (CEPHEID- Performed in [place] hospital lab), Hosp Order     Status: None   Collection Time: 11/08/18  5:36 PM   Specimen: Nasopharyngeal Swab  Result Value Ref Range Status   SARS Coronavirus 2 NEGATIVE NEGATIVE Final    Comment: (NOTE) If result is NEGATIVE SARS-CoV-2 target nucleic acids are NOT DETECTED. The SARS-CoV-2 RNA is generally detectable in upper and lower  respiratory specimens during the acute phase of infection. The lowest  concentration of SARS-CoV-2 viral copies this assay can detect is 250  copies / mL. A negative result does not preclude SARS-CoV-2 infection  and should not be used as the sole basis for treatment or other  patient management decisions.  A negative result may occur with  improper specimen collection / handling, submission of specimen other  than nasopharyngeal swab, presence of viral mutation(s) within the  areas targeted by this assay, and inadequate number of viral copies  (<250 copies / mL). A negative result must be combined with clinical  observations, patient history, and epidemiological information. If result is POSITIVE SARS-CoV-2 target nucleic acids are DETECTED. The SARS-CoV-2 RNA is generally detectable in upper and lower  respiratory specimens dur ing the acute phase of infection.  Positive  results are indicative of active infection with SARS-CoV-2.  Clinical  correlation with patient history and other diagnostic information is  necessary to determine patient infection status.  Positive results do  not rule out bacterial infection or co-infection with other viruses. If result is PRESUMPTIVE POSTIVE SARS-CoV-2 nucleic acids MAY BE PRESENT.   A presumptive positive result was obtained on the submitted specimen  and confirmed on repeat testing.  While 2019 novel coronavirus   (SARS-CoV-2) nucleic acids may be present in the submitted sample  additional confirmatory testing may be necessary for epidemiological  and / or clinical management purposes  to differentiate between  SARS-CoV-2 and other Sarbecovirus currently known to infect humans.  If clinically indicated additional testing with an alternate test  methodology [phone]) is advised. The SARS-CoV-2 RNA is generally  detectable in upper and lower respiratory sp ecimens during the acute  phase of infection. The expected result is Negative. Fact Sheet for Patients:  StrictlyIdeas.no Fact Sheet for Healthcare Providers: BankingDealers.co.za This test is not yet approved or cleared by the [place] FDA and has been authorized for detection and/or diagnosis of SARS-CoV-2 by FDA under an Emergency Use Authorization (EUA).  This EUA will remain in effect (meaning this test can be used) for the duration of the COVID-19 declaration under Section 564(b)(1) of the Act, 21 U.S.C. section 360bbb-3(b)(1), unless the authorization is terminated or revoked sooner. Performed at [facility], [place] [address], [place], [place] [address]   Blood culture (routine x 2)     Status: None (Preliminary result)   Collection Time: 11/08/18  6:25 PM   Specimen: BLOOD  Result Value Ref Range Status   Specimen Description BLOOD BLOOD RIGHT FOREARM  Final   Special Requests   Final    BOTTLES DRAWN AEROBIC AND ANAEROBIC Blood Culture results may not be optimal due to an inadequate volume of blood received in culture bottles   Culture   Final    NO GROWTH 4 DAYS Performed at [facility], [place] [address], [place], [place] [address]    Report Status PENDING  Incomplete  Blood culture (routine x 2)     Status: None (Preliminary result)   Collection Time: 11/08/18  6:40 PM   Specimen: BLOOD  Result Value Ref Range Status   Specimen Description BLOOD LEFT ANTECUBITAL   Final   Special Requests   Final    BOTTLES DRAWN AEROBIC AND ANAEROBIC Blood Culture adequate volume   Culture   Final    NO GROWTH 4 DAYS Performed at [facility], [place] [address], [place], [place] [address]    Report Status PENDING  Incomplete     Labs: BNP (last 3 results) Recent Labs    11/08/18 1708  BNP 329.5*   Basic Metabolic Panel: Recent Labs  Lab 11/09/18 0423 11/10/18 0825 11/11/18 0416 11/11/18 1205 11/12/18 0450 11/12/18 0556  NA 143 142 139 138 140  --   K 3.7 3.4* 2.8* 3.6 2.6*  --   CL 98 89* 84* 85* 86*  --   CO2 35* 39* 39* 37* 41*  --   GLUCOSE 166* 219* 119* 160* 107*  --   BUN 10 27* 37* 38* 31*  --   CREATININE 0.89 1.27* 1.45* 1.35* 0.86  --   CALCIUM 8.7* 9.7 10.2 10.2 10.3  --   MG  --   --   --   --   --  2.4   Liver Function Tests: Recent Labs  Lab 11/08/18 1708 11/09/18 0423  AST 21 22  ALT 17 19  ALKPHOS 51 55  BILITOT 0.5 1.0  PROT 6.0* 6.4*  ALBUMIN 3.2* 3.4*   No results for input(s): LIPASE, AMYLASE in the last 168 hours. No results for input(s): AMMONIA in the last 168 hours. CBC: Recent Labs  Lab 11/08/18 1708 11/09/18 0423  WBC 9.0 6.5  NEUTROABS 6.8  --   HGB 9.6* 10.2*  HCT 35.3* 36.7  MCV 81.7 79.8*  PLT 272 270   Cardiac Enzymes: Recent Labs  Lab 11/08/18 1708  TROPONINI 0.03*   BNP: Invalid input(s): POCBNP CBG: Recent Labs  Lab 11/11/18 1133 11/11/18 1638 11/11/18 2147 11/12/18 0738 11/12/18 1115  GLUCAP 178* 161* 156* 118* 251*   D-Dimer No results for input(s): DDIMER in the last 72 hours. Hgb A1c No results for input(s): HGBA1C in the last 72 hours. Lipid Profile No results for input(s): CHOL, HDL, LDLCALC, TRIG, CHOLHDL, LDLDIRECT in the last 72 hours. Thyroid function studies No results for input(s): TSH, T4TOTAL, T3FREE, THYROIDAB in the last 72 hours.  Invalid input(s): FREET3 Anemia work up No results for input(s): VITAMINB12, FOLATE, FERRITIN, TIBC, IRON, RETICCTPCT in  the last 72 hours. Urinalysis    Component Value Date/Time   COLORURINE STRAW (A) 11/10/2018 0449   APPEARANCEUR CLEAR 11/10/2018 0449   LABSPEC 1.010 11/10/2018 0449   PHURINE 6.0 11/10/2018 0449   GLUCOSEU NEGATIVE 11/10/2018 0449   GLUCOSEU NEGATIVE 03/04/2018 1054   HGBUR NEGATIVE 11/10/2018 0449   BILIRUBINUR NEGATIVE 11/10/2018 0449   KETONESUR NEGATIVE 11/10/2018 0449   PROTEINUR NEGATIVE 11/10/2018 0449   UROBILINOGEN 0.2 03/04/2018 1054   NITRITE NEGATIVE 11/10/2018 0449   LEUKOCYTESUR NEGATIVE 11/10/2018 0449   Sepsis Labs Invalid input(s): PROCALCITONIN,  WBC,  LACTICIDVEN Microbiology Recent Results (from the past 240 hour(s))  SARS Coronavirus 2 (CEPHEID- Performed in [place] hospital lab), Hosp Order     Status: None   Collection Time: 11/08/18  5:36 PM   Specimen: Nasopharyngeal Swab  Result Value Ref Range Status   SARS Coronavirus 2 NEGATIVE NEGATIVE Final    Comment: (NOTE) If result is NEGATIVE SARS-CoV-2 target nucleic acids are NOT DETECTED. The SARS-CoV-2 RNA is generally detectable in upper and lower  respiratory specimens during the acute phase of infection. The lowest  concentration of SARS-CoV-2 viral copies this assay can detect is 250  copies / mL. A negative result does not preclude SARS-CoV-2 infection  and should not be used as the sole basis for treatment or other  patient management decisions.  A negative result may occur with  improper specimen collection / handling, submission of specimen other  than nasopharyngeal swab, presence of viral mutation(s) within the  areas targeted by this assay, and inadequate number of viral copies  (<250 copies / mL). A negative result must be combined with clinical  observations, patient history, and epidemiological information. If result is POSITIVE SARS-CoV-2 target nucleic acids are DETECTED. The SARS-CoV-2 RNA is generally detectable in upper and lower  respiratory specimens dur ing the acute  phase of infection.  Positive  results are indicative of active infection with SARS-CoV-2.  Clinical  correlation with patient history and other diagnostic information is  necessary to determine patient infection status.  Positive results do  not rule out bacterial infection or co-infection with other viruses. If result is PRESUMPTIVE POSTIVE SARS-CoV-2 nucleic acids MAY BE PRESENT.   A presumptive positive result was obtained on the submitted specimen  and confirmed on repeat testing.  While 2019 novel coronavirus  (SARS-CoV-2) nucleic acids may be present in the submitted sample  additional confirmatory testing may be necessary for epidemiological  and / or clinical management purposes  to differentiate between  SARS-CoV-2 and other Sarbecovirus currently known to infect humans.  If clinically indicated additional testing with an alternate test  methodology [phone]) is advised. The SARS-CoV-2 RNA is generally  detectable in upper and lower respiratory sp ecimens during the acute  phase of infection. The expected result is Negative. Fact Sheet for Patients:  StrictlyIdeas.no Fact Sheet for Healthcare Providers: BankingDealers.co.za This test is not yet approved or cleared by the [place] FDA and has been authorized for detection and/or diagnosis of SARS-CoV-2 by FDA under an Emergency Use Authorization (EUA).  This EUA will remain in effect (meaning this test can be used) for the duration of the COVID-19 declaration under Section 564(b)(1) of the Act, 21 U.S.C. section 360bbb-3(b)(1), unless the authorization is terminated or revoked sooner. Performed at [facility], [place] [address] [place], [place] [address]   Blood culture (routine x 2)     Status: None (Preliminary result)   Collection Time: 11/08/18  6:25 PM   Specimen: BLOOD  Result Value Ref Range Status   Specimen Description BLOOD BLOOD RIGHT FOREARM  Final    Special Requests   Final    BOTTLES DRAWN AEROBIC AND ANAEROBIC Blood Culture results may not be optimal due to an inadequate volume of blood received in culture bottles   Culture   Final    NO GROWTH 4 DAYS Performed at [facility], [place] [address], [place], [place] [address]    Report Status PENDING  Incomplete  Blood culture (routine x 2)     Status: None (Preliminary result)   Collection Time: 11/08/18  6:40 PM   Specimen: BLOOD  Result Value Ref Range Status   Specimen Description BLOOD LEFT ANTECUBITAL  Final   Special Requests   Final    BOTTLES DRAWN AEROBIC AND ANAEROBIC Blood Culture adequate volume   Culture   Final    NO GROWTH 4 DAYS Performed at [facility], [place] [address], [place], [place] [address]    Report Status PENDING  Incomplete     SIGNED:   Cordelia Poche, MD [facility] 11/12/2018, 2:05 PM

## 2018-11-13 LAB — CULTURE, BLOOD (ROUTINE X 2)
Culture: NO GROWTH
Culture: NO GROWTH
Special Requests: ADEQUATE

## 2018-11-14 DIAGNOSIS — J9621 Acute and chronic respiratory failure with hypoxia: Secondary | ICD-10-CM | POA: Diagnosis not present

## 2018-11-14 DIAGNOSIS — M1990 Unspecified osteoarthritis, unspecified site: Secondary | ICD-10-CM | POA: Diagnosis not present

## 2018-11-14 DIAGNOSIS — I480 Paroxysmal atrial fibrillation: Secondary | ICD-10-CM | POA: Diagnosis not present

## 2018-11-14 DIAGNOSIS — E1140 Type 2 diabetes mellitus with diabetic neuropathy, unspecified: Secondary | ICD-10-CM | POA: Diagnosis not present

## 2018-11-14 DIAGNOSIS — M810 Age-related osteoporosis without current pathological fracture: Secondary | ICD-10-CM | POA: Diagnosis not present

## 2018-11-14 DIAGNOSIS — J440 Chronic obstructive pulmonary disease with acute lower respiratory infection: Secondary | ICD-10-CM | POA: Diagnosis not present

## 2018-11-14 DIAGNOSIS — S40922D Unspecified superficial injury of left upper arm, subsequent encounter: Secondary | ICD-10-CM | POA: Diagnosis not present

## 2018-11-14 DIAGNOSIS — J9622 Acute and chronic respiratory failure with hypercapnia: Secondary | ICD-10-CM | POA: Diagnosis not present

## 2018-11-14 DIAGNOSIS — E785 Hyperlipidemia, unspecified: Secondary | ICD-10-CM | POA: Diagnosis not present

## 2018-11-14 DIAGNOSIS — D509 Iron deficiency anemia, unspecified: Secondary | ICD-10-CM | POA: Diagnosis not present

## 2018-11-14 DIAGNOSIS — E782 Mixed hyperlipidemia: Secondary | ICD-10-CM | POA: Diagnosis not present

## 2018-11-14 DIAGNOSIS — J441 Chronic obstructive pulmonary disease with (acute) exacerbation: Secondary | ICD-10-CM | POA: Diagnosis not present

## 2018-11-14 DIAGNOSIS — F329 Major depressive disorder, single episode, unspecified: Secondary | ICD-10-CM | POA: Diagnosis not present

## 2018-11-14 DIAGNOSIS — J849 Interstitial pulmonary disease, unspecified: Secondary | ICD-10-CM | POA: Diagnosis not present

## 2018-11-14 DIAGNOSIS — M797 Fibromyalgia: Secondary | ICD-10-CM | POA: Diagnosis not present

## 2018-11-14 DIAGNOSIS — I272 Pulmonary hypertension, unspecified: Secondary | ICD-10-CM | POA: Diagnosis not present

## 2018-11-14 DIAGNOSIS — G8929 Other chronic pain: Secondary | ICD-10-CM | POA: Diagnosis not present

## 2018-11-14 DIAGNOSIS — F419 Anxiety disorder, unspecified: Secondary | ICD-10-CM | POA: Diagnosis not present

## 2018-11-14 DIAGNOSIS — I110 Hypertensive heart disease with heart failure: Secondary | ICD-10-CM | POA: Diagnosis not present

## 2018-11-14 DIAGNOSIS — J188 Other pneumonia, unspecified organism: Secondary | ICD-10-CM | POA: Diagnosis not present

## 2018-11-14 DIAGNOSIS — K5790 Diverticulosis of intestine, part unspecified, without perforation or abscess without bleeding: Secondary | ICD-10-CM | POA: Diagnosis not present

## 2018-11-14 DIAGNOSIS — K210 Gastro-esophageal reflux disease with esophagitis: Secondary | ICD-10-CM | POA: Diagnosis not present

## 2018-11-14 DIAGNOSIS — I5033 Acute on chronic diastolic (congestive) heart failure: Secondary | ICD-10-CM | POA: Diagnosis not present

## 2018-11-14 DIAGNOSIS — M549 Dorsalgia, unspecified: Secondary | ICD-10-CM | POA: Diagnosis not present

## 2018-11-17 DIAGNOSIS — J9621 Acute and chronic respiratory failure with hypoxia: Secondary | ICD-10-CM | POA: Diagnosis not present

## 2018-11-17 DIAGNOSIS — I5033 Acute on chronic diastolic (congestive) heart failure: Secondary | ICD-10-CM | POA: Diagnosis not present

## 2018-11-17 DIAGNOSIS — J9622 Acute and chronic respiratory failure with hypercapnia: Secondary | ICD-10-CM | POA: Diagnosis not present

## 2018-11-17 DIAGNOSIS — J188 Other pneumonia, unspecified organism: Secondary | ICD-10-CM | POA: Diagnosis not present

## 2018-11-17 DIAGNOSIS — J441 Chronic obstructive pulmonary disease with (acute) exacerbation: Secondary | ICD-10-CM | POA: Diagnosis not present

## 2018-11-17 DIAGNOSIS — I110 Hypertensive heart disease with heart failure: Secondary | ICD-10-CM | POA: Diagnosis not present

## 2018-11-18 DIAGNOSIS — J441 Chronic obstructive pulmonary disease with (acute) exacerbation: Secondary | ICD-10-CM | POA: Diagnosis not present

## 2018-11-18 DIAGNOSIS — J188 Other pneumonia, unspecified organism: Secondary | ICD-10-CM | POA: Diagnosis not present

## 2018-11-18 DIAGNOSIS — J9621 Acute and chronic respiratory failure with hypoxia: Secondary | ICD-10-CM | POA: Diagnosis not present

## 2018-11-18 DIAGNOSIS — I5033 Acute on chronic diastolic (congestive) heart failure: Secondary | ICD-10-CM | POA: Diagnosis not present

## 2018-11-18 DIAGNOSIS — J9622 Acute and chronic respiratory failure with hypercapnia: Secondary | ICD-10-CM | POA: Diagnosis not present

## 2018-11-18 DIAGNOSIS — I110 Hypertensive heart disease with heart failure: Secondary | ICD-10-CM | POA: Diagnosis not present

## 2018-11-18 DIAGNOSIS — I509 Heart failure, unspecified: Secondary | ICD-10-CM | POA: Diagnosis not present

## 2018-11-22 DIAGNOSIS — Z7189 Other specified counseling: Secondary | ICD-10-CM | POA: Diagnosis not present

## 2018-11-22 DIAGNOSIS — I5032 Chronic diastolic (congestive) heart failure: Secondary | ICD-10-CM | POA: Diagnosis not present

## 2018-11-22 DIAGNOSIS — J9620 Acute and chronic respiratory failure, unspecified whether with hypoxia or hypercapnia: Secondary | ICD-10-CM | POA: Diagnosis not present

## 2018-11-22 DIAGNOSIS — J449 Chronic obstructive pulmonary disease, unspecified: Secondary | ICD-10-CM | POA: Diagnosis not present

## 2018-11-22 DIAGNOSIS — R300 Dysuria: Secondary | ICD-10-CM | POA: Diagnosis not present

## 2018-11-23 DIAGNOSIS — I5033 Acute on chronic diastolic (congestive) heart failure: Secondary | ICD-10-CM | POA: Diagnosis not present

## 2018-11-23 DIAGNOSIS — J9621 Acute and chronic respiratory failure with hypoxia: Secondary | ICD-10-CM | POA: Diagnosis not present

## 2018-11-23 DIAGNOSIS — J188 Other pneumonia, unspecified organism: Secondary | ICD-10-CM | POA: Diagnosis not present

## 2018-11-23 DIAGNOSIS — J441 Chronic obstructive pulmonary disease with (acute) exacerbation: Secondary | ICD-10-CM | POA: Diagnosis not present

## 2018-11-23 DIAGNOSIS — I110 Hypertensive heart disease with heart failure: Secondary | ICD-10-CM | POA: Diagnosis not present

## 2018-11-23 DIAGNOSIS — J9622 Acute and chronic respiratory failure with hypercapnia: Secondary | ICD-10-CM | POA: Diagnosis not present

## 2018-11-23 DIAGNOSIS — R309 Painful micturition, unspecified: Secondary | ICD-10-CM | POA: Diagnosis not present

## 2018-11-25 ENCOUNTER — Telehealth: Payer: Self-pay | Admitting: Licensed Clinical Social Worker

## 2018-11-25 NOTE — Telephone Encounter (Signed)
Palliative Care SW left a vm with patient to schedule a visit. 

## 2018-11-26 DIAGNOSIS — I110 Hypertensive heart disease with heart failure: Secondary | ICD-10-CM | POA: Diagnosis not present

## 2018-11-26 DIAGNOSIS — J9622 Acute and chronic respiratory failure with hypercapnia: Secondary | ICD-10-CM | POA: Diagnosis not present

## 2018-11-26 DIAGNOSIS — I5033 Acute on chronic diastolic (congestive) heart failure: Secondary | ICD-10-CM | POA: Diagnosis not present

## 2018-11-26 DIAGNOSIS — J188 Other pneumonia, unspecified organism: Secondary | ICD-10-CM | POA: Diagnosis not present

## 2018-11-26 DIAGNOSIS — J9621 Acute and chronic respiratory failure with hypoxia: Secondary | ICD-10-CM | POA: Diagnosis not present

## 2018-11-26 DIAGNOSIS — J441 Chronic obstructive pulmonary disease with (acute) exacerbation: Secondary | ICD-10-CM | POA: Diagnosis not present

## 2018-11-30 ENCOUNTER — Other Ambulatory Visit: Payer: Medicare Other | Admitting: Licensed Clinical Social Worker

## 2018-11-30 DIAGNOSIS — I110 Hypertensive heart disease with heart failure: Secondary | ICD-10-CM | POA: Diagnosis not present

## 2018-11-30 DIAGNOSIS — J188 Other pneumonia, unspecified organism: Secondary | ICD-10-CM | POA: Diagnosis not present

## 2018-11-30 DIAGNOSIS — J9622 Acute and chronic respiratory failure with hypercapnia: Secondary | ICD-10-CM | POA: Diagnosis not present

## 2018-11-30 DIAGNOSIS — I5033 Acute on chronic diastolic (congestive) heart failure: Secondary | ICD-10-CM | POA: Diagnosis not present

## 2018-11-30 DIAGNOSIS — J9621 Acute and chronic respiratory failure with hypoxia: Secondary | ICD-10-CM | POA: Diagnosis not present

## 2018-11-30 DIAGNOSIS — J441 Chronic obstructive pulmonary disease with (acute) exacerbation: Secondary | ICD-10-CM | POA: Diagnosis not present

## 2018-11-30 DIAGNOSIS — Z515 Encounter for palliative care: Secondary | ICD-10-CM

## 2018-12-01 ENCOUNTER — Other Ambulatory Visit: Payer: Self-pay

## 2018-12-01 NOTE — Progress Notes (Signed)
COMMUNITY PALLIATIVE CARE SW NOTE  PATIENT NAME: Destiny Shelton DOB: December 03, 1945 MRN: [id number]  PRIMARY CARE PROVIDER: Marton Redwood, MD  RESPONSIBLE PARTY:  Acct ID - Guarantor Home Phone Work Phone Relationship Acct Type  [id number] - [place]* [phone]  Self P/F     [address] , [place],  [address]   Due to the COVID-19 crisis, this virtual check-in visit was done via telephone from my office and it was initiated and consent given by this patient and or family.   PLAN OF CARE and INTERVENTIONS:             1. GOALS OF CARE/ ADVANCE CARE PLANNING:  Patient's goal is to remain in her home.  She is a DNR. 2. SOCIAL/EMOTIONAL/SPIRITUAL ASSESSMENT/ INTERVENTIONS:  SW conducted a virtual check-in visit with patient at her home.  She denied pain.  She reports being able to do all of her own ADLs.  Patient agreed to SW contacting her in the future, but she stated she does not like people coming to her home.   3. PATIENT/CAREGIVER EDUCATION/ COPING:  Patient expresses her feelings openly.  SW provided education regarding the Palliative Care/THN program and she stated she understood. 4. PERSONAL EMERGENCY PLAN:  EMS will be contacted. 5. COMMUNITY RESOURCES COORDINATION/ HEALTH CARE NAVIGATION:  Patient stated a [facility] RN visits her one time a week. 6. FINANCIAL/LEGAL CONCERNS/INTERVENTIONS:  None identified by patient.     SOCIAL HX:  Social History   Tobacco Use  . Smoking status: Former Smoker    Packs/day: 2.00    Years: 30.00    Pack years: 60.00    Types: Cigarettes    Quit date: 06/02/1994    Years since quitting: 24.5  . Smokeless tobacco: Never Used  Substance Use Topics  . Alcohol use: No    CODE STATUS:  DNR  ADVANCED DIRECTIVES: N MOST FORM COMPLETE:  N HOSPICE EDUCATION PROVIDED:  N Duration of visit and documentation:  30 minutes.     Destiny Corn Kenyia [place], LCSW

## 2018-12-03 DIAGNOSIS — J441 Chronic obstructive pulmonary disease with (acute) exacerbation: Secondary | ICD-10-CM | POA: Diagnosis not present

## 2018-12-03 DIAGNOSIS — I110 Hypertensive heart disease with heart failure: Secondary | ICD-10-CM | POA: Diagnosis not present

## 2018-12-03 DIAGNOSIS — J188 Other pneumonia, unspecified organism: Secondary | ICD-10-CM | POA: Diagnosis not present

## 2018-12-03 DIAGNOSIS — I5033 Acute on chronic diastolic (congestive) heart failure: Secondary | ICD-10-CM | POA: Diagnosis not present

## 2018-12-03 DIAGNOSIS — J9622 Acute and chronic respiratory failure with hypercapnia: Secondary | ICD-10-CM | POA: Diagnosis not present

## 2018-12-03 DIAGNOSIS — J9621 Acute and chronic respiratory failure with hypoxia: Secondary | ICD-10-CM | POA: Diagnosis not present

## 2018-12-08 DIAGNOSIS — J441 Chronic obstructive pulmonary disease with (acute) exacerbation: Secondary | ICD-10-CM | POA: Diagnosis not present

## 2018-12-08 DIAGNOSIS — J9622 Acute and chronic respiratory failure with hypercapnia: Secondary | ICD-10-CM | POA: Diagnosis not present

## 2018-12-08 DIAGNOSIS — J188 Other pneumonia, unspecified organism: Secondary | ICD-10-CM | POA: Diagnosis not present

## 2018-12-08 DIAGNOSIS — I110 Hypertensive heart disease with heart failure: Secondary | ICD-10-CM | POA: Diagnosis not present

## 2018-12-08 DIAGNOSIS — I5033 Acute on chronic diastolic (congestive) heart failure: Secondary | ICD-10-CM | POA: Diagnosis not present

## 2018-12-08 DIAGNOSIS — J9621 Acute and chronic respiratory failure with hypoxia: Secondary | ICD-10-CM | POA: Diagnosis not present

## 2018-12-14 DIAGNOSIS — J9622 Acute and chronic respiratory failure with hypercapnia: Secondary | ICD-10-CM | POA: Diagnosis not present

## 2018-12-14 DIAGNOSIS — G8929 Other chronic pain: Secondary | ICD-10-CM | POA: Diagnosis not present

## 2018-12-14 DIAGNOSIS — M797 Fibromyalgia: Secondary | ICD-10-CM | POA: Diagnosis not present

## 2018-12-14 DIAGNOSIS — K5790 Diverticulosis of intestine, part unspecified, without perforation or abscess without bleeding: Secondary | ICD-10-CM | POA: Diagnosis not present

## 2018-12-14 DIAGNOSIS — S40922D Unspecified superficial injury of left upper arm, subsequent encounter: Secondary | ICD-10-CM | POA: Diagnosis not present

## 2018-12-14 DIAGNOSIS — D509 Iron deficiency anemia, unspecified: Secondary | ICD-10-CM | POA: Diagnosis not present

## 2018-12-14 DIAGNOSIS — K210 Gastro-esophageal reflux disease with esophagitis: Secondary | ICD-10-CM | POA: Diagnosis not present

## 2018-12-14 DIAGNOSIS — M549 Dorsalgia, unspecified: Secondary | ICD-10-CM | POA: Diagnosis not present

## 2018-12-14 DIAGNOSIS — I480 Paroxysmal atrial fibrillation: Secondary | ICD-10-CM | POA: Diagnosis not present

## 2018-12-14 DIAGNOSIS — M1990 Unspecified osteoarthritis, unspecified site: Secondary | ICD-10-CM | POA: Diagnosis not present

## 2018-12-14 DIAGNOSIS — J441 Chronic obstructive pulmonary disease with (acute) exacerbation: Secondary | ICD-10-CM | POA: Diagnosis not present

## 2018-12-14 DIAGNOSIS — F419 Anxiety disorder, unspecified: Secondary | ICD-10-CM | POA: Diagnosis not present

## 2018-12-14 DIAGNOSIS — I5033 Acute on chronic diastolic (congestive) heart failure: Secondary | ICD-10-CM | POA: Diagnosis not present

## 2018-12-14 DIAGNOSIS — J849 Interstitial pulmonary disease, unspecified: Secondary | ICD-10-CM | POA: Diagnosis not present

## 2018-12-14 DIAGNOSIS — E785 Hyperlipidemia, unspecified: Secondary | ICD-10-CM | POA: Diagnosis not present

## 2018-12-14 DIAGNOSIS — I272 Pulmonary hypertension, unspecified: Secondary | ICD-10-CM | POA: Diagnosis not present

## 2018-12-14 DIAGNOSIS — J440 Chronic obstructive pulmonary disease with acute lower respiratory infection: Secondary | ICD-10-CM | POA: Diagnosis not present

## 2018-12-14 DIAGNOSIS — E782 Mixed hyperlipidemia: Secondary | ICD-10-CM | POA: Diagnosis not present

## 2018-12-14 DIAGNOSIS — J188 Other pneumonia, unspecified organism: Secondary | ICD-10-CM | POA: Diagnosis not present

## 2018-12-14 DIAGNOSIS — M810 Age-related osteoporosis without current pathological fracture: Secondary | ICD-10-CM | POA: Diagnosis not present

## 2018-12-14 DIAGNOSIS — I110 Hypertensive heart disease with heart failure: Secondary | ICD-10-CM | POA: Diagnosis not present

## 2018-12-14 DIAGNOSIS — F329 Major depressive disorder, single episode, unspecified: Secondary | ICD-10-CM | POA: Diagnosis not present

## 2018-12-14 DIAGNOSIS — E1140 Type 2 diabetes mellitus with diabetic neuropathy, unspecified: Secondary | ICD-10-CM | POA: Diagnosis not present

## 2018-12-14 DIAGNOSIS — J9621 Acute and chronic respiratory failure with hypoxia: Secondary | ICD-10-CM | POA: Diagnosis not present

## 2018-12-22 DIAGNOSIS — J441 Chronic obstructive pulmonary disease with (acute) exacerbation: Secondary | ICD-10-CM | POA: Diagnosis not present

## 2018-12-22 DIAGNOSIS — I110 Hypertensive heart disease with heart failure: Secondary | ICD-10-CM | POA: Diagnosis not present

## 2018-12-22 DIAGNOSIS — I5033 Acute on chronic diastolic (congestive) heart failure: Secondary | ICD-10-CM | POA: Diagnosis not present

## 2018-12-22 DIAGNOSIS — J9621 Acute and chronic respiratory failure with hypoxia: Secondary | ICD-10-CM | POA: Diagnosis not present

## 2018-12-22 DIAGNOSIS — J188 Other pneumonia, unspecified organism: Secondary | ICD-10-CM | POA: Diagnosis not present

## 2018-12-22 DIAGNOSIS — J9622 Acute and chronic respiratory failure with hypercapnia: Secondary | ICD-10-CM | POA: Diagnosis not present

## 2018-12-27 ENCOUNTER — Other Ambulatory Visit: Payer: Self-pay

## 2018-12-27 ENCOUNTER — Other Ambulatory Visit: Payer: Medicare Other | Admitting: Licensed Clinical Social Worker

## 2018-12-27 DIAGNOSIS — Z515 Encounter for palliative care: Secondary | ICD-10-CM

## 2018-12-28 ENCOUNTER — Other Ambulatory Visit: Payer: Self-pay

## 2018-12-28 ENCOUNTER — Other Ambulatory Visit: Payer: Medicare Other | Admitting: *Deleted

## 2018-12-28 DIAGNOSIS — I110 Hypertensive heart disease with heart failure: Secondary | ICD-10-CM | POA: Diagnosis not present

## 2018-12-28 DIAGNOSIS — Z515 Encounter for palliative care: Secondary | ICD-10-CM

## 2018-12-28 DIAGNOSIS — J188 Other pneumonia, unspecified organism: Secondary | ICD-10-CM | POA: Diagnosis not present

## 2018-12-28 DIAGNOSIS — J9622 Acute and chronic respiratory failure with hypercapnia: Secondary | ICD-10-CM | POA: Diagnosis not present

## 2018-12-28 DIAGNOSIS — J441 Chronic obstructive pulmonary disease with (acute) exacerbation: Secondary | ICD-10-CM | POA: Diagnosis not present

## 2018-12-28 DIAGNOSIS — I5033 Acute on chronic diastolic (congestive) heart failure: Secondary | ICD-10-CM | POA: Diagnosis not present

## 2018-12-28 DIAGNOSIS — J9621 Acute and chronic respiratory failure with hypoxia: Secondary | ICD-10-CM | POA: Diagnosis not present

## 2018-12-28 NOTE — Progress Notes (Signed)
COMMUNITY PALLIATIVE CARE SW NOTE  PATIENT NAME: Destiny Shelton DOB: Jun 05, 1945 MRN: [id number]  PRIMARY CARE PROVIDER: Marton Redwood, MD  RESPONSIBLE PARTY:  Acct ID - Guarantor Home Phone Work Phone Relationship Acct Type  [id number] - [place]* [phone]  Self P/F     [address] , [place], [place] [address]   Due to the COVID-19 crisis, this virtual check-in visit was done via telephone from my office and it was initiated and consent given by this patient and or family.   PLAN OF CARE and INTERVENTIONS:             1. GOALS OF CARE/ ADVANCE CARE PLANNING:  Goal is for patient to remain in her home.  Patient is a DNR. 2. SOCIAL/EMOTIONAL/SPIRITUAL ASSESSMENT/ INTERVENTIONS:  SW conducted a virtual check-in visit with patient in her home.  She denied pain and stated she was doing "good".  She had no concerns during this visit.  She is eating and sleeping well.  Patient spends most of her days watching television. 3. PATIENT/CAREGIVER EDUCATION/ COPING:  Patient copes by minimally engaging with others. 4. PERSONAL EMERGENCY PLAN:  Family will contact EMS. 5. COMMUNITY RESOURCES COORDINATION/ HEALTH CARE NAVIGATION:  A [facility] RN visits patient one time a week. 6. FINANCIAL/LEGAL CONCERNS/INTERVENTIONS:  None.     SOCIAL HX:  Social History   Tobacco Use  . Smoking status: Former Smoker    Packs/day: 2.00    Years: 30.00    Pack years: 60.00    Types: Cigarettes    Quit date: 06/02/1994    Years since quitting: 24.5  . Smokeless tobacco: Never Used  Substance Use Topics  . Alcohol use: No    CODE STATUS:  DNR ADVANCED DIRECTIVES: N MOST FORM COMPLETE:  N HOSPICE EDUCATION PROVIDED: N PPS:  Duration of visit and documentation:  30 minutes.      Creola Corn Casimiro Lienhard, LCSW

## 2018-12-29 ENCOUNTER — Telehealth: Payer: Self-pay | Admitting: Pulmonary Disease

## 2018-12-29 NOTE — Progress Notes (Signed)
Virtual Visit via Telephone Note  I connected with Destiny Shelton on 12/03/2018 at  9:00 AM EDT by telephone and verified that I am speaking with the correct person using two identifiers.  Location: Patient: Home Provider: [facility] - [address], [place], [place], [place] [address]   I discussed the limitations, risks, security and privacy concerns of performing an evaluation and management service by telephone and the availability of in person appointments. I also discussed with the patient that there may be a patient responsible charge related to this service. The patient expressed understanding and agreed to proceed.  Patient consented to consult via telephone: Yes People present and their role in pt care: Pt     History of Present Illness:  73 year old female former smoker followed in our office for COPD, pulmonary hypertension, ILD, chronic respiratory failure, chronic bronchitis and chronic Pseudomonas infection.  Past medical history: CHF, recurrent community-acquired pneumonia, diabetes, anxiety disorder, depression, GERD, paroxysmal atrial fibrillation Smoking history: Former Smoker. Quit 2016. 60 pack years.  Maintenance: Symbicort 160, Spiriva Handihaler, daily azithromycin, prednisone 5 mg daily, inhaled tobramycin every other month Patient of Dr. Lake Bells  Chief complaint: Discolored sputum, cough, questions about nebulized meds    Langley Gauss from [facility] contacted our office regarding the patient's sputum color.  Patient has been on antibiotics and prednisone therapy 2 times recently and 2 weeks after each of the course of sputum changes occur again.  Patient with known chronic Pseudomonas infection.  Home health nurse Langley Gauss was inquiring on 12/29/2018 whether or not patient should be taking her nebulized tobramycin which was prescribed by Dr. Lake Bells in August/2019.  Patient reached via telephone today.  Patient reports she has been adherent to her  Symbicort 160, Spiriva HandiHaler, prednisone 5 mg daily and albuterol nebs.  She reports she uses her albuterol nebs 2-3 times daily.  Sometimes increased to 4 times daily based off symptoms.  Patient was last seen in the hospital in June/2020.  She reports that she will not be going back to the hospital for management anytime soon.  Unfortunately, the patient is confused regarding her inhaled tobramycin as well as her daily azithromycin.  She reports that she has not taken her inhaled tobramycin since May/2020.  She also reports that her azithromycin daily was stopped when she was in the hospital in June/2020.  She has not resumed either of these.  Home health nurse Langley Gauss reported the patient had 2 rounds of antibiotics.  The patient reports that these rounds of antibiotics were Levaquin 1 from Dr. Lake Bells in February/2021 from Dr. Brigitte Pulse her primary care provider in May/2020.  She has not had any recent antibiotics since hospital discharge in June/2020.  Overall, patient reports that she feels like she is at her baseline but is coughing up more mucus.  Unfortunately patient is not using her flutter valve regularly either.  She reports that she is a here or there and a few breaths each time.  Patient with known severe COPD as well as Pseudomonas colonization.  Last sputum culture results were in 2018 and the sputum had intermediate resistance to ciprofloxacin, gentamicin and Levaquin.  Observations/Objective:  06/16/2016- respiratory sputum-Pseudomonas [place]-intermediate resistance to ciprofloxacin, gentamicin and Levaquin  11/11/2018-chest x-ray-no change from previous exam, mild bibasilar opacities are again seen and stable no new focal abnormalities noted  11/08/2018-chest x-ray-bibasilar opacities are identified, opacity in the right base is stable and chronic, the opacity in left base is mildly more prominent  in the interval and could represent increased atelectasis or developing infiltrate  superimposed on a chronic left basilar process  01/25/2018-echocardiogram-LV ejection fraction 55%, unable to estimate PA systolic pressure  09/18/3788-WIOXBDZHG function test- FVC 2.29 (75% predicted), postbronchodilator ratio 66, postbronchodilator FEV1 1.5 (65% predicted), bronchodilator response in FEV1, mid flow reversibility, DLCO 20  07/22/2016-bronchoscopy- interstitial lung disease, BAL was performed, copious mucopurulent secretions were found throughout the tracheobronchial tree  >>>respiratory culture from BAL shows Pseudomonas   Assessment and Plan:  Pulmonary hypertension ([place]) Plan: Patient needs to resume weighing daily Continue diuretics Continue follow-up with cardiology Close follow-up with our office in 2 to 4 weeks  Chronic diastolic CHF (congestive heart failure) ([place]) Plan: Continue diuretics Resume weighing yourself daily Close follow-up with cardiology Follow-up with our office in 2 to 4 weeks Likely needs lab work, chest x-ray and EKG at next office visit  Centrilobular emphysema ([place]) Plan: Continue Symbicort 160 Continue Spiriva HandiHaler Continue prednisone 10 mg daily Resume inhaled tobramycin Resume daily azithromycin Close follow-up with our office in 2 to 4 weeks with chest x-ray, EKG and lab work   Chronic respiratory failure ([place]) Plan: Continue oxygen therapy as prescribed  ILD (interstitial lung disease) ([place]) Plan: Continue oxygen therapy as prescribed Resume flutter valve use Close follow-up with our office in 2 to 4 weeks  Therapeutic drug monitoring Resume azithromycin daily  Plan: Chest x-ray, EKG, and baseline lab work at next office visit in 2 to 4 weeks  Pseudomonas aeruginosa colonization Plan: Resume tobramycin inhaled nebulized meds twice daily for the month of August, rotate every other month Resume daily azithromycin Restart flutter valve use, twice daily, 10 breaths each time Need to consider sputum culture at  next office visit May need to consider referral to infectious disease if patient continues to have exacerbations despite medication adherence as 2018 sputum grew out intermediate resistance to Levaquin, ciprofloxacin and gentamicin  Medication management Plan: Likely would benefit from pharmacist referral if pharmacist is in office the day of patient's next office visit, this would be helpful to review nebulized meds, daily azithromycin, and inhaler use  Follow Up Instructions:  Return in about 4 weeks (around 01/27/2019), or if symptoms worsen or fail to improve, for Follow up with Wyn Quaker FNP-C, Follow up with Tammy Parrett  ANP-BC.   I discussed the assessment and treatment plan with the patient. The patient was provided an opportunity to ask questions and all were answered. The patient agreed with the plan and demonstrated an understanding of the instructions.   The patient was advised to call back or seek an in-person evaluation if the symptoms worsen or if the condition fails to improve as anticipated.  I provided 29 minutes of non-face-to-face time during this encounter.   Lauraine Rinne, NP

## 2018-12-29 NOTE — Telephone Encounter (Signed)
Called and spoke with Langley Gauss from [facility]. Langley Gauss states within a month pt has had sputum change from white to dark yellow. Pt has been on abx & prednisone therapy x2, however, two weeks after each course, the sputum changes return.   Langley Gauss states pt was inquiring if she should take her tobramycin nebulizer that she had leftover. Tobramycin was prescribed by BQ 01/20/2018 300 mL x5 refills to be taken every other month.  Langley Gauss states she would like for [facility] to call pt and discuss these matters with her. I let Langley Gauss know I would give pt a call and set up a telephonic visit with one of our nurse practitioners, as Dr. Lake Bells is not in the office. Denise expressed understanding.   Called & spoke w/ pt. Pt agreed to setting up a telephone visit with BPM (I would have scheduled her with TP, but TP does not have a schedule for this rest of this week) tomorrow 12/10/2018 at 9:00 AM to discuss her symptoms and the use of the tobramycin nebulizer.   Appt has been scheduled. Routing to BPM as an Pharmacist, hospital. Nothing further needed at this time.

## 2018-12-29 NOTE — Progress Notes (Signed)
COMMUNITY PALLIATIVE CARE RN NOTE  PATIENT NAME: Destiny Shelton DOB: 04-10-1946 MRN: [id number]  PRIMARY CARE PROVIDER: Marton Redwood, MD  RESPONSIBLE PARTY:  Acct ID - Guarantor Home Phone Work Phone Relationship Acct Type  [id number] - [place]* [phone]  Self P/F     [address] , [place], [place] [address]   Due to the COVID-19 crisis, this virtual check-in visit was done via telephone from my office and it was initiated and consent by this patient and or family.  PLAN OF CARE and INTERVENTION:  1. ADVANCE CARE PLANNING/GOALS OF CARE: Goal is to remain at home with her husband. 2. PATIENT/CAREGIVER EDUCATION: Respiratory symptom management, Pain Management 3. DISEASE STATUS: Virtual check-in visit via telephone with patient. She is alert and oriented and able to engage in appropriate conversation. She reports chronic pain in her back and shoulders, but her current pain medication regimen is effective. She says she is becoming weaker and unable to walk as well and requires frequent rest periods between activities. She requires 1 person assistance with bathing and dressing from her daughter or husband. She is able to feed herself. She continues on Oxygen at 6L/min via [place]. She says in the am, her breathing seems to be the worse, but improves as the day progresses. Her O2 sats will drop to around 80% if she coughs or talks a lot. She has a productive cough. She says sputum is greenish in appearance. She is afebrile. She takes Tessalon perles which is effective for her cough. She continues with a home Doctor, general practice. She says she had a recent increase in her Prednisone which has helped her breathing. She remains agreeable to future Palliative Care visits. Will continue to monitor.    HISTORY OF PRESENT ILLNESS:  This is a 73 yo female who resides at home with her husband. Palliative Care team continues to follow patient. Will continue to visit monthly and PRN.  CODE STATUS: DNR ADVANCED  DIRECTIVES: N MOST FORM: no PPS: 40%   (Duration of visit and documentation 30 minutes)   Daryl Eastern, RN BSN

## 2018-12-29 NOTE — Telephone Encounter (Signed)
Noted.   Destiny Shelton

## 2018-12-30 ENCOUNTER — Encounter (HOSPITAL_COMMUNITY): Payer: Self-pay | Admitting: Emergency Medicine

## 2018-12-30 ENCOUNTER — Emergency Department (HOSPITAL_COMMUNITY): Payer: Medicare Other

## 2018-12-30 ENCOUNTER — Encounter: Payer: Self-pay | Admitting: Pulmonary Disease

## 2018-12-30 ENCOUNTER — Ambulatory Visit (INDEPENDENT_AMBULATORY_CARE_PROVIDER_SITE_OTHER): Payer: Medicare Other | Admitting: Pulmonary Disease

## 2018-12-30 ENCOUNTER — Inpatient Hospital Stay (HOSPITAL_COMMUNITY)
Admission: EM | Admit: 2018-12-30 | Discharge: 2019-01-01 | DRG: 871 | Disposition: E | Payer: Medicare Other | Attending: Critical Care Medicine | Admitting: Critical Care Medicine

## 2018-12-30 ENCOUNTER — Other Ambulatory Visit: Payer: Self-pay

## 2018-12-30 DIAGNOSIS — I272 Pulmonary hypertension, unspecified: Secondary | ICD-10-CM

## 2018-12-30 DIAGNOSIS — M810 Age-related osteoporosis without current pathological fracture: Secondary | ICD-10-CM | POA: Diagnosis present

## 2018-12-30 DIAGNOSIS — J9622 Acute and chronic respiratory failure with hypercapnia: Secondary | ICD-10-CM | POA: Diagnosis present

## 2018-12-30 DIAGNOSIS — Z886 Allergy status to analgesic agent status: Secondary | ICD-10-CM

## 2018-12-30 DIAGNOSIS — Z2239 Carrier of other specified bacterial diseases: Secondary | ICD-10-CM | POA: Diagnosis not present

## 2018-12-30 DIAGNOSIS — J439 Emphysema, unspecified: Secondary | ICD-10-CM

## 2018-12-30 DIAGNOSIS — J849 Interstitial pulmonary disease, unspecified: Secondary | ICD-10-CM | POA: Diagnosis not present

## 2018-12-30 DIAGNOSIS — J9811 Atelectasis: Secondary | ICD-10-CM | POA: Diagnosis not present

## 2018-12-30 DIAGNOSIS — R7401 Elevation of levels of liver transaminase levels: Secondary | ICD-10-CM | POA: Diagnosis present

## 2018-12-30 DIAGNOSIS — R4182 Altered mental status, unspecified: Secondary | ICD-10-CM | POA: Diagnosis not present

## 2018-12-30 DIAGNOSIS — J9621 Acute and chronic respiratory failure with hypoxia: Secondary | ICD-10-CM | POA: Diagnosis not present

## 2018-12-30 DIAGNOSIS — I2729 Other secondary pulmonary hypertension: Secondary | ICD-10-CM | POA: Diagnosis present

## 2018-12-30 DIAGNOSIS — Z87442 Personal history of urinary calculi: Secondary | ICD-10-CM

## 2018-12-30 DIAGNOSIS — F419 Anxiety disorder, unspecified: Secondary | ICD-10-CM | POA: Diagnosis present

## 2018-12-30 DIAGNOSIS — R570 Cardiogenic shock: Secondary | ICD-10-CM | POA: Diagnosis present

## 2018-12-30 DIAGNOSIS — J432 Centrilobular emphysema: Secondary | ICD-10-CM | POA: Diagnosis not present

## 2018-12-30 DIAGNOSIS — Z66 Do not resuscitate: Secondary | ICD-10-CM | POA: Diagnosis present

## 2018-12-30 DIAGNOSIS — I5032 Chronic diastolic (congestive) heart failure: Secondary | ICD-10-CM | POA: Diagnosis present

## 2018-12-30 DIAGNOSIS — E8729 Other acidosis: Secondary | ICD-10-CM | POA: Diagnosis present

## 2018-12-30 DIAGNOSIS — I10 Essential (primary) hypertension: Secondary | ICD-10-CM | POA: Diagnosis present

## 2018-12-30 DIAGNOSIS — Z87891 Personal history of nicotine dependence: Secondary | ICD-10-CM

## 2018-12-30 DIAGNOSIS — N179 Acute kidney failure, unspecified: Secondary | ICD-10-CM | POA: Diagnosis present

## 2018-12-30 DIAGNOSIS — A419 Sepsis, unspecified organism: Principal | ICD-10-CM | POA: Diagnosis present

## 2018-12-30 DIAGNOSIS — E1149 Type 2 diabetes mellitus with other diabetic neurological complication: Secondary | ICD-10-CM | POA: Diagnosis present

## 2018-12-30 DIAGNOSIS — R52 Pain, unspecified: Secondary | ICD-10-CM | POA: Diagnosis not present

## 2018-12-30 DIAGNOSIS — Z5181 Encounter for therapeutic drug level monitoring: Secondary | ICD-10-CM

## 2018-12-30 DIAGNOSIS — Z978 Presence of other specified devices: Secondary | ICD-10-CM | POA: Diagnosis not present

## 2018-12-30 DIAGNOSIS — Z8371 Family history of colonic polyps: Secondary | ICD-10-CM

## 2018-12-30 DIAGNOSIS — J449 Chronic obstructive pulmonary disease, unspecified: Secondary | ICD-10-CM

## 2018-12-30 DIAGNOSIS — K219 Gastro-esophageal reflux disease without esophagitis: Secondary | ICD-10-CM | POA: Diagnosis present

## 2018-12-30 DIAGNOSIS — I2609 Other pulmonary embolism with acute cor pulmonale: Secondary | ICD-10-CM | POA: Diagnosis not present

## 2018-12-30 DIAGNOSIS — E875 Hyperkalemia: Secondary | ICD-10-CM | POA: Diagnosis present

## 2018-12-30 DIAGNOSIS — Z20828 Contact with and (suspected) exposure to other viral communicable diseases: Secondary | ICD-10-CM | POA: Diagnosis present

## 2018-12-30 DIAGNOSIS — R0602 Shortness of breath: Secondary | ICD-10-CM | POA: Diagnosis not present

## 2018-12-30 DIAGNOSIS — K761 Chronic passive congestion of liver: Secondary | ICD-10-CM | POA: Diagnosis present

## 2018-12-30 DIAGNOSIS — R40243 Glasgow coma scale score 3-8, unspecified time: Secondary | ICD-10-CM | POA: Diagnosis present

## 2018-12-30 DIAGNOSIS — Z794 Long term (current) use of insulin: Secondary | ICD-10-CM

## 2018-12-30 DIAGNOSIS — R6521 Severe sepsis with septic shock: Secondary | ICD-10-CM | POA: Diagnosis present

## 2018-12-30 DIAGNOSIS — I959 Hypotension, unspecified: Secondary | ICD-10-CM

## 2018-12-30 DIAGNOSIS — Z8249 Family history of ischemic heart disease and other diseases of the circulatory system: Secondary | ICD-10-CM

## 2018-12-30 DIAGNOSIS — I480 Paroxysmal atrial fibrillation: Secondary | ICD-10-CM | POA: Diagnosis present

## 2018-12-30 DIAGNOSIS — G9340 Encephalopathy, unspecified: Secondary | ICD-10-CM

## 2018-12-30 DIAGNOSIS — Z7401 Bed confinement status: Secondary | ICD-10-CM

## 2018-12-30 DIAGNOSIS — J80 Acute respiratory distress syndrome: Secondary | ICD-10-CM | POA: Diagnosis not present

## 2018-12-30 DIAGNOSIS — Z86010 Personal history of colonic polyps: Secondary | ICD-10-CM

## 2018-12-30 DIAGNOSIS — J9611 Chronic respiratory failure with hypoxia: Secondary | ICD-10-CM

## 2018-12-30 DIAGNOSIS — J9602 Acute respiratory failure with hypercapnia: Secondary | ICD-10-CM

## 2018-12-30 DIAGNOSIS — R0902 Hypoxemia: Secondary | ICD-10-CM | POA: Diagnosis not present

## 2018-12-30 DIAGNOSIS — I110 Hypertensive heart disease with heart failure: Secondary | ICD-10-CM | POA: Diagnosis present

## 2018-12-30 DIAGNOSIS — Z8701 Personal history of pneumonia (recurrent): Secondary | ICD-10-CM

## 2018-12-30 DIAGNOSIS — E119 Type 2 diabetes mellitus without complications: Secondary | ICD-10-CM

## 2018-12-30 DIAGNOSIS — Z90710 Acquired absence of both cervix and uterus: Secondary | ICD-10-CM

## 2018-12-30 DIAGNOSIS — E872 Acidosis: Secondary | ICD-10-CM | POA: Diagnosis present

## 2018-12-30 DIAGNOSIS — Z79899 Other long term (current) drug therapy: Secondary | ICD-10-CM

## 2018-12-30 DIAGNOSIS — F329 Major depressive disorder, single episode, unspecified: Secondary | ICD-10-CM | POA: Diagnosis present

## 2018-12-30 DIAGNOSIS — R918 Other nonspecific abnormal finding of lung field: Secondary | ICD-10-CM | POA: Diagnosis not present

## 2018-12-30 DIAGNOSIS — Z823 Family history of stroke: Secondary | ICD-10-CM

## 2018-12-30 DIAGNOSIS — R680 Hypothermia, not associated with low environmental temperature: Secondary | ICD-10-CM | POA: Diagnosis present

## 2018-12-30 DIAGNOSIS — E785 Hyperlipidemia, unspecified: Secondary | ICD-10-CM | POA: Diagnosis present

## 2018-12-30 DIAGNOSIS — D649 Anemia, unspecified: Secondary | ICD-10-CM | POA: Diagnosis not present

## 2018-12-30 DIAGNOSIS — M797 Fibromyalgia: Secondary | ICD-10-CM | POA: Diagnosis present

## 2018-12-30 DIAGNOSIS — Z452 Encounter for adjustment and management of vascular access device: Secondary | ICD-10-CM | POA: Diagnosis not present

## 2018-12-30 DIAGNOSIS — J189 Pneumonia, unspecified organism: Secondary | ICD-10-CM | POA: Diagnosis not present

## 2018-12-30 DIAGNOSIS — Z7951 Long term (current) use of inhaled steroids: Secondary | ICD-10-CM

## 2018-12-30 DIAGNOSIS — J9601 Acute respiratory failure with hypoxia: Secondary | ICD-10-CM

## 2018-12-30 DIAGNOSIS — Z7952 Long term (current) use of systemic steroids: Secondary | ICD-10-CM

## 2018-12-30 DIAGNOSIS — Z6826 Body mass index (BMI) 26.0-26.9, adult: Secondary | ICD-10-CM

## 2018-12-30 DIAGNOSIS — Z808 Family history of malignant neoplasm of other organs or systems: Secondary | ICD-10-CM

## 2018-12-30 DIAGNOSIS — L8990 Pressure ulcer of unspecified site, unspecified stage: Secondary | ICD-10-CM | POA: Diagnosis present

## 2018-12-30 DIAGNOSIS — Z833 Family history of diabetes mellitus: Secondary | ICD-10-CM

## 2018-12-30 DIAGNOSIS — R404 Transient alteration of awareness: Secondary | ICD-10-CM | POA: Diagnosis not present

## 2018-12-30 LAB — BASIC METABOLIC PANEL
Anion gap: 17 — ABNORMAL HIGH (ref 5–15)
BUN: 20 mg/dL (ref 8–23)
CO2: 20 mmol/L — ABNORMAL LOW (ref 22–32)
Calcium: 7.8 mg/dL — ABNORMAL LOW (ref 8.9–10.3)
Chloride: 100 mmol/L (ref 98–111)
Creatinine, Ser: 1.79 mg/dL — ABNORMAL HIGH (ref 0.44–1.00)
GFR calc Af Amer: 32 mL/min — ABNORMAL LOW (ref >60)
GFR calc non Af Amer: 27 mL/min — ABNORMAL LOW (ref >60)
Glucose, Bld: 94 mg/dL (ref 70–99)
Potassium: 6.1 mmol/L — ABNORMAL HIGH (ref 3.5–5.1)
Sodium: 137 mmol/L (ref 135–145)

## 2018-12-30 LAB — POCT I-STAT 7, (LYTES, BLD GAS, ICA,H+H)
Acid-base deficit: 10 mmol/L — ABNORMAL HIGH (ref 0.0–2.0)
Bicarbonate: 18.7 mmol/L — ABNORMAL LOW (ref 20.0–28.0)
Calcium, Ion: 0.96 mmol/L — ABNORMAL LOW (ref 1.15–1.40)
HCT: 32 % — ABNORMAL LOW (ref 36.0–46.0)
Hemoglobin: 10.9 g/dL — ABNORMAL LOW (ref 12.0–15.0)
O2 Saturation: 98 %
Patient temperature: 96.1
Potassium: 6.3 mmol/L (ref 3.5–5.1)
Sodium: 139 mmol/L (ref 135–145)
TCO2: 20 mmol/L — ABNORMAL LOW (ref 22–32)
pCO2 arterial: 50 mmHg — ABNORMAL HIGH (ref 32.0–48.0)
pH, Arterial: 7.173 — CL (ref 7.350–7.450)
pO2, Arterial: 121 mmHg — ABNORMAL HIGH (ref 83.0–108.0)

## 2018-12-30 LAB — CBC
HCT: 38.9 % (ref 36.0–46.0)
Hemoglobin: 9.8 g/dL — ABNORMAL LOW (ref 12.0–15.0)
MCH: 22.3 pg — ABNORMAL LOW (ref 26.0–34.0)
MCHC: 25.2 g/dL — ABNORMAL LOW (ref 30.0–36.0)
MCV: 88.6 fL (ref 80.0–100.0)
Platelets: 381 10*3/uL (ref 150–400)
RBC: 4.39 MIL/uL (ref 3.87–5.11)
RDW: 20.3 % — ABNORMAL HIGH (ref 11.5–15.5)
WBC: 16.5 10*3/uL — ABNORMAL HIGH (ref 4.0–10.5)
nRBC: 3.3 % — ABNORMAL HIGH (ref 0.0–0.2)

## 2018-12-30 LAB — BRAIN NATRIURETIC PEPTIDE: B Natriuretic Peptide: 1163.5 pg/mL — ABNORMAL HIGH (ref 0.0–100.0)

## 2018-12-30 LAB — TRIGLYCERIDES: Triglycerides: 119 mg/dL (ref <150)

## 2018-12-30 LAB — SARS CORONAVIRUS 2 BY RT PCR (HOSPITAL ORDER, PERFORMED IN ~~LOC~~ HOSPITAL LAB): SARS Coronavirus 2: NEGATIVE

## 2018-12-30 MED ORDER — FENTANYL CITRATE (PF) 100 MCG/2ML IJ SOLN
50.0000 ug | INTRAMUSCULAR | Status: DC | PRN
  Administered 2018-12-31: 50 ug via INTRAVENOUS
  Filled 2018-12-30: qty 2

## 2018-12-30 MED ORDER — PROPOFOL 1000 MG/100ML IV EMUL: 0.0000 ug/kg/min | INTRAVENOUS | Status: DC

## 2018-12-30 MED ORDER — CALCIUM GLUCONATE 10 % IV SOLN
1.0000 g | Freq: Once | INTRAVENOUS | Status: AC
  Administered 2018-12-31: 01:00:00 1 g via INTRAVENOUS
  Filled 2018-12-30: qty 10

## 2018-12-30 MED ORDER — ROCURONIUM BROMIDE 50 MG/5ML IV SOLN
100.0000 mg | Freq: Once | INTRAVENOUS | Status: AC
  Administered 2018-12-30: 100 mg via INTRAVENOUS
  Filled 2018-12-30: qty 10

## 2018-12-30 MED ORDER — ALBUTEROL (5 MG/ML) CONTINUOUS INHALATION SOLN
10.0000 mg/h | INHALATION_SOLUTION | Freq: Once | RESPIRATORY_TRACT | Status: DC
  Filled 2018-12-30: qty 20

## 2018-12-30 MED ORDER — FENTANYL CITRATE (PF) 100 MCG/2ML IJ SOLN
50.0000 ug | INTRAMUSCULAR | Status: DC | PRN
  Filled 2018-12-30: qty 2

## 2018-12-30 MED ORDER — SODIUM CHLORIDE 0.9 % IV BOLUS
1000.0000 mL | Freq: Once | INTRAVENOUS | Status: AC
  Administered 2018-12-30: 1000 mL via INTRAVENOUS

## 2018-12-30 MED ORDER — ETOMIDATE 2 MG/ML IV SOLN
20.0000 mg | Freq: Once | INTRAVENOUS | Status: AC
  Administered 2018-12-30: 20 mg via INTRAVENOUS

## 2018-12-30 MED ORDER — MIDAZOLAM HCL 2 MG/2ML IJ SOLN: 1.0000 mg | INTRAMUSCULAR | Status: DC | PRN

## 2018-12-30 MED ORDER — ALBUTEROL SULFATE (2.5 MG/3ML) 0.083% IN NEBU
2.5000 mg | INHALATION_SOLUTION | RESPIRATORY_TRACT | Status: DC
  Administered 2018-12-30: 2.5 mg via RESPIRATORY_TRACT
  Filled 2018-12-30: qty 3

## 2018-12-30 MED ORDER — NOREPINEPHRINE 4 MG/250ML-% IV SOLN
INTRAVENOUS | Status: AC
  Administered 2018-12-30: 22:00:00 5 ug/kg/min
  Filled 2018-12-30: qty 250

## 2018-12-30 MED ORDER — PHENYLEPHRINE 40 MCG/ML (10ML) SYRINGE FOR IV PUSH (FOR BLOOD PRESSURE SUPPORT)
PREFILLED_SYRINGE | INTRAVENOUS | Status: AC
  Filled 2018-12-30: qty 20

## 2018-12-30 MED ORDER — PHENYLEPHRINE 40 MCG/ML (10ML) SYRINGE FOR IV PUSH (FOR BLOOD PRESSURE SUPPORT)
100.0000 ug | PREFILLED_SYRINGE | Freq: Once | INTRAVENOUS | Status: AC
  Administered 2018-12-30: 100 ug via INTRAVENOUS

## 2018-12-30 MED ORDER — PHENYLEPHRINE 40 MCG/ML (10ML) SYRINGE FOR IV PUSH (FOR BLOOD PRESSURE SUPPORT)
100.0000 ug | PREFILLED_SYRINGE | Freq: Once | INTRAVENOUS | Status: AC
  Administered 2018-12-30: 22:00:00 100 ug via INTRAVENOUS

## 2018-12-30 MED ORDER — IPRATROPIUM BROMIDE 0.02 % IN SOLN
0.5000 mg | Freq: Once | RESPIRATORY_TRACT | Status: AC
  Administered 2018-12-30: 0.5 mg via RESPIRATORY_TRACT
  Filled 2018-12-30: qty 2.5

## 2018-12-30 MED ORDER — MIDAZOLAM HCL 2 MG/2ML IJ SOLN
1.0000 mg | INTRAMUSCULAR | Status: DC | PRN
  Administered 2018-12-31: 1 mg via INTRAVENOUS
  Filled 2018-12-30: qty 2

## 2018-12-30 MED ORDER — VANCOMYCIN HCL 10 G IV SOLR
2000.0000 mg | Freq: Once | INTRAVENOUS | Status: AC
  Administered 2018-12-31: 2000 mg via INTRAVENOUS
  Filled 2018-12-30: qty 2000

## 2018-12-30 MED ORDER — SODIUM BICARBONATE 8.4 % IV SOLN
50.0000 meq | Freq: Once | INTRAVENOUS | Status: AC
  Administered 2018-12-31: 01:00:00 50 meq via INTRAVENOUS
  Filled 2018-12-30: qty 50

## 2018-12-30 MED ORDER — SODIUM CHLORIDE 0.9 % IV SOLN
Freq: Once | INTRAVENOUS | Status: AC
  Administered 2018-12-30: 22:00:00 via INTRAVENOUS

## 2018-12-30 MED ORDER — SODIUM CHLORIDE 0.9 % IV SOLN
2.0000 g | Freq: Once | INTRAVENOUS | Status: AC
  Administered 2018-12-31: 2 g via INTRAVENOUS
  Filled 2018-12-30: qty 2

## 2018-12-30 NOTE — ED Notes (Signed)
Resident MD at bedside accessing Arterial line .

## 2018-12-30 NOTE — ED Provider Notes (Signed)
[facility] EMERGENCY DEPARTMENT Provider Note   CSN: [id number] Arrival date & time: 12/17/2018  2134    History   Chief Complaint Chief Complaint  Patient presents with   Shortness of Breath    HPI Destiny Shelton is a 73 y.o. female with history of COPD on 6L [place] at baseline, CHF, DM2, atrial fibrillation, and morbid obesity presents to the ED via EMS complaining of respiratory distress.  Husband reports patient is bedbound at baseline and requires a wheelchair to maneuver around the house.  He reports that she has been more weak than normal over the past few days and was unable to get herself off of the toilet earlier today.  He reports he had family come out to help her off of the toilet earlier and then she became acutely more confused and short of breath this evening so he called EMS.  He reports she has had a cough productive of white and green sputum over the past few days.  He reports she has been admitted for sepsis secondary to pneumonia in the past which required an ICU stay.     The history is provided by the EMS personnel and the spouse. The history is limited by the condition of the patient.    Past Medical History:  Diagnosis Date   Adenomatous colon polyp    ALLERGIC RHINITIS    Anemia    iron deficient   Anxiety    Atrial tachycardia (HCC)    Mostly Sinus Tachycardia   Back pain, chronic    Carotid stenosis    CHF (congestive heart failure) ([place])    Community acquired pneumonia - Recent Admission (07/2013) 07/08/2013   COPD (chronic obstructive pulmonary disease) ([place])    Requiring Home O2 at 4L   Depression    Diabetes mellitus type 2 with neurological manifestations ([place])    On Insulin   Diabetes mellitus without complication ([place])    Diverticulosis of colon    External hemorrhoids    Fibromyalgia    "pain in arms and shoulders."   Gastritis    GERD (gastroesophageal reflux disease)    Headache(784.0)    tension    Hyperlipemia    Hypertension    Inappropriate sinus tachycardia    Morbidly obese (HCC)    Nephrolithiasis    Neuromuscular disorder ([place])    diabetic neuropathy   Osteoarthritis    Osteoporosis    PAF (paroxysmal atrial fibrillation) ([place])    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   Sleep apnea    no cpap machine  02 at 4l/min all the time   Urosepsis 05/26/2012    Patient Active Problem List   Diagnosis Date Noted   Respiratory failure ([place]) 01-09-2019   Therapeutic drug monitoring 12/16/2018   Pseudomonas aeruginosa colonization 12/25/2018   Medication management 12/11/2018   Goals of care, counseling/discussion    Palliative care by specialist    Advanced care planning/counseling discussion    Acute on chronic respiratory failure with hypoxia and hypercapnia ([place]) 11/08/2018   Epistaxis 03/02/2017   Enterococcal bacteremia 01/30/2017   CAP (community acquired pneumonia) 01/29/2017   Sepsis ([place]) 01/29/2017   Bleeding from the nose 01/29/2017   ILD (interstitial lung disease) ([place]) 06/26/2016   Fall    Hyponatremia 93/57/0177   Metabolic acidosis 93/90/3009   COPD (chronic obstructive pulmonary disease) ([place]) 05/25/2016   COPD exacerbation ([place]) 01/22/2016   Biomechanical lesion of rib cage 01/22/2016   Anemia 01/22/2016   Chronic diastolic CHF (  congestive heart failure) ([place]) 12/12/2015   Morbid obesity ([place]) 12/29/2014   Benign neoplasm of sigmoid colon    Chronic diastolic heart failure of unknown etiology ([place]) 08/17/2013   Chronic respiratory failure ([place]) 04/21/2013   Polyposis coli-attenuated 01/24/2013   Hypokalemia 10/02/2012   Sigmoid tubular adenoma with high-grade dysplasia 10/01/2012   Gastric polyp 10/01/2012   Schatzki's ring 10/01/2012   PAF (paroxysmal atrial fibrillation) ([place]) 05/29/2012   Pulmonary hypertension ([place]) 04/15/2012   Inappropriate sinus tachycardia    Obesity, Class II, BMI 35-39.9, with  comorbidity ([place])     Class: Chronic   History of failed moderate sedation 06/10/2011   Pulmonary nodules 09/11/2010   Centrilobular emphysema ([place]) 09/11/2010   CHRONIC DYSPNEA 07/26/2009   Diabetes mellitus, type II, insulin dependent ([place]) 07/29/2007    Class: Diagnosis of   DIABETIC PERIPHERAL NEUROPATHY 07/29/2007   Anxiety state 07/29/2007   Depression 07/29/2007   Essential hypertension 07/29/2007   NEPHROLITHIASIS 07/29/2007   Osteoarthritis 07/29/2007   Backache 07/29/2007   Osteoporosis 07/29/2007   Personal history of colonic polyps 04/28/2007    Past Surgical History:  Procedure Laterality Date   [place] N/A 07/04/2015   Procedure: Right Heart Cath;  Surgeon: Larey Dresser, MD;  Location: [facility];  Service: Cardiovascular;  Laterality: N/A;   CHOLECYSTECTOMY  1963   COLONOSCOPY N/A 12/21/2012   Procedure: COLONOSCOPY;  Surgeon: Gatha Mayer, MD;  Location: [facility];  Service: Endoscopy;  Laterality: N/A;   COLONOSCOPY N/A 11/17/2013   Procedure: COLONOSCOPY;  Surgeon: Gatha Mayer, MD;  Location: [facility];  Service: Endoscopy;  Laterality: N/A;   COLONOSCOPY W/ BIOPSIES AND POLYPECTOMY  07/22/2007, 04/28/2007   2009: diverticulosis, 2008: diverticulosis, adenomatous polyps   COLONOSCOPY WITH PROPOFOL N/A 10/01/2012   Procedure: COLONOSCOPY WITH PROPOFOL;  Surgeon: Gatha Mayer, MD;  [facility]: [facility];  Service: Endoscopy;  Laterality: N/A;   COLONOSCOPY WITH PROPOFOL N/A 10/10/2014   Procedure: COLONOSCOPY WITH PROPOFOL;  Surgeon: Gatha Mayer, MD;  [facility];  Service: Endoscopy;  Laterality: N/A;   ESOPHAGOGASTRODUODENOSCOPY (EGD) WITH PROPOFOL N/A 10/01/2012   Procedure: ESOPHAGOGASTRODUODENOSCOPY (EGD) WITH PROPOFOL;  Surgeon: Gatha Mayer, MD;  [facility];  Service: Endoscopy;  Laterality: N/A;   HOT HEMOSTASIS N/A  12/21/2012   Procedure: HOT HEMOSTASIS (ARGON PLASMA COAGULATION/BICAP);  Surgeon: Gatha Mayer, MD;  Location: [facility];  Service: Endoscopy;  Laterality: N/A;   HOT HEMOSTASIS N/A 10/10/2014   Procedure: HOT HEMOSTASIS (ARGON PLASMA COAGULATION/BICAP);  Surgeon: Gatha Mayer, MD;  Location: [facility];  Service: Endoscopy;  Laterality: N/A;   LEFT AND RIGHT HEART CATHETERIZATION WITH CORONARY ANGIOGRAM  12/19/2011   Procedure: LEFT AND RIGHT HEART CATHETERIZATION WITH CORONARY ANGIOGRAM;  Surgeon: Leonie Man, MD;  Location: [facility];  Service: Cardiovascular;;   RIGHT AND LEFT CARDIAC CATHETERIZATION  July 2013   RAP: 22 mmHg, and RVP: 46/16 mmHg, RV EDP 18; L. BP 110/11 mmHg, LVEDP 20 mmHg;  PAP 45/29 mmHg (mean 37 mmHg); PCWP 29/22 mmHg - 26 mmHg; no significant coronary disease   TEE WITHOUT CARDIOVERSION  03/24/2012   Procedure: TRANSESOPHAGEAL ECHOCARDIOGRAM (TEE);  Surgeon: Pixie Casino, MD;  Location: [facility];  Service: Cardiovascular;  Laterality: N/A;  TEE with Bubble   TOTAL KNEE ARTHROPLASTY  1997   right   UPPER GASTROINTESTINAL ENDOSCOPY  04/09/2006   w/Dilation, gastritis   VIDEO  BRONCHOSCOPY Bilateral 07/22/2016   Procedure: VIDEO BRONCHOSCOPY WITH FLUORO;  Surgeon: Juanito Doom, MD;  Location: [facility];  Service: Cardiopulmonary;  Laterality: Bilateral;     OB History   No obstetric history on file.      Home Medications    Prior to Admission medications   Medication Sig Start Date End Date Taking? Authorizing Provider  acetaminophen (TYLENOL) 500 MG tablet Take 1 tablet (500 mg total) by mouth every 6 (six) hours as needed for mild pain or headache. 02/01/17   Arrien, Jimmy Picket, MD  albuterol (PROVENTIL) (2.5 MG/3ML) 0.083% nebulizer solution USE 1 VIAL VIA NEBULIZER EVERY 6 HOURS AS NEEDED FOR WHEEZING OR SHORTNESS OF BREATH Patient taking differently: Take 2.5 mg by nebulization every 6 (six) hours as needed for wheezing or  shortness of breath.  05/03/18   Juanito Doom, MD  ALPRAZolam Duanne Moron) 0.5 MG tablet Take 0.25-0.5 mg by mouth daily as needed for anxiety.  02/06/17   [provider]  atorvastatin (LIPITOR) 40 MG tablet Take 40 mg by mouth at bedtime.  06/06/13   [provider]  benzonatate (TESSALON) 200 MG capsule Take 600 mg by mouth at bedtime.     [provider]  buPROPion (WELLBUTRIN XL) 300 MG 24 hr tablet Take 300 mg by mouth daily. 03/03/16   [provider]  DALIRESP 500 MCG TABS tablet Take 500 mcg by mouth daily. 09/03/18   [provider]  DULoxetine (CYMBALTA) 60 MG capsule Take 60 mg by mouth daily.     [provider]  gabapentin (NEURONTIN) 600 MG tablet Take 600 mg by mouth 3 (three) times daily.     [provider]  Guaifenesin (MUCINEX MAXIMUM STRENGTH) 1200 MG TB12 Take 1,200 mg by mouth 2 (two) times daily as needed (for cough or congestion).     [provider]  HYDROcodone-acetaminophen (NORCO/VICODIN) 5-325 MG per tablet Take 1 tablet by mouth See admin instructions. EVERY 4-6 HOURS AS NEEDED FOR PAIN OR SHORTNESS OF BREATH    [provider]  insulin aspart (NOVOLOG FLEXPEN) 100 UNIT/ML FlexPen Inject 5 Units into the skin 3 (three) times daily as needed for high blood sugar.    [provider]  insulin glargine (LANTUS) 100 UNIT/ML injection Inject 0.66 mLs (66 Units total) into the skin daily before breakfast. Patient taking differently: Inject 64 Units into the skin daily before breakfast.  01/24/16   Janece Canterbury, MD  iron polysaccharides (NIFEREX) 150 MG capsule Take 1 capsule (150 mg total) by mouth 2 (two) times daily. Patient taking differently: Take 150 mg by mouth daily.  01/24/16   Janece Canterbury, MD  levalbuterol [facility] HFA) 45 MCG/ACT inhaler Inhale 1-2 puffs into the lungs every 4 (four) hours as needed for wheezing. Patient taking differently: Inhale 2 puffs into the lungs every 4  (four) hours as needed for wheezing or shortness of breath.  06/01/12   Marton [place]wood, MD  Multiple Vitamin (MULTIVITAMIN WITH MINERALS) TABS tablet Take 1 tablet by mouth daily.    [provider]  omeprazole (PRILOSEC OTC) 20 MG tablet Take 20 mg by mouth daily.     [provider]  OXYGEN Inhale 5-8 L into the lungs See admin instructions. 5 liters when at rest and 8 liters when ambulatory    [provider]  polyethylene glycol (MIRALAX / GLYCOLAX) packet Take 17 g by mouth daily. Mix in 8 oz liquid and drink    [provider]  polyvinyl alcohol (ARTIFICIAL TEARS) 1.4 % ophthalmic solution Place 1 drop into both eyes 2 (two) times daily.     [provider]  potassium chloride (K-DUR) 10 MEQ tablet TAKE 3 TABLETS BY MOUTH TWICE DAILY. Patient taking differently: Take 30 mEq by mouth daily.  10/04/18   Larey Dresser, MD  SPIRIVA HANDIHALER 18 MCG inhalation capsule INHALE THE CONTENTS OF 1 CAPSULE VIA HANDIHALER BY MOUTH EVERY MORNING Patient taking differently: Place 18 mcg into inhaler and inhale daily.  08/05/18   Juanito Doom, MD  SYMBICORT 160-4.5 MCG/ACT inhaler INHALE 2 PUFFS BY MOUTH TWICE DAILY Patient taking differently: Inhale 2 puffs into the lungs 2 (two) times a day.  07/16/18   Juanito Doom, MD  tobramycin, PF, (TOBI) 300 MG/5ML nebulizer solution Take 5 mLs (300 mg total) by nebulization 2 (two) times daily. Every other month DX: J44.9 01/20/18   Juanito Doom, MD  tolterodine (DETROL LA) 4 MG 24 hr capsule Take 4 mg by mouth daily as needed. Overactive bladder 08/06/18   [provider]  torsemide (DEMADEX) 20 MG tablet Take 80 mg (4 tabs) in am and 40 mg (2 tabs) in pm Patient taking differently: 40-80 mg See admin instructions. Take 80 mg (4 tabs) in am and 40 mg (2 tabs) in pm 01/11/18   Larey Dresser, MD  verapamil (VERELAN PM) 360 MG 24 hr capsule TAKE 1 CAPSULE(360 MG) BY MOUTH DAILY Patient taking  differently: Take 360 mg by mouth daily.  09/02/18   Larey Dresser, MD  XARELTO 20 MG TABS tablet Take 20 mg by mouth daily with supper. 02/22/17   [provider]    Family History Family History  Problem Relation Age of Onset   Heart disease Mother    Stroke Mother    Heart disease Father    Heart disease Brother    Stroke Brother    Skin cancer Brother    Colon polyps Sister    Diabetes Sister    Diabetes Brother    Irritable bowel syndrome Daughter    Colon cancer Neg Hx     Social History Social History   Tobacco Use   Smoking status: Former Smoker    Packs/day: 2.00    Years: 30.00    Pack years: 60.00    Types: Cigarettes    Quit date: 06/02/1994    Years since quitting: 24.5   Smokeless tobacco: Never Used  Substance Use Topics   Alcohol use: No   Drug use: No     Allergies   Aspirin and Nsaids   Review of Systems Review of Systems  Unable to perform ROS: Patient unresponsive  Respiratory: Positive for cough and shortness of breath.   Neurological: Positive for weakness.     Physical Exam Updated Vital Signs BP (!) 73/42    Pulse 66    Temp (!) 96.1 F (35.6 C) (Temporal)    Resp 20    Ht 6' (1.829 m)    Wt 90 kg    SpO2 (!) 14%    BMI 26.91 kg/m   Physical Exam Constitutional:      General: She is in acute distress.     Appearance: She is ill-appearing and toxic-appearing.     Comments: Unresponsive.  Moaning.  HENT:     Head: Normocephalic and atraumatic.     Nose: Nose normal. No congestion or rhinorrhea.     Mouth/Throat:     Mouth: Mucous membranes  are dry.     Pharynx: Oropharynx is clear.  Eyes:     Conjunctiva/sclera: Conjunctivae normal.     Comments: Pupils are 3 mm and sluggish bilaterally.  Patient does not track examiner.  Neck:     Musculoskeletal: Normal range of motion and neck supple. No neck rigidity.  Cardiovascular:     Rate and Rhythm: Normal rate and regular rhythm.     Pulses: Normal pulses.      Heart sounds: Normal heart sounds. No murmur. No friction rub. No gallop.   Pulmonary:     Effort: Respiratory distress present.     Comments: Breath sounds are diminished in bilateral bases.  Poor air movement.  Breath sounds present superiorly.  Patient satting in the upper 80s on nonrebreather. Abdominal:     General: Abdomen is flat. There is no distension.     Palpations: Abdomen is soft.  Musculoskeletal:        General: No swelling, tenderness, deformity or signs of injury.  Skin:    General: Skin is dry.     Comments: Extremities are cool.  Neurological:     GCS: GCS eye subscore is 1. GCS verbal subscore is 2. GCS motor subscore is 1.     Comments: Patient is unresponsive.  She does not open her eyes to painful stimuli or withdraw to pain in all 4 extremities.  Pupils are 3 mm and sluggish bilaterally.      ED Treatments / Results  Labs (all labs ordered are listed, but only abnormal results are displayed) Labs Reviewed  BASIC METABOLIC PANEL - Abnormal; Notable for the following components:      Result Value   Potassium 6.1 (*)    CO2 20 (*)    Creatinine, Ser 1.79 (*)    Calcium 7.8 (*)    GFR calc non Af Amer 27 (*)    GFR calc Af Amer 32 (*)    Anion gap 17 (*)    All other components within normal limits  CBC - Abnormal; Notable for the following components:   WBC 16.5 (*)    Hemoglobin 9.8 (*)    MCH 22.3 (*)    MCHC 25.2 (*)    RDW 20.3 (*)    nRBC 3.3 (*)    All other components within normal limits  BRAIN NATRIURETIC PEPTIDE - Abnormal; Notable for the following components:   B Natriuretic Peptide 1,163.5 (*)    All other components within normal limits  POCT I-STAT 7, (LYTES, BLD GAS, ICA,H+H) - Abnormal; Notable for the following components:   pH, Arterial 7.173 (*)    pCO2 arterial 50.0 (*)    pO2, Arterial 121.0 (*)    Bicarbonate 18.7 (*)    TCO2 20 (*)    Acid-base deficit 10.0 (*)    Potassium 6.3 (*)    Calcium, Ion 0.96 (*)    HCT  32.0 (*)    Hemoglobin 10.9 (*)    All other components within normal limits  SARS CORONAVIRUS 2 (HOSPITAL ORDER, [facility])  CULTURE, BLOOD (ROUTINE X 2)  CULTURE, BLOOD (ROUTINE X 2)  URINE CULTURE  CULTURE, RESPIRATORY  TRIGLYCERIDES  LACTIC ACID, PLASMA  LACTIC ACID, PLASMA  APTT  PROTIME-INR  URINALYSIS, ROUTINE W REFLEX MICROSCOPIC  MAGNESIUM  PHOSPHORUS  PROCALCITONIN  LEGIONELLA PNEUMOPHILA SEROGP 1 UR AG  STREP PNEUMONIAE URINARY ANTIGEN  CBC  BASIC METABOLIC PANEL  BLOOD GAS, ARTERIAL  MAGNESIUM  PHOSPHORUS  TROPONIN I (  HIGH SENSITIVITY)    EKG None  Radiology Dg Chest Portable 1 View  Result Date: 12/25/2018 CLINICAL DATA:  Central line placement EXAM: PORTABLE CHEST 1 VIEW COMPARISON:  12/18/2018 FINDINGS: Endotracheal tube terminates 3.8 cm above the carina. Left lung base is mildly obscured, likely reflecting combination of atelectasis and small left pleural effusion. Mild increased interstitial markings. Mild right basilar atelectasis. No pneumothorax. Right IJ venous catheter terminates at the cavoatrial junction. Enteric tube courses into the stomach. IMPRESSION: Right IJ venous catheter terminates at the cavoatrial junction. Additional support apparatus as above. Left lower lobe opacity, likely reflecting a combination of atelectasis and small left pleural effusion. Mild right basilar atelectasis. Electronically Signed   By: Julian Hy M.D.   On: 12/28/2018 23:46   Dg Chest Portable 1 View  Result Date: 12/13/2018 CLINICAL DATA:  Shortness of breath EXAM: PORTABLE CHEST 1 VIEW COMPARISON:  November 11, 2018 FINDINGS: Endotracheal tube is seen with the tip 1.4 cm above the level of the carina. The NG tube is seen coursing below the level of the diaphragm. Cardiomegaly is seen. Again noted are bibasilar opacities, slightly worsened on the right side than the prior exam. There is also mildly increased interstitial markings seen within  the upper lungs. No large airspace consolidation. There is atherosclerotic calcification the aortic knob with a tortuous descending aorta. IMPRESSION: 1. ET tube and NG tube in appropriate position. 2. Mildly increased bibasilar opacities which could be due to atelectasis and/or early infectious etiology. Electronically Signed   By: Prudencio Pair M.D.   On: 12/09/2018 22:25    Procedures Procedure Name: Intubation Date/Time: 12/15/2018 10:01 PM Performed by: Candie Chroman, MD Pre-anesthesia Checklist: Patient identified, Emergency Drugs available, Suction available, Patient being monitored and Timeout performed Oxygen Delivery Method: Non-rebreather mask Preoxygenation: Pre-oxygenation with 100% oxygen Induction Type: IV induction and Rapid sequence Laryngoscope Size: Glidescope and 3 Grade View: Grade I Tube size: 7.5 mm Number of attempts: 1 Airway Equipment and Method: Video-laryngoscopy Placement Confirmation: ETT inserted through vocal cords under direct vision,  Positive ETCO2,  CO2 detector and Breath sounds checked- equal and bilateral Secured at: 24 (at teeth) cm    .Central Line  Date/Time: 12/13/2018 10:51 PM Performed by: Candie Chroman, MD Authorized by: Varney Biles, MD   Consent:    Consent obtained:  Verbal   Consent given by:  Spouse   Risks discussed:  Arterial puncture, incorrect placement, bleeding, infection and pneumothorax   Alternatives discussed:  No treatment Pre-procedure details:    Hand hygiene: Hand hygiene performed prior to insertion     Sterile barrier technique: All elements of maximal sterile technique followed     Skin preparation:  ChloraPrep   Skin preparation agent: Skin preparation agent completely dried prior to procedure   Sedation:    Sedation type:  Deep Anesthesia (see MAR for exact dosages):    Anesthesia method:  None Procedure details:    Location:  R internal jugular   Patient position:  Trendelenburg   Procedural supplies:   Triple lumen   Catheter size:  7 Fr   Landmarks identified: yes     Ultrasound guidance: yes     Sterile ultrasound techniques: Sterile gel and sterile probe covers were used     Number of attempts:  1   Successful placement: yes   Post-procedure details:    Post-procedure:  Dressing applied and line sutured   Assessment:  Blood return through all ports and free fluid flow   Patient  tolerance of procedure:  Tolerated well, no immediate complications ARTERIAL LINE  Date/Time: 12/22/2018 10:31 PM Performed by: Candie Chroman, MD Authorized by: Varney Biles, MD   Consent:    Consent obtained:  Emergent situation Universal protocol:    Patient identity confirmed:  Arm band and hospital-assigned identification number Indications:    Indications: hemodynamic monitoring and multiple ABGs   Pre-procedure details:    Skin preparation:  2% Chlorhexidine Sedation:    Sedation type:  Deep Procedure details:    Location:  L femoral   Needle gauge:  20 G   Placement technique:  Ultrasound guided and Seldinger   Number of attempts:  2   Transducer: waveform confirmed   Post-procedure details:    Post-procedure:  Biopatch applied, sterile dressing applied and sutured   Patient tolerance of procedure:  Tolerated well, no immediate complications   (including critical care time)  Medications Ordered in ED Medications  fentaNYL (SUBLIMAZE) injection 50 mcg (has no administration in time range)  fentaNYL (SUBLIMAZE) injection 50 mcg (has no administration in time range)  midazolam (VERSED) injection 1 mg (has no administration in time range)  midazolam (VERSED) injection 1 mg (has no administration in time range)  albuterol (PROVENTIL,VENTOLIN) solution continuous neb (10 mg/hr Nebulization Not Given 12/09/2018 2325)  calcium gluconate inj 10% (1 g) URGENT USE ONLY! (has no administration in time range)  sodium bicarbonate injection 50 mEq (has no administration in time range)  albuterol  (PROVENTIL) (2.5 MG/3ML) 0.083% nebulizer solution 2.5 mg (2.5 mg Nebulization Given 12/15/2018 2349)  vancomycin (VANCOCIN) 2,000 mg in sodium chloride 0.9 % 500 mL IVPB (has no administration in time range)  ceFEPIme (MAXIPIME) 2 g in sodium chloride 0.9 % 100 mL IVPB (has no administration in time range)  etomidate (AMIDATE) injection 20 mg (20 mg Intravenous Given 12/27/2018 2149)  rocuronium (ZEMURON) injection 100 mg (100 mg Intravenous Given 12/24/2018 2149)  0.9 %  sodium chloride infusion ( Intravenous Stopped 12/15/2018 2335)  norepinephrine (LEVOPHED) 4-5 MG/250ML-% infusion SOLN (30 mcg/min  Rate/Dose Change 12/14/2018 2352)  PHENYLephrine 40 mcg/ml in normal saline Adult IV Push Syringe (100 mcg Intravenous Given 12/25/2018 2210)  PHENYLephrine 40 mcg/ml in normal saline Adult IV Push Syringe (100 mcg Intravenous Given 12/11/2018 2213)  sodium chloride 0.9 % bolus 1,000 mL (1,000 mLs Intravenous New Bag/Given 12/01/2018 2214)  PHENYLephrine 40 mcg/ml in normal saline Adult IV Push Syringe (100 mcg Intravenous Given 12/09/2018 2224)  ipratropium (ATROVENT) nebulizer solution 0.5 mg (0.5 mg Nebulization Given 12/01/2018 2349)  sodium chloride 0.9 % bolus 1,000 mL (1,000 mLs Intravenous New Bag/Given 12/30/18 2345)     Initial Impression / Assessment and Plan / ED Course  I have reviewed the triage vital signs and the nursing notes.  Pertinent labs & imaging results that were available during my care of the patient were reviewed by me and considered in my medical decision making (see chart for details).        Destiny Shelton is a 73 y.o. female with history of COPD on 6L [place] at baseline, CHF, DM2, atrial fibrillation, and morbid obesity presents to the ED via EMS complaining of respiratory distress, cough, and worsening generalized weakness.  On arrival to the ED, patient's GCS was 4 and she was satting in the upper 80s on a nonrebreather.  She was hemodynamically stable on arrival.  Patient was promptly  intubated via RSI with etomidate and rocuronium using video laryngoscopy with a 7.5 ET tube.  Following intubation, patient  became hypotensive with manual blood pressures 30 over palp.  Patient did not lose pulses.  Patient was given 3 pushes of 100 mcg of Neo-Synephrine while an arterial line was being placed.  An arterial line was placed in the left femoral artery under ultrasound guidance, which showed blood pressures of 60s over 40s.  Levophed was started peripherally at 10 mcg/min with improvement of arterial line pressures.  A central line was then placed in the right IJ under ultrasound guidance.  Chest x-rays were obtained which showed adequate placement of the endotracheal tube and central line.  Patient was noted to have cardiomegaly and bibasilar opacities on chest x-ray.  Patient was treated empirically for sepsis of presumed pulmonary origin with vancomycin and cefepime.  ABG postintubation showed a pH of 7.173, PCO2 of 50, and PO2 of 121.  BMP significant for elevated potassium at 6.1, for which patient was given calcium gluconate.  Patient also noted to have an AKI with creatinine of 1.79 from baseline around 1.  CBC significant for leukocytosis to 16.5 and hemoglobin of 9.8 which appears to be near patient's baseline.  BNP markedly elevated at 1100.  COVID swab was obtained.  Intensivist was contacted for further evaluation and management of the patient.  They will admit patient to the ICU.   Final Clinical Impressions(s) / ED Diagnoses   Final diagnoses:  Acute respiratory failure with hypoxia and hypercapnia (HCC)  Hypotension, unspecified hypotension type     Candie Chroman, MD 2019-01-10 [id number]    Varney Biles, MD 2019-01-10 [phone]

## 2018-12-30 NOTE — H&P (Addendum)
NAME:  Destiny Shelton, MRN:  [id number], DOB:  [birth year], LOS: 0 ADMISSION DATE:  12/22/2018, CONSULTATION DATE:  12/11/2018 REFERRING MD:  Dr. Kathrynn Humble, CHIEF COMPLAINT:  Respiratory failure/ AMS  Brief History   51 yoF office patient with extensive pulmonary hx presenting from home with worsening SOB and productive cough.  Required intubation on arrival for hypoxia and GCS 4. Post-intubation hypotension requiring vasopressor support and remains hypoxic.  Pending CT chest/ head.    History of present illness   HPI obtained from medical chart review as patient is intubated on mechanical ventilation.  Husband at bedside.   74 year old female with history of former 60 pack year smoker, COPD, pulmonary hypertensin, ILD, chronic respiratory failure, chronic bronchitis and chronic Pseudomonas infection, DM, diastolic HF, anixety, depression, PAF, GERD.  She is followed in our pulmonary office by Dr. Lake Bells.    Of note, had telephone office visit earlier today 12/17/2018,  for complaint of discolored sputum and cough.  Hx of sputum color would change 2 weeks after completing antibiotic and prednisone therapy (07/2018 and 10/2018).  Not taking her nebulized tobramycin or daily azithromycin since June 2020 hospitalization for acute on chronic respiratory failure and diastolic heart failure.  Husband reports since, she is been bed bound, ongoing confusion, and uses 6-8 L oxygen at home.    She presented from home with worsening shortness of breath and altered mental status since today.  Husband denies any recent falls/ trauma.  Arrived on NRB with O2 sat 86% and GCS of 4, Temp 96.1 and initially normotensive  She was intubated and then became hypotensive post intubation despite 2 L NS bolus requiring start of vasopressor with arterial and central line placement in ER.  Labs noted for K 6.1, sCr 1.79, AG 17, BNP 1163, WBC 16.5, Hgb 9.8, COVID neg, CXR with bibasilar opacities, slightly worse on right than prior  exam in June.  Post intubation ABG  7.173/ 50 /121/ 18.7.  Antibiotics ordered.  Ordered calcium gluconate and bicarb for hyperkalemia given junctional rhythm on EKG, but not given yet.   Pending CT chest and Head.  PCCM to admit.   Past Medical History  Former smoker- 60 pack years/ quit 2016, COPD, pulmonary hypertensin, ILD, chronic respiratory failure, chronic bronchitis and chronic Pseudomonas infection, DM, HF, anxiety, depression, PAF, GERD  Significant Hospital Events   7/30 Admit  Consults:   Procedures:  7/30 ETT >> 7/30 R IJ CVC >> 7/30 L femoral aline >> 7/30 foley >>  Significant Diagnostic Tests:  7/31 CTH  >>  7/31 CT chest w/o contrast >>  Micro Data:  7/30 UC >> 7/30 BCx 2 >> 7/31 Trach asp >>  Antimicrobials:  7/31 vancomycin >> 7/31 cefepime >>  Interim history/subjective:  Currently on levophed via central line at 30 mcg/kg/min Has not required any sedation since intubation (was etomidate and roc only)  Objective   Blood pressure (!) 73/42, pulse 66, temperature (!) 96.1 F (35.6 C), temperature source Temporal, resp. rate 20, height 6' (1.829 m), weight 90 kg, SpO2 (!) 14 %.    Vent Mode: PRVC FiO2 (%):  [100 %] 100 % Set Rate:  [20 bmp] 20 bmp Vt Set:  [500 mL] 500 mL PEEP:  [5 cmH20] 5 cmH20 Plateau Pressure:  [21 cmH20-23 cmH20] 23 cmH20  No intake or output data in the 24 hours ending 12/09/2018 2345 Filed Weights   12/07/2018 2142  Weight: 90 kg   Examination:  Of note, received  rocuronium at 2149 General: Acute on chronically ill appearing elderly female on MV in NAD  HENT: ETT at 24 cm, OGT, pupils 5/reactive, absent corneals Lungs: MV supported breaths, not breathing over set rate, PIP 26, plat 21, diffuse insp wheeze throughout Cardiovascular: distant, RR, weak peripheral pulses Abdomen: obese, soft, hypo BS, foley  Extremities: cool/ dry, chronic venous stasis to LE with +1 edema, bruising to bilateral arms Neuro: unresponsive, no gag  or cough GU: foley  Resolved Hospital Problem list     Assessment & Plan:   Acute on chronic hypoxic respiratory failure, baseline 6-8L home O2 AECOPD ILD- home prednisone 5mg  Pulmonary hypertension Hx of Chronic pseudomonas infections P:  Full MV support Pending ABG Trend CXR VAP  CT chest ordered  Duonebs q4, pulmicort, brovonna Solumedrol 40mg  q 6 See below Ongoing GOC discussions  Hypotension- concerning for septic shock vs cor pulmonale vs post-intubation hypotension with pulmonary hypertension (had d-shaped septum on prior TTE in 10/2017) P:  Tele monitor Continue levophed for MAP goal > 65 Trending lactic  Checking LFTs TTE in am  Trend hs- trop/ EKG Will start solucortef for possible AI given chronic steroid use  Leukocytosis- concerning for chronic Pseudomonas infection given recent changes in sputum, cough, and worsening hypoxia - hx of moderate resistant Pseudomonas P:  Send trach aspirate Assess UA, urine strep / legionella Follow cultures Continue vanc/ cefepime Trend PCT  Acute encephalopathy- likely related to above P:  Monitor  CT Head pending (on home xarelto)  Pulmonary hypertension Acute on chronic diastolic heart failure P:  Tele monitor Hold diuresis  TTE as above  Paroxysmal atrial fibrillation P:  Tele monitoring  Hold home xarelto  Assess coags Pending CT head   AKI Hyperkalemia - s/p 2L  P:  EKG- junctional s/p temporizing hyperkalemia measures Has not received calcium or bicarb Add additional bicarb now with insulin and d50 lokelemia 10 mg now Repeat BMP in 4 hours  continue foley  Trend BMP / urinary output Replace electrolytes as indicated Avoid nephrotoxic agents, ensure adequate renal perfusion  DMT2 P:  CBG q 4 SSI sensitive   Best practice:  Diet: NPO Pain/Anxiety/Delirium protocol (if indicated): prn only  VAP protocol (if indicated): yes DVT prophylaxis: SCDS, pending CTH GI prophylaxis:  PPI Glucose control: CBG q 4/ SSI Mobility: BR Code Status:  Dr. Gilford Raid had at length discussion with husband at bedside given her chronic lung illnesses.  She was a DNR on prior admit.  Husband wants to continue full code for now and will discuss with family.   Family Communication: Husband updated at bedside. Wynetta Destiny -husband [phone] cell/ [phone] Disposition: ICU  Labs   CBC: Recent Labs  Lab 12/08/2018 2205 12/18/2018 2244  WBC 16.5*  --   HGB 9.8* 10.9*  HCT 38.9 32.0*  MCV 88.6  --   PLT 381  --     Basic Metabolic Panel: Recent Labs  Lab 12/09/2018 2205 12/11/2018 2244  NA 137 139  K 6.1* 6.3*  CL 100  --   CO2 20*  --   GLUCOSE 94  --   BUN 20  --   CREATININE 1.79*  --   CALCIUM 7.8*  --    GFR: Estimated Creatinine Clearance: 34.8 mL/min (A) (by C-G formula based on SCr of 1.79 mg/dL (H)). Recent Labs  Lab 12/14/2018 2205  WBC 16.5*    Liver Function Tests: No results for input(s): AST, ALT, ALKPHOS, BILITOT, PROT, ALBUMIN in the last 168 hours.  No results for input(s): LIPASE, AMYLASE in the last 168 hours. No results for input(s): AMMONIA in the last 168 hours.  ABG    Component Value Date/Time   PHART 7.173 (LL) 12/10/2018 2244   PCO2ART 50.0 (H) 12/19/2018 2244   PO2ART 121.0 (H) 12/21/2018 2244   HCO3 18.7 (L) 12/15/2018 2244   TCO2 20 (L) 12/14/2018 2244   ACIDBASEDEF 10.0 (H) 12/09/2018 2244   O2SAT 98.0 12/18/2018 2244     Coagulation Profile: No results for input(s): INR, PROTIME in the last 168 hours.  Cardiac Enzymes: No results for input(s): CKTOTAL, CKMB, CKMBINDEX, TROPONINI in the last 168 hours.  HbA1C: Hgb A1c MFr Bld  Date/Time Value Ref Range Status  03/02/2017 04:04 PM 5.8 (H) 4.8 - 5.6 % Final    Comment:    (NOTE) Pre diabetes:          5.7%-6.4% Diabetes:              >6.4% Glycemic control for   <7.0% adults with diabetes   05/26/2016 03:08 AM 5.9 (H) 4.8 - 5.6 % Final    Comment:    (NOTE)          Pre-diabetes: 5.7 - 6.4         Diabetes: >6.4         Glycemic control for adults with diabetes: <7.0     CBG: No results for input(s): GLUCAP in the last 168 hours.  Review of Systems:   Unable   Past Medical History  She,  has a past medical history of Adenomatous colon polyp, ALLERGIC RHINITIS, Anemia, Anxiety, Atrial tachycardia ([place]), Back pain, chronic, Carotid stenosis, CHF (congestive heart failure) ([place]), Community acquired pneumonia - Recent Admission (07/2013) (07/08/2013), COPD (chronic obstructive pulmonary disease) ([place]), Depression, Diabetes mellitus type 2 with neurological manifestations ([place]), Diabetes mellitus without complication ([place]), Diverticulosis of colon, External hemorrhoids, Fibromyalgia, Gastritis, GERD (gastroesophageal reflux disease), Headache(784.0), Hyperlipemia, Hypertension, Inappropriate sinus tachycardia, Morbidly obese ([place]), Nephrolithiasis, Neuromuscular disorder ([place]), Osteoarthritis, Osteoporosis, PAF (paroxysmal atrial fibrillation) ([place]), Polyposis coli (01/24/2013), Sleep apnea, and Urosepsis (05/26/2012).   Surgical History    Past Surgical History:  Procedure Laterality Date   [place] N/A 07/04/2015   Procedure: Right Heart Cath;  Surgeon: Larey Dresser, MD;  Location: [place] [facility];  Service: Cardiovascular;  Laterality: N/A;   CHOLECYSTECTOMY  1963   COLONOSCOPY N/A 12/21/2012   Procedure: COLONOSCOPY;  Surgeon: Gatha Mayer, MD;  Location: [facility];  Service: Endoscopy;  Laterality: N/A;   COLONOSCOPY N/A 11/17/2013   Procedure: COLONOSCOPY;  Surgeon: Gatha Mayer, MD;  Location: [facility];  Service: Endoscopy;  Laterality: N/A;   COLONOSCOPY W/ BIOPSIES AND POLYPECTOMY  07/22/2007, 04/28/2007   2009: diverticulosis, 2008: diverticulosis, adenomatous polyps   COLONOSCOPY WITH PROPOFOL N/A 10/01/2012   Procedure: COLONOSCOPY WITH PROPOFOL;  Surgeon: Gatha Mayer, MD;  Location: [facility];  Service: Endoscopy;  Laterality: N/A;   COLONOSCOPY WITH PROPOFOL N/A 10/10/2014   Procedure: COLONOSCOPY WITH PROPOFOL;  Surgeon: Gatha Mayer, MD;  Location: [facility];  Service: Endoscopy;  Laterality: N/A;   ESOPHAGOGASTRODUODENOSCOPY (EGD) WITH PROPOFOL N/A 10/01/2012   Procedure: ESOPHAGOGASTRODUODENOSCOPY (EGD) WITH PROPOFOL;  Surgeon: Gatha Mayer, MD;  Location: [facility];  Service: Endoscopy;  Laterality: N/A;   HOT HEMOSTASIS N/A 12/21/2012   Procedure: HOT HEMOSTASIS (ARGON PLASMA COAGULATION/BICAP);  Surgeon: Gatha Mayer, MD;  Location: [facility];  Service: Endoscopy;  Laterality: N/A;   HOT HEMOSTASIS N/A 10/10/2014   Procedure: HOT HEMOSTASIS (ARGON PLASMA COAGULATION/BICAP);  Surgeon: Gatha Mayer, MD;  Location: [facility];  Service: Endoscopy;  Laterality: N/A;   LEFT AND RIGHT HEART CATHETERIZATION WITH CORONARY ANGIOGRAM  12/19/2011   Procedure: LEFT AND RIGHT HEART CATHETERIZATION WITH CORONARY ANGIOGRAM;  Surgeon: Leonie Man, MD;  Location[facility] CATH LAB;  Service: Cardiovascular;;   RIGHT AND LEFT CARDIAC CATHETERIZATION  July 2013   RAP: 22 mmHg, and RVP: 46/16 mmHg, RV EDP 18; L. BP 110/11 mmHg, LVEDP 20 mmHg;  PAP 45/29 mmHg (mean 37 mmHg); PCWP 29/22 mmHg - 26 mmHg; no significant coronary disease   TEE WITHOUT CARDIOVERSION  03/24/2012   Procedure: TRANSESOPHAGEAL ECHOCARDIOGRAM (TEE);  Surgeon: Pixie Casino, MD;  Location: [place];  Service: Cardiovascular;  Laterality: N/A;  TEE with Bubble   TOTAL KNEE ARTHROPLASTY  1997   right   UPPER GASTROINTESTINAL ENDOSCOPY  04/09/2006   w/Dilation, gastritis   VIDEO BRONCHOSCOPY Bilateral 07/22/2016   Procedure: VIDEO BRONCHOSCOPY WITH FLUORO;  Surgeon: Juanito Doom, MD;  Location: [facility];  Service: Cardiopulmonary;  Laterality: Bilateral;     Social History   reports that she quit smoking about 24 years ago. Her smoking use included  cigarettes. She has a 60.00 pack-year smoking history. She has never used smokeless tobacco. She reports that she does not drink alcohol or use drugs.   Family History   Her family history includes Colon polyps in her sister; Diabetes in her brother and sister; Heart disease in her brother, father, and mother; Irritable bowel syndrome in her daughter; Skin cancer in her brother; Stroke in her brother and mother. There is no history of Colon cancer.   Allergies Allergies  Allergen Reactions   Aspirin Shortness Of Breath   Nsaids Shortness Of Breath     Home Medications  Prior to Admission medications   Medication Sig Start Date End Date Taking? Authorizing Provider  acetaminophen (TYLENOL) 500 MG tablet Take 1 tablet (500 mg total) by mouth every 6 (six) hours as needed for mild pain or headache. 02/01/17   Arrien, Jimmy Picket, MD  albuterol (PROVENTIL) (2.5 MG/3ML) 0.083% nebulizer solution USE 1 VIAL VIA NEBULIZER EVERY 6 HOURS AS NEEDED FOR WHEEZING OR SHORTNESS OF BREATH Patient taking differently: Take 2.5 mg by nebulization every 6 (six) hours as needed for wheezing or shortness of breath.  05/03/18   Juanito Doom, MD  ALPRAZolam Duanne Moron) 0.5 MG tablet Take 0.25-0.5 mg by mouth daily as needed for anxiety.  02/06/17   [provider]  atorvastatin (LIPITOR) 40 MG tablet Take 40 mg by mouth at bedtime.  06/06/13   [provider]  benzonatate (TESSALON) 200 MG capsule Take 600 mg by mouth at bedtime.     [provider]  buPROPion (WELLBUTRIN XL) 300 MG 24 hr tablet Take 300 mg by mouth daily. 03/03/16   [provider]  DALIRESP 500 MCG TABS tablet Take 500 mcg by mouth daily. 09/03/18   [provider]  DULoxetine (CYMBALTA) 60 MG capsule Take 60 mg by mouth daily.     [provider]  gabapentin (NEURONTIN) 600 MG tablet Take 600 mg by mouth 3 (three) times daily.     [provider]  Guaifenesin (MUCINEX MAXIMUM  STRENGTH) 1200 MG TB12 Take 1,200 mg by mouth 2 (two) times daily as needed (for cough or congestion).     [provider]  HYDROcodone-acetaminophen (NORCO/VICODIN) 5-325 MG per tablet Take 1 tablet by mouth See admin instructions. EVERY 4-6 HOURS AS NEEDED FOR PAIN OR SHORTNESS OF BREATH    [provider]  insulin aspart (NOVOLOG FLEXPEN) 100 UNIT/ML FlexPen Inject 5 Units into the skin 3 (three) times daily as needed for high blood sugar.    [provider]  insulin glargine (LANTUS) 100 UNIT/ML injection Inject 0.66 mLs (66 Units total) into the skin daily before breakfast. Patient taking differently: Inject 64 Units into the skin daily before breakfast.  01/24/16   Janece Canterbury, MD  iron polysaccharides (NIFEREX) 150 MG capsule Take 1 capsule (150 mg total) by mouth 2 (two) times daily. Patient taking differently: Take 150 mg by mouth daily.  01/24/16   Janece Canterbury, MD  levalbuterol [facility] HFA) 45 MCG/ACT inhaler Inhale 1-2 puffs into the lungs every 4 (four) hours as needed for wheezing. Patient taking differently: Inhale 2 puffs into the lungs every 4 (four) hours as needed for wheezing or shortness of breath.  06/01/12   Marton Redwood, MD  Multiple Vitamin (MULTIVITAMIN WITH MINERALS) TABS tablet Take 1 tablet by mouth daily.    [provider]  omeprazole (PRILOSEC OTC) 20 MG tablet Take 20 mg by mouth daily.     [provider]  OXYGEN Inhale 5-8 L into the lungs See admin instructions. 5 liters when at rest and 8 liters when ambulatory    [provider]  polyethylene glycol (MIRALAX / GLYCOLAX) packet Take 17 g by mouth daily. Mix in 8 oz liquid and drink    [provider]  polyvinyl alcohol (ARTIFICIAL TEARS) 1.4 % ophthalmic solution Place 1 drop into both eyes 2 (two) times daily.     [provider]  potassium chloride (K-DUR) 10 MEQ tablet TAKE 3 TABLETS BY MOUTH TWICE DAILY. Patient taking differently:  Take 30 mEq by mouth daily.  10/04/18   Larey Dresser, MD  SPIRIVA HANDIHALER 18 MCG inhalation capsule INHALE THE CONTENTS OF 1 CAPSULE VIA HANDIHALER BY MOUTH EVERY MORNING Patient taking differently: Place 18 mcg into inhaler and inhale daily.  08/05/18   Juanito Doom, MD  SYMBICORT 160-4.5 MCG/ACT inhaler INHALE 2 PUFFS BY MOUTH TWICE DAILY Patient taking differently: Inhale 2 puffs into the lungs 2 (two) times a day.  07/16/18   Juanito Doom, MD  tobramycin, PF, (TOBI) 300 MG/5ML nebulizer solution Take 5 mLs (300 mg total) by nebulization 2 (two) times daily. Every other month DX: J44.9 01/20/18   Juanito Doom, MD  tolterodine (DETROL LA) 4 MG 24 hr capsule Take 4 mg by mouth daily as needed. Overactive bladder 08/06/18   [provider]  torsemide (DEMADEX) 20 MG tablet Take 80 mg (4 tabs) in am and 40 mg (2 tabs) in pm Patient taking differently: 40-80 mg See admin instructions. Take 80 mg (4 tabs) in am and 40 mg (2 tabs) in pm 01/11/18   Larey Dresser, MD  verapamil (VERELAN PM) 360 MG 24 hr capsule TAKE 1 CAPSULE(360 MG) BY MOUTH DAILY Patient taking differently: Take 360 mg by mouth daily.  09/02/18   Larey Dresser, MD  XARELTO 20 MG TABS tablet Take 20 mg by mouth daily with supper. 02/22/17   [provider]     Critical care time: 81 mins    Kennieth Rad, MSN, AGACNP-BC [facility] Pgr: [phone] or if no answer [phone] 01-11-2019, 12:26 AM

## 2018-12-30 NOTE — ED Notes (Signed)
Husband is waiting in Consultation Room A

## 2018-12-30 NOTE — Assessment & Plan Note (Signed)
Plan: Continue diuretics Resume weighing yourself daily Close follow-up with cardiology Follow-up with our office in 2 to 4 weeks Likely needs lab work, chest x-ray and EKG at next office visit

## 2018-12-30 NOTE — ED Triage Notes (Signed)
Patient arrived with EMS from home reports worsening SOB onset today with altered LOC , NRB mask at arrival , O2 sat= 86% at arrival , EDP will intubate pt.

## 2018-12-30 NOTE — Assessment & Plan Note (Signed)
Plan: Continue oxygen therapy as prescribed Resume flutter valve use Close follow-up with our office in 2 to 4 weeks

## 2018-12-30 NOTE — ED Notes (Addendum)
Patient intubated by EDP , RT applied ventilator , portable chest x-ray ordered. Patient placed on a monitor and pulse oximetry , IV sites intact , OGT intact .

## 2018-12-30 NOTE — ED Notes (Signed)
Patient is hypotensive 64/47 ( EDP is aware) , resident MD at bedside accessing IJ central line , NS IV bolus infusing . IV sites intact.

## 2018-12-30 NOTE — Assessment & Plan Note (Addendum)
Resume azithromycin daily  Plan: Chest x-ray, EKG, and baseline lab work at next office visit in 2 to 4 weeks

## 2018-12-30 NOTE — Addendum Note (Signed)
Addended by: Joella Prince on: 12/23/2018 09:11 AM   Modules accepted: Orders

## 2018-12-30 NOTE — Assessment & Plan Note (Addendum)
Plan: Resume tobramycin inhaled nebulized meds twice daily for the month of August, rotate every other month Resume daily azithromycin Restart flutter valve use, twice daily, 10 breaths each time Need to consider sputum culture at next office visit May need to consider referral to infectious disease if patient continues to have exacerbations despite medication adherence as 2018 sputum grew out intermediate resistance to Levaquin, ciprofloxacin and gentamicin

## 2018-12-30 NOTE — Patient Instructions (Addendum)
Severe COPD with Pseudomonas colonization  Resume azithromycin 250 mg daily Continue Symbicort 160/4.5 2 puffs twice a day Continue Spiriva Hnadihaler  puffs daily Continue prednisone 5 mg daily Resume tobramycin nebulized 300 mg twice a day every other month, this medicine helps reduce the frequency of your bronchitis episodes >>>Start today, stop during September, resume October, Stop November, keep this pattern   Use a flutter valve 10 breaths twice a day or 4 to 5 breaths 4-5 times a day to help clear mucus out Let us know if you have cough with change in mucus color or fevers or chills.  At that point you would need an antibiotic.   Worsening shortness of breath: Try to exercise as much as you can  Chronic respiratory failure with hypoxemia:  Continue using 4 to 8 L of oxygen continuously  Continue oxygen therapy as prescribed  >>>maintain oxygen saturations greater than 88 percent  >>>if unable to maintain oxygen saturations please contact the office  >>>do not smoke with oxygen  >>>can use nasal saline gel or nasal saline rinses to moisturize nose if oxygen causes dryness    Pulmonary hypertension:  Keep taking the diuretic medicines as arranged by the advanced heart failure clinic  Patient Education for Fluid Management  Do the following things EVERY DAY:   1. Weigh yourself EVERY morning after you go to the bathroom but before you eat or drink anything. Write this number down in a weight log/diary.    2. Take your medicines as prescribed. If you have concerns about your medications, please call us before you stop taking them.    3. Eat low salt foods-Limit salt (sodium) to 2000 mg per day. This will help prevent your body from holding onto fluid. Read food labels as many processed foods have a lot of sodium, especially canned goods and prepackaged meats. If you would like some assistance choosing low sodium foods, we would be happy to set you up with a  nutritionist.   4. Stay as active as you can everyday. Staying active will give you more energy and make your muscles stronger. Start with 5 minutes at a time and work your way up to 30 minutes a day. Break up your activities--do some in the morning and some in the afternoon. Start with 3 days per week and work your way up to 5 days as you can.  If you have chest pain, feel short of breath, dizzy, or lightheaded, STOP. If you don't feel better after a short rest, call 911. If you do feel better, call the office to let us know you have symptoms with exercise.   5. Limit all fluids for the day to less than 2 liters. Fluid includes all drinks, coffee, juice, ice chips, soup, jello, and all other liquids.    Return in about 4 weeks (around 01/27/2019), or if symptoms worsen or fail to improve, for Follow up with Wyn Quaker FNP-C, Follow up with Tammy Parrett  ANP-BC.   >>>needs ekg, labwork, chest xray at next office visit    Coronavirus (COVID-19) Are you at risk?  Are you at risk for the Coronavirus (COVID-19)?  To be considered HIGH RISK for Coronavirus (COVID-19), you have to meet the following criteria:  . Traveled to [place], [place], [place], [place] or [place]; or in the [place] to [place], [place], [place], or [place]; and have fever, cough, and shortness of breath within the last 2 weeks of travel OR . Been in  close contact with a person diagnosed with COVID-19 within the last 2 weeks and have fever, cough, and shortness of breath . IF YOU DO NOT MEET THESE CRITERIA, YOU ARE CONSIDERED LOW RISK FOR COVID-19.  What to do if you are HIGH RISK for COVID-19?  Marland Kitchen If you are having a medical emergency, call 911. . Seek medical care right away. Before you go to a doctor's office, urgent care or emergency department, call ahead and tell them about your recent travel, contact with someone diagnosed with COVID-19, and your symptoms. You should receive instructions from your  physician's office regarding next steps of care.  . When you arrive at healthcare provider, tell the healthcare staff immediately you have returned from visiting [place], [place], [place], [place] or [place]; or traveled in the [place] to [place], [place], [place], or [place]; in the last two weeks or you have been in close contact with a person diagnosed with COVID-19 in the last 2 weeks.   . Tell the health care staff about your symptoms: fever, cough and shortness of breath. . After you have been seen by a medical provider, you will be either: o Tested for (COVID-19) and discharged home on quarantine except to seek medical care if symptoms worsen, and asked to  - Stay home and avoid contact with others until you get your results (4-5 days)  - Avoid travel on public transportation if possible (such as bus, train, or airplane) or o Sent to the Emergency Department by EMS for evaluation, COVID-19 testing, and possible admission depending on your condition and test results.  What to do if you are LOW RISK for COVID-19?  Reduce your risk of any infection by using the same precautions used for avoiding the common cold or flu:  Marland Kitchen Wash your hands often with soap and warm water for at least 20 seconds.  If soap and water are not readily available, use an alcohol-based hand sanitizer with at least 60% alcohol.  . If coughing or sneezing, cover your mouth and nose by coughing or sneezing into the elbow areas of your shirt or coat, into a tissue or into your sleeve (not your hands). . Avoid shaking hands with others and consider head nods or verbal greetings only. . Avoid touching your eyes, nose, or mouth with unwashed hands.  . Avoid close contact with people who are sick. . Avoid places or events with large numbers of people in one location, like concerts or sporting events. . Carefully consider travel plans you have or are making. . If you are planning any travel outside or inside the [place],  visit the CDC's Travelers' Health webpage for the latest health notices. . If you have some symptoms but not all symptoms, continue to monitor at home and seek medical attention if your symptoms worsen. . If you are having a medical emergency, call 911.   [place] / e-Visit: [email or web address]         [facility]: [facility]: [phone]                   [facility]: [phone]           It is flu season:   >>> Best ways to protect herself from the flu: Receive the yearly flu vaccine, practice good hand hygiene washing with soap and also using hand sanitizer when available, eat a nutritious meals,  get adequate rest, hydrate appropriately   Please contact the office if your symptoms worsen or you have concerns that you are not improving.   Thank you for choosing [facility] for your healthcare, and for allowing [place] to partner with you on your healthcare journey. I am thankful to be able to provide care to you today.   Wyn Quaker FNP-C

## 2018-12-30 NOTE — Assessment & Plan Note (Signed)
Plan: Patient needs to resume weighing daily Continue diuretics Continue follow-up with cardiology Close follow-up with our office in 2 to 4 weeks

## 2018-12-30 NOTE — Assessment & Plan Note (Signed)
Plan: Likely would benefit from pharmacist referral if pharmacist is in office the day of patient's next office visit, this would be helpful to review nebulized meds, daily azithromycin, and inhaler use

## 2018-12-30 NOTE — Assessment & Plan Note (Signed)
Plan: Continue oxygen therapy as prescribed 

## 2018-12-30 NOTE — Assessment & Plan Note (Signed)
Plan: Continue Symbicort 160 Continue Spiriva HandiHaler Continue prednisone 10 mg daily Resume inhaled tobramycin Resume daily azithromycin Close follow-up with our office in 2 to 4 weeks with chest x-ray, EKG and lab work

## 2018-12-31 ENCOUNTER — Inpatient Hospital Stay (HOSPITAL_COMMUNITY): Payer: Medicare Other

## 2018-12-31 DIAGNOSIS — J9690 Respiratory failure, unspecified, unspecified whether with hypoxia or hypercapnia: Secondary | ICD-10-CM | POA: Insufficient documentation

## 2018-12-31 DIAGNOSIS — R570 Cardiogenic shock: Secondary | ICD-10-CM | POA: Diagnosis present

## 2018-12-31 DIAGNOSIS — G9340 Encephalopathy, unspecified: Secondary | ICD-10-CM | POA: Diagnosis present

## 2018-12-31 DIAGNOSIS — I5032 Chronic diastolic (congestive) heart failure: Secondary | ICD-10-CM | POA: Diagnosis present

## 2018-12-31 DIAGNOSIS — I2729 Other secondary pulmonary hypertension: Secondary | ICD-10-CM | POA: Diagnosis present

## 2018-12-31 DIAGNOSIS — J189 Pneumonia, unspecified organism: Secondary | ICD-10-CM | POA: Diagnosis present

## 2018-12-31 DIAGNOSIS — R6521 Severe sepsis with septic shock: Secondary | ICD-10-CM | POA: Diagnosis present

## 2018-12-31 DIAGNOSIS — E1149 Type 2 diabetes mellitus with other diabetic neurological complication: Secondary | ICD-10-CM | POA: Diagnosis present

## 2018-12-31 DIAGNOSIS — F419 Anxiety disorder, unspecified: Secondary | ICD-10-CM | POA: Diagnosis present

## 2018-12-31 DIAGNOSIS — J9622 Acute and chronic respiratory failure with hypercapnia: Secondary | ICD-10-CM | POA: Diagnosis present

## 2018-12-31 DIAGNOSIS — I2609 Other pulmonary embolism with acute cor pulmonale: Secondary | ICD-10-CM | POA: Diagnosis present

## 2018-12-31 DIAGNOSIS — Z66 Do not resuscitate: Secondary | ICD-10-CM | POA: Diagnosis present

## 2018-12-31 DIAGNOSIS — A419 Sepsis, unspecified organism: Secondary | ICD-10-CM | POA: Diagnosis present

## 2018-12-31 DIAGNOSIS — I480 Paroxysmal atrial fibrillation: Secondary | ICD-10-CM | POA: Diagnosis present

## 2018-12-31 DIAGNOSIS — R579 Shock, unspecified: Secondary | ICD-10-CM

## 2018-12-31 DIAGNOSIS — E872 Acidosis: Secondary | ICD-10-CM | POA: Diagnosis present

## 2018-12-31 DIAGNOSIS — R4020 Unspecified coma: Secondary | ICD-10-CM | POA: Diagnosis not present

## 2018-12-31 DIAGNOSIS — Z20828 Contact with and (suspected) exposure to other viral communicable diseases: Secondary | ICD-10-CM | POA: Diagnosis present

## 2018-12-31 DIAGNOSIS — Z978 Presence of other specified devices: Secondary | ICD-10-CM | POA: Diagnosis present

## 2018-12-31 DIAGNOSIS — J449 Chronic obstructive pulmonary disease, unspecified: Secondary | ICD-10-CM | POA: Diagnosis present

## 2018-12-31 DIAGNOSIS — I110 Hypertensive heart disease with heart failure: Secondary | ICD-10-CM | POA: Diagnosis present

## 2018-12-31 DIAGNOSIS — E875 Hyperkalemia: Secondary | ICD-10-CM | POA: Diagnosis present

## 2018-12-31 DIAGNOSIS — J9601 Acute respiratory failure with hypoxia: Secondary | ICD-10-CM | POA: Diagnosis not present

## 2018-12-31 DIAGNOSIS — K219 Gastro-esophageal reflux disease without esophagitis: Secondary | ICD-10-CM | POA: Diagnosis present

## 2018-12-31 DIAGNOSIS — L8990 Pressure ulcer of unspecified site, unspecified stage: Secondary | ICD-10-CM | POA: Diagnosis present

## 2018-12-31 DIAGNOSIS — R40243 Glasgow coma scale score 3-8, unspecified time: Secondary | ICD-10-CM | POA: Diagnosis present

## 2018-12-31 DIAGNOSIS — J9621 Acute and chronic respiratory failure with hypoxia: Secondary | ICD-10-CM | POA: Diagnosis present

## 2018-12-31 DIAGNOSIS — J441 Chronic obstructive pulmonary disease with (acute) exacerbation: Secondary | ICD-10-CM | POA: Diagnosis not present

## 2018-12-31 DIAGNOSIS — J811 Chronic pulmonary edema: Secondary | ICD-10-CM | POA: Diagnosis not present

## 2018-12-31 DIAGNOSIS — N179 Acute kidney failure, unspecified: Secondary | ICD-10-CM | POA: Diagnosis present

## 2018-12-31 DIAGNOSIS — F329 Major depressive disorder, single episode, unspecified: Secondary | ICD-10-CM | POA: Diagnosis present

## 2018-12-31 LAB — PATHOLOGIST SMEAR REVIEW

## 2018-12-31 LAB — URINALYSIS, ROUTINE W REFLEX MICROSCOPIC
Bilirubin Urine: NEGATIVE
Glucose, UA: NEGATIVE mg/dL
Hgb urine dipstick: NEGATIVE
Ketones, ur: NEGATIVE mg/dL
Leukocytes,Ua: NEGATIVE
Nitrite: NEGATIVE
Protein, ur: NEGATIVE mg/dL
Specific Gravity, Urine: 1.014 (ref 1.005–1.030)
pH: 5 (ref 5.0–8.0)

## 2018-12-31 LAB — GLUCOSE, CAPILLARY
Glucose-Capillary: 88 mg/dL (ref 70–99)
Glucose-Capillary: <10 mg/dL — CL (ref 70–99)
Glucose-Capillary: 30 mg/dL — CL (ref 70–99)
Glucose-Capillary: 111 mg/dL — ABNORMAL HIGH (ref 70–99)
Glucose-Capillary: 53 mg/dL — ABNORMAL LOW (ref 70–99)
Glucose-Capillary: 42 mg/dL — CL (ref 70–99)

## 2018-12-31 LAB — POCT I-STAT 7, (LYTES, BLD GAS, ICA,H+H)
Acid-base deficit: 9 mmol/L — ABNORMAL HIGH (ref 0.0–2.0)
Acid-base deficit: 13 mmol/L — ABNORMAL HIGH (ref 0.0–2.0)
Bicarbonate: 20.4 mmol/L (ref 20.0–28.0)
Bicarbonate: 16.7 mmol/L — ABNORMAL LOW (ref 20.0–28.0)
Calcium, Ion: 0.9 mmol/L — ABNORMAL LOW (ref 1.15–1.40)
Calcium, Ion: 0.82 mmol/L — CL (ref 1.15–1.40)
HCT: 33 % — ABNORMAL LOW (ref 36.0–46.0)
HCT: 31 % — ABNORMAL LOW (ref 36.0–46.0)
Hemoglobin: 11.2 g/dL — ABNORMAL LOW (ref 12.0–15.0)
Hemoglobin: 10.5 g/dL — ABNORMAL LOW (ref 12.0–15.0)
O2 Saturation: 100 %
O2 Saturation: 98 %
Patient temperature: 93.8
Potassium: 4.9 mmol/L (ref 3.5–5.1)
Potassium: 4.9 mmol/L (ref 3.5–5.1)
Sodium: 144 mmol/L (ref 135–145)
Sodium: 146 mmol/L — ABNORMAL HIGH (ref 135–145)
TCO2: 22 mmol/L (ref 22–32)
TCO2: 18 mmol/L — ABNORMAL LOW (ref 22–32)
pCO2 arterial: 52.7 mmHg — ABNORMAL HIGH (ref 32.0–48.0)
pCO2 arterial: 57.3 mmHg — ABNORMAL HIGH (ref 32.0–48.0)
pH, Arterial: 7.179 — CL (ref 7.350–7.450)
pH, Arterial: 7.071 — CL (ref 7.350–7.450)
pO2, Arterial: 290 mmHg — ABNORMAL HIGH (ref 83.0–108.0)
pO2, Arterial: 136 mmHg — ABNORMAL HIGH (ref 83.0–108.0)

## 2018-12-31 LAB — LACTIC ACID, PLASMA
Lactic Acid, Venous: 9.7 mmol/L (ref 0.5–1.9)
Lactic Acid, Venous: >11 mmol/L (ref 0.5–1.9)

## 2018-12-31 LAB — CBC
HCT: 34.8 % — ABNORMAL LOW (ref 36.0–46.0)
Hemoglobin: 8.9 g/dL — ABNORMAL LOW (ref 12.0–15.0)
MCH: 22.2 pg — ABNORMAL LOW (ref 26.0–34.0)
MCHC: 25.6 g/dL — ABNORMAL LOW (ref 30.0–36.0)
MCV: 86.8 fL (ref 80.0–100.0)
Platelets: 308 10*3/uL (ref 150–400)
RBC: 4.01 MIL/uL (ref 3.87–5.11)
RDW: 20.3 % — ABNORMAL HIGH (ref 11.5–15.5)
WBC: 24.1 10*3/uL — ABNORMAL HIGH (ref 4.0–10.5)
nRBC: 6.6 % — ABNORMAL HIGH (ref 0.0–0.2)

## 2018-12-31 LAB — ECHOCARDIOGRAM COMPLETE
Height: 72 in
Weight: 3343.94 oz

## 2018-12-31 LAB — BASIC METABOLIC PANEL
Anion gap: 19 — ABNORMAL HIGH (ref 5–15)
BUN: 19 mg/dL (ref 8–23)
CO2: 21 mmol/L — ABNORMAL LOW (ref 22–32)
Calcium: 6.9 mg/dL — ABNORMAL LOW (ref 8.9–10.3)
Chloride: 103 mmol/L (ref 98–111)
Creatinine, Ser: 1.87 mg/dL — ABNORMAL HIGH (ref 0.44–1.00)
GFR calc Af Amer: 30 mL/min — ABNORMAL LOW (ref >60)
GFR calc non Af Amer: 26 mL/min — ABNORMAL LOW (ref >60)
Glucose, Bld: <20 mg/dL — CL (ref 70–99)
Potassium: 5.4 mmol/L — ABNORMAL HIGH (ref 3.5–5.1)
Sodium: 143 mmol/L (ref 135–145)

## 2018-12-31 LAB — BLOOD GAS, ARTERIAL
Acid-base deficit: 12.2 mmol/L — ABNORMAL HIGH (ref 0.0–2.0)
Bicarbonate: 16 mmol/L — ABNORMAL LOW (ref 20.0–28.0)
FIO2: 1
MECHVT: 500 mL
O2 Saturation: 98.8 %
PEEP: 5 cmH2O
Patient temperature: 96.1
RATE: 20 resp/min
pCO2 arterial: 52.1 mmHg — ABNORMAL HIGH (ref 32.0–48.0)
pH, Arterial: 7.102 — CL (ref 7.350–7.450)
pO2, Arterial: 222 mmHg — ABNORMAL HIGH (ref 83.0–108.0)

## 2018-12-31 LAB — HEPATIC FUNCTION PANEL
ALT: 1249 U/L — ABNORMAL HIGH (ref 0–44)
AST: 2991 U/L — ABNORMAL HIGH (ref 15–41)
Albumin: 2.3 g/dL — ABNORMAL LOW (ref 3.5–5.0)
Alkaline Phosphatase: 123 U/L (ref 38–126)
Bilirubin, Direct: 0.8 mg/dL — ABNORMAL HIGH (ref 0.0–0.2)
Indirect Bilirubin: 0.9 mg/dL (ref 0.3–0.9)
Total Bilirubin: 1.7 mg/dL — ABNORMAL HIGH (ref 0.3–1.2)
Total Protein: 5.4 g/dL — ABNORMAL LOW (ref 6.5–8.1)

## 2018-12-31 LAB — TROPONIN I (HIGH SENSITIVITY)
Troponin I (High Sensitivity): 49 ng/L — ABNORMAL HIGH (ref <18)
Troponin I (High Sensitivity): 59 ng/L — ABNORMAL HIGH (ref <18)

## 2018-12-31 LAB — MAGNESIUM
Magnesium: 3 mg/dL — ABNORMAL HIGH (ref 1.7–2.4)
Magnesium: 2.7 mg/dL — ABNORMAL HIGH (ref 1.7–2.4)

## 2018-12-31 LAB — MRSA PCR SCREENING: MRSA by PCR: NEGATIVE

## 2018-12-31 LAB — STREP PNEUMONIAE URINARY ANTIGEN: Strep Pneumo Urinary Antigen: NEGATIVE

## 2018-12-31 LAB — PHOSPHORUS
Phosphorus: 8 mg/dL — ABNORMAL HIGH (ref 2.5–4.6)
Phosphorus: 8 mg/dL — ABNORMAL HIGH (ref 2.5–4.6)

## 2018-12-31 LAB — PROCALCITONIN: Procalcitonin: 0.49 ng/mL

## 2018-12-31 MED ORDER — NOREPINEPHRINE 4 MG/250ML-% IV SOLN
INTRAVENOUS | Status: AC
  Filled 2018-12-31: qty 250

## 2018-12-31 MED ORDER — INSULIN ASPART 100 UNIT/ML IV SOLN: 10.0000 [IU] | Freq: Once | INTRAVENOUS | Status: DC

## 2018-12-31 MED ORDER — NOREPINEPHRINE 4 MG/250ML-% IV SOLN
0.0000 ug/min | INTRAVENOUS | Status: DC
  Administered 2018-12-31: 60 ug/min via INTRAVENOUS
  Administered 2018-12-31 (×2): 40 ug/min via INTRAVENOUS
  Administered 2018-12-31: 06:00:00 60 ug/min via INTRAVENOUS
  Filled 2018-12-31: qty 250
  Filled 2018-12-31: qty 500

## 2018-12-31 MED ORDER — DEXTROSE 50 % IV SOLN
INTRAVENOUS | Status: AC
  Administered 2018-12-31: 50 mL
  Filled 2018-12-31: qty 50

## 2018-12-31 MED ORDER — ALBUTEROL SULFATE (2.5 MG/3ML) 0.083% IN NEBU: 2.5000 mg | INHALATION_SOLUTION | RESPIRATORY_TRACT | Status: DC | PRN

## 2018-12-31 MED ORDER — SODIUM BICARBONATE 8.4 % IV SOLN
INTRAVENOUS | Status: AC
  Administered 2018-12-31: 08:00:00
  Filled 2018-12-31: qty 50

## 2018-12-31 MED ORDER — CHLORHEXIDINE GLUCONATE CLOTH 2 % EX PADS: 6.0000 | MEDICATED_PAD | Freq: Every day | CUTANEOUS | Status: DC

## 2018-12-31 MED ORDER — PHENYLEPHRINE HCL-NACL 10-0.9 MG/250ML-% IV SOLN
INTRAVENOUS | Status: AC
  Administered 2018-12-31: 80 ug/min via INTRAVENOUS
  Filled 2018-12-31: qty 250

## 2018-12-31 MED ORDER — BUDESONIDE 0.5 MG/2ML IN SUSP
0.5000 mg | Freq: Two times a day (BID) | RESPIRATORY_TRACT | Status: DC
  Administered 2018-12-31 (×2): 0.5 mg via RESPIRATORY_TRACT
  Filled 2018-12-31 (×3): qty 2

## 2018-12-31 MED ORDER — DEXTROSE 50 % IV SOLN
50.0000 mL | Freq: Once | INTRAVENOUS | Status: AC
  Administered 2018-12-31: 50 mL via INTRAVENOUS
  Filled 2018-12-31: qty 50

## 2018-12-31 MED ORDER — DEXTROSE 5 % IV SOLN: INTRAVENOUS | Status: DC

## 2018-12-31 MED ORDER — SODIUM BICARBONATE 8.4 % IV SOLN
100.0000 meq | Freq: Once | INTRAVENOUS | Status: AC
  Administered 2018-12-31: 100 meq via INTRAVENOUS

## 2018-12-31 MED ORDER — HYDROCORTISONE NA SUCCINATE PF 100 MG IJ SOLR
50.0000 mg | Freq: Four times a day (QID) | INTRAMUSCULAR | Status: DC
  Administered 2018-12-31: 50 mg via INTRAVENOUS
  Filled 2018-12-31: qty 2

## 2018-12-31 MED ORDER — INSULIN ASPART 100 UNIT/ML ~~LOC~~ SOLN: 0.0000 [IU] | SUBCUTANEOUS | Status: DC

## 2018-12-31 MED ORDER — MORPHINE 100MG IN NS 100ML (1MG/ML) PREMIX INFUSION: 1.0000 mg/h | INTRAVENOUS | Status: DC

## 2018-12-31 MED ORDER — FAMOTIDINE IN NACL 20-0.9 MG/50ML-% IV SOLN: 20.0000 mg | INTRAVENOUS | Status: DC

## 2018-12-31 MED ORDER — SODIUM CHLORIDE 0.9 % IV SOLN
2.0000 g | Freq: Two times a day (BID) | INTRAVENOUS | Status: DC
  Administered 2018-12-31: 11:00:00 2 g via INTRAVENOUS
  Filled 2018-12-31: qty 2

## 2018-12-31 MED ORDER — SODIUM ZIRCONIUM CYCLOSILICATE 10 G PO PACK
10.0000 g | PACK | Freq: Once | ORAL | Status: AC
  Administered 2018-12-31: 10 g via ORAL
  Filled 2018-12-31: qty 1

## 2018-12-31 MED ORDER — CHLORHEXIDINE GLUCONATE 0.12% ORAL RINSE (MEDLINE KIT)
15.0000 mL | Freq: Two times a day (BID) | OROMUCOSAL | Status: DC
  Administered 2018-12-31 (×2): 15 mL via OROMUCOSAL

## 2018-12-31 MED ORDER — ORAL CARE MOUTH RINSE
15.0000 mL | OROMUCOSAL | Status: DC
  Administered 2018-12-31 (×4): 15 mL via OROMUCOSAL

## 2018-12-31 MED ORDER — PANTOPRAZOLE SODIUM 40 MG IV SOLR
40.0000 mg | INTRAVENOUS | Status: DC
  Administered 2018-12-31: 40 mg via INTRAVENOUS
  Filled 2018-12-31: qty 40

## 2018-12-31 MED ORDER — PHENYLEPHRINE HCL-NACL 10-0.9 MG/250ML-% IV SOLN
0.0000 ug/min | INTRAVENOUS | Status: DC
  Administered 2018-12-31: 08:00:00 80 ug/min via INTRAVENOUS
  Filled 2018-12-31: qty 250

## 2018-12-31 MED ORDER — LACTATED RINGERS IV BOLUS
1000.0000 mL | Freq: Once | INTRAVENOUS | Status: AC
  Administered 2018-12-31: 1000 mL via INTRAVENOUS

## 2018-12-31 MED ORDER — SODIUM BICARBONATE 8.4 % IV SOLN: 50.0000 meq | Freq: Once | INTRAVENOUS | Status: AC

## 2018-12-31 MED ORDER — METHYLPREDNISOLONE SODIUM SUCC 40 MG IJ SOLR: 40.0000 mg | Freq: Four times a day (QID) | INTRAMUSCULAR | Status: DC

## 2018-12-31 MED ORDER — NOREPINEPHRINE 16 MG/250ML-% IV SOLN
0.0000 ug/min | INTRAVENOUS | Status: DC
  Administered 2018-12-31: 60 ug/min via INTRAVENOUS
  Filled 2018-12-31 (×2): qty 250

## 2018-12-31 MED ORDER — STERILE WATER FOR INJECTION IV SOLN
INTRAVENOUS | Status: DC
  Administered 2018-12-31: 02:00:00 via INTRAVENOUS
  Filled 2018-12-31 (×2): qty 850

## 2018-12-31 MED ORDER — SODIUM BICARBONATE 8.4 % IV SOLN
100.0000 meq | Freq: Once | INTRAVENOUS | Status: AC
  Administered 2018-12-31: 100 meq via INTRAVENOUS
  Filled 2018-12-31: qty 100

## 2018-12-31 MED ORDER — ARFORMOTEROL TARTRATE 15 MCG/2ML IN NEBU
15.0000 ug | INHALATION_SOLUTION | Freq: Two times a day (BID) | RESPIRATORY_TRACT | Status: DC
  Administered 2018-12-31 (×2): 15 ug via RESPIRATORY_TRACT
  Filled 2018-12-31 (×3): qty 2

## 2018-12-31 MED ORDER — HYDROCORTISONE NA SUCCINATE PF 100 MG IJ SOLR
100.0000 mg | Freq: Once | INTRAMUSCULAR | Status: AC
  Administered 2018-12-31: 05:00:00 100 mg via INTRAVENOUS
  Filled 2018-12-31: qty 2

## 2018-12-31 MED ORDER — IPRATROPIUM-ALBUTEROL 0.5-2.5 (3) MG/3ML IN SOLN
3.0000 mL | RESPIRATORY_TRACT | Status: DC
  Administered 2018-12-31 (×3): 3 mL via RESPIRATORY_TRACT
  Filled 2018-12-31 (×3): qty 3

## 2018-12-31 MED ORDER — PHENYLEPHRINE HCL-NACL 40-0.9 MG/250ML-% IV SOLN
0.0000 ug/min | INTRAVENOUS | Status: DC
  Administered 2018-12-31: 100 ug/min via INTRAVENOUS
  Filled 2018-12-31 (×2): qty 250

## 2018-12-31 MED ORDER — VANCOMYCIN HCL 10 G IV SOLR: 1500.0000 mg | INTRAVENOUS | Status: DC

## 2018-12-31 MED ORDER — DEXTROSE 10 % IV SOLN
INTRAVENOUS | Status: DC
  Administered 2018-12-31: 07:00:00 via INTRAVENOUS

## 2019-01-01 LAB — URINE CULTURE: Culture: <10000 — AB

## 2019-01-01 NOTE — Progress Notes (Signed)
[facility] Progress Note Patient Name: Destiny Shelton DOB: [birth year] MRN: [id number]   Date of Service  01-16-19  HPI/Events of Note  ABG on 100%/PRVC 20/TV 500/P 5 = 7.102/52.1/222/16.  eICU Interventions  Will order: 1. Increase PRVC rate to 32.  2. NaHCO3 100 meq IV now.  3. NaHCO3 IV infusion to run at 50 mL/hour.  4. Repeat ABG at 4 AM.      Intervention Category Major Interventions: Acid-Base disturbance - evaluation and management;Respiratory failure - evaluation and management  Mackenna Kamer Eugene Jan 16, 2019, 1:07 AM

## 2019-01-01 NOTE — Progress Notes (Signed)
Pharmacy Antibiotic Note  Destiny Shelton is a 73 y.o. female admitted on 12/28/2018 with pneumonia. Patient is known to be colonized with pseudomonas. Pharmacy has been consulted for Vancomycin and Cefepime dosing.  WBC 24.1 trending up, currently hypothermic (Tmax 96.1). Scr 1.87 trending up as well. She received one dose of cefepime and a loading dose of vancomycin overnight.  Plan: Cefepime 2 g IV Q 12 hrs Vancomycin 1500 mg IV Q 36 hrs. Goal AUC 400-550. Expected AUC: 493.6 SCr used: 1.87 Monitor cultures and clinical progression   Height: 6' (182.9 cm) Weight: 208 lb 15.9 oz (94.8 kg) IBW/kg (Calculated) : 73.1  Temp (24hrs), Avg:94.5 F (34.7 C), Min:93.7 F (34.3 C), Max:96.1 F (35.6 C)  Recent Labs  Lab 12/20/2018 2205 2019-01-21 0021 01/21/19 0213  WBC 16.5*  --  24.1*  CREATININE 1.79*  --  1.87*  LATICACIDVEN  --  9.7*  --     Estimated Creatinine Clearance: 34.1 mL/min (A) (by C-G formula based on SCr of 1.87 mg/dL (H)).    Allergies  Allergen Reactions  . Aspirin Shortness Of Breath  . Nsaids Shortness Of Breath    Antimicrobials this admission: Cefepime 7/30 >>  Vancomycin 7/31 >>   Dose adjustments this admission: N/A  Microbiology results: 7/30 BCx: pend 7/30 UCx: pend  7/31 trach: mod GNR, few NPR  7/31 MRSA PCR: pend 7/30 COVID: neg  Richardine Service, PharmD PGY1 Pharmacy Resident Phone: [phone] 2019/01/21  8:37 AM  Please check [email or web address] for unit-specific pharmacy phone numbers.

## 2019-01-01 NOTE — Progress Notes (Signed)
   01/28/19 1500  Clinical Encounter Type  Visited With Family;Health care provider  Visit Type Initial;Spiritual support;Psychological support;Death  Referral From Nurse  Spiritual Encounters  Spiritual Needs Grief support;Emotional;Prayer  Stress Factors  Family Stress Factors Major life changes;Loss   Met w/ 2 daughters in waiting room, they expressed that were still in shock.  Met w/ them when they came to bedside after tubes and wires removed from pt post-death.  At their request, prayed for pt to be received into God's care and for comfort for those who loved pt, including daughters.  Left family to have some private moments for goodbye.  Spoke w/ RNs [place] and [place] after.  Pls pg if add'l support desired.  Myra Gianotti resident, [phone]

## 2019-01-01 NOTE — Procedures (Signed)
Extubation Procedure Note  Patient Details:   Name: Destiny Shelton DOB: 1945-10-17 MRN: [id number]   Airway Documentation:    Vent end date: 01/05/2019 Vent end time: 1220   Evaluation  O2 sats: currently acceptable Complications: No apparent complications Patient did tolerate procedure well. Bilateral Breath Sounds: Rhonchi   No, Pt passed while on ventilator.  Extubated   Gonzella Lex 2019-01-05, 12:30 PM

## 2019-01-01 NOTE — Progress Notes (Signed)
[place] conversation:   Spoke with two daughters at patient's bedside  Explained clinical condition and prognosis Daughters stated that there mother was very weak at home and they have had to help her off the commode. She has had episodes where she passes out with exertion.  We discussed out current interventions on board We also discussed her decreased mental status and her tenuous hemodynamics secondary to suspected RV failure.  GOC clarified: 1. Continue mechanical ventilation 2. Continue on vasopressors 3. Should pt wake up or have a change her in clinical status they would like to be notified 4. In the event of cardiac arrest they do not want compressions/CPR or push ACLS meds or defibrillation. 5. If her clinical condition should worsen they will consider comfort care at that time.   Signed Dr Seward [facility] Loc[facility]

## 2019-01-01 NOTE — Progress Notes (Signed)
Patient transported to CT and back to 2M07 on ventilator with no apparent complications.

## 2019-01-01 NOTE — Progress Notes (Signed)
eLink Physician-Brief Progress Note Patient Name: Destiny Shelton DOB: July 20, 1945 MRN: [id number]   Date of Service  01-04-19  HPI/Events of Note  74 yoF office patient with extensive pulmonary hx presenting from home with worsening SOB and productive cough.  Required intubation on arrival for hypoxia and GCS 4. Post-intubation hypotension requiring vasopressor support and remains hypoxic. Patient arrives on ICU hypotensive. BP = 86/37 with MAP = 53. Sat = 93%.  eICU Interventions  Will order: 1. Increase ceiling on Norepinephrine IV infusion to 60 mcg/min. 2. NaHCO3 100 meq IV now.  3. Monitor CVP now and Q 4 hours.      Intervention Category Evaluation Type: New Patient Evaluation  Sommer,Steven Eugene 01-04-2019, 2:10 AM

## 2019-01-01 NOTE — Progress Notes (Signed)
PCCM Progress Note  Called to bedside to evaluate Ms [facility] for progressive shock.   73 year old chronically ill woman followed in our office for chronic hypoxic and hypercapnic respiratory failure in the setting of severe COPD (steroid-dependent), superimposed interstitial lung disease.  She has severe secondary pulmonary hypertension as well as diastolic dysfunction.  Known to be colonized with Pseudomonas.  Progressively debilitated per family's reports.  She was brought to the emergency department with acute encephalopathy and progressive respiratory distress.  She was intubated and ventilated.  Since that time she has had progressive shock, consistent with decompensated right heart failure with associated hepatic congestion.  Now on high-dose norepinephrine, added phenylephrine and receiving volume.  Being ventilated with optimal settings to avoid auto PEEP.  Despite this she has a progressive mixed acidosis and has not achieved hemodynamic stability.  She is on a bicarbonate infusion and empiric antibiotics, stress dose steroids been initiated  CT scan of the chest done at 6:00 shows no evidence of pulmonary embolism, some bibasilar interstitial changes, a new right basilar area of consolidation and associated small effusion.  Vitals:   January 27, 2019 0700 Jan 27, 2019 0804 01-27-2019 0805 January 27, 2019 0806  BP: (!) 85/55     Pulse: 72     Resp: 16     Temp:      TempSrc:      SpO2: 93% 91% 90% 90%  Weight:      Height:      Critically ill-appearing woman, ventilated and sedated.  She is unresponsive to voice, stimulation.  Lungs are coarse bilaterally with a prolonged expiratory phase.  Current vent settings PRVC 580x16, 0.80 + PEEP 0. No Auto-PEEP detected.  Heart is regular, without a murmur.  Abdomen is soft, slightly distended with no bowel sounds heard.  She has some trace bilateral lower extremity edema.   CBC Latest Ref Rng & Units 01-27-2019 Jan 27, 2019 12/09/2018  WBC 4.0 - 10.5 K/uL - 24.1(H) -   Hemoglobin 12.0 - 15.0 g/dL 11.2(L) 8.9(L) 10.9(L)  Hematocrit 36.0 - 46.0 % 33.0(L) 34.8(L) 32.0(L)  Platelets 150 - 400 K/uL - 308 -   BMP Latest Ref Rng & Units Jan 27, 2019 01/27/19 12/30/2018  Glucose 70 - 99 mg/dL - <20(LL) -  BUN 8 - 23 mg/dL - 19 -  Creatinine 0.44 - 1.00 mg/dL - 1.87(H) -  Sodium 135 - 145 mmol/L 144 143 139  Potassium 3.5 - 5.1 mmol/L 4.9 5.4(H) 6.3(HH)  Chloride 98 - 111 mmol/L - 103 -  CO2 22 - 32 mmol/L - 21(L) -  Calcium 8.9 - 10.3 mg/dL - 6.9(L) -    Acute on chronic combined respiratory failure, apparently due to right lower lobe pneumonia in the setting of severe COPD, interstitial lung disease.  She is prednisone dependent, immunosuppressed, known colonized with Pseudomonas. -Continue antibiotics as ordered for now -Attempt to adjust ventilator settings, wean FiO2 as able, may be able to add PEEP given absence of auto PEEP on her evaluation this morning -Bronchodilators as ordered  Multifactorial shock, likely principally due to right heart failure decompensated due to lung function, positive pressure.  She is on high-dose norepinephrine, phenylephrine and has received volume. -Continue pressors as ordered -Attempt to minimize impact of positive pressure on right heart function and preload.  History of atrial fibrillation -Hold off on reinitiation Xarelto for now  Diabetes mellitus -Hold home Lantus given n.p.o. status -Initiate sliding scale insulin if we are to continue with aggressive care  Acute encephalopathy -Not currently on any sedating medication but  unresponsive -Cymbalta, Neurontin, Wellbutrin, home narcotics are all on hold -We will need to add low-dose fentanyl for comfort  Discussions were undertaken with the patient's family at the time of admission, information conveyed regarding patient's instability, prognosis for survival and in particular meaningful survival that would not mean vent dependence or institutionalization.  Decision  was made to continue current therapies but to defer CPR, ACLS.  Given the patient's instability her husband has been called to bedside, allowed to visit.  I have asked have spoken with him at bedside this morning, have explained that the chances for meaningful survival here are exceedingly poor, that she is not tolerating positive pressure (as we would predict).  I have confirmed our recommendation that she should not receive any ACLS and he agrees.  Have also noted that we are on maximal support and that escalation from here probably will not change ultimate outcome.  I recommended that we speak with his daughters, have them come to see her and that we contemplate a formal withdrawal of care.  He is going to call them now.  Independent CC time 60 minutes   Baltazar Apo, MD, PhD 25-Jan-2019, 8:47 AM [facility] [phone] or if no answer [phone]

## 2019-01-01 NOTE — Progress Notes (Signed)
Sputum culture collected, sent to lab.  

## 2019-01-01 NOTE — Progress Notes (Signed)
This note also relates to the following rows which could not be included: SpO2 - Cannot attach notes to unvalidated device data  Vent changes made per MD verbal order. Recheck ABG

## 2019-01-01 NOTE — Progress Notes (Signed)
CRITICAL VALUE ALERT  Critical Value:  Lactic Acid > 11  Date & Time Notied:  01/27/2019 10:49 AM  Provider Notified: Dr. Lamonte Sakai  Orders Received/Actions taken: No further orders at this time

## 2019-01-01 DEATH — deceased

## 2019-01-02 LAB — CULTURE, RESPIRATORY W GRAM STAIN

## 2019-01-02 LAB — LEGIONELLA PNEUMOPHILA SEROGP 1 UR AG: L. pneumophila Serogp 1 Ur Ag: NEGATIVE

## 2019-01-03 ENCOUNTER — Telehealth: Payer: Self-pay | Admitting: Emergency Medicine

## 2019-01-03 NOTE — Telephone Encounter (Incomplete)
01/03/2019  I received D/C From [facility], I will have it delivered to Dr. Lamonte Sakai at the pulmonary office tomorrow since it is already 4:00 pm when we received it. PWR

## 2019-01-05 ENCOUNTER — Telehealth: Payer: Self-pay

## 2019-01-05 LAB — CULTURE, BLOOD (ROUTINE X 2)
Culture: NO GROWTH
Culture: NO GROWTH

## 2019-01-05 NOTE — Telephone Encounter (Signed)
On 01/05/2019 Received signed back from Doctor Byrum.  I called the funeral home to let them know the dc is ready for pickup.

## 2019-01-27 ENCOUNTER — Ambulatory Visit: Payer: Medicare Other | Admitting: Pulmonary Disease

## 2019-02-01 DIAGNOSIS — R7401 Elevation of levels of liver transaminase levels: Secondary | ICD-10-CM | POA: Diagnosis present

## 2019-02-01 DIAGNOSIS — J9601 Acute respiratory failure with hypoxia: Secondary | ICD-10-CM | POA: Diagnosis present

## 2019-02-01 DIAGNOSIS — I2609 Other pulmonary embolism with acute cor pulmonale: Secondary | ICD-10-CM | POA: Diagnosis present

## 2019-02-01 DIAGNOSIS — J9602 Acute respiratory failure with hypercapnia: Secondary | ICD-10-CM | POA: Diagnosis present

## 2019-02-01 DIAGNOSIS — K761 Chronic passive congestion of liver: Secondary | ICD-10-CM | POA: Diagnosis present

## 2019-02-01 DIAGNOSIS — R570 Cardiogenic shock: Secondary | ICD-10-CM | POA: Diagnosis present

## 2019-03-03 NOTE — Death Summary Note (Signed)
  DEATH SUMMARY   Patient Details  Name: Destiny Shelton MRN: [id number] DOB: 06-08-1945  Admission/Discharge Information   Admit Date:  01/18/19  Date of Death: Date of Death: 2019-01-19  Time of Death: Time of Death: 09/14/1222  Length of Stay: 1  Referring Physician: Marton Redwood, MD   Reason(s) for Hospitalization  Encephalopathy and respiratory distress  Diagnoses  Preliminary cause of death:   Acute on chronic respiratory failure  Secondary Diagnoses (including complications and co-morbidities):  Principal Problem:   Acute respiratory failure with hypoxia and hypercapnia ([place]) Active Problems:   Diabetes mellitus, type II, insulin dependent ([place])   Essential hypertension   Acute encephalopathy   PAF (paroxysmal atrial fibrillation) (HCC)   Shock, septic ([place])   Morbid obesity (HCC)   High anion gap metabolic acidosis   Acute renal failure (ARF) (HCC)   COPD (chronic obstructive pulmonary disease) (HCC)   ILD (interstitial lung disease) (HCC)   Pseudomonas aeruginosa colonization   Endotracheal tube present   Pressure injury of skin   Cor pulmonale, acute (HCC)   Hepatic congestion   Transaminitis   Shock, cardiogenic [facility])   Brief Hospital Course (including significant findings, care, treatment, and services provided and events leading to death)  [name] is a 73 y.o. year old female with a history of multifactorial chronic hypoxic and hypercapnic respiratory failure due to severe COPD (on chronic steroids), superimposed interstitial lung disease.  She had severe secondary pulmonary hypertension due to this and hypertension with chronic diastolic CHF.  She was noted to be respiratory colonized with Pseudomonas.  She was admitted on January 18, 2023 with acute encephalopathy and progressive respiratory distress.  Her family reported that she been progressively debilitated over the last several weeks.  She required immediate support including mechanical ventilation.  Pressors  were initiated for shock.  She had signs of hypoperfusion, transaminitis with hepatic congestion, acute renal failure.  All consistent with decompensated right heart failure and possible superimposed septic shock.  She was treated with high-dose norepinephrine, phenylephrine.  She was ventilated to avoid auto PEEP and prevent ventilator induced hypotension.  Bicarbonate infusion was initiated as were empiric antibiotics and stress dose steroids.  Despite all of the support unfortunately her hemodynamics and acidosis did not stabilize.  Discussed around take with the patient's family regarding her critical illness, poor prognosis for a meaningful recovery.  Her family was able to get to the hospital to visit at bedside.  Unfortunately despite all aggressive support patient never stabilized and she expired on 01-19-2019.     Rose Fillers Kaelin Bonelli 02/01/2019, 1:53 PM
